# Patient Record
Sex: Male | Born: 1935 | Hispanic: No | Marital: Married | Smoking: Former smoker
Health system: Southern US, Community
[De-identification: ages and names within clinical notes are randomized; demographics above are authoritative.]

## PROBLEM LIST (undated history)

## (undated) DIAGNOSIS — N189 Chronic kidney disease, unspecified: Secondary | ICD-10-CM

## (undated) DIAGNOSIS — Z9289 Personal history of other medical treatment: Secondary | ICD-10-CM

## (undated) DIAGNOSIS — C801 Malignant (primary) neoplasm, unspecified: Secondary | ICD-10-CM

## (undated) DIAGNOSIS — I219 Acute myocardial infarction, unspecified: Secondary | ICD-10-CM

## (undated) DIAGNOSIS — Z8619 Personal history of other infectious and parasitic diseases: Secondary | ICD-10-CM

## (undated) DIAGNOSIS — E119 Type 2 diabetes mellitus without complications: Secondary | ICD-10-CM

## (undated) DIAGNOSIS — I739 Peripheral vascular disease, unspecified: Secondary | ICD-10-CM

## (undated) DIAGNOSIS — M109 Gout, unspecified: Secondary | ICD-10-CM

## (undated) DIAGNOSIS — I5189 Other ill-defined heart diseases: Secondary | ICD-10-CM

## (undated) DIAGNOSIS — K219 Gastro-esophageal reflux disease without esophagitis: Secondary | ICD-10-CM

## (undated) DIAGNOSIS — N183 Chronic kidney disease, stage 3 unspecified: Secondary | ICD-10-CM

## (undated) DIAGNOSIS — E785 Hyperlipidemia, unspecified: Secondary | ICD-10-CM

## (undated) DIAGNOSIS — R55 Syncope and collapse: Secondary | ICD-10-CM

## (undated) DIAGNOSIS — T7840XA Allergy, unspecified, initial encounter: Secondary | ICD-10-CM

## (undated) DIAGNOSIS — G5600 Carpal tunnel syndrome, unspecified upper limb: Secondary | ICD-10-CM

## (undated) DIAGNOSIS — F03A0 Unspecified dementia, mild, without behavioral disturbance, psychotic disturbance, mood disturbance, and anxiety: Secondary | ICD-10-CM

## (undated) DIAGNOSIS — F0390 Unspecified dementia without behavioral disturbance: Secondary | ICD-10-CM

## (undated) DIAGNOSIS — M1990 Unspecified osteoarthritis, unspecified site: Secondary | ICD-10-CM

## (undated) DIAGNOSIS — C61 Malignant neoplasm of prostate: Secondary | ICD-10-CM

## (undated) DIAGNOSIS — D649 Anemia, unspecified: Secondary | ICD-10-CM

## (undated) DIAGNOSIS — I10 Essential (primary) hypertension: Secondary | ICD-10-CM

## (undated) DIAGNOSIS — K5792 Diverticulitis of intestine, part unspecified, without perforation or abscess without bleeding: Secondary | ICD-10-CM

## (undated) DIAGNOSIS — I2510 Atherosclerotic heart disease of native coronary artery without angina pectoris: Secondary | ICD-10-CM

## (undated) HISTORY — DX: Syncope and collapse: R55

## (undated) HISTORY — DX: Diverticulitis of intestine, part unspecified, without perforation or abscess without bleeding: K57.92

## (undated) HISTORY — DX: Other ill-defined heart diseases: I51.89

## (undated) HISTORY — PX: OTHER SURGICAL HISTORY: SHX169

## (undated) HISTORY — DX: Allergy, unspecified, initial encounter: T78.40XA

## (undated) HISTORY — PX: MENISCUS REPAIR: SHX5179

## (undated) HISTORY — DX: Personal history of other medical treatment: Z92.89

## (undated) HISTORY — DX: Atherosclerotic heart disease of native coronary artery without angina pectoris: I25.10

## (undated) HISTORY — DX: Anemia, unspecified: D64.9

## (undated) HISTORY — PX: CARPAL TUNNEL RELEASE: SHX101

## (undated) HISTORY — DX: Chronic kidney disease, stage 3 unspecified: N18.30

## (undated) HISTORY — DX: Chronic kidney disease, unspecified: N18.9

## (undated) HISTORY — DX: Unspecified osteoarthritis, unspecified site: M19.90

## (undated) HISTORY — DX: Peripheral vascular disease, unspecified: I73.9

## (undated) HISTORY — PX: TONSILLECTOMY: SHX5217

## (undated) HISTORY — DX: Carpal tunnel syndrome, unspecified upper limb: G56.00

## (undated) HISTORY — DX: Malignant neoplasm of prostate: C61

## (undated) HISTORY — DX: Acute myocardial infarction, unspecified: I21.9

## (undated) HISTORY — PX: APPENDECTOMY: SHX54

## (undated) HISTORY — PX: ANTERIOR CRUCIATE LIGAMENT REPAIR: SHX115

## (undated) HISTORY — DX: Personal history of other infectious and parasitic diseases: Z86.19

## (undated) NOTE — *Deleted (*Deleted)
Stroke Discharge Summary  Patient ID: Nazar Kuan   MRN: [id number]      DOB: [birth year]  Date of Admission: 12/30/2019 Date of Discharge: 01/07/2020  Attending Physician:  Micki Riley, MD, Stroke MD Consultant(s):   Genevive Bi, MD (  cardiology ), Helmut Muster RN (WOC) Patient's PCP:  Lynnda Child, MD  DISCHARGE DIAGNOSIS:  Principal Problem:   Acute ischemic stroke (HCC) R MCA w/ R M1 occlusion s/p tPA, embolic vs large vessel disease source Active Problems:   Hypertension   DM (diabetes mellitus) type II controlled with renal manifestation (HCC)   Chronic kidney disease, stage 3b (HCC)   Dementia (HCC)   Bradycardia   Peripheral neuropathy   Hyperlipidemia   Hx of myocardial infarction   Hx of malignant neoplasm of prostate   Chronic pain syndrome   Coronary artery disease involving native coronary artery of native heart without angina pectoris   Peripheral vascular disease, unspecified (HCC)   CAD in native artery   Elevated troponin level   DNR (do not resuscitate) discussion   Anemia due to stage 3b chronic kidney disease (HCC)   Necrotic toes (HCC)   Atherosclerosis of native arteries of extremities with gangrene, bilateral legs (HCC)   Chronic constipation   Pressure injury of skin   Allergies as of 01/07/2020       Reactions   Penicillins Anaphylaxis   Reported airway compromise 50 years        Medication List     STOP taking these medications    clopidogrel 75 MG tablet Commonly known as: PLAVIX   hydrochlorothiazide 12.5 MG tablet Commonly known as: HYDRODIURIL   losartan 100 MG tablet Commonly known as: COZAAR   oxyCODONE-acetaminophen 5-325 MG tablet Commonly known as: PERCOCET/ROXICET       TAKE these medications    alendronate 70 MG tablet Commonly known as: FOSAMAX Take 70 mg by mouth every Saturday.   allopurinol 100 MG tablet Commonly known as: ZYLOPRIM TAKE 1 TABLET BY MOUTH EVERY DAY   amLODipine 10 MG  tablet Commonly known as: NORVASC TAKE 1 TABLET BY MOUTH EVERY DAY   aspirin EC 81 MG tablet Take 81 mg by mouth every evening.   atorvastatin 80 MG tablet Commonly known as: LIPITOR Take 0.5 tablets (40 mg total) by mouth daily. What changed: how much to take   carvedilol 6.25 MG tablet Commonly known as: COREG Take 1 tablet (6.25 mg total) by mouth 2 (two) times daily with a meal. What changed:   medication strength  how much to take   donepezil 10 MG tablet Commonly known as: ARICEPT Take 10 mg by mouth daily.   doxazosin 4 MG tablet Commonly known as: CARDURA TAKE 1 TABLET (4 MG TOTAL) BY MOUTH AT BEDTIME. What changed: See the new instructions.   DULoxetine 30 MG capsule Commonly known as: CYMBALTA Take 1 capsule (30 mg total) by mouth daily.   ferrous sulfate 325 (65 FE) MG tablet TAKE 1 TABLET BY MOUTH EVERY DAY   finasteride 5 MG tablet Commonly known as: PROSCAR Take 1 tablet (5 mg total) by mouth daily.   gabapentin 300 MG capsule Commonly known as: NEURONTIN Take 1 capsule (300 mg total) by mouth 2 (two) times daily.   hydrALAZINE 50 MG tablet Commonly known as: APRESOLINE TAKE 1 TABLET BY MOUTH EVERY 8 HOURS What changed: when to take this   Insulin Syringe-Needle U-100 31G X 5/16" 1 ML Misc Commonly known  as: TRUEplus Insulin Syringe Use daily as directed. Dispense needles as prescribed in the past. 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   Lantus 100 UNIT/ML injection Generic drug: insulin glargine Inject 20 Units into the skin at bedtime as needed (for high blood sugar).   memantine 10 MG tablet Commonly known as: NAMENDA TAKE 1 TABLET BY MOUTH TWICE A DAY What changed: when to take this   nitroGLYCERIN 0.4 MG SL tablet Commonly known as: NITROSTAT Place 1 tablet (0.4 mg total) under the tongue every 5 (five) minutes x 3 doses as needed for chest  pain.   ticagrelor 90 MG Tabs tablet Commonly known as: BRILINTA Take 1 tablet (90 mg total) by mouth 2 (two) times daily.               Discharge Care Instructions  (From admission, onward)           Start     Ordered   01/05/20 0000  Discharge wound care:       Comments: Wound care  Daily      Comments: Place a small piece of Xeroform gauze [place] [phone]) between the right lateral toe and the right middle toe. Place dry gauze around the middle toe. Secure with kerlex.   Wound care  Daily      Comments: Cleanse gluteal fold, pat dry. Cut a narrow strip of Aquacel [place] [id number]) and lay in the gluteal fold. Cover with a sacral foam dressing. Change the Aquacel strip daily and prn.  The foam dressing can be used up to 3 days.   01/05/20 1532            LABORATORY STUDIES CBC    Component Value Date/Time   WBC 9.3 01/04/2020 0249   RBC 3.08 (L) 01/04/2020 0249   HGB 8.2 (L) 01/04/2020 0249   HCT 25.5 (L) 01/04/2020 0249   PLT 255 01/04/2020 0249   MCV 82.8 01/04/2020 0249   MCH 26.6 01/04/2020 0249   MCHC 32.2 01/04/2020 0249   RDW 17.9 (H) 01/04/2020 0249   LYMPHSABS 1.4 01/04/2020 0249   MONOABS 0.8 01/04/2020 0249   EOSABS 0.2 01/04/2020 0249   BASOSABS 0.0 01/04/2020 0249   CMP    Component Value Date/Time   NA 137 01/05/2020 1145   K 4.0 01/05/2020 1145   CL 109 01/05/2020 1145   CO2 20 (L) 01/05/2020 1145   GLUCOSE 241 (H) 01/05/2020 1145   BUN 37 (H) 01/05/2020 1145   CREATININE 1.52 (H) 01/05/2020 1145   CALCIUM 7.8 (L) 01/05/2020 1145   PROT 5.0 (L) 12/30/2019 2315   ALBUMIN 2.6 (L) 12/30/2019 2315   AST 22 12/30/2019 2315   ALT 24 12/30/2019 2315   ALKPHOS 70 12/30/2019 2315   BILITOT 0.3 12/30/2019 2315   GFRNONAA 44 (L) 01/05/2020 1145   GFRAA 52 (L) 11/09/2019 1030   COAGS Lab Results  Component Value Date   INR 1.2 12/30/2019   INR 1.1 11/29/2019   INR 1.1 07/03/2019   Lipid Panel    Component Value Date/Time   CHOL 85  12/31/2019 0318   TRIG 152 (H) 12/31/2019 0318   HDL 44 12/31/2019 0318   CHOLHDL 1.9 12/31/2019 0318   VLDL 30 12/31/2019 0318   LDLCALC 11 12/31/2019 0318   HgbA1C  Lab Results  Component Value Date   HGBA1C 6.0 (H) 12/31/2019   Urinalysis  Component Value Date/Time   COLORURINE YELLOW 12/31/2019 1806   APPEARANCEUR HAZY (A) 12/31/2019 1806   APPEARANCEUR Clear 03/31/2019 1142   LABSPEC 1.032 (H) 12/31/2019 1806   PHURINE 5.0 12/31/2019 1806   GLUCOSEU NEGATIVE 12/31/2019 1806   HGBUR NEGATIVE 12/31/2019 1806   BILIRUBINUR NEGATIVE 12/31/2019 1806   BILIRUBINUR Negative 03/31/2019 1142   KETONESUR NEGATIVE 12/31/2019 1806   PROTEINUR 100 (A) 12/31/2019 1806   UROBILINOGEN 0.2 04/18/2014 1426   NITRITE NEGATIVE 12/31/2019 1806   LEUKOCYTESUR NEGATIVE 12/31/2019 1806   Urine Drug Screen     Component Value Date/Time   LABOPIA NONE DETECTED 12/31/2019 1806   COCAINSCRNUR NONE DETECTED 12/31/2019 1806   LABBENZ NONE DETECTED 12/31/2019 1806   AMPHETMU NONE DETECTED 12/31/2019 1806   THCU NONE DETECTED 12/31/2019 1806   LABBARB NONE DETECTED 12/31/2019 1806    Alcohol Level    Component Value Date/Time   ETH <10 12/30/2019 2315    SIGNIFICANT DIAGNOSTIC STUDIES  CT HEAD CODE STROKE WO CONTRAST 12/30/2019 1. No acute intracranial infarct or other abnormality.  2. ASPECTS is 10.  3. Age-appropriate cerebral atrophy with mild chronic small vessel ischemic disease.    CT Code Stroke CTA Head W/WO contrast CT Code Stroke CTA Neck W/WO contrast 12/31/2019 1. Negative CTA for emergent large vessel occlusion.  2. Approximate 8-9 mm severe near occlusive stenosis of the proximal right M1 segment. Right MCA branches attenuated but patent distally with no visible downstream occlusion.  3. Additional severe proximal right A1 stenosis.  4. Atheromatous change about the carotid bifurcations with associated stenoses of up to 50% on the right and 45% on the left.  5.  Atheromatous change about the right carotid siphon with associated moderate to severe multifocal stenoses.  6. Small amount of secretions within the proximal right mainstem bronchus, suggesting that this patient is at risk for aspiration.    MR BRAIN WO CONTRAST 12/31/2019 1. Patchy and hazy diffusion abnormality involving the right insular cortex, subinsular white matter, and overlying right frontal operculum, consistent with acute right MCA territory ischemia. No associated hemorrhage or mass effect. Finding is consistent with the previously identified severe right M1 stenosis.  2. No other acute intracranial abnormality.  3. Age-related cerebral atrophy with mild chronic small vessel ischemic disease.   CT HEAD WO CONTRAST - post tPA 01/01/20 1. Patchy evolving small volume acute ischemic infarcts involving the right subinsular white matter, adjacent insular cortex, and overlying operculum, corresponding with findings on prior MRI. No interval hemorrhage, mass effect, or other complication. 2. No other new acute intracranial abnormality.   DG Chest 2 View 12/30/2019 1. Stable cardiomegaly. No pulmonary venous congestion.  2. Low lung volumes.    Transthoracic Echocardiogram   1. Left ventricular ejection fraction, by estimation, is 60 to 65%. The left ventricle has normal function. The left ventricle has no regional wall motion abnormalities. There is mild concentric left ventricular hypertrophy. Left ventricular diastolic parameters are consistent with Grade II diastolic dysfunction (pseudonormalization). Elevated left atrial pressure. The E/e' is 18.   2. Right ventricular systolic function is normal. The right ventricular size is normal. Tricuspid regurgitation signal is inadequate for assessing PA pressure.   3. Left atrial size was mildly dilated.   4. The mitral valve is degenerative. No evidence of mitral valve regurgitation. No evidence of mitral stenosis.   5. The aortic valve is  tricuspid. Aortic valve regurgitation is not visualized. No aortic stenosis is present.   6. The inferior vena  cava is normal in size with greater than 50% respiratory variability, suggesting right atrial pressure of 3 mmHg.   ECG - SR rate 70 BPM. (See cardiology reading for complete details)      HISTORY OF PRESENT ILLNESS Ranier Coach is a 31 y.o. male past medical history of coronary artery disease, CKD stage III, dementia, diabetes, diastolic dysfunction, peripheral vascular disease, prostate cancer, history of multiple syncopal episodes, presented to the emergency room for evaluation of sudden onset of slurred speech, difficulty swallowing and left-sided weakness. The patient was scheduled for a GI scoping today, but could not go ahead with the procedure because of severely elevated blood pressures with systolic in the 220s.  He was seen at [facility] emergency room, treated with clonidine and discharged home.  He was at home with last known well at around 9 PM on 12/30/2019 when the family noted that he had slurred speech-which was very different from his baseline speech and also had some left-sided weakness.  EMS was called and also noted some left-sided neglect on double simultaneous stimulation.  His blood pressures in route were systolics in the 80s and diastolics in the 40s with a heart rate fluctuating between 30-80. Code stroke was activated in the field.  Patient was brought in and evaluated in the ED.  Initial examination did reveal an NIH stroke scale of 6 with some left arm drift, left facial weakness, dysarthria as well as extinction on double simultaneous stimulation but the symptoms improved as the patient got his CT and CTA.  He had no drift in his left arm.  After the MRI his symptoms started to recur with left arm drift.  He had extinction initially which improved and then recurred.  The CT head showed a near occlusive stenosis of the right M1 segment of the MCA.  He was  sent in for a stat MRI because of symptoms that started to improve, and he was still within the window for IV TPA.  His modified Rankin score is a 4 at baseline and he would not be a candidate for intervention. MRI shows acute to hyperacute right MCA territory infarct without consistent flair hyperintensity.  Initially decision made not to TPA due to rapidly improving symptoms.  But then due to fluctuating symptoms, Dr. Wilford Corner discussed in extensive detail, the risks and benefits of IV TPA with his daughter-before making a decision with her consent to give IV TPA. He required blood pressure treated with Cleviprex before he was able to receive TPA.   Patient denies any chest pain.  His troponins were mildly elevated at [facility] this morning.  Family is concerned that there might be some sort of cardiac event going on with a stroke as well. Family also notes that he has had stroke/TIA-like symptoms during last time when he was preparing for his colonoscopy and was dehydrated.  1 of those times he had slurred speech and left-sided weakness as well.  They cannot be sure that all the symptoms each time were left-sided only.  He is on Plavix at home-which he had discontinued for 5 days as he was preparing for the GI procedure.    HOSPITAL COURSE Mr. Ayub Kirsh is a 65 y.o. male with history of coronary artery disease, CKD stage III, dementia, diabetes, diastolic dysfunction, peripheral vascular disease, prostate cancer, history of multiple syncopal episodes, presented to the emergency room for evaluation of sudden onset of slurred speech, difficulty swallowing and left-sided weakness. He was scheduled for a GI  scoping today, but could not go ahead with the procedure because of severely elevated blood pressures with systolic in the 220s.  His Plavix was on hold for procedure.  The patient received IV t-PA Friday 12/31/19 at 0045.   Stroke: acute right MCA pachy infarcts with right M1 occlusion s/p  tPA felt to be secondary to - cardioembolic vs. Large vessel disease source  CT Head - No acute intracranial infarct or other abnormality. ASPECTS is 10.   CTA H&N - Approximate 8-9 mm severe near occlusive stenosis of the proximal right M1 segment. Right MCA branches attenuated but patent distally with no visible downstream occlusion. Additional severe proximal right A1 stenosis. ICA stenosis up to 50% on the right and 45% on the left. Atheromatous change about the right carotid siphon with associated moderate to severe multifocal stenoses.  MRI head - Patchy and hazy diffusion abnormality involving the right insular cortex, subinsular white matter, and overlying right frontal operculum, consistent with acute right MCA territory ischemia. No associated hemorrhage or mass effect. Finding is consistent with the previously identified severe right M1 stenosis.   CT head repeat 24h post tPA - Patchy evolving small volume acute ischemic infarcts involving the right subinsular white matter, adjacent insular cortex, and overlying operculum, corresponding with findings on prior MRI. No interval hemorrhagerep  Repeat CT head 11/25 pending   2D Echo - EF 60 - 65%. No cardiac source of emboli identified.   30 day cardiac event monitoring as outpt to rule out afib - ordered by cardiology  Loyal Jacobson Virus 2 - negative  LDL - 11  HgbA1c - 6.0  UDS neg  aspirin 81 mg daily and clopidogrel 75 mg daily (Plavix held 5 days for colonoscopy, had cardiac stent 5/21, needs x 1 yr) prior to admission, Aspirin 81 mg daily and Plavix 75 mg daily restarted 01/01/20, changed plavix to brillinta. Continue aspirin 81mg  and brillinta for 3 months, then discontinue aspirin and continue only with brillinta.    Therapy recommendations:  HH PT, HH OT, HH SLP  w/ 24/7 care (active w/ HH PT PTA, added OT and SLP)  Disposition:  return home   CAD s/p stent 5/21 Elevated troponin  Troponin 35->50->215>209  EKG no  ST-T change  TTE - EF 60 - 65%. No cardiac source of emboli identified.   Cardiology on board  On DAPT PTA  CT head repeat 24h post tPA 11/20 2:25 - Patchy evolving small volume acute ischemic infarcts involving the right subinsular white matter, adjacent insular cortex, and overlying operculum, corresponding with findings on prior MRI. No interval hemorrhage  On DAPT as above   Hypertensive Urgency Bradycardia on BB  Home BP meds: Cozaar ; amlodipine ; HCTZ ; Coreg ; hydralazine ; Cardura  Treated with cleviprex in ICU  Aggressive BP mgmt in hospital - stopped losartan and HCTZ d/t renal fx. Added clonidine and increased hydralaizne  BP stable this am  Pt w/ recurrent L sided weakness and slurred speech w/ BPs even in the 140s and bradycardia on BB on 11/24 and again on 11/25  Will d/c clonidine started in hospital and continue home hydralazine, cardura and coreg  Long-term BP goal normotensive   Hyperlipidemia  Home Lipid lowering medication: Lipitor 80 mg daily  LDL 11, goal < 70  Current lipid lowering medication: decrease to lipitor 40   Continue statin at discharge   Diabetes  Home diabetic meds: lantus 20 q hs prn  Current diabetic meds: SSI  HgbA1c 6.0, goal < 7.0  Controlled  CBG monitoring  Add lantus 10 q hs  Follow up with PCP   Other Stroke Risk Factors  Advanced age  Former cigarette smoker - quit  ETOH use, advised to drink no more than 1 alcoholic beverage per day.  Hx stroke/TIA  Coronary artery disease s/p orbital atherectomy and DES 5/21. Put on DAPT x 12 mo   Dysphagia   Speech on board  Passed MBS  On dys 3 and thin  Gentle hydration   Other Active Problems  Code status - DNR   Dementia on Aricept and nemanda - continue   Hx of multiple syncopal episode  CKD - stage 3a - Creatinine - 1.30->1.47->1.69->2.30->1.86  necrotic R foot 2nd toe w/o planned amputation per podiatry, [facility] 12/2019. dsg changes daily   Anemia of chronic disease - Hgb - 8.2  Liquid stool reported - C-diff quick screen ordered 01/01/20 - negatvie - stopped enteric isolation  Hypocalcemia - Calcium - 7.7  DISCHARGE EXAM Blood pressure (!) 178/54, pulse 64, temperature 98.9 F (37.2 C), temperature source Oral, resp. rate 15, height 5\' 7"  (1.702 m), weight 72.5 kg, SpO2 96 %. General - Well nourished, well developed, in no apparent distress.   Ophthalmologic - fundi not visualized due to noncooperation.   Cardiovascular - Regular rhythm and rate.   Mental Status -  Level of arousal and orientation to time, place, and person were intact.  There is mild to moderate dysarthria noted. Language including expression, naming, repetition, comprehension was assessed and found intact.   Cranial Nerves II - XII - II - Visual field intact OU. III, IV, VI - Extraocular movements intact. V - Facial sensation intact bilaterally. VII - mild left facial droop. VIII - Hearing & vestibular intact bilaterally. X - Palate elevates symmetrically. XI - Chin turning & shoulder shrug intact bilaterally. XII - Tongue protrusion intact.   Motor Strength - The patient's strength was normal in all extremities except mild left UE pronator drift.  There is clawhand deformity of the hands bilaterally more profound on the right side Likely from prior ulnar nerve damage at the elbow.  There is marked global atrophy of the intrinsic hand muscles on the right and mild on the left.  These are associated with weakness of the hand muscles bilaterally. Motor Tone - Muscle tone was assessed at the neck and was normal.   Reflexes - The patient's reflexes were symmetrical in all extremities and he had no pathological reflexes.   Sensory - Light touch, temperature/pinprick were assessed and were symmetrical.     Coordination - The patient had normal movements in the hands with no ataxia or dysmetria on right side. Mild left sided ataxia.   Gait and Station -  deferred.   Discharge Diet   Regular diet nectar thick liquids  DISCHARGE PLAN  Disposition:  Return home  Resume Home health PT, added HH OT, SLP  aspirin 81 mg daily and Brilinta (ticagrelor) 90 mg bid for secondary stroke prevention for 3 months then BRILINTA alone.  Ongoing stroke risk factor control by Primary Care Physician at time of discharge  Follow-up PCP Lynnda Child, MD in 2 weeks.  Follow-up in [facility] in 4 weeks, office to schedule an appointment.   45 minutes were spent preparing discharge.  Annie Main, MSN, APRN, ANVP-BC, AGPCNP-BC Advanced Practice Stroke Nurse [facility] See Amion for Schedule & Pager information 01/07/2020 8:45 AM  I have personally obtained history,examined this patient, reviewed notes, independently viewed imaging studies, participated in medical decision making and plan of care.ROS completed by me personally and pertinent positives fully documented  I have made any additions or clarifications directly to the above note. Agree with note above.    Delia Heady, MD Medical Director [facility] Pager: [phone] 01/07/2020 10:05 AM

## (undated) NOTE — *Deleted (*Deleted)
Stroke Discharge Summary  Patient ID: Peter [name]   MRN: [id number]      DOB: 29-Nov-1930  Date of Admission: 12/30/2019 Date of Discharge: 01/06/2020  Attending Physician:  Micki Riley, MD, Stroke MD Consultant(s):   Genevive Bi, MD (  cardiology ), Helmut Muster RN (WOC) Patient's PCP:  Lynnda Child, MD  DISCHARGE DIAGNOSIS:  Principal Problem:   Acute ischemic stroke (HCC) R MCA w/ R M1 occlusion s/p tPA, embolic vs large vessel disease source Active Problems:   Hypertension   DM (diabetes mellitus) type II controlled with renal manifestation (HCC)   Chronic kidney disease, stage 3b (HCC)   Dementia (HCC)   Bradycardia   Peripheral neuropathy   Hyperlipidemia   Hx of myocardial infarction   Hx of malignant neoplasm of prostate   Chronic pain syndrome   Coronary artery disease involving native coronary artery of native heart without angina pectoris   Peripheral vascular disease, unspecified (HCC)   CAD in native artery   Elevated troponin level   DNR (do not resuscitate) discussion   Anemia due to stage 3b chronic kidney disease (HCC)   Necrotic toes (HCC)   Atherosclerosis of native arteries of extremities with gangrene, bilateral legs (HCC)   Chronic constipation   Pressure injury of skin   Allergies as of 01/06/2020      Reactions   Penicillins Anaphylaxis   Reported airway compromise 50 years      Medication List    STOP taking these medications   clopidogrel 75 MG tablet Commonly known as: PLAVIX   hydrochlorothiazide 12.5 MG tablet Commonly known as: HYDRODIURIL   losartan 100 MG tablet Commonly known as: COZAAR   oxyCODONE-acetaminophen 5-325 MG tablet Commonly known as: PERCOCET/ROXICET     TAKE these medications   alendronate 70 MG tablet Commonly known as: FOSAMAX Take 70 mg by mouth every Saturday.   allopurinol 100 MG tablet Commonly known as: ZYLOPRIM TAKE 1 TABLET BY MOUTH EVERY DAY   amLODipine 10 MG tablet Commonly  known as: NORVASC TAKE 1 TABLET BY MOUTH EVERY DAY   aspirin EC 81 MG tablet Take 81 mg by mouth every evening.   atorvastatin 80 MG tablet Commonly known as: LIPITOR Take 0.5 tablets (40 mg total) by mouth daily. What changed: how much to take   carvedilol 6.25 MG tablet Commonly known as: COREG Take 1 tablet (6.25 mg total) by mouth 2 (two) times daily with a meal. What changed:   medication strength  how much to take   donepezil 10 MG tablet Commonly known as: ARICEPT Take 10 mg by mouth daily.   doxazosin 4 MG tablet Commonly known as: CARDURA TAKE 1 TABLET (4 MG TOTAL) BY MOUTH AT BEDTIME. What changed: See the new instructions.   DULoxetine 30 MG capsule Commonly known as: CYMBALTA Take 1 capsule (30 mg total) by mouth daily.   ferrous sulfate 325 (65 FE) MG tablet TAKE 1 TABLET BY MOUTH EVERY DAY   finasteride 5 MG tablet Commonly known as: PROSCAR Take 1 tablet (5 mg total) by mouth daily.   gabapentin 300 MG capsule Commonly known as: NEURONTIN Take 1 capsule (300 mg total) by mouth 2 (two) times daily.   hydrALAZINE 50 MG tablet Commonly known as: APRESOLINE TAKE 1 TABLET BY MOUTH EVERY 8 HOURS What changed: when to take this   Insulin Syringe-Needle U-100 31G X 5/16" 1 ML Misc Commonly known as: TRUEplus Insulin Syringe Use daily as directed.  Dispense needles as prescribed in the past. 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   Lantus 100 UNIT/ML injection Generic drug: insulin glargine Inject 20 Units into the skin at bedtime as needed (for high blood sugar).   memantine 10 MG tablet Commonly known as: NAMENDA TAKE 1 TABLET BY MOUTH TWICE A DAY What changed: when to take this   nitroGLYCERIN 0.4 MG SL tablet Commonly known as: NITROSTAT Place 1 tablet (0.4 mg total) under the tongue every 5 (five) minutes x 3 doses as needed for chest pain.   ticagrelor  90 MG Tabs tablet Commonly known as: BRILINTA Take 1 tablet (90 mg total) by mouth 2 (two) times daily.            Discharge Care Instructions  (From admission, onward)         Start     Ordered   01/05/20 0000  Discharge wound care:       Comments: Wound care  Daily      Comments: Place a small piece of Xeroform gauze [place] [id number]) between the right lateral toe and the right middle toe. Place dry gauze around the middle toe. Secure with kerlex.   Wound care  Daily      Comments: Cleanse gluteal fold, pat dry. Cut a narrow strip of Aquacel [place] [id number]) and lay in the gluteal fold. Cover with a sacral foam dressing. Change the Aquacel strip daily and prn.  The foam dressing can be used up to 3 days.   01/05/20 1532          LABORATORY STUDIES CBC    Component Value Date/Time   WBC 9.3 01/04/2020 0249   RBC 3.08 (L) 01/04/2020 0249   HGB 8.2 (L) 01/04/2020 0249   HCT 25.5 (L) 01/04/2020 0249   PLT 255 01/04/2020 0249   MCV 82.8 01/04/2020 0249   MCH 26.6 01/04/2020 0249   MCHC 32.2 01/04/2020 0249   RDW 17.9 (H) 01/04/2020 0249   LYMPHSABS 1.4 01/04/2020 0249   MONOABS 0.8 01/04/2020 0249   EOSABS 0.2 01/04/2020 0249   BASOSABS 0.0 01/04/2020 0249   CMP    Component Value Date/Time   NA 137 01/05/2020 1145   K 4.0 01/05/2020 1145   CL 109 01/05/2020 1145   CO2 20 (L) 01/05/2020 1145   GLUCOSE 241 (H) 01/05/2020 1145   BUN 37 (H) 01/05/2020 1145   CREATININE 1.52 (H) 01/05/2020 1145   CALCIUM 7.8 (L) 01/05/2020 1145   PROT 5.0 (L) 12/30/2019 2315   ALBUMIN 2.6 (L) 12/30/2019 2315   AST 22 12/30/2019 2315   ALT 24 12/30/2019 2315   ALKPHOS 70 12/30/2019 2315   BILITOT 0.3 12/30/2019 2315   GFRNONAA 44 (L) 01/05/2020 1145   GFRAA 52 (L) 11/09/2019 1030   COAGS Lab Results  Component Value Date   INR 1.2 12/30/2019   INR 1.1 11/29/2019   INR 1.1 07/03/2019   Lipid Panel    Component Value Date/Time   CHOL 85 12/31/2019 0318   TRIG 152 (H)  12/31/2019 0318   HDL 44 12/31/2019 0318   CHOLHDL 1.9 12/31/2019 0318   VLDL 30 12/31/2019 0318   LDLCALC 11 12/31/2019 0318   HgbA1C  Lab Results  Component Value Date   HGBA1C 6.0 (H) 12/31/2019   Urinalysis    Component Value Date/Time   COLORURINE YELLOW 12/31/2019 1806   APPEARANCEUR  HAZY (A) 12/31/2019 1806   APPEARANCEUR Clear 03/31/2019 1142   LABSPEC 1.032 (H) 12/31/2019 1806   PHURINE 5.0 12/31/2019 1806   GLUCOSEU NEGATIVE 12/31/2019 1806   HGBUR NEGATIVE 12/31/2019 1806   BILIRUBINUR NEGATIVE 12/31/2019 1806   BILIRUBINUR Negative 03/31/2019 1142   KETONESUR NEGATIVE 12/31/2019 1806   PROTEINUR 100 (A) 12/31/2019 1806   UROBILINOGEN 0.2 04/18/2014 1426   NITRITE NEGATIVE 12/31/2019 1806   LEUKOCYTESUR NEGATIVE 12/31/2019 1806   Urine Drug Screen     Component Value Date/Time   LABOPIA NONE DETECTED 12/31/2019 1806   COCAINSCRNUR NONE DETECTED 12/31/2019 1806   LABBENZ NONE DETECTED 12/31/2019 1806   AMPHETMU NONE DETECTED 12/31/2019 1806   THCU NONE DETECTED 12/31/2019 1806   LABBARB NONE DETECTED 12/31/2019 1806    Alcohol Level    Component Value Date/Time   ETH <10 12/30/2019 2315    SIGNIFICANT DIAGNOSTIC STUDIES  CT HEAD CODE STROKE WO CONTRAST 12/30/2019 1. No acute intracranial infarct or other abnormality.  2. ASPECTS is 10.  3. Age-appropriate cerebral atrophy with mild chronic small vessel ischemic disease.   CT Code Stroke CTA Head W/WO contrast CT Code Stroke CTA Neck W/WO contrast 12/31/2019 1. Negative CTA for emergent large vessel occlusion.  2. Approximate 8-9 mm severe near occlusive stenosis of the proximal right M1 segment. Right MCA branches attenuated but patent distally with no visible downstream occlusion.  3. Additional severe proximal right A1 stenosis.  4. Atheromatous change about the carotid bifurcations with associated stenoses of up to 50% on the right and 45% on the left.  5. Atheromatous change about the right  carotid siphon with associated moderate to severe multifocal stenoses.  6. Small amount of secretions within the proximal right mainstem bronchus, suggesting that this patient is at risk for aspiration.   MR BRAIN WO CONTRAST 12/31/2019 1. Patchy and hazy diffusion abnormality involving the right insular cortex, subinsular white matter, and overlying right frontal operculum, consistent with acute right MCA territory ischemia. No associated hemorrhage or mass effect. Finding is consistent with the previously identified severe right M1 stenosis.  2. No other acute intracranial abnormality.  3. Age-related cerebral atrophy with mild chronic small vessel ischemic disease.   CT HEAD WO CONTRAST - post tPA 01/01/20 1. Patchy evolving small volume acute ischemic infarcts involving the right subinsular white matter, adjacent insular cortex, and overlying operculum, corresponding with findings on prior MRI. No interval hemorrhage, mass effect, or other complication. 2. No other new acute intracranial abnormality.  DG Chest 2 View 12/30/2019 1. Stable cardiomegaly. No pulmonary venous congestion.  2. Low lung volumes.   Transthoracic Echocardiogram  1. Left ventricular ejection fraction, by estimation, is 60 to 65%. The left ventricle has normal function. The left ventricle has no regional wall motion abnormalities. There is mild concentric left ventricular hypertrophy. Left ventricular diastolic parameters are consistent with Grade II diastolic dysfunction (pseudonormalization). Elevated left atrial pressure. The E/e' is 18.  2. Right ventricular systolic function is normal. The right ventricular size is normal. Tricuspid regurgitation signal is inadequate for assessing PA pressure.  3. Left atrial size was mildly dilated.  4. The mitral valve is degenerative. No evidence of mitral valve regurgitation. No evidence of mitral stenosis.  5. The aortic valve is tricuspid. Aortic valve  regurgitation is not visualized. No aortic stenosis is present.  6. The inferior vena cava is normal in size with greater than 50% respiratory variability, suggesting right atrial pressure of 3 mmHg.  ECG - SR  rate 70 BPM. (See cardiology reading for complete details)     HISTORY OF PRESENT ILLNESS Quinterrius Errington is a 17 y.o. male past medical history of coronary artery disease, CKD stage III, dementia, diabetes, diastolic dysfunction, peripheral vascular disease, prostate cancer, history of multiple syncopal episodes, presented to the emergency room for evaluation of sudden onset of slurred speech, difficulty swallowing and left-sided weakness. The patient was scheduled for a GI scoping today, but could not go ahead with the procedure because of severely elevated blood pressures with systolic in the 220s.  He was seen at [facility] emergency room, treated with clonidine and discharged home.  He was at home with last known well at around 9 PM on 12/30/2019 when the family noted that he had slurred speech-which was very different from his baseline speech and also had some left-sided weakness.  EMS was called and also noted some left-sided neglect on double simultaneous stimulation.  His blood pressures in route were systolics in the 80s and diastolics in the 40s with a heart rate fluctuating between 30-80. Code stroke was activated in the field.  Patient was brought in and evaluated in the ED.  Initial examination did reveal an NIH stroke scale of 6 with some left arm drift, left facial weakness, dysarthria as well as extinction on double simultaneous stimulation but the symptoms improved as the patient got his CT and CTA.  He had no drift in his left arm.  After the MRI his symptoms started to recur with left arm drift.  He had extinction initially which improved and then recurred.  The CT head showed a near occlusive stenosis of the right M1 segment of the MCA.  He was sent in for a stat MRI  because of symptoms that started to improve, and he was still within the window for IV TPA.  His modified Rankin score is a 4 at baseline and he would not be a candidate for intervention. MRI shows acute to hyperacute right MCA territory infarct without consistent flair hyperintensity.  Initially decision made not to TPA due to rapidly improving symptoms.  But then due to fluctuating symptoms, Dr. Wilford Corner discussed in extensive detail, the risks and benefits of IV TPA with his daughter-before making a decision with her consent to give IV TPA. He required blood pressure treated with Cleviprex before he was able to receive TPA.  Patient denies any chest pain.  His troponins were mildly elevated at [place] this morning.  Family is concerned that there might be some sort of cardiac event going on with a stroke as well. Family also notes that he has had stroke/TIA-like symptoms during last time when he was preparing for his colonoscopy and was dehydrated.  1 of those times he had slurred speech and left-sided weakness as well.  They cannot be sure that all the symptoms each time were left-sided only.  He is on Plavix at home-which he had discontinued for 5 days as he was preparing for the GI procedure.   HOSPITAL COURSE Mr. Leshaun Biebel is a 36 y.o. male with history of coronary artery disease, CKD stage III, dementia, diabetes, diastolic dysfunction, peripheral vascular disease, prostate cancer, history of multiple syncopal episodes, presented to the emergency room for evaluation of sudden onset of slurred speech, difficulty swallowing and left-sided weakness. He was scheduled for a GI scoping today, but could not go ahead with the procedure because of severely elevated blood pressures with systolic in the 220s.  His Plavix was on  hold for procedure.  The patient received IV t-PA Friday 12/31/19 at 0045.  Stroke: acute right MCA pachy infarcts with right M1 occlusion s/p tPA felt to be secondary  to - cardioembolic vs. Large vessel disease source  CT Head - No acute intracranial infarct or other abnormality. ASPECTS is 10.   CTA H&N - Approximate 8-9 mm severe near occlusive stenosis of the proximal right M1 segment. Right MCA branches attenuated but patent distally with no visible downstream occlusion. Additional severe proximal right A1 stenosis. ICA stenosis up to 50% on the right and 45% on the left. Atheromatous change about the right carotid siphon with associated moderate to severe multifocal stenoses.  MRI head - Patchy and hazy diffusion abnormality involving the right insular cortex, subinsular white matter, and overlying right frontal operculum, consistent with acute right MCA territory ischemia. No associated hemorrhage or mass effect. Finding is consistent with the previously identified severe right M1 stenosis.   CT head repeat 24h post tPA - Patchy evolving small volume acute ischemic infarcts involving the right subinsular white matter, adjacent insular cortex, and overlying operculum, corresponding with findings on prior MRI. No interval hemorrhagerep  Repeat CT head 11/25 pending   2D Echo - EF 60 - 65%. No cardiac source of emboli identified.   30 day cardiac event monitoring as outpt to rule out afib - ordered by cardiology  Loyal Jacobson Virus 2 - negative  LDL - 11  HgbA1c - 6.0  UDS neg  aspirin 81 mg daily and clopidogrel 75 mg daily (Plavix held 5 days for colonoscopy, had cardiac stent 5/21, needs x 1 yr) prior to admission, Aspirin 81 mg daily and Plavix 75 mg daily restarted 01/01/20, changed plavix to brillinta. Continue aspirin 81mg  and brillinta for 3 months, then discontinue aspirin and continue only with brillinta.    Therapy recommendations:  HH PT, HH OT, HH SLP  w/ 24/7 care (active w/ HH PT PTA, added OT and SLP)  Disposition:  return home  CAD s/p stent 5/21 Elevated troponin  Troponin 35->50->215>209  EKG no ST-T change  TTE - EF 60 -  65%. No cardiac source of emboli identified.   Cardiology on board  On DAPT PTA  CT head repeat 24h post tPA 11/20 2:25 - Patchy evolving small volume acute ischemic infarcts involving the right subinsular white matter, adjacent insular cortex, and overlying operculum, corresponding with findings on prior MRI. No interval hemorrhage  On DAPT as above  Hypertensive Urgency Bradycardia on BB  Home BP meds: Cozaar ; amlodipine ; HCTZ ; Coreg ; hydralazine ; Cardura  Treated with cleviprex in ICU  Aggressive BP mgmt in hospital - stopped losartan and HCTZ d/t renal fx. Added clonidine and increased hydralaizne  BP stable this am  Pt w/ recurrent L sided weakness and slurred speech w/ BPs even in the 140s and bradycardia on BB on 11/24 and again on 11/25  Will d/c clonidine started in hospital and continue home hydralazine, cardura and coreg  Long-term BP goal normotensive  Hyperlipidemia  Home Lipid lowering medication: Lipitor 80 mg daily  LDL 11, goal < 70  Current lipid lowering medication: decrease to lipitor 40   Continue statin at discharge  Diabetes  Home diabetic meds: lantus 20 q hs prn  Current diabetic meds: SSI   HgbA1c 6.0, goal < 7.0  Controlled  CBG monitoring  Add lantus 10 q hs  Follow up with PCP  Other Stroke Risk Factors  Advanced age  Former cigarette smoker - quit  ETOH use, advised to drink no more than 1 alcoholic beverage per day.  Hx stroke/TIA  Coronary artery disease s/p orbital atherectomy and DES 5/21. Put on DAPT x 12 mo  Dysphagia   Speech on board  Passed MBS  On dys 3 and thin  Gentle hydration  Other Active Problems  Code status - DNR   Dementia on Aricept and nemanda - continue   Hx of multiple syncopal episode  CKD - stage 3a - Creatinine - 1.30->1.47->1.69->2.30->1.86  necrotic R foot 2nd toe w/o planned amputation per podiatry, VVS 12/2019. dsg changes daily  Anemia of chronic disease - Hgb  - 8.2  Liquid stool reported - C-diff quick screen ordered 01/01/20 - negatvie - stopped enteric isolation  Hypocalcemia - Calcium - 7.7  DISCHARGE EXAM Blood pressure (!) 165/50, pulse 64, temperature 98.6 F (37 C), temperature source Oral, resp. rate 18, height 5\' 7"  (1.702 m), weight 72.5 kg, SpO2 100 %. General - Well nourished, well developed, in no apparent distress.  Ophthalmologic - fundi not visualized due to noncooperation.  Cardiovascular - Regular rhythm and rate.  Mental Status -  Level of arousal and orientation to time, place, and person were intact.  There is mild to moderate dysarthria noted. Language including expression, naming, repetition, comprehension was assessed and found intact.  Cranial Nerves II - XII - II - Visual field intact OU. III, IV, VI - Extraocular movements intact. V - Facial sensation intact bilaterally. VII - mild left facial droop. VIII - Hearing & vestibular intact bilaterally. X - Palate elevates symmetrically. XI - Chin turning & shoulder shrug intact bilaterally. XII - Tongue protrusion intact.  Motor Strength - The patient's strength was normal in all extremities except mild left UE pronator drift.  There is clawhand deformity of the hands bilaterally more profound on the right side Likely from prior ulnar nerve damage at the elbow.  There is marked global atrophy of the intrinsic hand muscles on the right and mild on the left.  These are associated with weakness of the hand muscles bilaterally. Motor Tone - Muscle tone was assessed at the neck and was normal.  Reflexes - The patient's reflexes were symmetrical in all extremities and he had no pathological reflexes.  Sensory - Light touch, temperature/pinprick were assessed and were symmetrical.    Coordination - The patient had normal movements in the hands with no ataxia or dysmetria on right side. Mild left sided ataxia.   Gait and Station - deferred.  Discharge Diet    Regular diet nectar thick liquids  DISCHARGE PLAN  Disposition:  Return home  Resume Home health PT, added HH OT, SLP  aspirin 81 mg daily and Brilinta (ticagrelor) 90 mg bid for secondary stroke prevention for 3 months then BRILINTA alone.  Ongoing stroke risk factor control by Primary Care Physician at time of discharge  Follow-up PCP Lynnda Child, MD in 2 weeks.  Follow-up in [facility] in 4 weeks, office to schedule an appointment.   45 minutes were spent preparing discharge.  Annie Main, MSN, APRN, ANVP-BC, AGPCNP-BC Advanced Practice Stroke Nurse [facility] See Amion for Schedule & Pager information 01/06/2020 12:30 PM

---

## 1966-02-11 HISTORY — PX: ABDOMINAL SURGERY: SHX537

## 2006-05-29 LAB — HM COLONOSCOPY

## 2014-04-18 ENCOUNTER — Inpatient Hospital Stay (HOSPITAL_COMMUNITY)
Admission: EM | Admit: 2014-04-18 | Discharge: 2014-04-24 | DRG: 392 | Disposition: A | Discharge status: Home / Self Care | Payer: Medicare Other | Attending: Internal Medicine | Admitting: Internal Medicine

## 2014-04-18 ENCOUNTER — Encounter (HOSPITAL_COMMUNITY): Payer: Self-pay | Admitting: Emergency Medicine

## 2014-04-18 ENCOUNTER — Emergency Department (HOSPITAL_COMMUNITY): Payer: Medicare Other

## 2014-04-18 DIAGNOSIS — T447X5A Adverse effect of beta-adrenoreceptor antagonists, initial encounter: Secondary | ICD-10-CM | POA: Diagnosis present

## 2014-04-18 DIAGNOSIS — K219 Gastro-esophageal reflux disease without esophagitis: Secondary | ICD-10-CM | POA: Diagnosis present

## 2014-04-18 DIAGNOSIS — Z87891 Personal history of nicotine dependence: Secondary | ICD-10-CM | POA: Diagnosis not present

## 2014-04-18 DIAGNOSIS — R001 Bradycardia, unspecified: Secondary | ICD-10-CM | POA: Diagnosis present

## 2014-04-18 DIAGNOSIS — R1110 Vomiting, unspecified: Secondary | ICD-10-CM

## 2014-04-18 DIAGNOSIS — Z794 Long term (current) use of insulin: Secondary | ICD-10-CM | POA: Diagnosis not present

## 2014-04-18 DIAGNOSIS — K5732 Diverticulitis of large intestine without perforation or abscess without bleeding: Secondary | ICD-10-CM | POA: Diagnosis not present

## 2014-04-18 DIAGNOSIS — I10 Essential (primary) hypertension: Secondary | ICD-10-CM | POA: Diagnosis present

## 2014-04-18 DIAGNOSIS — E1122 Type 2 diabetes mellitus with diabetic chronic kidney disease: Secondary | ICD-10-CM | POA: Diagnosis present

## 2014-04-18 DIAGNOSIS — N1832 Chronic kidney disease, stage 3b: Secondary | ICD-10-CM | POA: Diagnosis present

## 2014-04-18 DIAGNOSIS — K5792 Diverticulitis of intestine, part unspecified, without perforation or abscess without bleeding: Secondary | ICD-10-CM | POA: Diagnosis present

## 2014-04-18 DIAGNOSIS — R1032 Left lower quadrant pain: Secondary | ICD-10-CM | POA: Diagnosis not present

## 2014-04-18 DIAGNOSIS — Z79899 Other long term (current) drug therapy: Secondary | ICD-10-CM

## 2014-04-18 DIAGNOSIS — I129 Hypertensive chronic kidney disease with stage 1 through stage 4 chronic kidney disease, or unspecified chronic kidney disease: Secondary | ICD-10-CM | POA: Diagnosis present

## 2014-04-18 DIAGNOSIS — E1129 Type 2 diabetes mellitus with other diabetic kidney complication: Secondary | ICD-10-CM | POA: Diagnosis not present

## 2014-04-18 DIAGNOSIS — R55 Syncope and collapse: Secondary | ICD-10-CM | POA: Diagnosis present

## 2014-04-18 DIAGNOSIS — Z8546 Personal history of malignant neoplasm of prostate: Secondary | ICD-10-CM

## 2014-04-18 DIAGNOSIS — D72829 Elevated white blood cell count, unspecified: Secondary | ICD-10-CM | POA: Diagnosis present

## 2014-04-18 DIAGNOSIS — R109 Unspecified abdominal pain: Secondary | ICD-10-CM

## 2014-04-18 DIAGNOSIS — E785 Hyperlipidemia, unspecified: Secondary | ICD-10-CM | POA: Diagnosis present

## 2014-04-18 DIAGNOSIS — N183 Chronic kidney disease, stage 3 (moderate): Secondary | ICD-10-CM | POA: Diagnosis present

## 2014-04-18 DIAGNOSIS — R112 Nausea with vomiting, unspecified: Secondary | ICD-10-CM | POA: Diagnosis present

## 2014-04-18 DIAGNOSIS — F0390 Unspecified dementia without behavioral disturbance: Secondary | ICD-10-CM | POA: Diagnosis not present

## 2014-04-18 DIAGNOSIS — N058 Unspecified nephritic syndrome with other morphologic changes: Secondary | ICD-10-CM

## 2014-04-18 HISTORY — DX: Malignant (primary) neoplasm, unspecified: C80.1

## 2014-04-18 HISTORY — DX: Hyperlipidemia, unspecified: E78.5

## 2014-04-18 HISTORY — DX: Essential (primary) hypertension: I10

## 2014-04-18 HISTORY — DX: Unspecified dementia without behavioral disturbance: F03.90

## 2014-04-18 HISTORY — DX: Gastro-esophageal reflux disease without esophagitis: K21.9

## 2014-04-18 HISTORY — DX: Unspecified dementia, mild, without behavioral disturbance, psychotic disturbance, mood disturbance, and anxiety: F03.A0

## 2014-04-18 HISTORY — DX: Gout, unspecified: M10.9

## 2014-04-18 HISTORY — DX: Type 2 diabetes mellitus without complications: E11.9

## 2014-04-18 LAB — URINALYSIS, ROUTINE W REFLEX MICROSCOPIC
BILIRUBIN URINE: NEGATIVE
Glucose, UA: NEGATIVE mg/dL
HGB URINE DIPSTICK: NEGATIVE
KETONES UR: NEGATIVE mg/dL
Leukocytes, UA: NEGATIVE
NITRITE: NEGATIVE
PH: 6.5 (ref 5.0–8.0)
Protein, ur: NEGATIVE mg/dL
Specific Gravity, Urine: 1.009 (ref 1.005–1.030)
Urobilinogen, UA: 0.2 mg/dL (ref 0.0–1.0)

## 2014-04-18 LAB — COMPREHENSIVE METABOLIC PANEL
ALT: 24 U/L (ref 0–53)
ANION GAP: 8 (ref 5–15)
AST: 22 U/L (ref 0–37)
Albumin: 4.4 g/dL (ref 3.5–5.2)
Alkaline Phosphatase: 91 U/L (ref 39–117)
BUN: 21 mg/dL (ref 6–23)
CO2: 28 mmol/L (ref 19–32)
CREATININE: 1.59 mg/dL — AB (ref 0.50–1.35)
Calcium: 9 mg/dL (ref 8.4–10.5)
Chloride: 103 mmol/L (ref 96–112)
GFR, EST AFRICAN AMERICAN: 45 mL/min — AB (ref >90)
GFR, EST NON AFRICAN AMERICAN: 39 mL/min — AB (ref >90)
GLUCOSE: 159 mg/dL — AB (ref 70–99)
Potassium: 4.6 mmol/L (ref 3.5–5.1)
Sodium: 139 mmol/L (ref 135–145)
TOTAL PROTEIN: 7.5 g/dL (ref 6.0–8.3)
Total Bilirubin: 1.2 mg/dL (ref 0.3–1.2)

## 2014-04-18 LAB — CBC WITH DIFFERENTIAL/PLATELET
BASOS ABS: 0 10*3/uL (ref 0.0–0.1)
BASOS PCT: 0 % (ref 0–1)
EOS ABS: 0 10*3/uL (ref 0.0–0.7)
Eosinophils Relative: 0 % (ref 0–5)
HCT: 42.7 % (ref 39.0–52.0)
Hemoglobin: 14.6 g/dL (ref 13.0–17.0)
LYMPHS PCT: 4 % — AB (ref 12–46)
Lymphs Abs: 1.1 10*3/uL (ref 0.7–4.0)
MCH: 28.9 pg (ref 26.0–34.0)
MCHC: 34.2 g/dL (ref 30.0–36.0)
MCV: 84.6 fL (ref 78.0–100.0)
MONOS PCT: 5 % (ref 3–12)
Monocytes Absolute: 1.4 10*3/uL — ABNORMAL HIGH (ref 0.1–1.0)
Neutro Abs: 25.9 10*3/uL — ABNORMAL HIGH (ref 1.7–7.7)
Neutrophils Relative %: 91 % — ABNORMAL HIGH (ref 43–77)
Platelets: 309 10*3/uL (ref 150–400)
RBC: 5.05 MIL/uL (ref 4.22–5.81)
RDW: 13.4 % (ref 11.5–15.5)
WBC: 28.4 10*3/uL — AB (ref 4.0–10.5)

## 2014-04-18 LAB — I-STAT TROPONIN, ED: TROPONIN I, POC: 0.01 ng/mL (ref 0.00–0.08)

## 2014-04-18 LAB — TROPONIN I: Troponin I: <0.03 ng/mL (ref <0.031)

## 2014-04-18 LAB — LIPASE, BLOOD: Lipase: 27 U/L (ref 11–59)

## 2014-04-18 LAB — GLUCOSE, CAPILLARY: GLUCOSE-CAPILLARY: 155 mg/dL — AB (ref 70–99)

## 2014-04-18 IMAGING — CT CT ABD-PELV W/O CM
1 of 2 series · 15 of 32 positions shown, 19 images · non-contrast
Comparison: None.

CLINICAL DATA: Left lower quadrant pain for 3 days. nausea and
vomiting. Diarrhea. Chills.

EXAM:
CT ABDOMEN AND PELVIS WITHOUT CONTRAST
TECHNIQUE: Multidetector CT imaging of the abdomen and pelvis was performed
following the standard protocol without IV contrast.

[Series 2: abd/pel w/o · axial · non-contrast · 0.85mm/px · z∈[+1026,+1446]mm · 15 of 93 slices shown, 19 images]
[im 5/93  soft-tissue]
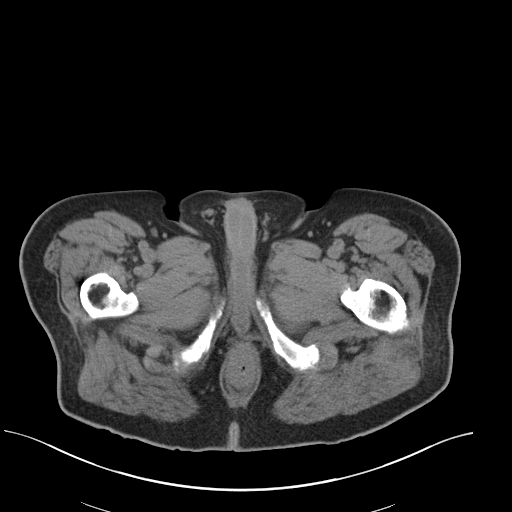
[im 5/93  bone]
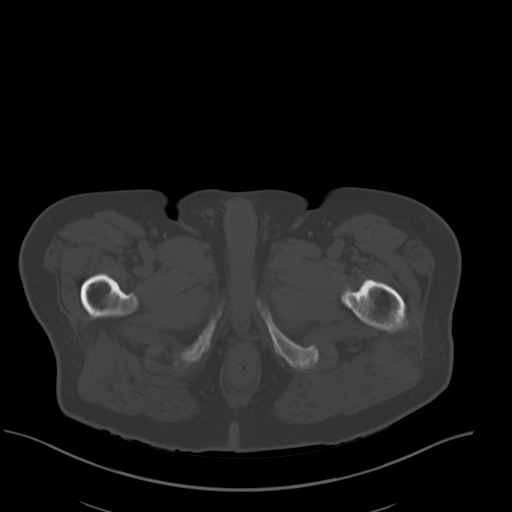
[im 13/93  soft-tissue]
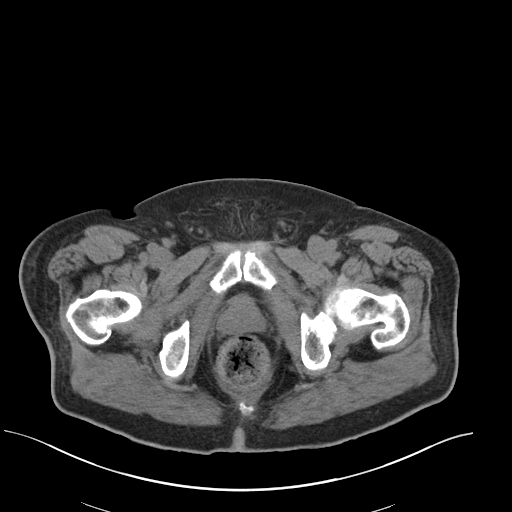
[im 21/93  soft-tissue]
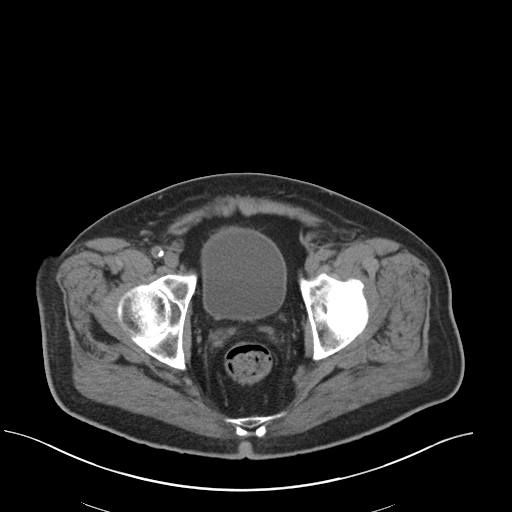
[im 25/93  soft-tissue]
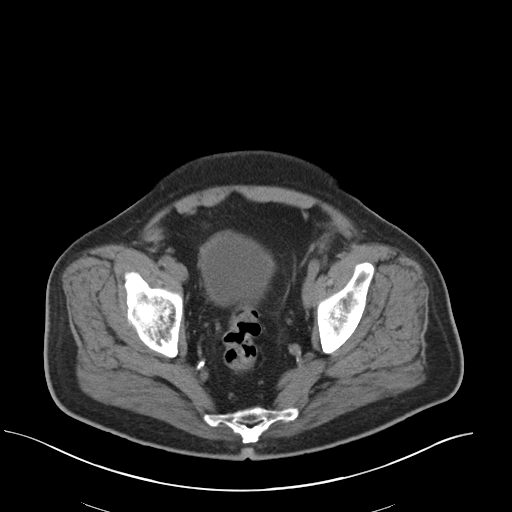
[im 33/93  soft-tissue]
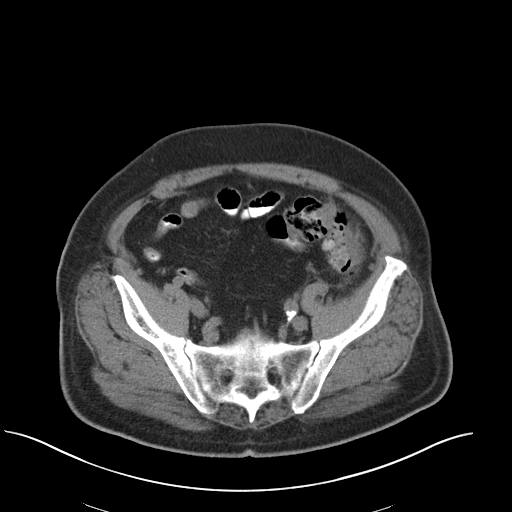
[im 41/93  soft-tissue]
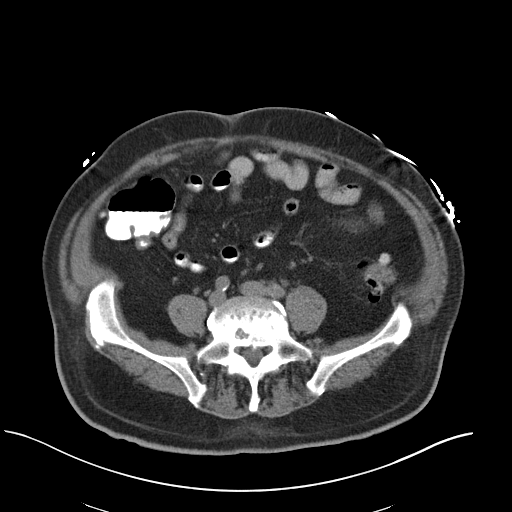
[im 49/93  soft-tissue]
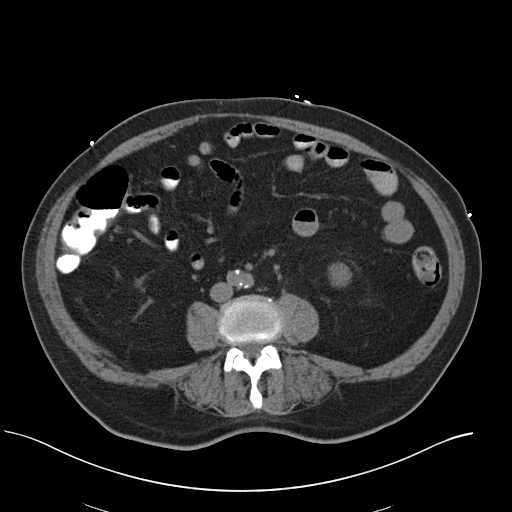
[im 53/93  soft-tissue]
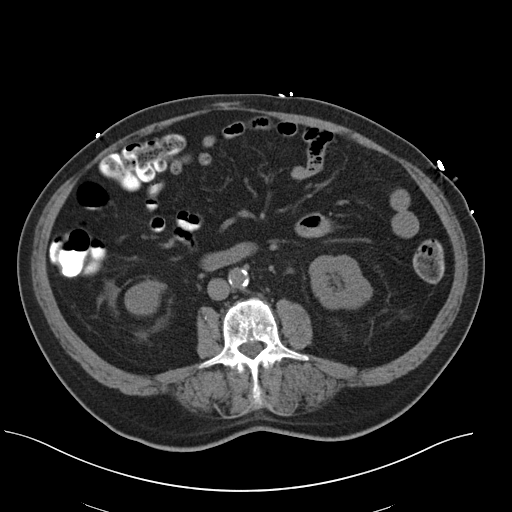
[im 61/93  soft-tissue]
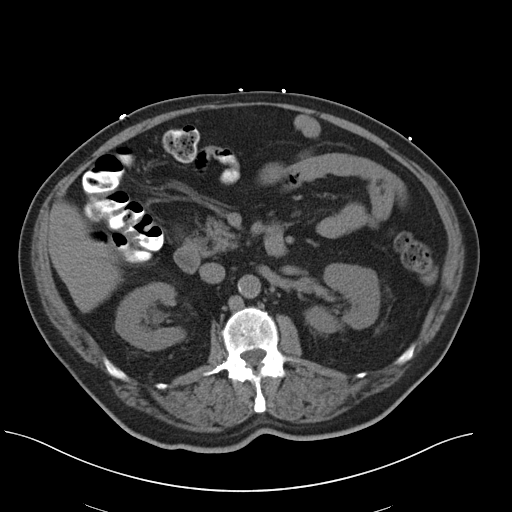
[im 61/93  bone]
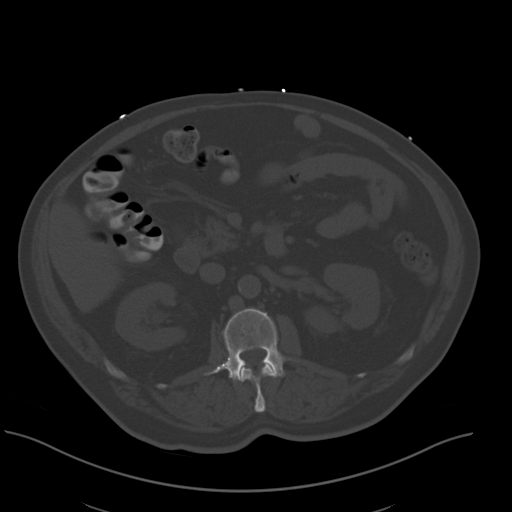
[im 69/93  soft-tissue]
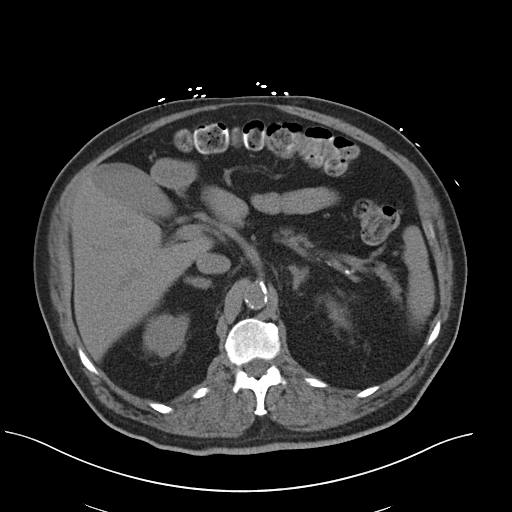
[im 73/93  soft-tissue]
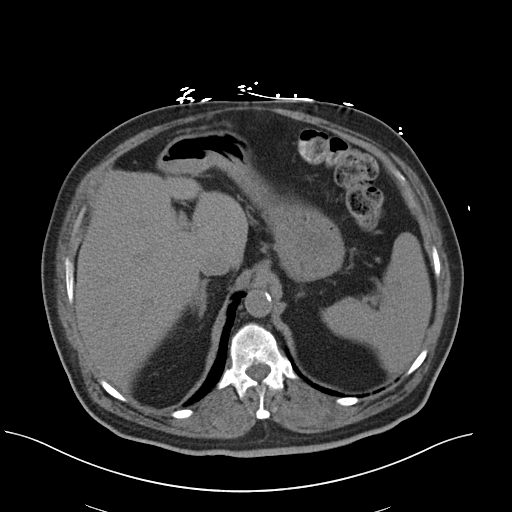
[im 77/93  lung]
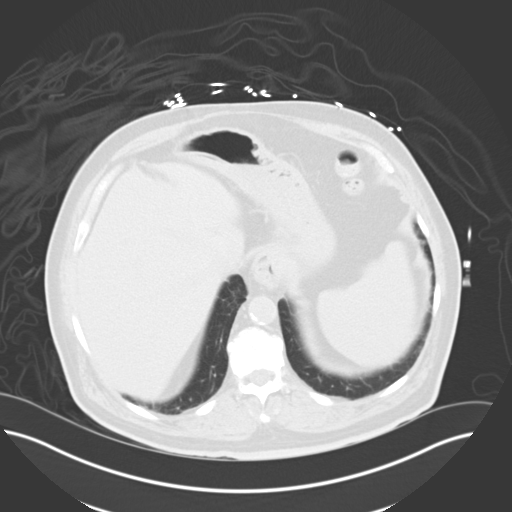
[im 81/93  soft-tissue]
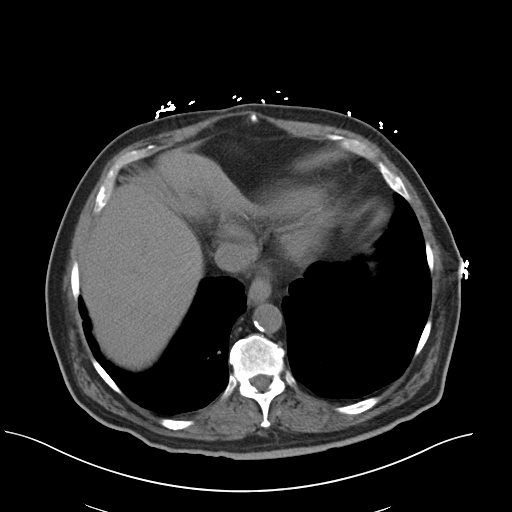
[im 81/93  lung]
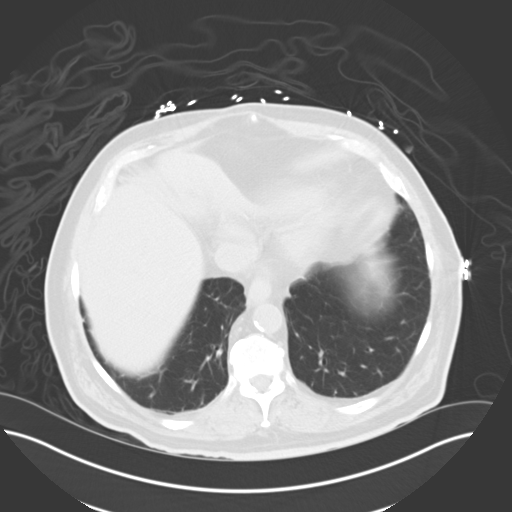
[im 85/93  lung]
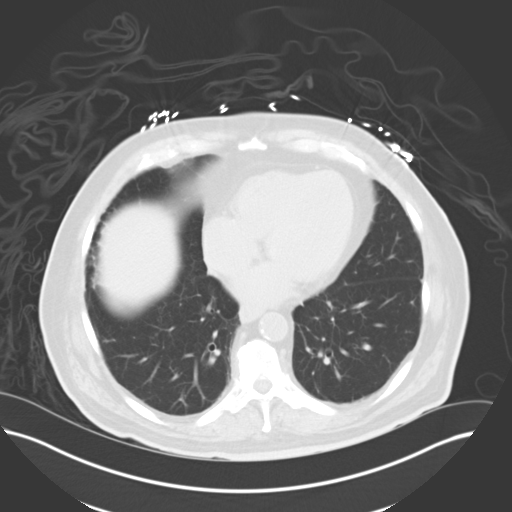
[im 89/93  soft-tissue]
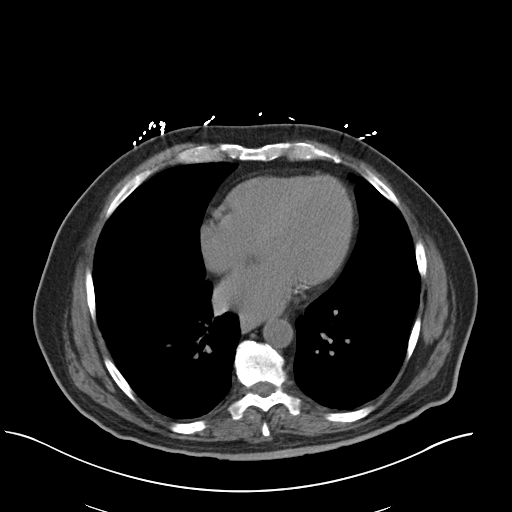
[im 89/93  lung]
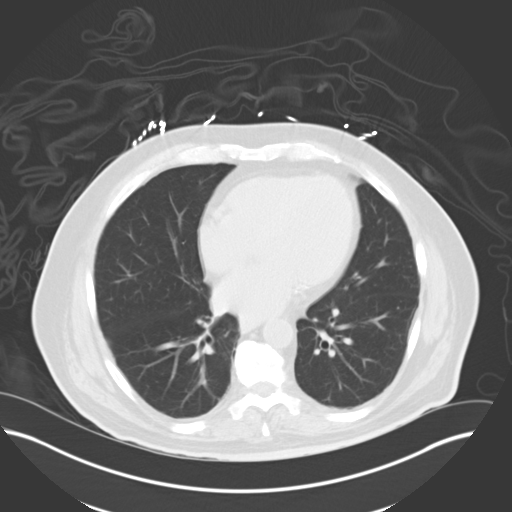

[15 of 32 positions shown; findings below may reference images not displayed]

FINDINGS: Lower chest:  Unremarkable.

Hepatobiliary: No mass visualized on this unenhanced exam.
Gallbladder is unremarkable.

Pancreas: No mass or inflammatory process visualized on this
unenhanced exam.

Spleen:  Within normal limits in size.

Adrenal Glands:  No masses identified.

Kidneys/Urinary tract: No evidence of urolithiasis or
hydronephrosis.

Stomach/Bowel/Peritoneum: Tiny hiatal hernia noted. Diverticulosis
is seen involving the distal descending and sigmoid colon. A short
segment area of colonic wall thickening is seen at the junction of
the descending and sigmoid colon with mild stranding in the adjacent
pericolonic fat. This is consistent with mild diverticulitis,
although the possibility of colon carcinoma cannot definitely be
excluded. There is no evidence of abscess or free fluid.

Vascular/Lymphatic: No pathologically enlarged lymph nodes
identified. No other significant abnormality identified.

Reproductive:  No mass or other significant abnormality noted.

Other:  None.

Musculoskeletal:  No suspicious bone lesions identified.
IMPRESSION: Probable mild diverticulitis at the junction of the descending and
sigmoid colon. Recommend followup by CT or colonoscopy to exclude
the less likely possibility of colon carcinoma.

No evidence of abscess or other complication.

## 2014-04-18 MED ORDER — ACETAMINOPHEN 325 MG PO TABS: 650.0000 mg | ORAL_TABLET | Freq: Four times a day (QID) | ORAL | Status: DC | PRN

## 2014-04-18 MED ORDER — PANTOPRAZOLE SODIUM 40 MG PO TBEC
40.0000 mg | DELAYED_RELEASE_TABLET | Freq: Every day | ORAL | Status: DC
  Administered 2014-04-19 – 2014-04-24 (×6): 40 mg via ORAL
  Filled 2014-04-18 (×6): qty 1

## 2014-04-18 MED ORDER — DONEPEZIL HCL 5 MG PO TABS
10.0000 mg | ORAL_TABLET | Freq: Two times a day (BID) | ORAL | Status: DC
  Administered 2014-04-19 – 2014-04-20 (×4): 10 mg via ORAL
  Administered 2014-04-20: 5 mg via ORAL
  Administered 2014-04-21 – 2014-04-24 (×7): 10 mg via ORAL
  Filled 2014-04-18 (×12): qty 2

## 2014-04-18 MED ORDER — ALLOPURINOL 100 MG PO TABS
100.0000 mg | ORAL_TABLET | Freq: Every day | ORAL | Status: DC
  Administered 2014-04-19 – 2014-04-24 (×6): 100 mg via ORAL
  Filled 2014-04-18 (×6): qty 1

## 2014-04-18 MED ORDER — INSULIN GLARGINE 100 UNIT/ML ~~LOC~~ SOLN
50.0000 [IU] | Freq: Every day | SUBCUTANEOUS | Status: DC
  Administered 2014-04-19: 40 [IU] via SUBCUTANEOUS
  Administered 2014-04-19: 50 [IU] via SUBCUTANEOUS
  Filled 2014-04-18 (×3): qty 0.5

## 2014-04-18 MED ORDER — LOSARTAN POTASSIUM 50 MG PO TABS
100.0000 mg | ORAL_TABLET | Freq: Every day | ORAL | Status: DC
  Administered 2014-04-19 – 2014-04-24 (×6): 100 mg via ORAL
  Filled 2014-04-18 (×7): qty 2

## 2014-04-18 MED ORDER — METRONIDAZOLE 500 MG PO TABS
500.0000 mg | ORAL_TABLET | Freq: Three times a day (TID) | ORAL | Status: DC
Start: 2014-04-18 — End: 2014-04-24

## 2014-04-18 MED ORDER — ACETAMINOPHEN 650 MG RE SUPP: 650.0000 mg | Freq: Four times a day (QID) | RECTAL | Status: DC | PRN

## 2014-04-18 MED ORDER — HYOSCYAMINE SULFATE 0.125 MG PO TBDP
0.2500 mg | ORAL_TABLET | Freq: Two times a day (BID) | ORAL | Status: DC
  Administered 2014-04-19 – 2014-04-24 (×12): 0.25 mg via ORAL
  Filled 2014-04-18 (×15): qty 2

## 2014-04-18 MED ORDER — SODIUM CHLORIDE 0.9 % IV BOLUS (SEPSIS)
1000.0000 mL | Freq: Once | INTRAVENOUS | Status: AC
  Administered 2014-04-18: 1000 mL via INTRAVENOUS

## 2014-04-18 MED ORDER — MEMANTINE HCL 10 MG PO TABS
10.0000 mg | ORAL_TABLET | Freq: Two times a day (BID) | ORAL | Status: DC
  Administered 2014-04-19 – 2014-04-24 (×12): 10 mg via ORAL
  Filled 2014-04-18 (×12): qty 1

## 2014-04-18 MED ORDER — ONDANSETRON HCL 4 MG/2ML IJ SOLN
4.0000 mg | Freq: Four times a day (QID) | INTRAMUSCULAR | Status: DC | PRN
  Filled 2014-04-18: qty 2

## 2014-04-18 MED ORDER — CIPROFLOXACIN IN D5W 400 MG/200ML IV SOLN
400.0000 mg | Freq: Two times a day (BID) | INTRAVENOUS | Status: DC
  Administered 2014-04-18 – 2014-04-22 (×8): 400 mg via INTRAVENOUS
  Filled 2014-04-18 (×8): qty 200

## 2014-04-18 MED ORDER — DOXAZOSIN MESYLATE 2 MG PO TABS
2.0000 mg | ORAL_TABLET | Freq: Every day | ORAL | Status: DC
  Administered 2014-04-19 – 2014-04-24 (×6): 2 mg via ORAL
  Filled 2014-04-18 (×7): qty 1

## 2014-04-18 MED ORDER — ASPIRIN EC 81 MG PO TBEC
81.0000 mg | DELAYED_RELEASE_TABLET | Freq: Every day | ORAL | Status: DC
  Administered 2014-04-19 – 2014-04-24 (×7): 81 mg via ORAL
  Filled 2014-04-18 (×7): qty 1

## 2014-04-18 MED ORDER — CIPROFLOXACIN HCL 100 MG PO TABS: 500.0000 mg | ORAL_TABLET | Freq: Two times a day (BID) | ORAL | Status: DC

## 2014-04-18 MED ORDER — ENOXAPARIN SODIUM 40 MG/0.4ML ~~LOC~~ SOLN
40.0000 mg | SUBCUTANEOUS | Status: DC
  Administered 2014-04-19 – 2014-04-23 (×6): 40 mg via SUBCUTANEOUS
  Filled 2014-04-18 (×5): qty 0.4

## 2014-04-18 MED ORDER — BOOST / RESOURCE BREEZE PO LIQD
1.0000 | Freq: Three times a day (TID) | ORAL | Status: DC
  Administered 2014-04-19: 1 via ORAL

## 2014-04-18 MED ORDER — ATORVASTATIN CALCIUM 10 MG PO TABS
10.0000 mg | ORAL_TABLET | Freq: Every day | ORAL | Status: DC
  Administered 2014-04-19 – 2014-04-24 (×6): 10 mg via ORAL
  Filled 2014-04-18 (×6): qty 1

## 2014-04-18 MED ORDER — METRONIDAZOLE IN NACL 5-0.79 MG/ML-% IV SOLN
500.0000 mg | Freq: Three times a day (TID) | INTRAVENOUS | Status: DC
Start: 2014-04-18 — End: 2014-04-22
  Administered 2014-04-18 – 2014-04-22 (×11): 500 mg via INTRAVENOUS
  Filled 2014-04-18 (×11): qty 100

## 2014-04-18 MED ORDER — GABAPENTIN 300 MG PO CAPS
300.0000 mg | ORAL_CAPSULE | Freq: Two times a day (BID) | ORAL | Status: DC
  Administered 2014-04-19 – 2014-04-24 (×12): 300 mg via ORAL
  Filled 2014-04-18 (×12): qty 1

## 2014-04-18 MED ORDER — HYDROCODONE-ACETAMINOPHEN 5-325 MG PO TABS
1.0000 | ORAL_TABLET | ORAL | Status: DC | PRN
  Administered 2014-04-19: 1 via ORAL
  Administered 2014-04-19 – 2014-04-20 (×2): 2 via ORAL
  Administered 2014-04-20: 1 via ORAL
  Administered 2014-04-21 (×2): 2 via ORAL
  Administered 2014-04-21: 1 via ORAL
  Filled 2014-04-18: qty 2
  Filled 2014-04-18 (×2): qty 1
  Filled 2014-04-18: qty 2
  Filled 2014-04-18: qty 1
  Filled 2014-04-18 (×2): qty 2

## 2014-04-18 MED ORDER — INSULIN ASPART 100 UNIT/ML ~~LOC~~ SOLN
0.0000 [IU] | SUBCUTANEOUS | Status: DC
  Administered 2014-04-19: 1 [IU] via SUBCUTANEOUS
  Administered 2014-04-19: 2 [IU] via SUBCUTANEOUS
  Administered 2014-04-19: 3 [IU] via SUBCUTANEOUS
  Administered 2014-04-19: 1 [IU] via SUBCUTANEOUS
  Administered 2014-04-20: 3 [IU] via SUBCUTANEOUS
  Administered 2014-04-20: 2 [IU] via SUBCUTANEOUS
  Administered 2014-04-20: 1 [IU] via SUBCUTANEOUS
  Administered 2014-04-20: 2 [IU] via SUBCUTANEOUS
  Administered 2014-04-21 (×2): 3 [IU] via SUBCUTANEOUS
  Administered 2014-04-21: 2 [IU] via SUBCUTANEOUS
  Administered 2014-04-22: 1 [IU] via SUBCUTANEOUS
  Administered 2014-04-23: 5 [IU] via SUBCUTANEOUS
  Administered 2014-04-23 – 2014-04-24 (×2): 2 [IU] via SUBCUTANEOUS
  Administered 2014-04-24 (×2): 1 [IU] via SUBCUTANEOUS

## 2014-04-18 MED ORDER — SODIUM CHLORIDE 0.9 % IJ SOLN
3.0000 mL | Freq: Two times a day (BID) | INTRAMUSCULAR | Status: DC
  Administered 2014-04-19 – 2014-04-24 (×2): 3 mL via INTRAVENOUS

## 2014-04-18 MED ORDER — DOCUSATE SODIUM 100 MG PO CAPS
100.0000 mg | ORAL_CAPSULE | Freq: Two times a day (BID) | ORAL | Status: DC
  Administered 2014-04-19 – 2014-04-22 (×5): 100 mg via ORAL
  Filled 2014-04-18 (×10): qty 1

## 2014-04-18 MED ORDER — SODIUM CHLORIDE 0.9 % IV SOLN
INTRAVENOUS | Status: AC
  Administered 2014-04-19 (×2): via INTRAVENOUS

## 2014-04-18 MED ORDER — ONDANSETRON HCL 4 MG PO TABS
4.0000 mg | ORAL_TABLET | Freq: Four times a day (QID) | ORAL | Status: DC | PRN
Start: 2014-04-18 — End: 2014-04-24

## 2014-04-18 NOTE — ED Notes (Signed)
Pt transported to CT at present time; will administer FLAGYL with pt return.
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
## 2014-04-18 NOTE — Progress Notes (Signed)
EDCM spoke to patient at bedside.  Patient confirms his pcp is Dr. Kris Mouton in [place].  Patient is visiting his daughter here in [place].  System updated.

## 2014-04-18 NOTE — ED Notes (Signed)
Lindsey NT at bedside obtaining EKG.

## 2014-04-18 NOTE — ED Notes (Signed)
Bed: WA09 Expected date:  Expected time:  Means of arrival:  Comments: EMS- 81yo M, abdominal pain, n/v/d

## 2014-04-18 NOTE — ED Provider Notes (Signed)
CT results reviewed, likely diverticulitis at the junction of the descending and sigmoid colon.  No abscess.  Leukocytosis (28.4), renal insufficiency.  Negative troponin. No UTI.  Supraventricular rhythm without ectopy. Patient and family report syncopal episode today with urinary incontinence.  Patient did not hit his head.  Denies any chest pain or dyspnea.  Patient has received initial dosing of cipro and flagyl for diverticulosis.  CT results reviewed and shared with patient/family.  Discussed with Dr. Johnney Killian.  Due to syncope presentation, will request admission--hospitalist consulted, will admit.  Etta Quill, NP 04/19/14 [id number]  Charlesetta Shanks, MD 04/22/14 1434
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
## 2014-04-18 NOTE — ED Notes (Addendum)
Per EMS pt recent GI bug 2 weeks ago. Pt complaint near syncope post  nausea/vomiting event; starting this am with no diarrhea. Orthostatic negative and EKG unremarkable en route with EMS.

## 2014-04-19 DIAGNOSIS — R001 Bradycardia, unspecified: Secondary | ICD-10-CM

## 2014-04-19 DIAGNOSIS — R55 Syncope and collapse: Secondary | ICD-10-CM

## 2014-04-19 DIAGNOSIS — I10 Essential (primary) hypertension: Secondary | ICD-10-CM

## 2014-04-19 LAB — COMPREHENSIVE METABOLIC PANEL
ALBUMIN: 3 g/dL — AB (ref 3.5–5.2)
ALK PHOS: 64 U/L (ref 39–117)
ALT: 16 U/L (ref 0–53)
AST: 14 U/L (ref 0–37)
Anion gap: 3 — ABNORMAL LOW (ref 5–15)
BILIRUBIN TOTAL: 1.2 mg/dL (ref 0.3–1.2)
BUN: 18 mg/dL (ref 6–23)
CHLORIDE: 109 mmol/L (ref 96–112)
CO2: 26 mmol/L (ref 19–32)
Calcium: 7.8 mg/dL — ABNORMAL LOW (ref 8.4–10.5)
Creatinine, Ser: 1.35 mg/dL (ref 0.50–1.35)
GFR calc Af Amer: 55 mL/min — ABNORMAL LOW (ref >90)
GFR calc non Af Amer: 48 mL/min — ABNORMAL LOW (ref >90)
Glucose, Bld: 146 mg/dL — ABNORMAL HIGH (ref 70–99)
POTASSIUM: 4 mmol/L (ref 3.5–5.1)
SODIUM: 138 mmol/L (ref 135–145)
Total Protein: 5.7 g/dL — ABNORMAL LOW (ref 6.0–8.3)

## 2014-04-19 LAB — CBC
HCT: 32.8 % — ABNORMAL LOW (ref 39.0–52.0)
Hemoglobin: 11.1 g/dL — ABNORMAL LOW (ref 13.0–17.0)
MCH: 28.8 pg (ref 26.0–34.0)
MCHC: 33.8 g/dL (ref 30.0–36.0)
MCV: 85 fL (ref 78.0–100.0)
PLATELETS: 266 10*3/uL (ref 150–400)
RBC: 3.86 MIL/uL — ABNORMAL LOW (ref 4.22–5.81)
RDW: 13.4 % (ref 11.5–15.5)
WBC: 20.3 10*3/uL — AB (ref 4.0–10.5)

## 2014-04-19 LAB — HEMOGLOBIN A1C
Hgb A1c MFr Bld: 6.6 % — ABNORMAL HIGH (ref 4.8–5.6)
Mean Plasma Glucose: 143 mg/dL

## 2014-04-19 LAB — PHOSPHORUS: Phosphorus: 2.9 mg/dL (ref 2.3–4.6)

## 2014-04-19 LAB — GLUCOSE, CAPILLARY
GLUCOSE-CAPILLARY: 120 mg/dL — AB (ref 70–99)
GLUCOSE-CAPILLARY: 126 mg/dL — AB (ref 70–99)
GLUCOSE-CAPILLARY: 143 mg/dL — AB (ref 70–99)
GLUCOSE-CAPILLARY: 180 mg/dL — AB (ref 70–99)
Glucose-Capillary: 114 mg/dL — ABNORMAL HIGH (ref 70–99)
Glucose-Capillary: 153 mg/dL — ABNORMAL HIGH (ref 70–99)
Glucose-Capillary: 217 mg/dL — ABNORMAL HIGH (ref 70–99)

## 2014-04-19 LAB — TROPONIN I

## 2014-04-19 LAB — MAGNESIUM: Magnesium: 1.6 mg/dL (ref 1.5–2.5)

## 2014-04-19 LAB — TSH: TSH: 0.357 u[IU]/mL (ref 0.350–4.500)

## 2014-04-19 MED ORDER — ENSURE COMPLETE PO LIQD
237.0000 mL | Freq: Two times a day (BID) | ORAL | Status: DC
  Administered 2014-04-19 – 2014-04-23 (×7): 237 mL via ORAL

## 2014-04-19 MED ORDER — SODIUM CHLORIDE 0.9 % IV SOLN
INTRAVENOUS | Status: DC
  Administered 2014-04-19: 22:00:00 via INTRAVENOUS

## 2014-04-19 NOTE — Progress Notes (Signed)
*  PRELIMINARY RESULTS* Vascular Ultrasound Carotid Duplex (Doppler) has been completed.   Findings suggest 1-39% right internal carotid artery stenosis and 40-59% left internal carotid artery stenosis. Vertebral arteries are patent with antegrade flow.  04/19/2014 4:23 PM Maudry Mayhew, RVT, RDCS, RDMS

## 2014-04-19 NOTE — Progress Notes (Signed)
Echocardiogram 2D Echocardiogram has been performed.  04/19/2014 3:14 PM Maudry Mayhew, RVT, RDCS, RDMS
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
## 2014-04-19 NOTE — Progress Notes (Signed)
PROGRESS NOTE  Arty Lantzy NTI:[id number] DOB: 02/27/1930 DOA: 04/18/2014 PCP: PROVIDER NOT IN SYSTEM  HPI/Subjective: Domani Bakos is an 81 year old male with past medical history of hyperlipidemia, GERD, gout, mild dementia, DM2, and HTN who presented to the [facility] on 04/18/14 with nausea, abdominal pain, and near syncopal episode. Patient also reports chills, decreased appetite, and weight loss. Wife reports that he has had colonoscopies in the past with the last being about 2 years ago- no abnormal findings.  In the ED, abdominal CT showed evidence of diverticulitis at junction of descending colon and sigmoid. Patient was also found to be bradycardic with HR in the 40s. Creatinine was increased slightly to 1.59. Patient had leukocytosis with WBC 28.4. UA showed no evidence of infection. Delta troponin were negative.   Today, Mr. Prochnow is feeling much better. Denies nausea an vomiting but does report several episodes of diarrhea a few of which he was incontinent. He has been able to ambulate unassisted. His white count has improved to 20.3. Creatinine has also improved to 1.35. Patient remained bradycardic with HR in the 40-50s so Beta Blocker was held.   Assessment/Plan:    Diverticulitis -CT in ED showed evidence of diverticulitis at junction of descending colon and sigmoid  -Started on Cipro and Flagyl  -Patient is feeling better but still has LLQ pain, although seems improved today. -Continue above medications    Hypertension -Patient on Norvasc, lasix, nebivolol, and losartan at home - Continued losartan 100mg  while inpatient  -Nebivolol held due to bradycardia  -Last BP 137/48 -Continue losartan    DM (diabetes mellitus) type II controlled with renal manifestation -Patient on lantus at home -Started on 50U lantus while inpatient  -CBG ranged 155-114 -Continue Lantus     CKD (chronic kidney disease) stage 3, GFR 30-59 ml/min -Presented with creatinine of 1.59 -Today  creatinine is 1.35 -Monitor with daily BMP    Dementia -On Namenda at home -Continue home medication     Near syncope -Likely bradycardia secondary to Nebivolol  -Held Beta Blocker  -troponin negative  -Awaiting Echo and carotid doppler    Bradycardia -Held Nebivolol  -TSH was normal at 0.357  -Continue telemetry monitoring  DVT Prophylaxis:  Lovenox  Code Status: Full Family Communication: Wife at bedside  Disposition Plan: Remain inpatient    Consultants:  None   Procedures:  None   Antibiotics: Anti-infectives    Start     Dose/Rate Route Frequency Ordered Stop   04/18/14 1630  ciprofloxacin (CIPRO) IVPB 400 mg     400 mg 200 mL/hr over 60 Minutes Intravenous Every 12 hours 04/18/14 1629     04/18/14 1630  metroNIDAZOLE (FLAGYL) IVPB 500 mg     500 mg 100 mL/hr over 60 Minutes Intravenous Every 8 hours 04/18/14 1629     04/18/14 0000  metroNIDAZOLE (FLAGYL) 500 MG tablet     500 mg Oral 3 times daily 04/18/14 1556     04/18/14 0000  ciprofloxacin (CIPRO) 100 MG tablet     500 mg Oral 2 times daily 04/18/14 1556        Objective: Filed Vitals:   04/19/14 0110 04/19/14 0236 04/19/14 0411 04/19/14 1043  BP: 153/48 158/46 144/42 137/48  Pulse: 50 54 45 42  Temp: 98.9 F (37.2 C) 98.9 F (37.2 C) 98.1 F (36.7 C) 97.9 F (36.6 C)  TempSrc: Oral  Oral Oral  Resp: 16 16 20 18   Height:      Weight:  SpO2:   100% 100%    Intake/Output Summary (Last 24 hours) at 04/19/14 1216 Last data filed at 04/19/14 1153  Gross per 24 hour  Intake 838.33 ml  Output    950 ml  Net -111.67 ml   Filed Weights   04/18/14 1349 04/18/14 2141  Weight: 84.823 kg (187 lb) 85.503 kg (188 lb 8 oz)    Exam: General: Well developed, well nourished, NAD, appears stated age  62:  PERR,  Anicteic Sclera, MMM. No pharyngeal erythema or exudates  Neck: Supple, no JVD, no masses  Cardiovascular: RRR, S1 S2 auscultated, no rubs, murmurs or gallops.   Respiratory:  Clear to auscultation bilaterally with equal chest rise  Abdomen: Soft, moderate tenderness to palpation over LLQ, nondistended, + bowel sounds  Extremities: warm dry without cyanosis clubbing or edema.  Neuro: AAOx3, cranial nerves grossly intact. Strength 5/5 in upper and lower extremities  Skin: Without rashes exudates or nodules.   Psych: Normal affect and demeanor with intact judgement and insight   Data Reviewed: Basic Metabolic Panel:  Recent Labs Lab 04/18/14 1459 04/19/14 0340  NA 139 138  K 4.6 4.0  CL 103 109  CO2 28 26  GLUCOSE 159* 146*  BUN 21 18  CREATININE 1.59* 1.35  CALCIUM 9.0 7.8*  MG  --  1.6  PHOS  --  2.9   Liver Function Tests:  Recent Labs Lab 04/18/14 1459 04/19/14 0340  AST 22 14  ALT 24 16  ALKPHOS 91 64  BILITOT 1.2 1.2  PROT 7.5 5.7*  ALBUMIN 4.4 3.0*    Recent Labs Lab 04/18/14 1459  LIPASE 27   CBC:  Recent Labs Lab 04/18/14 1459 04/19/14 0340  WBC 28.4* 20.3*  NEUTROABS 25.9*  --   HGB 14.6 11.1*  HCT 42.7 32.8*  MCV 84.6 85.0  PLT 309 266   Cardiac Enzymes:  Recent Labs Lab 04/18/14 2150 04/19/14 0340  TROPONINI <0.03 <0.03   CBG:  Recent Labs Lab 04/18/14 2313 04/19/14 0034 04/19/14 0409 04/19/14 0809 04/19/14 1155  GLUCAP 155* 153* 120* 114* 143*    No results found for this or any previous visit (from the past 240 hour(s)).   Studies: Ct Abdomen Pelvis Wo Contrast  04/18/2014   CLINICAL DATA:  Left lower quadrant pain for 3 days. nausea and vomiting. Diarrhea. Chills.  EXAM: CT ABDOMEN AND PELVIS WITHOUT CONTRAST  TECHNIQUE: Multidetector CT imaging of the abdomen and pelvis was performed following the standard protocol without IV contrast.  COMPARISON:  None.  FINDINGS: Lower chest:  Unremarkable.  Hepatobiliary: No mass visualized on this unenhanced exam. Gallbladder is unremarkable.  Pancreas: No mass or inflammatory process visualized on this unenhanced exam.  Spleen:  Within normal limits in  size.  Adrenal Glands:  No masses identified.  Kidneys/Urinary tract: No evidence of urolithiasis or hydronephrosis.  Stomach/Bowel/Peritoneum: Tiny hiatal hernia noted. Diverticulosis is seen involving the distal descending and sigmoid colon. A short segment area of colonic wall thickening is seen at the junction of the descending and sigmoid colon with mild stranding in the adjacent pericolonic fat. This is consistent with mild diverticulitis, although the possibility of colon carcinoma cannot definitely be excluded. There is no evidence of abscess or free fluid.  Vascular/Lymphatic: No pathologically enlarged lymph nodes identified. No other significant abnormality identified.  Reproductive:  No mass or other significant abnormality noted.  Other:  None.  Musculoskeletal:  No suspicious bone lesions identified.  IMPRESSION: Probable mild diverticulitis at the junction  of the descending and sigmoid colon. Recommend followup by CT or colonoscopy to exclude the less likely possibility of colon carcinoma.  No evidence of abscess or other complication.   Electronically Signed   By: Earle Gell M.D.   On: 04/18/2014 18:33    Scheduled Meds: . allopurinol  100 mg Oral Daily  . aspirin EC  81 mg Oral Daily  . atorvastatin  10 mg Oral Daily  . ciprofloxacin  400 mg Intravenous Q12H  . docusate sodium  100 mg Oral BID  . donepezil  10 mg Oral BID  . doxazosin  2 mg Oral Daily  . enoxaparin (LOVENOX) injection  40 mg Subcutaneous Q24H  . feeding supplement (RESOURCE BREEZE)  1 Container Oral TID BM  . gabapentin  300 mg Oral BID  . hyoscyamine  0.25 mg Oral BID  . insulin aspart  0-9 Units Subcutaneous 6 times per day  . insulin glargine  50 Units Subcutaneous QHS  . losartan  100 mg Oral Daily  . memantine  10 mg Oral BID  . metronidazole  500 mg Intravenous Q8H  . pantoprazole  40 mg Oral Daily  . sodium chloride  3 mL Intravenous Q12H   Continuous Infusions:   Active Problems:   Diverticulitis    Hypertension   DM (diabetes mellitus) type II controlled with renal manifestation   CKD (chronic kidney disease) stage 3, GFR 30-59 ml/min   Dementia   Near syncope   Bradycardia    Raspect, Erin, PA-S [facility] 04/19/2014, 12:16 PM   Addendum   I personally evaluated patient on 04/19/2014 and agree with the above findings. Patient is a pleasant 81 year old gentleman with a past medical history of hypertension, type 2 diabetes mellitus, and diverticulosis who presented to the emergency department at [name] on 04/18/2014 with complaints of abdominal pain associated with nausea. He also had a near syncopal event at home, denied loss of consciousness. On presentation found to be bradycardic having heart rates in the 40s. With regard to GI symptoms he was further worked up with a CT scan of abdomen and pelvis which showed probable diverticulitis at junction of descending and sigmoid colon. He was started on empiric IV antimicrobial therapy with ciprofloxacin and Flagyl. On my evaluation today he reports feeling much better, denies further episodes of dizziness/lightheadedness or presyncope. I think his bradycardia is likely secondary to bisoprolol which has been discontinued. He remains bradycardia today having heart rates in the 40s to 50s although is asymptomatic. Will give it some time to allow beta blocker to wash out of his system, reassess tomorrow morning.

## 2014-04-19 NOTE — Progress Notes (Signed)
04/19/14 11:30 UR complete.

## 2014-04-20 LAB — GLUCOSE, CAPILLARY
GLUCOSE-CAPILLARY: 193 mg/dL — AB (ref 70–99)
GLUCOSE-CAPILLARY: 199 mg/dL — AB (ref 70–99)
GLUCOSE-CAPILLARY: 67 mg/dL — AB (ref 70–99)
Glucose-Capillary: 153 mg/dL — ABNORMAL HIGH (ref 70–99)
Glucose-Capillary: 183 mg/dL — ABNORMAL HIGH (ref 70–99)
Glucose-Capillary: 141 mg/dL — ABNORMAL HIGH (ref 70–99)
Glucose-Capillary: 235 mg/dL — ABNORMAL HIGH (ref 70–99)

## 2014-04-20 LAB — BASIC METABOLIC PANEL
Anion gap: 3 — ABNORMAL LOW (ref 5–15)
BUN: 20 mg/dL (ref 6–23)
CO2: 24 mmol/L (ref 19–32)
Calcium: 7.4 mg/dL — ABNORMAL LOW (ref 8.4–10.5)
Chloride: 105 mmol/L (ref 96–112)
Creatinine, Ser: 1.33 mg/dL (ref 0.50–1.35)
GFR calc Af Amer: 56 mL/min — ABNORMAL LOW (ref >90)
GFR calc non Af Amer: 48 mL/min — ABNORMAL LOW (ref >90)
Glucose, Bld: 202 mg/dL — ABNORMAL HIGH (ref 70–99)
Potassium: 3.8 mmol/L (ref 3.5–5.1)
SODIUM: 132 mmol/L — AB (ref 135–145)

## 2014-04-20 LAB — CBC
HCT: 35.4 % — ABNORMAL LOW (ref 39.0–52.0)
HEMOGLOBIN: 11.8 g/dL — AB (ref 13.0–17.0)
MCH: 28.4 pg (ref 26.0–34.0)
MCHC: 33.3 g/dL (ref 30.0–36.0)
MCV: 85.1 fL (ref 78.0–100.0)
PLATELETS: 227 10*3/uL (ref 150–400)
RBC: 4.16 MIL/uL — AB (ref 4.22–5.81)
RDW: 13.5 % (ref 11.5–15.5)
WBC: 20.1 10*3/uL — ABNORMAL HIGH (ref 4.0–10.5)

## 2014-04-20 LAB — MAGNESIUM: Magnesium: 1.7 mg/dL (ref 1.5–2.5)

## 2014-04-20 MED ORDER — MAGNESIUM SULFATE 50 % IJ SOLN
3.0000 g | Freq: Once | INTRAVENOUS | Status: AC
  Administered 2014-04-20: 3 g via INTRAVENOUS
  Filled 2014-04-20 (×2): qty 6

## 2014-04-20 MED ORDER — INSULIN GLARGINE 100 UNIT/ML ~~LOC~~ SOLN
40.0000 [IU] | Freq: Every day | SUBCUTANEOUS | Status: DC
  Administered 2014-04-20 – 2014-04-21 (×2): 40 [IU] via SUBCUTANEOUS
  Filled 2014-04-20 (×2): qty 0.4

## 2014-04-20 NOTE — Progress Notes (Signed)
CBG currently 153, pt states his blood sugar is often low at home in early am. He states, "has been treating and managing diabetes for about 10 years." Pt was asymptomatic during event. SRP, RN

## 2014-04-20 NOTE — Progress Notes (Signed)
Family educated regarded enteric precautions.  Family refusing to wear PPE.  Have documented, and will continue to monitor.

## 2014-04-20 NOTE — Progress Notes (Signed)
[facility] PROGRESS NOTE  Peter [name] UXN:[id number] DOB: 04/01/1930 DOA: 04/18/2014 PCP: PROVIDER NOT IN SYSTEM  Assessment/Plan: #1 acute diverticulitis Per CT of the abdomen and pelvis showing diverticulitis at the junction of descending colon and sigmoid. Patient with clinical improvement. Leukocytosis trending down. Patient tolerating current full liquid diet. Continue empiric IV ciprofloxacin and IV Flagyl. Advance diet to a soft diet.  #2 hypertension Continue losartan. Beta blocker on hold secondary to bradycardia.  #3 well-controlled type 2 diabetes mellitus Hemoglobin A1c 6.6. CBGs have ranged from 67-183. Decrease Lantus to 40 units daily. Continue sliding scale insulin.  #4 chronic kidney disease stage III Stable. Follow.  #5 dementia Continue Namenda.  #6 near syncope Likely secondary to bradycardia secondary to beta blocker. Beta blocker on hold. Troponins negative. Carotid Dopplers with no significant ICA stenosis. 2-D echo with EF of 60-65% with no wall motion abnormalities. Follow. May need a Holter monitor as outpatient.  #7 bradycardia Beta blocker on hold as well as calcium channel blocker. Will check heart rate on ambulation.   #8 prophylaxis Lovenox for DVT prophylaxis.  Code Status: Full Family Communication: Updated patient and wife at bedside. Disposition Plan: Home in 1-2 days.   Consultants:  None  Procedures:  CT abdomen and pelvis 04/18/2014  2-D echo 04/19/2014  Carotid Dopplers 04/19/2014  Antibiotics:  IV ciprofloxacin 04/18/2014  IV Flagyl 04/18/2014  HPI/Subjective: Patient states he feels significantly better than on admission. No nausea no emesis. Patient tolerating full liquid diet.  Objective: Filed Vitals:   04/20/14 1413  BP: 149/42  Pulse: 48  Temp: 98.4 F (36.9 C)  Resp: 18    Intake/Output Summary (Last 24 hours) at 04/20/14 2008 Last data filed at 04/20/14 1829  Gross per 24 hour  Intake    720  ml  Output   1125 ml  Net   -405 ml   Filed Weights   04/18/14 1349 04/18/14 2141  Weight: 84.823 kg (187 lb) 85.503 kg (188 lb 8 oz)    Exam:   General:  NAD  Cardiovascular: RRR  Respiratory: CTAB  Abdomen: Soft, nondistended, positive bowel sounds, decreased tenderness to palpation in the left lower quadrant.  Musculoskeletal: No clubbing cyanosis or edema.  Data Reviewed: Basic Metabolic Panel:  Recent Labs Lab 04/18/14 1459 04/19/14 0340 04/20/14 0515 04/20/14 0748  NA 139 138  --  132*  K 4.6 4.0  --  3.8  CL 103 109  --  105  CO2 28 26  --  24  GLUCOSE 159* 146*  --  202*  BUN 21 18  --  20  CREATININE 1.59* 1.35  --  1.33  CALCIUM 9.0 7.8*  --  7.4*  MG  --  1.6 1.7  --   PHOS  --  2.9  --   --    Liver Function Tests:  Recent Labs Lab 04/18/14 1459 04/19/14 0340  AST 22 14  ALT 24 16  ALKPHOS 91 64  BILITOT 1.2 1.2  PROT 7.5 5.7*  ALBUMIN 4.4 3.0*    Recent Labs Lab 04/18/14 1459  LIPASE 27   No results for input(s): AMMONIA in the last 168 hours. CBC:  Recent Labs Lab 04/18/14 1459 04/19/14 0340 04/20/14 0512  WBC 28.4* 20.3* 20.1*  NEUTROABS 25.9*  --   --   HGB 14.6 11.1* 11.8*  HCT 42.7 32.8* 35.4*  MCV 84.6 85.0 85.1  PLT 309 266 227   Cardiac Enzymes:  Recent Labs Lab 04/18/14 2150  04/19/14 0340 04/19/14 0843  TROPONINI <0.03 <0.03 <0.03   BNP (last 3 results) No results for input(s): BNP in the last 8760 hours.  ProBNP (last 3 results) No results for input(s): PROBNP in the last 8760 hours.  CBG:  Recent Labs Lab 04/20/14 0417 04/20/14 0617 04/20/14 0738 04/20/14 1137 04/20/14 1702  GLUCAP 67* 153* 183* 141* 199*    No results found for this or any previous visit (from the past 240 hour(s)).   Studies: No results found.  Scheduled Meds: . allopurinol  100 mg Oral Daily  . aspirin EC  81 mg Oral Daily  . atorvastatin  10 mg Oral Daily  . ciprofloxacin  400 mg Intravenous Q12H  . docusate  sodium  100 mg Oral BID  . donepezil  10 mg Oral BID  . doxazosin  2 mg Oral Daily  . enoxaparin (LOVENOX) injection  40 mg Subcutaneous Q24H  . feeding supplement (ENSURE COMPLETE)  237 mL Oral BID BM  . gabapentin  300 mg Oral BID  . hyoscyamine  0.25 mg Oral BID  . insulin aspart  0-9 Units Subcutaneous 6 times per day  . insulin glargine  40 Units Subcutaneous QHS  . losartan  100 mg Oral Daily  . magnesium sulfate 1 - 4 g bolus IVPB  3 g Intravenous Once  . memantine  10 mg Oral BID  . metronidazole  500 mg Intravenous Q8H  . pantoprazole  40 mg Oral Daily  . sodium chloride  3 mL Intravenous Q12H   Continuous Infusions: . sodium chloride 75 mL/hr at 04/19/14 2228    Principal Problem:   Diverticulitis Active Problems:   Hypertension   DM (diabetes mellitus) type II controlled with renal manifestation   CKD (chronic kidney disease) stage 3, GFR 30-59 ml/min   Dementia   Near syncope   Bradycardia    Time spent: 82 minutes    THOMPSON,DANIEL M.D. [facility] Pager [phone]. If 7PM-7AM, please contact night-coverage at [email or web address], password [facility] 04/20/2014, 8:08 PM  LOS: 2 days

## 2014-04-20 NOTE — Progress Notes (Signed)
Patient ambulated up and down hallway.  HR reached max of 68.  Patient tolerated well.  Will continue to monitor.

## 2014-04-20 NOTE — Progress Notes (Signed)
Inpatient Diabetes Program Recommendations  AACE/ADA: New Consensus Statement on Inpatient Glycemic Control (2013)  Target Ranges:  Prepandial:   less than 140 mg/dL      Peak postprandial:   less than 180 mg/dL (1-2 hours)      Critically ill patients:  140 - 180 mg/dL   Reason for Visit: Hyperglycemia and Hypoglycemia  Diabetes history: DM2 Outpatient Diabetes medications: Lantus 50 units QHS Current orders for Inpatient glycemic control: Lantus 50 units QHS, Novolog sensitive Q4H  Results for BROUGHTON, EPPINGER (MRN [id number]) as of 04/20/2014 10:33  Ref. Range 04/19/2014 20:15 04/19/2014 23:36 04/20/2014 04:17 04/20/2014 06:17 04/20/2014 07:38  Glucose-Capillary Latest Range: 70-99 mg/dL 217 (H) 180 (H) 67 (L) 153 (H) 183 (H)    Inpatient Diabetes Program Recommendations Insulin - Basal: Decrease Lantus to 40 units QHS (home dose is 50) HgbA1C: 6.6% - indicates good control at home. Diet: On [place] diet. When advanced, CHO mod med  Note: Will follow. Thank you. Lorenda Peck, RD, LDN, CDE Inpatient Diabetes Coordinator [phone]

## 2014-04-20 NOTE — Progress Notes (Signed)
Pt has CBG of 67--Ensure given for CHO and protein support, since pt on Full liq diet--will follow up .SRP, RN

## 2014-04-21 ENCOUNTER — Inpatient Hospital Stay (HOSPITAL_COMMUNITY): Payer: Medicare Other

## 2014-04-21 DIAGNOSIS — R1032 Left lower quadrant pain: Secondary | ICD-10-CM

## 2014-04-21 DIAGNOSIS — R109 Unspecified abdominal pain: Secondary | ICD-10-CM | POA: Diagnosis present

## 2014-04-21 LAB — CBC WITH DIFFERENTIAL/PLATELET
BASOS PCT: 0 % (ref 0–1)
Basophils Absolute: 0 10*3/uL (ref 0.0–0.1)
EOS ABS: 0.2 10*3/uL (ref 0.0–0.7)
Eosinophils Relative: 1 % (ref 0–5)
HCT: 30.5 % — ABNORMAL LOW (ref 39.0–52.0)
Hemoglobin: 10.2 g/dL — ABNORMAL LOW (ref 13.0–17.0)
LYMPHS PCT: 11 % — AB (ref 12–46)
Lymphs Abs: 1.6 10*3/uL (ref 0.7–4.0)
MCH: 28.2 pg (ref 26.0–34.0)
MCHC: 33.4 g/dL (ref 30.0–36.0)
MCV: 84.3 fL (ref 78.0–100.0)
Monocytes Absolute: 1.2 10*3/uL — ABNORMAL HIGH (ref 0.1–1.0)
Monocytes Relative: 9 % (ref 3–12)
NEUTROS ABS: 10.9 10*3/uL — AB (ref 1.7–7.7)
Neutrophils Relative %: 79 % — ABNORMAL HIGH (ref 43–77)
Platelets: 239 10*3/uL (ref 150–400)
RBC: 3.62 MIL/uL — ABNORMAL LOW (ref 4.22–5.81)
RDW: 13.5 % (ref 11.5–15.5)
WBC: 13.9 10*3/uL — ABNORMAL HIGH (ref 4.0–10.5)

## 2014-04-21 LAB — GLUCOSE, CAPILLARY
GLUCOSE-CAPILLARY: 84 mg/dL (ref 70–99)
GLUCOSE-CAPILLARY: 66 mg/dL — AB (ref 70–99)
GLUCOSE-CAPILLARY: 214 mg/dL — AB (ref 70–99)
Glucose-Capillary: 120 mg/dL — ABNORMAL HIGH (ref 70–99)
Glucose-Capillary: 126 mg/dL — ABNORMAL HIGH (ref 70–99)
Glucose-Capillary: 206 mg/dL — ABNORMAL HIGH (ref 70–99)
Glucose-Capillary: 75 mg/dL (ref 70–99)

## 2014-04-21 LAB — BASIC METABOLIC PANEL
ANION GAP: 5 (ref 5–15)
BUN: 13 mg/dL (ref 6–23)
CHLORIDE: 108 mmol/L (ref 96–112)
CO2: 26 mmol/L (ref 19–32)
Calcium: 7.9 mg/dL — ABNORMAL LOW (ref 8.4–10.5)
Creatinine, Ser: 1.27 mg/dL (ref 0.50–1.35)
GFR, EST AFRICAN AMERICAN: 59 mL/min — AB (ref >90)
GFR, EST NON AFRICAN AMERICAN: 51 mL/min — AB (ref >90)
Glucose, Bld: 49 mg/dL — ABNORMAL LOW (ref 70–99)
Potassium: 3.7 mmol/L (ref 3.5–5.1)
Sodium: 139 mmol/L (ref 135–145)

## 2014-04-21 LAB — MAGNESIUM: MAGNESIUM: 2.4 mg/dL (ref 1.5–2.5)

## 2014-04-21 IMAGING — DX DG ABDOMEN 1V
2 series · 2 of 2 positions shown · non-contrast
Comparison: [DATE]

CLINICAL DATA: Initial evaluation for diffuse abdominal pain with
history of reflux and diabetes

EXAM:
ABDOMEN - 1 VIEW

[abdomen kub (1 of 2)]
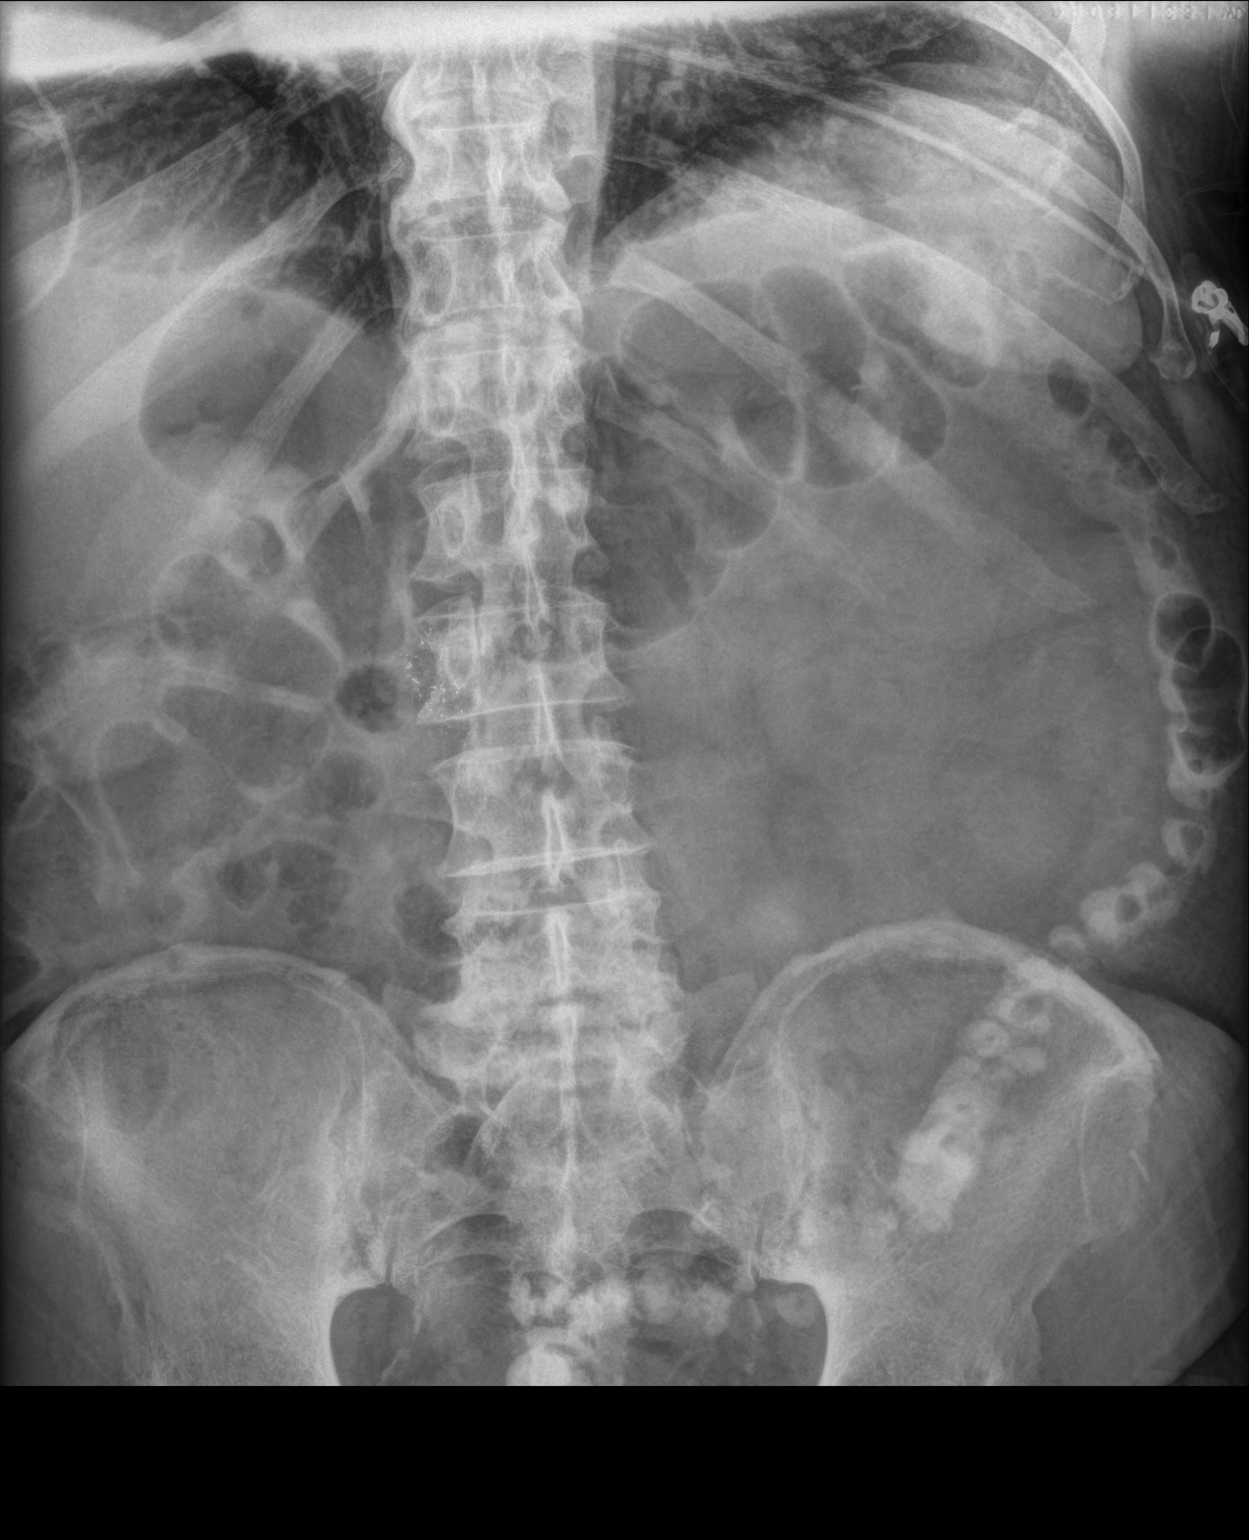

[abdomen kub (2 of 2)]
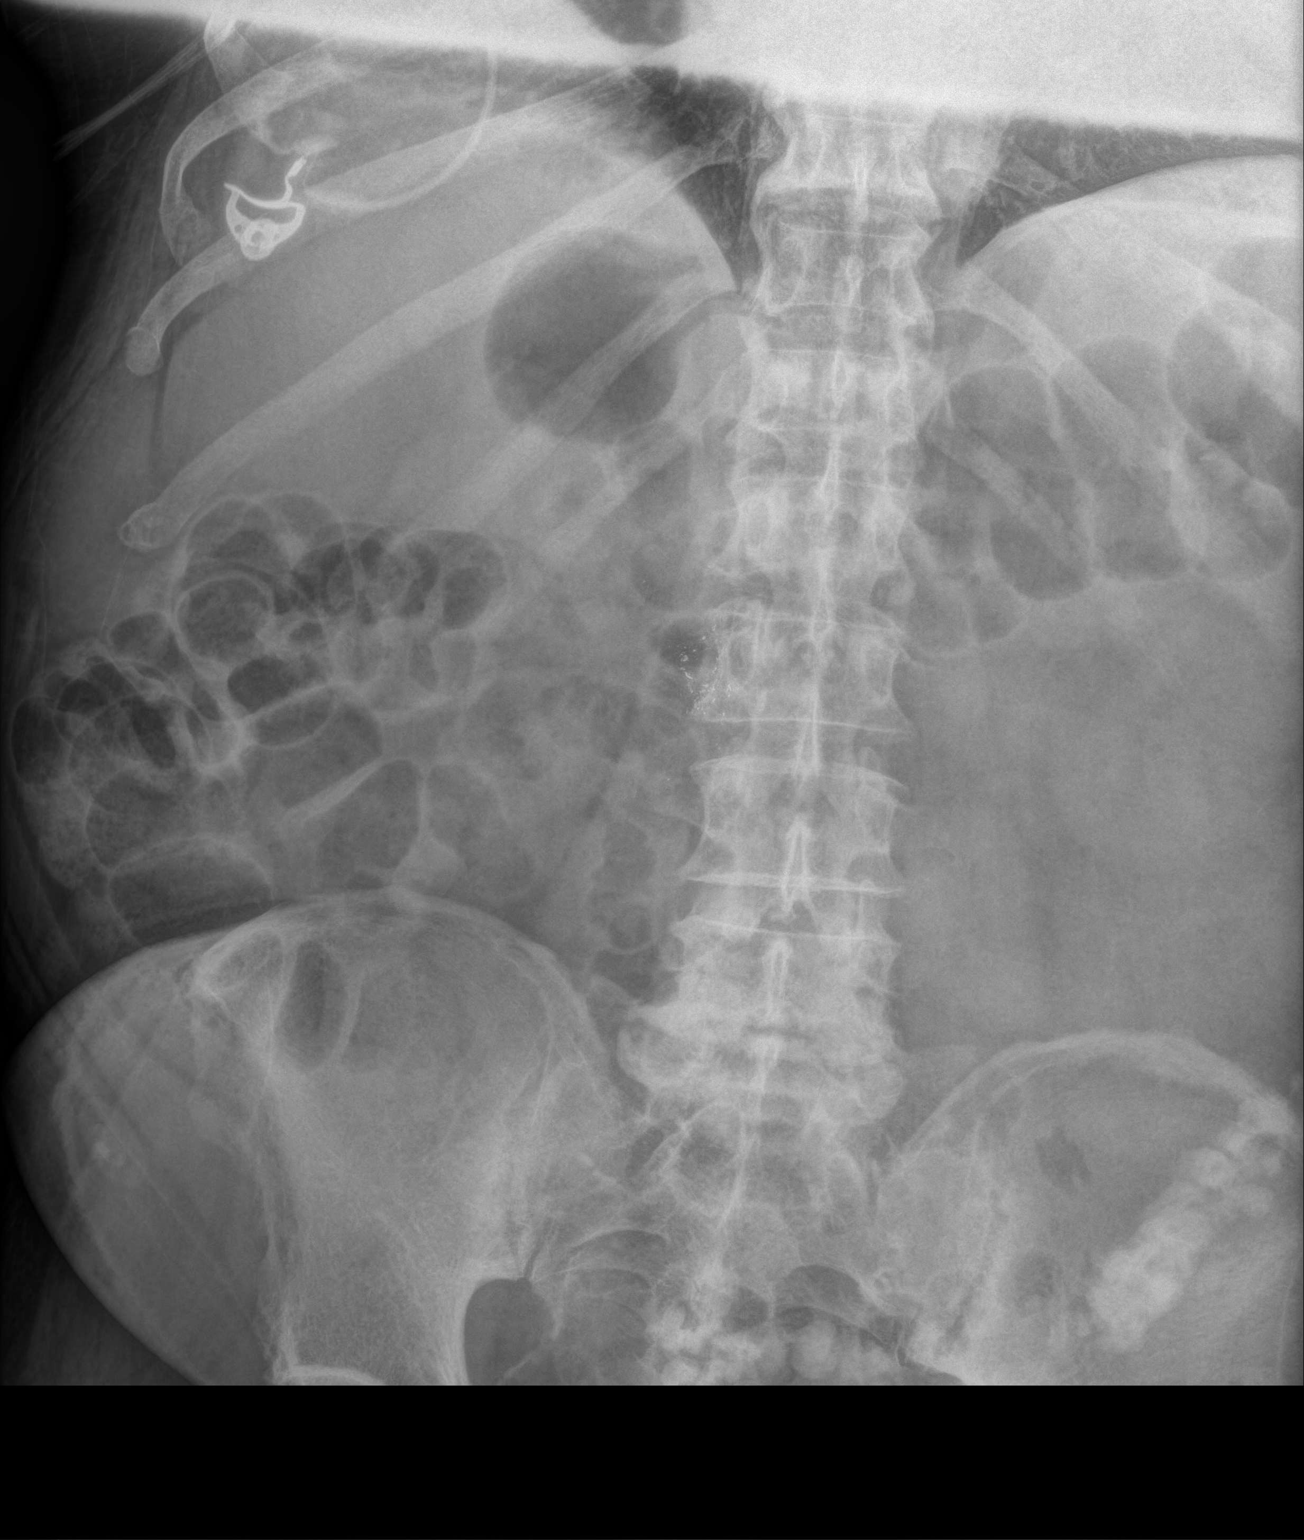

[2 of 2 positions shown; findings below may reference images not displayed]

FINDINGS: Foreign body material in the right paraspinous musculature in the
mid lumbar region stable from prior study. There are no abnormally
dilated loops of bowel. Contrast is seen into the rectum. Mild
diverticulosis of the distal descending and sigmoid colon noted.
IMPRESSION: No acute finding

## 2014-04-21 MED ORDER — POLYETHYLENE GLYCOL 3350 17 G PO PACK: 17.0000 g | PACK | Freq: Every day | ORAL | Status: DC | PRN

## 2014-04-21 MED ORDER — POLYETHYLENE GLYCOL 3350 17 G PO PACK
17.0000 g | PACK | Freq: Every day | ORAL | Status: DC
  Administered 2014-04-21: 17 g via ORAL
  Filled 2014-04-21: qty 1

## 2014-04-21 NOTE — Progress Notes (Signed)
Hypoglycemic Event  CBG: 66  Treatment: 15 GM carbohydrate snack  Symptoms: None  Follow-up CBG: Time:0810 CBG Result:75  Possible Reasons for Event: Unknown  Comments/MD notified:[place] notified. Will continue to monitor    Peter [name]  Remember to initiate Hypoglycemia Order Set & complete

## 2014-04-21 NOTE — Progress Notes (Signed)
Inpatient Diabetes Program Recommendations  AACE/ADA: New Consensus Statement on Inpatient Glycemic Control (2013)  Target Ranges:  Prepandial:   less than 140 mg/dL      Peak postprandial:   less than 180 mg/dL (1-2 hours)      Critically ill patients:  140 - 180 mg/dL   Reason for Visit: Hypoglycemia  Results for Peter, [name] (MRN [id number]) as of 04/21/2014 10:40  Ref. Range 04/21/2014 05:37  Glucose Latest Range: 70-99 mg/dL 49 (L)   Continue to decrease Lantus - 35 units QHS.  Thank you. Lorenda Peck, RD, LDN, CDE Inpatient Diabetes Coordinator [phone]

## 2014-04-21 NOTE — Evaluation (Signed)
Physical Therapy Evaluation Patient Details Name: Peter [name] MRN: [id number] DOB: August 10, 1930 Today's Date: 04/21/2014   History of Present Illness  Pt is an 81 year old male admitted with acute diverticulitis with hx of DM, gout, mild dementia  Clinical Impression  Pt admitted with above diagnosis. Pt currently with functional limitations due to the deficits listed below (see PT Problem List).  Pt will benefit from skilled PT to increase their independence and safety with mobility to allow discharge to the venue listed below.   Pt ambulated around unit with IV pole for support.  HR up to 71 bpm per telemetry monitor during gait.  Pt reports feeling better however still c/o abdominal pain, no BM, and feeling a little weak.  Recommended pt continue ambulating with staff.  Also recommended pt not fly back to [place] after d/c until feeling better, and spouse states she agrees and that they are staying at daughter's house.     Follow Up Recommendations No PT follow up;Supervision for mobility/OOB    Equipment Recommendations  Cane (possibly cane depending on progress)    Recommendations for Other Services       Precautions / Restrictions Precautions Precautions: Fall      Mobility  Bed Mobility Overal bed mobility: Modified Independent                Transfers Overall transfer level: Needs assistance Equipment used: 1 person hand held assist Transfers: Sit to/from Stand Sit to Stand: Min guard         General transfer comment: verbal cues for safe technique  Ambulation/Gait Ambulation/Gait assistance: Min guard Ambulation Distance (Feet): 400 Feet   Gait Pattern/deviations: Step-through pattern;Decreased stride length;Wide base of support     General Gait Details: increased toe out bilaterally also slower pace then normal with shorter steps per pt, mildly unsteady however no LOB, used IV pole for a little support  Stairs            Wheelchair Mobility     Modified Rankin (Stroke Patients Only)       Balance                                             Pertinent Vitals/Pain Pain Assessment: 0-10 Pain Score: 5  Pain Location: left lower quadrant abdomen Pain Descriptors / Indicators: Discomfort Pain Intervention(s): Limited activity within patient's tolerance;Monitored during session;Repositioned    Home Living Family/patient expects to be discharged to:: Private residence Living Arrangements: Spouse/significant other Available Help at Discharge: Family Type of Home: House Home Access: Stairs to enter     Home Layout: Two level   Additional Comments: from [place] here visiting his daughter with his spouse    Prior Function Level of Independence: Independent               Hand Dominance        Extremity/Trunk Assessment               Lower Extremity Assessment: Generalized weakness         Communication   Communication: No difficulties  Cognition Arousal/Alertness: Awake/alert Behavior During Therapy: WFL for tasks assessed/performed Overall Cognitive Status: History of cognitive impairments - at baseline                      General Comments      Exercises  Assessment/Plan    PT Assessment Patient needs continued PT services  PT Diagnosis Difficulty walking   PT Problem List Decreased strength;Decreased mobility;Decreased balance  PT Treatment Interventions DME instruction;Gait training;Functional mobility training;Therapeutic activities;Therapeutic exercise;Patient/family education;Balance training;Stair training   PT Goals (Current goals can be found in the Care Plan section) Acute Rehab PT Goals PT Goal Formulation: With patient/family Time For Goal Achievement: 04/28/14 Potential to Achieve Goals: Good    Frequency Min 3X/week   Barriers to discharge        Co-evaluation               End of Session Equipment Utilized During Treatment: Gait  belt Activity Tolerance: Patient tolerated treatment well Patient left: in chair;with call bell/phone within reach;with family/visitor present           Time: 0911-0930 PT Time Calculation (min) (ACUTE ONLY): 19 min   Charges:   PT Evaluation $Initial PT Evaluation Tier I: 1 Procedure     PT G Codes:        Bemnet Trovato,KATHrine E 04/21/2014, 11:38 AM Carmelia Bake, PT, DPT 04/21/2014 Pager: [phone]

## 2014-04-21 NOTE — Evaluation (Signed)
Occupational Therapy Evaluation Patient Details Name: Kahiau Schewe MRN: [id number] DOB: 1930-08-10 Today's Date: 04/21/2014    History of Present Illness Pt is an 81 year old male admitted with acute diverticulitis with hx of DM, gout, mild dementia   Clinical Impression   Pt was admitted for the above.  He needs overall min guard for safety for adls.  Pt does not need DME nor further OT.  He plans to return to daughter's with his wife and then they will fly home to [place]    Follow Up Recommendations  No OT follow up    Equipment Recommendations  None recommended by OT    Recommendations for Other Services       Precautions / Restrictions Precautions Precautions: Fall Restrictions Weight Bearing Restrictions: No      Mobility Bed Mobility Overal bed mobility: Modified Independent                Transfers Overall transfer level: Needs assistance Equipment used: None Transfers: Sit to/from Stand Sit to Stand: Min guard         General transfer comment: for safety    Balance                                            ADL Overall ADL's : Needs assistance/impaired             Lower Body Bathing: Min guard;Sit to/from stand       Lower Body Dressing: Min guard;Sit to/from stand   Toilet Transfer: Min guard;Comfort height toilet   Toileting- Water quality scientist and Hygiene: Min guard;Sit to/from stand         General ADL Comments: Pt used IV pole for support ambulating to bathroom.  Pt able to cross legs for LB adls.  Pt a little unsteady coming out of bathroom over ledge, but no LOB     Vision     Perception     Praxis      Pertinent Vitals/Pain Pain Assessment: 0-10 Pain Score: 5  Pain Location: abdomen Pain Descriptors / Indicators: Discomfort Pain Intervention(s): Limited activity within patient's tolerance;Monitored during session;Repositioned     Hand Dominance     Extremity/Trunk Assessment Upper  Extremity Assessment Upper Extremity Assessment: Overall WFL for tasks assessed          Communication Communication Communication: No difficulties   Cognition Arousal/Alertness: Awake/alert Behavior During Therapy: WFL for tasks assessed/performed Overall Cognitive Status: History of cognitive impairments - at baseline                     General Comments       Exercises       Shoulder Instructions      Home Living Family/patient expects to be discharged to:: Private residence Living Arrangements: Spouse/significant other Available Help at Discharge: Family Type of Home: House Home Access: Stairs to enter     Home Layout: Two level Alternate Level Stairs-Number of Steps: flight                 Additional Comments: from [place] here visiting his daughter with his spouse:  they will stay at daughter's for a couple/few days.  They have a standard commode and he can use walk in shower with grab bar      Prior Functioning/Environment Level of Independence: Independent  OT Diagnosis: Generalized weakness   OT Problem List:     OT Treatment/Interventions:      OT Goals(Current goals can be found in the care plan section) Acute Rehab OT Goals Patient Stated Goal: home   OT Frequency:     Barriers to D/C:            Co-evaluation              End of Session    Activity Tolerance: Patient tolerated treatment well Patient left: in chair;with call bell/phone within reach;with family/visitor present   Time: 1206-1228 OT Time Calculation (min): 22 min Charges:  OT General Charges $OT Visit: 1 Procedure OT Evaluation $Initial OT Evaluation Tier I: 1 Procedure G-Codes:    Christyl Osentoski 2014-04-27, 1:01 PM Lesle Chris, OTR/L [phone] 27-Apr-2014

## 2014-04-21 NOTE — Progress Notes (Signed)
[facility] PROGRESS NOTE  Peter [name] OJJ:[id number] DOB: Jun 25, 1930 DOA: 04/18/2014 PCP: PROVIDER NOT IN SYSTEM  Assessment/Plan: #1 acute diverticulitis Per CT of the abdomen and pelvis showing diverticulitis at the junction of descending colon and sigmoid. Patient with clinical improvement. Leukocytosis trending down. Patient states he's lost his appetite some dry heaves early on on a soft diet. Will change his diet back down to a full liquid diet. Continue empiric IV ciprofloxacin and IV Flagyl.   #2 hypertension Continue losartan. Beta blocker on hold secondary to bradycardia.  #3 well-controlled type 2 diabetes mellitus Hemoglobin A1c 6.6. CBGs have ranged from 84-214. Continue Lantus to 40 units daily. Continue sliding scale insulin.  #4 chronic kidney disease stage III Stable. Follow.  #5 dementia Continue Namenda.  #6 near syncope Likely secondary to bradycardia secondary to beta blocker. Beta blocker on hold. Troponins negative. Carotid Dopplers with no significant ICA stenosis. 2-D echo with EF of 60-65% with no wall motion abnormalities. Follow. May need a Holter monitor as outpatient.  #7 bradycardia Beta blocker on hold as well as calcium channel blocker. Heart rate increased on ambulation. Follow.  #8 prophylaxis Lovenox for DVT prophylaxis.  Code Status: Full Family Communication: Updated patient and wife at bedside. Disposition Plan: Home in 1-2 days.   Consultants:  None  Procedures:  CT abdomen and pelvis 04/18/2014  2-D echo 04/19/2014  Carotid Dopplers 04/19/2014  Antibiotics:  IV ciprofloxacin 04/18/2014  IV Flagyl 04/18/2014  HPI/Subjective: Patient states he feels significantly better than on admission. No nausea. Patient with some dry heaves early on this morning. Patient states has lost his appetite.   Objective: Filed Vitals:   04/21/14 1341  BP: 159/54  Pulse: 56  Temp: 99 F (37.2 C)  Resp: 16    Intake/Output  Summary (Last 24 hours) at 04/21/14 1829 Last data filed at 04/21/14 1828  Gross per 24 hour  Intake    480 ml  Output    800 ml  Net   -320 ml   Filed Weights   04/18/14 1349 04/18/14 2141  Weight: 84.823 kg (187 lb) 85.503 kg (188 lb 8 oz)    Exam:   General:  NAD  Cardiovascular: RRR  Respiratory: CTAB  Abdomen: Soft, nondistended, positive bowel sounds, decreased tenderness to palpation in the left lower quadrant.  Musculoskeletal: No clubbing cyanosis or edema.  Data Reviewed: Basic Metabolic Panel:  Recent Labs Lab 04/18/14 1459 04/19/14 0340 04/20/14 0515 04/20/14 0748 04/21/14 0537  NA 139 138  --  132* 139  K 4.6 4.0  --  3.8 3.7  CL 103 109  --  105 108  CO2 28 26  --  24 26  GLUCOSE 159* 146*  --  202* 49*  BUN 21 18  --  20 13  CREATININE 1.59* 1.35  --  1.33 1.27  CALCIUM 9.0 7.8*  --  7.4* 7.9*  MG  --  1.6 1.7  --  2.4  PHOS  --  2.9  --   --   --    Liver Function Tests:  Recent Labs Lab 04/18/14 1459 04/19/14 0340  AST 22 14  ALT 24 16  ALKPHOS 91 64  BILITOT 1.2 1.2  PROT 7.5 5.7*  ALBUMIN 4.4 3.0*    Recent Labs Lab 04/18/14 1459  LIPASE 27   No results for input(s): AMMONIA in the last 168 hours. CBC:  Recent Labs Lab 04/18/14 1459 04/19/14 0340 04/20/14 0512 04/21/14 0537  WBC 28.4* 20.3*  20.1* 13.9*  NEUTROABS 25.9*  --   --  10.9*  HGB 14.6 11.1* 11.8* 10.2*  HCT 42.7 32.8* 35.4* 30.5*  MCV 84.6 85.0 85.1 84.3  PLT 309 266 227 239   Cardiac Enzymes:  Recent Labs Lab 04/18/14 2150 04/19/14 0340 04/19/14 0843  TROPONINI <0.03 <0.03 <0.03   BNP (last 3 results) No results for input(s): BNP in the last 8760 hours.  ProBNP (last 3 results) No results for input(s): PROBNP in the last 8760 hours.  CBG:  Recent Labs Lab 04/20/14 1702 04/20/14 2021 04/20/14 2342 04/21/14 0345 04/21/14 1715  GLUCAP 199* 235* 193* 84 214*    No results found for this or any previous visit (from the past 240  hour(s)).   Studies: Dg Abd 1 View  04/21/2014   CLINICAL DATA:  Initial evaluation for diffuse abdominal pain with history of reflux and diabetes  EXAM: ABDOMEN - 1 VIEW  COMPARISON:  04/18/2014  FINDINGS: Foreign body material in the right paraspinous musculature in the mid lumbar region stable from prior study. There are no abnormally dilated loops of bowel. Contrast is seen into the rectum. Mild diverticulosis of the distal descending and sigmoid colon noted.  IMPRESSION: No acute finding   Electronically Signed   By: Skipper Cliche M.D.   On: 04/21/2014 10:32    Scheduled Meds: . allopurinol  100 mg Oral Daily  . aspirin EC  81 mg Oral Daily  . atorvastatin  10 mg Oral Daily  . ciprofloxacin  400 mg Intravenous Q12H  . docusate sodium  100 mg Oral BID  . donepezil  10 mg Oral BID  . doxazosin  2 mg Oral Daily  . enoxaparin (LOVENOX) injection  40 mg Subcutaneous Q24H  . feeding supplement (ENSURE COMPLETE)  237 mL Oral BID BM  . gabapentin  300 mg Oral BID  . hyoscyamine  0.25 mg Oral BID  . insulin aspart  0-9 Units Subcutaneous 6 times per day  . insulin glargine  40 Units Subcutaneous QHS  . losartan  100 mg Oral Daily  . memantine  10 mg Oral BID  . metronidazole  500 mg Intravenous Q8H  . pantoprazole  40 mg Oral Daily  . sodium chloride  3 mL Intravenous Q12H   Continuous Infusions:    Principal Problem:   Diverticulitis Active Problems:   Hypertension   DM (diabetes mellitus) type II controlled with renal manifestation   CKD (chronic kidney disease) stage 3, GFR 30-59 ml/min   Dementia   Near syncope   Bradycardia    Time spent: 47 minutes    Nolon Yellin M.D. [facility] Pager [phone]. If 7PM-7AM, please contact night-coverage at [email or web address], password [facility] 04/21/2014, 6:29 PM  LOS: 3 days

## 2014-04-21 NOTE — Progress Notes (Signed)
Pt was dry heaving and had a very small amount of emesis. Will continue to monitor

## 2014-04-21 NOTE — Care Management Note (Signed)
    Page 1 of 1   04/21/2014     11:58:40 AM CARE MANAGEMENT NOTE 04/21/2014  Patient:  Peter [name],Peter [name]   Account Number:  [id number]  Date Initiated:  04/21/2014  Documentation initiated by:  Dessa Phi  Subjective/Objective Assessment:   81 y/o m admitted w/diverticulitis.     Action/Plan:   From home.   Anticipated DC Date:  04/25/2014   Anticipated DC Plan:  [place]  CM consult      Choice offered to / List presented to:             Status of service:  In process, will continue to follow Medicare Important Message given?   (If response is "NO", the following Medicare IM given date fields will be blank) Date Medicare IM given:   Medicare IM given by:   Date Additional Medicare IM given:   Additional Medicare IM given by:    Discharge Disposition:    Per UR Regulation:  Reviewed for med. necessity/level of care/duration of stay  If discussed at [place] of Stay Meetings, dates discussed:    Comments:  04/21/14 Dessa Phi RN BSN NCM [phone] PT-no f/u.No anticipated d/c needs.

## 2014-04-22 ENCOUNTER — Inpatient Hospital Stay (HOSPITAL_COMMUNITY): Payer: Medicare Other

## 2014-04-22 DIAGNOSIS — R112 Nausea with vomiting, unspecified: Secondary | ICD-10-CM

## 2014-04-22 LAB — GLUCOSE, CAPILLARY
GLUCOSE-CAPILLARY: 121 mg/dL — AB (ref 70–99)
GLUCOSE-CAPILLARY: 127 mg/dL — AB (ref 70–99)
GLUCOSE-CAPILLARY: 39 mg/dL — AB (ref 70–99)
GLUCOSE-CAPILLARY: 82 mg/dL (ref 70–99)
GLUCOSE-CAPILLARY: 85 mg/dL (ref 70–99)
Glucose-Capillary: 106 mg/dL — ABNORMAL HIGH (ref 70–99)
Glucose-Capillary: 146 mg/dL — ABNORMAL HIGH (ref 70–99)

## 2014-04-22 LAB — BASIC METABOLIC PANEL
Anion gap: 8 (ref 5–15)
BUN: 14 mg/dL (ref 6–23)
CALCIUM: 8 mg/dL — AB (ref 8.4–10.5)
CO2: 25 mmol/L (ref 19–32)
Chloride: 105 mmol/L (ref 96–112)
Creatinine, Ser: 1.31 mg/dL (ref 0.50–1.35)
GFR calc non Af Amer: 49 mL/min — ABNORMAL LOW (ref >90)
GFR, EST AFRICAN AMERICAN: 57 mL/min — AB (ref >90)
Glucose, Bld: 87 mg/dL (ref 70–99)
POTASSIUM: 4 mmol/L (ref 3.5–5.1)
Sodium: 138 mmol/L (ref 135–145)

## 2014-04-22 LAB — CBC
HEMATOCRIT: 29.9 % — AB (ref 39.0–52.0)
HEMOGLOBIN: 10 g/dL — AB (ref 13.0–17.0)
MCH: 28.4 pg (ref 26.0–34.0)
MCHC: 33.4 g/dL (ref 30.0–36.0)
MCV: 84.9 fL (ref 78.0–100.0)
Platelets: 244 10*3/uL (ref 150–400)
RBC: 3.52 MIL/uL — AB (ref 4.22–5.81)
RDW: 13.7 % (ref 11.5–15.5)
WBC: 11.2 10*3/uL — AB (ref 4.0–10.5)

## 2014-04-22 IMAGING — CT CT ABD-PELV W/O CM
2 of 3 series · 17 of 46 positions shown, 19 images · non-contrast
Comparison: [DATE]

CLINICAL DATA: Nausea and vomiting. Diverticulitis. Prostate
carcinoma.

EXAM:
CT ABDOMEN AND PELVIS WITHOUT CONTRAST
TECHNIQUE: Multidetector CT imaging of the abdomen and pelvis was performed
following the standard protocol without IV contrast.

[Series 2: rtn a/p w/o · axial · non-contrast · 0.98mm/px · z∈[-572,-342]mm · 14 of 54 slices shown, 16 images]
[im 4/54  soft-tissue]
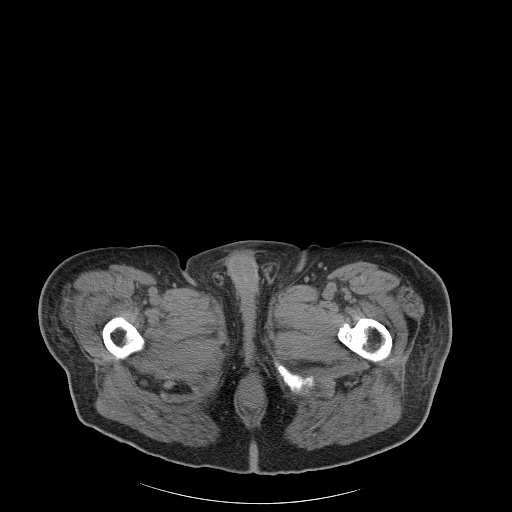
[im 4/54  bone]
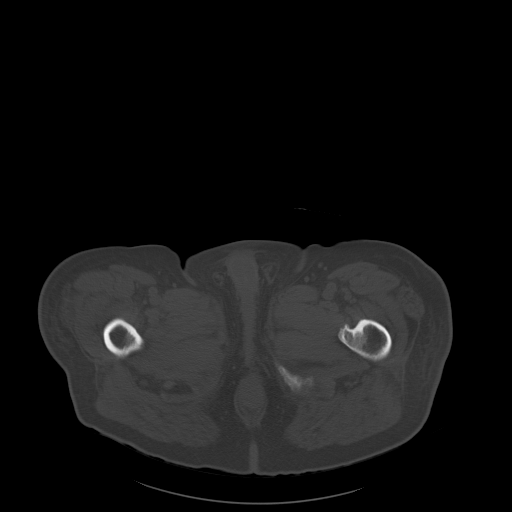
[im 7/54  soft-tissue]
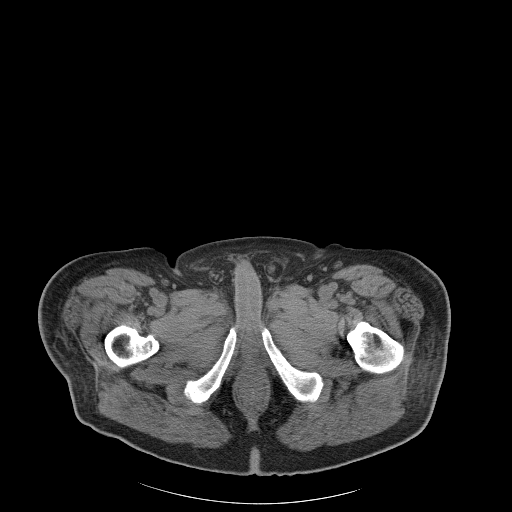
[im 11/54  soft-tissue]
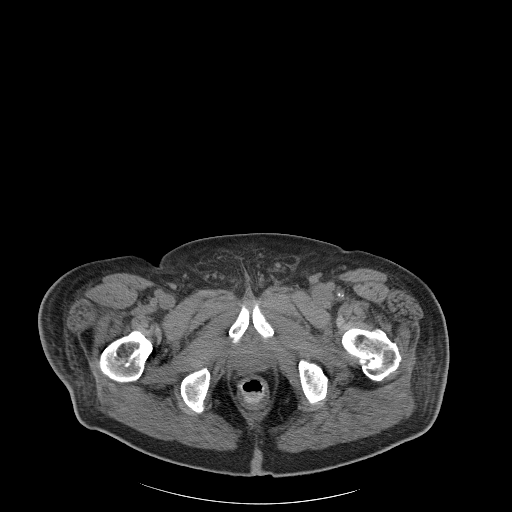
[im 14/54  soft-tissue]
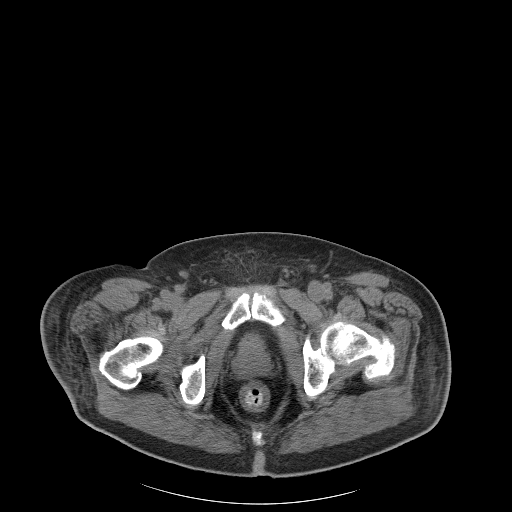
[im 18/54  soft-tissue]
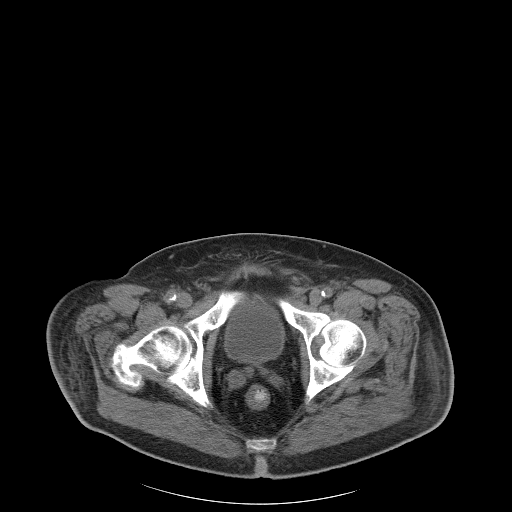
[im 21/54  soft-tissue]
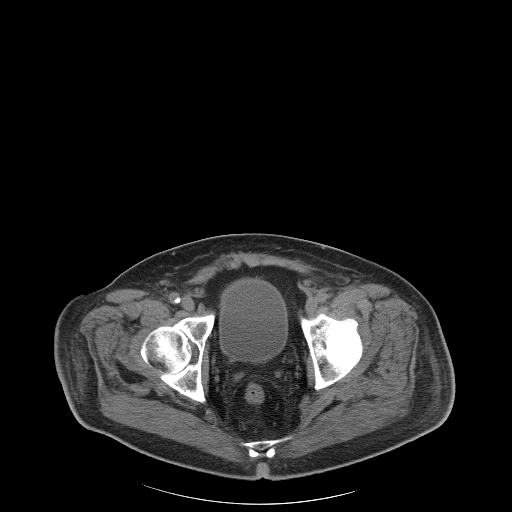
[im 24/54  soft-tissue]
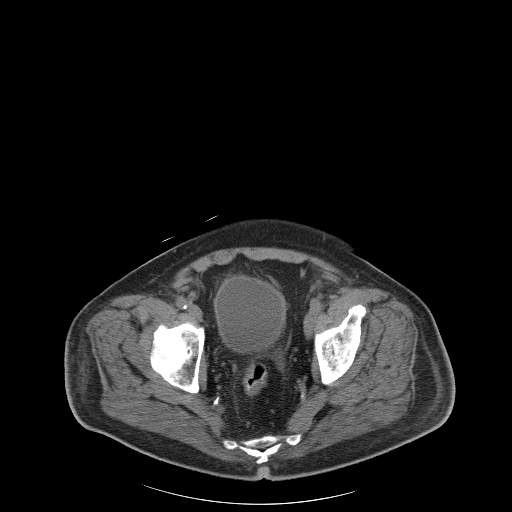
[im 30/54  soft-tissue]
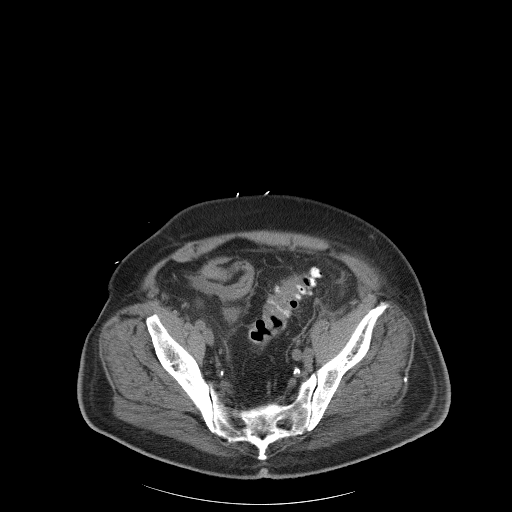
[im 33/54  soft-tissue]
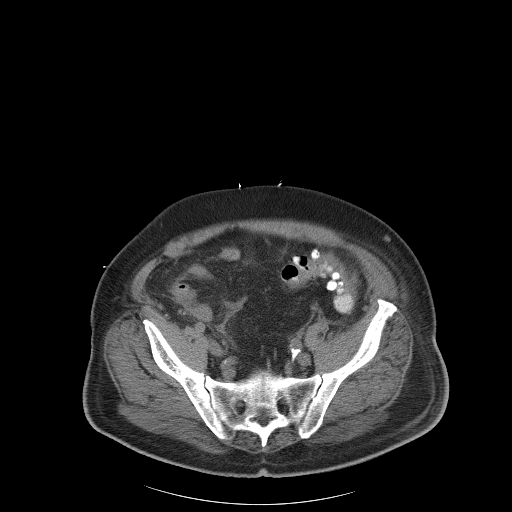
[im 33/54  bone]
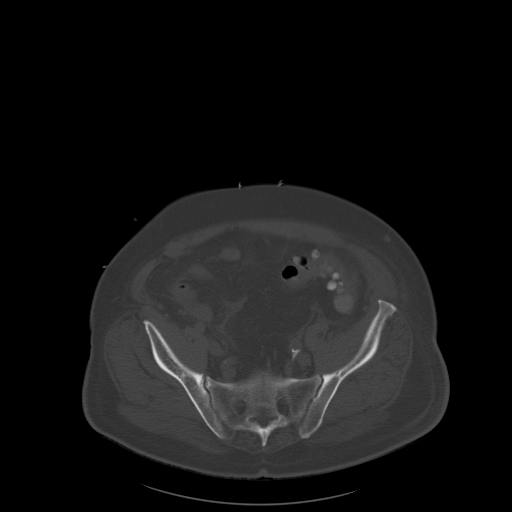
[im 36/54  soft-tissue]
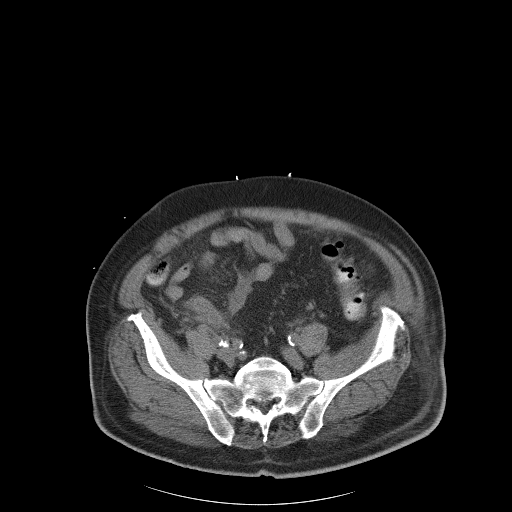
[im 40/54  soft-tissue]
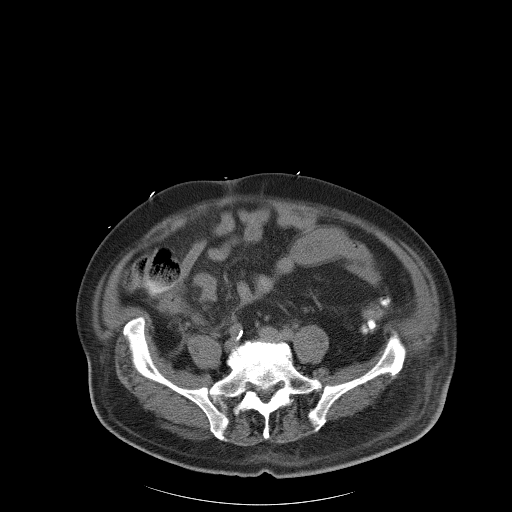
[im 43/54  soft-tissue]
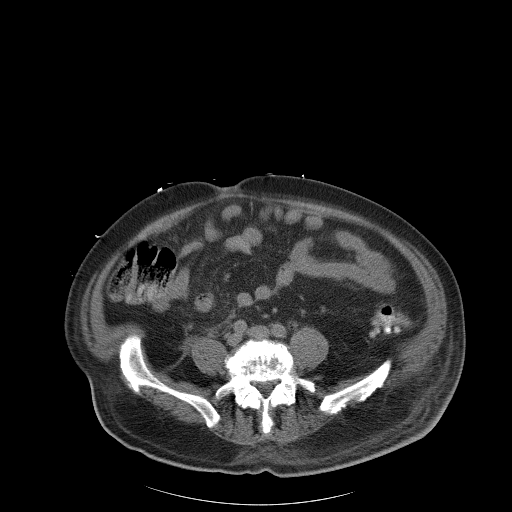
[im 47/54  soft-tissue]
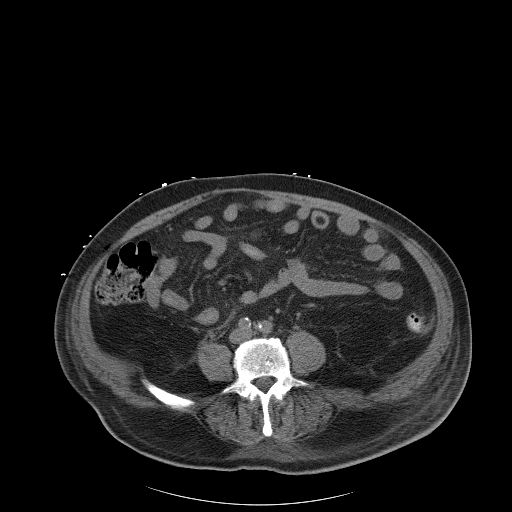
[im 50/54  soft-tissue]
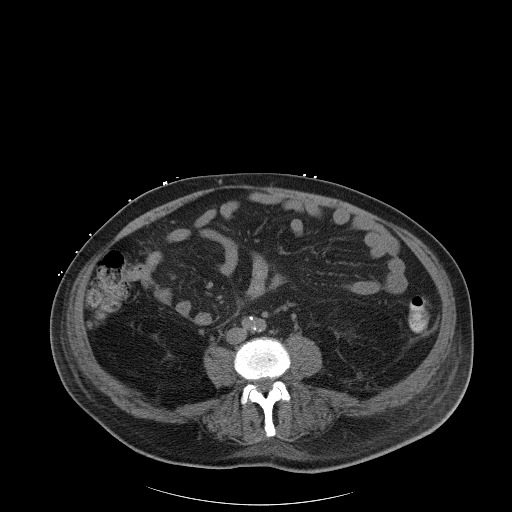

[Series 602: <mpr thick range> · coronal · 0.98mm/px · 3 of 99 slices shown]
[im 33/99  soft-tissue]
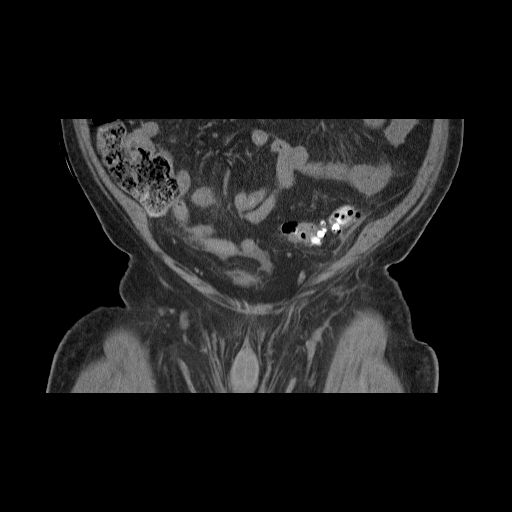
[im 44/99  soft-tissue]
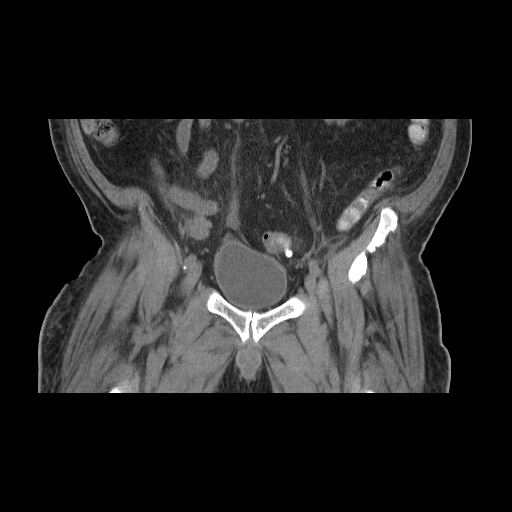
[im 55/99  soft-tissue]
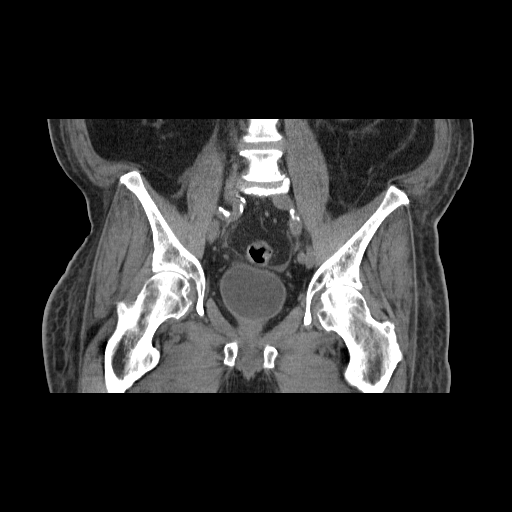

[17 of 46 positions shown; findings below may reference images not displayed]

FINDINGS: Lower chest:  Unremarkable.

Hepatobiliary: No mass visualized on this unenhanced exam.
Gallbladder is somewhat distended but otherwise unremarkable in
appearance.

Pancreas: No mass or inflammatory process visualized on this
unenhanced exam.

Spleen:  Within normal limits in size.

Adrenal Glands:  No masses identified.

Kidneys/Urinary tract: No evidence of urolithiasis or
hydronephrosis.

Stomach/Bowel/Peritoneum: Diverticulitis is again demonstrated with
decreased wall thickening and pericolonic inflammatory change seen
near the junction of the descending and sigmoid colon compared to
previous study. No evidence of abscess or free fluid. No evidence of
bowel obstruction.

Vascular/Lymphatic: No pathologically enlarged lymph nodes
identified. No other significant abnormality identified.

Reproductive:  No mass or other significant abnormality noted.

Other:  None.

Musculoskeletal:  No suspicious bone lesions identified.
IMPRESSION: Interval improvement in diverticulitis at the junction of the
descending and sigmoid colon. No evidence of abscess or other
complication.
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
## 2014-04-22 MED ORDER — CIPROFLOXACIN IN D5W 400 MG/200ML IV SOLN
400.0000 mg | Freq: Two times a day (BID) | INTRAVENOUS | Status: DC
  Administered 2014-04-22 – 2014-04-23 (×2): 400 mg via INTRAVENOUS
  Filled 2014-04-22 (×2): qty 200

## 2014-04-22 MED ORDER — INSULIN GLARGINE 100 UNIT/ML ~~LOC~~ SOLN
30.0000 [IU] | Freq: Every day | SUBCUTANEOUS | Status: DC
  Filled 2014-04-22: qty 0.3

## 2014-04-22 MED ORDER — INSULIN GLARGINE 100 UNIT/ML ~~LOC~~ SOLN
20.0000 [IU] | Freq: Every day | SUBCUTANEOUS | Status: DC
  Administered 2014-04-23: 20 [IU] via SUBCUTANEOUS
  Filled 2014-04-22 (×3): qty 0.2

## 2014-04-22 MED ORDER — SODIUM CHLORIDE 0.9 % IV SOLN
Freq: Once | INTRAVENOUS | Status: AC
  Administered 2014-04-22: 250 mL/h via INTRAVENOUS

## 2014-04-22 MED ORDER — CIPROFLOXACIN HCL 500 MG PO TABS: 500.0000 mg | ORAL_TABLET | Freq: Two times a day (BID) | ORAL | Status: DC

## 2014-04-22 MED ORDER — SODIUM CHLORIDE 0.9 % IV SOLN
INTRAVENOUS | Status: DC
  Administered 2014-04-22: 75 mL/h via INTRAVENOUS

## 2014-04-22 MED ORDER — METRONIDAZOLE 500 MG PO TABS: 500.0000 mg | ORAL_TABLET | Freq: Three times a day (TID) | ORAL | Status: DC

## 2014-04-22 MED ORDER — METRONIDAZOLE IN NACL 5-0.79 MG/ML-% IV SOLN
500.0000 mg | Freq: Three times a day (TID) | INTRAVENOUS | Status: DC
  Administered 2014-04-22 – 2014-04-23 (×4): 500 mg via INTRAVENOUS
  Filled 2014-04-22 (×4): qty 100

## 2014-04-22 MED ORDER — METOCLOPRAMIDE HCL 5 MG/ML IJ SOLN
5.0000 mg | Freq: Four times a day (QID) | INTRAMUSCULAR | Status: DC
  Administered 2014-04-22 – 2014-04-23 (×5): 5 mg via INTRAVENOUS
  Filled 2014-04-22 (×5): qty 2

## 2014-04-22 NOTE — Progress Notes (Signed)
[facility] PROGRESS NOTE  Peter [name] ZOX:[id number] DOB: Jul 06, 1930 DOA: 04/18/2014 PCP: PROVIDER NOT IN SYSTEM  Assessment/Plan: #1 acute diverticulitis Per CT of the abdomen and pelvis showing diverticulitis at the junction of descending colon and sigmoid. Patient with clinical improvement. Leukocytosis trending down. Patient states he's lost his appetite, patient with nausea and emesis. Will place patient on bowel rest. Repeat CT abd/pelvis. Change oral antibiotics back to IV ciprofloxacin and IV Flagyl.   #2 nausea and emesis Patient with nausea and emesis now over the past 2 days in the mornings. Unknown etiology. Patient with decreased appetite. KUB is negative. Will check a CT of the abdomen and pelvis as patient is being treated for an acute diverticulitis, to rule out small bowel obstruction. Will also get a gastric emptying study to rule out gastroparesis as patient does have a history of type 2 diabetes. Will place patient on bowel rest. Follow. If no improvement will need to consult with GI for further evaluation and management.  #3 hypertension Continue losartan. Beta blocker on hold secondary to bradycardia.  #4 well-controlled type 2 diabetes mellitus Hemoglobin A1c 6.6. CBGs have ranged from 39-121. Decrease Lantus to 20 units daily. Continue sliding scale insulin.  #5 chronic kidney disease stage III Stable. Follow.  #6 dementia Continue Namenda.  #7 near syncope Likely secondary to bradycardia secondary to beta blocker. Beta blocker on hold. Troponins negative. Carotid Dopplers with no significant ICA stenosis. 2-D echo with EF of 60-65% with no wall motion abnormalities. Follow. May need a Holter monitor as outpatient.  #8 bradycardia Beta blocker on hold as well as calcium channel blocker. Heart rate increased on ambulation. Follow.  #9 prophylaxis Lovenox for DVT prophylaxis.  Code Status: Full Family Communication: Updated patient and wife at  bedside. Disposition Plan: Home in 1-2 days.   Consultants:  None  Procedures:  CT abdomen and pelvis 04/18/2014  2-D echo 04/19/2014  Carotid Dopplers 04/19/2014  Antibiotics:  IV ciprofloxacin 04/18/2014  IV Flagyl 04/18/2014  HPI/Subjective: Patient states he feels significantly better than on admission. Patient with nausea and emesis again this morning. Patient states has lost his appetite.   Objective: Filed Vitals:   04/22/14 0426  BP: 159/51  Pulse: 54  Temp: 98.3 F (36.8 C)  Resp: 18    Intake/Output Summary (Last 24 hours) at 04/22/14 1324 Last data filed at 04/21/14 2300  Gross per 24 hour  Intake   1640 ml  Output      0 ml  Net   1640 ml   Filed Weights   04/18/14 1349 04/18/14 2141  Weight: 84.823 kg (187 lb) 85.503 kg (188 lb 8 oz)    Exam:   General:  NAD  Cardiovascular: RRR  Respiratory: CTAB  Abdomen: Soft, nondistended, positive bowel sounds, decreased tenderness to palpation in the left lower quadrant.  Musculoskeletal: No clubbing cyanosis or edema.  Data Reviewed: Basic Metabolic Panel:  Recent Labs Lab 04/18/14 1459 04/19/14 0340 04/20/14 0515 04/20/14 0748 04/21/14 0537 04/22/14 0540  NA 139 138  --  132* 139 138  K 4.6 4.0  --  3.8 3.7 4.0  CL 103 109  --  105 108 105  CO2 28 26  --  24 26 25   GLUCOSE 159* 146*  --  202* 49* 87  BUN 21 18  --  20 13 14   CREATININE 1.59* 1.35  --  1.33 1.27 1.31  CALCIUM 9.0 7.8*  --  7.4* 7.9* 8.0*  MG  --  1.6 1.7  --  2.4  --   PHOS  --  2.9  --   --   --   --    Liver Function Tests:  Recent Labs Lab 04/18/14 1459 04/19/14 0340  AST 22 14  ALT 24 16  ALKPHOS 91 64  BILITOT 1.2 1.2  PROT 7.5 5.7*  ALBUMIN 4.4 3.0*    Recent Labs Lab 04/18/14 1459  LIPASE 27   No results for input(s): AMMONIA in the last 168 hours. CBC:  Recent Labs Lab 04/18/14 1459 04/19/14 0340 04/20/14 0512 04/21/14 0537 04/22/14 0540  WBC 28.4* 20.3* 20.1* 13.9* 11.2*   NEUTROABS 25.9*  --   --  10.9*  --   HGB 14.6 11.1* 11.8* 10.2* 10.0*  HCT 42.7 32.8* 35.4* 30.5* 29.9*  MCV 84.6 85.0 85.1 84.3 84.9  PLT 309 266 227 239 244   Cardiac Enzymes:  Recent Labs Lab 04/18/14 2150 04/19/14 0340 04/19/14 0843  TROPONINI <0.03 <0.03 <0.03   BNP (last 3 results) No results for input(s): BNP in the last 8760 hours.  ProBNP (last 3 results) No results for input(s): PROBNP in the last 8760 hours.  CBG:  Recent Labs Lab 04/21/14 2354 04/22/14 0425 04/22/14 0514 04/22/14 0805 04/22/14 1252  GLUCAP 126* 39* 82 85 121*    No results found for this or any previous visit (from the past 240 hour(s)).   Studies: Dg Abd 1 View  04/21/2014   CLINICAL DATA:  Initial evaluation for diffuse abdominal pain with history of reflux and diabetes  EXAM: ABDOMEN - 1 VIEW  COMPARISON:  04/18/2014  FINDINGS: Foreign body material in the right paraspinous musculature in the mid lumbar region stable from prior study. There are no abnormally dilated loops of bowel. Contrast is seen into the rectum. Mild diverticulosis of the distal descending and sigmoid colon noted.  IMPRESSION: No acute finding   Electronically Signed   By: Skipper Cliche M.D.   On: 04/21/2014 10:32    Scheduled Meds: . allopurinol  100 mg Oral Daily  . aspirin EC  81 mg Oral Daily  . atorvastatin  10 mg Oral Daily  . ciprofloxacin  500 mg Oral BID  . docusate sodium  100 mg Oral BID  . donepezil  10 mg Oral BID  . doxazosin  2 mg Oral Daily  . enoxaparin (LOVENOX) injection  40 mg Subcutaneous Q24H  . feeding supplement (ENSURE COMPLETE)  237 mL Oral BID BM  . gabapentin  300 mg Oral BID  . hyoscyamine  0.25 mg Oral BID  . insulin aspart  0-9 Units Subcutaneous 6 times per day  . insulin glargine  30 Units Subcutaneous QHS  . losartan  100 mg Oral Daily  . memantine  10 mg Oral BID  . metroNIDAZOLE  500 mg Oral 3 times per day  . pantoprazole  40 mg Oral Daily  . sodium chloride  3 mL  Intravenous Q12H   Continuous Infusions:    Principal Problem:   Diverticulitis Active Problems:   Hypertension   DM (diabetes mellitus) type II controlled with renal manifestation   CKD (chronic kidney disease) stage 3, GFR 30-59 ml/min   Dementia   Near syncope   Bradycardia   Abdominal pain   Nausea with vomiting    Time spent: 40 minutes    THOMPSON,DANIEL M.D. [facility] Pager [phone]. If 7PM-7AM, please contact night-coverage at [email or web address], password [email or web address] 04/22/2014, 1:24 PM  LOS: 4 days

## 2014-04-22 NOTE — Progress Notes (Signed)
[facility]  [facility] is providing the following services: patient declined cane.  If patient discharges after hours, please call [phone].   Linward Headland 04/22/2014, 2:46 PM

## 2014-04-22 NOTE — Progress Notes (Signed)
At [place], patient's BS 39. Patient alert and oriented. Orange juice with sugar added was given. At 0514, BS was 82.

## 2014-04-22 NOTE — Progress Notes (Signed)
NUTRITION FOLLOW-UP  INTERVENTION: Increase Ensure Complete to po TID, each supplement provides 350 kcal and 13 grams of protein Provided education on low-fiber diet for diverticulitis per pt and pt's wife request Encourage PO intake RD to continue to monitor   NUTRITION DIAGNOSIS: Unintentional weight loss related to poor appetite as evidenced by 30 lb weight loss over 1 year, ongoing.  Goal: Pt to meet >/= 90% of their estimated nutrition needs, unmet   Monitor:  PO and supplemental intake, weight, labs, I/O's  ASSESSMENT: 81 year old male with past medical history of hyperlipidemia, GERD, gout, mild dementia, DM2, and HTN who presented to the [facility] on 04/18/14 with nausea, abdominal pain, and near syncopal episode. Patient also reports chills, decreased appetite, and weight loss.   Pt's diet was advanced to soft diet but pt was not eating meals and was dry heaving. Pt was then downgraded to a full liquid diet. Pt now NPO.   Provided pt with "Low Fiber Nutrition Therapy" and "High Fiber Nutrition therapy" handouts from the [employer]. Explained to pt what low and high fiber foods are. Explained the importance of consuming a low-fiber diet with diverticulitis and then slowly increasing fiber intake after discharge.  Pt was not eating well. PO intake: 25%. Pt states he was drinking the Ensure supplements. RD to increase order of Ensure when diet is advanced again.  Labs reviewed: WNL  Height: Ht Readings from Last 1 Encounters:  04/18/14 5\' 10"  (1.778 m)    Weight: Wt Readings from Last 1 Encounters:  04/18/14 188 lb 8 oz (85.503 kg)   BMI:  Body mass index is 27.05 kg/(m^2).  Estimated Nutritional Needs: Kcal: 2100-2200 Protein: 105-115g Fluid: 2.1L/day  Skin: intact  Diet Order: Diet NPO time specified Except for: Sips with Meds, Ice Chips  EDUCATION NEEDS: -Education needs addressed   Intake/Output Summary (Last 24 hours) at 04/22/14  1143 Last data filed at 04/21/14 2300  Gross per 24 hour  Intake   1640 ml  Output    400 ml  Net   1240 ml    Last BM: 3/10  Labs:   Recent Labs Lab 04/19/14 0340 04/20/14 0515 04/20/14 0748 04/21/14 0537 04/22/14 0540  NA 138  --  132* 139 138  K 4.0  --  3.8 3.7 4.0  CL 109  --  105 108 105  CO2 26  --  24 26 25   BUN 18  --  20 13 14   CREATININE 1.35  --  1.33 1.27 1.31  CALCIUM 7.8*  --  7.4* 7.9* 8.0*  MG 1.6 1.7  --  2.4  --   PHOS 2.9  --   --   --   --   GLUCOSE 146*  --  202* 49* 87    CBG (last 3)   Recent Labs  04/22/14 0425 04/22/14 0514 04/22/14 0805  GLUCAP 39* 82 85    Scheduled Meds: . allopurinol  100 mg Oral Daily  . aspirin EC  81 mg Oral Daily  . atorvastatin  10 mg Oral Daily  . ciprofloxacin  500 mg Oral BID  . docusate sodium  100 mg Oral BID  . donepezil  10 mg Oral BID  . doxazosin  2 mg Oral Daily  . enoxaparin (LOVENOX) injection  40 mg Subcutaneous Q24H  . feeding supplement (ENSURE COMPLETE)  237 mL Oral BID BM  . gabapentin  300 mg Oral BID  . hyoscyamine  0.25 mg  Oral BID  . insulin aspart  0-9 Units Subcutaneous 6 times per day  . insulin glargine  30 Units Subcutaneous QHS  . losartan  100 mg Oral Daily  . memantine  10 mg Oral BID  . metroNIDAZOLE  500 mg Oral 3 times per day  . pantoprazole  40 mg Oral Daily  . sodium chloride  3 mL Intravenous Q12H    Continuous Infusions:    Clayton Bibles, MS, RD, LDN Pager: [phone] After Hours Pager: [phone]

## 2014-04-22 NOTE — Progress Notes (Signed)
Physical Therapy Treatment Patient Details Name: Peter [name] MRN: [id number] DOB: 16-Oct-1930 Today's Date: 2014/05/06    History of Present Illness Pt is an 81 year old male admitted with acute diverticulitis with hx of DM, gout, mild dementia    PT Comments    Pt ambulated around unit again today, still a little unsteady however improved with distance.  Pt with nausea and small amount of clear emesis just after ambulating (RN notified).  Follow Up Recommendations  No PT follow up;Supervision for mobility/OOB     Equipment Recommendations  Cane    Recommendations for Other Services       Precautions / Restrictions Precautions Precautions: Fall    Mobility  Bed Mobility               General bed mobility comments: pt up in bathroom with NT on arrival  Transfers Overall transfer level: Needs assistance Equipment used: None Transfers: Sit to/from Stand Sit to Stand: Min guard         General transfer comment: for safety  Ambulation/Gait Ambulation/Gait assistance: Min guard Ambulation Distance (Feet): 400 Feet   Gait Pattern/deviations: Step-through pattern;Decreased stride length;Wide base of support     General Gait Details: pt unsteady initially however improved with distance, worse with increased pace so discussed slower cautious pace for safety at this time and pt agreeable, no overt LOB observed   Stairs            Wheelchair Mobility    Modified Rankin (Stroke Patients Only)       Balance                                    Cognition Arousal/Alertness: Awake/alert Behavior During Therapy: WFL for tasks assessed/performed Overall Cognitive Status: History of cognitive impairments - at baseline                      Exercises      General Comments        Pertinent Vitals/Pain Pain Assessment: 0-10 Pain Score: 2  Pain Location: abdomen Pain Descriptors / Indicators: Discomfort Pain Intervention(s):  Limited activity within patient's tolerance;Monitored during session;Repositioned    Home Living                      Prior Function            PT Goals (current goals can now be found in the care plan section) Progress towards PT goals: Progressing toward goals    Frequency  Min 3X/week    PT Plan Current plan remains appropriate    Co-evaluation             End of Session   Activity Tolerance: Patient tolerated treatment well Patient left: in chair;with call bell/phone within reach;with family/visitor present     Time: 1025-8527 PT Time Calculation (min) (ACUTE ONLY): 17 min  Charges:  $Gait Training: 8-22 mins                    G Codes:      Medford Staheli,KATHrine E 05-06-2014, 1:37 PM Carmelia Bake, PT, DPT May 06, 2014 Pager: [phone]

## 2014-04-23 LAB — BASIC METABOLIC PANEL
ANION GAP: 8 (ref 5–15)
BUN: 12 mg/dL (ref 6–23)
CO2: 25 mmol/L (ref 19–32)
Calcium: 8.1 mg/dL — ABNORMAL LOW (ref 8.4–10.5)
Chloride: 107 mmol/L (ref 96–112)
Creatinine, Ser: 1.28 mg/dL (ref 0.50–1.35)
GFR calc Af Amer: 59 mL/min — ABNORMAL LOW (ref >90)
GFR, EST NON AFRICAN AMERICAN: 51 mL/min — AB (ref >90)
Glucose, Bld: 122 mg/dL — ABNORMAL HIGH (ref 70–99)
Potassium: 4.2 mmol/L (ref 3.5–5.1)
SODIUM: 140 mmol/L (ref 135–145)

## 2014-04-23 LAB — CBC
HCT: 30.5 % — ABNORMAL LOW (ref 39.0–52.0)
HEMOGLOBIN: 10.1 g/dL — AB (ref 13.0–17.0)
MCH: 28.1 pg (ref 26.0–34.0)
MCHC: 33.1 g/dL (ref 30.0–36.0)
MCV: 85 fL (ref 78.0–100.0)
PLATELETS: 299 10*3/uL (ref 150–400)
RBC: 3.59 MIL/uL — AB (ref 4.22–5.81)
RDW: 13.4 % (ref 11.5–15.5)
WBC: 10.1 10*3/uL (ref 4.0–10.5)

## 2014-04-23 LAB — GLUCOSE, CAPILLARY
GLUCOSE-CAPILLARY: 196 mg/dL — AB (ref 70–99)
Glucose-Capillary: 117 mg/dL — ABNORMAL HIGH (ref 70–99)
Glucose-Capillary: 160 mg/dL — ABNORMAL HIGH (ref 70–99)
Glucose-Capillary: 285 mg/dL — ABNORMAL HIGH (ref 70–99)
Glucose-Capillary: 197 mg/dL — ABNORMAL HIGH (ref 70–99)

## 2014-04-23 MED ORDER — AMLODIPINE BESYLATE 10 MG PO TABS
10.0000 mg | ORAL_TABLET | Freq: Every day | ORAL | Status: DC
Start: 2014-04-23 — End: 2014-04-24
  Administered 2014-04-23 – 2014-04-24 (×2): 10 mg via ORAL
  Filled 2014-04-23 (×3): qty 1

## 2014-04-23 MED ORDER — CIPROFLOXACIN HCL 500 MG PO TABS
500.0000 mg | ORAL_TABLET | Freq: Two times a day (BID) | ORAL | Status: DC
Start: 2014-04-23 — End: 2014-04-24
  Administered 2014-04-23 – 2014-04-24 (×2): 500 mg via ORAL
  Filled 2014-04-23 (×3): qty 1

## 2014-04-23 MED ORDER — METRONIDAZOLE 500 MG PO TABS
500.0000 mg | ORAL_TABLET | Freq: Three times a day (TID) | ORAL | Status: DC
  Administered 2014-04-23 – 2014-04-24 (×3): 500 mg via ORAL
  Filled 2014-04-23 (×3): qty 1

## 2014-04-23 MED ORDER — METOCLOPRAMIDE HCL 10 MG PO TABS
5.0000 mg | ORAL_TABLET | Freq: Three times a day (TID) | ORAL | Status: DC
  Administered 2014-04-23 – 2014-04-24 (×3): 5 mg via ORAL
  Filled 2014-04-23 (×3): qty 1
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
## 2014-04-24 LAB — BASIC METABOLIC PANEL
Anion gap: 8 (ref 5–15)
BUN: 11 mg/dL (ref 6–23)
CO2: 26 mmol/L (ref 19–32)
Calcium: 8.1 mg/dL — ABNORMAL LOW (ref 8.4–10.5)
Chloride: 106 mmol/L (ref 96–112)
Creatinine, Ser: 1.26 mg/dL (ref 0.50–1.35)
GFR calc non Af Amer: 52 mL/min — ABNORMAL LOW (ref >90)
GFR, EST AFRICAN AMERICAN: 60 mL/min — AB (ref >90)
Glucose, Bld: 116 mg/dL — ABNORMAL HIGH (ref 70–99)
POTASSIUM: 3.9 mmol/L (ref 3.5–5.1)
Sodium: 140 mmol/L (ref 135–145)

## 2014-04-24 LAB — GLUCOSE, CAPILLARY
GLUCOSE-CAPILLARY: 108 mg/dL — AB (ref 70–99)
GLUCOSE-CAPILLARY: 159 mg/dL — AB (ref 70–99)
Glucose-Capillary: 132 mg/dL — ABNORMAL HIGH (ref 70–99)
Glucose-Capillary: 147 mg/dL — ABNORMAL HIGH (ref 70–99)

## 2014-04-24 LAB — CBC
HCT: 29.9 % — ABNORMAL LOW (ref 39.0–52.0)
Hemoglobin: 9.8 g/dL — ABNORMAL LOW (ref 13.0–17.0)
MCH: 27.8 pg (ref 26.0–34.0)
MCHC: 32.8 g/dL (ref 30.0–36.0)
MCV: 84.7 fL (ref 78.0–100.0)
PLATELETS: 312 10*3/uL (ref 150–400)
RBC: 3.53 MIL/uL — ABNORMAL LOW (ref 4.22–5.81)
RDW: 13.4 % (ref 11.5–15.5)
WBC: 8.8 10*3/uL (ref 4.0–10.5)

## 2014-04-24 MED ORDER — METOCLOPRAMIDE HCL 5 MG PO TABS: 5.0000 mg | ORAL_TABLET | Freq: Three times a day (TID) | ORAL | Status: DC

## 2014-04-24 MED ORDER — POLYETHYLENE GLYCOL 3350 17 G PO PACK: 17.0000 g | PACK | Freq: Every day | ORAL | Status: DC | PRN

## 2014-04-24 MED ORDER — CIPROFLOXACIN HCL 500 MG PO TABS: 500.0000 mg | ORAL_TABLET | Freq: Two times a day (BID) | ORAL | Status: DC

## 2014-04-24 MED ORDER — METRONIDAZOLE 500 MG PO TABS: 500.0000 mg | ORAL_TABLET | Freq: Three times a day (TID) | ORAL | Status: DC

## 2014-04-24 MED ORDER — ENSURE COMPLETE PO LIQD: 237.0000 mL | Freq: Two times a day (BID) | ORAL | Status: DC

## 2014-04-24 MED ORDER — INSULIN GLARGINE 100 UNIT/ML ~~LOC~~ SOLN: 20.0000 [IU] | Freq: Every day | SUBCUTANEOUS | Status: DC

## 2014-04-24 MED ORDER — FUROSEMIDE 40 MG PO TABS
40.0000 mg | ORAL_TABLET | Freq: Every day | ORAL | Status: DC
  Administered 2014-04-24: 40 mg via ORAL
  Filled 2014-04-24: qty 1

## 2014-04-24 NOTE — Progress Notes (Signed)
Medicare IM given  Jonnie Finner RN CCM Case Mgmt phone [phone].

## 2014-04-24 NOTE — Progress Notes (Signed)
Pt discharged to home. DC instructions given with wife and daughter at bedside. Prescriptions also given. No concerns voiced. Pt asked wife to sign discharge summary and she did. Brown envelope from radiology found in pt's chart given to patient's wife. Wife expressed thanks for same. Left unit in wheelchair pushed by nurse tech accompanied by wife and daughter. Left in good condition. Vwilliams,rn.
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
## 2015-06-15 DIAGNOSIS — G629 Polyneuropathy, unspecified: Secondary | ICD-10-CM | POA: Insufficient documentation

## 2015-06-15 DIAGNOSIS — E785 Hyperlipidemia, unspecified: Secondary | ICD-10-CM | POA: Insufficient documentation

## 2016-02-19 DIAGNOSIS — S91301A Unspecified open wound, right foot, initial encounter: Secondary | ICD-10-CM | POA: Diagnosis not present

## 2016-02-19 DIAGNOSIS — I96 Gangrene, not elsewhere classified: Secondary | ICD-10-CM | POA: Diagnosis not present

## 2016-02-19 DIAGNOSIS — L97519 Non-pressure chronic ulcer of other part of right foot with unspecified severity: Secondary | ICD-10-CM | POA: Diagnosis not present

## 2016-02-19 DIAGNOSIS — E1151 Type 2 diabetes mellitus with diabetic peripheral angiopathy without gangrene: Secondary | ICD-10-CM | POA: Diagnosis not present

## 2016-02-19 DIAGNOSIS — I739 Peripheral vascular disease, unspecified: Secondary | ICD-10-CM | POA: Diagnosis not present

## 2016-02-19 DIAGNOSIS — I70235 Atherosclerosis of native arteries of right leg with ulceration of other part of foot: Secondary | ICD-10-CM | POA: Diagnosis not present

## 2016-02-19 DIAGNOSIS — E11621 Type 2 diabetes mellitus with foot ulcer: Secondary | ICD-10-CM | POA: Diagnosis not present

## 2016-02-19 DIAGNOSIS — L97512 Non-pressure chronic ulcer of other part of right foot with fat layer exposed: Secondary | ICD-10-CM | POA: Diagnosis not present

## 2016-02-20 DIAGNOSIS — L97519 Non-pressure chronic ulcer of other part of right foot with unspecified severity: Secondary | ICD-10-CM | POA: Diagnosis not present

## 2016-02-20 DIAGNOSIS — E11621 Type 2 diabetes mellitus with foot ulcer: Secondary | ICD-10-CM | POA: Diagnosis not present

## 2016-02-20 DIAGNOSIS — I739 Peripheral vascular disease, unspecified: Secondary | ICD-10-CM | POA: Diagnosis not present

## 2016-02-20 DIAGNOSIS — E1151 Type 2 diabetes mellitus with diabetic peripheral angiopathy without gangrene: Secondary | ICD-10-CM | POA: Diagnosis not present

## 2016-02-20 DIAGNOSIS — I96 Gangrene, not elsewhere classified: Secondary | ICD-10-CM | POA: Diagnosis not present

## 2016-02-22 DIAGNOSIS — E1151 Type 2 diabetes mellitus with diabetic peripheral angiopathy without gangrene: Secondary | ICD-10-CM | POA: Diagnosis not present

## 2016-02-22 DIAGNOSIS — E11621 Type 2 diabetes mellitus with foot ulcer: Secondary | ICD-10-CM | POA: Diagnosis not present

## 2016-02-22 DIAGNOSIS — L97519 Non-pressure chronic ulcer of other part of right foot with unspecified severity: Secondary | ICD-10-CM | POA: Diagnosis not present

## 2016-02-22 DIAGNOSIS — I96 Gangrene, not elsewhere classified: Secondary | ICD-10-CM | POA: Diagnosis not present

## 2016-02-27 DIAGNOSIS — I10 Essential (primary) hypertension: Secondary | ICD-10-CM | POA: Diagnosis not present

## 2016-02-27 DIAGNOSIS — I70245 Atherosclerosis of native arteries of left leg with ulceration of other part of foot: Secondary | ICD-10-CM | POA: Diagnosis not present

## 2016-02-27 DIAGNOSIS — E11621 Type 2 diabetes mellitus with foot ulcer: Secondary | ICD-10-CM | POA: Diagnosis not present

## 2016-02-27 DIAGNOSIS — Z880 Allergy status to penicillin: Secondary | ICD-10-CM | POA: Diagnosis not present

## 2016-02-27 DIAGNOSIS — E785 Hyperlipidemia, unspecified: Secondary | ICD-10-CM | POA: Diagnosis not present

## 2016-02-27 DIAGNOSIS — Z9862 Peripheral vascular angioplasty status: Secondary | ICD-10-CM | POA: Diagnosis not present

## 2016-02-27 DIAGNOSIS — Z794 Long term (current) use of insulin: Secondary | ICD-10-CM | POA: Diagnosis not present

## 2016-02-27 DIAGNOSIS — M109 Gout, unspecified: Secondary | ICD-10-CM | POA: Diagnosis not present

## 2016-02-27 DIAGNOSIS — L97523 Non-pressure chronic ulcer of other part of left foot with necrosis of muscle: Secondary | ICD-10-CM | POA: Diagnosis not present

## 2016-02-27 DIAGNOSIS — T8789 Other complications of amputation stump: Secondary | ICD-10-CM | POA: Diagnosis not present

## 2016-02-27 DIAGNOSIS — Z89421 Acquired absence of other right toe(s): Secondary | ICD-10-CM | POA: Diagnosis not present

## 2016-02-27 DIAGNOSIS — E1142 Type 2 diabetes mellitus with diabetic polyneuropathy: Secondary | ICD-10-CM | POA: Diagnosis not present

## 2016-02-29 DIAGNOSIS — I2510 Atherosclerotic heart disease of native coronary artery without angina pectoris: Secondary | ICD-10-CM | POA: Diagnosis not present

## 2016-02-29 DIAGNOSIS — E11621 Type 2 diabetes mellitus with foot ulcer: Secondary | ICD-10-CM | POA: Diagnosis not present

## 2016-02-29 DIAGNOSIS — T82856A Stenosis of peripheral vascular stent, initial encounter: Secondary | ICD-10-CM | POA: Diagnosis not present

## 2016-02-29 DIAGNOSIS — S98149A Partial traumatic amputation of one unspecified lesser toe, initial encounter: Secondary | ICD-10-CM | POA: Diagnosis not present

## 2016-02-29 DIAGNOSIS — E1151 Type 2 diabetes mellitus with diabetic peripheral angiopathy without gangrene: Secondary | ICD-10-CM | POA: Diagnosis not present

## 2016-02-29 DIAGNOSIS — I739 Peripheral vascular disease, unspecified: Secondary | ICD-10-CM | POA: Diagnosis not present

## 2016-02-29 DIAGNOSIS — L97519 Non-pressure chronic ulcer of other part of right foot with unspecified severity: Secondary | ICD-10-CM | POA: Diagnosis not present

## 2016-02-29 DIAGNOSIS — Z89421 Acquired absence of other right toe(s): Secondary | ICD-10-CM | POA: Diagnosis not present

## 2016-02-29 DIAGNOSIS — I7092 Chronic total occlusion of artery of the extremities: Secondary | ICD-10-CM | POA: Diagnosis not present

## 2016-02-29 DIAGNOSIS — I70235 Atherosclerosis of native arteries of right leg with ulceration of other part of foot: Secondary | ICD-10-CM | POA: Diagnosis not present

## 2016-03-04 DIAGNOSIS — E11621 Type 2 diabetes mellitus with foot ulcer: Secondary | ICD-10-CM | POA: Diagnosis not present

## 2016-03-04 DIAGNOSIS — L97513 Non-pressure chronic ulcer of other part of right foot with necrosis of muscle: Secondary | ICD-10-CM | POA: Diagnosis not present

## 2016-03-04 DIAGNOSIS — L97519 Non-pressure chronic ulcer of other part of right foot with unspecified severity: Secondary | ICD-10-CM | POA: Diagnosis not present

## 2016-03-04 DIAGNOSIS — I70235 Atherosclerosis of native arteries of right leg with ulceration of other part of foot: Secondary | ICD-10-CM | POA: Diagnosis not present

## 2016-03-04 DIAGNOSIS — E1151 Type 2 diabetes mellitus with diabetic peripheral angiopathy without gangrene: Secondary | ICD-10-CM | POA: Diagnosis not present

## 2016-03-04 DIAGNOSIS — I96 Gangrene, not elsewhere classified: Secondary | ICD-10-CM | POA: Diagnosis not present

## 2016-03-05 DIAGNOSIS — E11621 Type 2 diabetes mellitus with foot ulcer: Secondary | ICD-10-CM | POA: Diagnosis not present

## 2016-03-05 DIAGNOSIS — I739 Peripheral vascular disease, unspecified: Secondary | ICD-10-CM | POA: Diagnosis not present

## 2016-03-05 DIAGNOSIS — I96 Gangrene, not elsewhere classified: Secondary | ICD-10-CM | POA: Diagnosis not present

## 2016-03-05 DIAGNOSIS — E1151 Type 2 diabetes mellitus with diabetic peripheral angiopathy without gangrene: Secondary | ICD-10-CM | POA: Diagnosis not present

## 2016-03-05 DIAGNOSIS — L97519 Non-pressure chronic ulcer of other part of right foot with unspecified severity: Secondary | ICD-10-CM | POA: Diagnosis not present

## 2016-03-07 DIAGNOSIS — Z01818 Encounter for other preprocedural examination: Secondary | ICD-10-CM | POA: Diagnosis not present

## 2016-03-07 DIAGNOSIS — M86671 Other chronic osteomyelitis, right ankle and foot: Secondary | ICD-10-CM | POA: Diagnosis not present

## 2016-03-07 DIAGNOSIS — Z23 Encounter for immunization: Secondary | ICD-10-CM | POA: Diagnosis not present

## 2016-03-12 DIAGNOSIS — I96 Gangrene, not elsewhere classified: Secondary | ICD-10-CM | POA: Diagnosis not present

## 2016-03-12 DIAGNOSIS — E1151 Type 2 diabetes mellitus with diabetic peripheral angiopathy without gangrene: Secondary | ICD-10-CM | POA: Diagnosis not present

## 2016-03-12 DIAGNOSIS — E11621 Type 2 diabetes mellitus with foot ulcer: Secondary | ICD-10-CM | POA: Diagnosis not present

## 2016-03-12 DIAGNOSIS — L97519 Non-pressure chronic ulcer of other part of right foot with unspecified severity: Secondary | ICD-10-CM | POA: Diagnosis not present

## 2016-03-25 DIAGNOSIS — I70245 Atherosclerosis of native arteries of left leg with ulceration of other part of foot: Secondary | ICD-10-CM | POA: Diagnosis not present

## 2016-03-25 DIAGNOSIS — L97512 Non-pressure chronic ulcer of other part of right foot with fat layer exposed: Secondary | ICD-10-CM | POA: Diagnosis not present

## 2016-03-25 DIAGNOSIS — I96 Gangrene, not elsewhere classified: Secondary | ICD-10-CM | POA: Diagnosis not present

## 2016-03-25 DIAGNOSIS — L97529 Non-pressure chronic ulcer of other part of left foot with unspecified severity: Secondary | ICD-10-CM | POA: Diagnosis not present

## 2016-04-03 DIAGNOSIS — L97514 Non-pressure chronic ulcer of other part of right foot with necrosis of bone: Secondary | ICD-10-CM | POA: Diagnosis not present

## 2016-04-03 DIAGNOSIS — I96 Gangrene, not elsewhere classified: Secondary | ICD-10-CM | POA: Diagnosis not present

## 2016-04-03 DIAGNOSIS — L97529 Non-pressure chronic ulcer of other part of left foot with unspecified severity: Secondary | ICD-10-CM | POA: Diagnosis not present

## 2016-04-08 DIAGNOSIS — I96 Gangrene, not elsewhere classified: Secondary | ICD-10-CM | POA: Diagnosis not present

## 2016-04-08 DIAGNOSIS — L97529 Non-pressure chronic ulcer of other part of left foot with unspecified severity: Secondary | ICD-10-CM | POA: Diagnosis not present

## 2016-04-08 DIAGNOSIS — I70235 Atherosclerosis of native arteries of right leg with ulceration of other part of foot: Secondary | ICD-10-CM | POA: Diagnosis not present

## 2016-04-09 DIAGNOSIS — I10 Essential (primary) hypertension: Secondary | ICD-10-CM | POA: Diagnosis not present

## 2016-04-09 DIAGNOSIS — L97514 Non-pressure chronic ulcer of other part of right foot with necrosis of bone: Secondary | ICD-10-CM | POA: Diagnosis not present

## 2016-04-09 DIAGNOSIS — I96 Gangrene, not elsewhere classified: Secondary | ICD-10-CM | POA: Diagnosis not present

## 2016-04-09 DIAGNOSIS — I739 Peripheral vascular disease, unspecified: Secondary | ICD-10-CM | POA: Diagnosis not present

## 2016-04-09 DIAGNOSIS — M86171 Other acute osteomyelitis, right ankle and foot: Secondary | ICD-10-CM | POA: Diagnosis not present

## 2016-04-09 DIAGNOSIS — Z794 Long term (current) use of insulin: Secondary | ICD-10-CM | POA: Diagnosis not present

## 2016-04-09 DIAGNOSIS — I70235 Atherosclerosis of native arteries of right leg with ulceration of other part of foot: Secondary | ICD-10-CM | POA: Diagnosis not present

## 2016-04-09 DIAGNOSIS — M79671 Pain in right foot: Secondary | ICD-10-CM | POA: Diagnosis not present

## 2016-04-09 DIAGNOSIS — I129 Hypertensive chronic kidney disease with stage 1 through stage 4 chronic kidney disease, or unspecified chronic kidney disease: Secondary | ICD-10-CM | POA: Diagnosis not present

## 2016-04-09 DIAGNOSIS — E1169 Type 2 diabetes mellitus with other specified complication: Secondary | ICD-10-CM | POA: Diagnosis not present

## 2016-04-09 DIAGNOSIS — I6522 Occlusion and stenosis of left carotid artery: Secondary | ICD-10-CM | POA: Diagnosis not present

## 2016-04-09 DIAGNOSIS — Z8673 Personal history of transient ischemic attack (TIA), and cerebral infarction without residual deficits: Secondary | ICD-10-CM | POA: Diagnosis not present

## 2016-04-09 DIAGNOSIS — Z87891 Personal history of nicotine dependence: Secondary | ICD-10-CM | POA: Diagnosis not present

## 2016-04-09 DIAGNOSIS — M86671 Other chronic osteomyelitis, right ankle and foot: Secondary | ICD-10-CM | POA: Diagnosis not present

## 2016-04-09 DIAGNOSIS — G8918 Other acute postprocedural pain: Secondary | ICD-10-CM | POA: Diagnosis not present

## 2016-04-09 DIAGNOSIS — Z89421 Acquired absence of other right toe(s): Secondary | ICD-10-CM | POA: Diagnosis not present

## 2016-04-09 DIAGNOSIS — N183 Chronic kidney disease, stage 3 (moderate): Secondary | ICD-10-CM | POA: Diagnosis not present

## 2016-04-09 DIAGNOSIS — E1152 Type 2 diabetes mellitus with diabetic peripheral angiopathy with gangrene: Secondary | ICD-10-CM | POA: Diagnosis not present

## 2016-04-09 DIAGNOSIS — R55 Syncope and collapse: Secondary | ICD-10-CM | POA: Diagnosis not present

## 2016-04-09 DIAGNOSIS — G629 Polyneuropathy, unspecified: Secondary | ICD-10-CM | POA: Diagnosis not present

## 2016-04-09 DIAGNOSIS — F0390 Unspecified dementia without behavioral disturbance: Secondary | ICD-10-CM | POA: Diagnosis not present

## 2016-04-09 DIAGNOSIS — E1122 Type 2 diabetes mellitus with diabetic chronic kidney disease: Secondary | ICD-10-CM | POA: Diagnosis not present

## 2016-04-09 DIAGNOSIS — E1352 Other specified diabetes mellitus with diabetic peripheral angiopathy with gangrene: Secondary | ICD-10-CM | POA: Diagnosis not present

## 2016-04-10 DIAGNOSIS — F0390 Unspecified dementia without behavioral disturbance: Secondary | ICD-10-CM | POA: Diagnosis not present

## 2016-04-10 DIAGNOSIS — R55 Syncope and collapse: Secondary | ICD-10-CM | POA: Diagnosis not present

## 2016-04-10 DIAGNOSIS — M869 Osteomyelitis, unspecified: Secondary | ICD-10-CM | POA: Diagnosis not present

## 2016-04-10 DIAGNOSIS — R410 Disorientation, unspecified: Secondary | ICD-10-CM | POA: Diagnosis not present

## 2016-04-15 DIAGNOSIS — E1142 Type 2 diabetes mellitus with diabetic polyneuropathy: Secondary | ICD-10-CM | POA: Diagnosis not present

## 2016-04-15 DIAGNOSIS — E11621 Type 2 diabetes mellitus with foot ulcer: Secondary | ICD-10-CM | POA: Diagnosis not present

## 2016-04-15 DIAGNOSIS — E1151 Type 2 diabetes mellitus with diabetic peripheral angiopathy without gangrene: Secondary | ICD-10-CM | POA: Diagnosis not present

## 2016-04-15 DIAGNOSIS — L97512 Non-pressure chronic ulcer of other part of right foot with fat layer exposed: Secondary | ICD-10-CM | POA: Diagnosis not present

## 2016-04-19 DIAGNOSIS — Z4781 Encounter for orthopedic aftercare following surgical amputation: Secondary | ICD-10-CM | POA: Diagnosis not present

## 2016-04-19 DIAGNOSIS — E1151 Type 2 diabetes mellitus with diabetic peripheral angiopathy without gangrene: Secondary | ICD-10-CM | POA: Diagnosis not present

## 2016-04-19 DIAGNOSIS — Z7902 Long term (current) use of antithrombotics/antiplatelets: Secondary | ICD-10-CM | POA: Diagnosis not present

## 2016-04-19 DIAGNOSIS — E1122 Type 2 diabetes mellitus with diabetic chronic kidney disease: Secondary | ICD-10-CM | POA: Diagnosis not present

## 2016-04-19 DIAGNOSIS — Z89421 Acquired absence of other right toe(s): Secondary | ICD-10-CM | POA: Diagnosis not present

## 2016-04-19 DIAGNOSIS — Z7982 Long term (current) use of aspirin: Secondary | ICD-10-CM | POA: Diagnosis not present

## 2016-04-19 DIAGNOSIS — I129 Hypertensive chronic kidney disease with stage 1 through stage 4 chronic kidney disease, or unspecified chronic kidney disease: Secondary | ICD-10-CM | POA: Diagnosis not present

## 2016-04-19 DIAGNOSIS — Z79891 Long term (current) use of opiate analgesic: Secondary | ICD-10-CM | POA: Diagnosis not present

## 2016-04-19 DIAGNOSIS — Z4801 Encounter for change or removal of surgical wound dressing: Secondary | ICD-10-CM | POA: Diagnosis not present

## 2016-04-19 DIAGNOSIS — F0390 Unspecified dementia without behavioral disturbance: Secondary | ICD-10-CM | POA: Diagnosis not present

## 2016-04-19 DIAGNOSIS — Z794 Long term (current) use of insulin: Secondary | ICD-10-CM | POA: Diagnosis not present

## 2016-04-19 DIAGNOSIS — N189 Chronic kidney disease, unspecified: Secondary | ICD-10-CM | POA: Diagnosis not present

## 2016-04-19 DIAGNOSIS — D649 Anemia, unspecified: Secondary | ICD-10-CM | POA: Diagnosis not present

## 2016-04-21 DIAGNOSIS — Z4781 Encounter for orthopedic aftercare following surgical amputation: Secondary | ICD-10-CM | POA: Diagnosis not present

## 2016-04-21 DIAGNOSIS — E1151 Type 2 diabetes mellitus with diabetic peripheral angiopathy without gangrene: Secondary | ICD-10-CM | POA: Diagnosis not present

## 2016-04-21 DIAGNOSIS — I129 Hypertensive chronic kidney disease with stage 1 through stage 4 chronic kidney disease, or unspecified chronic kidney disease: Secondary | ICD-10-CM | POA: Diagnosis not present

## 2016-04-21 DIAGNOSIS — N189 Chronic kidney disease, unspecified: Secondary | ICD-10-CM | POA: Diagnosis not present

## 2016-04-21 DIAGNOSIS — D649 Anemia, unspecified: Secondary | ICD-10-CM | POA: Diagnosis not present

## 2016-04-21 DIAGNOSIS — E1122 Type 2 diabetes mellitus with diabetic chronic kidney disease: Secondary | ICD-10-CM | POA: Diagnosis not present

## 2016-04-22 DIAGNOSIS — E11621 Type 2 diabetes mellitus with foot ulcer: Secondary | ICD-10-CM | POA: Diagnosis not present

## 2016-04-22 DIAGNOSIS — E1151 Type 2 diabetes mellitus with diabetic peripheral angiopathy without gangrene: Secondary | ICD-10-CM | POA: Diagnosis not present

## 2016-04-22 DIAGNOSIS — L97512 Non-pressure chronic ulcer of other part of right foot with fat layer exposed: Secondary | ICD-10-CM | POA: Diagnosis not present

## 2016-04-22 DIAGNOSIS — E1142 Type 2 diabetes mellitus with diabetic polyneuropathy: Secondary | ICD-10-CM | POA: Diagnosis not present

## 2016-04-24 DIAGNOSIS — E1151 Type 2 diabetes mellitus with diabetic peripheral angiopathy without gangrene: Secondary | ICD-10-CM | POA: Diagnosis not present

## 2016-04-24 DIAGNOSIS — I129 Hypertensive chronic kidney disease with stage 1 through stage 4 chronic kidney disease, or unspecified chronic kidney disease: Secondary | ICD-10-CM | POA: Diagnosis not present

## 2016-04-24 DIAGNOSIS — E1122 Type 2 diabetes mellitus with diabetic chronic kidney disease: Secondary | ICD-10-CM | POA: Diagnosis not present

## 2016-04-24 DIAGNOSIS — N189 Chronic kidney disease, unspecified: Secondary | ICD-10-CM | POA: Diagnosis not present

## 2016-04-24 DIAGNOSIS — Z4781 Encounter for orthopedic aftercare following surgical amputation: Secondary | ICD-10-CM | POA: Diagnosis not present

## 2016-04-24 DIAGNOSIS — D649 Anemia, unspecified: Secondary | ICD-10-CM | POA: Diagnosis not present

## 2016-04-26 DIAGNOSIS — N189 Chronic kidney disease, unspecified: Secondary | ICD-10-CM | POA: Diagnosis not present

## 2016-04-26 DIAGNOSIS — E1151 Type 2 diabetes mellitus with diabetic peripheral angiopathy without gangrene: Secondary | ICD-10-CM | POA: Diagnosis not present

## 2016-04-26 DIAGNOSIS — E1122 Type 2 diabetes mellitus with diabetic chronic kidney disease: Secondary | ICD-10-CM | POA: Diagnosis not present

## 2016-04-26 DIAGNOSIS — Z4781 Encounter for orthopedic aftercare following surgical amputation: Secondary | ICD-10-CM | POA: Diagnosis not present

## 2016-04-26 DIAGNOSIS — I129 Hypertensive chronic kidney disease with stage 1 through stage 4 chronic kidney disease, or unspecified chronic kidney disease: Secondary | ICD-10-CM | POA: Diagnosis not present

## 2016-04-26 DIAGNOSIS — D649 Anemia, unspecified: Secondary | ICD-10-CM | POA: Diagnosis not present

## 2016-04-29 DIAGNOSIS — I129 Hypertensive chronic kidney disease with stage 1 through stage 4 chronic kidney disease, or unspecified chronic kidney disease: Secondary | ICD-10-CM | POA: Diagnosis not present

## 2016-04-29 DIAGNOSIS — E1122 Type 2 diabetes mellitus with diabetic chronic kidney disease: Secondary | ICD-10-CM | POA: Diagnosis not present

## 2016-04-29 DIAGNOSIS — E1151 Type 2 diabetes mellitus with diabetic peripheral angiopathy without gangrene: Secondary | ICD-10-CM | POA: Diagnosis not present

## 2016-04-29 DIAGNOSIS — D649 Anemia, unspecified: Secondary | ICD-10-CM | POA: Diagnosis not present

## 2016-04-29 DIAGNOSIS — Z4781 Encounter for orthopedic aftercare following surgical amputation: Secondary | ICD-10-CM | POA: Diagnosis not present

## 2016-04-29 DIAGNOSIS — N189 Chronic kidney disease, unspecified: Secondary | ICD-10-CM | POA: Diagnosis not present

## 2016-05-01 DIAGNOSIS — D649 Anemia, unspecified: Secondary | ICD-10-CM | POA: Diagnosis not present

## 2016-05-01 DIAGNOSIS — I129 Hypertensive chronic kidney disease with stage 1 through stage 4 chronic kidney disease, or unspecified chronic kidney disease: Secondary | ICD-10-CM | POA: Diagnosis not present

## 2016-05-01 DIAGNOSIS — Z4781 Encounter for orthopedic aftercare following surgical amputation: Secondary | ICD-10-CM | POA: Diagnosis not present

## 2016-05-01 DIAGNOSIS — E1151 Type 2 diabetes mellitus with diabetic peripheral angiopathy without gangrene: Secondary | ICD-10-CM | POA: Diagnosis not present

## 2016-05-01 DIAGNOSIS — N189 Chronic kidney disease, unspecified: Secondary | ICD-10-CM | POA: Diagnosis not present

## 2016-05-01 DIAGNOSIS — E1122 Type 2 diabetes mellitus with diabetic chronic kidney disease: Secondary | ICD-10-CM | POA: Diagnosis not present

## 2016-05-02 DIAGNOSIS — M25569 Pain in unspecified knee: Secondary | ICD-10-CM | POA: Diagnosis not present

## 2016-05-02 DIAGNOSIS — M1711 Unilateral primary osteoarthritis, right knee: Secondary | ICD-10-CM | POA: Diagnosis not present

## 2016-05-03 DIAGNOSIS — Z4781 Encounter for orthopedic aftercare following surgical amputation: Secondary | ICD-10-CM | POA: Diagnosis not present

## 2016-05-03 DIAGNOSIS — N189 Chronic kidney disease, unspecified: Secondary | ICD-10-CM | POA: Diagnosis not present

## 2016-05-03 DIAGNOSIS — I129 Hypertensive chronic kidney disease with stage 1 through stage 4 chronic kidney disease, or unspecified chronic kidney disease: Secondary | ICD-10-CM | POA: Diagnosis not present

## 2016-05-03 DIAGNOSIS — E1122 Type 2 diabetes mellitus with diabetic chronic kidney disease: Secondary | ICD-10-CM | POA: Diagnosis not present

## 2016-05-03 DIAGNOSIS — E1151 Type 2 diabetes mellitus with diabetic peripheral angiopathy without gangrene: Secondary | ICD-10-CM | POA: Diagnosis not present

## 2016-05-03 DIAGNOSIS — D649 Anemia, unspecified: Secondary | ICD-10-CM | POA: Diagnosis not present

## 2016-05-06 DIAGNOSIS — G8929 Other chronic pain: Secondary | ICD-10-CM | POA: Insufficient documentation

## 2016-05-06 DIAGNOSIS — E11621 Type 2 diabetes mellitus with foot ulcer: Secondary | ICD-10-CM | POA: Diagnosis not present

## 2016-05-06 DIAGNOSIS — E1151 Type 2 diabetes mellitus with diabetic peripheral angiopathy without gangrene: Secondary | ICD-10-CM | POA: Diagnosis not present

## 2016-05-06 DIAGNOSIS — M25561 Pain in right knee: Secondary | ICD-10-CM | POA: Diagnosis not present

## 2016-05-06 DIAGNOSIS — L97512 Non-pressure chronic ulcer of other part of right foot with fat layer exposed: Secondary | ICD-10-CM | POA: Diagnosis not present

## 2016-05-06 DIAGNOSIS — E1142 Type 2 diabetes mellitus with diabetic polyneuropathy: Secondary | ICD-10-CM | POA: Diagnosis not present

## 2016-05-08 DIAGNOSIS — I129 Hypertensive chronic kidney disease with stage 1 through stage 4 chronic kidney disease, or unspecified chronic kidney disease: Secondary | ICD-10-CM | POA: Diagnosis not present

## 2016-05-08 DIAGNOSIS — D649 Anemia, unspecified: Secondary | ICD-10-CM | POA: Diagnosis not present

## 2016-05-08 DIAGNOSIS — E1151 Type 2 diabetes mellitus with diabetic peripheral angiopathy without gangrene: Secondary | ICD-10-CM | POA: Diagnosis not present

## 2016-05-08 DIAGNOSIS — Z4781 Encounter for orthopedic aftercare following surgical amputation: Secondary | ICD-10-CM | POA: Diagnosis not present

## 2016-05-08 DIAGNOSIS — N189 Chronic kidney disease, unspecified: Secondary | ICD-10-CM | POA: Diagnosis not present

## 2016-05-08 DIAGNOSIS — E1122 Type 2 diabetes mellitus with diabetic chronic kidney disease: Secondary | ICD-10-CM | POA: Diagnosis not present

## 2016-05-10 DIAGNOSIS — N189 Chronic kidney disease, unspecified: Secondary | ICD-10-CM | POA: Diagnosis not present

## 2016-05-10 DIAGNOSIS — M79641 Pain in right hand: Secondary | ICD-10-CM | POA: Diagnosis not present

## 2016-05-10 DIAGNOSIS — E1122 Type 2 diabetes mellitus with diabetic chronic kidney disease: Secondary | ICD-10-CM | POA: Diagnosis not present

## 2016-05-10 DIAGNOSIS — I129 Hypertensive chronic kidney disease with stage 1 through stage 4 chronic kidney disease, or unspecified chronic kidney disease: Secondary | ICD-10-CM | POA: Diagnosis not present

## 2016-05-10 DIAGNOSIS — Z4781 Encounter for orthopedic aftercare following surgical amputation: Secondary | ICD-10-CM | POA: Diagnosis not present

## 2016-05-10 DIAGNOSIS — D649 Anemia, unspecified: Secondary | ICD-10-CM | POA: Diagnosis not present

## 2016-05-10 DIAGNOSIS — M6588 Other synovitis and tenosynovitis, other site: Secondary | ICD-10-CM | POA: Diagnosis not present

## 2016-05-10 DIAGNOSIS — E1151 Type 2 diabetes mellitus with diabetic peripheral angiopathy without gangrene: Secondary | ICD-10-CM | POA: Diagnosis not present

## 2016-05-14 DIAGNOSIS — L97512 Non-pressure chronic ulcer of other part of right foot with fat layer exposed: Secondary | ICD-10-CM | POA: Diagnosis not present

## 2016-05-14 DIAGNOSIS — E1142 Type 2 diabetes mellitus with diabetic polyneuropathy: Secondary | ICD-10-CM | POA: Diagnosis not present

## 2016-05-14 DIAGNOSIS — E11621 Type 2 diabetes mellitus with foot ulcer: Secondary | ICD-10-CM | POA: Diagnosis not present

## 2016-05-14 DIAGNOSIS — E1151 Type 2 diabetes mellitus with diabetic peripheral angiopathy without gangrene: Secondary | ICD-10-CM | POA: Diagnosis not present

## 2016-05-15 DIAGNOSIS — Z4781 Encounter for orthopedic aftercare following surgical amputation: Secondary | ICD-10-CM | POA: Diagnosis not present

## 2016-05-15 DIAGNOSIS — E1122 Type 2 diabetes mellitus with diabetic chronic kidney disease: Secondary | ICD-10-CM | POA: Diagnosis not present

## 2016-05-15 DIAGNOSIS — N189 Chronic kidney disease, unspecified: Secondary | ICD-10-CM | POA: Diagnosis not present

## 2016-05-15 DIAGNOSIS — D649 Anemia, unspecified: Secondary | ICD-10-CM | POA: Diagnosis not present

## 2016-05-15 DIAGNOSIS — E1151 Type 2 diabetes mellitus with diabetic peripheral angiopathy without gangrene: Secondary | ICD-10-CM | POA: Diagnosis not present

## 2016-05-15 DIAGNOSIS — I129 Hypertensive chronic kidney disease with stage 1 through stage 4 chronic kidney disease, or unspecified chronic kidney disease: Secondary | ICD-10-CM | POA: Diagnosis not present

## 2016-05-17 DIAGNOSIS — E1122 Type 2 diabetes mellitus with diabetic chronic kidney disease: Secondary | ICD-10-CM | POA: Diagnosis not present

## 2016-05-17 DIAGNOSIS — D649 Anemia, unspecified: Secondary | ICD-10-CM | POA: Diagnosis not present

## 2016-05-17 DIAGNOSIS — N189 Chronic kidney disease, unspecified: Secondary | ICD-10-CM | POA: Diagnosis not present

## 2016-05-17 DIAGNOSIS — Z4781 Encounter for orthopedic aftercare following surgical amputation: Secondary | ICD-10-CM | POA: Diagnosis not present

## 2016-05-17 DIAGNOSIS — I129 Hypertensive chronic kidney disease with stage 1 through stage 4 chronic kidney disease, or unspecified chronic kidney disease: Secondary | ICD-10-CM | POA: Diagnosis not present

## 2016-05-17 DIAGNOSIS — E1151 Type 2 diabetes mellitus with diabetic peripheral angiopathy without gangrene: Secondary | ICD-10-CM | POA: Diagnosis not present

## 2016-05-20 DIAGNOSIS — E1142 Type 2 diabetes mellitus with diabetic polyneuropathy: Secondary | ICD-10-CM | POA: Diagnosis not present

## 2016-05-20 DIAGNOSIS — E1151 Type 2 diabetes mellitus with diabetic peripheral angiopathy without gangrene: Secondary | ICD-10-CM | POA: Diagnosis not present

## 2016-05-20 DIAGNOSIS — E11621 Type 2 diabetes mellitus with foot ulcer: Secondary | ICD-10-CM | POA: Diagnosis not present

## 2016-05-20 DIAGNOSIS — L97512 Non-pressure chronic ulcer of other part of right foot with fat layer exposed: Secondary | ICD-10-CM | POA: Diagnosis not present

## 2016-05-22 DIAGNOSIS — N189 Chronic kidney disease, unspecified: Secondary | ICD-10-CM | POA: Diagnosis not present

## 2016-05-22 DIAGNOSIS — Z4781 Encounter for orthopedic aftercare following surgical amputation: Secondary | ICD-10-CM | POA: Diagnosis not present

## 2016-05-22 DIAGNOSIS — I129 Hypertensive chronic kidney disease with stage 1 through stage 4 chronic kidney disease, or unspecified chronic kidney disease: Secondary | ICD-10-CM | POA: Diagnosis not present

## 2016-05-22 DIAGNOSIS — E1122 Type 2 diabetes mellitus with diabetic chronic kidney disease: Secondary | ICD-10-CM | POA: Diagnosis not present

## 2016-05-22 DIAGNOSIS — E1151 Type 2 diabetes mellitus with diabetic peripheral angiopathy without gangrene: Secondary | ICD-10-CM | POA: Diagnosis not present

## 2016-05-22 DIAGNOSIS — D649 Anemia, unspecified: Secondary | ICD-10-CM | POA: Diagnosis not present

## 2016-05-24 DIAGNOSIS — N189 Chronic kidney disease, unspecified: Secondary | ICD-10-CM | POA: Diagnosis not present

## 2016-05-24 DIAGNOSIS — D649 Anemia, unspecified: Secondary | ICD-10-CM | POA: Diagnosis not present

## 2016-05-24 DIAGNOSIS — E1151 Type 2 diabetes mellitus with diabetic peripheral angiopathy without gangrene: Secondary | ICD-10-CM | POA: Diagnosis not present

## 2016-05-24 DIAGNOSIS — Z4781 Encounter for orthopedic aftercare following surgical amputation: Secondary | ICD-10-CM | POA: Diagnosis not present

## 2016-05-24 DIAGNOSIS — E1122 Type 2 diabetes mellitus with diabetic chronic kidney disease: Secondary | ICD-10-CM | POA: Diagnosis not present

## 2016-05-24 DIAGNOSIS — I129 Hypertensive chronic kidney disease with stage 1 through stage 4 chronic kidney disease, or unspecified chronic kidney disease: Secondary | ICD-10-CM | POA: Diagnosis not present

## 2016-05-27 DIAGNOSIS — I70235 Atherosclerosis of native arteries of right leg with ulceration of other part of foot: Secondary | ICD-10-CM | POA: Diagnosis not present

## 2016-05-27 DIAGNOSIS — L97512 Non-pressure chronic ulcer of other part of right foot with fat layer exposed: Secondary | ICD-10-CM | POA: Diagnosis not present

## 2016-05-27 DIAGNOSIS — E11621 Type 2 diabetes mellitus with foot ulcer: Secondary | ICD-10-CM | POA: Diagnosis not present

## 2016-05-27 DIAGNOSIS — E1151 Type 2 diabetes mellitus with diabetic peripheral angiopathy without gangrene: Secondary | ICD-10-CM | POA: Diagnosis not present

## 2016-05-27 DIAGNOSIS — E1142 Type 2 diabetes mellitus with diabetic polyneuropathy: Secondary | ICD-10-CM | POA: Diagnosis not present

## 2016-06-03 DIAGNOSIS — E1151 Type 2 diabetes mellitus with diabetic peripheral angiopathy without gangrene: Secondary | ICD-10-CM | POA: Diagnosis not present

## 2016-06-03 DIAGNOSIS — L97512 Non-pressure chronic ulcer of other part of right foot with fat layer exposed: Secondary | ICD-10-CM | POA: Diagnosis not present

## 2016-06-03 DIAGNOSIS — E11621 Type 2 diabetes mellitus with foot ulcer: Secondary | ICD-10-CM | POA: Diagnosis not present

## 2016-06-11 DIAGNOSIS — L97519 Non-pressure chronic ulcer of other part of right foot with unspecified severity: Secondary | ICD-10-CM | POA: Diagnosis not present

## 2016-06-11 DIAGNOSIS — E1151 Type 2 diabetes mellitus with diabetic peripheral angiopathy without gangrene: Secondary | ICD-10-CM | POA: Diagnosis not present

## 2016-06-17 DIAGNOSIS — E1142 Type 2 diabetes mellitus with diabetic polyneuropathy: Secondary | ICD-10-CM | POA: Diagnosis not present

## 2016-06-17 DIAGNOSIS — E1151 Type 2 diabetes mellitus with diabetic peripheral angiopathy without gangrene: Secondary | ICD-10-CM | POA: Diagnosis not present

## 2016-06-17 DIAGNOSIS — E11621 Type 2 diabetes mellitus with foot ulcer: Secondary | ICD-10-CM | POA: Diagnosis not present

## 2016-06-17 DIAGNOSIS — L97512 Non-pressure chronic ulcer of other part of right foot with fat layer exposed: Secondary | ICD-10-CM | POA: Diagnosis not present

## 2016-06-19 DIAGNOSIS — G459 Transient cerebral ischemic attack, unspecified: Secondary | ICD-10-CM | POA: Diagnosis not present

## 2016-06-19 DIAGNOSIS — F0390 Unspecified dementia without behavioral disturbance: Secondary | ICD-10-CM | POA: Diagnosis not present

## 2016-06-19 DIAGNOSIS — G1221 Amyotrophic lateral sclerosis: Secondary | ICD-10-CM | POA: Diagnosis not present

## 2016-06-24 DIAGNOSIS — E1142 Type 2 diabetes mellitus with diabetic polyneuropathy: Secondary | ICD-10-CM | POA: Diagnosis not present

## 2016-06-24 DIAGNOSIS — E11621 Type 2 diabetes mellitus with foot ulcer: Secondary | ICD-10-CM | POA: Diagnosis not present

## 2016-06-24 DIAGNOSIS — E1151 Type 2 diabetes mellitus with diabetic peripheral angiopathy without gangrene: Secondary | ICD-10-CM | POA: Diagnosis not present

## 2016-06-24 DIAGNOSIS — L97512 Non-pressure chronic ulcer of other part of right foot with fat layer exposed: Secondary | ICD-10-CM | POA: Diagnosis not present

## 2016-07-02 DIAGNOSIS — L97519 Non-pressure chronic ulcer of other part of right foot with unspecified severity: Secondary | ICD-10-CM | POA: Diagnosis not present

## 2016-07-22 DIAGNOSIS — E11621 Type 2 diabetes mellitus with foot ulcer: Secondary | ICD-10-CM | POA: Diagnosis not present

## 2016-07-22 DIAGNOSIS — L97512 Non-pressure chronic ulcer of other part of right foot with fat layer exposed: Secondary | ICD-10-CM | POA: Diagnosis not present

## 2016-07-22 DIAGNOSIS — E1151 Type 2 diabetes mellitus with diabetic peripheral angiopathy without gangrene: Secondary | ICD-10-CM | POA: Diagnosis not present

## 2016-07-22 DIAGNOSIS — E1142 Type 2 diabetes mellitus with diabetic polyneuropathy: Secondary | ICD-10-CM | POA: Diagnosis not present

## 2016-07-29 DIAGNOSIS — E1142 Type 2 diabetes mellitus with diabetic polyneuropathy: Secondary | ICD-10-CM | POA: Diagnosis not present

## 2016-07-29 DIAGNOSIS — F0390 Unspecified dementia without behavioral disturbance: Secondary | ICD-10-CM | POA: Diagnosis not present

## 2016-07-29 DIAGNOSIS — I70211 Atherosclerosis of native arteries of extremities with intermittent claudication, right leg: Secondary | ICD-10-CM | POA: Diagnosis not present

## 2016-07-29 DIAGNOSIS — M86671 Other chronic osteomyelitis, right ankle and foot: Secondary | ICD-10-CM | POA: Diagnosis not present

## 2016-07-29 DIAGNOSIS — L97512 Non-pressure chronic ulcer of other part of right foot with fat layer exposed: Secondary | ICD-10-CM | POA: Diagnosis not present

## 2016-07-29 DIAGNOSIS — E11621 Type 2 diabetes mellitus with foot ulcer: Secondary | ICD-10-CM | POA: Diagnosis not present

## 2016-07-29 DIAGNOSIS — E1151 Type 2 diabetes mellitus with diabetic peripheral angiopathy without gangrene: Secondary | ICD-10-CM | POA: Diagnosis not present

## 2016-07-29 DIAGNOSIS — R296 Repeated falls: Secondary | ICD-10-CM | POA: Diagnosis not present

## 2016-08-05 DIAGNOSIS — E11621 Type 2 diabetes mellitus with foot ulcer: Secondary | ICD-10-CM | POA: Diagnosis not present

## 2016-08-05 DIAGNOSIS — E1151 Type 2 diabetes mellitus with diabetic peripheral angiopathy without gangrene: Secondary | ICD-10-CM | POA: Diagnosis not present

## 2016-08-05 DIAGNOSIS — E1142 Type 2 diabetes mellitus with diabetic polyneuropathy: Secondary | ICD-10-CM | POA: Diagnosis not present

## 2016-08-05 DIAGNOSIS — L97512 Non-pressure chronic ulcer of other part of right foot with fat layer exposed: Secondary | ICD-10-CM | POA: Diagnosis not present

## 2016-08-13 DIAGNOSIS — S91301A Unspecified open wound, right foot, initial encounter: Secondary | ICD-10-CM | POA: Diagnosis not present

## 2016-08-13 DIAGNOSIS — E11621 Type 2 diabetes mellitus with foot ulcer: Secondary | ICD-10-CM | POA: Diagnosis not present

## 2016-08-13 DIAGNOSIS — E1151 Type 2 diabetes mellitus with diabetic peripheral angiopathy without gangrene: Secondary | ICD-10-CM | POA: Diagnosis not present

## 2016-08-13 DIAGNOSIS — L97519 Non-pressure chronic ulcer of other part of right foot with unspecified severity: Secondary | ICD-10-CM | POA: Diagnosis not present

## 2016-08-13 DIAGNOSIS — I739 Peripheral vascular disease, unspecified: Secondary | ICD-10-CM | POA: Diagnosis not present

## 2016-08-15 DIAGNOSIS — E782 Mixed hyperlipidemia: Secondary | ICD-10-CM | POA: Diagnosis not present

## 2016-08-15 DIAGNOSIS — I10 Essential (primary) hypertension: Secondary | ICD-10-CM | POA: Diagnosis not present

## 2016-08-15 DIAGNOSIS — I739 Peripheral vascular disease, unspecified: Secondary | ICD-10-CM | POA: Diagnosis not present

## 2016-08-15 DIAGNOSIS — R55 Syncope and collapse: Secondary | ICD-10-CM | POA: Diagnosis not present

## 2016-08-15 DIAGNOSIS — I2510 Atherosclerotic heart disease of native coronary artery without angina pectoris: Secondary | ICD-10-CM | POA: Diagnosis not present

## 2016-08-15 DIAGNOSIS — R001 Bradycardia, unspecified: Secondary | ICD-10-CM | POA: Diagnosis not present

## 2016-08-19 DIAGNOSIS — E11621 Type 2 diabetes mellitus with foot ulcer: Secondary | ICD-10-CM | POA: Diagnosis not present

## 2016-08-19 DIAGNOSIS — L97519 Non-pressure chronic ulcer of other part of right foot with unspecified severity: Secondary | ICD-10-CM | POA: Diagnosis not present

## 2016-08-19 DIAGNOSIS — E1142 Type 2 diabetes mellitus with diabetic polyneuropathy: Secondary | ICD-10-CM | POA: Diagnosis not present

## 2016-08-19 DIAGNOSIS — L97512 Non-pressure chronic ulcer of other part of right foot with fat layer exposed: Secondary | ICD-10-CM | POA: Diagnosis not present

## 2016-08-19 DIAGNOSIS — E1151 Type 2 diabetes mellitus with diabetic peripheral angiopathy without gangrene: Secondary | ICD-10-CM | POA: Diagnosis not present

## 2016-08-20 DIAGNOSIS — I10 Essential (primary) hypertension: Secondary | ICD-10-CM | POA: Diagnosis not present

## 2016-08-20 DIAGNOSIS — S91309A Unspecified open wound, unspecified foot, initial encounter: Secondary | ICD-10-CM | POA: Diagnosis not present

## 2016-08-20 DIAGNOSIS — L97519 Non-pressure chronic ulcer of other part of right foot with unspecified severity: Secondary | ICD-10-CM | POA: Diagnosis not present

## 2016-08-20 DIAGNOSIS — E11621 Type 2 diabetes mellitus with foot ulcer: Secondary | ICD-10-CM | POA: Diagnosis not present

## 2016-08-20 DIAGNOSIS — E1151 Type 2 diabetes mellitus with diabetic peripheral angiopathy without gangrene: Secondary | ICD-10-CM | POA: Diagnosis not present

## 2016-08-20 DIAGNOSIS — N183 Chronic kidney disease, stage 3 (moderate): Secondary | ICD-10-CM | POA: Diagnosis not present

## 2016-08-27 DIAGNOSIS — E11621 Type 2 diabetes mellitus with foot ulcer: Secondary | ICD-10-CM | POA: Diagnosis not present

## 2016-08-27 DIAGNOSIS — E1151 Type 2 diabetes mellitus with diabetic peripheral angiopathy without gangrene: Secondary | ICD-10-CM | POA: Diagnosis not present

## 2016-08-27 DIAGNOSIS — L97519 Non-pressure chronic ulcer of other part of right foot with unspecified severity: Secondary | ICD-10-CM | POA: Diagnosis not present

## 2016-09-02 DIAGNOSIS — E11621 Type 2 diabetes mellitus with foot ulcer: Secondary | ICD-10-CM | POA: Diagnosis not present

## 2016-09-02 DIAGNOSIS — E1151 Type 2 diabetes mellitus with diabetic peripheral angiopathy without gangrene: Secondary | ICD-10-CM | POA: Diagnosis not present

## 2016-09-02 DIAGNOSIS — E1142 Type 2 diabetes mellitus with diabetic polyneuropathy: Secondary | ICD-10-CM | POA: Diagnosis not present

## 2016-09-02 DIAGNOSIS — L97512 Non-pressure chronic ulcer of other part of right foot with fat layer exposed: Secondary | ICD-10-CM | POA: Diagnosis not present

## 2016-09-10 DIAGNOSIS — E1151 Type 2 diabetes mellitus with diabetic peripheral angiopathy without gangrene: Secondary | ICD-10-CM | POA: Diagnosis not present

## 2016-09-10 DIAGNOSIS — S91309A Unspecified open wound, unspecified foot, initial encounter: Secondary | ICD-10-CM | POA: Diagnosis not present

## 2016-09-10 DIAGNOSIS — I739 Peripheral vascular disease, unspecified: Secondary | ICD-10-CM | POA: Diagnosis not present

## 2016-09-10 DIAGNOSIS — L97519 Non-pressure chronic ulcer of other part of right foot with unspecified severity: Secondary | ICD-10-CM | POA: Diagnosis not present

## 2016-09-10 DIAGNOSIS — E11621 Type 2 diabetes mellitus with foot ulcer: Secondary | ICD-10-CM | POA: Diagnosis not present

## 2016-09-10 DIAGNOSIS — E1142 Type 2 diabetes mellitus with diabetic polyneuropathy: Secondary | ICD-10-CM | POA: Diagnosis not present

## 2016-09-16 DIAGNOSIS — E11621 Type 2 diabetes mellitus with foot ulcer: Secondary | ICD-10-CM | POA: Diagnosis not present

## 2016-09-16 DIAGNOSIS — L97519 Non-pressure chronic ulcer of other part of right foot with unspecified severity: Secondary | ICD-10-CM | POA: Diagnosis not present

## 2016-09-16 DIAGNOSIS — L97512 Non-pressure chronic ulcer of other part of right foot with fat layer exposed: Secondary | ICD-10-CM | POA: Diagnosis not present

## 2016-09-16 DIAGNOSIS — E1151 Type 2 diabetes mellitus with diabetic peripheral angiopathy without gangrene: Secondary | ICD-10-CM | POA: Diagnosis not present

## 2016-09-23 DIAGNOSIS — E11621 Type 2 diabetes mellitus with foot ulcer: Secondary | ICD-10-CM | POA: Diagnosis not present

## 2016-09-23 DIAGNOSIS — E1151 Type 2 diabetes mellitus with diabetic peripheral angiopathy without gangrene: Secondary | ICD-10-CM | POA: Diagnosis not present

## 2016-09-23 DIAGNOSIS — L97519 Non-pressure chronic ulcer of other part of right foot with unspecified severity: Secondary | ICD-10-CM | POA: Diagnosis not present

## 2016-09-23 DIAGNOSIS — L97512 Non-pressure chronic ulcer of other part of right foot with fat layer exposed: Secondary | ICD-10-CM | POA: Diagnosis not present

## 2016-09-24 DIAGNOSIS — M5137 Other intervertebral disc degeneration, lumbosacral region: Secondary | ICD-10-CM | POA: Diagnosis not present

## 2016-09-24 DIAGNOSIS — M545 Low back pain: Secondary | ICD-10-CM | POA: Diagnosis not present

## 2016-09-24 DIAGNOSIS — M5136 Other intervertebral disc degeneration, lumbar region: Secondary | ICD-10-CM | POA: Diagnosis not present

## 2016-09-24 DIAGNOSIS — R296 Repeated falls: Secondary | ICD-10-CM | POA: Diagnosis not present

## 2016-09-24 DIAGNOSIS — M160 Bilateral primary osteoarthritis of hip: Secondary | ICD-10-CM | POA: Diagnosis not present

## 2016-09-24 DIAGNOSIS — M47817 Spondylosis without myelopathy or radiculopathy, lumbosacral region: Secondary | ICD-10-CM | POA: Diagnosis not present

## 2016-10-01 DIAGNOSIS — L97519 Non-pressure chronic ulcer of other part of right foot with unspecified severity: Secondary | ICD-10-CM | POA: Diagnosis not present

## 2016-10-01 DIAGNOSIS — L97512 Non-pressure chronic ulcer of other part of right foot with fat layer exposed: Secondary | ICD-10-CM | POA: Diagnosis not present

## 2016-10-01 DIAGNOSIS — E1151 Type 2 diabetes mellitus with diabetic peripheral angiopathy without gangrene: Secondary | ICD-10-CM | POA: Diagnosis not present

## 2016-10-01 DIAGNOSIS — E11621 Type 2 diabetes mellitus with foot ulcer: Secondary | ICD-10-CM | POA: Diagnosis not present

## 2016-10-01 DIAGNOSIS — I739 Peripheral vascular disease, unspecified: Secondary | ICD-10-CM | POA: Diagnosis not present

## 2016-10-01 DIAGNOSIS — S91309A Unspecified open wound, unspecified foot, initial encounter: Secondary | ICD-10-CM | POA: Diagnosis not present

## 2016-10-02 DIAGNOSIS — M79671 Pain in right foot: Secondary | ICD-10-CM | POA: Diagnosis not present

## 2016-10-02 DIAGNOSIS — I70261 Atherosclerosis of native arteries of extremities with gangrene, right leg: Secondary | ICD-10-CM | POA: Diagnosis not present

## 2016-10-07 DIAGNOSIS — E11621 Type 2 diabetes mellitus with foot ulcer: Secondary | ICD-10-CM | POA: Diagnosis not present

## 2016-10-07 DIAGNOSIS — E1142 Type 2 diabetes mellitus with diabetic polyneuropathy: Secondary | ICD-10-CM | POA: Diagnosis not present

## 2016-10-07 DIAGNOSIS — L97512 Non-pressure chronic ulcer of other part of right foot with fat layer exposed: Secondary | ICD-10-CM | POA: Diagnosis not present

## 2016-10-07 DIAGNOSIS — E1151 Type 2 diabetes mellitus with diabetic peripheral angiopathy without gangrene: Secondary | ICD-10-CM | POA: Diagnosis not present

## 2016-10-08 DIAGNOSIS — G1229 Other motor neuron disease: Secondary | ICD-10-CM | POA: Diagnosis not present

## 2016-10-08 DIAGNOSIS — F320 Major depressive disorder, single episode, mild: Secondary | ICD-10-CM | POA: Diagnosis not present

## 2016-10-08 DIAGNOSIS — I10 Essential (primary) hypertension: Secondary | ICD-10-CM | POA: Diagnosis not present

## 2016-10-08 DIAGNOSIS — E1140 Type 2 diabetes mellitus with diabetic neuropathy, unspecified: Secondary | ICD-10-CM | POA: Diagnosis not present

## 2016-10-08 DIAGNOSIS — C61 Malignant neoplasm of prostate: Secondary | ICD-10-CM | POA: Diagnosis not present

## 2016-10-08 DIAGNOSIS — S91301D Unspecified open wound, right foot, subsequent encounter: Secondary | ICD-10-CM | POA: Diagnosis not present

## 2016-10-08 DIAGNOSIS — Z125 Encounter for screening for malignant neoplasm of prostate: Secondary | ICD-10-CM | POA: Diagnosis not present

## 2016-10-08 DIAGNOSIS — Z0000 Encounter for general adult medical examination without abnormal findings: Secondary | ICD-10-CM | POA: Diagnosis not present

## 2016-10-08 DIAGNOSIS — Z01818 Encounter for other preprocedural examination: Secondary | ICD-10-CM | POA: Diagnosis not present

## 2016-10-08 DIAGNOSIS — I70261 Atherosclerosis of native arteries of extremities with gangrene, right leg: Secondary | ICD-10-CM | POA: Diagnosis not present

## 2016-10-08 DIAGNOSIS — E1121 Type 2 diabetes mellitus with diabetic nephropathy: Secondary | ICD-10-CM | POA: Diagnosis not present

## 2016-10-08 DIAGNOSIS — I739 Peripheral vascular disease, unspecified: Secondary | ICD-10-CM | POA: Diagnosis not present

## 2016-10-15 DIAGNOSIS — I70201 Unspecified atherosclerosis of native arteries of extremities, right leg: Secondary | ICD-10-CM | POA: Diagnosis not present

## 2016-10-15 DIAGNOSIS — I70261 Atherosclerosis of native arteries of extremities with gangrene, right leg: Secondary | ICD-10-CM | POA: Diagnosis not present

## 2016-10-21 DIAGNOSIS — L97512 Non-pressure chronic ulcer of other part of right foot with fat layer exposed: Secondary | ICD-10-CM | POA: Diagnosis not present

## 2016-10-21 DIAGNOSIS — E1142 Type 2 diabetes mellitus with diabetic polyneuropathy: Secondary | ICD-10-CM | POA: Diagnosis not present

## 2016-10-21 DIAGNOSIS — E11621 Type 2 diabetes mellitus with foot ulcer: Secondary | ICD-10-CM | POA: Diagnosis not present

## 2016-10-21 DIAGNOSIS — E1151 Type 2 diabetes mellitus with diabetic peripheral angiopathy without gangrene: Secondary | ICD-10-CM | POA: Diagnosis not present

## 2016-10-23 DIAGNOSIS — R972 Elevated prostate specific antigen [PSA]: Secondary | ICD-10-CM | POA: Diagnosis not present

## 2016-10-23 DIAGNOSIS — Z8546 Personal history of malignant neoplasm of prostate: Secondary | ICD-10-CM | POA: Diagnosis not present

## 2016-10-23 DIAGNOSIS — E1121 Type 2 diabetes mellitus with diabetic nephropathy: Secondary | ICD-10-CM | POA: Diagnosis not present

## 2016-10-23 DIAGNOSIS — N183 Chronic kidney disease, stage 3 (moderate): Secondary | ICD-10-CM | POA: Diagnosis not present

## 2016-10-23 DIAGNOSIS — M545 Low back pain: Secondary | ICD-10-CM | POA: Diagnosis not present

## 2016-10-28 DIAGNOSIS — N183 Chronic kidney disease, stage 3 (moderate): Secondary | ICD-10-CM | POA: Diagnosis not present

## 2016-10-29 DIAGNOSIS — I7025 Atherosclerosis of native arteries of other extremities with ulceration: Secondary | ICD-10-CM | POA: Diagnosis not present

## 2016-10-29 DIAGNOSIS — Z8631 Personal history of diabetic foot ulcer: Secondary | ICD-10-CM | POA: Diagnosis not present

## 2016-10-29 DIAGNOSIS — I70202 Unspecified atherosclerosis of native arteries of extremities, left leg: Secondary | ICD-10-CM | POA: Diagnosis not present

## 2016-10-29 DIAGNOSIS — I10 Essential (primary) hypertension: Secondary | ICD-10-CM | POA: Diagnosis not present

## 2016-10-29 DIAGNOSIS — Z89421 Acquired absence of other right toe(s): Secondary | ICD-10-CM | POA: Diagnosis not present

## 2016-10-29 DIAGNOSIS — Z9862 Peripheral vascular angioplasty status: Secondary | ICD-10-CM | POA: Diagnosis not present

## 2016-10-29 DIAGNOSIS — Z880 Allergy status to penicillin: Secondary | ICD-10-CM | POA: Diagnosis not present

## 2016-10-29 DIAGNOSIS — E785 Hyperlipidemia, unspecified: Secondary | ICD-10-CM | POA: Diagnosis not present

## 2016-10-29 DIAGNOSIS — E1151 Type 2 diabetes mellitus with diabetic peripheral angiopathy without gangrene: Secondary | ICD-10-CM | POA: Diagnosis not present

## 2016-10-29 DIAGNOSIS — T8789 Other complications of amputation stump: Secondary | ICD-10-CM | POA: Diagnosis not present

## 2016-10-29 DIAGNOSIS — E1142 Type 2 diabetes mellitus with diabetic polyneuropathy: Secondary | ICD-10-CM | POA: Diagnosis not present

## 2016-10-29 DIAGNOSIS — E11621 Type 2 diabetes mellitus with foot ulcer: Secondary | ICD-10-CM | POA: Diagnosis not present

## 2016-10-29 DIAGNOSIS — M109 Gout, unspecified: Secondary | ICD-10-CM | POA: Diagnosis not present

## 2016-10-29 DIAGNOSIS — Z794 Long term (current) use of insulin: Secondary | ICD-10-CM | POA: Diagnosis not present

## 2016-10-31 DIAGNOSIS — I70269 Atherosclerosis of native arteries of extremities with gangrene, unspecified extremity: Secondary | ICD-10-CM | POA: Diagnosis not present

## 2016-10-31 DIAGNOSIS — E11621 Type 2 diabetes mellitus with foot ulcer: Secondary | ICD-10-CM | POA: Diagnosis not present

## 2016-10-31 DIAGNOSIS — E1151 Type 2 diabetes mellitus with diabetic peripheral angiopathy without gangrene: Secondary | ICD-10-CM | POA: Diagnosis not present

## 2016-10-31 DIAGNOSIS — E1142 Type 2 diabetes mellitus with diabetic polyneuropathy: Secondary | ICD-10-CM | POA: Diagnosis not present

## 2016-10-31 DIAGNOSIS — L97512 Non-pressure chronic ulcer of other part of right foot with fat layer exposed: Secondary | ICD-10-CM | POA: Diagnosis not present

## 2016-11-01 DIAGNOSIS — L97512 Non-pressure chronic ulcer of other part of right foot with fat layer exposed: Secondary | ICD-10-CM | POA: Diagnosis not present

## 2016-11-01 DIAGNOSIS — E11621 Type 2 diabetes mellitus with foot ulcer: Secondary | ICD-10-CM | POA: Diagnosis not present

## 2016-11-01 DIAGNOSIS — E1151 Type 2 diabetes mellitus with diabetic peripheral angiopathy without gangrene: Secondary | ICD-10-CM | POA: Diagnosis not present

## 2016-11-01 DIAGNOSIS — E1142 Type 2 diabetes mellitus with diabetic polyneuropathy: Secondary | ICD-10-CM | POA: Diagnosis not present

## 2016-11-04 DIAGNOSIS — E1151 Type 2 diabetes mellitus with diabetic peripheral angiopathy without gangrene: Secondary | ICD-10-CM | POA: Diagnosis not present

## 2016-11-04 DIAGNOSIS — E1142 Type 2 diabetes mellitus with diabetic polyneuropathy: Secondary | ICD-10-CM | POA: Diagnosis not present

## 2016-11-04 DIAGNOSIS — E11621 Type 2 diabetes mellitus with foot ulcer: Secondary | ICD-10-CM | POA: Diagnosis not present

## 2016-11-04 DIAGNOSIS — L97512 Non-pressure chronic ulcer of other part of right foot with fat layer exposed: Secondary | ICD-10-CM | POA: Diagnosis not present

## 2016-11-04 DIAGNOSIS — I739 Peripheral vascular disease, unspecified: Secondary | ICD-10-CM

## 2016-11-04 DIAGNOSIS — I70269 Atherosclerosis of native arteries of extremities with gangrene, unspecified extremity: Secondary | ICD-10-CM | POA: Insufficient documentation

## 2016-11-04 DIAGNOSIS — I70229 Atherosclerosis of native arteries of extremities with rest pain, unspecified extremity: Secondary | ICD-10-CM | POA: Insufficient documentation

## 2016-11-05 DIAGNOSIS — Z89421 Acquired absence of other right toe(s): Secondary | ICD-10-CM | POA: Diagnosis not present

## 2016-11-05 DIAGNOSIS — I70213 Atherosclerosis of native arteries of extremities with intermittent claudication, bilateral legs: Secondary | ICD-10-CM | POA: Diagnosis not present

## 2016-11-05 DIAGNOSIS — Z48812 Encounter for surgical aftercare following surgery on the circulatory system: Secondary | ICD-10-CM | POA: Diagnosis not present

## 2016-11-05 DIAGNOSIS — I70211 Atherosclerosis of native arteries of extremities with intermittent claudication, right leg: Secondary | ICD-10-CM | POA: Diagnosis not present

## 2016-11-05 DIAGNOSIS — Z95820 Peripheral vascular angioplasty status with implants and grafts: Secondary | ICD-10-CM | POA: Diagnosis not present

## 2016-11-06 DIAGNOSIS — N200 Calculus of kidney: Secondary | ICD-10-CM | POA: Diagnosis not present

## 2016-11-06 DIAGNOSIS — M545 Low back pain: Secondary | ICD-10-CM | POA: Diagnosis not present

## 2016-11-06 DIAGNOSIS — R972 Elevated prostate specific antigen [PSA]: Secondary | ICD-10-CM | POA: Diagnosis not present

## 2016-11-06 DIAGNOSIS — R3915 Urgency of urination: Secondary | ICD-10-CM | POA: Diagnosis not present

## 2016-11-06 DIAGNOSIS — N5235 Erectile dysfunction following radiation therapy: Secondary | ICD-10-CM | POA: Diagnosis not present

## 2016-11-06 DIAGNOSIS — N182 Chronic kidney disease, stage 2 (mild): Secondary | ICD-10-CM | POA: Diagnosis not present

## 2016-11-06 DIAGNOSIS — C61 Malignant neoplasm of prostate: Secondary | ICD-10-CM | POA: Diagnosis not present

## 2016-11-06 DIAGNOSIS — N3941 Urge incontinence: Secondary | ICD-10-CM | POA: Diagnosis not present

## 2016-11-12 DIAGNOSIS — Z89421 Acquired absence of other right toe(s): Secondary | ICD-10-CM | POA: Diagnosis not present

## 2016-11-12 DIAGNOSIS — Z95820 Peripheral vascular angioplasty status with implants and grafts: Secondary | ICD-10-CM | POA: Diagnosis not present

## 2016-11-12 DIAGNOSIS — Z794 Long term (current) use of insulin: Secondary | ICD-10-CM | POA: Diagnosis not present

## 2016-11-12 DIAGNOSIS — L97412 Non-pressure chronic ulcer of right heel and midfoot with fat layer exposed: Secondary | ICD-10-CM | POA: Diagnosis not present

## 2016-11-12 DIAGNOSIS — E1142 Type 2 diabetes mellitus with diabetic polyneuropathy: Secondary | ICD-10-CM | POA: Diagnosis not present

## 2016-11-12 DIAGNOSIS — E785 Hyperlipidemia, unspecified: Secondary | ICD-10-CM | POA: Diagnosis not present

## 2016-11-12 DIAGNOSIS — T8789 Other complications of amputation stump: Secondary | ICD-10-CM | POA: Diagnosis not present

## 2016-11-12 DIAGNOSIS — I10 Essential (primary) hypertension: Secondary | ICD-10-CM | POA: Diagnosis not present

## 2016-11-12 DIAGNOSIS — E11621 Type 2 diabetes mellitus with foot ulcer: Secondary | ICD-10-CM | POA: Diagnosis not present

## 2016-11-12 DIAGNOSIS — I70234 Atherosclerosis of native arteries of right leg with ulceration of heel and midfoot: Secondary | ICD-10-CM | POA: Diagnosis not present

## 2016-11-12 DIAGNOSIS — E1151 Type 2 diabetes mellitus with diabetic peripheral angiopathy without gangrene: Secondary | ICD-10-CM | POA: Diagnosis not present

## 2016-11-12 DIAGNOSIS — Z880 Allergy status to penicillin: Secondary | ICD-10-CM | POA: Diagnosis not present

## 2016-11-12 DIAGNOSIS — M109 Gout, unspecified: Secondary | ICD-10-CM | POA: Diagnosis not present

## 2016-11-19 DIAGNOSIS — M109 Gout, unspecified: Secondary | ICD-10-CM | POA: Diagnosis not present

## 2016-11-19 DIAGNOSIS — E1151 Type 2 diabetes mellitus with diabetic peripheral angiopathy without gangrene: Secondary | ICD-10-CM | POA: Diagnosis not present

## 2016-11-19 DIAGNOSIS — L97412 Non-pressure chronic ulcer of right heel and midfoot with fat layer exposed: Secondary | ICD-10-CM | POA: Diagnosis not present

## 2016-11-19 DIAGNOSIS — E11621 Type 2 diabetes mellitus with foot ulcer: Secondary | ICD-10-CM | POA: Diagnosis not present

## 2016-11-19 DIAGNOSIS — Z95820 Peripheral vascular angioplasty status with implants and grafts: Secondary | ICD-10-CM | POA: Diagnosis not present

## 2016-11-19 DIAGNOSIS — T8789 Other complications of amputation stump: Secondary | ICD-10-CM | POA: Diagnosis not present

## 2016-11-19 DIAGNOSIS — Z880 Allergy status to penicillin: Secondary | ICD-10-CM | POA: Diagnosis not present

## 2016-11-19 DIAGNOSIS — Z89421 Acquired absence of other right toe(s): Secondary | ICD-10-CM | POA: Diagnosis not present

## 2016-11-19 DIAGNOSIS — I70234 Atherosclerosis of native arteries of right leg with ulceration of heel and midfoot: Secondary | ICD-10-CM | POA: Diagnosis not present

## 2016-11-19 DIAGNOSIS — E785 Hyperlipidemia, unspecified: Secondary | ICD-10-CM | POA: Diagnosis not present

## 2016-11-19 DIAGNOSIS — I10 Essential (primary) hypertension: Secondary | ICD-10-CM | POA: Diagnosis not present

## 2016-11-19 DIAGNOSIS — E1142 Type 2 diabetes mellitus with diabetic polyneuropathy: Secondary | ICD-10-CM | POA: Diagnosis not present

## 2016-11-19 DIAGNOSIS — Z794 Long term (current) use of insulin: Secondary | ICD-10-CM | POA: Diagnosis not present

## 2016-11-20 DIAGNOSIS — Z23 Encounter for immunization: Secondary | ICD-10-CM | POA: Diagnosis not present

## 2016-11-20 DIAGNOSIS — N182 Chronic kidney disease, stage 2 (mild): Secondary | ICD-10-CM | POA: Diagnosis not present

## 2016-11-21 DIAGNOSIS — N182 Chronic kidney disease, stage 2 (mild): Secondary | ICD-10-CM | POA: Diagnosis not present

## 2016-11-25 DIAGNOSIS — C61 Malignant neoplasm of prostate: Secondary | ICD-10-CM | POA: Diagnosis not present

## 2016-11-25 DIAGNOSIS — N281 Cyst of kidney, acquired: Secondary | ICD-10-CM | POA: Diagnosis not present

## 2016-11-26 DIAGNOSIS — Z95820 Peripheral vascular angioplasty status with implants and grafts: Secondary | ICD-10-CM | POA: Diagnosis not present

## 2016-11-26 DIAGNOSIS — T8789 Other complications of amputation stump: Secondary | ICD-10-CM | POA: Diagnosis not present

## 2016-11-26 DIAGNOSIS — M109 Gout, unspecified: Secondary | ICD-10-CM | POA: Diagnosis not present

## 2016-11-26 DIAGNOSIS — E1151 Type 2 diabetes mellitus with diabetic peripheral angiopathy without gangrene: Secondary | ICD-10-CM | POA: Diagnosis not present

## 2016-11-26 DIAGNOSIS — E785 Hyperlipidemia, unspecified: Secondary | ICD-10-CM | POA: Diagnosis not present

## 2016-11-26 DIAGNOSIS — L97412 Non-pressure chronic ulcer of right heel and midfoot with fat layer exposed: Secondary | ICD-10-CM | POA: Diagnosis not present

## 2016-11-26 DIAGNOSIS — I70234 Atherosclerosis of native arteries of right leg with ulceration of heel and midfoot: Secondary | ICD-10-CM | POA: Diagnosis not present

## 2016-11-26 DIAGNOSIS — E11621 Type 2 diabetes mellitus with foot ulcer: Secondary | ICD-10-CM | POA: Diagnosis not present

## 2016-11-26 DIAGNOSIS — Z89421 Acquired absence of other right toe(s): Secondary | ICD-10-CM | POA: Diagnosis not present

## 2016-11-26 DIAGNOSIS — E1142 Type 2 diabetes mellitus with diabetic polyneuropathy: Secondary | ICD-10-CM | POA: Diagnosis not present

## 2016-11-26 DIAGNOSIS — Z880 Allergy status to penicillin: Secondary | ICD-10-CM | POA: Diagnosis not present

## 2016-11-26 DIAGNOSIS — Z794 Long term (current) use of insulin: Secondary | ICD-10-CM | POA: Diagnosis not present

## 2016-11-26 DIAGNOSIS — I10 Essential (primary) hypertension: Secondary | ICD-10-CM | POA: Diagnosis not present

## 2016-11-27 DIAGNOSIS — Z8546 Personal history of malignant neoplasm of prostate: Secondary | ICD-10-CM | POA: Diagnosis not present

## 2016-11-27 DIAGNOSIS — R972 Elevated prostate specific antigen [PSA]: Secondary | ICD-10-CM | POA: Diagnosis not present

## 2016-12-03 DIAGNOSIS — Z880 Allergy status to penicillin: Secondary | ICD-10-CM | POA: Diagnosis not present

## 2016-12-03 DIAGNOSIS — I70234 Atherosclerosis of native arteries of right leg with ulceration of heel and midfoot: Secondary | ICD-10-CM | POA: Diagnosis not present

## 2016-12-03 DIAGNOSIS — Z794 Long term (current) use of insulin: Secondary | ICD-10-CM | POA: Diagnosis not present

## 2016-12-03 DIAGNOSIS — T8789 Other complications of amputation stump: Secondary | ICD-10-CM | POA: Diagnosis not present

## 2016-12-03 DIAGNOSIS — E1142 Type 2 diabetes mellitus with diabetic polyneuropathy: Secondary | ICD-10-CM | POA: Diagnosis not present

## 2016-12-03 DIAGNOSIS — L97412 Non-pressure chronic ulcer of right heel and midfoot with fat layer exposed: Secondary | ICD-10-CM | POA: Diagnosis not present

## 2016-12-03 DIAGNOSIS — E785 Hyperlipidemia, unspecified: Secondary | ICD-10-CM | POA: Diagnosis not present

## 2016-12-03 DIAGNOSIS — E11621 Type 2 diabetes mellitus with foot ulcer: Secondary | ICD-10-CM | POA: Diagnosis not present

## 2016-12-03 DIAGNOSIS — M109 Gout, unspecified: Secondary | ICD-10-CM | POA: Diagnosis not present

## 2016-12-03 DIAGNOSIS — Z89421 Acquired absence of other right toe(s): Secondary | ICD-10-CM | POA: Diagnosis not present

## 2016-12-03 DIAGNOSIS — I10 Essential (primary) hypertension: Secondary | ICD-10-CM | POA: Diagnosis not present

## 2016-12-03 DIAGNOSIS — Z95820 Peripheral vascular angioplasty status with implants and grafts: Secondary | ICD-10-CM | POA: Diagnosis not present

## 2016-12-03 DIAGNOSIS — E1151 Type 2 diabetes mellitus with diabetic peripheral angiopathy without gangrene: Secondary | ICD-10-CM | POA: Diagnosis not present

## 2016-12-06 DIAGNOSIS — M65331 Trigger finger, right middle finger: Secondary | ICD-10-CM | POA: Diagnosis not present

## 2016-12-06 DIAGNOSIS — M25569 Pain in unspecified knee: Secondary | ICD-10-CM | POA: Diagnosis not present

## 2016-12-09 DIAGNOSIS — R972 Elevated prostate specific antigen [PSA]: Secondary | ICD-10-CM | POA: Diagnosis not present

## 2016-12-09 DIAGNOSIS — R799 Abnormal finding of blood chemistry, unspecified: Secondary | ICD-10-CM | POA: Diagnosis not present

## 2016-12-17 DIAGNOSIS — M109 Gout, unspecified: Secondary | ICD-10-CM | POA: Diagnosis not present

## 2016-12-17 DIAGNOSIS — E1151 Type 2 diabetes mellitus with diabetic peripheral angiopathy without gangrene: Secondary | ICD-10-CM | POA: Diagnosis not present

## 2016-12-17 DIAGNOSIS — E785 Hyperlipidemia, unspecified: Secondary | ICD-10-CM | POA: Diagnosis not present

## 2016-12-17 DIAGNOSIS — I70234 Atherosclerosis of native arteries of right leg with ulceration of heel and midfoot: Secondary | ICD-10-CM | POA: Diagnosis not present

## 2016-12-17 DIAGNOSIS — Z89421 Acquired absence of other right toe(s): Secondary | ICD-10-CM | POA: Diagnosis not present

## 2016-12-17 DIAGNOSIS — Z95820 Peripheral vascular angioplasty status with implants and grafts: Secondary | ICD-10-CM | POA: Diagnosis not present

## 2016-12-17 DIAGNOSIS — E1142 Type 2 diabetes mellitus with diabetic polyneuropathy: Secondary | ICD-10-CM | POA: Diagnosis not present

## 2016-12-17 DIAGNOSIS — I10 Essential (primary) hypertension: Secondary | ICD-10-CM | POA: Diagnosis not present

## 2016-12-17 DIAGNOSIS — E11621 Type 2 diabetes mellitus with foot ulcer: Secondary | ICD-10-CM | POA: Diagnosis not present

## 2016-12-17 DIAGNOSIS — Z880 Allergy status to penicillin: Secondary | ICD-10-CM | POA: Diagnosis not present

## 2016-12-17 DIAGNOSIS — Z794 Long term (current) use of insulin: Secondary | ICD-10-CM | POA: Diagnosis not present

## 2016-12-17 DIAGNOSIS — L97412 Non-pressure chronic ulcer of right heel and midfoot with fat layer exposed: Secondary | ICD-10-CM | POA: Diagnosis not present

## 2016-12-17 DIAGNOSIS — T8789 Other complications of amputation stump: Secondary | ICD-10-CM | POA: Diagnosis not present

## 2016-12-18 DIAGNOSIS — G629 Polyneuropathy, unspecified: Secondary | ICD-10-CM | POA: Diagnosis not present

## 2016-12-18 DIAGNOSIS — G459 Transient cerebral ischemic attack, unspecified: Secondary | ICD-10-CM | POA: Diagnosis not present

## 2016-12-18 DIAGNOSIS — F0390 Unspecified dementia without behavioral disturbance: Secondary | ICD-10-CM | POA: Diagnosis not present

## 2016-12-18 DIAGNOSIS — G1221 Amyotrophic lateral sclerosis: Secondary | ICD-10-CM | POA: Diagnosis not present

## 2016-12-19 DIAGNOSIS — C61 Malignant neoplasm of prostate: Secondary | ICD-10-CM | POA: Diagnosis not present

## 2017-01-07 DIAGNOSIS — Z880 Allergy status to penicillin: Secondary | ICD-10-CM | POA: Diagnosis not present

## 2017-01-07 DIAGNOSIS — Z794 Long term (current) use of insulin: Secondary | ICD-10-CM | POA: Diagnosis not present

## 2017-01-07 DIAGNOSIS — L97412 Non-pressure chronic ulcer of right heel and midfoot with fat layer exposed: Secondary | ICD-10-CM | POA: Diagnosis not present

## 2017-01-07 DIAGNOSIS — Z95820 Peripheral vascular angioplasty status with implants and grafts: Secondary | ICD-10-CM | POA: Diagnosis not present

## 2017-01-07 DIAGNOSIS — E1151 Type 2 diabetes mellitus with diabetic peripheral angiopathy without gangrene: Secondary | ICD-10-CM | POA: Diagnosis not present

## 2017-01-07 DIAGNOSIS — T8789 Other complications of amputation stump: Secondary | ICD-10-CM | POA: Diagnosis not present

## 2017-01-07 DIAGNOSIS — E1142 Type 2 diabetes mellitus with diabetic polyneuropathy: Secondary | ICD-10-CM | POA: Diagnosis not present

## 2017-01-07 DIAGNOSIS — E785 Hyperlipidemia, unspecified: Secondary | ICD-10-CM | POA: Diagnosis not present

## 2017-01-07 DIAGNOSIS — Z89421 Acquired absence of other right toe(s): Secondary | ICD-10-CM | POA: Diagnosis not present

## 2017-01-07 DIAGNOSIS — E11621 Type 2 diabetes mellitus with foot ulcer: Secondary | ICD-10-CM | POA: Diagnosis not present

## 2017-01-07 DIAGNOSIS — M109 Gout, unspecified: Secondary | ICD-10-CM | POA: Diagnosis not present

## 2017-01-07 DIAGNOSIS — I10 Essential (primary) hypertension: Secondary | ICD-10-CM | POA: Diagnosis not present

## 2017-01-08 DIAGNOSIS — I10 Essential (primary) hypertension: Secondary | ICD-10-CM | POA: Diagnosis not present

## 2017-01-08 DIAGNOSIS — N320 Bladder-neck obstruction: Secondary | ICD-10-CM | POA: Diagnosis not present

## 2017-01-08 DIAGNOSIS — N183 Chronic kidney disease, stage 3 (moderate): Secondary | ICD-10-CM | POA: Diagnosis not present

## 2017-01-08 DIAGNOSIS — M545 Low back pain: Secondary | ICD-10-CM | POA: Diagnosis not present

## 2017-01-08 DIAGNOSIS — R600 Localized edema: Secondary | ICD-10-CM | POA: Diagnosis not present

## 2017-01-14 DIAGNOSIS — M5136 Other intervertebral disc degeneration, lumbar region: Secondary | ICD-10-CM | POA: Diagnosis not present

## 2017-01-14 DIAGNOSIS — M2578 Osteophyte, vertebrae: Secondary | ICD-10-CM | POA: Diagnosis not present

## 2017-01-14 DIAGNOSIS — M48061 Spinal stenosis, lumbar region without neurogenic claudication: Secondary | ICD-10-CM | POA: Diagnosis not present

## 2017-01-14 DIAGNOSIS — M5126 Other intervertebral disc displacement, lumbar region: Secondary | ICD-10-CM | POA: Diagnosis not present

## 2017-01-14 DIAGNOSIS — M1288 Other specific arthropathies, not elsewhere classified, other specified site: Secondary | ICD-10-CM | POA: Diagnosis not present

## 2017-01-14 DIAGNOSIS — R609 Edema, unspecified: Secondary | ICD-10-CM | POA: Diagnosis not present

## 2017-01-15 DIAGNOSIS — I10 Essential (primary) hypertension: Secondary | ICD-10-CM | POA: Diagnosis not present

## 2017-01-15 DIAGNOSIS — N182 Chronic kidney disease, stage 2 (mild): Secondary | ICD-10-CM | POA: Diagnosis not present

## 2017-01-15 DIAGNOSIS — R9389 Abnormal findings on diagnostic imaging of other specified body structures: Secondary | ICD-10-CM | POA: Diagnosis not present

## 2017-01-15 DIAGNOSIS — M545 Low back pain: Secondary | ICD-10-CM | POA: Diagnosis not present

## 2017-01-17 DIAGNOSIS — H35033 Hypertensive retinopathy, bilateral: Secondary | ICD-10-CM | POA: Diagnosis not present

## 2017-01-17 DIAGNOSIS — H2513 Age-related nuclear cataract, bilateral: Secondary | ICD-10-CM | POA: Diagnosis not present

## 2017-01-22 DIAGNOSIS — M48061 Spinal stenosis, lumbar region without neurogenic claudication: Secondary | ICD-10-CM | POA: Diagnosis not present

## 2017-01-22 DIAGNOSIS — R9389 Abnormal findings on diagnostic imaging of other specified body structures: Secondary | ICD-10-CM | POA: Diagnosis not present

## 2017-01-22 DIAGNOSIS — M7989 Other specified soft tissue disorders: Secondary | ICD-10-CM | POA: Diagnosis not present

## 2017-01-27 DIAGNOSIS — M4620 Osteomyelitis of vertebra, site unspecified: Secondary | ICD-10-CM | POA: Diagnosis not present

## 2017-01-29 DIAGNOSIS — N183 Chronic kidney disease, stage 3 (moderate): Secondary | ICD-10-CM | POA: Diagnosis not present

## 2017-01-29 DIAGNOSIS — R9389 Abnormal findings on diagnostic imaging of other specified body structures: Secondary | ICD-10-CM | POA: Diagnosis not present

## 2017-01-30 DIAGNOSIS — M545 Low back pain: Secondary | ICD-10-CM | POA: Diagnosis not present

## 2017-02-05 DIAGNOSIS — T2125XA Burn of second degree of buttock, initial encounter: Secondary | ICD-10-CM | POA: Diagnosis not present

## 2017-02-10 DIAGNOSIS — I70203 Unspecified atherosclerosis of native arteries of extremities, bilateral legs: Secondary | ICD-10-CM | POA: Diagnosis not present

## 2017-02-10 DIAGNOSIS — M549 Dorsalgia, unspecified: Secondary | ICD-10-CM | POA: Diagnosis not present

## 2017-02-10 DIAGNOSIS — I739 Peripheral vascular disease, unspecified: Secondary | ICD-10-CM | POA: Diagnosis not present

## 2017-02-10 DIAGNOSIS — M2010 Hallux valgus (acquired), unspecified foot: Secondary | ICD-10-CM | POA: Diagnosis not present

## 2017-02-10 DIAGNOSIS — L97502 Non-pressure chronic ulcer of other part of unspecified foot with fat layer exposed: Secondary | ICD-10-CM | POA: Diagnosis not present

## 2017-02-10 DIAGNOSIS — M79676 Pain in unspecified toe(s): Secondary | ICD-10-CM | POA: Diagnosis not present

## 2017-02-10 DIAGNOSIS — G8929 Other chronic pain: Secondary | ICD-10-CM | POA: Diagnosis not present

## 2017-02-10 DIAGNOSIS — E1151 Type 2 diabetes mellitus with diabetic peripheral angiopathy without gangrene: Secondary | ICD-10-CM | POA: Diagnosis not present

## 2017-02-10 DIAGNOSIS — S90851A Superficial foreign body, right foot, initial encounter: Secondary | ICD-10-CM | POA: Diagnosis not present

## 2017-02-24 DIAGNOSIS — I10 Essential (primary) hypertension: Secondary | ICD-10-CM | POA: Diagnosis not present

## 2017-02-24 DIAGNOSIS — R234 Changes in skin texture: Secondary | ICD-10-CM | POA: Diagnosis not present

## 2017-02-24 DIAGNOSIS — M109 Gout, unspecified: Secondary | ICD-10-CM | POA: Diagnosis not present

## 2017-02-24 DIAGNOSIS — F320 Major depressive disorder, single episode, mild: Secondary | ICD-10-CM | POA: Diagnosis not present

## 2017-02-24 DIAGNOSIS — Z125 Encounter for screening for malignant neoplasm of prostate: Secondary | ICD-10-CM | POA: Diagnosis not present

## 2017-02-24 DIAGNOSIS — G1229 Other motor neuron disease: Secondary | ICD-10-CM | POA: Diagnosis not present

## 2017-02-24 DIAGNOSIS — I739 Peripheral vascular disease, unspecified: Secondary | ICD-10-CM | POA: Diagnosis not present

## 2017-02-24 DIAGNOSIS — N182 Chronic kidney disease, stage 2 (mild): Secondary | ICD-10-CM | POA: Diagnosis not present

## 2017-02-24 DIAGNOSIS — M545 Low back pain: Secondary | ICD-10-CM | POA: Diagnosis not present

## 2017-02-24 DIAGNOSIS — Z89429 Acquired absence of other toe(s), unspecified side: Secondary | ICD-10-CM | POA: Diagnosis not present

## 2017-02-24 DIAGNOSIS — C61 Malignant neoplasm of prostate: Secondary | ICD-10-CM | POA: Diagnosis not present

## 2017-02-24 DIAGNOSIS — R32 Unspecified urinary incontinence: Secondary | ICD-10-CM | POA: Diagnosis not present

## 2017-03-10 DIAGNOSIS — M5412 Radiculopathy, cervical region: Secondary | ICD-10-CM | POA: Diagnosis not present

## 2017-03-10 DIAGNOSIS — M5416 Radiculopathy, lumbar region: Secondary | ICD-10-CM | POA: Diagnosis not present

## 2017-03-10 DIAGNOSIS — M545 Low back pain: Secondary | ICD-10-CM | POA: Diagnosis not present

## 2017-03-20 DIAGNOSIS — M9902 Segmental and somatic dysfunction of thoracic region: Secondary | ICD-10-CM | POA: Diagnosis not present

## 2017-03-20 DIAGNOSIS — M9903 Segmental and somatic dysfunction of lumbar region: Secondary | ICD-10-CM | POA: Diagnosis not present

## 2017-03-20 DIAGNOSIS — M5134 Other intervertebral disc degeneration, thoracic region: Secondary | ICD-10-CM | POA: Diagnosis not present

## 2017-03-20 DIAGNOSIS — M5136 Other intervertebral disc degeneration, lumbar region: Secondary | ICD-10-CM | POA: Diagnosis not present

## 2017-03-21 DIAGNOSIS — M5136 Other intervertebral disc degeneration, lumbar region: Secondary | ICD-10-CM | POA: Diagnosis not present

## 2017-03-21 DIAGNOSIS — M9902 Segmental and somatic dysfunction of thoracic region: Secondary | ICD-10-CM | POA: Diagnosis not present

## 2017-03-21 DIAGNOSIS — M9903 Segmental and somatic dysfunction of lumbar region: Secondary | ICD-10-CM | POA: Diagnosis not present

## 2017-03-21 DIAGNOSIS — M5134 Other intervertebral disc degeneration, thoracic region: Secondary | ICD-10-CM | POA: Diagnosis not present

## 2017-04-03 DIAGNOSIS — M9903 Segmental and somatic dysfunction of lumbar region: Secondary | ICD-10-CM | POA: Diagnosis not present

## 2017-04-03 DIAGNOSIS — M545 Low back pain: Secondary | ICD-10-CM | POA: Diagnosis not present

## 2017-04-05 DIAGNOSIS — M9903 Segmental and somatic dysfunction of lumbar region: Secondary | ICD-10-CM | POA: Diagnosis not present

## 2017-04-05 DIAGNOSIS — M545 Low back pain: Secondary | ICD-10-CM | POA: Diagnosis not present

## 2017-04-07 DIAGNOSIS — M545 Low back pain: Secondary | ICD-10-CM | POA: Diagnosis not present

## 2017-04-07 DIAGNOSIS — M9903 Segmental and somatic dysfunction of lumbar region: Secondary | ICD-10-CM | POA: Diagnosis not present

## 2017-04-10 DIAGNOSIS — M545 Low back pain: Secondary | ICD-10-CM | POA: Diagnosis not present

## 2017-04-10 DIAGNOSIS — M9903 Segmental and somatic dysfunction of lumbar region: Secondary | ICD-10-CM | POA: Diagnosis not present

## 2017-04-14 DIAGNOSIS — M545 Low back pain: Secondary | ICD-10-CM | POA: Diagnosis not present

## 2017-04-14 DIAGNOSIS — M9903 Segmental and somatic dysfunction of lumbar region: Secondary | ICD-10-CM | POA: Diagnosis not present

## 2017-04-15 DIAGNOSIS — E1151 Type 2 diabetes mellitus with diabetic peripheral angiopathy without gangrene: Secondary | ICD-10-CM | POA: Diagnosis not present

## 2017-04-15 DIAGNOSIS — M2010 Hallux valgus (acquired), unspecified foot: Secondary | ICD-10-CM | POA: Diagnosis not present

## 2017-04-15 DIAGNOSIS — I739 Peripheral vascular disease, unspecified: Secondary | ICD-10-CM | POA: Diagnosis not present

## 2017-04-15 DIAGNOSIS — S90851A Superficial foreign body, right foot, initial encounter: Secondary | ICD-10-CM | POA: Diagnosis not present

## 2017-04-15 DIAGNOSIS — I70203 Unspecified atherosclerosis of native arteries of extremities, bilateral legs: Secondary | ICD-10-CM | POA: Diagnosis not present

## 2017-04-15 DIAGNOSIS — M545 Low back pain: Secondary | ICD-10-CM | POA: Diagnosis not present

## 2017-04-15 DIAGNOSIS — L97502 Non-pressure chronic ulcer of other part of unspecified foot with fat layer exposed: Secondary | ICD-10-CM | POA: Diagnosis not present

## 2017-04-15 DIAGNOSIS — M9903 Segmental and somatic dysfunction of lumbar region: Secondary | ICD-10-CM | POA: Diagnosis not present

## 2017-04-15 DIAGNOSIS — M79676 Pain in unspecified toe(s): Secondary | ICD-10-CM | POA: Diagnosis not present

## 2017-04-16 DIAGNOSIS — D649 Anemia, unspecified: Secondary | ICD-10-CM | POA: Diagnosis not present

## 2017-04-16 DIAGNOSIS — G309 Alzheimer's disease, unspecified: Secondary | ICD-10-CM | POA: Diagnosis not present

## 2017-04-16 DIAGNOSIS — E1121 Type 2 diabetes mellitus with diabetic nephropathy: Secondary | ICD-10-CM | POA: Diagnosis not present

## 2017-04-16 DIAGNOSIS — M545 Low back pain: Secondary | ICD-10-CM | POA: Diagnosis not present

## 2017-04-16 DIAGNOSIS — N182 Chronic kidney disease, stage 2 (mild): Secondary | ICD-10-CM | POA: Diagnosis not present

## 2017-04-16 DIAGNOSIS — I10 Essential (primary) hypertension: Secondary | ICD-10-CM | POA: Diagnosis not present

## 2017-04-16 DIAGNOSIS — E785 Hyperlipidemia, unspecified: Secondary | ICD-10-CM | POA: Diagnosis not present

## 2017-04-17 DIAGNOSIS — M9903 Segmental and somatic dysfunction of lumbar region: Secondary | ICD-10-CM | POA: Diagnosis not present

## 2017-04-17 DIAGNOSIS — M545 Low back pain: Secondary | ICD-10-CM | POA: Diagnosis not present

## 2017-04-19 DIAGNOSIS — M545 Low back pain: Secondary | ICD-10-CM | POA: Diagnosis not present

## 2017-04-19 DIAGNOSIS — M9903 Segmental and somatic dysfunction of lumbar region: Secondary | ICD-10-CM | POA: Diagnosis not present

## 2017-04-21 DIAGNOSIS — M545 Low back pain: Secondary | ICD-10-CM | POA: Diagnosis not present

## 2017-04-21 DIAGNOSIS — M9903 Segmental and somatic dysfunction of lumbar region: Secondary | ICD-10-CM | POA: Diagnosis not present

## 2017-04-25 DIAGNOSIS — M9903 Segmental and somatic dysfunction of lumbar region: Secondary | ICD-10-CM | POA: Diagnosis not present

## 2017-04-25 DIAGNOSIS — M545 Low back pain: Secondary | ICD-10-CM | POA: Diagnosis not present

## 2017-04-28 DIAGNOSIS — M9903 Segmental and somatic dysfunction of lumbar region: Secondary | ICD-10-CM | POA: Diagnosis not present

## 2017-04-28 DIAGNOSIS — M545 Low back pain: Secondary | ICD-10-CM | POA: Diagnosis not present

## 2017-05-01 DIAGNOSIS — M545 Low back pain: Secondary | ICD-10-CM | POA: Diagnosis not present

## 2017-05-01 DIAGNOSIS — M9903 Segmental and somatic dysfunction of lumbar region: Secondary | ICD-10-CM | POA: Diagnosis not present

## 2017-05-05 DIAGNOSIS — M545 Low back pain: Secondary | ICD-10-CM | POA: Diagnosis not present

## 2017-05-05 DIAGNOSIS — M9903 Segmental and somatic dysfunction of lumbar region: Secondary | ICD-10-CM | POA: Diagnosis not present

## 2017-05-08 DIAGNOSIS — M9903 Segmental and somatic dysfunction of lumbar region: Secondary | ICD-10-CM | POA: Diagnosis not present

## 2017-05-08 DIAGNOSIS — M545 Low back pain: Secondary | ICD-10-CM | POA: Diagnosis not present

## 2017-05-12 DIAGNOSIS — M545 Low back pain: Secondary | ICD-10-CM | POA: Diagnosis not present

## 2017-05-12 DIAGNOSIS — M9903 Segmental and somatic dysfunction of lumbar region: Secondary | ICD-10-CM | POA: Diagnosis not present

## 2017-05-14 DIAGNOSIS — M25561 Pain in right knee: Secondary | ICD-10-CM | POA: Diagnosis not present

## 2017-05-15 DIAGNOSIS — M545 Low back pain: Secondary | ICD-10-CM | POA: Diagnosis not present

## 2017-05-15 DIAGNOSIS — I70203 Unspecified atherosclerosis of native arteries of extremities, bilateral legs: Secondary | ICD-10-CM | POA: Diagnosis not present

## 2017-05-15 DIAGNOSIS — M79676 Pain in unspecified toe(s): Secondary | ICD-10-CM | POA: Diagnosis not present

## 2017-05-15 DIAGNOSIS — M2010 Hallux valgus (acquired), unspecified foot: Secondary | ICD-10-CM | POA: Diagnosis not present

## 2017-05-15 DIAGNOSIS — E1151 Type 2 diabetes mellitus with diabetic peripheral angiopathy without gangrene: Secondary | ICD-10-CM | POA: Diagnosis not present

## 2017-05-15 DIAGNOSIS — L97502 Non-pressure chronic ulcer of other part of unspecified foot with fat layer exposed: Secondary | ICD-10-CM | POA: Diagnosis not present

## 2017-05-15 DIAGNOSIS — I739 Peripheral vascular disease, unspecified: Secondary | ICD-10-CM | POA: Diagnosis not present

## 2017-05-15 DIAGNOSIS — M9903 Segmental and somatic dysfunction of lumbar region: Secondary | ICD-10-CM | POA: Diagnosis not present

## 2017-05-15 DIAGNOSIS — S90851A Superficial foreign body, right foot, initial encounter: Secondary | ICD-10-CM | POA: Diagnosis not present

## 2017-05-19 DIAGNOSIS — M545 Low back pain: Secondary | ICD-10-CM | POA: Diagnosis not present

## 2017-05-19 DIAGNOSIS — M9903 Segmental and somatic dysfunction of lumbar region: Secondary | ICD-10-CM | POA: Diagnosis not present

## 2017-05-22 DIAGNOSIS — M545 Low back pain: Secondary | ICD-10-CM | POA: Diagnosis not present

## 2017-05-22 DIAGNOSIS — M9903 Segmental and somatic dysfunction of lumbar region: Secondary | ICD-10-CM | POA: Diagnosis not present

## 2017-05-26 DIAGNOSIS — M25561 Pain in right knee: Secondary | ICD-10-CM | POA: Diagnosis not present

## 2017-05-26 DIAGNOSIS — M9903 Segmental and somatic dysfunction of lumbar region: Secondary | ICD-10-CM | POA: Diagnosis not present

## 2017-05-26 DIAGNOSIS — M545 Low back pain: Secondary | ICD-10-CM | POA: Diagnosis not present

## 2017-05-28 DIAGNOSIS — M25561 Pain in right knee: Secondary | ICD-10-CM | POA: Diagnosis not present

## 2017-05-29 DIAGNOSIS — M9903 Segmental and somatic dysfunction of lumbar region: Secondary | ICD-10-CM | POA: Diagnosis not present

## 2017-05-29 DIAGNOSIS — M545 Low back pain: Secondary | ICD-10-CM | POA: Diagnosis not present

## 2017-06-02 DIAGNOSIS — M545 Low back pain: Secondary | ICD-10-CM | POA: Diagnosis not present

## 2017-06-02 DIAGNOSIS — M9903 Segmental and somatic dysfunction of lumbar region: Secondary | ICD-10-CM | POA: Diagnosis not present

## 2017-06-03 DIAGNOSIS — M25561 Pain in right knee: Secondary | ICD-10-CM | POA: Diagnosis not present

## 2017-06-04 DIAGNOSIS — M25561 Pain in right knee: Secondary | ICD-10-CM | POA: Diagnosis not present

## 2017-06-05 DIAGNOSIS — M9903 Segmental and somatic dysfunction of lumbar region: Secondary | ICD-10-CM | POA: Diagnosis not present

## 2017-06-05 DIAGNOSIS — M545 Low back pain: Secondary | ICD-10-CM | POA: Diagnosis not present

## 2017-06-09 DIAGNOSIS — M545 Low back pain: Secondary | ICD-10-CM | POA: Diagnosis not present

## 2017-06-09 DIAGNOSIS — M9903 Segmental and somatic dysfunction of lumbar region: Secondary | ICD-10-CM | POA: Diagnosis not present

## 2017-06-10 DIAGNOSIS — M25561 Pain in right knee: Secondary | ICD-10-CM | POA: Diagnosis not present

## 2017-06-12 DIAGNOSIS — M9903 Segmental and somatic dysfunction of lumbar region: Secondary | ICD-10-CM | POA: Diagnosis not present

## 2017-06-12 DIAGNOSIS — M545 Low back pain: Secondary | ICD-10-CM | POA: Diagnosis not present

## 2017-06-13 DIAGNOSIS — M25561 Pain in right knee: Secondary | ICD-10-CM | POA: Diagnosis not present

## 2017-06-16 DIAGNOSIS — M9903 Segmental and somatic dysfunction of lumbar region: Secondary | ICD-10-CM | POA: Diagnosis not present

## 2017-06-16 DIAGNOSIS — M25561 Pain in right knee: Secondary | ICD-10-CM | POA: Diagnosis not present

## 2017-06-16 DIAGNOSIS — M545 Low back pain: Secondary | ICD-10-CM | POA: Diagnosis not present

## 2017-06-18 DIAGNOSIS — F0390 Unspecified dementia without behavioral disturbance: Secondary | ICD-10-CM | POA: Diagnosis not present

## 2017-06-18 DIAGNOSIS — G1221 Amyotrophic lateral sclerosis: Secondary | ICD-10-CM | POA: Diagnosis not present

## 2017-06-18 DIAGNOSIS — G459 Transient cerebral ischemic attack, unspecified: Secondary | ICD-10-CM | POA: Diagnosis not present

## 2017-06-19 DIAGNOSIS — M545 Low back pain: Secondary | ICD-10-CM | POA: Diagnosis not present

## 2017-06-19 DIAGNOSIS — M9903 Segmental and somatic dysfunction of lumbar region: Secondary | ICD-10-CM | POA: Diagnosis not present

## 2017-06-20 DIAGNOSIS — M25561 Pain in right knee: Secondary | ICD-10-CM | POA: Diagnosis not present

## 2017-06-23 DIAGNOSIS — M9903 Segmental and somatic dysfunction of lumbar region: Secondary | ICD-10-CM | POA: Diagnosis not present

## 2017-06-23 DIAGNOSIS — M545 Low back pain: Secondary | ICD-10-CM | POA: Diagnosis not present

## 2017-07-09 DIAGNOSIS — M5416 Radiculopathy, lumbar region: Secondary | ICD-10-CM | POA: Diagnosis not present

## 2017-07-09 DIAGNOSIS — M8008XG Age-related osteoporosis with current pathological fracture, vertebra(e), subsequent encounter for fracture with delayed healing: Secondary | ICD-10-CM | POA: Diagnosis not present

## 2017-07-14 DIAGNOSIS — M25561 Pain in right knee: Secondary | ICD-10-CM | POA: Diagnosis not present

## 2017-07-14 DIAGNOSIS — Z125 Encounter for screening for malignant neoplasm of prostate: Secondary | ICD-10-CM | POA: Diagnosis not present

## 2017-07-14 DIAGNOSIS — E1121 Type 2 diabetes mellitus with diabetic nephropathy: Secondary | ICD-10-CM | POA: Diagnosis not present

## 2017-07-14 DIAGNOSIS — N183 Chronic kidney disease, stage 3 (moderate): Secondary | ICD-10-CM | POA: Diagnosis not present

## 2017-07-14 DIAGNOSIS — R5383 Other fatigue: Secondary | ICD-10-CM | POA: Diagnosis not present

## 2017-07-14 DIAGNOSIS — R972 Elevated prostate specific antigen [PSA]: Secondary | ICD-10-CM | POA: Diagnosis not present

## 2017-07-14 LAB — PSA: PSA: 11

## 2017-07-14 LAB — VITAMIN B12: Vitamin B-12: 699

## 2017-07-19 DIAGNOSIS — M48061 Spinal stenosis, lumbar region without neurogenic claudication: Secondary | ICD-10-CM | POA: Diagnosis not present

## 2017-07-19 DIAGNOSIS — M1288 Other specific arthropathies, not elsewhere classified, other specified site: Secondary | ICD-10-CM | POA: Diagnosis not present

## 2017-07-19 DIAGNOSIS — R609 Edema, unspecified: Secondary | ICD-10-CM | POA: Diagnosis not present

## 2017-07-23 DIAGNOSIS — M8008XG Age-related osteoporosis with current pathological fracture, vertebra(e), subsequent encounter for fracture with delayed healing: Secondary | ICD-10-CM | POA: Diagnosis not present

## 2017-07-28 DIAGNOSIS — M25562 Pain in left knee: Secondary | ICD-10-CM | POA: Diagnosis not present

## 2017-07-28 DIAGNOSIS — M1711 Unilateral primary osteoarthritis, right knee: Secondary | ICD-10-CM | POA: Diagnosis not present

## 2017-07-28 DIAGNOSIS — M170 Bilateral primary osteoarthritis of knee: Secondary | ICD-10-CM | POA: Diagnosis not present

## 2017-07-28 DIAGNOSIS — M25561 Pain in right knee: Secondary | ICD-10-CM | POA: Diagnosis not present

## 2017-08-08 DIAGNOSIS — S32009A Unspecified fracture of unspecified lumbar vertebra, initial encounter for closed fracture: Secondary | ICD-10-CM | POA: Diagnosis not present

## 2017-08-08 DIAGNOSIS — I10 Essential (primary) hypertension: Secondary | ICD-10-CM | POA: Diagnosis not present

## 2017-08-08 DIAGNOSIS — M8008XG Age-related osteoporosis with current pathological fracture, vertebra(e), subsequent encounter for fracture with delayed healing: Secondary | ICD-10-CM | POA: Diagnosis not present

## 2017-08-08 DIAGNOSIS — C61 Malignant neoplasm of prostate: Secondary | ICD-10-CM | POA: Diagnosis not present

## 2017-08-25 DIAGNOSIS — R972 Elevated prostate specific antigen [PSA]: Secondary | ICD-10-CM | POA: Diagnosis not present

## 2017-08-25 DIAGNOSIS — N183 Chronic kidney disease, stage 3 (moderate): Secondary | ICD-10-CM | POA: Diagnosis not present

## 2017-08-25 DIAGNOSIS — E1121 Type 2 diabetes mellitus with diabetic nephropathy: Secondary | ICD-10-CM | POA: Diagnosis not present

## 2017-08-25 DIAGNOSIS — M25561 Pain in right knee: Secondary | ICD-10-CM | POA: Diagnosis not present

## 2017-09-03 DIAGNOSIS — M5416 Radiculopathy, lumbar region: Secondary | ICD-10-CM | POA: Diagnosis not present

## 2017-09-10 DIAGNOSIS — M545 Low back pain: Secondary | ICD-10-CM | POA: Diagnosis not present

## 2017-09-10 DIAGNOSIS — M47816 Spondylosis without myelopathy or radiculopathy, lumbar region: Secondary | ICD-10-CM | POA: Diagnosis not present

## 2017-09-10 DIAGNOSIS — M5136 Other intervertebral disc degeneration, lumbar region: Secondary | ICD-10-CM | POA: Diagnosis not present

## 2017-09-29 DIAGNOSIS — M8008XG Age-related osteoporosis with current pathological fracture, vertebra(e), subsequent encounter for fracture with delayed healing: Secondary | ICD-10-CM | POA: Diagnosis not present

## 2017-09-29 DIAGNOSIS — C61 Malignant neoplasm of prostate: Secondary | ICD-10-CM | POA: Diagnosis not present

## 2017-10-03 DIAGNOSIS — M48061 Spinal stenosis, lumbar region without neurogenic claudication: Secondary | ICD-10-CM | POA: Diagnosis not present

## 2017-10-03 DIAGNOSIS — M810 Age-related osteoporosis without current pathological fracture: Secondary | ICD-10-CM | POA: Diagnosis not present

## 2017-10-03 DIAGNOSIS — M5136 Other intervertebral disc degeneration, lumbar region: Secondary | ICD-10-CM | POA: Diagnosis not present

## 2017-10-03 DIAGNOSIS — S32000A Wedge compression fracture of unspecified lumbar vertebra, initial encounter for closed fracture: Secondary | ICD-10-CM | POA: Diagnosis not present

## 2017-10-03 DIAGNOSIS — R609 Edema, unspecified: Secondary | ICD-10-CM | POA: Diagnosis not present

## 2017-10-03 DIAGNOSIS — M2578 Osteophyte, vertebrae: Secondary | ICD-10-CM | POA: Diagnosis not present

## 2017-10-03 DIAGNOSIS — M1288 Other specific arthropathies, not elsewhere classified, other specified site: Secondary | ICD-10-CM | POA: Diagnosis not present

## 2017-10-03 DIAGNOSIS — M5126 Other intervertebral disc displacement, lumbar region: Secondary | ICD-10-CM | POA: Diagnosis not present

## 2017-10-03 DIAGNOSIS — Z9889 Other specified postprocedural states: Secondary | ICD-10-CM | POA: Diagnosis not present

## 2017-10-15 DIAGNOSIS — M5416 Radiculopathy, lumbar region: Secondary | ICD-10-CM | POA: Diagnosis not present

## 2017-10-22 DIAGNOSIS — M5416 Radiculopathy, lumbar region: Secondary | ICD-10-CM | POA: Diagnosis not present

## 2017-10-30 DIAGNOSIS — I70203 Unspecified atherosclerosis of native arteries of extremities, bilateral legs: Secondary | ICD-10-CM | POA: Diagnosis not present

## 2017-10-30 DIAGNOSIS — E1151 Type 2 diabetes mellitus with diabetic peripheral angiopathy without gangrene: Secondary | ICD-10-CM | POA: Diagnosis not present

## 2017-10-30 DIAGNOSIS — M2010 Hallux valgus (acquired), unspecified foot: Secondary | ICD-10-CM | POA: Diagnosis not present

## 2017-10-30 DIAGNOSIS — M79676 Pain in unspecified toe(s): Secondary | ICD-10-CM | POA: Diagnosis not present

## 2017-10-30 DIAGNOSIS — S90851A Superficial foreign body, right foot, initial encounter: Secondary | ICD-10-CM | POA: Diagnosis not present

## 2017-10-30 DIAGNOSIS — L97502 Non-pressure chronic ulcer of other part of unspecified foot with fat layer exposed: Secondary | ICD-10-CM | POA: Diagnosis not present

## 2017-10-30 DIAGNOSIS — I739 Peripheral vascular disease, unspecified: Secondary | ICD-10-CM | POA: Diagnosis not present

## 2017-11-12 DIAGNOSIS — C61 Malignant neoplasm of prostate: Secondary | ICD-10-CM | POA: Diagnosis not present

## 2017-11-12 DIAGNOSIS — E11621 Type 2 diabetes mellitus with foot ulcer: Secondary | ICD-10-CM | POA: Diagnosis not present

## 2017-11-12 DIAGNOSIS — M5416 Radiculopathy, lumbar region: Secondary | ICD-10-CM | POA: Diagnosis not present

## 2017-11-12 DIAGNOSIS — E1140 Type 2 diabetes mellitus with diabetic neuropathy, unspecified: Secondary | ICD-10-CM | POA: Diagnosis not present

## 2017-11-12 DIAGNOSIS — E1151 Type 2 diabetes mellitus with diabetic peripheral angiopathy without gangrene: Secondary | ICD-10-CM | POA: Diagnosis not present

## 2017-11-12 DIAGNOSIS — L97519 Non-pressure chronic ulcer of other part of right foot with unspecified severity: Secondary | ICD-10-CM | POA: Diagnosis not present

## 2017-11-12 DIAGNOSIS — M8008XG Age-related osteoporosis with current pathological fracture, vertebra(e), subsequent encounter for fracture with delayed healing: Secondary | ICD-10-CM | POA: Diagnosis not present

## 2017-11-12 DIAGNOSIS — E1142 Type 2 diabetes mellitus with diabetic polyneuropathy: Secondary | ICD-10-CM | POA: Diagnosis not present

## 2017-11-13 DIAGNOSIS — E1140 Type 2 diabetes mellitus with diabetic neuropathy, unspecified: Secondary | ICD-10-CM | POA: Diagnosis not present

## 2017-11-13 DIAGNOSIS — E1151 Type 2 diabetes mellitus with diabetic peripheral angiopathy without gangrene: Secondary | ICD-10-CM | POA: Diagnosis not present

## 2017-11-13 DIAGNOSIS — L97519 Non-pressure chronic ulcer of other part of right foot with unspecified severity: Secondary | ICD-10-CM | POA: Diagnosis not present

## 2017-11-13 DIAGNOSIS — E11621 Type 2 diabetes mellitus with foot ulcer: Secondary | ICD-10-CM | POA: Diagnosis not present

## 2017-11-14 DIAGNOSIS — I70261 Atherosclerosis of native arteries of extremities with gangrene, right leg: Secondary | ICD-10-CM | POA: Diagnosis not present

## 2017-12-05 DIAGNOSIS — Z95820 Peripheral vascular angioplasty status with implants and grafts: Secondary | ICD-10-CM | POA: Diagnosis not present

## 2017-12-05 DIAGNOSIS — Z01810 Encounter for preprocedural cardiovascular examination: Secondary | ICD-10-CM | POA: Diagnosis not present

## 2017-12-08 DIAGNOSIS — Z23 Encounter for immunization: Secondary | ICD-10-CM | POA: Diagnosis not present

## 2017-12-08 DIAGNOSIS — Z01818 Encounter for other preprocedural examination: Secondary | ICD-10-CM | POA: Diagnosis not present

## 2017-12-08 DIAGNOSIS — I739 Peripheral vascular disease, unspecified: Secondary | ICD-10-CM | POA: Diagnosis not present

## 2017-12-12 DIAGNOSIS — I70221 Atherosclerosis of native arteries of extremities with rest pain, right leg: Secondary | ICD-10-CM | POA: Diagnosis not present

## 2017-12-12 DIAGNOSIS — E1151 Type 2 diabetes mellitus with diabetic peripheral angiopathy without gangrene: Secondary | ICD-10-CM | POA: Diagnosis not present

## 2017-12-12 DIAGNOSIS — I739 Peripheral vascular disease, unspecified: Secondary | ICD-10-CM | POA: Diagnosis not present

## 2017-12-24 DIAGNOSIS — M79671 Pain in right foot: Secondary | ICD-10-CM | POA: Diagnosis not present

## 2017-12-24 DIAGNOSIS — I70261 Atherosclerosis of native arteries of extremities with gangrene, right leg: Secondary | ICD-10-CM | POA: Diagnosis not present

## 2018-01-07 DIAGNOSIS — I70261 Atherosclerosis of native arteries of extremities with gangrene, right leg: Secondary | ICD-10-CM | POA: Diagnosis not present

## 2018-01-11 HISTORY — PX: ANGIOPLASTY: SHX39

## 2018-01-26 DIAGNOSIS — M1711 Unilateral primary osteoarthritis, right knee: Secondary | ICD-10-CM | POA: Diagnosis not present

## 2018-01-26 DIAGNOSIS — M25562 Pain in left knee: Secondary | ICD-10-CM | POA: Diagnosis not present

## 2018-01-26 DIAGNOSIS — M25561 Pain in right knee: Secondary | ICD-10-CM | POA: Diagnosis not present

## 2018-03-05 DIAGNOSIS — H35033 Hypertensive retinopathy, bilateral: Secondary | ICD-10-CM | POA: Diagnosis not present

## 2018-03-05 DIAGNOSIS — H43813 Vitreous degeneration, bilateral: Secondary | ICD-10-CM | POA: Diagnosis not present

## 2018-03-05 DIAGNOSIS — H2513 Age-related nuclear cataract, bilateral: Secondary | ICD-10-CM | POA: Diagnosis not present

## 2018-03-09 DIAGNOSIS — E785 Hyperlipidemia, unspecified: Secondary | ICD-10-CM | POA: Diagnosis not present

## 2018-03-09 DIAGNOSIS — N400 Enlarged prostate without lower urinary tract symptoms: Secondary | ICD-10-CM | POA: Diagnosis not present

## 2018-03-09 DIAGNOSIS — E1121 Type 2 diabetes mellitus with diabetic nephropathy: Secondary | ICD-10-CM | POA: Diagnosis not present

## 2018-03-09 DIAGNOSIS — I10 Essential (primary) hypertension: Secondary | ICD-10-CM | POA: Diagnosis not present

## 2018-03-09 LAB — LIPID PANEL
Cholesterol: 144 (ref 0–200)
HDL: 48 (ref 35–70)
LDL Cholesterol: 66
LDl/HDL Ratio: 3
Triglycerides: 189 — AB (ref 40–160)

## 2018-03-09 LAB — PSA: PSA: 13.2

## 2018-03-09 LAB — HEMOGLOBIN A1C: Hemoglobin A1C: 6.8

## 2018-03-09 LAB — TSH: TSH: 2.28 (ref 0.41–5.90)

## 2018-03-14 DIAGNOSIS — I219 Acute myocardial infarction, unspecified: Secondary | ICD-10-CM

## 2018-03-14 HISTORY — DX: Acute myocardial infarction, unspecified: I21.9

## 2018-03-14 HISTORY — PX: CARDIAC CATHETERIZATION: SHX172

## 2018-03-16 DIAGNOSIS — N183 Chronic kidney disease, stage 3 (moderate): Secondary | ICD-10-CM | POA: Diagnosis not present

## 2018-03-16 DIAGNOSIS — Z0000 Encounter for general adult medical examination without abnormal findings: Secondary | ICD-10-CM | POA: Diagnosis not present

## 2018-03-16 DIAGNOSIS — F320 Major depressive disorder, single episode, mild: Secondary | ICD-10-CM | POA: Diagnosis not present

## 2018-03-16 DIAGNOSIS — E1121 Type 2 diabetes mellitus with diabetic nephropathy: Secondary | ICD-10-CM | POA: Diagnosis not present

## 2018-03-16 DIAGNOSIS — Z89429 Acquired absence of other toe(s), unspecified side: Secondary | ICD-10-CM | POA: Diagnosis not present

## 2018-03-16 DIAGNOSIS — R350 Frequency of micturition: Secondary | ICD-10-CM | POA: Diagnosis not present

## 2018-03-16 DIAGNOSIS — G309 Alzheimer's disease, unspecified: Secondary | ICD-10-CM | POA: Diagnosis not present

## 2018-03-16 DIAGNOSIS — N138 Other obstructive and reflux uropathy: Secondary | ICD-10-CM | POA: Diagnosis not present

## 2018-03-16 DIAGNOSIS — Z23 Encounter for immunization: Secondary | ICD-10-CM | POA: Diagnosis not present

## 2018-03-16 DIAGNOSIS — C61 Malignant neoplasm of prostate: Secondary | ICD-10-CM | POA: Diagnosis not present

## 2018-03-16 DIAGNOSIS — I739 Peripheral vascular disease, unspecified: Secondary | ICD-10-CM | POA: Diagnosis not present

## 2018-03-16 DIAGNOSIS — G1229 Other motor neuron disease: Secondary | ICD-10-CM | POA: Diagnosis not present

## 2018-03-21 DIAGNOSIS — E860 Dehydration: Secondary | ICD-10-CM | POA: Diagnosis present

## 2018-03-21 DIAGNOSIS — Z794 Long term (current) use of insulin: Secondary | ICD-10-CM | POA: Diagnosis not present

## 2018-03-21 DIAGNOSIS — E1140 Type 2 diabetes mellitus with diabetic neuropathy, unspecified: Secondary | ICD-10-CM | POA: Diagnosis not present

## 2018-03-21 DIAGNOSIS — Z89421 Acquired absence of other right toe(s): Secondary | ICD-10-CM | POA: Diagnosis not present

## 2018-03-21 DIAGNOSIS — I739 Peripheral vascular disease, unspecified: Secondary | ICD-10-CM | POA: Diagnosis not present

## 2018-03-21 DIAGNOSIS — T85590A Other mechanical complication of bile duct prosthesis, initial encounter: Secondary | ICD-10-CM | POA: Diagnosis not present

## 2018-03-21 DIAGNOSIS — Z87891 Personal history of nicotine dependence: Secondary | ICD-10-CM | POA: Diagnosis not present

## 2018-03-21 DIAGNOSIS — I10 Essential (primary) hypertension: Secondary | ICD-10-CM | POA: Diagnosis not present

## 2018-03-21 DIAGNOSIS — Z955 Presence of coronary angioplasty implant and graft: Secondary | ICD-10-CM | POA: Diagnosis not present

## 2018-03-21 DIAGNOSIS — I129 Hypertensive chronic kidney disease with stage 1 through stage 4 chronic kidney disease, or unspecified chronic kidney disease: Secondary | ICD-10-CM | POA: Diagnosis present

## 2018-03-21 DIAGNOSIS — Z792 Long term (current) use of antibiotics: Secondary | ICD-10-CM | POA: Diagnosis not present

## 2018-03-21 DIAGNOSIS — Z7902 Long term (current) use of antithrombotics/antiplatelets: Secondary | ICD-10-CM | POA: Diagnosis not present

## 2018-03-21 DIAGNOSIS — J9811 Atelectasis: Secondary | ICD-10-CM | POA: Diagnosis not present

## 2018-03-21 DIAGNOSIS — R109 Unspecified abdominal pain: Secondary | ICD-10-CM | POA: Diagnosis not present

## 2018-03-21 DIAGNOSIS — E785 Hyperlipidemia, unspecified: Secondary | ICD-10-CM | POA: Diagnosis not present

## 2018-03-21 DIAGNOSIS — I200 Unstable angina: Secondary | ICD-10-CM | POA: Diagnosis not present

## 2018-03-21 DIAGNOSIS — I700 Atherosclerosis of aorta: Secondary | ICD-10-CM | POA: Diagnosis not present

## 2018-03-21 DIAGNOSIS — Z7982 Long term (current) use of aspirin: Secondary | ICD-10-CM | POA: Diagnosis not present

## 2018-03-21 DIAGNOSIS — Z79899 Other long term (current) drug therapy: Secondary | ICD-10-CM | POA: Diagnosis not present

## 2018-03-21 DIAGNOSIS — Z880 Allergy status to penicillin: Secondary | ICD-10-CM | POA: Diagnosis not present

## 2018-03-21 DIAGNOSIS — I517 Cardiomegaly: Secondary | ICD-10-CM | POA: Diagnosis not present

## 2018-03-21 DIAGNOSIS — R079 Chest pain, unspecified: Secondary | ICD-10-CM | POA: Diagnosis not present

## 2018-03-21 DIAGNOSIS — I214 Non-ST elevation (NSTEMI) myocardial infarction: Secondary | ICD-10-CM | POA: Diagnosis not present

## 2018-03-21 DIAGNOSIS — I493 Ventricular premature depolarization: Secondary | ICD-10-CM | POA: Diagnosis present

## 2018-03-21 DIAGNOSIS — Z95820 Peripheral vascular angioplasty status with implants and grafts: Secondary | ICD-10-CM | POA: Diagnosis not present

## 2018-03-21 DIAGNOSIS — I2510 Atherosclerotic heart disease of native coronary artery without angina pectoris: Secondary | ICD-10-CM | POA: Diagnosis not present

## 2018-03-21 DIAGNOSIS — F0390 Unspecified dementia without behavioral disturbance: Secondary | ICD-10-CM | POA: Diagnosis not present

## 2018-03-21 DIAGNOSIS — E119 Type 2 diabetes mellitus without complications: Secondary | ICD-10-CM | POA: Diagnosis not present

## 2018-03-21 DIAGNOSIS — E1151 Type 2 diabetes mellitus with diabetic peripheral angiopathy without gangrene: Secondary | ICD-10-CM | POA: Diagnosis present

## 2018-03-21 DIAGNOSIS — K810 Acute cholecystitis: Secondary | ICD-10-CM | POA: Diagnosis not present

## 2018-03-21 DIAGNOSIS — I213 ST elevation (STEMI) myocardial infarction of unspecified site: Secondary | ICD-10-CM | POA: Diagnosis not present

## 2018-03-21 DIAGNOSIS — R197 Diarrhea, unspecified: Secondary | ICD-10-CM | POA: Diagnosis present

## 2018-03-21 DIAGNOSIS — M109 Gout, unspecified: Secondary | ICD-10-CM | POA: Diagnosis not present

## 2018-03-21 DIAGNOSIS — N281 Cyst of kidney, acquired: Secondary | ICD-10-CM | POA: Diagnosis not present

## 2018-03-21 DIAGNOSIS — Z8546 Personal history of malignant neoplasm of prostate: Secondary | ICD-10-CM | POA: Diagnosis not present

## 2018-03-21 DIAGNOSIS — R10811 Right upper quadrant abdominal tenderness: Secondary | ICD-10-CM | POA: Diagnosis not present

## 2018-03-21 DIAGNOSIS — N179 Acute kidney failure, unspecified: Secondary | ICD-10-CM | POA: Diagnosis not present

## 2018-03-21 DIAGNOSIS — N183 Chronic kidney disease, stage 3 (moderate): Secondary | ICD-10-CM | POA: Diagnosis not present

## 2018-03-21 DIAGNOSIS — I951 Orthostatic hypotension: Secondary | ICD-10-CM | POA: Diagnosis present

## 2018-03-21 DIAGNOSIS — E1122 Type 2 diabetes mellitus with diabetic chronic kidney disease: Secondary | ICD-10-CM | POA: Diagnosis not present

## 2018-03-27 DIAGNOSIS — E1151 Type 2 diabetes mellitus with diabetic peripheral angiopathy without gangrene: Secondary | ICD-10-CM | POA: Diagnosis present

## 2018-03-27 DIAGNOSIS — E1122 Type 2 diabetes mellitus with diabetic chronic kidney disease: Secondary | ICD-10-CM | POA: Diagnosis present

## 2018-03-27 DIAGNOSIS — F0390 Unspecified dementia without behavioral disturbance: Secondary | ICD-10-CM | POA: Diagnosis present

## 2018-03-27 DIAGNOSIS — G4733 Obstructive sleep apnea (adult) (pediatric): Secondary | ICD-10-CM | POA: Diagnosis present

## 2018-03-27 DIAGNOSIS — Z9049 Acquired absence of other specified parts of digestive tract: Secondary | ICD-10-CM | POA: Diagnosis not present

## 2018-03-27 DIAGNOSIS — Z87891 Personal history of nicotine dependence: Secondary | ICD-10-CM | POA: Diagnosis not present

## 2018-03-27 DIAGNOSIS — I129 Hypertensive chronic kidney disease with stage 1 through stage 4 chronic kidney disease, or unspecified chronic kidney disease: Secondary | ICD-10-CM | POA: Diagnosis not present

## 2018-03-27 DIAGNOSIS — Z89421 Acquired absence of other right toe(s): Secondary | ICD-10-CM | POA: Diagnosis not present

## 2018-03-27 DIAGNOSIS — N182 Chronic kidney disease, stage 2 (mild): Secondary | ICD-10-CM | POA: Diagnosis present

## 2018-03-27 DIAGNOSIS — Z8673 Personal history of transient ischemic attack (TIA), and cerebral infarction without residual deficits: Secondary | ICD-10-CM | POA: Diagnosis not present

## 2018-03-27 DIAGNOSIS — K828 Other specified diseases of gallbladder: Secondary | ICD-10-CM | POA: Diagnosis present

## 2018-03-27 DIAGNOSIS — E663 Overweight: Secondary | ICD-10-CM | POA: Diagnosis present

## 2018-03-27 DIAGNOSIS — K810 Acute cholecystitis: Secondary | ICD-10-CM | POA: Diagnosis not present

## 2018-03-27 DIAGNOSIS — Z89411 Acquired absence of right great toe: Secondary | ICD-10-CM | POA: Diagnosis not present

## 2018-03-27 DIAGNOSIS — Z9889 Other specified postprocedural states: Secondary | ICD-10-CM | POA: Diagnosis not present

## 2018-03-27 DIAGNOSIS — I209 Angina pectoris, unspecified: Secondary | ICD-10-CM | POA: Diagnosis not present

## 2018-03-27 DIAGNOSIS — K589 Irritable bowel syndrome without diarrhea: Secondary | ICD-10-CM | POA: Diagnosis present

## 2018-03-27 DIAGNOSIS — Z8546 Personal history of malignant neoplasm of prostate: Secondary | ICD-10-CM | POA: Diagnosis not present

## 2018-03-27 DIAGNOSIS — I214 Non-ST elevation (NSTEMI) myocardial infarction: Secondary | ICD-10-CM | POA: Diagnosis not present

## 2018-03-27 DIAGNOSIS — Z9181 History of falling: Secondary | ICD-10-CM | POA: Diagnosis not present

## 2018-03-27 DIAGNOSIS — M7989 Other specified soft tissue disorders: Secondary | ICD-10-CM | POA: Diagnosis present

## 2018-03-27 DIAGNOSIS — I2510 Atherosclerotic heart disease of native coronary artery without angina pectoris: Secondary | ICD-10-CM | POA: Diagnosis not present

## 2018-03-27 DIAGNOSIS — M109 Gout, unspecified: Secondary | ICD-10-CM | POA: Diagnosis present

## 2018-03-27 DIAGNOSIS — E785 Hyperlipidemia, unspecified: Secondary | ICD-10-CM | POA: Diagnosis present

## 2018-03-27 DIAGNOSIS — E1140 Type 2 diabetes mellitus with diabetic neuropathy, unspecified: Secondary | ICD-10-CM | POA: Diagnosis present

## 2018-03-27 DIAGNOSIS — K219 Gastro-esophageal reflux disease without esophagitis: Secondary | ICD-10-CM | POA: Diagnosis present

## 2018-03-29 DIAGNOSIS — Z7902 Long term (current) use of antithrombotics/antiplatelets: Secondary | ICD-10-CM | POA: Diagnosis not present

## 2018-03-29 DIAGNOSIS — E1140 Type 2 diabetes mellitus with diabetic neuropathy, unspecified: Secondary | ICD-10-CM | POA: Diagnosis not present

## 2018-03-29 DIAGNOSIS — M109 Gout, unspecified: Secondary | ICD-10-CM | POA: Diagnosis not present

## 2018-03-29 DIAGNOSIS — G4733 Obstructive sleep apnea (adult) (pediatric): Secondary | ICD-10-CM | POA: Diagnosis not present

## 2018-03-29 DIAGNOSIS — I2510 Atherosclerotic heart disease of native coronary artery without angina pectoris: Secondary | ICD-10-CM | POA: Diagnosis not present

## 2018-03-29 DIAGNOSIS — I214 Non-ST elevation (NSTEMI) myocardial infarction: Secondary | ICD-10-CM | POA: Diagnosis not present

## 2018-03-29 DIAGNOSIS — K589 Irritable bowel syndrome without diarrhea: Secondary | ICD-10-CM | POA: Diagnosis not present

## 2018-03-29 DIAGNOSIS — Z95820 Peripheral vascular angioplasty status with implants and grafts: Secondary | ICD-10-CM | POA: Diagnosis not present

## 2018-03-29 DIAGNOSIS — I129 Hypertensive chronic kidney disease with stage 1 through stage 4 chronic kidney disease, or unspecified chronic kidney disease: Secondary | ICD-10-CM | POA: Diagnosis not present

## 2018-03-29 DIAGNOSIS — Z7982 Long term (current) use of aspirin: Secondary | ICD-10-CM | POA: Diagnosis not present

## 2018-03-29 DIAGNOSIS — Z8673 Personal history of transient ischemic attack (TIA), and cerebral infarction without residual deficits: Secondary | ICD-10-CM | POA: Diagnosis not present

## 2018-03-29 DIAGNOSIS — F0390 Unspecified dementia without behavioral disturbance: Secondary | ICD-10-CM | POA: Diagnosis not present

## 2018-03-29 DIAGNOSIS — E1122 Type 2 diabetes mellitus with diabetic chronic kidney disease: Secondary | ICD-10-CM | POA: Diagnosis not present

## 2018-03-29 DIAGNOSIS — E1151 Type 2 diabetes mellitus with diabetic peripheral angiopathy without gangrene: Secondary | ICD-10-CM | POA: Diagnosis not present

## 2018-03-29 DIAGNOSIS — K219 Gastro-esophageal reflux disease without esophagitis: Secondary | ICD-10-CM | POA: Diagnosis not present

## 2018-03-29 DIAGNOSIS — K819 Cholecystitis, unspecified: Secondary | ICD-10-CM | POA: Diagnosis not present

## 2018-03-29 DIAGNOSIS — Z4803 Encounter for change or removal of drains: Secondary | ICD-10-CM | POA: Diagnosis not present

## 2018-03-29 DIAGNOSIS — Z8546 Personal history of malignant neoplasm of prostate: Secondary | ICD-10-CM | POA: Diagnosis not present

## 2018-03-29 DIAGNOSIS — R296 Repeated falls: Secondary | ICD-10-CM | POA: Diagnosis not present

## 2018-03-29 DIAGNOSIS — E785 Hyperlipidemia, unspecified: Secondary | ICD-10-CM | POA: Diagnosis not present

## 2018-03-29 DIAGNOSIS — N182 Chronic kidney disease, stage 2 (mild): Secondary | ICD-10-CM | POA: Diagnosis not present

## 2018-03-29 DIAGNOSIS — Z794 Long term (current) use of insulin: Secondary | ICD-10-CM | POA: Diagnosis not present

## 2018-03-29 DIAGNOSIS — Z89421 Acquired absence of other right toe(s): Secondary | ICD-10-CM | POA: Diagnosis not present

## 2018-03-31 DIAGNOSIS — I129 Hypertensive chronic kidney disease with stage 1 through stage 4 chronic kidney disease, or unspecified chronic kidney disease: Secondary | ICD-10-CM | POA: Diagnosis not present

## 2018-03-31 DIAGNOSIS — I2510 Atherosclerotic heart disease of native coronary artery without angina pectoris: Secondary | ICD-10-CM | POA: Diagnosis not present

## 2018-03-31 DIAGNOSIS — Z4803 Encounter for change or removal of drains: Secondary | ICD-10-CM | POA: Diagnosis not present

## 2018-03-31 DIAGNOSIS — I214 Non-ST elevation (NSTEMI) myocardial infarction: Secondary | ICD-10-CM | POA: Diagnosis not present

## 2018-03-31 DIAGNOSIS — K819 Cholecystitis, unspecified: Secondary | ICD-10-CM | POA: Diagnosis not present

## 2018-03-31 DIAGNOSIS — E1122 Type 2 diabetes mellitus with diabetic chronic kidney disease: Secondary | ICD-10-CM | POA: Diagnosis not present

## 2018-04-01 DIAGNOSIS — K819 Cholecystitis, unspecified: Secondary | ICD-10-CM | POA: Diagnosis not present

## 2018-04-01 DIAGNOSIS — I214 Non-ST elevation (NSTEMI) myocardial infarction: Secondary | ICD-10-CM | POA: Diagnosis not present

## 2018-04-01 DIAGNOSIS — Z4803 Encounter for change or removal of drains: Secondary | ICD-10-CM | POA: Diagnosis not present

## 2018-04-01 DIAGNOSIS — E1122 Type 2 diabetes mellitus with diabetic chronic kidney disease: Secondary | ICD-10-CM | POA: Diagnosis not present

## 2018-04-01 DIAGNOSIS — I129 Hypertensive chronic kidney disease with stage 1 through stage 4 chronic kidney disease, or unspecified chronic kidney disease: Secondary | ICD-10-CM | POA: Diagnosis not present

## 2018-04-01 DIAGNOSIS — I2510 Atherosclerotic heart disease of native coronary artery without angina pectoris: Secondary | ICD-10-CM | POA: Diagnosis not present

## 2018-04-02 DIAGNOSIS — E1122 Type 2 diabetes mellitus with diabetic chronic kidney disease: Secondary | ICD-10-CM | POA: Diagnosis not present

## 2018-04-02 DIAGNOSIS — Z4803 Encounter for change or removal of drains: Secondary | ICD-10-CM | POA: Diagnosis not present

## 2018-04-02 DIAGNOSIS — I129 Hypertensive chronic kidney disease with stage 1 through stage 4 chronic kidney disease, or unspecified chronic kidney disease: Secondary | ICD-10-CM | POA: Diagnosis not present

## 2018-04-02 DIAGNOSIS — I2510 Atherosclerotic heart disease of native coronary artery without angina pectoris: Secondary | ICD-10-CM | POA: Diagnosis not present

## 2018-04-02 DIAGNOSIS — I214 Non-ST elevation (NSTEMI) myocardial infarction: Secondary | ICD-10-CM | POA: Diagnosis not present

## 2018-04-02 DIAGNOSIS — K819 Cholecystitis, unspecified: Secondary | ICD-10-CM | POA: Diagnosis not present

## 2018-04-03 DIAGNOSIS — Z4803 Encounter for change or removal of drains: Secondary | ICD-10-CM | POA: Diagnosis not present

## 2018-04-03 DIAGNOSIS — K819 Cholecystitis, unspecified: Secondary | ICD-10-CM | POA: Diagnosis not present

## 2018-04-03 DIAGNOSIS — I129 Hypertensive chronic kidney disease with stage 1 through stage 4 chronic kidney disease, or unspecified chronic kidney disease: Secondary | ICD-10-CM | POA: Diagnosis not present

## 2018-04-03 DIAGNOSIS — E1122 Type 2 diabetes mellitus with diabetic chronic kidney disease: Secondary | ICD-10-CM | POA: Diagnosis not present

## 2018-04-03 DIAGNOSIS — I214 Non-ST elevation (NSTEMI) myocardial infarction: Secondary | ICD-10-CM | POA: Diagnosis not present

## 2018-04-03 DIAGNOSIS — I2510 Atherosclerotic heart disease of native coronary artery without angina pectoris: Secondary | ICD-10-CM | POA: Diagnosis not present

## 2018-04-04 DIAGNOSIS — M79676 Pain in unspecified toe(s): Secondary | ICD-10-CM | POA: Diagnosis not present

## 2018-04-04 DIAGNOSIS — M2010 Hallux valgus (acquired), unspecified foot: Secondary | ICD-10-CM | POA: Diagnosis not present

## 2018-04-04 DIAGNOSIS — L97502 Non-pressure chronic ulcer of other part of unspecified foot with fat layer exposed: Secondary | ICD-10-CM | POA: Diagnosis not present

## 2018-04-04 DIAGNOSIS — I739 Peripheral vascular disease, unspecified: Secondary | ICD-10-CM | POA: Diagnosis not present

## 2018-04-04 DIAGNOSIS — E1151 Type 2 diabetes mellitus with diabetic peripheral angiopathy without gangrene: Secondary | ICD-10-CM | POA: Diagnosis not present

## 2018-04-04 DIAGNOSIS — S90851A Superficial foreign body, right foot, initial encounter: Secondary | ICD-10-CM | POA: Diagnosis not present

## 2018-04-04 DIAGNOSIS — I70203 Unspecified atherosclerosis of native arteries of extremities, bilateral legs: Secondary | ICD-10-CM | POA: Diagnosis not present

## 2018-04-06 DIAGNOSIS — K819 Cholecystitis, unspecified: Secondary | ICD-10-CM | POA: Diagnosis not present

## 2018-04-06 DIAGNOSIS — I129 Hypertensive chronic kidney disease with stage 1 through stage 4 chronic kidney disease, or unspecified chronic kidney disease: Secondary | ICD-10-CM | POA: Diagnosis not present

## 2018-04-06 DIAGNOSIS — E1122 Type 2 diabetes mellitus with diabetic chronic kidney disease: Secondary | ICD-10-CM | POA: Diagnosis not present

## 2018-04-06 DIAGNOSIS — I214 Non-ST elevation (NSTEMI) myocardial infarction: Secondary | ICD-10-CM | POA: Diagnosis not present

## 2018-04-06 DIAGNOSIS — I2510 Atherosclerotic heart disease of native coronary artery without angina pectoris: Secondary | ICD-10-CM | POA: Diagnosis not present

## 2018-04-06 DIAGNOSIS — Z4803 Encounter for change or removal of drains: Secondary | ICD-10-CM | POA: Diagnosis not present

## 2018-04-06 DIAGNOSIS — C61 Malignant neoplasm of prostate: Secondary | ICD-10-CM | POA: Diagnosis not present

## 2018-04-06 DIAGNOSIS — Z89429 Acquired absence of other toe(s), unspecified side: Secondary | ICD-10-CM | POA: Diagnosis not present

## 2018-04-06 DIAGNOSIS — G1229 Other motor neuron disease: Secondary | ICD-10-CM | POA: Diagnosis not present

## 2018-04-06 DIAGNOSIS — F320 Major depressive disorder, single episode, mild: Secondary | ICD-10-CM | POA: Diagnosis not present

## 2018-04-09 DIAGNOSIS — I2510 Atherosclerotic heart disease of native coronary artery without angina pectoris: Secondary | ICD-10-CM | POA: Diagnosis not present

## 2018-04-09 DIAGNOSIS — I214 Non-ST elevation (NSTEMI) myocardial infarction: Secondary | ICD-10-CM | POA: Diagnosis not present

## 2018-04-09 DIAGNOSIS — K819 Cholecystitis, unspecified: Secondary | ICD-10-CM | POA: Diagnosis not present

## 2018-04-09 DIAGNOSIS — Z4803 Encounter for change or removal of drains: Secondary | ICD-10-CM | POA: Diagnosis not present

## 2018-04-09 DIAGNOSIS — E1122 Type 2 diabetes mellitus with diabetic chronic kidney disease: Secondary | ICD-10-CM | POA: Diagnosis not present

## 2018-04-09 DIAGNOSIS — I129 Hypertensive chronic kidney disease with stage 1 through stage 4 chronic kidney disease, or unspecified chronic kidney disease: Secondary | ICD-10-CM | POA: Diagnosis not present

## 2018-04-10 DIAGNOSIS — I2510 Atherosclerotic heart disease of native coronary artery without angina pectoris: Secondary | ICD-10-CM | POA: Diagnosis not present

## 2018-04-10 DIAGNOSIS — I214 Non-ST elevation (NSTEMI) myocardial infarction: Secondary | ICD-10-CM | POA: Diagnosis not present

## 2018-04-10 DIAGNOSIS — I129 Hypertensive chronic kidney disease with stage 1 through stage 4 chronic kidney disease, or unspecified chronic kidney disease: Secondary | ICD-10-CM | POA: Diagnosis not present

## 2018-04-10 DIAGNOSIS — I222 Subsequent non-ST elevation (NSTEMI) myocardial infarction: Secondary | ICD-10-CM | POA: Diagnosis not present

## 2018-04-10 DIAGNOSIS — K819 Cholecystitis, unspecified: Secondary | ICD-10-CM | POA: Diagnosis not present

## 2018-04-10 DIAGNOSIS — I739 Peripheral vascular disease, unspecified: Secondary | ICD-10-CM | POA: Diagnosis not present

## 2018-04-10 DIAGNOSIS — Z4803 Encounter for change or removal of drains: Secondary | ICD-10-CM | POA: Diagnosis not present

## 2018-04-10 DIAGNOSIS — Z9861 Coronary angioplasty status: Secondary | ICD-10-CM | POA: Diagnosis not present

## 2018-04-10 DIAGNOSIS — I10 Essential (primary) hypertension: Secondary | ICD-10-CM | POA: Diagnosis not present

## 2018-04-10 DIAGNOSIS — E1122 Type 2 diabetes mellitus with diabetic chronic kidney disease: Secondary | ICD-10-CM | POA: Diagnosis not present

## 2018-04-13 DIAGNOSIS — I2510 Atherosclerotic heart disease of native coronary artery without angina pectoris: Secondary | ICD-10-CM | POA: Diagnosis not present

## 2018-04-13 DIAGNOSIS — I129 Hypertensive chronic kidney disease with stage 1 through stage 4 chronic kidney disease, or unspecified chronic kidney disease: Secondary | ICD-10-CM | POA: Diagnosis not present

## 2018-04-13 DIAGNOSIS — E1122 Type 2 diabetes mellitus with diabetic chronic kidney disease: Secondary | ICD-10-CM | POA: Diagnosis not present

## 2018-04-13 DIAGNOSIS — Z4803 Encounter for change or removal of drains: Secondary | ICD-10-CM | POA: Diagnosis not present

## 2018-04-13 DIAGNOSIS — K819 Cholecystitis, unspecified: Secondary | ICD-10-CM | POA: Diagnosis not present

## 2018-04-13 DIAGNOSIS — I214 Non-ST elevation (NSTEMI) myocardial infarction: Secondary | ICD-10-CM | POA: Diagnosis not present

## 2018-04-16 DIAGNOSIS — I129 Hypertensive chronic kidney disease with stage 1 through stage 4 chronic kidney disease, or unspecified chronic kidney disease: Secondary | ICD-10-CM | POA: Diagnosis not present

## 2018-04-16 DIAGNOSIS — Z4803 Encounter for change or removal of drains: Secondary | ICD-10-CM | POA: Diagnosis not present

## 2018-04-16 DIAGNOSIS — I214 Non-ST elevation (NSTEMI) myocardial infarction: Secondary | ICD-10-CM | POA: Diagnosis not present

## 2018-04-16 DIAGNOSIS — K819 Cholecystitis, unspecified: Secondary | ICD-10-CM | POA: Diagnosis not present

## 2018-04-16 DIAGNOSIS — I2510 Atherosclerotic heart disease of native coronary artery without angina pectoris: Secondary | ICD-10-CM | POA: Diagnosis not present

## 2018-04-16 DIAGNOSIS — E1122 Type 2 diabetes mellitus with diabetic chronic kidney disease: Secondary | ICD-10-CM | POA: Diagnosis not present

## 2018-04-17 DIAGNOSIS — Z4803 Encounter for change or removal of drains: Secondary | ICD-10-CM | POA: Diagnosis not present

## 2018-04-17 DIAGNOSIS — I129 Hypertensive chronic kidney disease with stage 1 through stage 4 chronic kidney disease, or unspecified chronic kidney disease: Secondary | ICD-10-CM | POA: Diagnosis not present

## 2018-04-17 DIAGNOSIS — K819 Cholecystitis, unspecified: Secondary | ICD-10-CM | POA: Diagnosis not present

## 2018-04-17 DIAGNOSIS — I214 Non-ST elevation (NSTEMI) myocardial infarction: Secondary | ICD-10-CM | POA: Diagnosis not present

## 2018-04-17 DIAGNOSIS — E1122 Type 2 diabetes mellitus with diabetic chronic kidney disease: Secondary | ICD-10-CM | POA: Diagnosis not present

## 2018-04-17 DIAGNOSIS — I2510 Atherosclerotic heart disease of native coronary artery without angina pectoris: Secondary | ICD-10-CM | POA: Diagnosis not present

## 2018-04-20 DIAGNOSIS — Z4803 Encounter for change or removal of drains: Secondary | ICD-10-CM | POA: Diagnosis not present

## 2018-04-20 DIAGNOSIS — I129 Hypertensive chronic kidney disease with stage 1 through stage 4 chronic kidney disease, or unspecified chronic kidney disease: Secondary | ICD-10-CM | POA: Diagnosis not present

## 2018-04-20 DIAGNOSIS — K819 Cholecystitis, unspecified: Secondary | ICD-10-CM | POA: Diagnosis not present

## 2018-04-20 DIAGNOSIS — E1122 Type 2 diabetes mellitus with diabetic chronic kidney disease: Secondary | ICD-10-CM | POA: Diagnosis not present

## 2018-04-20 DIAGNOSIS — I214 Non-ST elevation (NSTEMI) myocardial infarction: Secondary | ICD-10-CM | POA: Diagnosis not present

## 2018-04-20 DIAGNOSIS — I2510 Atherosclerotic heart disease of native coronary artery without angina pectoris: Secondary | ICD-10-CM | POA: Diagnosis not present

## 2018-04-21 DIAGNOSIS — K8020 Calculus of gallbladder without cholecystitis without obstruction: Secondary | ICD-10-CM | POA: Diagnosis not present

## 2018-04-22 DIAGNOSIS — I222 Subsequent non-ST elevation (NSTEMI) myocardial infarction: Secondary | ICD-10-CM | POA: Diagnosis not present

## 2018-04-27 DIAGNOSIS — I2510 Atherosclerotic heart disease of native coronary artery without angina pectoris: Secondary | ICD-10-CM | POA: Diagnosis not present

## 2018-04-27 DIAGNOSIS — I739 Peripheral vascular disease, unspecified: Secondary | ICD-10-CM | POA: Diagnosis not present

## 2018-04-27 DIAGNOSIS — Z9861 Coronary angioplasty status: Secondary | ICD-10-CM | POA: Diagnosis not present

## 2018-04-27 DIAGNOSIS — I10 Essential (primary) hypertension: Secondary | ICD-10-CM | POA: Diagnosis not present

## 2018-04-27 DIAGNOSIS — I222 Subsequent non-ST elevation (NSTEMI) myocardial infarction: Secondary | ICD-10-CM | POA: Diagnosis not present

## 2018-04-28 DIAGNOSIS — K589 Irritable bowel syndrome without diarrhea: Secondary | ICD-10-CM | POA: Diagnosis not present

## 2018-04-28 DIAGNOSIS — E1151 Type 2 diabetes mellitus with diabetic peripheral angiopathy without gangrene: Secondary | ICD-10-CM | POA: Diagnosis not present

## 2018-04-28 DIAGNOSIS — K219 Gastro-esophageal reflux disease without esophagitis: Secondary | ICD-10-CM | POA: Diagnosis not present

## 2018-04-28 DIAGNOSIS — Z8673 Personal history of transient ischemic attack (TIA), and cerebral infarction without residual deficits: Secondary | ICD-10-CM | POA: Diagnosis not present

## 2018-04-28 DIAGNOSIS — E1122 Type 2 diabetes mellitus with diabetic chronic kidney disease: Secondary | ICD-10-CM | POA: Diagnosis not present

## 2018-04-28 DIAGNOSIS — M109 Gout, unspecified: Secondary | ICD-10-CM | POA: Diagnosis not present

## 2018-04-28 DIAGNOSIS — R296 Repeated falls: Secondary | ICD-10-CM | POA: Diagnosis not present

## 2018-04-28 DIAGNOSIS — N182 Chronic kidney disease, stage 2 (mild): Secondary | ICD-10-CM | POA: Diagnosis not present

## 2018-04-28 DIAGNOSIS — K819 Cholecystitis, unspecified: Secondary | ICD-10-CM | POA: Diagnosis not present

## 2018-04-28 DIAGNOSIS — I2510 Atherosclerotic heart disease of native coronary artery without angina pectoris: Secondary | ICD-10-CM | POA: Diagnosis not present

## 2018-04-28 DIAGNOSIS — Z794 Long term (current) use of insulin: Secondary | ICD-10-CM | POA: Diagnosis not present

## 2018-04-28 DIAGNOSIS — Z7902 Long term (current) use of antithrombotics/antiplatelets: Secondary | ICD-10-CM | POA: Diagnosis not present

## 2018-04-28 DIAGNOSIS — E785 Hyperlipidemia, unspecified: Secondary | ICD-10-CM | POA: Diagnosis not present

## 2018-04-28 DIAGNOSIS — I129 Hypertensive chronic kidney disease with stage 1 through stage 4 chronic kidney disease, or unspecified chronic kidney disease: Secondary | ICD-10-CM | POA: Diagnosis not present

## 2018-04-28 DIAGNOSIS — Z7982 Long term (current) use of aspirin: Secondary | ICD-10-CM | POA: Diagnosis not present

## 2018-04-28 DIAGNOSIS — Z8546 Personal history of malignant neoplasm of prostate: Secondary | ICD-10-CM | POA: Diagnosis not present

## 2018-04-28 DIAGNOSIS — E1140 Type 2 diabetes mellitus with diabetic neuropathy, unspecified: Secondary | ICD-10-CM | POA: Diagnosis not present

## 2018-04-28 DIAGNOSIS — Z4803 Encounter for change or removal of drains: Secondary | ICD-10-CM | POA: Diagnosis not present

## 2018-04-28 DIAGNOSIS — I214 Non-ST elevation (NSTEMI) myocardial infarction: Secondary | ICD-10-CM | POA: Diagnosis not present

## 2018-04-28 DIAGNOSIS — Z89421 Acquired absence of other right toe(s): Secondary | ICD-10-CM | POA: Diagnosis not present

## 2018-04-28 DIAGNOSIS — F0390 Unspecified dementia without behavioral disturbance: Secondary | ICD-10-CM | POA: Diagnosis not present

## 2018-04-28 DIAGNOSIS — Z95820 Peripheral vascular angioplasty status with implants and grafts: Secondary | ICD-10-CM | POA: Diagnosis not present

## 2018-04-28 DIAGNOSIS — G4733 Obstructive sleep apnea (adult) (pediatric): Secondary | ICD-10-CM | POA: Diagnosis not present

## 2018-05-05 DIAGNOSIS — E1122 Type 2 diabetes mellitus with diabetic chronic kidney disease: Secondary | ICD-10-CM | POA: Diagnosis not present

## 2018-05-05 DIAGNOSIS — K819 Cholecystitis, unspecified: Secondary | ICD-10-CM | POA: Diagnosis not present

## 2018-05-05 DIAGNOSIS — I214 Non-ST elevation (NSTEMI) myocardial infarction: Secondary | ICD-10-CM | POA: Diagnosis not present

## 2018-05-05 DIAGNOSIS — I129 Hypertensive chronic kidney disease with stage 1 through stage 4 chronic kidney disease, or unspecified chronic kidney disease: Secondary | ICD-10-CM | POA: Diagnosis not present

## 2018-05-05 DIAGNOSIS — Z4803 Encounter for change or removal of drains: Secondary | ICD-10-CM | POA: Diagnosis not present

## 2018-05-05 DIAGNOSIS — I2510 Atherosclerotic heart disease of native coronary artery without angina pectoris: Secondary | ICD-10-CM | POA: Diagnosis not present

## 2018-05-05 DIAGNOSIS — G4733 Obstructive sleep apnea (adult) (pediatric): Secondary | ICD-10-CM | POA: Diagnosis not present

## 2018-05-06 DIAGNOSIS — H6121 Impacted cerumen, right ear: Secondary | ICD-10-CM | POA: Diagnosis not present

## 2018-05-06 DIAGNOSIS — I70261 Atherosclerosis of native arteries of extremities with gangrene, right leg: Secondary | ICD-10-CM | POA: Diagnosis not present

## 2018-05-06 DIAGNOSIS — E611 Iron deficiency: Secondary | ICD-10-CM | POA: Diagnosis not present

## 2018-06-05 ENCOUNTER — Emergency Department
Admission: EM | Admit: 2018-06-05 | Discharge: 2018-06-05 | Disposition: A | Discharge status: Home / Self Care | Payer: Medicare Other | Attending: Emergency Medicine | Admitting: Emergency Medicine

## 2018-06-05 ENCOUNTER — Other Ambulatory Visit: Payer: Self-pay

## 2018-06-05 ENCOUNTER — Encounter: Payer: Self-pay | Admitting: Emergency Medicine

## 2018-06-05 DIAGNOSIS — I10 Essential (primary) hypertension: Secondary | ICD-10-CM | POA: Insufficient documentation

## 2018-06-05 DIAGNOSIS — E785 Hyperlipidemia, unspecified: Secondary | ICD-10-CM | POA: Insufficient documentation

## 2018-06-05 DIAGNOSIS — K8000 Calculus of gallbladder with acute cholecystitis without obstruction: Secondary | ICD-10-CM | POA: Diagnosis not present

## 2018-06-05 LAB — TYPE AND SCREEN
ABO/RH(D): O POS
Antibody Screen: NEGATIVE

## 2018-06-05 LAB — CBC WITH DIFFERENTIAL/PLATELET
Abs Immature Granulocytes: 0.01 10*3/uL (ref 0.00–0.07)
Basophils Absolute: 0 10*3/uL (ref 0.0–0.1)
Basophils Relative: 1 %
Eosinophils Absolute: 0.1 10*3/uL (ref 0.0–0.5)
Eosinophils Relative: 2 %
HCT: 33.6 % — ABNORMAL LOW (ref 39.0–52.0)
Hemoglobin: 10.9 g/dL — ABNORMAL LOW (ref 13.0–17.0)
Immature Granulocytes: 0 %
Lymphocytes Relative: 30 %
Lymphs Abs: 2.2 10*3/uL (ref 0.7–4.0)
MCH: 27 pg (ref 26.0–34.0)
MCHC: 32.4 g/dL (ref 30.0–36.0)
MCV: 83.4 fL (ref 80.0–100.0)
Monocytes Absolute: 0.5 10*3/uL (ref 0.1–1.0)
Monocytes Relative: 7 %
Neutro Abs: 4.6 10*3/uL (ref 1.7–7.7)
Neutrophils Relative %: 60 %
Platelets: 301 10*3/uL (ref 150–400)
RBC: 4.03 MIL/uL — ABNORMAL LOW (ref 4.22–5.81)
RDW: 14.6 % (ref 11.5–15.5)
WBC: 7.5 10*3/uL (ref 4.0–10.5)
nRBC: 0 % (ref 0.0–0.2)

## 2018-06-05 LAB — COMPREHENSIVE METABOLIC PANEL
ALT: 21 U/L (ref 0–44)
AST: 28 U/L (ref 15–41)
Albumin: 3.8 g/dL (ref 3.5–5.0)
Alkaline Phosphatase: 97 U/L (ref 38–126)
Anion gap: 10 (ref 5–15)
BUN: 29 mg/dL — ABNORMAL HIGH (ref 8–23)
CO2: 25 mmol/L (ref 22–32)
Calcium: 8.8 mg/dL — ABNORMAL LOW (ref 8.9–10.3)
Chloride: 106 mmol/L (ref 98–111)
Creatinine, Ser: 1.17 mg/dL (ref 0.61–1.24)
GFR calc Af Amer: >60 mL/min (ref >60)
GFR calc non Af Amer: 56 mL/min — ABNORMAL LOW (ref >60)
Glucose, Bld: 210 mg/dL — ABNORMAL HIGH (ref 70–99)
Potassium: 3.6 mmol/L (ref 3.5–5.1)
Sodium: 141 mmol/L (ref 135–145)
Total Bilirubin: 0.4 mg/dL (ref 0.3–1.2)
Total Protein: 6.9 g/dL (ref 6.5–8.1)

## 2018-06-05 LAB — LIPASE, BLOOD: Lipase: 48 U/L (ref 11–51)
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
## 2018-06-05 NOTE — ED Notes (Signed)
REPORT GIVEN TO [facility]

## 2018-06-05 NOTE — ED Notes (Signed)
REPORT GIVEN TO [facility]

## 2018-06-05 NOTE — ED Notes (Signed)
As per patient moved from [facility],  Has been here 3 weeks and was told by his surgeon in [facility] to go to ed for referral so that he may have his "drainage tubes removed" Patient arrives with Glennon Mac prot drainage bag to right side abd.  Denies pain at site. Denies s/s of infection. Md to assess and plan care.

## 2018-06-05 NOTE — ED Notes (Signed)
PATIENT TO BE DISCHARGED TO HOME WITH FOLLOW UP INSTRUCTIONS.

## 2018-06-05 NOTE — Discharge Instructions (Addendum)
You are being referred to the General Surgeon (Dr. Tama High) for further management of your gallbladder. Continue to take your home meds as prescribed. You should also work to establish a primary care provider at the same time. Return to the ED for any fevers, abdominal pain, or chest pain.

## 2018-06-05 NOTE — ED Notes (Signed)
PA at bedside to obtain consent for medical records. IV access placed. Labs drawn and sent.

## 2018-06-05 NOTE — ED Provider Notes (Signed)
-----------------------------------------   5:39 PM on 06/05/2018 -----------------------------------------  Patient from [place] where he had a drain placed in February, we have been able to get those records from your state, please see PA note for further details.  He has had the drain in place for several months and cannot get it pulled in [place] so he is here to see if we can get it pulled out for him.  No fever no abdominal pain no vomiting no white count he is not had any significant drainage from it for some time.  He feels fine.  He just cannot get to his neurosurgeons because of the coronavirus.  Abdomen is completely benign he is awake and alert and looks well, work on check basic blood work and see if we can get him to see GEN surge possibly as an outpatient based on what we find.   Schuyler Amor, MD 06/05/18 1740

## 2018-06-05 NOTE — ED Triage Notes (Signed)
Pt to ED via POV. Pt states that the has a drain in his gallbladder, pt states that it should have been removed about 6 weeks ago but the doctors "back home" wouldn't touch it and told him when he moved to [place] someone would remove it. Pt is in NAD at this time.

## 2018-06-11 ENCOUNTER — Other Ambulatory Visit: Payer: Self-pay

## 2018-06-11 ENCOUNTER — Encounter: Payer: Self-pay | Admitting: Surgery

## 2018-06-11 ENCOUNTER — Ambulatory Visit (INDEPENDENT_AMBULATORY_CARE_PROVIDER_SITE_OTHER): Payer: Medicare Other | Admitting: Surgery

## 2018-06-11 VITALS — BP 162/66 | HR 54 | Temp 97.9°F | Ht 68.0 in | Wt 165.8 lb

## 2018-06-11 DIAGNOSIS — K810 Acute cholecystitis: Secondary | ICD-10-CM

## 2018-06-11 NOTE — Patient Instructions (Addendum)
We have scheduled you to go to [facility] for Monday. You will arrive by 11 am and will call their office at [phone]. They will come out and get your vital signs. Please bring your medications and medical records.  You will have a telephone/virtual visit that day at 2:20 pm with Dr Einar Pheasant.   We will schedule you for a Cholangiogram and will call you with that appointment.
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
## 2018-06-12 ENCOUNTER — Telehealth: Payer: Self-pay | Admitting: *Deleted

## 2018-06-12 NOTE — Telephone Encounter (Signed)
I did call and speak with patient's wife, Vaughan Basta, today.   Patient has been scheduled for a cholangiogram (just pictures) for 06-18-18 at [facility]. Arrive at 9:15 am at the [facility]. Prep: none. Also, notified of the no visitors policy due to [id number] restrictions.   Wife verbalizes understanding of the above.

## 2018-06-15 ENCOUNTER — Ambulatory Visit (INDEPENDENT_AMBULATORY_CARE_PROVIDER_SITE_OTHER): Payer: Medicare Other | Admitting: Family Medicine

## 2018-06-15 ENCOUNTER — Encounter: Payer: Self-pay | Admitting: Family Medicine

## 2018-06-15 ENCOUNTER — Other Ambulatory Visit: Payer: Self-pay

## 2018-06-15 ENCOUNTER — Ambulatory Visit: Payer: Medicare Other

## 2018-06-15 VITALS — BP 138/50 | HR 64 | Temp 98.9°F

## 2018-06-15 DIAGNOSIS — Z434 Encounter for attention to other artificial openings of digestive tract: Secondary | ICD-10-CM

## 2018-06-15 DIAGNOSIS — I252 Old myocardial infarction: Secondary | ICD-10-CM | POA: Insufficient documentation

## 2018-06-15 DIAGNOSIS — M545 Low back pain, unspecified: Secondary | ICD-10-CM | POA: Insufficient documentation

## 2018-06-15 DIAGNOSIS — I10 Essential (primary) hypertension: Secondary | ICD-10-CM

## 2018-06-15 DIAGNOSIS — N183 Chronic kidney disease, stage 3 unspecified: Secondary | ICD-10-CM

## 2018-06-15 DIAGNOSIS — E1122 Type 2 diabetes mellitus with diabetic chronic kidney disease: Secondary | ICD-10-CM | POA: Diagnosis not present

## 2018-06-15 DIAGNOSIS — Z8546 Personal history of malignant neoplasm of prostate: Secondary | ICD-10-CM

## 2018-06-15 DIAGNOSIS — I739 Peripheral vascular disease, unspecified: Secondary | ICD-10-CM | POA: Diagnosis not present

## 2018-06-15 DIAGNOSIS — Z89421 Acquired absence of other right toe(s): Secondary | ICD-10-CM | POA: Diagnosis not present

## 2018-06-15 DIAGNOSIS — G8929 Other chronic pain: Secondary | ICD-10-CM | POA: Diagnosis not present

## 2018-06-15 DIAGNOSIS — Z794 Long term (current) use of insulin: Secondary | ICD-10-CM

## 2018-06-15 MED ORDER — HYOSCYAMINE SULFATE 0.125 MG PO TBDP: 0.3750 mg | ORAL_TABLET | Freq: Two times a day (BID) | ORAL | 3 refills | Status: DC

## 2018-06-15 NOTE — Assessment & Plan Note (Signed)
Uses ibuprofen and heat occasionally. Does not want surgery and has not tried acupuncture. He will try acupuncture and let me know if he wants a referral to pain, though not sure if this would be helpful given prior failed injections

## 2018-06-15 NOTE — Assessment & Plan Note (Signed)
PSA elevated on last check, but told no need to follow-up. Will plan for annual PSA monitoring

## 2018-06-15 NOTE — Assessment & Plan Note (Signed)
Last Hgb A1c 7.5% per patient, which is acceptable control given age. Does not follow diet.

## 2018-06-15 NOTE — Assessment & Plan Note (Signed)
Evaluated by surgery, needs additional work-up and cardiology evaluation prior to definitive treatment. Drain placed due to cardiac risks at initial presentation. Overall feels he is improving - less output from drain

## 2018-06-15 NOTE — Assessment & Plan Note (Signed)
Per patient February 2020 with 2 stents placed

## 2018-06-15 NOTE — Progress Notes (Signed)
I connected with Peter [name] on 06/15/18 at  2:20 PM EDT by video and verified that I am speaking with the correct person using two identifiers.   I discussed the limitations, risks, security and privacy concerns of performing an evaluation and management service by video and the availability of in person appointments. I also discussed with the patient that there may be a patient responsible charge related to this service. The patient expressed understanding and agreed to proceed.  Patient location: [facility] Provider Location: [place] Participants: Peter [name] and Peter [name]   Subjective:     Peter [name] is a 85 y.o. male presenting for Establish Care (previous PCP in [place] Peter [name].); Medication Refill (Hyoscamine to be filled); and Referral (needs a local cardiologist for heart disease. Previous was Peter [name])     HPI   #Back Pain - hinders him from doing what he wants - 2.5 years  - has done chiropractor, pain management, ortho, w/o improvement - did have an MRI and was told he had arthritis - cortisone shots - though last shot was not helpful - has tried physical therapy - no improvement - takes ibuprofen prn - sometimes will take muscle pain rub - heat patches w/o improvement  #Prostate cancer - 2010 - was getting annual prostate testing from primary - has been increasing recently  - last one was 10 > went to a cancer doctor and he said not to worry about it - was checked 3-4 months  #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   Cardiovascular: Negative for chest pain.  Gastrointestinal: Negative for abdominal pain.  Musculoskeletal: Positive for back  pain and gait problem.     Social History   Tobacco Use  Smoking Status Former Smoker  . Packs/day: 0.75  . Years: 12.00  . Pack years: 9.00  . Types: Cigars, Cigarettes  . Last attempt to quit: 1970  . Years since quitting: 50.3  Smokeless Tobacco Former Systems developer  . Types: Chew  . Quit date: 2016        Objective:   BP Readings from Last 3 Encounters:  06/15/18 (!) 138/50  06/11/18 (!) 162/66  06/05/18 (!) 190/58   Wt Readings from Last 3 Encounters:  06/11/18 165 lb 12.8 oz (75.2 kg)  04/18/14 188 lb 8 oz (85.5 kg)   BP (!) 138/50   Pulse 64   Temp 98.9 F (37.2 C)   SpO2 95%   Physical Exam Constitutional:      Appearance: Normal appearance. He is not ill-appearing.  HENT:     Head: Normocephalic and atraumatic.     Right Ear: External ear normal.     Left Ear: External ear normal.     Nose: Nose normal.  Cardiovascular:     Rate and Rhythm: Normal rate.  Pulmonary:     Effort: Pulmonary effort is normal. No respiratory distress.  Neurological:     Mental Status: He is alert. Mental status is at baseline.     Comments: Occasionally had help from family with timing of PMH  Psychiatric:        Mood and Affect: Mood normal.  Thought Content: Thought content normal.        Judgment: Judgment normal.           Assessment & Plan:   Problem List Items Addressed This Visit      Cardiovascular and Mediastinum   Hypertension    BP overall stable. No changes today. Continue to monitor      Relevant Medications   hydrochlorothiazide (HYDRODIURIL) 12.5 MG tablet   Other Relevant Orders   Ambulatory referral to Cardiology   PVD (peripheral vascular disease) ([place])   Relevant Medications   hydrochlorothiazide (HYDRODIURIL) 12.5 MG tablet   Other Relevant Orders   Ambulatory referral to Cardiology     Endocrine   DM (diabetes mellitus) type II controlled with renal manifestation (HCC)    Last Hgb A1c 7.5% per patient, which is acceptable  control given age. Does not follow diet.         Other   Cholecystostomy care [facility])    Evaluated by surgery, needs additional work-up and cardiology evaluation prior to definitive treatment. Drain placed due to cardiac risks at initial presentation. Overall feels he is improving - less output from drain      Relevant Medications   hyoscyamine (ANASPAZ) 0.125 MG TBDP disintergrating tablet   Hx of myocardial infarction    Per patient February 2020 with 2 stents placed      Relevant Orders   Ambulatory referral to Cardiology   Chronic bilateral low back pain - Primary    Uses ibuprofen and heat occasionally. Does not want surgery and has not tried acupuncture. He will try acupuncture and let me know if he wants a referral to pain, though not sure if this would be helpful given prior failed injections      S/P amputation of lesser toe, right (HCC)   Hx of malignant neoplasm of prostate    PSA elevated on last check, but told no need to follow-up. Will plan for annual PSA monitoring          Return in about 6 months (around 12/16/2018).  Peter Noe, MD

## 2018-06-15 NOTE — Assessment & Plan Note (Signed)
BP overall stable. No changes today. Continue to monitor

## 2018-06-16 ENCOUNTER — Telehealth: Payer: Self-pay

## 2018-06-16 NOTE — Telephone Encounter (Signed)
Virtual Visit Pre-Appointment Phone Call  "(Name), I am calling you today to discuss your upcoming appointment. We are currently trying to limit exposure to the virus that causes COVID-19 by seeing patients at home rather than in the office."  1. "What is the BEST phone number to call the day of the visit?" - include this in appointment notes  2. Do you have or have access to (through a family member/friend) a smartphone with video capability that we can use for your visit?" a. If yes - list this number in appt notes as cell (if different from BEST phone #) and list the appointment type as a VIDEO visit in appointment notes b. If no - list the appointment type as a PHONE visit in appointment notes  3. Confirm consent - "In the setting of the current Covid19 crisis, you are scheduled for a (phone or video) visit with your provider on (date) at (time).  Just as we do with many in-office visits, in order for you to participate in this visit, we must obtain consent.  If you'd like, I can send this to your mychart (if signed up) or email for you to review.  Otherwise, I can obtain your verbal consent now.  All virtual visits are billed to your insurance company just like a normal visit would be.  By agreeing to a virtual visit, we'd like you to understand that the technology does not allow for your provider to perform an examination, and thus may limit your provider's ability to fully assess your condition. If your provider identifies any concerns that need to be evaluated in person, we will make arrangements to do so.  Finally, though the technology is pretty good, we cannot assure that it will always work on either your or our end, and in the setting of a video visit, we may have to convert it to a phone-only visit.  In either situation, we cannot ensure that we have a secure connection.  Are you willing to proceed?" STAFF: Did the patient verbally acknowledge consent to telehealth visit? Document  YES/NO here: yes  4. Advise patient to be prepared - "Two hours prior to your appointment, go ahead and check your blood pressure, pulse, oxygen saturation, and your weight (if you have the equipment to check those) and write them all down. When your visit starts, your provider will ask you for this information. If you have an Apple Watch or Kardia device, please plan to have heart rate information ready on the day of your appointment. Please have a pen and paper handy nearby the day of the visit as well."  5. Give patient instructions for MyChart download to smartphone OR [email or web address] as below if video visit (depending on what platform provider is using)  6. Inform patient they will receive a phone call 15 minutes prior to their appointment time (may be from unknown caller ID) so they should be prepared to answer    TELEPHONE CALL NOTE  Peter [name] has been deemed a candidate for a follow-up tele-health visit to limit community exposure during the Covid-19 pandemic. I spoke with the patient via phone to ensure availability of phone/video source, confirm preferred email & phone number, and discuss instructions and expectations.  I reminded Peter [name] to be prepared with any vital sign and/or heart rhythm information that could potentially be obtained via home monitoring, at the time of his visit. I reminded Peter [name] to expect a phone call prior to his visit.  Clarisse Gouge 06/16/2018 11:44 AM   INSTRUCTIONS FOR DOWNLOADING THE MYCHART APP TO SMARTPHONE  - The patient must first make sure to have activated MyChart and know their login information - If Apple, go to [employer] and type in MyChart in the search bar and download the app. If Android, ask patient to go to [employer] and type in [place] in the search bar and download the app. The app is free but as with any other app downloads, their phone may require them to verify saved payment information or  Apple/Android password.  - The patient will need to then log into the app with their MyChart username and password, and select [facility] as their healthcare provider to link the account. When it is time for your visit, go to the MyChart app, find appointments, and click Begin Video Visit. Be sure to Select Allow for your device to access the Microphone and Camera for your visit. You will then be connected, and your provider will be with you shortly.  **If they have any issues connecting, or need assistance please contact MyChart service desk [phone])**  **If using a computer, in order to ensure the best quality for their visit they will need to use either of the following Internet Browsers: [employer], or Google Chrome**  IF USING DOXIMITY or DOXY.ME - The patient will receive a link just prior to their visit by text.     FULL LENGTH CONSENT FOR TELE-HEALTH VISIT   I hereby voluntarily request, consent and authorize [facility] and its employed or contracted physicians, physician assistants, nurse practitioners or other licensed health care professionals (the Practitioner), to provide me with telemedicine health care services (the Services") as deemed necessary by the treating Practitioner. I acknowledge and consent to receive the Services by the Practitioner via telemedicine. I understand that the telemedicine visit will involve communicating with the Practitioner through live audiovisual communication technology and the disclosure of certain medical information by electronic transmission. I acknowledge that I have been given the opportunity to request an in-person assessment or other available alternative prior to the telemedicine visit and am voluntarily participating in the telemedicine visit.  I understand that I have the right to withhold or withdraw my consent to the use of telemedicine in the course of my care at any time, without affecting my right to future care  or treatment, and that the Practitioner or I may terminate the telemedicine visit at any time. I understand that I have the right to inspect all information obtained and/or recorded in the course of the telemedicine visit and may receive copies of available information for a reasonable fee.  I understand that some of the potential risks of receiving the Services via telemedicine include:   Delay or interruption in medical evaluation due to technological equipment failure or disruption;  Information transmitted may not be sufficient (e.g. poor resolution of images) to allow for appropriate medical decision making by the Practitioner; and/or   In rare instances, security protocols could fail, causing a breach of personal health information.  Furthermore, I acknowledge that it is my responsibility to provide information about my medical history, conditions and care that is complete and accurate to the best of my ability. I acknowledge that Practitioner's advice, recommendations, and/or decision may be based on factors not within their control, such as incomplete or inaccurate data provided by me or distortions of diagnostic images or specimens that may result from electronic transmissions. I understand that the  practice of medicine is not an Chief Strategy Officer and that Practitioner makes no warranties or guarantees regarding treatment outcomes. I acknowledge that I will receive a copy of this consent concurrently upon execution via email to the email address I last provided but may also request a printed copy by calling the office of [facility].    I understand that my insurance will be billed for this visit.   I have read or had this consent read to me.  I understand the contents of this consent, which adequately explains the benefits and risks of the Services being provided via telemedicine.   I have been provided ample opportunity to ask questions regarding this consent and the Services and have had  my questions answered to my satisfaction.  I give my informed consent for the services to be provided through the use of telemedicine in my medical care  By participating in this telemedicine visit I agree to the above.

## 2018-06-18 ENCOUNTER — Ambulatory Visit
Admission: RE | Admit: 2018-06-18 | Discharge: 2018-06-18 | Disposition: A | Discharge status: Home / Self Care | Payer: Medicare Other | Source: Ambulatory Visit | Attending: Surgery | Admitting: Surgery

## 2018-06-18 ENCOUNTER — Other Ambulatory Visit: Payer: Self-pay

## 2018-06-18 DIAGNOSIS — K810 Acute cholecystitis: Secondary | ICD-10-CM | POA: Insufficient documentation

## 2018-06-18 DIAGNOSIS — T85590A Other mechanical complication of bile duct prosthesis, initial encounter: Secondary | ICD-10-CM | POA: Diagnosis not present

## 2018-06-18 IMAGING — RF CATHETER CHOLANGIOGRAM
1 series · 2 of 2 positions shown · non-contrast
Comparison: none

INDICATION: Status post percutaneous cholecystostomy tube placement in [place]
in [REDACTED]. The patient has since moved to [facility] [HOSPITAL]. He
states that since that time he has done well and has now been
referred to Dr. [facility] for surgical opinion. The tube currently is
not draining bile despite still being attached to a suction bulb and
the patient states that it has not drained any liquid bile for at
least a month or a month and a half. He denies any abdominal pain or
fever.

[Series 2: cp_standard · 0.19mm/px · 2 of 2 slices shown]
[im 1/2]
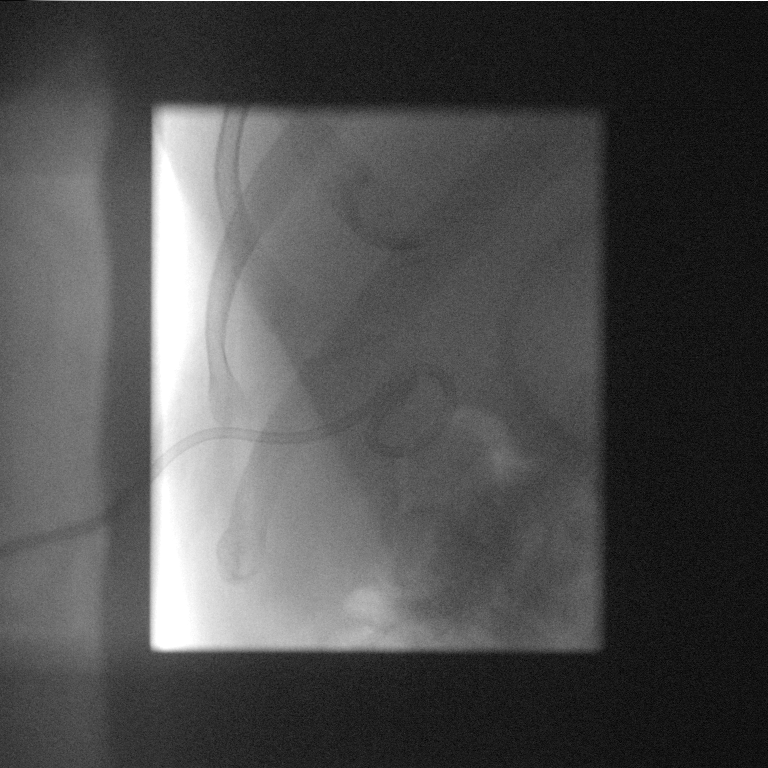
[im 2/2]
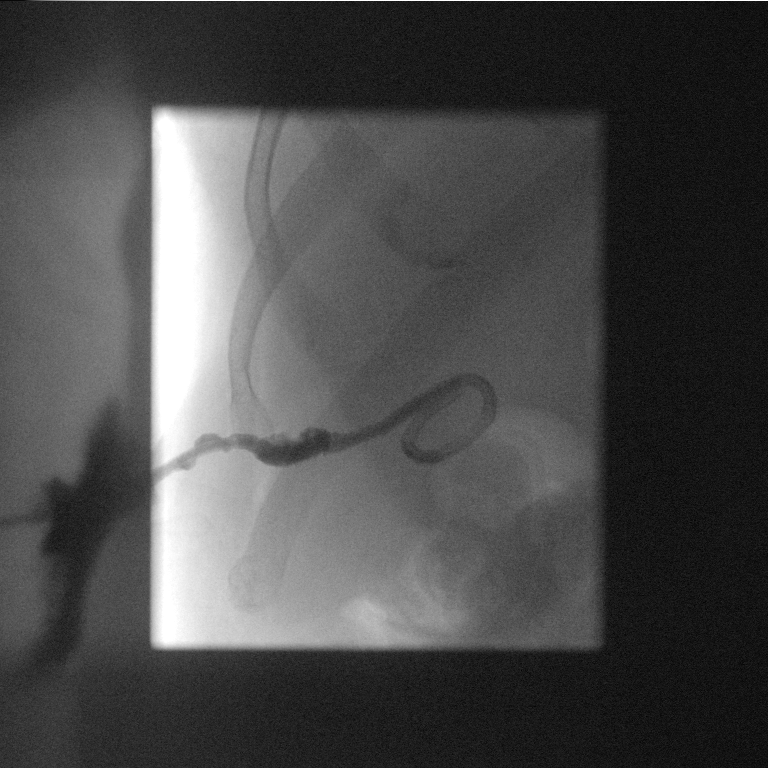

[2 of 2 positions shown; findings below may reference images not displayed]

EXAM:
PERCUTANEOUS CHOLECYSTOSTOMY TUBE INJECTION UNDER FLUOROSCOPY

MEDICATIONS:
None

ANESTHESIA/SEDATION:
None

FLUOROSCOPY TIME:  Fluoroscopy Time: 54 seconds.  13.1 mGy.

CONTRAST:  3 mL Omnipaque 300

COMPLICATIONS:
None immediate.

PROCEDURE:
Fluoroscopy was performed of the indwelling cholecystostomy tube.
Attempt was made to flush the tube with sterile saline. The tube was
then injected with contrast material under fluoroscopy.

The catheter was then cut and removed in its entirety. A dressing
was placed over the tube exit site.
FINDINGS: The pre-existing cholecystostomy tube was completely occluded and
obstructed. After forceful flush, a small amount of contrast
material was able to be injected which fills the tract around the
catheter and exits the skin. The distal portion of the catheter was
never opacified and no contrast was seen to enter the gallbladder or
bile ducts.

Given that there has been no bile output from the tube for
potentially as long as a month and a half and evidence today of tube
obstruction as well as no clinical evidence of active cholecystitis,
decision was made to remove the cholecystostomy tube today. The tube
was removed successfully in its entirety.
IMPRESSION: Occluded cholecystostomy tube with contrast injection only partially
opacifying the catheter and the tube tract. The gallbladder lumen
and bile ducts were not opacified. As the tube has been clinically
obstructed for at least a month to a month and a half and there are
no current signs or symptoms of cholecystitis, decision was made to
remove the tube today rather than try to replace an obstructed
catheter.

## 2018-06-18 MED ORDER — IOPAMIDOL (ISOVUE-300) INJECTION 61%
30.0000 mL | Freq: Once | INTRAVENOUS | Status: AC | PRN
  Administered 2018-06-18: 10:00:00 30 mL

## 2018-06-23 ENCOUNTER — Other Ambulatory Visit: Payer: Self-pay

## 2018-06-23 MED ORDER — "INSULIN SYRINGE-NEEDLE U-100 31G X 5/16"" 1 ML MISC": 5 refills | Status: DC

## 2018-06-26 ENCOUNTER — Ambulatory Visit: Payer: Medicare Other | Admitting: Internal Medicine

## 2018-07-13 ENCOUNTER — Other Ambulatory Visit: Payer: Self-pay

## 2018-07-13 MED ORDER — ALLOPURINOL 100 MG PO TABS: 100.0000 mg | ORAL_TABLET | Freq: Every day | ORAL | 1 refills | Status: DC

## 2018-07-13 MED ORDER — DOXAZOSIN MESYLATE 2 MG PO TABS: 2.0000 mg | ORAL_TABLET | Freq: Every day | ORAL | 1 refills | Status: DC

## 2018-07-13 NOTE — Telephone Encounter (Signed)
Refill request from patient on Allopurinol and Doxazosin. Dr Einar Pheasant has not filled these medications before yet. Sending for review

## 2018-07-21 ENCOUNTER — Telehealth: Payer: Self-pay | Admitting: Family Medicine

## 2018-07-21 DIAGNOSIS — G8929 Other chronic pain: Secondary | ICD-10-CM

## 2018-07-21 DIAGNOSIS — M545 Low back pain, unspecified: Secondary | ICD-10-CM

## 2018-07-21 NOTE — Telephone Encounter (Signed)
Referral to pain management as discussed with patient during last visit.   Can also do physical therapy referral if he would prefer that.

## 2018-07-21 NOTE — Telephone Encounter (Signed)
Patient wife called today requesting a referral be sent to a specialist that could see that patient for back pain.   She stated that at the visit with you the back pain was discussed and they decided that the patient needed to be seen by a specialist .   Patient would like someone in [place].   Patient's Phone- [phone]

## 2018-07-22 NOTE — Telephone Encounter (Signed)
Called patient and gave them the info about [facility], Referral was placed on their [employer].

## 2018-07-22 NOTE — Telephone Encounter (Signed)
Patient left a voicemail stating that he wants a call back regarding his referrals.

## 2018-07-28 ENCOUNTER — Other Ambulatory Visit: Payer: Self-pay | Admitting: *Deleted

## 2018-07-28 MED ORDER — DONEPEZIL HCL 10 MG PO TABS: 10.0000 mg | ORAL_TABLET | Freq: Every day | ORAL | 1 refills | Status: DC

## 2018-07-28 MED ORDER — AMLODIPINE BESYLATE 10 MG PO TABS: 10.0000 mg | ORAL_TABLET | Freq: Every day | ORAL | 1 refills | Status: DC

## 2018-07-28 NOTE — Addendum Note (Signed)
Addended by: Carter Kitten on: 07/28/2018 12:36 PM   Modules accepted: Orders

## 2018-08-03 ENCOUNTER — Telehealth: Payer: Self-pay | Admitting: Family Medicine

## 2018-08-03 NOTE — Telephone Encounter (Signed)
Peter [name]  I'm not for sure who needs to get this complaint about not getting call back from [facility] pain clinic

## 2018-08-03 NOTE — Telephone Encounter (Signed)
Peter [name],  Can you follow up with patient on this?  I assume this was taken care of on our end.    I will also copy Rosaria Ferries and Charmaine on this in case there was some sort of delay on our end.   Thanks.

## 2018-08-03 NOTE — Telephone Encounter (Signed)
Peter [name] (spouse)  Best number [phone] Dr Einar Pheasant referred pt to pain clinic @ [facility].  She stated no one from [facility] pain clinic has called her to schedule this appointment.  Pt has appointment pain management dr in [place] with  Dr Julaine Fusi.

## 2018-08-04 NOTE — Telephone Encounter (Signed)
Called [facility], spoke with Joy, Dr Holley Raring is the only MD accepting New patients. They are not seeing many patients in the office yet. They will process Referral today. Patient notified and understood that he will be called by them.

## 2018-08-04 NOTE — Telephone Encounter (Signed)
Marion or Charmaine if you can speak with patient. I do not know if they were aware about time frame of that office calling them. I am sure it is longer now to hear back. Thank you

## 2018-08-10 ENCOUNTER — Telehealth: Payer: Self-pay

## 2018-08-10 ENCOUNTER — Other Ambulatory Visit: Payer: Self-pay

## 2018-08-10 DIAGNOSIS — I10 Essential (primary) hypertension: Secondary | ICD-10-CM

## 2018-08-10 MED ORDER — HYDROCHLOROTHIAZIDE 12.5 MG PO TABS: 12.5000 mg | ORAL_TABLET | Freq: Every day | ORAL | 1 refills | Status: DC

## 2018-08-10 MED ORDER — LOSARTAN POTASSIUM 100 MG PO TABS: 100.0000 mg | ORAL_TABLET | Freq: Every day | ORAL | 1 refills | Status: DC

## 2018-08-10 MED ORDER — LOSARTAN POTASSIUM 25 MG PO TABS: 100.0000 mg | ORAL_TABLET | Freq: Every day | ORAL | 0 refills | Status: DC

## 2018-08-10 NOTE — Telephone Encounter (Signed)
Received notice from [facility] stating that Losartan 100 mg is on national back order. Losartan 50 mg is also on back order. Pharmacy does have Losartan 25 mg in stock. Please review.

## 2018-08-10 NOTE — Addendum Note (Signed)
Addended by: Lesleigh Noe on: 08/10/2018 04:57 PM   Modules accepted: Orders

## 2018-08-10 NOTE — Telephone Encounter (Signed)
Prescription updated to 25 mg tablet x 4 per day

## 2018-08-11 NOTE — Telephone Encounter (Signed)
Patient advised of the change and the reason for the medication change. Patient understood.

## 2018-08-13 ENCOUNTER — Other Ambulatory Visit: Payer: Self-pay

## 2018-08-13 MED ORDER — SERTRALINE HCL 25 MG PO TABS: 25.0000 mg | ORAL_TABLET | Freq: Every day | ORAL | 1 refills | Status: DC

## 2018-08-13 MED ORDER — MEMANTINE HCL 10 MG PO TABS: 10.0000 mg | ORAL_TABLET | Freq: Two times a day (BID) | ORAL | 1 refills | Status: DC

## 2018-09-01 ENCOUNTER — Encounter (INDEPENDENT_AMBULATORY_CARE_PROVIDER_SITE_OTHER): Payer: Self-pay

## 2018-09-01 ENCOUNTER — Other Ambulatory Visit: Payer: Self-pay

## 2018-09-01 ENCOUNTER — Ambulatory Visit
Admission: RE | Admit: 2018-09-01 | Discharge: 2018-09-01 | Disposition: A | Discharge status: Home / Self Care | Payer: Medicare Other | Source: Ambulatory Visit | Attending: Student in an Organized Health Care Education/Training Program | Admitting: Student in an Organized Health Care Education/Training Program

## 2018-09-01 ENCOUNTER — Encounter: Payer: Self-pay | Admitting: Student in an Organized Health Care Education/Training Program

## 2018-09-01 ENCOUNTER — Ambulatory Visit (HOSPITAL_BASED_OUTPATIENT_CLINIC_OR_DEPARTMENT_OTHER): Payer: Medicare Other | Admitting: Student in an Organized Health Care Education/Training Program

## 2018-09-01 VITALS — BP 166/60 | HR 79 | Temp 99.7°F | Ht 70.0 in | Wt 160.0 lb

## 2018-09-01 DIAGNOSIS — M25552 Pain in left hip: Secondary | ICD-10-CM

## 2018-09-01 DIAGNOSIS — I739 Peripheral vascular disease, unspecified: Secondary | ICD-10-CM

## 2018-09-01 DIAGNOSIS — M160 Bilateral primary osteoarthritis of hip: Secondary | ICD-10-CM | POA: Diagnosis not present

## 2018-09-01 DIAGNOSIS — N183 Chronic kidney disease, stage 3 unspecified: Secondary | ICD-10-CM

## 2018-09-01 DIAGNOSIS — M533 Sacrococcygeal disorders, not elsewhere classified: Secondary | ICD-10-CM | POA: Diagnosis not present

## 2018-09-01 DIAGNOSIS — M1611 Unilateral primary osteoarthritis, right hip: Secondary | ICD-10-CM | POA: Diagnosis not present

## 2018-09-01 DIAGNOSIS — G8929 Other chronic pain: Secondary | ICD-10-CM

## 2018-09-01 DIAGNOSIS — M5136 Other intervertebral disc degeneration, lumbar region: Secondary | ICD-10-CM | POA: Diagnosis not present

## 2018-09-01 DIAGNOSIS — Z794 Long term (current) use of insulin: Secondary | ICD-10-CM

## 2018-09-01 DIAGNOSIS — M25551 Pain in right hip: Secondary | ICD-10-CM

## 2018-09-01 DIAGNOSIS — M545 Low back pain, unspecified: Secondary | ICD-10-CM

## 2018-09-01 DIAGNOSIS — E1122 Type 2 diabetes mellitus with diabetic chronic kidney disease: Secondary | ICD-10-CM | POA: Diagnosis not present

## 2018-09-01 DIAGNOSIS — M1612 Unilateral primary osteoarthritis, left hip: Secondary | ICD-10-CM | POA: Diagnosis not present

## 2018-09-01 DIAGNOSIS — M47818 Spondylosis without myelopathy or radiculopathy, sacral and sacrococcygeal region: Secondary | ICD-10-CM | POA: Diagnosis not present

## 2018-09-01 DIAGNOSIS — G894 Chronic pain syndrome: Secondary | ICD-10-CM | POA: Insufficient documentation

## 2018-09-01 DIAGNOSIS — I252 Old myocardial infarction: Secondary | ICD-10-CM | POA: Insufficient documentation

## 2018-09-01 DIAGNOSIS — G63 Polyneuropathy in diseases classified elsewhere: Secondary | ICD-10-CM | POA: Diagnosis not present

## 2018-09-01 IMAGING — CR LUMBAR SPINE - COMPLETE WITH BENDING VIEWS
7 series · 7 of 7 positions shown · non-contrast
Comparison: CT [DATE]

CLINICAL DATA: Chronic low back pain and bilateral hip pain.

EXAM:
LUMBAR SPINE - COMPLETE WITH BENDING VIEWS

[l-spine ap]
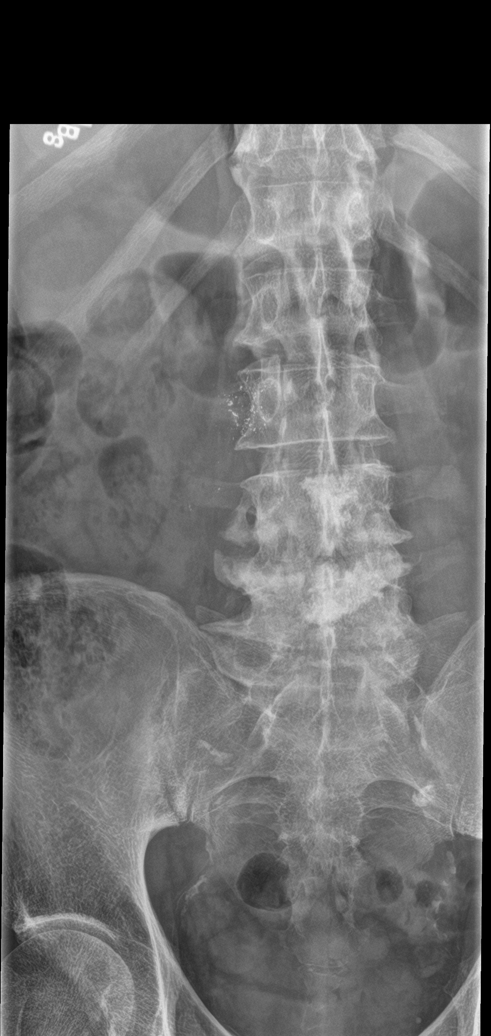

[l-spine obl (1 of 2)]
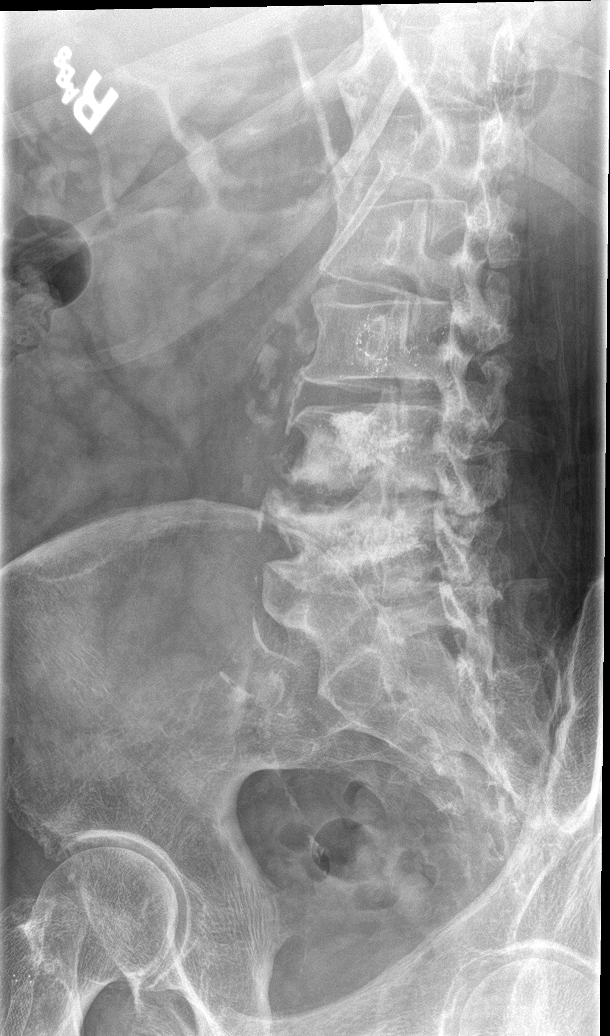

[l-spine obl (2 of 2)]
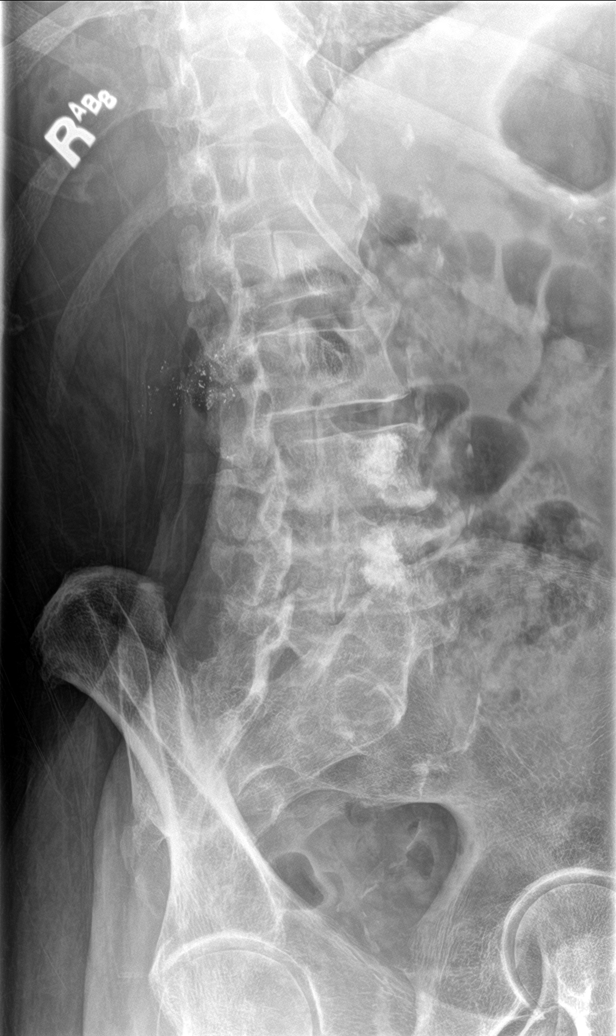

[l-spine lat]
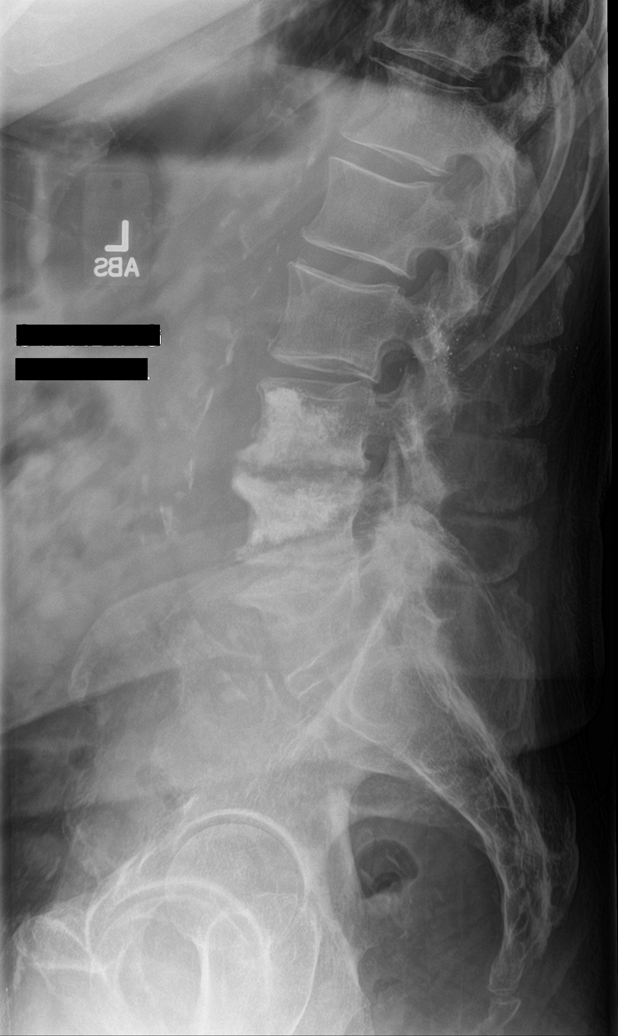

[l-spine spot]
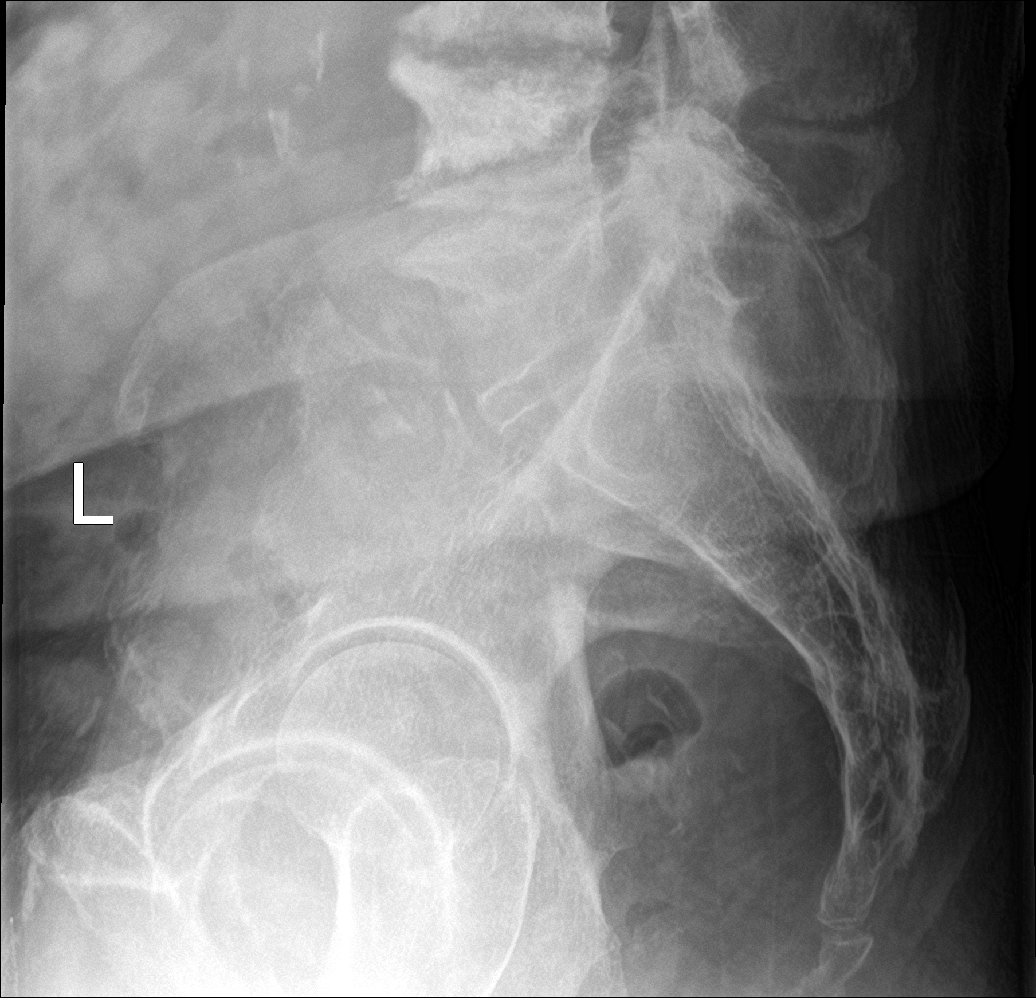

[l-spine flex]
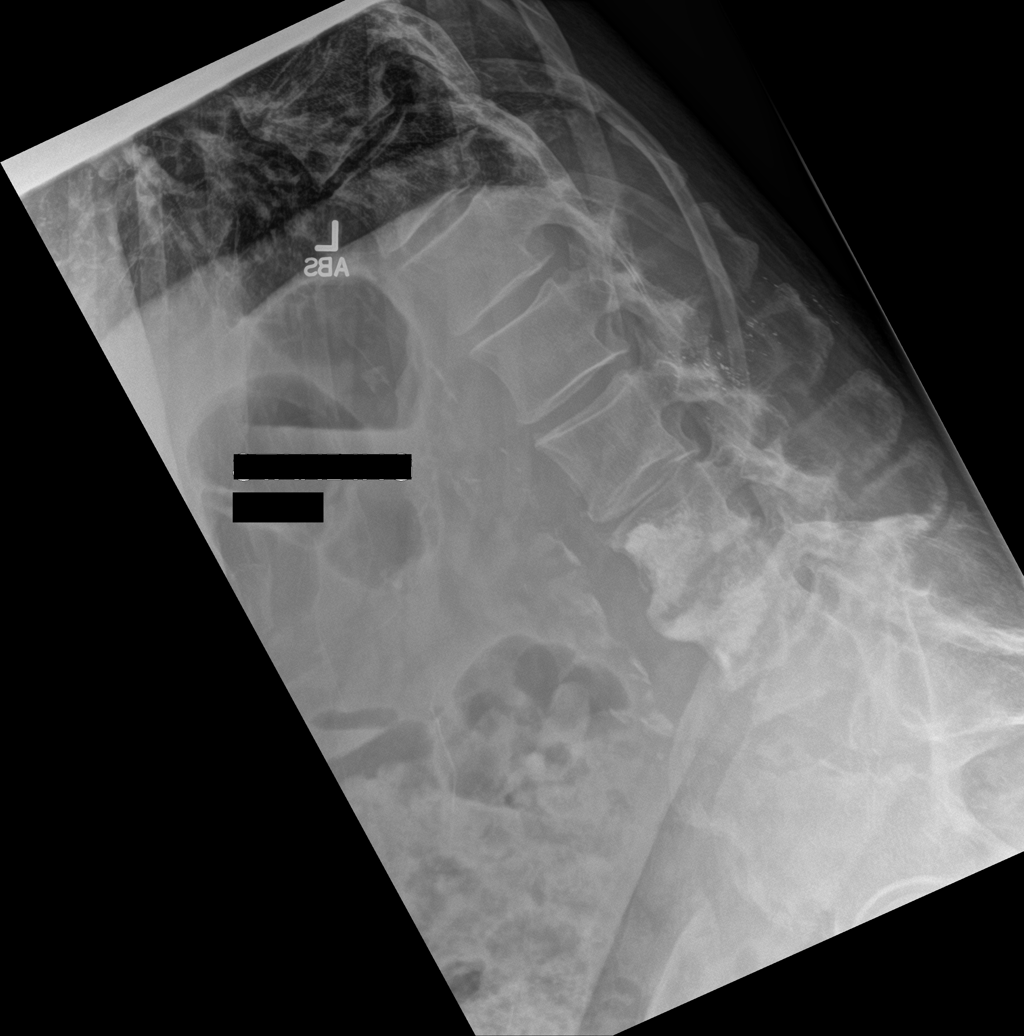

[l-spine ext]
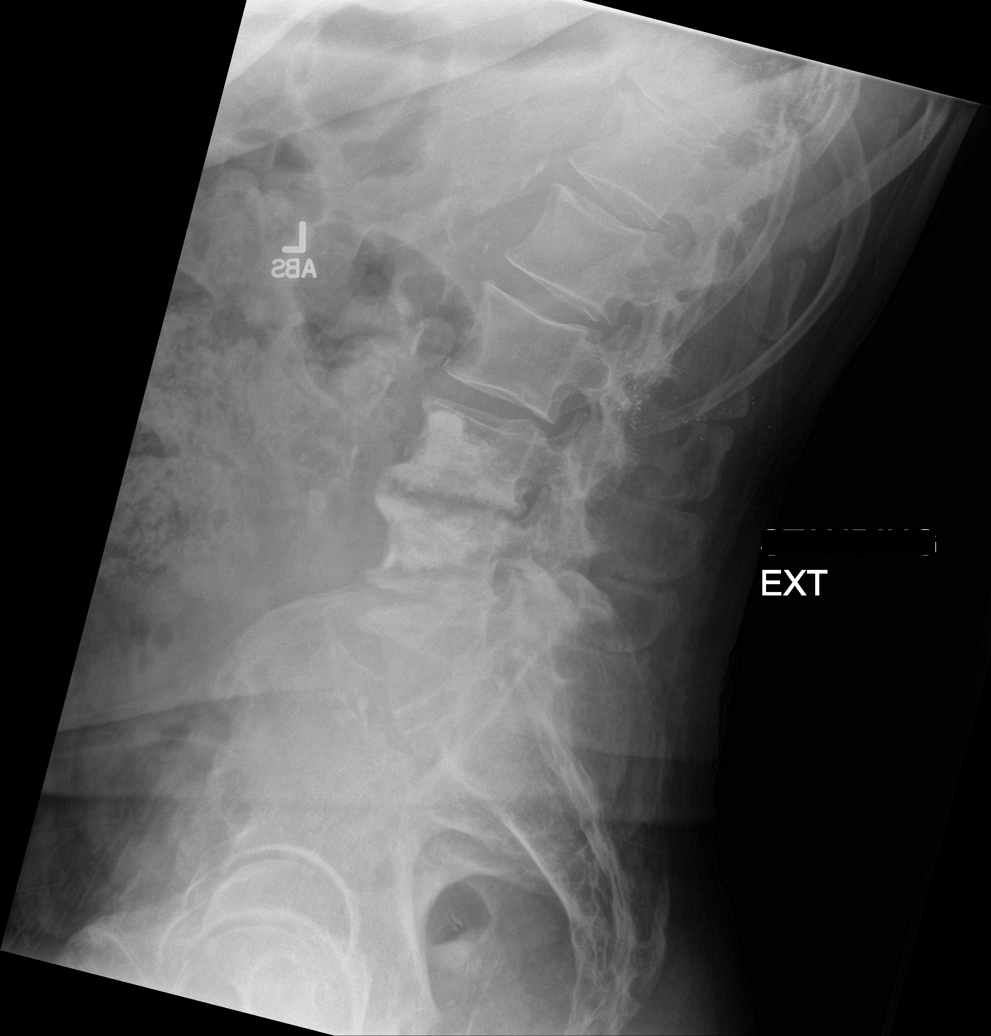

[7 of 7 positions shown; findings below may reference images not displayed]

FINDINGS: Vertebral body alignment and heights are within normal. Moderate
spondylosis of the mid to lower lumbar spine. Significant disc space
narrowing at the L3-4 and L4-5 levels and to lesser extent at the
L2-3 and L5-S1 levels. High density material over the L3 and L4
vertebral bodies possibly due to prior vertebroplasty. No evidence
of compression fracture or spondylolisthesis. No evidence of
instability on flexion and extension. Punctate radiopaque material
clustered over the right posterior elements of L2.
IMPRESSION: Moderate spondylosis of the lower lumbar spine with moderate
multilevel disc disease worse at the L3-4 L4-5 levels. No
compression fracture or spondylolisthesis. No instability on flexion
and extension.

High density material within the L3 and L4 vertebral bodies
suggesting previous vertebroplasty. Recommend clinical correlation.

## 2018-09-01 IMAGING — CR DG HIP (WITH OR WITHOUT PELVIS) 2-3V RIGHT
3 series · 3 of 3 positions shown · non-contrast
Comparison: KUB [DATE] and CT [DATE]

CLINICAL DATA: Chronic bilateral hip and low back pain.

EXAM:
DG HIP (WITH OR WITHOUT PELVIS) 2-3V RIGHT

[pelvis ap]
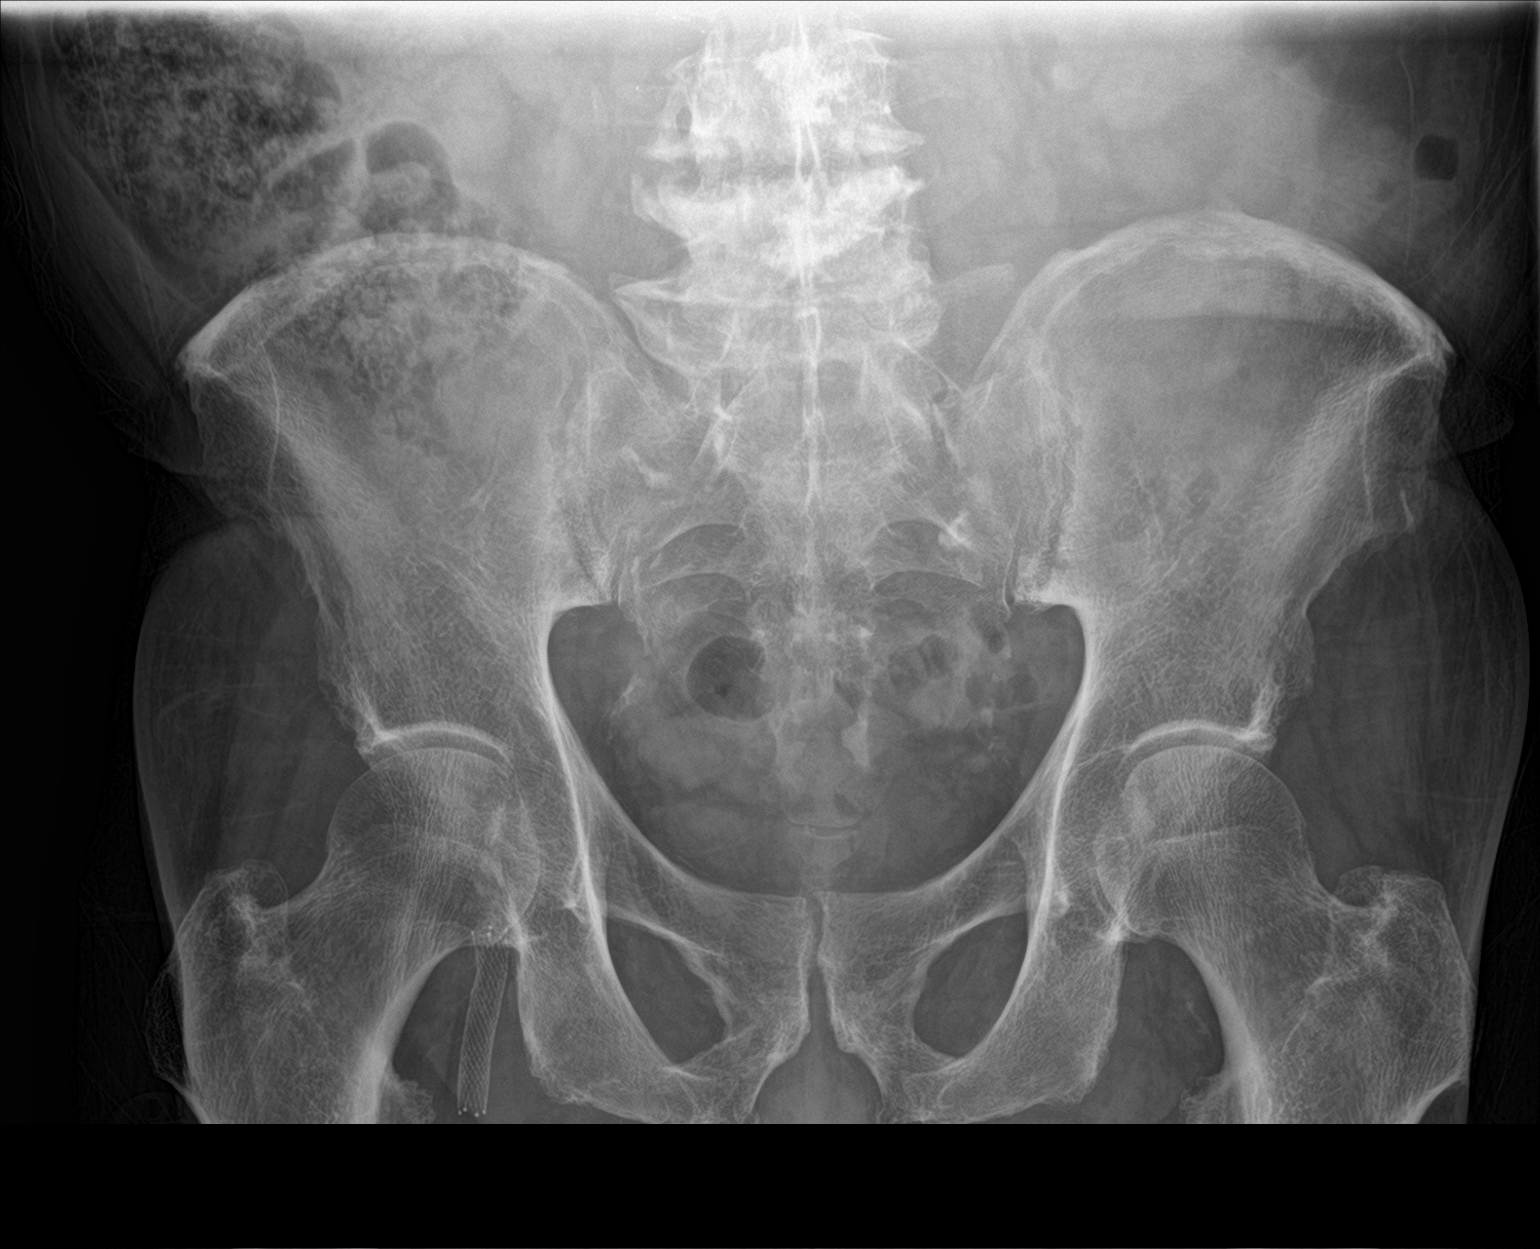

[hip ap]
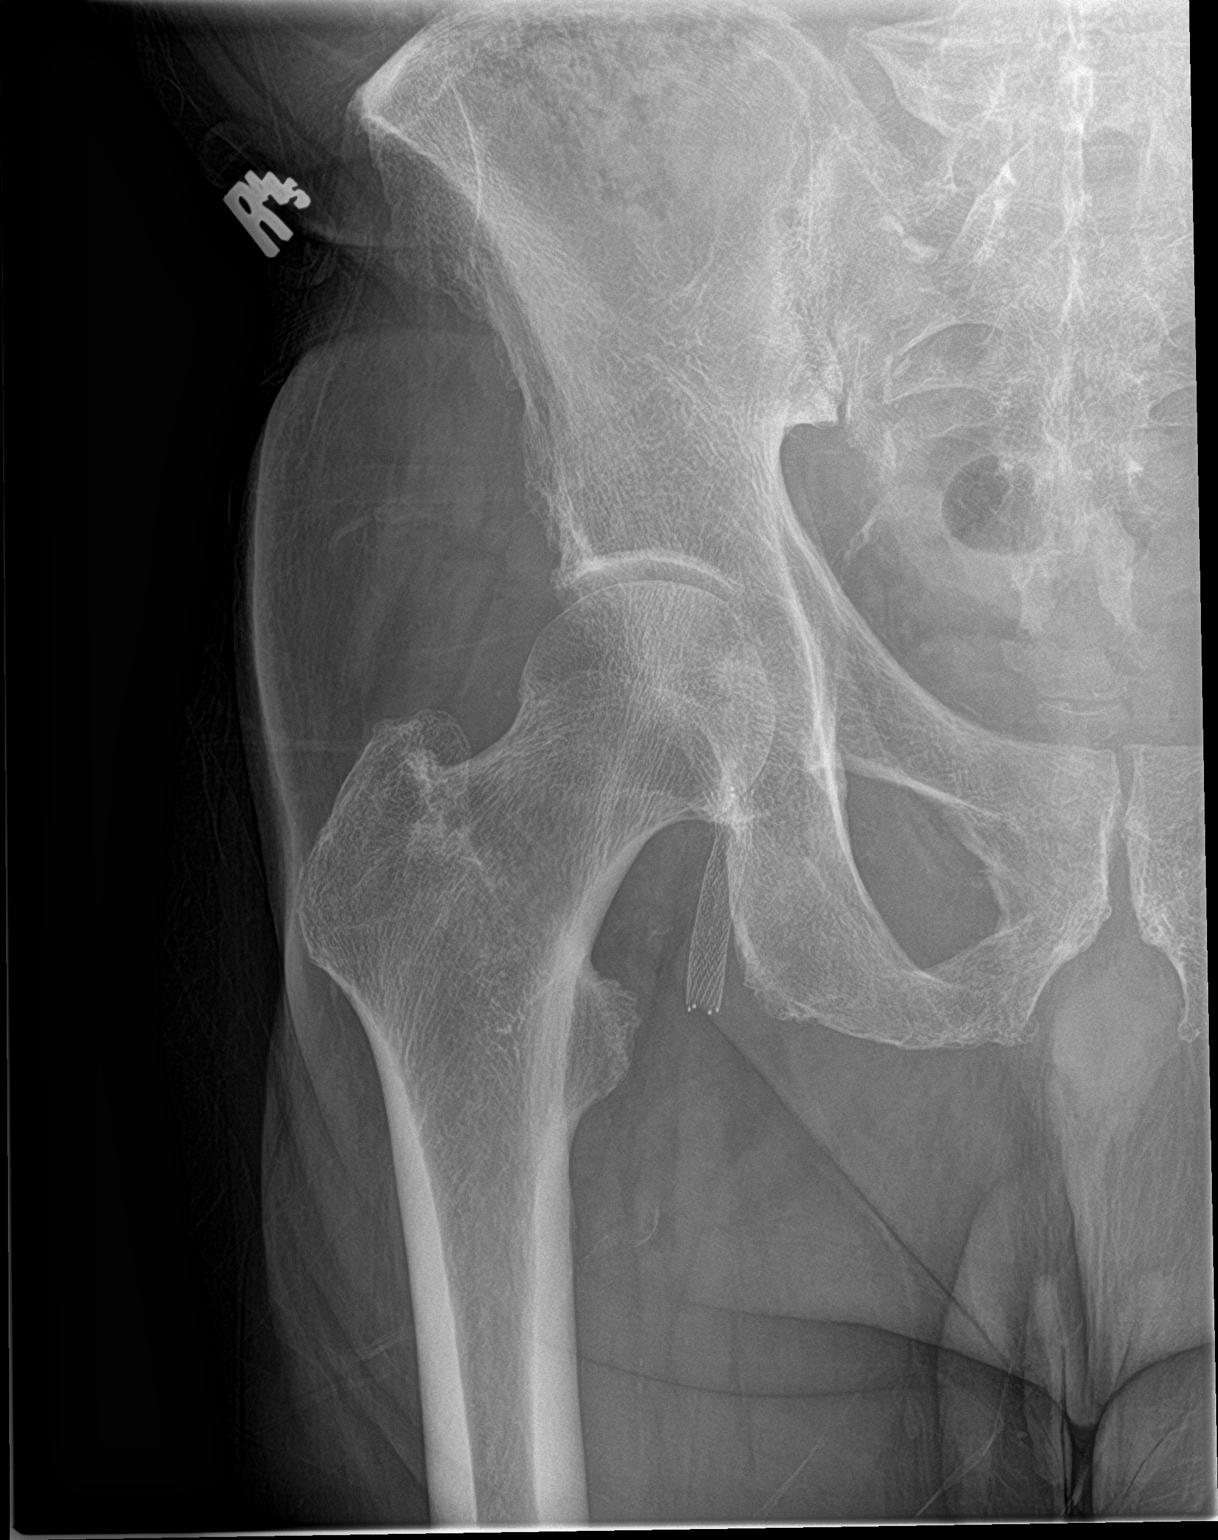

[hip lat]
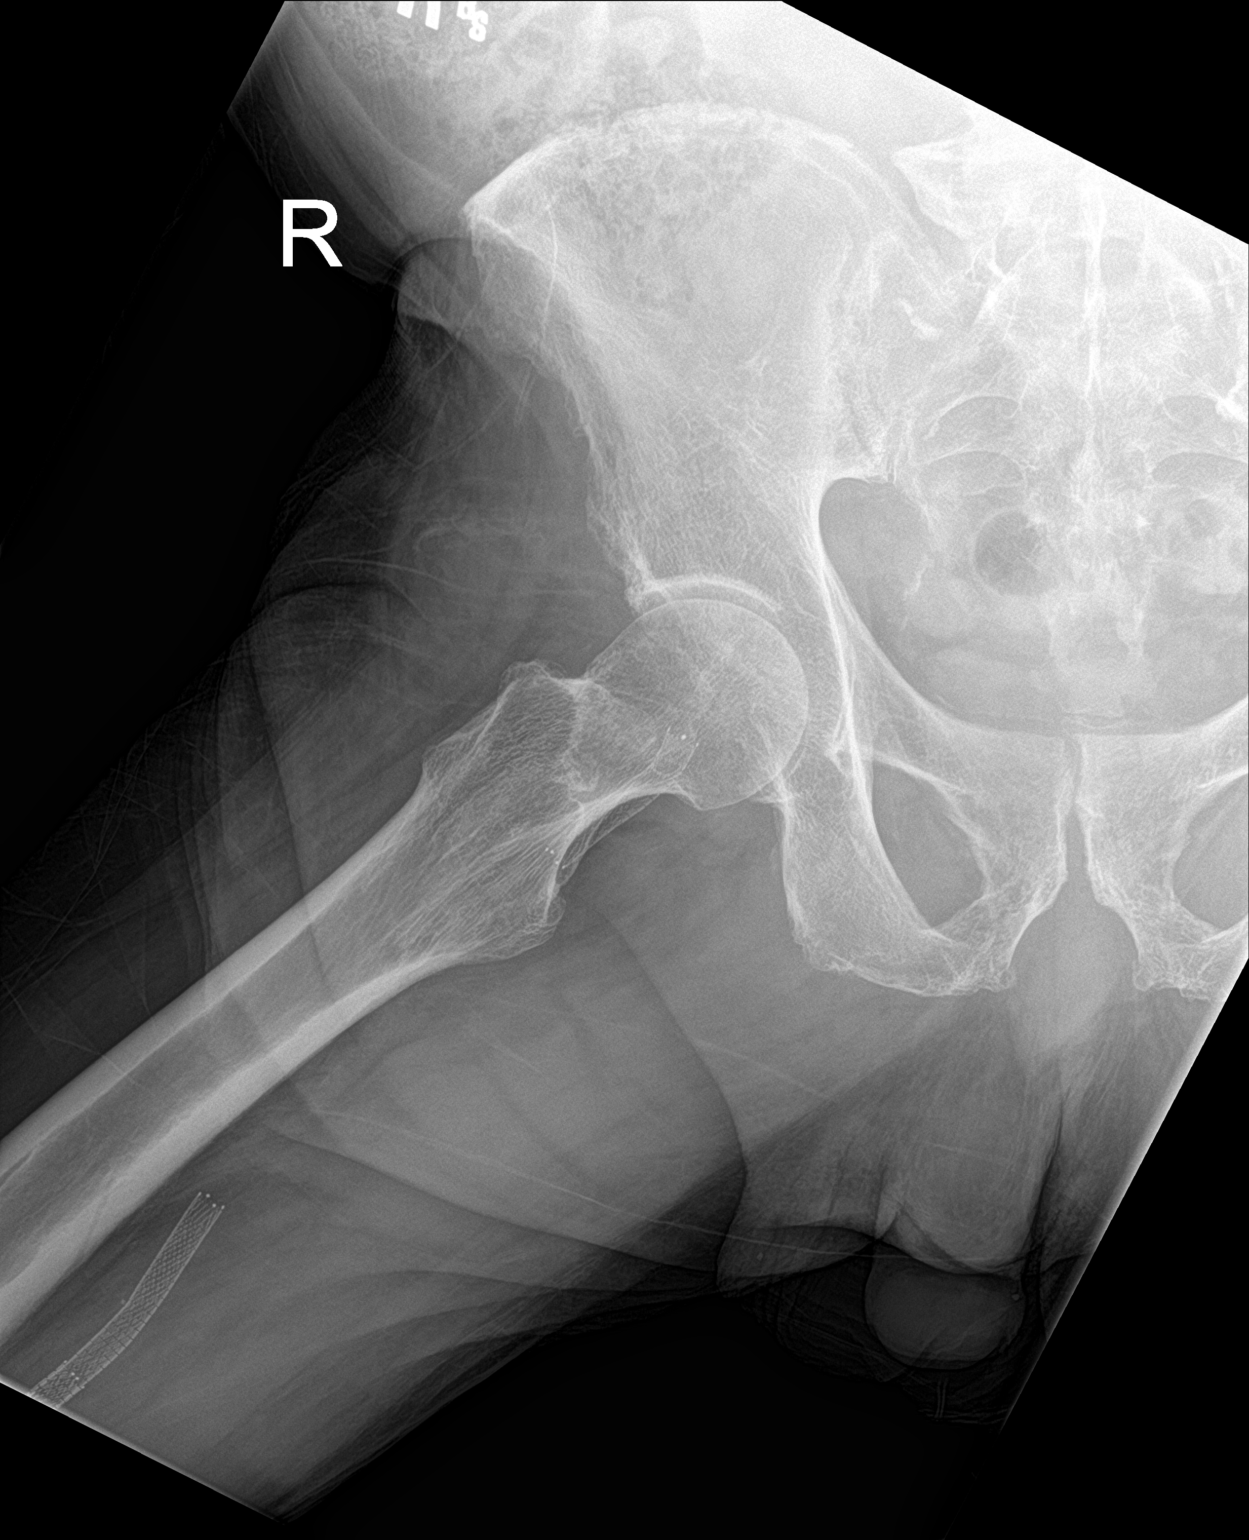

[3 of 3 positions shown; findings below may reference images not displayed]

FINDINGS: Mild symmetric degenerative change of the hips. No acute fracture or
dislocation. Two stents present over the right femoral vessels
proximal and mid to distally. Degenerative change of the spine and
sacroiliac joints.
IMPRESSION: No acute findings.

Mild symmetric degenerative change of the hips.
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
## 2018-09-01 IMAGING — CR DG HIP (WITH OR WITHOUT PELVIS) 2-3V LEFT
3 series · 3 of 3 positions shown · non-contrast
Comparison: [DATE] and CT [DATE].

CLINICAL DATA: Bilateral hip pain and low back pain.

EXAM:
DG HIP (WITH OR WITHOUT PELVIS) 2-3V LEFT

[hip ap]
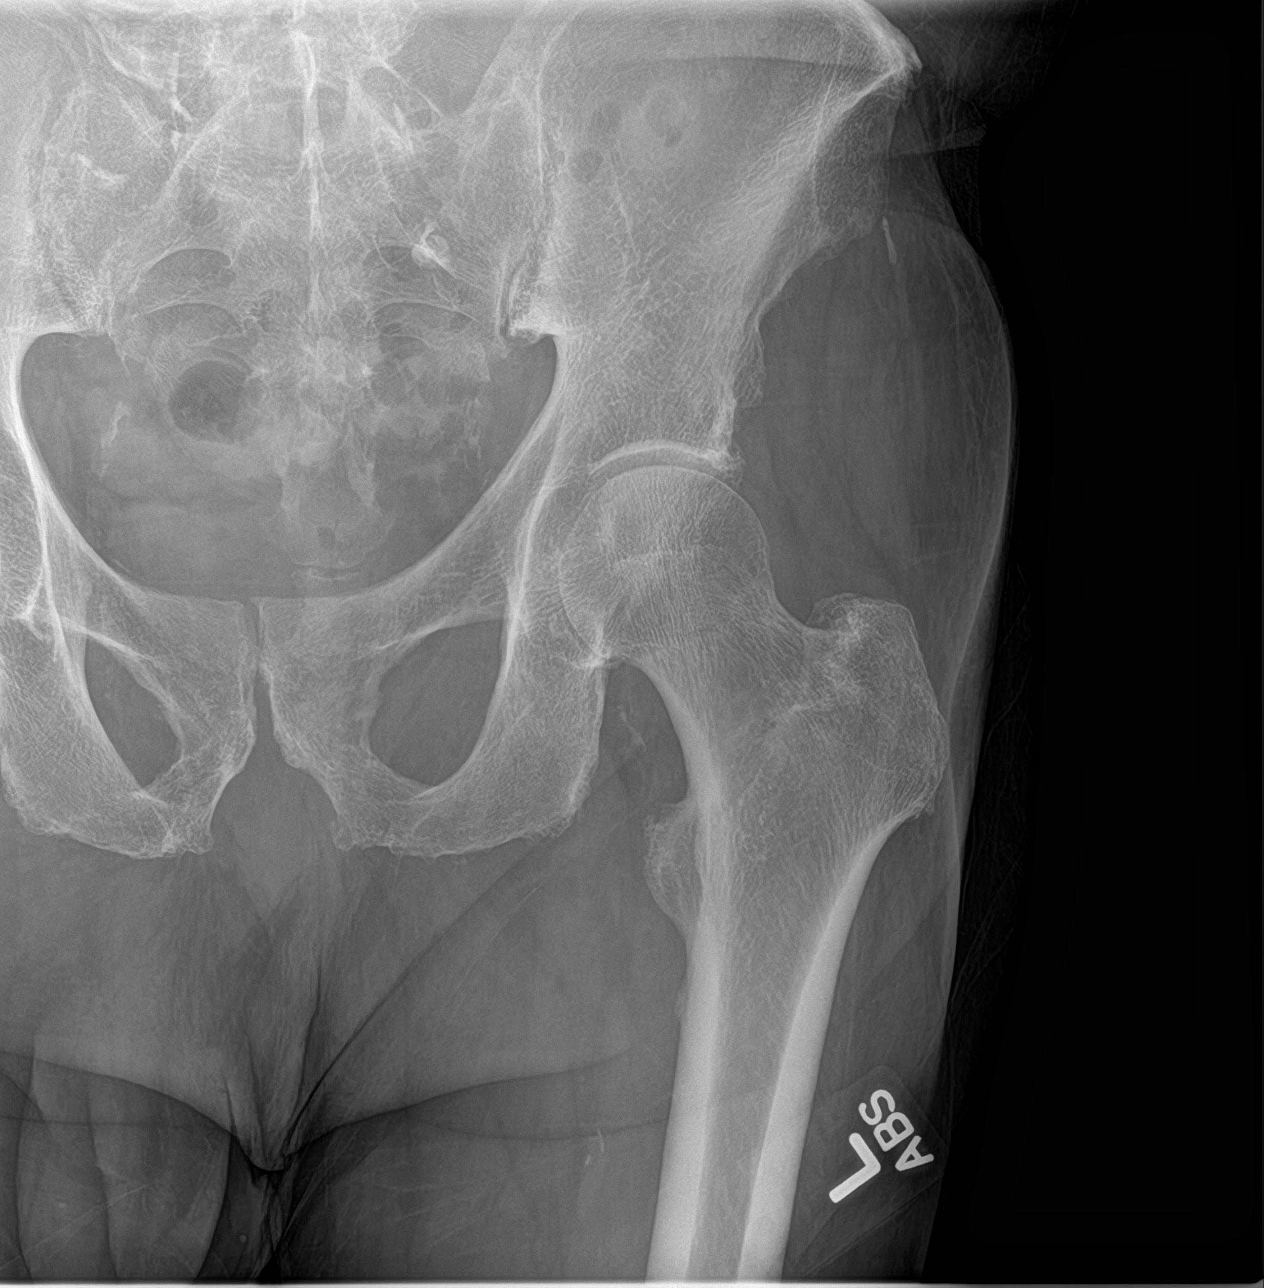

[hip lat]
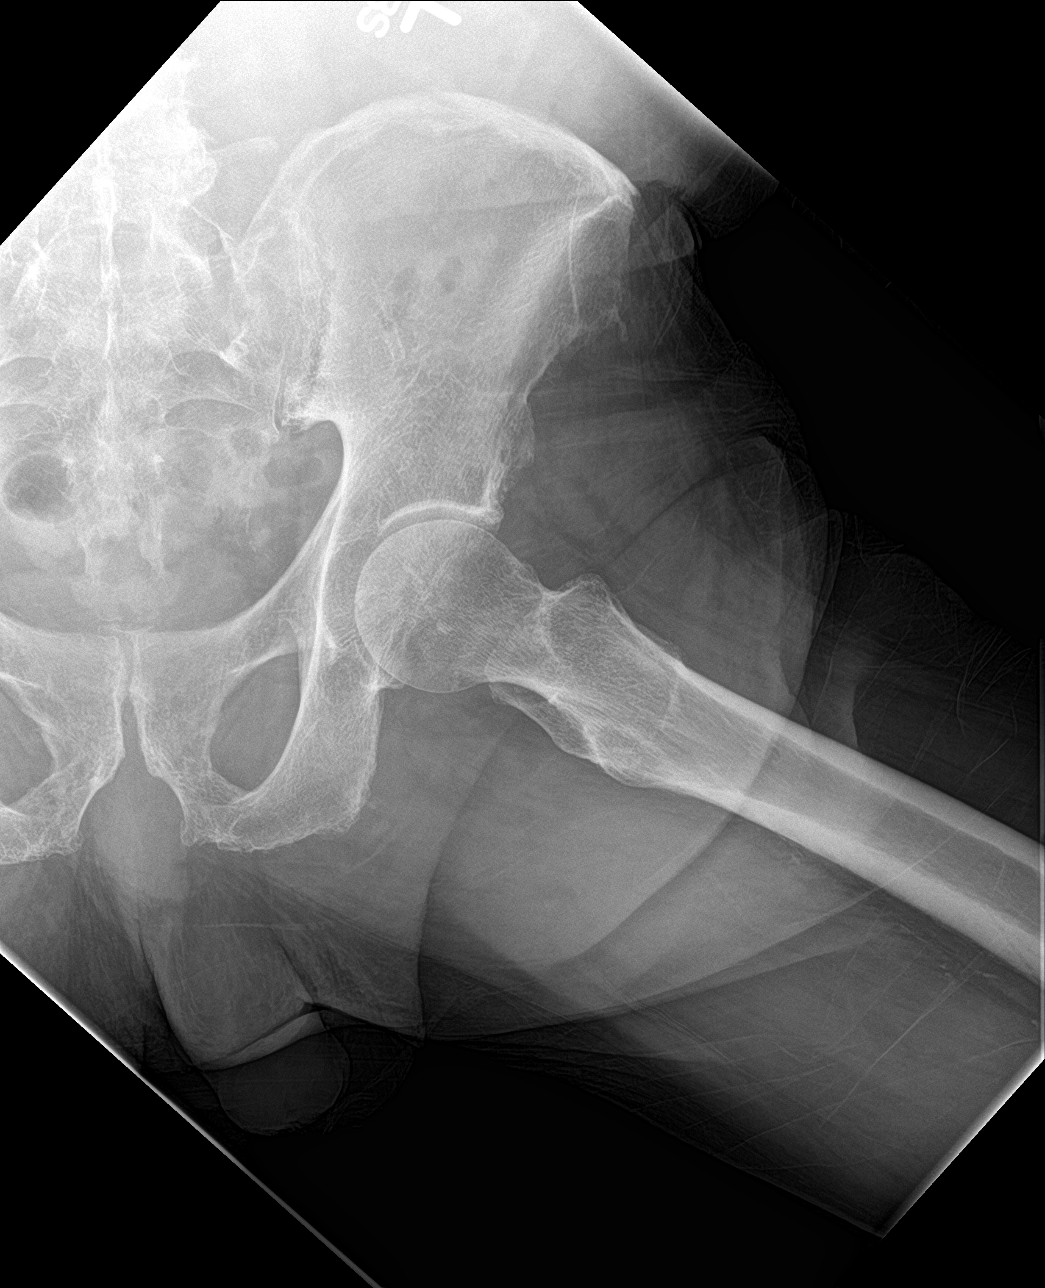

[pelvis ap]
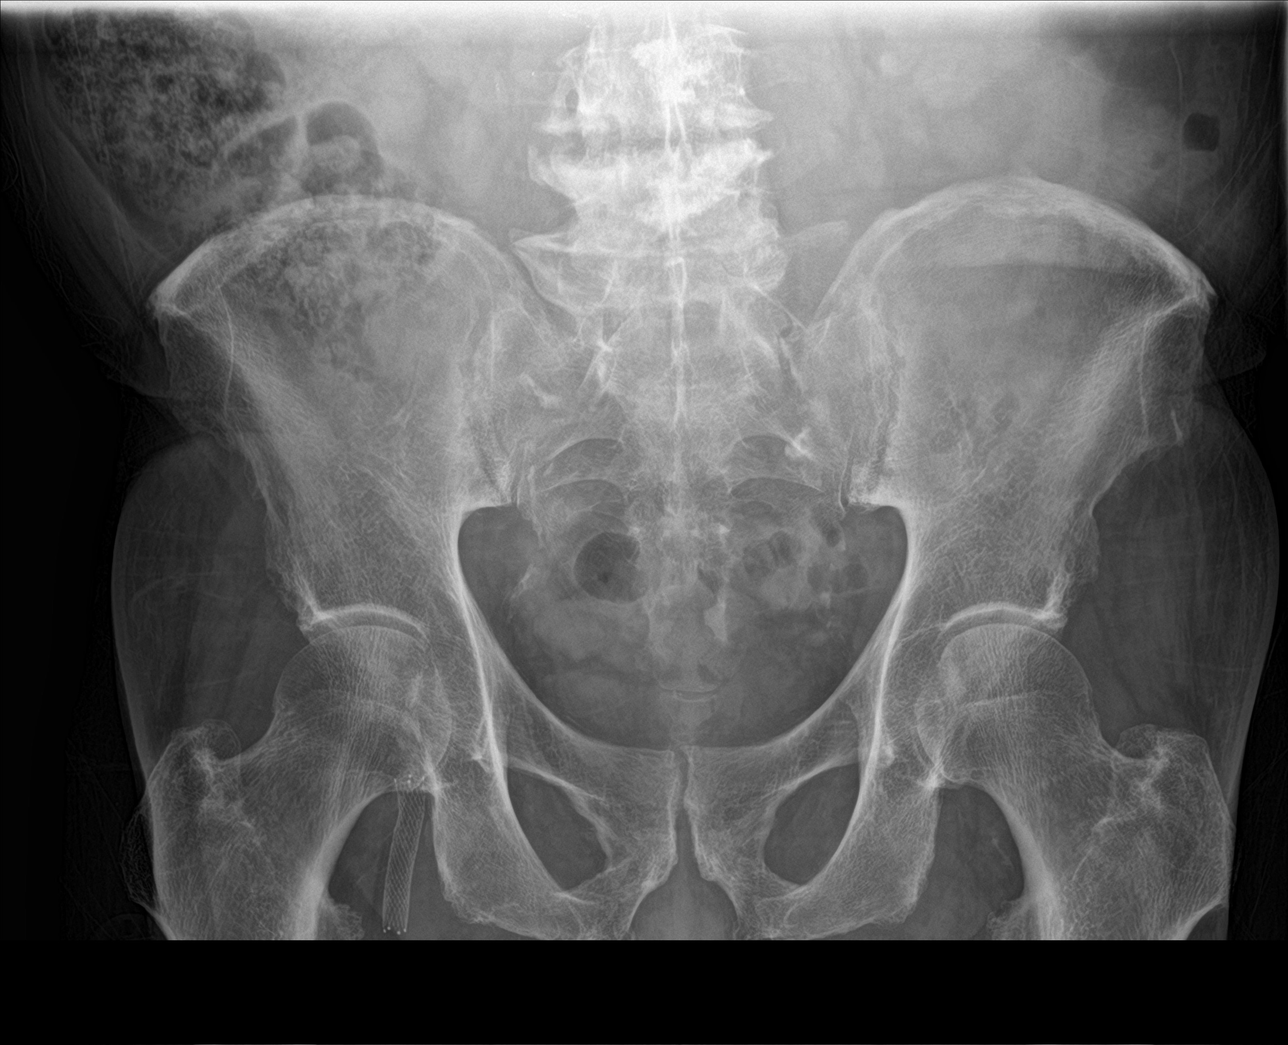

[3 of 3 positions shown; findings below may reference images not displayed]

FINDINGS: There mild symmetric degenerative changes of the hips. No evidence
of acute fracture or dislocation. Two vascular stents present over
the proximal and mid to distal right femoral vessels likely arterial
stents. Degenerative change of the spine and sacroiliac joints.
IMPRESSION: No acute findings.

Mild symmetric degenerative change of the hips.

## 2018-09-01 NOTE — Progress Notes (Signed)
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 informed that there is no guarantee that he would be a candidate for interventional therapies. The decision will be based on the results of diagnostic studies, as well as Mr. Manolis risk profile.  Procedure(s) under consideration:  Diagnostic lumbar facet medial branch nerve blocks Sacroiliac joint injection Piriformis release injection   Provider-requested follow-up: Return in about 1 week (around 09/08/2018) for After Imaging, virtual.  Future Appointments  Date Time Provider [place]  09/09/2018  2:00 PM Gillis Santa, MD [facility] None  09/14/2018  9:40 AM End, Harrell Gave, MD [facility]    Primary Care Physician: Lesleigh Noe, MD Location: [facility] Note by: Gillis Santa, MD Date: 09/01/2018; Time: 3:30 PM  Note: This dictation was prepared with Dragon dictation. Any transcriptional errors that may result from this process are unintentional.

## 2018-09-02 DIAGNOSIS — M25561 Pain in right knee: Secondary | ICD-10-CM | POA: Diagnosis not present

## 2018-09-02 DIAGNOSIS — M1711 Unilateral primary osteoarthritis, right knee: Secondary | ICD-10-CM | POA: Diagnosis not present

## 2018-09-08 ENCOUNTER — Other Ambulatory Visit: Payer: Self-pay

## 2018-09-08 ENCOUNTER — Encounter: Payer: Self-pay | Admitting: Student in an Organized Health Care Education/Training Program

## 2018-09-08 MED ORDER — FERROUS SULFATE 325 (65 FE) MG PO TABS: 325.0000 mg | ORAL_TABLET | Freq: Every day | ORAL | 1 refills | Status: DC

## 2018-09-08 NOTE — Telephone Encounter (Signed)
Refill request from pharmacy. First time filling for Dr. Einar Pheasant.

## 2018-09-09 ENCOUNTER — Encounter: Payer: Self-pay | Admitting: Student in an Organized Health Care Education/Training Program

## 2018-09-09 ENCOUNTER — Telehealth: Payer: Self-pay

## 2018-09-09 ENCOUNTER — Other Ambulatory Visit: Payer: Self-pay

## 2018-09-09 ENCOUNTER — Ambulatory Visit
Payer: Medicare Other | Attending: Student in an Organized Health Care Education/Training Program | Admitting: Student in an Organized Health Care Education/Training Program

## 2018-09-09 DIAGNOSIS — N183 Chronic kidney disease, stage 3 unspecified: Secondary | ICD-10-CM

## 2018-09-09 DIAGNOSIS — M47816 Spondylosis without myelopathy or radiculopathy, lumbar region: Secondary | ICD-10-CM

## 2018-09-09 DIAGNOSIS — M25552 Pain in left hip: Secondary | ICD-10-CM

## 2018-09-09 DIAGNOSIS — E1122 Type 2 diabetes mellitus with diabetic chronic kidney disease: Secondary | ICD-10-CM

## 2018-09-09 DIAGNOSIS — I739 Peripheral vascular disease, unspecified: Secondary | ICD-10-CM | POA: Diagnosis not present

## 2018-09-09 DIAGNOSIS — M47818 Spondylosis without myelopathy or radiculopathy, sacral and sacrococcygeal region: Secondary | ICD-10-CM | POA: Diagnosis not present

## 2018-09-09 DIAGNOSIS — M25551 Pain in right hip: Secondary | ICD-10-CM | POA: Diagnosis not present

## 2018-09-09 DIAGNOSIS — G894 Chronic pain syndrome: Secondary | ICD-10-CM | POA: Diagnosis not present

## 2018-09-09 DIAGNOSIS — M533 Sacrococcygeal disorders, not elsewhere classified: Secondary | ICD-10-CM | POA: Diagnosis not present

## 2018-09-09 DIAGNOSIS — I252 Old myocardial infarction: Secondary | ICD-10-CM | POA: Diagnosis not present

## 2018-09-09 DIAGNOSIS — G63 Polyneuropathy in diseases classified elsewhere: Secondary | ICD-10-CM

## 2018-09-09 DIAGNOSIS — M5136 Other intervertebral disc degeneration, lumbar region: Secondary | ICD-10-CM | POA: Diagnosis not present

## 2018-09-09 DIAGNOSIS — Z794 Long term (current) use of insulin: Secondary | ICD-10-CM

## 2018-09-09 MED ORDER — GABAPENTIN 300 MG PO CAPS: 300.0000 mg | ORAL_CAPSULE | Freq: Two times a day (BID) | ORAL | 2 refills | Status: DC

## 2018-09-09 NOTE — Patient Instructions (Signed)
Today we had a virtual discussion to review your x-ray studies.  You have significant arthritis in your lower lumbar spine which is the bottom portion of your back.  However you do not have any significant misalignment due to the arthritis which is a good thing.  You also have moderate arthritis in your sacroiliac joints which could be contributing also to your hip, buttock and occasional groin pain.  Lastly you have mild to moderate arthritis both your hip joints.
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
## 2018-09-09 NOTE — Telephone Encounter (Signed)
Dr Holley Raring requests that the AVS from today be sent to the patient please.  Thank you!

## 2018-09-11 ENCOUNTER — Telehealth: Payer: Self-pay | Admitting: Internal Medicine

## 2018-09-11 NOTE — Telephone Encounter (Signed)

## 2018-09-14 ENCOUNTER — Other Ambulatory Visit: Payer: Self-pay

## 2018-09-14 ENCOUNTER — Ambulatory Visit (INDEPENDENT_AMBULATORY_CARE_PROVIDER_SITE_OTHER): Payer: Medicare Other | Admitting: Internal Medicine

## 2018-09-14 ENCOUNTER — Encounter: Payer: Self-pay | Admitting: Internal Medicine

## 2018-09-14 VITALS — BP 150/42 | HR 61 | Temp 98.0°F | Ht 67.0 in | Wt 168.8 lb

## 2018-09-14 DIAGNOSIS — E785 Hyperlipidemia, unspecified: Secondary | ICD-10-CM | POA: Diagnosis not present

## 2018-09-14 DIAGNOSIS — I739 Peripheral vascular disease, unspecified: Secondary | ICD-10-CM

## 2018-09-14 DIAGNOSIS — I10 Essential (primary) hypertension: Secondary | ICD-10-CM

## 2018-09-14 DIAGNOSIS — I2510 Atherosclerotic heart disease of native coronary artery without angina pectoris: Secondary | ICD-10-CM
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
## 2018-09-14 NOTE — Patient Instructions (Signed)
Medication Instructions:  Your physician recommends that you continue on your current medications as directed. Please refer to the Current Medication list given to you today.  If you need a refill on your cardiac medications before your next appointment, please call your pharmacy.   Lab work: Your physician recommends that you return for lab work on the same morning as Lower extremity doppler and ABI. - You will need to be FASTING. Do not having anything to eat or drink after midnight other than water or black coffee.  If you have labs (blood work) drawn today and your tests are completely normal, you will receive your results only by: [name] MyChart Message (if you have MyChart) OR . A paper copy in the mail If you have any lab test that is abnormal or we need to change your treatment, we will call you to review the results.  Testing/Procedures: Your physician has requested that you have a lower extremity arterial doppler- During this test, ultrasound is used to evaluate arterial blood flow in the legs. Allow approximately one hour for this exam.   Your physician has requested that you have an ankle brachial index (ABI). During this test an ultrasound and blood pressure cuff are used to evaluate the arteries that supply the arms and legs with blood. Allow thirty minutes for this exam. There are no restrictions or special instructions. * Schedule lab work for same morning, please.    Follow-Up: At [facility], you and your health needs are our priority.  As part of our continuing mission to provide you with exceptional heart care, we have created designated Provider Care Teams.  These Care Teams include your primary Cardiologist (physician) and Advanced Practice Providers (APPs -  Physician Assistants and Nurse Practitioners) who all work together to provide you with the care you need, when you need it. You will need a follow up appointment in 6 months.  Please call our office 2 months in  advance to schedule this appointment.  You may see DR Harrell Gave END or one of the following Advanced Practice Providers on your designated Care Team:   Murray Hodgkins, NP Christell Faith, PA-C . Marrianne Mood, PA-C

## 2018-09-15 ENCOUNTER — Encounter: Payer: Self-pay | Admitting: Internal Medicine

## 2018-09-15 DIAGNOSIS — I2510 Atherosclerotic heart disease of native coronary artery without angina pectoris: Secondary | ICD-10-CM | POA: Insufficient documentation

## 2018-09-16 ENCOUNTER — Other Ambulatory Visit: Payer: Self-pay

## 2018-09-16 MED ORDER — CLOPIDOGREL BISULFATE 75 MG PO TABS: 75.0000 mg | ORAL_TABLET | Freq: Every day | ORAL | 3 refills | Status: DC

## 2018-09-16 NOTE — Telephone Encounter (Signed)
Refill request for Plavix came in from pharmacy. Patient is a new patient to Dr. Einar Pheasant and she has not filled this medication before yet. OK to fill?

## 2018-09-16 NOTE — Telephone Encounter (Signed)
Please send to cardiology

## 2018-09-23 ENCOUNTER — Other Ambulatory Visit: Payer: Self-pay | Admitting: Family Medicine

## 2018-09-23 DIAGNOSIS — M5416 Radiculopathy, lumbar region: Secondary | ICD-10-CM | POA: Diagnosis not present

## 2018-09-23 DIAGNOSIS — M545 Low back pain: Secondary | ICD-10-CM | POA: Diagnosis not present

## 2018-09-23 DIAGNOSIS — M47816 Spondylosis without myelopathy or radiculopathy, lumbar region: Secondary | ICD-10-CM | POA: Diagnosis not present

## 2018-09-29 ENCOUNTER — Telehealth (INDEPENDENT_AMBULATORY_CARE_PROVIDER_SITE_OTHER): Payer: Medicare Other

## 2018-09-29 DIAGNOSIS — N183 Chronic kidney disease, stage 3 unspecified: Secondary | ICD-10-CM

## 2018-09-29 DIAGNOSIS — Z794 Long term (current) use of insulin: Secondary | ICD-10-CM

## 2018-09-29 DIAGNOSIS — E1122 Type 2 diabetes mellitus with diabetic chronic kidney disease: Secondary | ICD-10-CM | POA: Diagnosis not present

## 2018-09-29 NOTE — Telephone Encounter (Signed)
Additional information needed:  How often is he checking his blood sugars? Is he still injecting Lantus insulin 20 units? Looks like he also needs updated A1C, any chance he can come in for POC A1C?

## 2018-09-29 NOTE — Telephone Encounter (Signed)
Patient checks sugar 2 to 3 times daily, takes Lantus 20 units once daily. Appointment for POC A1C made for 09/30/2018. Thank you

## 2018-09-29 NOTE — Telephone Encounter (Signed)
Spoke with patient's wife, ok per DPR on file. We received fax from [facility] asking to approve supplies. Patient's wife states they did want to get this through the company. Patient checks his sugars 2 to 3 times a day. He is using his insulin. Placing form in Dr Verda Cumins inbox for Alma Friendly, NP to review. Thank you.

## 2018-09-30 ENCOUNTER — Other Ambulatory Visit: Payer: Medicare Other

## 2018-09-30 LAB — POCT GLYCOSYLATED HEMOGLOBIN (HGB A1C): Hemoglobin A1C: 6.2 % — AB (ref 4.0–5.6)

## 2018-09-30 NOTE — Telephone Encounter (Signed)
Completed and placed in Anastasiya's inbox.

## 2018-09-30 NOTE — Telephone Encounter (Signed)
Form faxed over

## 2018-10-01 ENCOUNTER — Telehealth: Payer: Self-pay

## 2018-10-01 NOTE — Telephone Encounter (Signed)
Left message for patient to call back about results. 

## 2018-10-02 ENCOUNTER — Other Ambulatory Visit: Payer: Self-pay | Admitting: Family Medicine

## 2018-10-02 DIAGNOSIS — I10 Essential (primary) hypertension: Secondary | ICD-10-CM

## 2018-10-02 DIAGNOSIS — Z434 Encounter for attention to other artificial openings of digestive tract: Secondary | ICD-10-CM

## 2018-10-05 ENCOUNTER — Other Ambulatory Visit: Payer: Self-pay | Admitting: *Deleted

## 2018-10-05 ENCOUNTER — Other Ambulatory Visit: Payer: Self-pay

## 2018-10-05 ENCOUNTER — Other Ambulatory Visit: Payer: Medicare Other

## 2018-10-05 ENCOUNTER — Other Ambulatory Visit
Admission: RE | Admit: 2018-10-05 | Discharge: 2018-10-05 | Disposition: A | Discharge status: Home / Self Care | Payer: Medicare Other | Source: Ambulatory Visit | Attending: Internal Medicine | Admitting: Internal Medicine

## 2018-10-05 ENCOUNTER — Ambulatory Visit (INDEPENDENT_AMBULATORY_CARE_PROVIDER_SITE_OTHER): Payer: Medicare Other

## 2018-10-05 DIAGNOSIS — E785 Hyperlipidemia, unspecified: Secondary | ICD-10-CM

## 2018-10-05 DIAGNOSIS — I2510 Atherosclerotic heart disease of native coronary artery without angina pectoris: Secondary | ICD-10-CM | POA: Diagnosis not present

## 2018-10-05 DIAGNOSIS — I10 Essential (primary) hypertension: Secondary | ICD-10-CM

## 2018-10-05 DIAGNOSIS — I739 Peripheral vascular disease, unspecified: Secondary | ICD-10-CM

## 2018-10-05 LAB — COMPREHENSIVE METABOLIC PANEL
ALT: 38 U/L (ref 0–44)
AST: 35 U/L (ref 15–41)
Albumin: 4 g/dL (ref 3.5–5.0)
Alkaline Phosphatase: 73 U/L (ref 38–126)
Anion gap: 9 (ref 5–15)
BUN: 40 mg/dL — ABNORMAL HIGH (ref 8–23)
CO2: 23 mmol/L (ref 22–32)
Calcium: 8.6 mg/dL — ABNORMAL LOW (ref 8.9–10.3)
Chloride: 107 mmol/L (ref 98–111)
Creatinine, Ser: 1.38 mg/dL — ABNORMAL HIGH (ref 0.61–1.24)
GFR calc Af Amer: 53 mL/min — ABNORMAL LOW (ref >60)
GFR calc non Af Amer: 46 mL/min — ABNORMAL LOW (ref >60)
Glucose, Bld: 83 mg/dL (ref 70–99)
Potassium: 4.4 mmol/L (ref 3.5–5.1)
Sodium: 139 mmol/L (ref 135–145)
Total Bilirubin: 0.8 mg/dL (ref 0.3–1.2)
Total Protein: 6.7 g/dL (ref 6.5–8.1)

## 2018-10-05 LAB — LIPID PANEL
Cholesterol: 116 mg/dL (ref 0–200)
HDL: 58 mg/dL (ref >40)
LDL Cholesterol: 47 mg/dL (ref 0–99)
Total CHOL/HDL Ratio: 2 RATIO
Triglycerides: 55 mg/dL (ref <150)
VLDL: 11 mg/dL (ref 0–40)

## 2018-10-07 ENCOUNTER — Ambulatory Visit (INDEPENDENT_AMBULATORY_CARE_PROVIDER_SITE_OTHER): Payer: Medicare Other

## 2018-10-07 ENCOUNTER — Telehealth: Payer: Self-pay | Admitting: *Deleted

## 2018-10-07 ENCOUNTER — Ambulatory Visit (INDEPENDENT_AMBULATORY_CARE_PROVIDER_SITE_OTHER): Payer: Medicare Other | Admitting: Podiatry

## 2018-10-07 ENCOUNTER — Encounter: Payer: Self-pay | Admitting: *Deleted

## 2018-10-07 ENCOUNTER — Encounter: Payer: Self-pay | Admitting: Podiatry

## 2018-10-07 ENCOUNTER — Other Ambulatory Visit: Payer: Self-pay

## 2018-10-07 DIAGNOSIS — I739 Peripheral vascular disease, unspecified: Secondary | ICD-10-CM | POA: Diagnosis not present

## 2018-10-07 DIAGNOSIS — I2510 Atherosclerotic heart disease of native coronary artery without angina pectoris: Secondary | ICD-10-CM | POA: Diagnosis not present

## 2018-10-07 DIAGNOSIS — E1142 Type 2 diabetes mellitus with diabetic polyneuropathy: Secondary | ICD-10-CM | POA: Diagnosis not present

## 2018-10-07 NOTE — Telephone Encounter (Signed)
Patient's wife, ok per DPR, calling back to hear the results as well. Results given to wife as well and she verbalized understanding. Letter with results mailed to patient as well.
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
## 2018-10-12 ENCOUNTER — Encounter: Payer: Self-pay | Admitting: Surgery

## 2018-10-12 ENCOUNTER — Ambulatory Visit (INDEPENDENT_AMBULATORY_CARE_PROVIDER_SITE_OTHER): Payer: Medicare Other | Admitting: Surgery

## 2018-10-12 ENCOUNTER — Other Ambulatory Visit: Payer: Self-pay

## 2018-10-12 VITALS — BP 147/63 | HR 80 | Temp 97.9°F | Ht 67.0 in | Wt 165.0 lb

## 2018-10-12 DIAGNOSIS — K810 Acute cholecystitis: Secondary | ICD-10-CM | POA: Diagnosis not present

## 2018-10-12 DIAGNOSIS — I2510 Atherosclerotic heart disease of native coronary artery without angina pectoris: Secondary | ICD-10-CM | POA: Diagnosis not present

## 2018-10-12 NOTE — Patient Instructions (Addendum)
Follow up in 6 months  Please call and ask to speak with a nurse if you develop questions or concerns.

## 2018-10-13 ENCOUNTER — Encounter: Payer: Self-pay | Admitting: Surgery
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
## 2018-10-22 ENCOUNTER — Telehealth: Payer: Self-pay

## 2018-10-22 DIAGNOSIS — E1142 Type 2 diabetes mellitus with diabetic polyneuropathy: Secondary | ICD-10-CM

## 2018-10-22 DIAGNOSIS — I739 Peripheral vascular disease, unspecified: Secondary | ICD-10-CM

## 2018-10-22 NOTE — Telephone Encounter (Signed)
Referral has been faxed to [facility] 

## 2018-10-22 NOTE — Telephone Encounter (Signed)
-----   Message from Rip Harbour, [facility] sent at 10/07/2018  1:26 PM EDT ----- Regarding: Vascular Vascular surgery referral -   History of PVD with amputation, wound right foot, arterial studies with ABI's and consult

## 2018-10-22 NOTE — Telephone Encounter (Signed)
-----   Message from Rip Harbour, [facility] sent at 10/07/2018  1:26 PM EDT ----- Regarding: Vascular Vascular surgery referral -   History of PVD with amputation, wound right foot, arterial studies with ABI's and consult

## 2018-10-28 ENCOUNTER — Telehealth: Payer: Self-pay | Admitting: Internal Medicine

## 2018-10-28 DIAGNOSIS — M47816 Spondylosis without myelopathy or radiculopathy, lumbar region: Secondary | ICD-10-CM | POA: Diagnosis not present

## 2018-10-28 NOTE — Telephone Encounter (Signed)
° °  [facility] Pre-operative Risk Assessment    Request for surgical clearance:  1. What type of surgery is being performed? Medial Branch Block  2. When is this surgery scheduled? TBD  3. What type of clearance is required (medical clearance vs. Pharmacy clearance to hold med vs. Both)? both  4. Are there any medications that need to be held prior to surgery and how long? Plavix for 7 days prior  5. Practice name and name of physician performing surgery? [facility] Dr. Maryjean Ka  6. What is your office phone number [phone]   7.   What is your office fax number [phone]  8.   Anesthesia type (None, local, MAC, general) ? Not noted   Marykay Lex 10/28/2018, 3:45 PM  _________________________________________________________________   (provider comments below)

## 2018-10-29 NOTE — Telephone Encounter (Signed)
   Primary Cardiologist: [name], MD  Chart reviewed as part of pre-operative protocol coverage. Patient was contacted 10/29/2018 in reference to pre-operative risk assessment for pending surgery as outlined below. Pt has a history of MI and stent to the RCA in 03/2018.  Dr. Saunders Revel recommends completing 12 months of dual antiplatelet therapy from the time of his MI/PCI.  Dr. Saunders Revel states that  If he is having significant pain/neurologic symptoms and must have interruption of DAPT due to high bleeding risk associated with the neurosurgical procedure, clopidogrel could be held for 5 days before the procedure and restarted as soon as possible.  Aspirin 81 mg daily should be continued in the periprocedural period.  The longer uninterrupted DAPT can be continued, the lower the risk for stent thrombosis.  I discussed Dr. Darnelle Bos recommendations with [name] and his wife. [name] is leaning towards holding off on the requested procedure until February, not wanting to take any chances with his stent.   I will route this recommendation to the requesting party via Epic fax function and remove from pre-op pool.  Please call with questions.  Daune Perch, NP 10/29/2018, 11:34 AM

## 2018-10-29 NOTE — Telephone Encounter (Signed)
Dr. Saunders Revel, this pt has hx of PAD s/p stent to SFA/popliteal artery and CAD s/p PCI to RCA 03/27/2018 (Xience [place] 2.5 x 38 mm and Xience [place] 2.5 x 23 mm) in [place].   Neurosurgery is requesting to hold Plavix for 7 days prior to a medial branch block. What is you input on this?  Please route response back to P CV DIV PREOP  Thank you

## 2018-10-29 NOTE — Telephone Encounter (Signed)
Pt is out ~7 months from PCI to RCA in the setting of NSTEMI.  Ideally, I recommend at completing 12 months of DAPT from the time of MI/PCI.  If he is having significant pain/neurologic symptoms and must have interruption of DAPT due to high bleeding risk associated with the neurosurgical procedure, clopidogrel could be held for 5 days before the procedure and restarted as soon as possible.  Aspirin 81 mg daily should be continued in the periprocedural period.  The longer uninterrupted DAPT can be continued, the lower the risk for stent thrombosis.  [name], MD [facility] Pager: [phone]

## 2018-11-02 ENCOUNTER — Telehealth: Payer: Self-pay | Admitting: Family Medicine

## 2018-11-02 NOTE — Telephone Encounter (Signed)
Patient's wife, [name],returned Anastasiya's call with patient's blood sugars.  Patient's blood sugars in the morning are under 120 and at night 160-200. Patient gets his Lantus through [facility].

## 2018-11-02 NOTE — Telephone Encounter (Signed)
That's fine. Can refill his Lantus if needed. Have him closely monitor for low readings, below 100 on a consistent basis. I will cc PCP as FYI. Seems like he's stable on his current regimen but we may need to reduce insulin in the future.

## 2018-11-02 NOTE — Telephone Encounter (Signed)
Left message for call back.

## 2018-11-02 NOTE — Telephone Encounter (Signed)
Surgeon's office calling in with questions regarding clearance: -need clarification on the "interruption" on plavix -In the clearance reply, there was a "[name]" noted. Office is needing clarification on if that is a nickname for the patient or another provider or such.   Please advise Jovannah at [phone] ext [phone]

## 2018-11-02 NOTE — Telephone Encounter (Signed)
Vaughan Basta advised and verbalized understanding

## 2018-11-02 NOTE — Telephone Encounter (Signed)
Peter Kraft, do you need me to get more information?

## 2018-11-03 NOTE — Telephone Encounter (Signed)
I called and left a voice message for Peter [name] to clarify that stopping anticoagulation at this point could risk stent thrombosis and the patient, Peter [name], would like to not proceed with medial branch block that would require holding plavix until he is 12 months post cardiac stent placement (03/2019). The patient understands that if his condition worsens and he feels that the procedure is very necessary, he can hold his Plavix with some risk and have the procedure.

## 2018-11-17 ENCOUNTER — Telehealth: Payer: Self-pay | Admitting: Family Medicine

## 2018-11-17 MED ORDER — ATORVASTATIN CALCIUM 40 MG PO TABS: 40.0000 mg | ORAL_TABLET | Freq: Every day | ORAL | 1 refills | Status: DC

## 2018-11-17 NOTE — Telephone Encounter (Signed)
Vaughan Basta, patient's wife, advised of refill. Also scheduled 6 months follow up with Dr Einar Pheasant for patient.

## 2018-11-17 NOTE — Telephone Encounter (Signed)
Patient's wife called requesting a new prescription for generic Lipitor.  Patient uses [facility].

## 2018-11-19 ENCOUNTER — Ambulatory Visit (INDEPENDENT_AMBULATORY_CARE_PROVIDER_SITE_OTHER): Payer: Medicare Other | Admitting: Vascular Surgery

## 2018-11-19 ENCOUNTER — Ambulatory Visit (INDEPENDENT_AMBULATORY_CARE_PROVIDER_SITE_OTHER): Payer: Medicare Other

## 2018-11-19 ENCOUNTER — Encounter (INDEPENDENT_AMBULATORY_CARE_PROVIDER_SITE_OTHER): Payer: Self-pay

## 2018-11-19 ENCOUNTER — Other Ambulatory Visit: Payer: Self-pay

## 2018-11-19 ENCOUNTER — Encounter (INDEPENDENT_AMBULATORY_CARE_PROVIDER_SITE_OTHER): Payer: Self-pay | Admitting: Vascular Surgery

## 2018-11-19 VITALS — BP 147/53 | HR 68 | Resp 16 | Wt 173.0 lb

## 2018-11-19 DIAGNOSIS — I2510 Atherosclerotic heart disease of native coronary artery without angina pectoris: Secondary | ICD-10-CM | POA: Diagnosis not present

## 2018-11-19 DIAGNOSIS — E782 Mixed hyperlipidemia: Secondary | ICD-10-CM

## 2018-11-19 DIAGNOSIS — I70223 Atherosclerosis of native arteries of extremities with rest pain, bilateral legs: Secondary | ICD-10-CM | POA: Diagnosis not present

## 2018-11-19 DIAGNOSIS — Z794 Long term (current) use of insulin: Secondary | ICD-10-CM | POA: Diagnosis not present

## 2018-11-19 DIAGNOSIS — E1142 Type 2 diabetes mellitus with diabetic polyneuropathy: Secondary | ICD-10-CM | POA: Diagnosis not present

## 2018-11-19 DIAGNOSIS — I10 Essential (primary) hypertension: Secondary | ICD-10-CM | POA: Diagnosis not present

## 2018-11-19 DIAGNOSIS — N183 Chronic kidney disease, stage 3 unspecified: Secondary | ICD-10-CM

## 2018-11-19 DIAGNOSIS — I739 Peripheral vascular disease, unspecified: Secondary | ICD-10-CM | POA: Diagnosis not present

## 2018-11-19 DIAGNOSIS — E1122 Type 2 diabetes mellitus with diabetic chronic kidney disease: Secondary | ICD-10-CM | POA: Diagnosis not present
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
## 2018-11-20 ENCOUNTER — Telehealth (INDEPENDENT_AMBULATORY_CARE_PROVIDER_SITE_OTHER): Payer: Self-pay

## 2018-11-20 NOTE — Telephone Encounter (Signed)
Spoke with the patient and he is now on the scheduled with Dr. Delana Meyer for a leg angio on 12/01/2018, with a 6:45 am arrival time to the [facility]. Patient will do his Covid testing on 11/27/2018 between 12:30-2:30 pm at the [facility]. Pre-procedure instructions were discussed and will be mailed out.

## 2018-11-23 ENCOUNTER — Telehealth (INDEPENDENT_AMBULATORY_CARE_PROVIDER_SITE_OTHER): Payer: Self-pay

## 2018-11-23 NOTE — Telephone Encounter (Signed)
Patient's wife called wanting to know should we wait until his cardiology appt due to the patient being on Plavix for heart attack. I explained that the patient would not stop his Plavix for this procedure and that I did not need a clearance from his cardiologist. I went over pre-procedure instructions as well.

## 2018-11-27 ENCOUNTER — Other Ambulatory Visit
Admission: RE | Admit: 2018-11-27 | Discharge: 2018-11-27 | Disposition: A | Discharge status: Home / Self Care | Payer: Medicare Other | Source: Ambulatory Visit | Attending: Vascular Surgery | Admitting: Vascular Surgery

## 2018-11-27 ENCOUNTER — Other Ambulatory Visit: Payer: Self-pay

## 2018-11-27 ENCOUNTER — Telehealth: Payer: Self-pay | Admitting: Internal Medicine

## 2018-11-27 DIAGNOSIS — Z20828 Contact with and (suspected) exposure to other viral communicable diseases: Secondary | ICD-10-CM | POA: Diagnosis not present

## 2018-11-27 DIAGNOSIS — Z01812 Encounter for preprocedural laboratory examination: Secondary | ICD-10-CM | POA: Diagnosis not present

## 2018-11-27 LAB — SARS CORONAVIRUS 2 (TAT 6-24 HRS): SARS Coronavirus 2: NEGATIVE

## 2018-11-27 NOTE — Telephone Encounter (Signed)
Ok.  Thank you for the update.  [name], MD [facility] Pager: [phone]

## 2018-11-27 NOTE — Telephone Encounter (Signed)
Wife wants Dr. Saunders Revel to know patient has upcoming angio with Dr. Delana Meyer 10/20

## 2018-11-30 ENCOUNTER — Telehealth: Payer: Self-pay

## 2018-11-30 ENCOUNTER — Other Ambulatory Visit (INDEPENDENT_AMBULATORY_CARE_PROVIDER_SITE_OTHER): Payer: Self-pay | Admitting: Nurse Practitioner

## 2018-11-30 NOTE — Telephone Encounter (Signed)
-----   Message from Garrel Ridgel, [place] sent at 11/23/2018  6:59 AM EDT ----- Abnormal abi's and patient should follow up with vascular Doc for eval and tx.

## 2018-11-30 NOTE — Telephone Encounter (Signed)
I spoke with patient's wife, patient is scheduled for angioplasty tomorrow with Dr. Delana Meyer.

## 2018-12-01 ENCOUNTER — Encounter
Admission: RE | Disposition: A | Discharge status: Home / Self Care | Payer: Self-pay | Source: Home / Self Care | Attending: Vascular Surgery

## 2018-12-01 ENCOUNTER — Other Ambulatory Visit: Payer: Self-pay

## 2018-12-01 ENCOUNTER — Ambulatory Visit
Admission: RE | Admit: 2018-12-01 | Discharge: 2018-12-01 | Disposition: A | Discharge status: Home / Self Care | Payer: Medicare Other | Attending: Vascular Surgery | Admitting: Vascular Surgery

## 2018-12-01 DIAGNOSIS — I70223 Atherosclerosis of native arteries of extremities with rest pain, bilateral legs: Secondary | ICD-10-CM | POA: Diagnosis not present

## 2018-12-01 DIAGNOSIS — Z794 Long term (current) use of insulin: Secondary | ICD-10-CM | POA: Insufficient documentation

## 2018-12-01 DIAGNOSIS — N1832 Chronic kidney disease, stage 3b: Secondary | ICD-10-CM | POA: Insufficient documentation

## 2018-12-01 DIAGNOSIS — E782 Mixed hyperlipidemia: Secondary | ICD-10-CM | POA: Diagnosis not present

## 2018-12-01 DIAGNOSIS — Z87891 Personal history of nicotine dependence: Secondary | ICD-10-CM | POA: Diagnosis not present

## 2018-12-01 DIAGNOSIS — Z955 Presence of coronary angioplasty implant and graft: Secondary | ICD-10-CM | POA: Insufficient documentation

## 2018-12-01 DIAGNOSIS — M109 Gout, unspecified: Secondary | ICD-10-CM | POA: Diagnosis not present

## 2018-12-01 DIAGNOSIS — I2510 Atherosclerotic heart disease of native coronary artery without angina pectoris: Secondary | ICD-10-CM | POA: Diagnosis not present

## 2018-12-01 DIAGNOSIS — Z7982 Long term (current) use of aspirin: Secondary | ICD-10-CM | POA: Insufficient documentation

## 2018-12-01 DIAGNOSIS — I252 Old myocardial infarction: Secondary | ICD-10-CM | POA: Insufficient documentation

## 2018-12-01 DIAGNOSIS — Z8249 Family history of ischemic heart disease and other diseases of the circulatory system: Secondary | ICD-10-CM | POA: Insufficient documentation

## 2018-12-01 DIAGNOSIS — M1990 Unspecified osteoarthritis, unspecified site: Secondary | ICD-10-CM | POA: Diagnosis not present

## 2018-12-01 DIAGNOSIS — K219 Gastro-esophageal reflux disease without esophagitis: Secondary | ICD-10-CM | POA: Insufficient documentation

## 2018-12-01 DIAGNOSIS — Z79899 Other long term (current) drug therapy: Secondary | ICD-10-CM | POA: Insufficient documentation

## 2018-12-01 DIAGNOSIS — Z880 Allergy status to penicillin: Secondary | ICD-10-CM | POA: Insufficient documentation

## 2018-12-01 DIAGNOSIS — I129 Hypertensive chronic kidney disease with stage 1 through stage 4 chronic kidney disease, or unspecified chronic kidney disease: Secondary | ICD-10-CM | POA: Diagnosis not present

## 2018-12-01 DIAGNOSIS — Z89421 Acquired absence of other right toe(s): Secondary | ICD-10-CM | POA: Insufficient documentation

## 2018-12-01 DIAGNOSIS — E1122 Type 2 diabetes mellitus with diabetic chronic kidney disease: Secondary | ICD-10-CM | POA: Diagnosis not present

## 2018-12-01 DIAGNOSIS — Z7902 Long term (current) use of antithrombotics/antiplatelets: Secondary | ICD-10-CM | POA: Insufficient documentation

## 2018-12-01 DIAGNOSIS — I70229 Atherosclerosis of native arteries of extremities with rest pain, unspecified extremity: Secondary | ICD-10-CM

## 2018-12-01 DIAGNOSIS — Z833 Family history of diabetes mellitus: Secondary | ICD-10-CM | POA: Diagnosis not present

## 2018-12-01 HISTORY — PX: LOWER EXTREMITY ANGIOGRAPHY: CATH118251

## 2018-12-01 LAB — BUN: BUN: 39 mg/dL — ABNORMAL HIGH (ref 8–23)

## 2018-12-01 LAB — GLUCOSE, CAPILLARY
Glucose-Capillary: 111 mg/dL — ABNORMAL HIGH (ref 70–99)
Glucose-Capillary: 130 mg/dL — ABNORMAL HIGH (ref 70–99)

## 2018-12-01 LAB — CREATININE, SERUM
Creatinine, Ser: 1.63 mg/dL — ABNORMAL HIGH (ref 0.61–1.24)
GFR calc Af Amer: 44 mL/min — ABNORMAL LOW (ref >60)
GFR calc non Af Amer: 38 mL/min — ABNORMAL LOW (ref >60)

## 2018-12-01 SURGERY — LOWER EXTREMITY ANGIOGRAPHY
Anesthesia: Moderate Sedation | Site: Leg Lower | Laterality: Right

## 2018-12-01 MED ORDER — HYDRALAZINE HCL 20 MG/ML IJ SOLN: 5.0000 mg | INTRAMUSCULAR | Status: DC | PRN

## 2018-12-01 MED ORDER — NITROGLYCERIN 1 MG/10 ML FOR IR/CATH LAB
INTRA_ARTERIAL | Status: DC | PRN
  Administered 2018-12-01: 300 ug
  Administered 2018-12-01: 500 ug

## 2018-12-01 MED ORDER — LABETALOL HCL 5 MG/ML IV SOLN: 10.0000 mg | INTRAVENOUS | Status: DC | PRN

## 2018-12-01 MED ORDER — HYDRALAZINE HCL 20 MG/ML IJ SOLN
INTRAMUSCULAR | Status: AC
  Filled 2018-12-01: qty 1

## 2018-12-01 MED ORDER — SODIUM CHLORIDE 0.9 % IV SOLN
INTRAVENOUS | Status: DC
  Administered 2018-12-01: 07:00:00 via INTRAVENOUS

## 2018-12-01 MED ORDER — HEPARIN SODIUM (PORCINE) 1000 UNIT/ML IJ SOLN
INTRAMUSCULAR | Status: AC
  Filled 2018-12-01: qty 1

## 2018-12-01 MED ORDER — CLINDAMYCIN PHOSPHATE 300 MG/50ML IV SOLN
INTRAVENOUS | Status: AC
  Administered 2018-12-01: 300 mg via INTRAVENOUS
  Filled 2018-12-01: qty 50

## 2018-12-01 MED ORDER — ONDANSETRON HCL 4 MG/2ML IJ SOLN: 4.0000 mg | Freq: Four times a day (QID) | INTRAMUSCULAR | Status: DC | PRN

## 2018-12-01 MED ORDER — ACETAMINOPHEN 325 MG PO TABS: 650.0000 mg | ORAL_TABLET | ORAL | Status: DC | PRN

## 2018-12-01 MED ORDER — NITROGLYCERIN 1 MG/10 ML FOR IR/CATH LAB
INTRA_ARTERIAL | Status: AC
  Filled 2018-12-01: qty 10

## 2018-12-01 MED ORDER — MIDAZOLAM HCL 2 MG/2ML IJ SOLN
INTRAMUSCULAR | Status: DC | PRN
  Administered 2018-12-01: 1 mg via INTRAVENOUS
  Administered 2018-12-01 (×2): 0.5 mg via INTRAVENOUS
  Administered 2018-12-01: 2 mg via INTRAVENOUS

## 2018-12-01 MED ORDER — FENTANYL CITRATE (PF) 100 MCG/2ML IJ SOLN
INTRAMUSCULAR | Status: AC
  Filled 2018-12-01: qty 2

## 2018-12-01 MED ORDER — MIDAZOLAM HCL 5 MG/5ML IJ SOLN
INTRAMUSCULAR | Status: AC
  Filled 2018-12-01: qty 5

## 2018-12-01 MED ORDER — OXYCODONE HCL 5 MG PO TABS: 5.0000 mg | ORAL_TABLET | ORAL | Status: DC | PRN

## 2018-12-01 MED ORDER — CEFAZOLIN SODIUM-DEXTROSE 2-4 GM/100ML-% IV SOLN
INTRAVENOUS | Status: AC
  Filled 2018-12-01: qty 100

## 2018-12-01 MED ORDER — MIDAZOLAM HCL 2 MG/ML PO SYRP: 8.0000 mg | ORAL_SOLUTION | Freq: Once | ORAL | Status: DC | PRN

## 2018-12-01 MED ORDER — SODIUM CHLORIDE 0.9% FLUSH: 3.0000 mL | INTRAVENOUS | Status: DC | PRN

## 2018-12-01 MED ORDER — IODIXANOL 320 MG/ML IV SOLN
INTRAVENOUS | Status: DC | PRN
  Administered 2018-12-01: 11:00:00 85 mL via INTRA_ARTERIAL

## 2018-12-01 MED ORDER — HYDRALAZINE HCL 20 MG/ML IJ SOLN
INTRAMUSCULAR | Status: DC | PRN
  Administered 2018-12-01 (×2): 10 mg via INTRAVENOUS

## 2018-12-01 MED ORDER — SODIUM CHLORIDE 0.9 % IV SOLN: INTRAVENOUS | Status: DC

## 2018-12-01 MED ORDER — LABETALOL HCL 5 MG/ML IV SOLN: 5.0000 mg | Freq: Once | INTRAVENOUS | Status: DC

## 2018-12-01 MED ORDER — FENTANYL CITRATE (PF) 100 MCG/2ML IJ SOLN
INTRAMUSCULAR | Status: DC | PRN
  Administered 2018-12-01 (×3): 25 ug via INTRAVENOUS
  Administered 2018-12-01: 50 ug via INTRAVENOUS

## 2018-12-01 MED ORDER — DIPHENHYDRAMINE HCL 50 MG/ML IJ SOLN: 50.0000 mg | Freq: Once | INTRAMUSCULAR | Status: DC | PRN

## 2018-12-01 MED ORDER — HEPARIN SODIUM (PORCINE) 1000 UNIT/ML IJ SOLN
INTRAMUSCULAR | Status: DC | PRN
  Administered 2018-12-01: 5000 [IU] via INTRAVENOUS

## 2018-12-01 MED ORDER — FAMOTIDINE 20 MG PO TABS: 40.0000 mg | ORAL_TABLET | Freq: Once | ORAL | Status: DC | PRN

## 2018-12-01 MED ORDER — HYDROMORPHONE HCL 1 MG/ML IJ SOLN: 1.0000 mg | Freq: Once | INTRAMUSCULAR | Status: DC | PRN

## 2018-12-01 MED ORDER — MORPHINE SULFATE (PF) 4 MG/ML IV SOLN: 2.0000 mg | INTRAVENOUS | Status: DC | PRN

## 2018-12-01 MED ORDER — SODIUM CHLORIDE 0.9 % IV SOLN: 250.0000 mL | INTRAVENOUS | Status: DC | PRN

## 2018-12-01 MED ORDER — METHYLPREDNISOLONE SODIUM SUCC 125 MG IJ SOLR: 125.0000 mg | Freq: Once | INTRAMUSCULAR | Status: DC | PRN

## 2018-12-01 MED ORDER — SODIUM CHLORIDE 0.9% FLUSH: 3.0000 mL | Freq: Two times a day (BID) | INTRAVENOUS | Status: DC

## 2018-12-01 MED ORDER — CLINDAMYCIN PHOSPHATE 300 MG/50ML IV SOLN
300.0000 mg | Freq: Once | INTRAVENOUS | Status: AC
  Administered 2018-12-01: 08:00:00 300 mg via INTRAVENOUS

## 2018-12-01 SURGICAL SUPPLY — 46 items
BALLN LUTONIX 018 4X220X130 (BALLOONS) ×2
BALLN LUTONIX 018 4X40X130 (BALLOONS) ×2
BALLN LUTONIX 018 5X60X130 (BALLOONS) ×2
BALLN LUTONIX 018 6X100X130 (BALLOONS) ×2
BALLN LUTONIX 018 6X300X130 (BALLOONS) ×2
BALLN LUTONIX 7X80X130 (BALLOONS) ×4
BALLN LUTONIX DCB 7X40X130 (BALLOONS) ×2
BALLN MUSTANG 7.0X20 75 (BALLOONS) ×2
BALLN ULTRVRSE 3X300X150 (BALLOONS) ×1
BALLN ULTRVRSE 3X300X150 OTW (BALLOONS) ×1
BALLOON LUTONIX 018 4X220X130 (BALLOONS) IMPLANT
BALLOON LUTONIX 018 4X40X130 (BALLOONS) IMPLANT
BALLOON LUTONIX 018 5X60X130 (BALLOONS) IMPLANT
BALLOON LUTONIX 018 6X100X130 (BALLOONS) IMPLANT
BALLOON LUTONIX 018 6X300X130 (BALLOONS) IMPLANT
BALLOON LUTONIX 7X80X130 (BALLOONS) IMPLANT
BALLOON LUTONIX DCB 7X40X130 (BALLOONS) IMPLANT
BALLOON MUSTANG 7.0X20 75 (BALLOONS) IMPLANT
BALLOON ULTRVRSE 3X300X150 OTW (BALLOONS) IMPLANT
CANNULA 5F STIFF (CANNULA) ×2 IMPLANT
CATH BEACON 5 .035 65 KMP TIP (CATHETERS) ×1 IMPLANT
CATH CROSSER 14S OTW 146CM (CATHETERS) ×1 IMPLANT
CATH PIG 70CM (CATHETERS) ×1 IMPLANT
CATH SIDEKICK ANG TAPER 110 (SHEATH) ×1 IMPLANT
CATH VERT 5FR 125CM (CATHETERS) ×1 IMPLANT
COVER PROBE U/S 5X48 (MISCELLANEOUS) ×1 IMPLANT
DEVICE PRESTO INFLATION (MISCELLANEOUS) ×1 IMPLANT
DEVICE STARCLOSE SE CLOSURE (Vascular Products) ×1 IMPLANT
DRAPE TABLE BACK 80X90 (DRAPES) ×1 IMPLANT
GLIDEWIRE ADV .035X260CM (WIRE) ×1 IMPLANT
GUIDEWIRE SUPER STIFF .035X180 (WIRE) ×1 IMPLANT
NDL ENTRY 21GA 7CM ECHOTIP (NEEDLE) IMPLANT
NEEDLE ENTRY 21GA 7CM ECHOTIP (NEEDLE) ×2 IMPLANT
PACK ANGIOGRAPHY (CUSTOM PROCEDURE TRAY) ×2 IMPLANT
SET INTRO CAPELLA COAXIAL (SET/KITS/TRAYS/PACK) ×1 IMPLANT
SHEATH BRITE TIP 5FRX11 (SHEATH) ×1 IMPLANT
SHEATH BRITE TIP 6FRX11 (SHEATH) ×1 IMPLANT
SHEATH RAABE 7FR (SHEATH) ×1 IMPLANT
STENT LIFESTENT 5F 7X100X135 (Permanent Stent) ×1 IMPLANT
STENT VIABAHN 8X2.5X120 (Permanent Stent) IMPLANT
STENT VIABAHN 8X25X120 (Permanent Stent) ×1 IMPLANT
SYR MEDRAD MARK 7 150ML (SYRINGE) ×1 IMPLANT
TUBING CONTRAST HIGH PRESS 72 (TUBING) ×1 IMPLANT
WIRE J 3MM .035X145CM (WIRE) ×1 IMPLANT
WIRE ROSEN-J .035X260CM (WIRE) ×1 IMPLANT
WIRE RUNTHROUGH .014X300CM (WIRE) ×1 IMPLANT

## 2018-12-01 NOTE — Op Note (Signed)
[facility] Percutaneous Study/Intervention Procedural Note   Date of Surgery: 12/01/2018  Surgeon:  [name], MD.  Pre-operative Diagnosis: Atherosclerotic occlusive disease bilateral lower extremities with rest pain bilateral lower extremities; complication vascular device with occlusion of the previously placed stents  Post-operative diagnosis: Same  Procedure(s) Performed: 1. Introduction catheter into right lower extremity 3rd order catheter placement  2. Contrast injection right lower extremity for distal runoff   3. Crosser atherectomy of the right SFA and popliteal arteries 4.  Percutaneous transluminal angioplasty and stent placement right superficial femoral artery and popliteal             5.  Crosser atherectomy of the right peroneal artery                         6.     Percutaneous transluminal angioplasty of the right peroneal artery                        7.   Star close closure left common femoral arteriotomy  Anesthesia: Conscious sedation was administered under my direct supervision by the interventional radiology RN. IV Versed plus fentanyl were utilized. Continuous ECG, pulse oximetry and blood pressure was monitored throughout the entire procedure. Conscious sedation was for a total of 145 minutes.  Sheath: 7 [place] Raby left common femoral retrograde  Contrast: 85 cc  Fluoroscopy Time: 29.1 minutes  Indications: Peter [name] presents with rest pain both lower extremities.  He is new to the practice but gives a history of multiple prior interventions at an outside institution.  Physical examination as well as noninvasive studies support ischemia of the lower extremities with the right foot being worse than the left.  This represents a possible limb loss situation.  The risks and benefits are reviewed all questions answered patient agrees to proceed with angiography in the  hope for intervention for limb salvage right leg will be addressed first.  Procedure: Peter [name] is a 85 y.o. y.o. male who was identified and appropriate procedural time out was performed. The patient was then placed supine on the table and prepped and draped in the usual sterile fashion.   Ultrasound was placed in the sterile sleeve and the left groin was evaluated the left common femoral artery was echolucent and pulsatile indicating patency.  Image was recorded for the permanent record and under real-time visualization a microneedle was inserted into the common femoral artery microwire followed by a micro-sheath.  An Amplatz was then advanced through the micro-sheath and a  5 [place] sheath was then inserted over an Amplatz wire.  The Amplatz wire was then advanced and a 5 French pigtail catheter was positioned at the level of T12. AP projection of the aorta was then obtained. Pigtail catheter was repositioned to above the bifurcation and a LAO view of the pelvis was obtained.  Subsequently a pigtail catheter with the advantage Glidewire was used to cross the aortic bifurcation the catheter wire were advanced down into the right distal external iliac artery. Oblique view of the femoral bifurcation was then obtained and subsequently the wire was reintroduced and the pigtail catheter negotiated into the SFA representing third order catheter placement. Distal runoff was then performed.  Diagnostic interpretation: The abdominal aorta is opacified with a bolus injection of contrast.  There are no hemodynamically significant stenoses identified.  Single renal arteries are noted with normal size nephrograms.  No evidence of  hemodynamically significant renal artery stenosis.  The aortic bifurcation is widely patent.  The common iliac arteries are widely patent.  The external iliac artery on the right is extremely tortuous and there appears to be a moderate 30 to 40% stenosis involved with one of the  curves but this does not appear to be hemodynamically significant (patient maintains a 4+ palpable right femoral pulse).  Left external iliac artery is widely patent.  The right common femoral and profunda femoris are widely patent and there are many collaterals from the profunda femoris coursing distally.  The SFA is patent in its proximal 10 to 15 cm.  There is a stent placed in the SFA near the origin.  This is patent on the initial images.  Beginning at about the mid SFA there are multiple stents some overlapping and what appear to be stents within stents.  These extend down through the entirety of the popliteal to approximately a level that would be the tibioperoneal trunk.  The stents are all occluded in their entirety.  There is nonvisualization of the tibioperoneal trunk or the trifurcation.  There is nonvisualization of the posterior tibial and the anterior tibial in their entirety.  The peroneal appears to reconstitute in its midportion and is widely patent from this level down to the ankle where it demonstrates the typical upside down Y-shaped collaterals to the foot there does appear to be filling of the pedal arch.  In other words the distal half of the SFA the popliteal in its entirety the tibioperoneal trunk the anterior tibial and posterior tibial are all occluded in their entirety.  The peroneal is occluded down to its midpoint.  Based on these findings I elected to move forward with intervention  5000 units of heparin was then given and allowed to circulate and a 7 [place] Raby sheath was advanced up and over the bifurcation and positioned in the superficial femoral artery.  At this point it was noted that the sheath was having difficulty crossing the short proximal SFA stent.  Under magnified imaging it appears the stent is quite irregular in its proximal portion, this deformity appears to be either a fracture or a crush.  Ultimately using a Rosen wire with a Kumpe catheter I was able to  cross the stent and was able to advance the 7 French sheath several centimeters beyond this proximal stent.  The 58 S Crosser catheter was then prepped on the field and a angled side kick catheter was advanced into the cul-de-sac of the SFA under magnified imaging in the LAO projection. Using the Crosser catheter the occlusion of the SFA and popliteal was negotiated.  The detector was then repositioned and the side kick catheter advanced down to the level of the distal popliteal proximal tibioperoneal trunk the 14 S crosser was then used to negotiate across the tibioperoneal trunk and down into the distal peroneal artery.  Hand injection of contrast confirmed intraluminal positioning in the distal one third of the peroneal.  A 0.014 run-through wire was then advanced through the Crosser side port and the crosser catheter was removed.    A 3 mm x 30 cm balloon was then used to angioplasty the peroneal artery as well as the tibioperoneal trunk and distal popliteal artery. Inflations were to 10-12 atm for 1 full minute. Follow-up imaging demonstrated patency of the peroneal artery but there were several areas of significant greater than 50% residual stenosis.  The 3 mm balloon was then used to angioplasty the SFA  and more proximal popliteal.  I then elected to move forward with angioplasty from the SFA more distally.  A 6 mm x 30 cm Lutonix drug-eluting balloon was used to angioplasty the superficial femoral and popliteal arteries. Inflations were to 12 atmospheres for 2 minutes.  Next a 6 mm x 100 mm Lutonix drug-eluting balloon was used to angioplasty the more distal popliteal.  Lastly a 5 mm x 80 mm Lutonix drug-eluting balloon was used to angioplasty the distalmost popliteal as well as the tibioperoneal trunk.  These inflations were to 12 to 14 atm for 1 to 2 minutes each.  Follow-up imaging demonstrated patency throughout the distal SFA the entirety of the popliteal and the tibioperoneal trunk with less than  20% residual stenosis.  However significant stenosis remained at several spots in the peroneal and a 4 mm x 100 mm Lutonix drug-eluting balloon was advanced back into the peroneal and inflated to 6 atm for 1 full minute.  Follow-up imaging now demonstrated wide patency of the peroneal with less than 5% residual stenosis disks except for 1 small area and a 4 x 40 mm balloon was advanced across this and inflated to 4 atm for 1 minute.  Follow-up imaging now demonstrated wide patency of the peroneal throughout its entirety there was some distal spasm noted particularly when compared to the original images and therefore 600 mcg of nitroglycerin was injected intra-arterially.  Follow-up imaging now demonstrated widely patent tibioperoneal trunk and peroneal with preservation of the collaterals crossing the foot and less than 5% residual stenosis throughout.  The detector was then repositioned in the more proximal SFA was imaged as this had not been imaged since the original balloon inflation.  Several areas of greater than 40% residual stenosis in the proximal stents were noted including one right at the leading edge of the stent.  A 7 mm Lutonix balloon was then advanced across these areas inflated to 12 atm for 2 minutes.  Follow-up imaging demonstrated persistent residual stenosis and therefore a 7 mm x 100 mm life stent was deployed across these 2 lesions.  A 7 mm x 80 mm Lutonix drug-eluting balloon was then used to post dilate the stent inflation was to 10 atm for minimum of 30 seconds.  Follow-up imaging now demonstrated less than 10% residual stenosis throughout the SFA stent and attention was turned to the more proximal stent that appeared to be deformed earlier in the case.  A 8 mm x 25 mm Viabahn was deployed across the deformed area and postdilated to 7 mm.  Follow-up imaging demonstrated complete resolution of this narrowing and successful exclusion of the previous stent with less than 5% residual  stenosis.  Distal runoff was preserved.  After review of these images the sheath is pulled into the left external iliac oblique of the common femoral is obtained and a Star close device deployed. There no immediate Complications.  Findings:  The abdominal aorta is opacified with a bolus injection of contrast.  There are no hemodynamically significant stenoses identified.  Single renal arteries are noted with normal size nephrograms.  No evidence of hemodynamically significant renal artery stenosis.  The aortic bifurcation is widely patent.  The common iliac arteries are widely patent.  The external iliac artery on the right is extremely tortuous and there appears to be a moderate 30 to 40% stenosis involved with one of the curves but this does not appear to be hemodynamically significant (patient maintains a 4+ palpable right femoral pulse).  Left  external iliac artery is widely patent.  The right common femoral and profunda femoris are widely patent and there are many collaterals from the profunda femoris coursing distally.  The SFA is patent in its proximal 10 to 15 cm.  There is a stent placed in the SFA near the origin.  This is patent on the initial images.  Beginning at about the mid SFA there are multiple stents some overlapping and what appear to be stents within stents.  These extend down through the entirety of the popliteal to approximately a level that would be the tibioperoneal trunk.  The stents are all occluded in their entirety.  There is nonvisualization of the tibioperoneal trunk or the trifurcation.  There is nonvisualization of the posterior tibial and the anterior tibial in their entirety.  The peroneal appears to reconstitute in its midportion and is widely patent from this level down to the ankle where it demonstrates the typical upside down Y-shaped collaterals to the foot there does appear to be filling of the pedal arch.  In other words the distal half of the SFA the popliteal in  its entirety the tibioperoneal trunk the anterior tibial and posterior tibial are all occluded in their entirety.  The peroneal is occluded down to its midpoint.  Following crosser atherectomy there is successful recanalization of the SFA popliteal tibioperoneal trunk and peroneal artery.  Following balloon angioplasty to the peroneal and tibioperoneal trunk maximal diameter and the peroneal was 4 mm and in the tibioperoneal trunk was 5.  Follow-up imaging demonstrates less than 5% residual stenosis throughout its entirety with preservation of its distal runoff.  Following angioplasty of the SFA there is inadequate resolution of the stenosis more proximally and a life stent is deployed and postdilated to 7 mm.  There is also a stent deployed across the original stent deformity and postdilated to 7 mm.  This yields a widely patent SFA and popliteal with less than 10% residual stenosis throughout their course.  Disposition: Patient was taken to the recovery room in stable condition having tolerated the procedure well.  Belenda Cruise Giankarlo Leamer 12/01/2018,10:47 AM

## 2018-12-01 NOTE — H&P (Signed)
[facility] History & Physical Update  The patient was interviewed and re-examined.  The patient's previous History and Physical has been reviewed and is unchanged.  There is no change in the plan of care. We plan to proceed with the scheduled procedure.  [name], MD  12/01/2018, 7:58 AM

## 2018-12-01 NOTE — Discharge Instructions (Signed)
Moderate Conscious Sedation, Adult, Care After °These instructions provide you with information about caring for yourself after your procedure. Your health care provider may also give you more specific instructions. Your treatment has been planned according to current medical practices, but problems sometimes occur. Call your health care provider if you have any problems or questions after your procedure. °What can I expect after the procedure? °After your procedure, it is common: °· To feel sleepy for several hours. °· To feel clumsy and have poor balance for several hours. °· To have poor judgment for several hours. °· To vomit if you eat too soon. °Follow these instructions at home: °For at least 24 hours after the procedure: ° °· Do not: °? Participate in activities where you could fall or become injured. °? Drive. °? Use heavy machinery. °? Drink alcohol. °? Take sleeping pills or medicines that cause drowsiness. °? Make important decisions or sign legal documents. °? Take care of children on your own. °· Rest. °Eating and drinking °· Follow the diet recommended by your health care provider. °· If you vomit: °? Drink water, juice, or soup when you can drink without vomiting. °? Make sure you have little or no nausea before eating solid foods. °General instructions °· Have a responsible adult stay with you until you are awake and alert. °· Take over-the-counter and prescription medicines only as told by your health care provider. °· If you smoke, do not smoke without supervision. °· Keep all follow-up visits as told by your health care provider. This is important. °Contact a health care provider if: °· You keep feeling nauseous or you keep vomiting. °· You feel light-headed. °· You develop a rash. °· You have a fever. °Get help right away if: °· You have trouble breathing. °This information is not intended to replace advice given to you by your health care provider. Make sure you discuss any questions you have  with your health care provider. °Document Released: 11/18/2012 Document Revised: 01/10/2017 Document Reviewed: 05/20/2015 °Elsevier Patient Education © 2020 [employer] °Femoral Site Care °This sheet gives you information about how to care for yourself after your procedure. Your health care provider may also give you more specific instructions. If you have problems or questions, contact your health care provider. °What can I expect after the procedure? °After the procedure, it is common to have: °· Bruising that usually fades within 1-2 weeks. °· Tenderness at the site. °Follow these instructions at home: °Wound care °· Follow instructions from your health care provider about how to take care of your insertion site. Make sure you: °? Wash your hands with soap and water before you change your bandage (dressing). If soap and water are not available, use hand sanitizer. °? Change your dressing as told by your health care provider. °? Leave stitches (sutures), skin glue, or adhesive strips in place. These skin closures may need to stay in place for 2 weeks or longer. If adhesive strip edges start to loosen and curl up, you may trim the loose edges. Do not remove adhesive strips completely unless your health care provider tells you to do that. °· Do not take baths, swim, or use a hot tub until your health care provider approves. °· You may shower 24-48 hours after the procedure or as told by your health care provider. °? Gently wash the site with plain soap and water. °? Pat the area dry with a clean towel. °? Do not rub the site. This may cause bleeding. °·   Do not apply powder or lotion to the site. Keep the site clean and dry. °· Check your femoral site every day for signs of infection. Check for: °? Redness, swelling, or pain. °? Fluid or blood. °? Warmth. °? Pus or a bad smell. °Activity °· For the first 2-3 days after your procedure, or as long as directed: °? Avoid climbing stairs as much as possible. °? Do not  squat. °· Do not lift anything that is heavier than 10 lb (4.5 kg), or the limit that you are told, until your health care provider says that it is safe. °· Rest as directed. °? Avoid sitting for a long time without moving. Get up to take short walks every 1-2 hours. °· Do not drive for 24 hours if you were given a medicine to help you relax (sedative). °General instructions °· Take over-the-counter and prescription medicines only as told by your health care provider. °· Keep all follow-up visits as told by your health care provider. This is important. °Contact a health care provider if you have: °· A fever or chills. °· You have redness, swelling, or pain around your insertion site. °Get help right away if: °· The catheter insertion area swells very fast. °· You pass out. °· You suddenly start to sweat or your skin gets clammy. °· The catheter insertion area is bleeding, and the bleeding does not stop when you hold steady pressure on the area. °· The area near or just beyond the catheter insertion site becomes pale, cool, tingly, or numb. °These symptoms may represent a serious problem that is an emergency. Do not wait to see if the symptoms will go away. Get medical help right away. Call your local emergency services (911 in the [place] Do not drive yourself to the hospital. °Summary °· After the procedure, it is common to have bruising that usually fades within 1-2 weeks. °· Check your femoral site every day for signs of infection. °· Do not lift anything that is heavier than 10 lb (4.5 kg), or the limit that you are told, until your health care provider says that it is safe. °This information is not intended to replace advice given to you by your health care provider. Make sure you discuss any questions you have with your health care provider. °Document Released: 10/01/2013 Document Revised: 02/10/2017 Document Reviewed: 02/10/2017 °Elsevier Patient Education © 2020 [employer] Inc[employer] °Angiogram, Care After °This  sheet gives you information about how to care for yourself after your procedure. Your doctor may also give you more specific instructions. If you have problems or questions, contact your doctor. °Follow these instructions at home: °Insertion site care °· Follow instructions from your doctor about how to take care of your long, thin tube (catheter) insertion area. Make sure you: °? Wash your hands with soap and water before you change your bandage (dressing). If you cannot use soap and water, use hand sanitizer. °? Change your bandage as told by your doctor. °? Leave stitches (sutures), skin glue, or skin tape (adhesive) strips in place. They may need to stay in place for 2 weeks or longer. If tape strips get loose and curl up, you may trim the loose edges. Do not remove tape strips completely unless your doctor says it is okay. °· Do not take baths, swim, or use a hot tub until your doctor says it is okay. °· You may shower 24-48 hours after the procedure or as told by your doctor. °? Gently wash the area with   plain soap and water. °? Pat the area dry with a clean towel. °? Do not rub the area. This may cause bleeding. °· Do not apply powder or lotion to the area. Keep the area clean and dry. °· Check your insertion area every day for signs of infection. Check for: °? More redness, swelling, or pain. °? Fluid or blood. °? Warmth. °? Pus or a bad smell. °Activity °· Rest as told by your doctor, usually for 1-2 days. °· Do not lift anything that is heavier than 10 lbs. (4.5 kg) or as told by your doctor. °· Do not drive for 24 hours if you were given a medicine to help you relax (sedative). °· Do not drive or use heavy machinery while taking prescription pain medicine. °General instructions ° °· Go back to your normal activities as told by your doctor, usually in about a week. Ask your doctor what activities are safe for you. °· If the insertion area starts to bleed, lie flat and put pressure on the area. If the  bleeding does not stop, get help right away. This is an emergency. °· Drink enough fluid to keep your pee (urine) clear or pale yellow. °· Take over-the-counter and prescription medicines only as told by your doctor. °· Keep all follow-up visits as told by your doctor. This is important. °Contact a doctor if: °· You have a fever. °· You have chills. °· You have more redness, swelling, or pain around your insertion area. °· You have fluid or blood coming from your insertion area. °· The insertion area feels warm to the touch. °· You have pus or a bad smell coming from your insertion area. °· You have more bruising around the insertion area. °· Blood collects in the tissue around the insertion area (hematoma) that may be painful to the touch. °Get help right away if: °· You have a lot of pain in the insertion area. °· The insertion area swells very fast. °· The insertion area is bleeding, and the bleeding does not stop after holding steady pressure on the area. °· The area near or just beyond the insertion area becomes pale, cool, tingly, or numb. °These symptoms may be an emergency. Do not wait to see if the symptoms will go away. Get medical help right away. Call your local emergency services (911 in the [place] Do not drive yourself to the hospital. °Summary °· After the procedure, it is common to have bruising and tenderness at the long, thin tube insertion area. °· After the procedure, it is important to rest and drink plenty of fluids. °· Do not take baths, swim, or use a hot tub until your doctor says it is okay to do so. You may shower 24-48 hours after the procedure or as told by your doctor. °· If the insertion area starts to bleed, lie flat and put pressure on the area. If the bleeding does not stop, get help right away. This is an emergency. °This information is not intended to replace advice given to you by your health care provider. Make sure you discuss any questions you have with your health care  provider. °Document Released: 04/26/2008 Document Revised: 01/10/2017 Document Reviewed: 01/23/2016 °Elsevier Patient Education © 2020 Elsevier Inc. ° °

## 2018-12-07 ENCOUNTER — Encounter: Payer: Self-pay | Admitting: Family Medicine

## 2018-12-07 ENCOUNTER — Other Ambulatory Visit: Payer: Self-pay

## 2018-12-07 ENCOUNTER — Ambulatory Visit (INDEPENDENT_AMBULATORY_CARE_PROVIDER_SITE_OTHER): Payer: Medicare Other | Admitting: Family Medicine

## 2018-12-07 VITALS — BP 138/58 | HR 61 | Temp 98.0°F | Ht 67.0 in | Wt 177.1 lb

## 2018-12-07 DIAGNOSIS — T24202A Burn of second degree of unspecified site of left lower limb, except ankle and foot, initial encounter: Secondary | ICD-10-CM

## 2018-12-07 DIAGNOSIS — I70223 Atherosclerosis of native arteries of extremities with rest pain, bilateral legs: Secondary | ICD-10-CM | POA: Diagnosis not present

## 2018-12-07 MED ORDER — SILVER SULFADIAZINE 1 % EX CREA: 1.0000 "application " | TOPICAL_CREAM | Freq: Every day | CUTANEOUS | 0 refills | Status: DC

## 2018-12-07 NOTE — Patient Instructions (Signed)
Good to see you today  Finish your antibiotic  Apply silver sulfadine once a day, keep clean and dry  Notify office if increased redness, pus or fevers   Burn Care, Adult A burn is an injury to the skin or the tissues under the skin. There are three types of burns:  First degree. These burns may cause the skin to be red and slightly swollen.  Second degree. These burns are very painful and cause the skin to be very red. The skin may also leak fluid, look shiny, and develop blisters.  Third degree. These burns cause permanent damage. They either turn the skin white or black and make it look charred, dry, and leathery. Taking care of your burn properly can help to prevent pain and infection. It can also help the burn to heal more quickly. What are the risks? Complications from burns include:  Damage to the skin.  Reduced blood flow near the injury.  Dead tissue.  Scarring.  Problems with movement, if the burn happened near a joint or on the hands or feet. Severe burns can lead to problems that affect the whole body, such as:  Fluid loss.  Less blood circulating in the body.  Inability to maintain a normal core body temperature (thermoregulation).  Infection.  Shock.  Problems breathing. How to care for a first-degree burn Right after a burn:  Rinse or soak the burn under cool water until the pain stops. Do not put ice on your burn. This can cause more damage.  Lightly cover the burn with a sterile cloth (dressing). Burn care  Follow instructions from your health care provider about: ? How to clean and take care of the burn. ? When to change and remove the dressing.  Check your burn every day for signs of infection. Check for: ? More redness, swelling, or pain. ? Warmth. ? Pus or a bad smell. Medicine  Take over-the-counter and prescription medicines only as told by your health care provider.  If you were prescribed antibiotic medicine, take or apply it as  told by your health care provider. Do not stop using the antibiotic even if your condition improves. General instructions  To prevent infection, do not put butter, oil, or other home remedies on your burn.  Do not rub your burn, even when you are cleaning it.  Protect your burn from the sun. How to care for a second-degree burn Right after a burn:  Rinse or soak the burn under cool water. Do this for several minutes. Do not put ice on your burn. This can cause more damage.  Lightly cover the burn with a sterile cloth (dressing). Burn care  Raise (elevate) the injured area above the level of your heart while sitting or lying down.  Follow instructions from your health care provider about: ? How to clean and take care of the burn. ? When to change and remove the dressing.  Check your burn every day for signs of infection. Check for: ? More redness, swelling, or pain. ? Warmth. ? Pus or a bad smell. Medicine   Take over-the-counter and prescription medicines only as told by your health care provider.  If you were prescribed antibiotic medicine, take or apply it as told by your health care provider. Do not stop using the antibiotic even if your condition improves. General instructions  To prevent infection: ? Do not put butter, oil, or other home remedies on the burn. ? Do not scratch or pick at the burn. ?  Do not break any blisters. ? Do not peel skin.  Do not rub your burn, even when you are cleaning it.  Protect your burn from the sun. How to care for a third-degree burn Right after a burn:  Lightly cover the burn with gauze.  Seek immediate medical attention. Burn care  Raise (elevate) the injured area above the level of your heart while sitting or lying down.  Drink enough fluid to keep your urine clear or pale yellow.  Rest as told by your health care provider. Do not participate in sports or other physical activities until your health care provider approves.   Follow instructions from your health care provider about: ? How to clean and take care of the burn. ? When to change and remove the dressing.  Check your burn every day for signs of infection. Check for: ? More redness, swelling, or pain. ? Warmth. ? Pus or a bad smell. Medicine  Take over-the-counter and prescription medicines only as told by your health care provider.  If you were prescribed antibiotic medicine, take or apply it as told by your health care provider. Do not stop using the antibiotic even if your condition improves. General instructions  To prevent infection: ? Do not put butter, oil, or other home remedies on the burn. ? Do not scratch or pick at the burn. ? Do not break any blisters. ? Do not peel skin. ? Do not rub your burn, even when you are cleaning it.  Protect your burn from the sun.  Keep all follow-up visits as told by your health care provider. This is important. Contact a health care provider if:  Your condition does not improve.  Your condition gets worse.  You have a fever.  Your burn changes in appearance or develops black or red spots.  Your burn feels warm to the touch.  Your pain is not controlled with medicine. Get help right away if:  You have redness, swelling, or pain at the site of the burn.  You have fluid, blood, or pus coming from your burn.  You have red streaks near the burn.  You have severe pain. This information is not intended to replace advice given to you by your health care provider. Make sure you discuss any questions you have with your health care provider. Document Released: 01/28/2005 Document Revised: 01/10/2017 Document Reviewed: 07/18/2015 Elsevier Patient Education  2020 [employer].
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
## 2018-12-07 NOTE — Progress Notes (Signed)
Wound measured and dressed prior to patient leaving todays visit.  Applied Silver Sulfadine 1% Cream to burn. Applied non-stick Telfa dressing and wrapped in [place].   Measurement  W 4" L 4.25"

## 2018-12-11 ENCOUNTER — Other Ambulatory Visit: Payer: Self-pay | Admitting: Student in an Organized Health Care Education/Training Program

## 2018-12-16 ENCOUNTER — Other Ambulatory Visit (INDEPENDENT_AMBULATORY_CARE_PROVIDER_SITE_OTHER): Payer: Self-pay | Admitting: Vascular Surgery

## 2018-12-16 DIAGNOSIS — Z95820 Peripheral vascular angioplasty status with implants and grafts: Secondary | ICD-10-CM

## 2018-12-16 DIAGNOSIS — I70223 Atherosclerosis of native arteries of extremities with rest pain, bilateral legs: Secondary | ICD-10-CM

## 2018-12-21 ENCOUNTER — Other Ambulatory Visit: Payer: Self-pay | Admitting: Family Medicine

## 2018-12-21 ENCOUNTER — Ambulatory Visit (INDEPENDENT_AMBULATORY_CARE_PROVIDER_SITE_OTHER): Payer: Medicare Other

## 2018-12-21 ENCOUNTER — Ambulatory Visit (INDEPENDENT_AMBULATORY_CARE_PROVIDER_SITE_OTHER): Payer: Medicare Other | Admitting: Vascular Surgery

## 2018-12-21 ENCOUNTER — Encounter (INDEPENDENT_AMBULATORY_CARE_PROVIDER_SITE_OTHER): Payer: Self-pay | Admitting: Vascular Surgery

## 2018-12-21 ENCOUNTER — Other Ambulatory Visit: Payer: Self-pay

## 2018-12-21 VITALS — BP 192/67 | HR 83 | Resp 16 | Wt 171.0 lb

## 2018-12-21 DIAGNOSIS — I70223 Atherosclerosis of native arteries of extremities with rest pain, bilateral legs: Secondary | ICD-10-CM

## 2018-12-21 DIAGNOSIS — I10 Essential (primary) hypertension: Secondary | ICD-10-CM

## 2018-12-21 DIAGNOSIS — T24202A Burn of second degree of unspecified site of left lower limb, except ankle and foot, initial encounter: Secondary | ICD-10-CM

## 2018-12-21 DIAGNOSIS — E782 Mixed hyperlipidemia: Secondary | ICD-10-CM

## 2018-12-21 DIAGNOSIS — Z95820 Peripheral vascular angioplasty status with implants and grafts: Secondary | ICD-10-CM

## 2018-12-21 DIAGNOSIS — I2510 Atherosclerotic heart disease of native coronary artery without angina pectoris: Secondary | ICD-10-CM

## 2018-12-21 NOTE — Telephone Encounter (Signed)
Patient's wife called and stated the patient is completely out of this cream.  Advised wife of protocol/time asked for refills request. She understood and stated that she was not aware of this   Can you send this whenever you get a moment? Thanks!

## 2018-12-21 NOTE — Telephone Encounter (Signed)
Please call and tell them that I am sending in a refill. If any signs of infection- redness, drainage, pain, fever, follow up. Otherwise be sure to show Dr. Einar Pheasant next week.

## 2018-12-21 NOTE — Telephone Encounter (Signed)
Spoke with patient wife, aware of recommendations. Nothing further needed.

## 2018-12-21 NOTE — Telephone Encounter (Addendum)
Okay to refill is not worsening.  Is there any improvement to the site? Any increased redness or infection concerns?  -------  Wife states that it looks like it is getting better - shrinking some.  No increased redness, or concern of infection. No thickening to skin. Some itching at site - possibly from healing and drying out.  Wife states that it still looks bad but is getting some better.   Not able to send a picture, does not have MyChart.   Will send to [facility] for recommendations.   Pt did have a New Patient appt with Dr Einar Pheasant tomorrow but this was rescheduled due to her being out - moved to 12/28/18.
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
## 2018-12-22 ENCOUNTER — Ambulatory Visit: Payer: Medicare Other | Admitting: Family Medicine

## 2018-12-25 ENCOUNTER — Telehealth: Payer: Self-pay

## 2018-12-25 NOTE — Telephone Encounter (Signed)
Dr Holley Raring, the patient called this morning asking for a refill of his Gabapentin.  His last appointment was in July and upon looking at his note, It looks like he just had a prn appointment if needed.  He states that he feels the Gabapentin is helping.  He has been out for a couple of days.  I informed him that you may need to have a virtual appointment, but that I would send you the message. I confirmed that the pharmacy is still [facility] in [place].  Thank you

## 2018-12-28 ENCOUNTER — Ambulatory Visit (INDEPENDENT_AMBULATORY_CARE_PROVIDER_SITE_OTHER): Payer: Medicare Other | Admitting: Family Medicine

## 2018-12-28 ENCOUNTER — Other Ambulatory Visit: Payer: Self-pay

## 2018-12-28 ENCOUNTER — Encounter: Payer: Self-pay | Admitting: Family Medicine

## 2018-12-28 VITALS — BP 118/52 | HR 66 | Temp 98.5°F | Ht 66.75 in | Wt 171.5 lb

## 2018-12-28 DIAGNOSIS — L551 Sunburn of second degree: Secondary | ICD-10-CM | POA: Diagnosis not present

## 2018-12-28 DIAGNOSIS — I10 Essential (primary) hypertension: Secondary | ICD-10-CM

## 2018-12-28 DIAGNOSIS — G63 Polyneuropathy in diseases classified elsewhere: Secondary | ICD-10-CM | POA: Diagnosis not present

## 2018-12-28 DIAGNOSIS — N183 Chronic kidney disease, stage 3 unspecified: Secondary | ICD-10-CM

## 2018-12-28 DIAGNOSIS — E1122 Type 2 diabetes mellitus with diabetic chronic kidney disease: Secondary | ICD-10-CM

## 2018-12-28 DIAGNOSIS — Z794 Long term (current) use of insulin: Secondary | ICD-10-CM | POA: Diagnosis not present

## 2018-12-28 DIAGNOSIS — R197 Diarrhea, unspecified: Secondary | ICD-10-CM | POA: Diagnosis not present

## 2018-12-28 DIAGNOSIS — Z89421 Acquired absence of other right toe(s): Secondary | ICD-10-CM | POA: Diagnosis not present

## 2018-12-28 DIAGNOSIS — T24202A Burn of second degree of unspecified site of left lower limb, except ankle and foot, initial encounter: Secondary | ICD-10-CM

## 2018-12-28 MED ORDER — GABAPENTIN 300 MG PO CAPS: 300.0000 mg | ORAL_CAPSULE | Freq: Two times a day (BID) | ORAL | 5 refills | Status: DC

## 2018-12-28 MED ORDER — "INSULIN SYRINGE-NEEDLE U-100 31G X 5/16"" 1 ML MISC": 11 refills | Status: DC

## 2018-12-28 NOTE — Patient Instructions (Addendum)
Decrease Lantus to 18 units daily No need to take additional Lantus Continue to monitor Glucose   Diarrhea/Constipaton - Consider Metamucil (fiber supplement) - once a day - can try to avoid certain foods to see if that helps   Low-FODMAP Eating Plan  FODMAPs (fermentable oligosaccharides, disaccharides, monosaccharides, and polyols) are sugars that are hard for some people to digest. A low-FODMAP eating plan may help some people who have bowel (intestinal) diseases to manage their symptoms. This meal plan can be complicated to follow. Work with a diet and nutrition specialist (dietitian) to make a low-FODMAP eating plan that is right for you. A dietitian can make sure that you get enough nutrition from this diet. What are tips for following this plan? Reading food labels  Check labels for hidden FODMAPs such as: ? High-fructose syrup. ? Honey. ? Agave. ? Natural fruit flavors. ? Onion or garlic powder.  Choose low-FODMAP foods that contain 3-4 grams of fiber per serving.  Check food labels for serving sizes. Eat only one serving at a time to make sure FODMAP levels stay low. Meal planning  Follow a low-FODMAP eating plan for up to 6 weeks, or as told by your health care provider or dietitian.  To follow the eating plan: 1. Eliminate high-FODMAP foods from your diet completely. 2. Gradually reintroduce high-FODMAP foods into your diet one at a time. Most people should wait a few days after introducing one high-FODMAP food before they introduce the next high-FODMAP food. Your dietitian can recommend how quickly you may reintroduce foods. 3. Keep a daily record of what you eat and drink, and make note of any symptoms that you have after eating. 4. Review your daily record with a dietitian regularly. Your dietitian can help you identify which foods you can eat and which foods you should avoid. General tips  Drink enough fluid each day to keep your urine pale yellow.  Avoid  processed foods. These often have added sugar and may be high in FODMAPs.  Avoid most dairy products, whole grains, and sweeteners.  Work with a dietitian to make sure you get enough fiber in your diet. Recommended foods Grains  Gluten-free grains, such as rice, oats, buckwheat, quinoa, corn, polenta, and millet. Gluten-free pasta, bread, or cereal. Rice noodles. Corn tortillas. Vegetables  Eggplant, zucchini, cucumber, peppers, green beans, Brussels sprouts, bean sprouts, lettuce, arugula, kale, Swiss chard, spinach, collard greens, bok choy, summer squash, potato, and tomato. Limited amounts of corn, carrot, and sweet potato. Green parts of scallions. Fruits  Bananas, oranges, lemons, limes, blueberries, raspberries, strawberries, grapes, cantaloupe, honeydew melon, kiwi, papaya, passion fruit, and pineapple. Limited amounts of dried cranberries, banana chips, and shredded coconut. Dairy  Lactose-free milk, yogurt, and kefir. Lactose-free cottage cheese and ice cream. Non-dairy milks, such as almond, coconut, hemp, and rice milk. Yogurts made of non-dairy milks. Limited amounts of goat cheese, brie, mozzarella, parmesan, swiss, and other hard cheeses. Meats and other protein foods  Unseasoned beef, pork, poultry, or fish. Eggs. Berniece Salines. Tofu (firm) and tempeh. Limited amounts of nuts and seeds, such as almonds, walnuts, [place] nuts, pecans, peanuts, pumpkin seeds, chia seeds, and sunflower seeds. Fats and oils  Butter-free spreads. Vegetable oils, such as olive, canola, and sunflower oil. Seasoning and other foods  Artificial sweeteners with names that do not end in "ol" such as aspartame, saccharine, and stevia. Maple syrup, white table sugar, raw sugar, brown sugar, and molasses. Fresh basil, coriander, parsley, rosemary, and thyme. Beverages  Water and mineral water.  Sugar-sweetened soft drinks. Small amounts of orange juice or cranberry juice. Black and green tea. Most dry wines.  Coffee. This may not be a complete list of low-FODMAP foods. Talk with your dietitian for more information. Foods to avoid Grains  Wheat, including kamut, durum, and semolina. Barley and bulgur. Couscous. Wheat-based cereals. Wheat noodles, bread, crackers, and pastries. Vegetables  Chicory root, artichoke, asparagus, cabbage, snow peas, sugar snap peas, mushrooms, and cauliflower. Onions, garlic, leeks, and the white part of scallions. Fruits  Fresh, dried, and juiced forms of apple, pear, watermelon, peach, plum, cherries, apricots, blackberries, boysenberries, figs, nectarines, and mango. Avocado. Dairy  Milk, yogurt, ice cream, and soft cheese. Cream and sour cream. Milk-based sauces. Custard. Meats and other protein foods  Fried or fatty meat. Sausage. Cashews and pistachios. Soybeans, baked beans, black beans, chickpeas, kidney beans, fava beans, navy beans, lentils, and split peas. Seasoning and other foods  Any sugar-free gum or candy. Foods that contain artificial sweeteners such as sorbitol, mannitol, isomalt, or xylitol. Foods that contain honey, high-fructose corn syrup, or agave. Bouillon, vegetable stock, beef stock, and chicken stock. Garlic and onion powder. Condiments made with onion, such as hummus, chutney, pickles, relish, salad dressing, and salsa. Tomato paste. Beverages  Chicory-based drinks. Coffee substitutes. Chamomile tea. Fennel tea. Sweet or fortified wines such as port or sherry. Diet soft drinks made with isomalt, mannitol, maltitol, sorbitol, or xylitol. Apple, pear, and mango juice. Juices with high-fructose corn syrup. This may not be a complete list of high-FODMAP foods. Talk with your dietitian to discuss what dietary choices are best for you.  Summary  A low-FODMAP eating plan is a short-term diet that eliminates FODMAPs from your diet to help ease symptoms of certain bowel diseases.  The eating plan usually lasts up to 6 weeks. After that,  high-FODMAP foods are restarted gradually, one at a time, so you can find out which may be causing symptoms.  A low-FODMAP eating plan can be complicated. It is best to work with a dietitian who has experience with this type of plan. This information is not intended to replace advice given to you by your health care provider. Make sure you discuss any questions you have with your health care provider. Document Released: 09/24/2016 Document Revised: 01/10/2017 Document Reviewed: 09/24/2016 Elsevier Patient Education  2020 [employer].
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
## 2018-12-28 NOTE — Assessment & Plan Note (Signed)
DM well controlled. Has some lows. Discussed that he could decrease lantus to 18 units and continue to monitor. Optho referral today. Foot exam with neuropathy

## 2018-12-28 NOTE — Assessment & Plan Note (Signed)
BP at goal, sees cardiology. Cont medication

## 2018-12-28 NOTE — Assessment & Plan Note (Signed)
Still with poor skin healing. Does not appear infected, but will refer to wound clinic for close monitoring and management given age, severity, and diabetes

## 2018-12-28 NOTE — Assessment & Plan Note (Signed)
Complaining of chronic intermittent diarrhea. Advised trial of fiber supplement and FOD-MAP. Referral to GI per request placed as well.

## 2018-12-28 NOTE — Telephone Encounter (Signed)
Patient notified

## 2018-12-28 NOTE — Assessment & Plan Note (Signed)
Intact to touch, but no monofilament

## 2019-01-01 ENCOUNTER — Other Ambulatory Visit: Payer: Self-pay

## 2019-01-01 ENCOUNTER — Encounter: Payer: Medicare Other | Attending: Physician Assistant | Admitting: Physician Assistant

## 2019-01-01 DIAGNOSIS — Z794 Long term (current) use of insulin: Secondary | ICD-10-CM | POA: Insufficient documentation

## 2019-01-01 DIAGNOSIS — E1151 Type 2 diabetes mellitus with diabetic peripheral angiopathy without gangrene: Secondary | ICD-10-CM | POA: Insufficient documentation

## 2019-01-01 DIAGNOSIS — I252 Old myocardial infarction: Secondary | ICD-10-CM | POA: Insufficient documentation

## 2019-01-01 DIAGNOSIS — E1140 Type 2 diabetes mellitus with diabetic neuropathy, unspecified: Secondary | ICD-10-CM | POA: Insufficient documentation

## 2019-01-01 DIAGNOSIS — X088XXA Exposure to other specified smoke, fire and flames, initial encounter: Secondary | ICD-10-CM | POA: Insufficient documentation

## 2019-01-01 DIAGNOSIS — T24212A Burn of second degree of left thigh, initial encounter: Secondary | ICD-10-CM | POA: Diagnosis not present

## 2019-01-01 DIAGNOSIS — I2510 Atherosclerotic heart disease of native coronary artery without angina pectoris: Secondary | ICD-10-CM | POA: Diagnosis not present

## 2019-01-01 DIAGNOSIS — F0150 Vascular dementia without behavioral disturbance: Secondary | ICD-10-CM | POA: Diagnosis not present

## 2019-01-01 DIAGNOSIS — I10 Essential (primary) hypertension: Secondary | ICD-10-CM | POA: Insufficient documentation

## 2019-01-01 DIAGNOSIS — T24012A Burn of unspecified degree of left thigh, initial encounter: Secondary | ICD-10-CM | POA: Diagnosis present

## 2019-01-01 DIAGNOSIS — F0390 Unspecified dementia without behavioral disturbance: Secondary | ICD-10-CM | POA: Insufficient documentation

## 2019-01-14 ENCOUNTER — Encounter: Payer: Medicare Other | Attending: Physician Assistant | Admitting: Physician Assistant

## 2019-01-14 ENCOUNTER — Other Ambulatory Visit: Payer: Self-pay

## 2019-01-14 DIAGNOSIS — Z833 Family history of diabetes mellitus: Secondary | ICD-10-CM | POA: Diagnosis not present

## 2019-01-14 DIAGNOSIS — I10 Essential (primary) hypertension: Secondary | ICD-10-CM | POA: Insufficient documentation

## 2019-01-14 DIAGNOSIS — E1151 Type 2 diabetes mellitus with diabetic peripheral angiopathy without gangrene: Secondary | ICD-10-CM | POA: Diagnosis not present

## 2019-01-14 DIAGNOSIS — I252 Old myocardial infarction: Secondary | ICD-10-CM | POA: Insufficient documentation

## 2019-01-14 DIAGNOSIS — E1140 Type 2 diabetes mellitus with diabetic neuropathy, unspecified: Secondary | ICD-10-CM | POA: Insufficient documentation

## 2019-01-14 DIAGNOSIS — Z87891 Personal history of nicotine dependence: Secondary | ICD-10-CM | POA: Insufficient documentation

## 2019-01-14 DIAGNOSIS — L98499 Non-pressure chronic ulcer of skin of other sites with unspecified severity: Secondary | ICD-10-CM | POA: Diagnosis not present

## 2019-01-14 DIAGNOSIS — F0150 Vascular dementia without behavioral disturbance: Secondary | ICD-10-CM | POA: Diagnosis not present

## 2019-01-14 DIAGNOSIS — T24212A Burn of second degree of left thigh, initial encounter: Secondary | ICD-10-CM | POA: Diagnosis not present

## 2019-01-14 DIAGNOSIS — M109 Gout, unspecified: Secondary | ICD-10-CM | POA: Diagnosis not present

## 2019-01-14 DIAGNOSIS — E11622 Type 2 diabetes mellitus with other skin ulcer: Secondary | ICD-10-CM | POA: Insufficient documentation

## 2019-01-14 DIAGNOSIS — Z8249 Family history of ischemic heart disease and other diseases of the circulatory system: Secondary | ICD-10-CM | POA: Insufficient documentation

## 2019-01-14 DIAGNOSIS — I2510 Atherosclerotic heart disease of native coronary artery without angina pectoris: Secondary | ICD-10-CM | POA: Diagnosis not present
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
## 2019-01-23 ENCOUNTER — Other Ambulatory Visit: Payer: Self-pay | Admitting: Family Medicine

## 2019-01-28 ENCOUNTER — Other Ambulatory Visit: Payer: Self-pay | Admitting: Family Medicine

## 2019-01-28 ENCOUNTER — Other Ambulatory Visit: Payer: Self-pay | Admitting: Primary Care

## 2019-01-28 ENCOUNTER — Encounter: Payer: Medicare Other | Admitting: Physician Assistant

## 2019-01-28 ENCOUNTER — Other Ambulatory Visit: Payer: Self-pay

## 2019-01-28 DIAGNOSIS — E11622 Type 2 diabetes mellitus with other skin ulcer: Secondary | ICD-10-CM | POA: Diagnosis not present

## 2019-01-28 DIAGNOSIS — Z434 Encounter for attention to other artificial openings of digestive tract: Secondary | ICD-10-CM

## 2019-01-28 NOTE — Telephone Encounter (Signed)
Last prescribed on 10/02/2018 by Allie Bossier . Last appointment on 12/28/2018 with Dr Einar Pheasant. No future appointment

## 2019-01-28 NOTE — Telephone Encounter (Signed)
Unclear if patient still needs this medication.   It looks like he is overdue for seeing the general surgeon. Would recommend he follow-up with general surgery to discuss whether or not he should continue the medication.   Could do a short bridge to get to that appointment if needed.
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
## 2019-01-29 NOTE — Telephone Encounter (Signed)
Patient's wife, Vaughan Basta, advised. Appointment made and they wanted to do in office visit and not virtual visit because they have hard time doing that kind of visit.

## 2019-01-29 NOTE — Telephone Encounter (Signed)
Spoke with patient's wife Vaughan Basta, and she states patient is taking this medication. Vaughan Basta said that patient was not going to be going to see surgeon anymore. Please review.

## 2019-01-29 NOTE — Telephone Encounter (Signed)
I will message the surgeon's office.   Per the last visit they recommended follow-up.   Refill provided, but I will follow-up with more information.

## 2019-02-10 ENCOUNTER — Other Ambulatory Visit: Payer: Self-pay | Admitting: Family Medicine

## 2019-02-15 ENCOUNTER — Ambulatory Visit: Payer: Medicare Other | Admitting: Family Medicine

## 2019-02-16 ENCOUNTER — Ambulatory Visit: Payer: Medicare Other | Admitting: Family Medicine

## 2019-02-18 ENCOUNTER — Encounter: Payer: Medicare Other | Attending: Physician Assistant | Admitting: Physician Assistant

## 2019-02-18 ENCOUNTER — Other Ambulatory Visit: Payer: Self-pay

## 2019-02-18 DIAGNOSIS — I2510 Atherosclerotic heart disease of native coronary artery without angina pectoris: Secondary | ICD-10-CM | POA: Insufficient documentation

## 2019-02-18 DIAGNOSIS — I252 Old myocardial infarction: Secondary | ICD-10-CM | POA: Diagnosis not present

## 2019-02-18 DIAGNOSIS — L559 Sunburn, unspecified: Secondary | ICD-10-CM | POA: Insufficient documentation

## 2019-02-18 DIAGNOSIS — F0150 Vascular dementia without behavioral disturbance: Secondary | ICD-10-CM | POA: Insufficient documentation

## 2019-02-18 DIAGNOSIS — Z955 Presence of coronary angioplasty implant and graft: Secondary | ICD-10-CM | POA: Insufficient documentation

## 2019-02-18 DIAGNOSIS — T24012A Burn of unspecified degree of left thigh, initial encounter: Secondary | ICD-10-CM | POA: Diagnosis not present

## 2019-02-18 DIAGNOSIS — T24212A Burn of second degree of left thigh, initial encounter: Secondary | ICD-10-CM | POA: Insufficient documentation
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
## 2019-02-22 DIAGNOSIS — E1169 Type 2 diabetes mellitus with other specified complication: Secondary | ICD-10-CM | POA: Diagnosis not present

## 2019-02-22 DIAGNOSIS — I252 Old myocardial infarction: Secondary | ICD-10-CM | POA: Diagnosis not present

## 2019-02-22 DIAGNOSIS — R159 Full incontinence of feces: Secondary | ICD-10-CM | POA: Diagnosis not present

## 2019-02-22 DIAGNOSIS — Z95820 Peripheral vascular angioplasty status with implants and grafts: Secondary | ICD-10-CM | POA: Diagnosis not present

## 2019-02-22 DIAGNOSIS — Z7902 Long term (current) use of antithrombotics/antiplatelets: Secondary | ICD-10-CM | POA: Diagnosis not present

## 2019-02-22 DIAGNOSIS — R197 Diarrhea, unspecified: Secondary | ICD-10-CM | POA: Diagnosis not present

## 2019-02-25 DIAGNOSIS — R197 Diarrhea, unspecified: Secondary | ICD-10-CM | POA: Diagnosis not present

## 2019-03-01 ENCOUNTER — Ambulatory Visit (INDEPENDENT_AMBULATORY_CARE_PROVIDER_SITE_OTHER): Payer: Medicare Other | Admitting: Family Medicine

## 2019-03-01 ENCOUNTER — Encounter: Payer: Self-pay | Admitting: Family Medicine

## 2019-03-01 ENCOUNTER — Other Ambulatory Visit: Payer: Self-pay

## 2019-03-01 VITALS — BP 124/52 | HR 64 | Temp 98.0°F | Ht 66.75 in | Wt 178.2 lb

## 2019-03-01 DIAGNOSIS — N39492 Postural (urinary) incontinence: Secondary | ICD-10-CM | POA: Diagnosis not present

## 2019-03-01 DIAGNOSIS — I10 Essential (primary) hypertension: Secondary | ICD-10-CM | POA: Diagnosis not present

## 2019-03-01 DIAGNOSIS — R2689 Other abnormalities of gait and mobility: Secondary | ICD-10-CM | POA: Insufficient documentation

## 2019-03-01 DIAGNOSIS — Z8546 Personal history of malignant neoplasm of prostate: Secondary | ICD-10-CM

## 2019-03-01 DIAGNOSIS — M65331 Trigger finger, right middle finger: Secondary | ICD-10-CM | POA: Diagnosis not present

## 2019-03-01 NOTE — Assessment & Plan Note (Signed)
BP at goal. Cont current meds

## 2019-03-01 NOTE — Patient Instructions (Signed)
#  Urinary symptoms - referral to urology  #Trigger Finger - I'm glad that you are getting the ortho appointment tomorrow  #Knee symptoms - physical therapy

## 2019-03-01 NOTE — Assessment & Plan Note (Signed)
Pt notes he is taking hyoscyamine due to urinary symptoms and has what sounds like possible overflow incontinence vs postural incontinence. Will refer to Urology as this seems to be worsening. Also with hx of prostate cancer. Last PSA was trending up. Will have patient return for PSA check while awaiting referral. Exam deferred.

## 2019-03-01 NOTE — Assessment & Plan Note (Signed)
Already has ortho appointment. Agree with possible injection

## 2019-03-01 NOTE — Assessment & Plan Note (Signed)
Pt notes weakness and difficulty walking with the right side. Discussed how PT can be helpful for specific exercises. 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 Due to time could not fully assess hip and trunk strength but feel PT would be helpful as needing hands to get up out of chair.

## 2019-03-01 NOTE — Progress Notes (Signed)
Subjective:     Peter [name] is a 86 y.o. male presenting for Follow-up     HPI   #Trigger finger - on the right hand - painful  - has been happening for a long time - has an appointment with orthopedic doctor  - hx of having a steroid injection in the past w/ success  #Right knee pain - no strength - hard time getting up from a seat - hx of ACL, meniscus, patella injury - had a shot in his knee a few months ago w/o help - constant squeezing/tightness - did PT a while ago w/o help  - notes some weakness  Also having some back pain Saw a specialist so was initially referred to get a procedure Cardiologist said to wait Has had epidurals in the past New procedure with plan to freeze the nerve  #Urine incontinence - small volume loss of urine - has urgency as well - wife thinks he tried flomax - taking the hyoscyamine - has had urinary symptoms for a long time and seems to be getting worse - endorses some stool incontinence - seeing GI and has been getting worked-up for that reason - no difficulty initiating stream - will stop in the middle of urinating and then waits and empties another large amount - if sitting for a while feels fine, and then standing will incontinence      Review of Systems   Social History   Tobacco Use  Smoking Status Former Smoker  . Packs/day: 0.75  . Years: 12.00  . Pack years: 9.00  . Types: Cigars, Cigarettes  . Quit date: 101  . Years since quitting: 51.0  Smokeless Tobacco Former Systems developer  . Types: Chew  . Quit date: 2016        Objective:    BP Readings from Last 3 Encounters:  03/01/19 (!) 124/52  12/28/18 (!) 118/52  12/21/18 (!) 192/67   Wt Readings from Last 3 Encounters:  03/01/19 178 lb 4 oz (80.9 kg)  12/28/18 171 lb 8 oz (77.8 kg)  12/21/18 171 lb (77.6 kg)    BP (!) 124/52   Pulse 64   Temp 98 F (36.7 C)   Ht 5' 6.75" (1.695 m)   Wt 178 lb 4 oz (80.9 kg)   SpO2 97%   BMI 28.13 kg/m     Physical Exam Constitutional:      Appearance: Normal appearance. He is not ill-appearing or diaphoretic.  HENT:     Right Ear: External ear normal.     Left Ear: External ear normal.     Nose: Nose normal.  Eyes:     General: No scleral icterus.    Extraocular Movements: Extraocular movements intact.     Conjunctiva/sclera: Conjunctivae normal.  Cardiovascular:     Rate and Rhythm: Normal rate.  Pulmonary:     Effort: Pulmonary effort is normal.  Musculoskeletal:     Cervical back: Neck supple.     Comments: Using both hands to get to standing. Some imbalance noted. Moves knee without difficulty. No TTP to palpation  Skin:    General: Skin is warm and dry.  Neurological:     Mental Status: He is alert. Mental status is at baseline.  Psychiatric:        Mood and Affect: Mood normal.           Assessment & Plan:   Problem List Items Addressed This Visit      Cardiovascular and Mediastinum  Hypertension    BP at goal. Cont current meds      Relevant Medications   losartan (COZAAR) 100 MG tablet   Other Relevant Orders   Comprehensive metabolic panel     Musculoskeletal and Integument   Trigger middle finger of right hand    Already has ortho appointment. Agree with possible injection        Other   Hx of malignant neoplasm of prostate   Relevant Orders   PSA   Postural urinary incontinence - Primary    Pt notes he is taking hyoscyamine due to urinary symptoms and has what sounds like possible overflow incontinence vs postural incontinence. Will refer to Urology as this seems to be worsening. Also with hx of prostate cancer. Last PSA was trending up. Will have patient return for PSA check while awaiting referral. Exam deferred.       Relevant Orders   Ambulatory referral to Urology   PSA   Balance problem    Pt notes weakness and difficulty walking with the right side. Discussed how PT can be helpful for specific exercises. Has toe amputation on that  side. Due to time could not fully assess hip and trunk strength but feel PT would be helpful as needing hands to get up out of chair.       Relevant Orders   Ambulatory referral to Physical Therapy       Return in about 6 months (around 08/29/2019).  Lesleigh Noe, MD

## 2019-03-02 ENCOUNTER — Telehealth: Payer: Self-pay

## 2019-03-02 NOTE — Telephone Encounter (Signed)
-----   Message from Lesleigh Noe, MD sent at 03/01/2019  5:49 PM EST ----- Please call patient and have him return for labs. I want to check his PSA because it has been a year.

## 2019-03-02 NOTE — Telephone Encounter (Signed)
Left message for patient to call back and schedule lab appointment.

## 2019-03-03 NOTE — Telephone Encounter (Signed)
Appointment made by Raquel Sarna

## 2019-03-04 ENCOUNTER — Encounter: Payer: Medicare Other | Admitting: Physician Assistant

## 2019-03-04 ENCOUNTER — Other Ambulatory Visit: Payer: Self-pay

## 2019-03-04 ENCOUNTER — Other Ambulatory Visit (INDEPENDENT_AMBULATORY_CARE_PROVIDER_SITE_OTHER): Payer: Medicare Other

## 2019-03-04 DIAGNOSIS — N39492 Postural (urinary) incontinence: Secondary | ICD-10-CM

## 2019-03-04 DIAGNOSIS — G5622 Lesion of ulnar nerve, left upper limb: Secondary | ICD-10-CM | POA: Diagnosis not present

## 2019-03-04 DIAGNOSIS — L559 Sunburn, unspecified: Secondary | ICD-10-CM | POA: Diagnosis not present

## 2019-03-04 DIAGNOSIS — Z8546 Personal history of malignant neoplasm of prostate: Secondary | ICD-10-CM | POA: Diagnosis not present

## 2019-03-04 DIAGNOSIS — I2510 Atherosclerotic heart disease of native coronary artery without angina pectoris: Secondary | ICD-10-CM | POA: Diagnosis not present

## 2019-03-04 DIAGNOSIS — I10 Essential (primary) hypertension: Secondary | ICD-10-CM | POA: Diagnosis not present

## 2019-03-04 DIAGNOSIS — M65341 Trigger finger, right ring finger: Secondary | ICD-10-CM | POA: Diagnosis not present

## 2019-03-04 DIAGNOSIS — F0150 Vascular dementia without behavioral disturbance: Secondary | ICD-10-CM | POA: Diagnosis not present

## 2019-03-04 DIAGNOSIS — G5621 Lesion of ulnar nerve, right upper limb: Secondary | ICD-10-CM | POA: Diagnosis not present

## 2019-03-04 DIAGNOSIS — I252 Old myocardial infarction: Secondary | ICD-10-CM | POA: Diagnosis not present

## 2019-03-04 DIAGNOSIS — Z955 Presence of coronary angioplasty implant and graft: Secondary | ICD-10-CM | POA: Diagnosis not present

## 2019-03-04 DIAGNOSIS — T24012A Burn of unspecified degree of left thigh, initial encounter: Secondary | ICD-10-CM | POA: Diagnosis not present

## 2019-03-04 DIAGNOSIS — T24212A Burn of second degree of left thigh, initial encounter: Secondary | ICD-10-CM | POA: Diagnosis not present

## 2019-03-04 LAB — PSA: PSA: 14.68 ng/mL — ABNORMAL HIGH (ref 0.10–4.00)

## 2019-03-04 LAB — COMPREHENSIVE METABOLIC PANEL
ALT: 28 U/L (ref 0–53)
AST: 25 U/L (ref 0–37)
Albumin: 3.6 g/dL (ref 3.5–5.2)
Alkaline Phosphatase: 98 U/L (ref 39–117)
BUN: 33 mg/dL — ABNORMAL HIGH (ref 6–23)
CO2: 26 mEq/L (ref 19–32)
Calcium: 8.4 mg/dL (ref 8.4–10.5)
Chloride: 107 mEq/L (ref 96–112)
Creatinine, Ser: 1.57 mg/dL — ABNORMAL HIGH (ref 0.40–1.50)
GFR: 42.01 mL/min — ABNORMAL LOW (ref >60.00)
Glucose, Bld: 109 mg/dL — ABNORMAL HIGH (ref 70–99)
Potassium: 4.5 mEq/L (ref 3.5–5.1)
Sodium: 139 mEq/L (ref 135–145)
Total Bilirubin: 0.4 mg/dL (ref 0.2–1.2)
Total Protein: 6 g/dL (ref 6.0–8.3)
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
## 2019-03-05 ENCOUNTER — Other Ambulatory Visit: Payer: Self-pay | Admitting: Family Medicine

## 2019-03-05 ENCOUNTER — Telehealth: Payer: Self-pay | Admitting: Family Medicine

## 2019-03-05 NOTE — Telephone Encounter (Signed)
Called back but not able to leave a message

## 2019-03-05 NOTE — Telephone Encounter (Signed)
Patient's wife returned your call about the patient's labs

## 2019-03-05 NOTE — Telephone Encounter (Signed)
[name] advised. 

## 2019-03-11 ENCOUNTER — Telehealth: Payer: Self-pay

## 2019-03-11 MED ORDER — INSULIN GLARGINE 100 UNIT/ML ~~LOC~~ SOLN: 20.0000 [IU] | Freq: Every day | SUBCUTANEOUS | 3 refills | Status: DC

## 2019-03-11 NOTE — Telephone Encounter (Signed)
Pt needs a refill of lantus sent to [facility]. I have done that for him.

## 2019-03-15 DIAGNOSIS — G5623 Lesion of ulnar nerve, bilateral upper limbs: Secondary | ICD-10-CM | POA: Diagnosis not present

## 2019-03-15 DIAGNOSIS — E1140 Type 2 diabetes mellitus with diabetic neuropathy, unspecified: Secondary | ICD-10-CM | POA: Diagnosis not present

## 2019-03-15 DIAGNOSIS — G5603 Carpal tunnel syndrome, bilateral upper limbs: Secondary | ICD-10-CM | POA: Diagnosis not present

## 2019-03-17 DIAGNOSIS — M47816 Spondylosis without myelopathy or radiculopathy, lumbar region: Secondary | ICD-10-CM | POA: Diagnosis not present

## 2019-03-17 DIAGNOSIS — R030 Elevated blood-pressure reading, without diagnosis of hypertension: Secondary | ICD-10-CM | POA: Diagnosis not present

## 2019-03-17 DIAGNOSIS — M545 Low back pain: Secondary | ICD-10-CM | POA: Diagnosis not present

## 2019-03-17 DIAGNOSIS — Z6826 Body mass index (BMI) 26.0-26.9, adult: Secondary | ICD-10-CM | POA: Diagnosis not present

## 2019-03-18 ENCOUNTER — Other Ambulatory Visit (INDEPENDENT_AMBULATORY_CARE_PROVIDER_SITE_OTHER): Payer: Self-pay | Admitting: Vascular Surgery

## 2019-03-18 DIAGNOSIS — I70223 Atherosclerosis of native arteries of extremities with rest pain, bilateral legs: Secondary | ICD-10-CM

## 2019-03-18 DIAGNOSIS — Z95820 Peripheral vascular angioplasty status with implants and grafts: Secondary | ICD-10-CM

## 2019-03-18 DIAGNOSIS — R2681 Unsteadiness on feet: Secondary | ICD-10-CM | POA: Diagnosis not present

## 2019-03-22 ENCOUNTER — Other Ambulatory Visit: Payer: Self-pay

## 2019-03-22 ENCOUNTER — Encounter (INDEPENDENT_AMBULATORY_CARE_PROVIDER_SITE_OTHER): Payer: Self-pay | Admitting: Nurse Practitioner

## 2019-03-22 ENCOUNTER — Ambulatory Visit (INDEPENDENT_AMBULATORY_CARE_PROVIDER_SITE_OTHER): Payer: Medicare Other | Admitting: Nurse Practitioner

## 2019-03-22 ENCOUNTER — Ambulatory Visit (INDEPENDENT_AMBULATORY_CARE_PROVIDER_SITE_OTHER): Payer: Medicare Other

## 2019-03-22 VITALS — BP 189/52 | HR 60 | Resp 12 | Ht 68.0 in | Wt 180.0 lb

## 2019-03-22 DIAGNOSIS — T24202A Burn of second degree of unspecified site of left lower limb, except ankle and foot, initial encounter: Secondary | ICD-10-CM

## 2019-03-22 DIAGNOSIS — Z95820 Peripheral vascular angioplasty status with implants and grafts: Secondary | ICD-10-CM

## 2019-03-22 DIAGNOSIS — I70223 Atherosclerosis of native arteries of extremities with rest pain, bilateral legs: Secondary | ICD-10-CM

## 2019-03-22 DIAGNOSIS — I10 Essential (primary) hypertension: Secondary | ICD-10-CM | POA: Diagnosis not present

## 2019-03-23 ENCOUNTER — Encounter (INDEPENDENT_AMBULATORY_CARE_PROVIDER_SITE_OTHER): Payer: Self-pay | Admitting: Nurse Practitioner

## 2019-03-23 DIAGNOSIS — R2681 Unsteadiness on feet: Secondary | ICD-10-CM | POA: Diagnosis not present
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
## 2019-03-25 ENCOUNTER — Other Ambulatory Visit: Payer: Self-pay | Admitting: Family Medicine

## 2019-03-26 DIAGNOSIS — R2681 Unsteadiness on feet: Secondary | ICD-10-CM | POA: Diagnosis not present

## 2019-03-31 ENCOUNTER — Other Ambulatory Visit: Payer: Self-pay

## 2019-03-31 ENCOUNTER — Encounter: Payer: Self-pay | Admitting: Urology

## 2019-03-31 ENCOUNTER — Ambulatory Visit (INDEPENDENT_AMBULATORY_CARE_PROVIDER_SITE_OTHER): Payer: Medicare Other | Admitting: Urology

## 2019-03-31 VITALS — BP 170/52 | HR 68

## 2019-03-31 DIAGNOSIS — R2681 Unsteadiness on feet: Secondary | ICD-10-CM | POA: Diagnosis not present

## 2019-03-31 DIAGNOSIS — N39492 Postural (urinary) incontinence: Secondary | ICD-10-CM

## 2019-03-31 DIAGNOSIS — R3915 Urgency of urination: Secondary | ICD-10-CM | POA: Diagnosis not present

## 2019-03-31 DIAGNOSIS — N401 Enlarged prostate with lower urinary tract symptoms: Secondary | ICD-10-CM | POA: Diagnosis not present

## 2019-03-31 DIAGNOSIS — R339 Retention of urine, unspecified: Secondary | ICD-10-CM | POA: Diagnosis not present

## 2019-03-31 DIAGNOSIS — I70223 Atherosclerosis of native arteries of extremities with rest pain, bilateral legs: Secondary | ICD-10-CM

## 2019-03-31 DIAGNOSIS — Z8546 Personal history of malignant neoplasm of prostate: Secondary | ICD-10-CM | POA: Diagnosis not present

## 2019-03-31 DIAGNOSIS — R9721 Rising PSA following treatment for malignant neoplasm of prostate: Secondary | ICD-10-CM | POA: Diagnosis not present

## 2019-03-31 LAB — URINALYSIS, COMPLETE
Bilirubin, UA: NEGATIVE
Glucose, UA: NEGATIVE
Ketones, UA: NEGATIVE
Leukocytes,UA: NEGATIVE
Nitrite, UA: NEGATIVE
Specific Gravity, UA: 1.02 (ref 1.005–1.030)
Urobilinogen, Ur: 0.2 mg/dL (ref 0.2–1.0)
pH, UA: 5 (ref 5.0–7.5)

## 2019-03-31 LAB — BLADDER SCAN AMB NON-IMAGING: Scan Result: 150

## 2019-03-31 LAB — MICROSCOPIC EXAMINATION: RBC, Urine: NONE SEEN /hpf (ref 0–2)

## 2019-03-31 MED ORDER — FINASTERIDE 5 MG PO TABS: 5.0000 mg | ORAL_TABLET | Freq: Every day | ORAL | 11 refills | Status: DC
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
## 2019-04-02 DIAGNOSIS — R2681 Unsteadiness on feet: Secondary | ICD-10-CM | POA: Diagnosis not present

## 2019-04-02 NOTE — Progress Notes (Signed)
Follow-up Outpatient Visit Date: 04/05/2019  Primary Care Provider: Lesleigh Noe, MD [address] [id number]  Chief Complaint: Follow-up CAD  HPI:  Mr. Nemetz is a 86 y.o. male with history of coronary artery disease status post PCI to the RCA(03/2018), peripheral vascular disease, hypertension, hyperlipidemia, type 2 diabetes mellitus, and prostate cancer, who presents for follow-up of coronary artery disease.  He sees vascular surgery for management of his PAD.  I met him in 09/2018, at which time he was most concerned about longstanding claudication in both legs.  We obtained bilateral lower extremity vascular studies that severe outflow disease on both sides, which had worsened on the right.  Given stable symptoms, we agreed to continue conservative therapy.  However, he was subsequently referred to vascular surgery by podiatry and underwent percutaneous intervention on the right SFA, popliteal, and peroneal arteries by Dr. Delana Meyer in 11/2018.  Today, Mr. Beever reports he is feeling relatively well.  He still has some pain in his knees but reports that his right leg pain has improved significantly following right lower extremity PTA in 11/2018.  He does not have any sores on his feet.  He denies chest pain, shortness of breath, palpitations, and lightheadedness.  He notes mild dependent edema in his legs, stable.  He has experienced some weakness in his right hand and is undergoing evaluation by a hand surgeon.  --------------------------------------------------------------------------------------------------  Cardiovascular History & Procedures: Cardiovascular Problems:  Coronary artery disease status post PCI to the RCA  PAD status post multiple endovascular interventions and amputations of right toes.  Risk Factors:  Known CAD and PAD, hypertension, hyperlipidemia, diabetes mellitus, male gender, and age greater than 79  Cath/PCI:  LHC (03/27/2018, [place]): LMCA  normal.  LAD with 60% proximal and 30% mid vessel stenoses.  LCx with 40% distal stenosis.  RCA with sequential 95% proximal, 70% mid, and 40% distal stenoses.  PCI to proximal and mid RCA using Xience Sierra 2.5 x 38 mm and Xience Sierra 2.5 x 23 mm drug-eluting stents.  CV Surgery:  None available  EP Procedures and Devices:  None  Non-Invasive Evaluation(s):  TTE (04/22/2018, [place]): Normal LV size.  LVEF 50-55% with grade 1 diastolic dysfunction.  Normal RV size and function.  No significant valvular abnormality.  Recent CV Pertinent Labs: Lab Results  Component Value Date   CHOL 116 10/05/2018   HDL 58 10/05/2018   LDLCALC 47 10/05/2018   TRIG 55 10/05/2018   CHOLHDL 2.0 10/05/2018   K 4.5 03/04/2019   MG 2.4 04/21/2014   BUN 33 (H) 03/04/2019   CREATININE 1.57 (H) 03/04/2019    Past medical and surgical history were reviewed and updated in EPIC.  Current Meds  Medication Sig  . allopurinol (ZYLOPRIM) 100 MG tablet TAKE 1 TABLET BY MOUTH EVERY DAY  . amLODipine (NORVASC) 10 MG tablet TAKE 1 TABLET BY MOUTH EVERY DAY  . Ascorbic Acid (VITAMIN C) 1000 MG tablet Take 1,000 mg by mouth daily.  [name] aspirin EC 81 MG tablet Take 81 mg by mouth daily.  [name] atorvastatin (LIPITOR) 40 MG tablet Take 1 tablet (40 mg total) by mouth daily.  . beta carotene 25000 UNIT capsule Take 25,000 Units by mouth daily.  . clopidogrel (PLAVIX) 75 MG tablet Take 1 tablet (75 mg total) by mouth daily.  [name] donepezil (ARICEPT) 10 MG tablet TAKE 1 TABLET BY MOUTH EVERYDAY AT BEDTIME  . doxazosin (CARDURA) 2 MG tablet TAKE 1 TABLET BY MOUTH  EVERY DAY  . ferrous sulfate 325 (65 FE) MG tablet TAKE 1 TABLET BY MOUTH EVERY DAY  . finasteride (PROSCAR) 5 MG tablet Take 1 tablet (5 mg total) by mouth daily.  . Flaxseed, Linseed, (FLAX SEED OIL) 1000 MG CAPS Take 1,000 mg by mouth 2 (two) times a week.   . folic acid (FOLVITE) 620 MCG tablet Take 400 mcg by mouth 2 (two) times a week.   . gabapentin  (NEURONTIN) 300 MG capsule Take 1 capsule (300 mg total) by mouth 2 (two) times daily.  [name] ibuprofen (ADVIL) 200 MG tablet Take 200 mg by mouth every 6 (six) hours as needed for moderate pain.   [name] insulin glargine (LANTUS) 100 UNIT/ML injection Inject 0.2 mLs (20 Units total) into the skin at bedtime.  . Insulin Syringe-Needle U-100 (TRUEPLUS INSULIN SYRINGE) 31G X 5/16" 1 ML MISC Use daily as directed. Dispense needles as prescribed in the past. DX: E11.22  . losartan (COZAAR) 100 MG tablet Take 100 mg by mouth daily.  . memantine (NAMENDA) 10 MG tablet TAKE 1 TABLET BY MOUTH TWICE A DAY  . Omega-3 Fatty Acids (FISH OIL) 500 MG CAPS Take 500 mg by mouth 2 (two) times a week.   . Povidone-Iodine (BETADINE EX) Apply topically.  . selenium 50 MCG TABS tablet Take 50 mcg by mouth 2 (two) times a week.   . sertraline (ZOLOFT) 25 MG tablet TAKE 1 TABLET BY MOUTH EVERY DAY    Allergies: Penicillins  Social History   Tobacco Use  . Smoking status: Former Smoker    Packs/day: 0.75    Years: 12.00    Pack years: 9.00    Types: Cigars, Cigarettes    Quit date: 1970    Years since quitting: 51.1  . Smokeless tobacco: Former Systems developer    Types: [place] date: 2016  Substance Use Topics  . Alcohol use: Yes    Alcohol/week: 2.0 standard drinks    Types: 2 Standard drinks or equivalent per week  . Drug use: No    Family History  Problem Relation Age of Onset  . Diabetes Mother   . Hypertension Mother   . Diabetes Father   . Dementia Father   . Healthy Daughter   . Healthy Daughter     Review of Systems: A 12-system review of systems was performed and was negative except as noted in the HPI.  --------------------------------------------------------------------------------------------------  Physical Exam: BP 140/60 (BP Location: Left Arm, Patient Position: Sitting, Cuff Size: Normal)   Pulse 62   Ht '5\' 6"'  (1.676 m)   Wt 184 lb 4 oz (83.6 kg)   SpO2 98%   BMI 29.74 kg/m    General: NAD.  Accompanied by his wife. HEENT: No conjunctival pallor or scleral icterus. Facemask in place. Neck: No JVD or HJR. Lungs: Normal work of breathing. Clear to auscultation bilaterally without wheezes or crackles. Heart: Regular rate and rhythm without murmurs, rubs, or gallops. Non-displaced PMI. Abd: Bowel sounds present. Soft, NT/ND without hepatosplenomegaly Ext: Trace pretibial edema bilaterally.  Trace pedal pulses bilaterally. Skin: Warm and dry without rash.  EKG: Normal sinus rhythm with lateral T wave inversions.  No significant change from prior tracing on 09/14/2018.  Lab Results  Component Value Date   WBC 7.5 06/05/2018   HGB 10.9 (L) 06/05/2018   HCT 33.6 (L) 06/05/2018   MCV 83.4 06/05/2018   PLT 301 06/05/2018    Lab Results  Component Value Date   NA  139 03/04/2019   K 4.5 03/04/2019   CL 107 03/04/2019   CO2 26 03/04/2019   BUN 33 (H) 03/04/2019   CREATININE 1.57 (H) 03/04/2019   GLUCOSE 109 (H) 03/04/2019   ALT 28 03/04/2019    Lab Results  Component Value Date   CHOL 116 10/05/2018   HDL 58 10/05/2018   LDLCALC 47 10/05/2018   TRIG 55 10/05/2018   CHOLHDL 2.0 10/05/2018    --------------------------------------------------------------------------------------------------  ASSESSMENT AND PLAN: Coronary artery disease: No symptoms to suggest worsening coronary insufficiency.  Lateral T wave inversions on today's EKG are unchanged from prior tracing.  We have agreed to continue current medications for secondary prevention including continue dual antiplatelet therapy given significant coronary and peripheral vascular disease.  I think it would be reasonable to discontinue clopidogrel temporarily for invasive procedures, though if they will be performed in the near future, I recommend that Mr. Nova also touch base with vascular surgery to ensure that they feel this is appropriate.  Peripheral vascular disease: Improved claudication  following PTA to the right SFA, popliteal, and peroneal arteries in 11/2018.  Continue aggressive secondary prevention and follow-up with vascular surgery.  Hypertension: Blood pressure suboptimally controlled (goal less than 130/80).  He is on maximum doses of amlodipine and losartan at this time.  We have agreed to increase doxazosin to 4 mg nightly.  Hyperlipidemia: Lipids well controlled with LDL 47 on last check in 09/2018.  Continue atorvastatin 40 mg daily.  Follow-up: Return to clinic in 6 months.  [name], MD 04/05/2019 2:17 PM

## 2019-04-05 ENCOUNTER — Encounter: Payer: Self-pay | Admitting: Internal Medicine

## 2019-04-05 ENCOUNTER — Other Ambulatory Visit: Payer: Self-pay

## 2019-04-05 ENCOUNTER — Ambulatory Visit (INDEPENDENT_AMBULATORY_CARE_PROVIDER_SITE_OTHER): Payer: Medicare Other | Admitting: Internal Medicine

## 2019-04-05 VITALS — BP 140/60 | HR 62 | Ht 66.0 in | Wt 184.2 lb

## 2019-04-05 DIAGNOSIS — I10 Essential (primary) hypertension: Secondary | ICD-10-CM | POA: Diagnosis not present

## 2019-04-05 DIAGNOSIS — I2510 Atherosclerotic heart disease of native coronary artery without angina pectoris: Secondary | ICD-10-CM

## 2019-04-05 DIAGNOSIS — E785 Hyperlipidemia, unspecified: Secondary | ICD-10-CM

## 2019-04-05 DIAGNOSIS — I739 Peripheral vascular disease, unspecified: Secondary | ICD-10-CM

## 2019-04-05 DIAGNOSIS — H2513 Age-related nuclear cataract, bilateral: Secondary | ICD-10-CM | POA: Diagnosis not present

## 2019-04-05 LAB — HM DIABETES EYE EXAM

## 2019-04-05 MED ORDER — DOXAZOSIN MESYLATE 4 MG PO TABS: 4.0000 mg | ORAL_TABLET | Freq: Every day | ORAL | 2 refills | Status: DC

## 2019-04-05 NOTE — Patient Instructions (Signed)
Medication Instructions:  Your physician has recommended you make the following change in your medication:  1- INCREASE Doxazosin to 4 mg by mouth daily at bedtime.   *If you need a refill on your cardiac medications before your next appointment, please call your pharmacy*  Lab Work: none If you have labs (blood work) drawn today and your tests are completely normal, you will receive your results only by: [name] MyChart Message (if you have MyChart) OR . A paper copy in the mail If you have any lab test that is abnormal or we need to change your treatment, we will call you to review the results.  Testing/Procedures: none  Follow-Up: At [facility], you and your health needs are our priority.  As part of our continuing mission to provide you with exceptional heart care, we have created designated Provider Care Teams.  These Care Teams include your primary Cardiologist (physician) and Advanced Practice Providers (APPs -  Physician Assistants and Nurse Practitioners) who all work together to provide you with the care you need, when you need it.  Your next appointment:   6 month(s)  The format for your next appointment:   In Person  Provider:    You may see [name], MD or one of the following Advanced Practice Providers on your designated Care Team:    Murray Hodgkins, NP  Christell Faith, PA-C  Marrianne Mood, PA-C

## 2019-04-06 ENCOUNTER — Encounter: Payer: Self-pay | Admitting: Internal Medicine

## 2019-04-06 DIAGNOSIS — G5621 Lesion of ulnar nerve, right upper limb: Secondary | ICD-10-CM | POA: Diagnosis not present

## 2019-04-06 DIAGNOSIS — R2681 Unsteadiness on feet: Secondary | ICD-10-CM | POA: Diagnosis not present

## 2019-04-06 DIAGNOSIS — G5622 Lesion of ulnar nerve, left upper limb: Secondary | ICD-10-CM | POA: Diagnosis not present

## 2019-04-06 DIAGNOSIS — M65341 Trigger finger, right ring finger: Secondary | ICD-10-CM | POA: Diagnosis not present

## 2019-04-06 DIAGNOSIS — I739 Peripheral vascular disease, unspecified: Secondary | ICD-10-CM | POA: Insufficient documentation

## 2019-04-07 ENCOUNTER — Encounter: Payer: Self-pay | Admitting: Ophthalmology

## 2019-04-09 DIAGNOSIS — R2681 Unsteadiness on feet: Secondary | ICD-10-CM | POA: Diagnosis not present

## 2019-04-14 ENCOUNTER — Ambulatory Visit (INDEPENDENT_AMBULATORY_CARE_PROVIDER_SITE_OTHER): Payer: Medicare Other | Admitting: Surgery

## 2019-04-14 ENCOUNTER — Other Ambulatory Visit: Payer: Self-pay

## 2019-04-14 ENCOUNTER — Encounter: Payer: Self-pay | Admitting: Surgery

## 2019-04-14 VITALS — BP 166/53 | HR 68 | Temp 97.5°F | Resp 14 | Ht 67.0 in | Wt 179.0 lb

## 2019-04-14 DIAGNOSIS — I70223 Atherosclerosis of native arteries of extremities with rest pain, bilateral legs: Secondary | ICD-10-CM

## 2019-04-14 DIAGNOSIS — K810 Acute cholecystitis: Secondary | ICD-10-CM | POA: Diagnosis not present

## 2019-04-14 NOTE — Patient Instructions (Signed)
Follow up as needed. Please call the office if you have any questions or concerns.   Cholecystitis  Cholecystitis is inflammation of the gallbladder. It is often called a gallbladder attack. The gallbladder is a pear-shaped organ that lies beneath the liver on the right side of the body. The gallbladder stores bile, which is a fluid that helps the body digest fats. If bile builds up in your gallbladder, your gallbladder becomes inflamed. This condition may occur suddenly. Cholecystitis is a serious condition and requires treatment. What are the causes? The most common cause of this condition is gallstones. Gallstones can block the tube (duct) that carries bile out of your gallbladder. This causes bile to build up. Other causes include:  Damage to the gallbladder due to a decrease in blood flow.  Infections in the bile ducts.  Scars or kinks in the bile ducts.  Tumors in the liver, pancreas, or gallbladder. What increases the risk? You are more likely to develop this condition if:  You have sickle cell disease.  You take birth control pills or use estrogen.  You have alcoholic liver disease.  You have liver cirrhosis.  You have your nutrition delivered through a vein (parenteral nutrition).  You are critically ill.  You do not eat or drink for a long time. This is also called "fasting."  You are obese.  You lose weight too fast.  You are pregnant.  You have high levels of fat (triglycerides) in the blood.  You have pancreatitis. What are the signs or symptoms? Symptoms of this condition include:  Pain in the abdomen, especially in the upper right area of the abdomen.  Tenderness or bloating in the abdomen.  Nausea.  Vomiting.  Fever.  Chills. How is this diagnosed? This condition is diagnosed with a medical history and physical exam. You may also have other tests, including:  Imaging tests, such as: ? An ultrasound of the gallbladder. ? A CT scan of the  abdomen. ? A gallbladder nuclear scan (HIDA scan). This scan allows your health care provider to see the bile moving from your liver to your gallbladder and on to your small intestine. ? MRI.  Blood tests, such as: ? A complete blood count. The white blood cell count may be higher than normal. ? Liver function tests. Certain types of gallstones cause some results to be higher than normal. How is this treated? Treatment may include:  Surgery to remove your gallbladder (cholecystectomy).  Antibiotic medicine, usually through an IV.  Fasting for a certain amount of time.  Giving IV fluids.  Medicine to treat pain or vomiting. Follow these instructions at home:  If you had surgery, follow instructions from your health care provider about home care after the procedure. Medicines   Take over-the-counter and prescription medicines only as told by your health care provider.  If you were prescribed an antibiotic medicine, take it as told by your health care provider. Do not stop taking the antibiotic even if you start to feel better. General instructions  Follow instructions from your health care provider about what to eat or drink. When you are allowed to eat, avoid eating or drinking anything that triggers your symptoms.  Do not lift anything that is heavier than 10 lb (4.5 kg), or the limit that you are told, until your health care provider says that it is safe.  Do not use any products that contain nicotine or tobacco, such as cigarettes and e-cigarettes. If you need help quitting, ask your  health care provider.  Keep all follow-up visits as told by your health care provider. This is important. Contact a health care provider if:  Your pain is not controlled with medicine.  You have a fever. Get help right away if:  Your pain moves to another part of your abdomen or to your back.  You continue to have symptoms or you develop new symptoms even with  treatment. Summary  Cholecystitis is inflammation of the gallbladder.  The most common cause of this condition is gallstones. Gallstones can block the tube (duct) that carries bile out of your gallbladder.  Common symptoms are pain in the abdomen, nausea, vomiting, fever, and chills.  This condition is treated with surgery to remove the gallbladder, medicines, fasting, and IV fluids.  Follow your health care provider's instructions for eating and drinking. Avoid eating anything that triggers your symptoms. This information is not intended to replace advice given to you by your health care provider. Make sure you discuss any questions you have with your health care provider. Document Revised: 06/06/2017 Document Reviewed: 06/06/2017 Elsevier Patient Education  [place].

## 2019-04-15 DIAGNOSIS — M5126 Other intervertebral disc displacement, lumbar region: Secondary | ICD-10-CM | POA: Diagnosis not present

## 2019-04-15 DIAGNOSIS — M48061 Spinal stenosis, lumbar region without neurogenic claudication: Secondary | ICD-10-CM | POA: Diagnosis not present

## 2019-04-15 DIAGNOSIS — M47816 Spondylosis without myelopathy or radiculopathy, lumbar region: Secondary | ICD-10-CM | POA: Diagnosis not present

## 2019-04-16 DIAGNOSIS — R2681 Unsteadiness on feet: Secondary | ICD-10-CM | POA: Diagnosis not present
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
## 2019-04-19 DIAGNOSIS — G5621 Lesion of ulnar nerve, right upper limb: Secondary | ICD-10-CM | POA: Diagnosis not present

## 2019-04-21 ENCOUNTER — Encounter: Payer: Self-pay | Admitting: Ophthalmology

## 2019-04-22 ENCOUNTER — Other Ambulatory Visit: Payer: Self-pay | Admitting: Family Medicine

## 2019-04-22 DIAGNOSIS — Z434 Encounter for attention to other artificial openings of digestive tract: Secondary | ICD-10-CM

## 2019-04-22 DIAGNOSIS — M47816 Spondylosis without myelopathy or radiculopathy, lumbar region: Secondary | ICD-10-CM | POA: Diagnosis not present

## 2019-04-22 DIAGNOSIS — M48061 Spinal stenosis, lumbar region without neurogenic claudication: Secondary | ICD-10-CM | POA: Diagnosis not present

## 2019-04-22 NOTE — Telephone Encounter (Signed)
Per recent urology notes this medication has been D/C

## 2019-04-23 DIAGNOSIS — R2681 Unsteadiness on feet: Secondary | ICD-10-CM | POA: Diagnosis not present

## 2019-04-27 ENCOUNTER — Telehealth: Payer: Self-pay | Admitting: Urology

## 2019-04-27 NOTE — Telephone Encounter (Signed)
Pt wife states the medication is not helping his incontinence, it has gotten worse.  Please advise.

## 2019-04-28 NOTE — Telephone Encounter (Signed)
I would just recommend that they follow-up as scheduled.   He was started on finasteride only 1 month ago which is not been nearly enough time to know if this going to be effective.  I also do not want to give him Myrbetriq until I know he is emptying his bladder little bit better, he had an elevated PVR at last visit.  They can come back sooner follow-up in May as scheduled if he is emptying better (PVR) we can try to add a medication like Myrbetriq at that time.  Hollice Espy, MD

## 2019-04-28 NOTE — Telephone Encounter (Signed)
Spoke with wife, she understands this is a trial process. His incontinence has been worsening. Denies any other urinary symptoms. Would like to know if there is any other recommendations for an alternate medication? Offered follow up appointment, declined.

## 2019-04-29 NOTE — Telephone Encounter (Signed)
Wife Peter [name] informed, verbalized understanding. Will make an appointment if needed sooner.

## 2019-04-30 DIAGNOSIS — R2681 Unsteadiness on feet: Secondary | ICD-10-CM | POA: Diagnosis not present

## 2019-05-04 DIAGNOSIS — R2681 Unsteadiness on feet: Secondary | ICD-10-CM | POA: Diagnosis not present

## 2019-05-04 DIAGNOSIS — B029 Zoster without complications: Secondary | ICD-10-CM | POA: Diagnosis not present

## 2019-05-08 ENCOUNTER — Other Ambulatory Visit: Payer: Self-pay | Admitting: Family Medicine

## 2019-05-10 DIAGNOSIS — M47816 Spondylosis without myelopathy or radiculopathy, lumbar region: Secondary | ICD-10-CM | POA: Diagnosis not present

## 2019-05-26 DIAGNOSIS — M545 Low back pain: Secondary | ICD-10-CM | POA: Diagnosis not present

## 2019-05-26 DIAGNOSIS — M48061 Spinal stenosis, lumbar region without neurogenic claudication: Secondary | ICD-10-CM | POA: Diagnosis not present

## 2019-05-31 ENCOUNTER — Ambulatory Visit (INDEPENDENT_AMBULATORY_CARE_PROVIDER_SITE_OTHER): Payer: Medicare Other

## 2019-05-31 VITALS — Wt 178.0 lb

## 2019-05-31 DIAGNOSIS — Z0000 Encounter for general adult medical examination without abnormal findings: Secondary | ICD-10-CM | POA: Diagnosis not present

## 2019-05-31 NOTE — Progress Notes (Signed)
PCP notes:  Health Maintenance: No gaps noted    Abnormal Screenings: none   Patient concerns: none   Nurse concerns: none   Next PCP appt: none 

## 2019-05-31 NOTE — Patient Instructions (Signed)
[name] , Thank you for taking time to come for your Medicare Wellness Visit. I appreciate your ongoing commitment to your health goals. Please review the following plan we discussed and let me know if I can assist you in the future.   Screening recommendations/referrals: Colonoscopy: no longer required Recommended yearly ophthalmology/optometry visit for glaucoma screening and checkup Recommended yearly dental visit for hygiene and checkup  Vaccinations: Influenza vaccine: Up to date, completed 11/26/2018 Pneumococcal vaccine: Completed series Tdap vaccine: Up to date, completed 11/21/2016 Shingles vaccine: discussed    Advanced directives: Advance directive discussed with you today. Even though you declined this today please call our office should you change your mind and we can give you the proper paperwork for you to fill out.  Conditions/risks identified: diabetes, hypertension, hyperlipidemia  Next appointment: none  Preventive Care 84 Years and Older, Male Preventive care refers to lifestyle choices and visits with your health care provider that can promote health and wellness. What does preventive care include?  A yearly physical exam. This is also called an annual well check.  Dental exams once or twice a year.  Routine eye exams. Ask your health care provider how often you should have your eyes checked.  Personal lifestyle choices, including:  Daily care of your teeth and gums.  Regular physical activity.  Eating a healthy diet.  Avoiding tobacco and drug use.  Limiting alcohol use.  Practicing safe sex.  Taking low doses of aspirin every day.  Taking vitamin and mineral supplements as recommended by your health care provider. What happens during an annual well check? The services and screenings done by your health care provider during your annual well check will depend on your age, overall health, lifestyle risk factors, and family history of  disease. Counseling  Your health care provider may ask you questions about your:  Alcohol use.  Tobacco use.  Drug use.  Emotional well-being.  Home and relationship well-being.  Sexual activity.  Eating habits.  History of falls.  Memory and ability to understand (cognition).  Work and work Statistician. Screening  You may have the following tests or measurements:  Height, weight, and BMI.  Blood pressure.  Lipid and cholesterol levels. These may be checked every 5 years, or more frequently if you are over 38 years old.  Skin check.  Lung cancer screening. You may have this screening every year starting at age 28 if you have a 30-pack-year history of smoking and currently smoke or have quit within the past 15 years.  Fecal occult blood test (FOBT) of the stool. You may have this test every year starting at age 71.  Flexible sigmoidoscopy or colonoscopy. You may have a sigmoidoscopy every 5 years or a colonoscopy every 10 years starting at age 50.  Prostate cancer screening. Recommendations will vary depending on your family history and other risks.  Hepatitis C blood test.  Hepatitis B blood test.  Sexually transmitted disease (STD) testing.  Diabetes screening. This is done by checking your blood sugar (glucose) after you have not eaten for a while (fasting). You may have this done every 1-3 years.  Abdominal aortic aneurysm (AAA) screening. You may need this if you are a current or former smoker.  Osteoporosis. You may be screened starting at age 82 if you are at high risk. Talk with your health care provider about your test results, treatment options, and if necessary, the need for more tests. Vaccines  Your health care provider may recommend certain vaccines,  such as:  Influenza vaccine. This is recommended every year.  Tetanus, diphtheria, and acellular pertussis (Tdap, Td) vaccine. You may need a Td booster every 10 years.  Zoster vaccine. You may  need this after age 68.  Pneumococcal 13-valent conjugate (PCV13) vaccine. One dose is recommended after age 25.  Pneumococcal polysaccharide (PPSV23) vaccine. One dose is recommended after age 52. Talk to your health care provider about which screenings and vaccines you need and how often you need them. This information is not intended to replace advice given to you by your health care provider. Make sure you discuss any questions you have with your health care provider. Document Released: 02/24/2015 Document Revised: 10/18/2015 Document Reviewed: 11/29/2014 Elsevier Interactive Patient Education  2017 [place] Prevention in the Home Falls can cause injuries. They can happen to people of all ages. There are many things you can do to make your home safe and to help prevent falls. What can I do on the outside of my home?  Regularly fix the edges of walkways and driveways and fix any cracks.  Remove anything that might make you trip as you walk through a door, such as a raised step or threshold.  Trim any bushes or trees on the path to your home.  Use bright outdoor lighting.  Clear any walking paths of anything that might make someone trip, such as rocks or tools.  Regularly check to see if handrails are loose or broken. Make sure that both sides of any steps have handrails.  Any raised decks and porches should have guardrails on the edges.  Have any leaves, snow, or ice cleared regularly.  Use sand or salt on walking paths during winter.  Clean up any spills in your garage right away. This includes oil or grease spills. What can I do in the bathroom?  Use night lights.  Install grab bars by the toilet and in the tub and shower. Do not use towel bars as grab bars.  Use non-skid mats or decals in the tub or shower.  If you need to sit down in the shower, use a plastic, non-slip stool.  Keep the floor dry. Clean up any water that spills on the floor as soon as it  happens.  Remove soap buildup in the tub or shower regularly.  Attach bath mats securely with double-sided non-slip rug tape.  Do not have throw rugs and other things on the floor that can make you trip. What can I do in the bedroom?  Use night lights.  Make sure that you have a light by your bed that is easy to reach.  Do not use any sheets or blankets that are too big for your bed. They should not hang down onto the floor.  Have a firm chair that has side arms. You can use this for support while you get dressed.  Do not have throw rugs and other things on the floor that can make you trip. What can I do in the kitchen?  Clean up any spills right away.  Avoid walking on wet floors.  Keep items that you use a lot in easy-to-reach places.  If you need to reach something above you, use a strong step stool that has a grab bar.  Keep electrical cords out of the way.  Do not use floor polish or wax that makes floors slippery. If you must use wax, use non-skid floor wax.  Do not have throw rugs and other things on  the floor that can make you trip. What can I do with my stairs?  Do not leave any items on the stairs.  Make sure that there are handrails on both sides of the stairs and use them. Fix handrails that are broken or loose. Make sure that handrails are as long as the stairways.  Check any carpeting to make sure that it is firmly attached to the stairs. Fix any carpet that is loose or worn.  Avoid having throw rugs at the top or bottom of the stairs. If you do have throw rugs, attach them to the floor with carpet tape.  Make sure that you have a light switch at the top of the stairs and the bottom of the stairs. If you do not have them, ask someone to add them for you. What else can I do to help prevent falls?  Wear shoes that:  Do not have high heels.  Have rubber bottoms.  Are comfortable and fit you well.  Are closed at the toe. Do not wear sandals.  If you  use a stepladder:  Make sure that it is fully opened. Do not climb a closed stepladder.  Make sure that both sides of the stepladder are locked into place.  Ask someone to hold it for you, if possible.  Clearly mark and make sure that you can see:  Any grab bars or handrails.  First and last steps.  Where the edge of each step is.  Use tools that help you move around (mobility aids) if they are needed. These include:  Canes.  Walkers.  Scooters.  Crutches.  Turn on the lights when you go into a dark area. Replace any light bulbs as soon as they burn out.  Set up your furniture so you have a clear path. Avoid moving your furniture around.  If any of your floors are uneven, fix them.  If there are any pets around you, be aware of where they are.  Review your medicines with your doctor. Some medicines can make you feel dizzy. This can increase your chance of falling. Ask your doctor what other things that you can do to help prevent falls. This information is not intended to replace advice given to you by your health care provider. Make sure you discuss any questions you have with your health care provider. Document Released: 11/24/2008 Document Revised: 07/06/2015 Document Reviewed: 03/04/2014 Elsevier Interactive Patient Education  2017 [employer].
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
## 2019-06-01 DIAGNOSIS — R2681 Unsteadiness on feet: Secondary | ICD-10-CM | POA: Diagnosis not present

## 2019-06-03 ENCOUNTER — Telehealth: Payer: Self-pay | Admitting: Family Medicine

## 2019-06-03 DIAGNOSIS — M545 Low back pain, unspecified: Secondary | ICD-10-CM

## 2019-06-03 DIAGNOSIS — G8929 Other chronic pain: Secondary | ICD-10-CM

## 2019-06-03 NOTE — Telephone Encounter (Signed)
Able to place referral for acupuncture but unable to find order in the system.   If referral not appropriate, please see if they will accept a verbal order for acupuncture as needed.

## 2019-06-03 NOTE — Telephone Encounter (Signed)
Referral faxed over to [facility] and will change the order if needed after they review. Vaughan Basta, patient's wife, advised.

## 2019-06-03 NOTE — Telephone Encounter (Signed)
Patient's wife called today. She stated that the patient is doing Physical Therapy but would like to have the acupuncture done. [facility] stated they could do it but will need a script/order from Dr Einar Pheasant to do this

## 2019-06-04 DIAGNOSIS — R2681 Unsteadiness on feet: Secondary | ICD-10-CM | POA: Diagnosis not present

## 2019-06-09 DIAGNOSIS — R2681 Unsteadiness on feet: Secondary | ICD-10-CM | POA: Diagnosis not present

## 2019-06-09 NOTE — Telephone Encounter (Signed)
New request from [facility] that they are doing dry needling.   Unable to find electronic prescription.   Verbal Order. OK for dry needling as needed per Physical Therapist.

## 2019-06-10 NOTE — Addendum Note (Signed)
Addended by: Kris Mouton on: 06/10/2019 10:37 AM   Modules accepted: Orders

## 2019-06-10 NOTE — Telephone Encounter (Signed)
Could not get in touch with someone at [facility] PT office. I went ahead and placed referral for [facility] and added in the comments for dry needling service. FYI to Dr Einar Pheasant. I have faxed this information over to [facility] PT office.

## 2019-06-11 DIAGNOSIS — R2681 Unsteadiness on feet: Secondary | ICD-10-CM | POA: Diagnosis not present

## 2019-06-15 ENCOUNTER — Encounter: Payer: Self-pay | Admitting: Family Medicine

## 2019-06-15 ENCOUNTER — Other Ambulatory Visit: Payer: Self-pay

## 2019-06-15 ENCOUNTER — Ambulatory Visit (INDEPENDENT_AMBULATORY_CARE_PROVIDER_SITE_OTHER): Payer: Medicare Other | Admitting: Family Medicine

## 2019-06-15 VITALS — BP 122/54 | HR 55 | Temp 98.7°F | Resp 18 | Ht 66.0 in | Wt 177.5 lb

## 2019-06-15 DIAGNOSIS — Z89421 Acquired absence of other right toe(s): Secondary | ICD-10-CM

## 2019-06-15 DIAGNOSIS — N183 Chronic kidney disease, stage 3 unspecified: Secondary | ICD-10-CM | POA: Diagnosis not present

## 2019-06-15 DIAGNOSIS — G63 Polyneuropathy in diseases classified elsewhere: Secondary | ICD-10-CM

## 2019-06-15 DIAGNOSIS — R29898 Other symptoms and signs involving the musculoskeletal system: Secondary | ICD-10-CM | POA: Insufficient documentation

## 2019-06-15 DIAGNOSIS — I10 Essential (primary) hypertension: Secondary | ICD-10-CM

## 2019-06-15 DIAGNOSIS — F0390 Unspecified dementia without behavioral disturbance: Secondary | ICD-10-CM | POA: Diagnosis not present

## 2019-06-15 DIAGNOSIS — Z794 Long term (current) use of insulin: Secondary | ICD-10-CM | POA: Diagnosis not present

## 2019-06-15 DIAGNOSIS — E1122 Type 2 diabetes mellitus with diabetic chronic kidney disease: Secondary | ICD-10-CM

## 2019-06-15 LAB — HEMOGLOBIN A1C: Hgb A1c MFr Bld: 6.7 % — ABNORMAL HIGH (ref 4.6–6.5)

## 2019-06-15 LAB — BASIC METABOLIC PANEL
BUN: 34 mg/dL — ABNORMAL HIGH (ref 6–23)
CO2: 27 mEq/L (ref 19–32)
Calcium: 8 mg/dL — ABNORMAL LOW (ref 8.4–10.5)
Chloride: 105 mEq/L (ref 96–112)
Creatinine, Ser: 1.57 mg/dL — ABNORMAL HIGH (ref 0.40–1.50)
GFR: 41.99 mL/min — ABNORMAL LOW (ref >60.00)
Glucose, Bld: 183 mg/dL — ABNORMAL HIGH (ref 70–99)
Potassium: 4.4 mEq/L (ref 3.5–5.1)
Sodium: 137 mEq/L (ref 135–145)

## 2019-06-15 NOTE — Assessment & Plan Note (Signed)
Discussed previous HgbA1c 6.2%, discussed that he could reduce lantus for goal of 110-130 fasting CBG and if interested could try switching to oral agent - though at this time declined and is doing well with insulin management. Does not do great with diabetic diet - encouraged. Lipids under good control. Will repeat BMP today.

## 2019-06-15 NOTE — Progress Notes (Signed)
Subjective:     Peter [name] is a 86 y.o. male presenting for Follow-up (had AWV on 05/31/19)     HPI   #BPH/elevated PSA - elevated psa - seeing urology - no need to intervene   #Diabetes - taking lantus 20 units - will occasionally take some morning - this morning 150 - normally 80-110 for fasting cbg - neuropathy is stable - feels like he may "fall down" - seeing PT   #Dementia - saw neurology previously - has not noticed a specific decline  #back pain - saw pain medication - no suggestions - seeing PT - working on balance, getting dry needling -     Review of Systems   Social History   Tobacco Use  Smoking Status Former Smoker  . Packs/day: 0.75  . Years: 12.00  . Pack years: 9.00  . Types: Cigars, Cigarettes  . Quit date: 81  . Years since quitting: 51.3  Smokeless Tobacco Former Systems developer  . Types: Chew  . Quit date: 2016        Objective:    BP Readings from Last 3 Encounters:  06/15/19 (!) 122/54  04/14/19 (!) 166/53  04/05/19 140/60   Wt Readings from Last 3 Encounters:  06/15/19 177 lb 8 oz (80.5 kg)  05/31/19 178 lb (80.7 kg)  04/14/19 179 lb (81.2 kg)    BP (!) 122/54   Pulse (!) 55   Temp 98.7 F (37.1 C)   Resp 18   Ht 5\' 6"  (1.676 m)   Wt 177 lb 8 oz (80.5 kg)   SpO2 96%   BMI 28.65 kg/m    Physical Exam Constitutional:      Appearance: Normal appearance. He is not ill-appearing or diaphoretic.  HENT:     Right Ear: External ear normal.     Left Ear: External ear normal.     Nose: Nose normal.  Eyes:     General: No scleral icterus.    Extraocular Movements: Extraocular movements intact.     Conjunctiva/sclera: Conjunctivae normal.  Cardiovascular:     Rate and Rhythm: Normal rate and regular rhythm.     Heart sounds: No murmur.  Pulmonary:     Effort: Pulmonary effort is normal. No respiratory distress.     Breath sounds: Normal breath sounds. No wheezing.  Musculoskeletal:     Cervical back: Neck  supple.  Skin:    General: Skin is warm and dry.  Neurological:     Mental Status: He is alert. Mental status is at baseline.  Psychiatric:        Mood and Affect: Mood normal.     Diabetic Foot Exam - Simple   Simple Foot Form Diabetic Foot exam was performed with the following findings: Yes 06/15/2019  9:47 AM  Visual Inspection See comments: Yes Sensation Testing See comments: Yes Pulse Check Posterior Tibialis and Dorsalis pulse intact bilaterally: Yes Comments Right foot with amputation of 3rd and 4th digit. No ulcerations or deformities. Monofilament testing intact only over the lateral edge of the foot.           Assessment & Plan:   Problem List Items Addressed This Visit      Cardiovascular and Mediastinum   Hypertension - Primary    BP at goal today. Continue current medications.       Relevant Orders   Basic metabolic panel     Endocrine   DM (diabetes mellitus) type II controlled with renal manifestation ([place])  Discussed previous HgbA1c 6.2%, discussed that he could reduce lantus for goal of 110-130 fasting CBG and if interested could try switching to oral agent - though at this time declined and is doing well with insulin management. Does not do great with diabetic diet - encouraged. Lipids under good control. Will repeat BMP today.       Relevant Orders   Hemoglobin A1c     Nervous and Auditory   Dementia ([place])    Has been stable on memantine and donezepil, however, has not established with neurology in [place]. Will place referral for continued evaluation and management.       Relevant Orders   Ambulatory referral to Neurology   Polyneuropathy associated with underlying disease ([place])    Stable per patient. Continue gabapentin. No new sores, cont home skin checks        Other   S/P amputation of lesser toe, right (HCC)   Hand weakness    He has noticed some intrinisic muscle weakness of his hands. Overall opposition seems to be intact but does  have some muscle difference in the right vs left. Neurology referral to further evaluate.       Relevant Orders   Ambulatory referral to Neurology       Return in about 6 months (around 12/16/2019).  Lesleigh Noe, MD

## 2019-06-15 NOTE — Assessment & Plan Note (Signed)
He has noticed some intrinisic muscle weakness of his hands. Overall opposition seems to be intact but does have some muscle difference in the right vs left. Neurology referral to further evaluate.

## 2019-06-15 NOTE — Patient Instructions (Addendum)
#  HTN - blood pressure looks good - continue medications  #Dementia - neurology referral   #Diabetes - will check hemoglobin a1c today - may consider switching to oral option in the future

## 2019-06-15 NOTE — Assessment & Plan Note (Signed)
BP at goal today. Continue current medications.

## 2019-06-15 NOTE — Assessment & Plan Note (Signed)
Stable per patient. Continue gabapentin. No new sores, cont home skin checks

## 2019-06-15 NOTE — Assessment & Plan Note (Addendum)
Has been stable on memantine and donezepil, however, has not established with neurology in [place]. Will place referral for continued evaluation and management.

## 2019-06-16 DIAGNOSIS — R2681 Unsteadiness on feet: Secondary | ICD-10-CM | POA: Diagnosis not present

## 2019-06-22 DIAGNOSIS — R2681 Unsteadiness on feet: Secondary | ICD-10-CM | POA: Diagnosis not present

## 2019-06-22 DIAGNOSIS — M545 Low back pain: Secondary | ICD-10-CM | POA: Diagnosis not present

## 2019-06-24 DIAGNOSIS — R2681 Unsteadiness on feet: Secondary | ICD-10-CM | POA: Diagnosis not present

## 2019-06-27 ENCOUNTER — Emergency Department: Payer: Medicare Other

## 2019-06-27 ENCOUNTER — Observation Stay
Admission: EM | Admit: 2019-06-27 | Discharge: 2019-06-27 | Disposition: A | Discharge status: Home / Self Care | Payer: Medicare Other | Attending: Internal Medicine | Admitting: Internal Medicine

## 2019-06-27 ENCOUNTER — Encounter: Payer: Self-pay | Admitting: Emergency Medicine

## 2019-06-27 ENCOUNTER — Other Ambulatory Visit: Payer: Self-pay

## 2019-06-27 ENCOUNTER — Observation Stay (HOSPITAL_BASED_OUTPATIENT_CLINIC_OR_DEPARTMENT_OTHER)
Admit: 2019-06-27 | Discharge: 2019-06-27 | Disposition: A | Discharge status: Home / Self Care | Payer: Medicare Other | Attending: Cardiovascular Disease | Admitting: Cardiovascular Disease

## 2019-06-27 DIAGNOSIS — I252 Old myocardial infarction: Secondary | ICD-10-CM | POA: Insufficient documentation

## 2019-06-27 DIAGNOSIS — R0789 Other chest pain: Secondary | ICD-10-CM

## 2019-06-27 DIAGNOSIS — Z7902 Long term (current) use of antithrombotics/antiplatelets: Secondary | ICD-10-CM | POA: Diagnosis not present

## 2019-06-27 DIAGNOSIS — I25118 Atherosclerotic heart disease of native coronary artery with other forms of angina pectoris: Secondary | ICD-10-CM | POA: Diagnosis not present

## 2019-06-27 DIAGNOSIS — I200 Unstable angina: Secondary | ICD-10-CM

## 2019-06-27 DIAGNOSIS — I340 Nonrheumatic mitral (valve) insufficiency: Secondary | ICD-10-CM

## 2019-06-27 DIAGNOSIS — Z20822 Contact with and (suspected) exposure to covid-19: Secondary | ICD-10-CM | POA: Insufficient documentation

## 2019-06-27 DIAGNOSIS — D649 Anemia, unspecified: Secondary | ICD-10-CM | POA: Diagnosis not present

## 2019-06-27 DIAGNOSIS — F0390 Unspecified dementia without behavioral disturbance: Secondary | ICD-10-CM | POA: Insufficient documentation

## 2019-06-27 DIAGNOSIS — I129 Hypertensive chronic kidney disease with stage 1 through stage 4 chronic kidney disease, or unspecified chronic kidney disease: Secondary | ICD-10-CM | POA: Diagnosis not present

## 2019-06-27 DIAGNOSIS — I361 Nonrheumatic tricuspid (valve) insufficiency: Secondary | ICD-10-CM | POA: Diagnosis not present

## 2019-06-27 DIAGNOSIS — M109 Gout, unspecified: Secondary | ICD-10-CM | POA: Insufficient documentation

## 2019-06-27 DIAGNOSIS — R778 Other specified abnormalities of plasma proteins: Secondary | ICD-10-CM

## 2019-06-27 DIAGNOSIS — N189 Chronic kidney disease, unspecified: Secondary | ICD-10-CM | POA: Diagnosis not present

## 2019-06-27 DIAGNOSIS — N183 Chronic kidney disease, stage 3 unspecified: Secondary | ICD-10-CM | POA: Insufficient documentation

## 2019-06-27 DIAGNOSIS — Z8546 Personal history of malignant neoplasm of prostate: Secondary | ICD-10-CM | POA: Insufficient documentation

## 2019-06-27 DIAGNOSIS — E1151 Type 2 diabetes mellitus with diabetic peripheral angiopathy without gangrene: Secondary | ICD-10-CM | POA: Insufficient documentation

## 2019-06-27 DIAGNOSIS — Z7982 Long term (current) use of aspirin: Secondary | ICD-10-CM | POA: Insufficient documentation

## 2019-06-27 DIAGNOSIS — E1122 Type 2 diabetes mellitus with diabetic chronic kidney disease: Secondary | ICD-10-CM | POA: Insufficient documentation

## 2019-06-27 DIAGNOSIS — I2510 Atherosclerotic heart disease of native coronary artery without angina pectoris: Secondary | ICD-10-CM

## 2019-06-27 DIAGNOSIS — Z79899 Other long term (current) drug therapy: Secondary | ICD-10-CM | POA: Diagnosis not present

## 2019-06-27 DIAGNOSIS — E785 Hyperlipidemia, unspecified: Secondary | ICD-10-CM

## 2019-06-27 DIAGNOSIS — Z87891 Personal history of nicotine dependence: Secondary | ICD-10-CM | POA: Insufficient documentation

## 2019-06-27 DIAGNOSIS — N1832 Chronic kidney disease, stage 3b: Secondary | ICD-10-CM | POA: Diagnosis present

## 2019-06-27 DIAGNOSIS — I10 Essential (primary) hypertension: Secondary | ICD-10-CM | POA: Diagnosis present

## 2019-06-27 DIAGNOSIS — R52 Pain, unspecified: Secondary | ICD-10-CM | POA: Diagnosis not present

## 2019-06-27 DIAGNOSIS — R079 Chest pain, unspecified: Secondary | ICD-10-CM | POA: Diagnosis not present

## 2019-06-27 DIAGNOSIS — R7989 Other specified abnormal findings of blood chemistry: Secondary | ICD-10-CM | POA: Diagnosis not present

## 2019-06-27 DIAGNOSIS — E1129 Type 2 diabetes mellitus with other diabetic kidney complication: Secondary | ICD-10-CM | POA: Diagnosis present

## 2019-06-27 DIAGNOSIS — Z794 Long term (current) use of insulin: Secondary | ICD-10-CM | POA: Diagnosis not present

## 2019-06-27 DIAGNOSIS — E782 Mixed hyperlipidemia: Secondary | ICD-10-CM

## 2019-06-27 DIAGNOSIS — F419 Anxiety disorder, unspecified: Secondary | ICD-10-CM | POA: Diagnosis not present

## 2019-06-27 DIAGNOSIS — Z955 Presence of coronary angioplasty implant and graft: Secondary | ICD-10-CM | POA: Insufficient documentation

## 2019-06-27 DIAGNOSIS — I25110 Atherosclerotic heart disease of native coronary artery with unstable angina pectoris: Secondary | ICD-10-CM | POA: Diagnosis not present

## 2019-06-27 LAB — CBC
HCT: 28.1 % — ABNORMAL LOW (ref 39.0–52.0)
Hemoglobin: 9.3 g/dL — ABNORMAL LOW (ref 13.0–17.0)
MCH: 28.6 pg (ref 26.0–34.0)
MCHC: 33.1 g/dL (ref 30.0–36.0)
MCV: 86.5 fL (ref 80.0–100.0)
Platelets: 297 10*3/uL (ref 150–400)
RBC: 3.25 MIL/uL — ABNORMAL LOW (ref 4.22–5.81)
RDW: 14.1 % (ref 11.5–15.5)
WBC: 11.9 10*3/uL — ABNORMAL HIGH (ref 4.0–10.5)
nRBC: 0 % (ref 0.0–0.2)

## 2019-06-27 LAB — COMPREHENSIVE METABOLIC PANEL
ALT: 56 U/L — ABNORMAL HIGH (ref 0–44)
AST: 31 U/L (ref 15–41)
Albumin: 3.3 g/dL — ABNORMAL LOW (ref 3.5–5.0)
Alkaline Phosphatase: 101 U/L (ref 38–126)
Anion gap: 6 (ref 5–15)
BUN: 45 mg/dL — ABNORMAL HIGH (ref 8–23)
CO2: 25 mmol/L (ref 22–32)
Calcium: 8.2 mg/dL — ABNORMAL LOW (ref 8.9–10.3)
Chloride: 109 mmol/L (ref 98–111)
Creatinine, Ser: 1.59 mg/dL — ABNORMAL HIGH (ref 0.61–1.24)
GFR calc Af Amer: 45 mL/min — ABNORMAL LOW (ref >60)
GFR calc non Af Amer: 38 mL/min — ABNORMAL LOW (ref >60)
Glucose, Bld: 192 mg/dL — ABNORMAL HIGH (ref 70–99)
Potassium: 4.3 mmol/L (ref 3.5–5.1)
Sodium: 140 mmol/L (ref 135–145)
Total Bilirubin: 0.6 mg/dL (ref 0.3–1.2)
Total Protein: 6.1 g/dL — ABNORMAL LOW (ref 6.5–8.1)

## 2019-06-27 LAB — LIPID PANEL
Cholesterol: 135 mg/dL (ref 0–200)
HDL: 61 mg/dL (ref >40)
LDL Cholesterol: 62 mg/dL (ref 0–99)
Total CHOL/HDL Ratio: 2.2 RATIO
Triglycerides: 62 mg/dL (ref <150)
VLDL: 12 mg/dL (ref 0–40)

## 2019-06-27 LAB — ECHOCARDIOGRAM COMPLETE
Height: 67 in
Weight: 2752 oz

## 2019-06-27 LAB — TROPONIN I (HIGH SENSITIVITY)
Troponin I (High Sensitivity): 38 ng/L — ABNORMAL HIGH (ref <18)
Troponin I (High Sensitivity): 47 ng/L — ABNORMAL HIGH (ref <18)

## 2019-06-27 LAB — PROTIME-INR
INR: 1 (ref 0.8–1.2)
Prothrombin Time: 12.9 seconds (ref 11.4–15.2)

## 2019-06-27 LAB — GLUCOSE, CAPILLARY
Glucose-Capillary: 148 mg/dL — ABNORMAL HIGH (ref 70–99)
Glucose-Capillary: 108 mg/dL — ABNORMAL HIGH (ref 70–99)

## 2019-06-27 LAB — LIPASE, BLOOD: Lipase: 59 U/L — ABNORMAL HIGH (ref 11–51)

## 2019-06-27 LAB — SARS CORONAVIRUS 2 BY RT PCR (HOSPITAL ORDER, PERFORMED IN ~~LOC~~ HOSPITAL LAB): SARS Coronavirus 2: NEGATIVE

## 2019-06-27 LAB — APTT: aPTT: 32 seconds (ref 24–36)

## 2019-06-27 IMAGING — DX DG CHEST 1V PORT
1 series · 1 of 1 positions shown · non-contrast
Comparison: None.

CLINICAL DATA: Chest pressure

EXAM:
PORTABLE CHEST 1 VIEW

[chest ap]
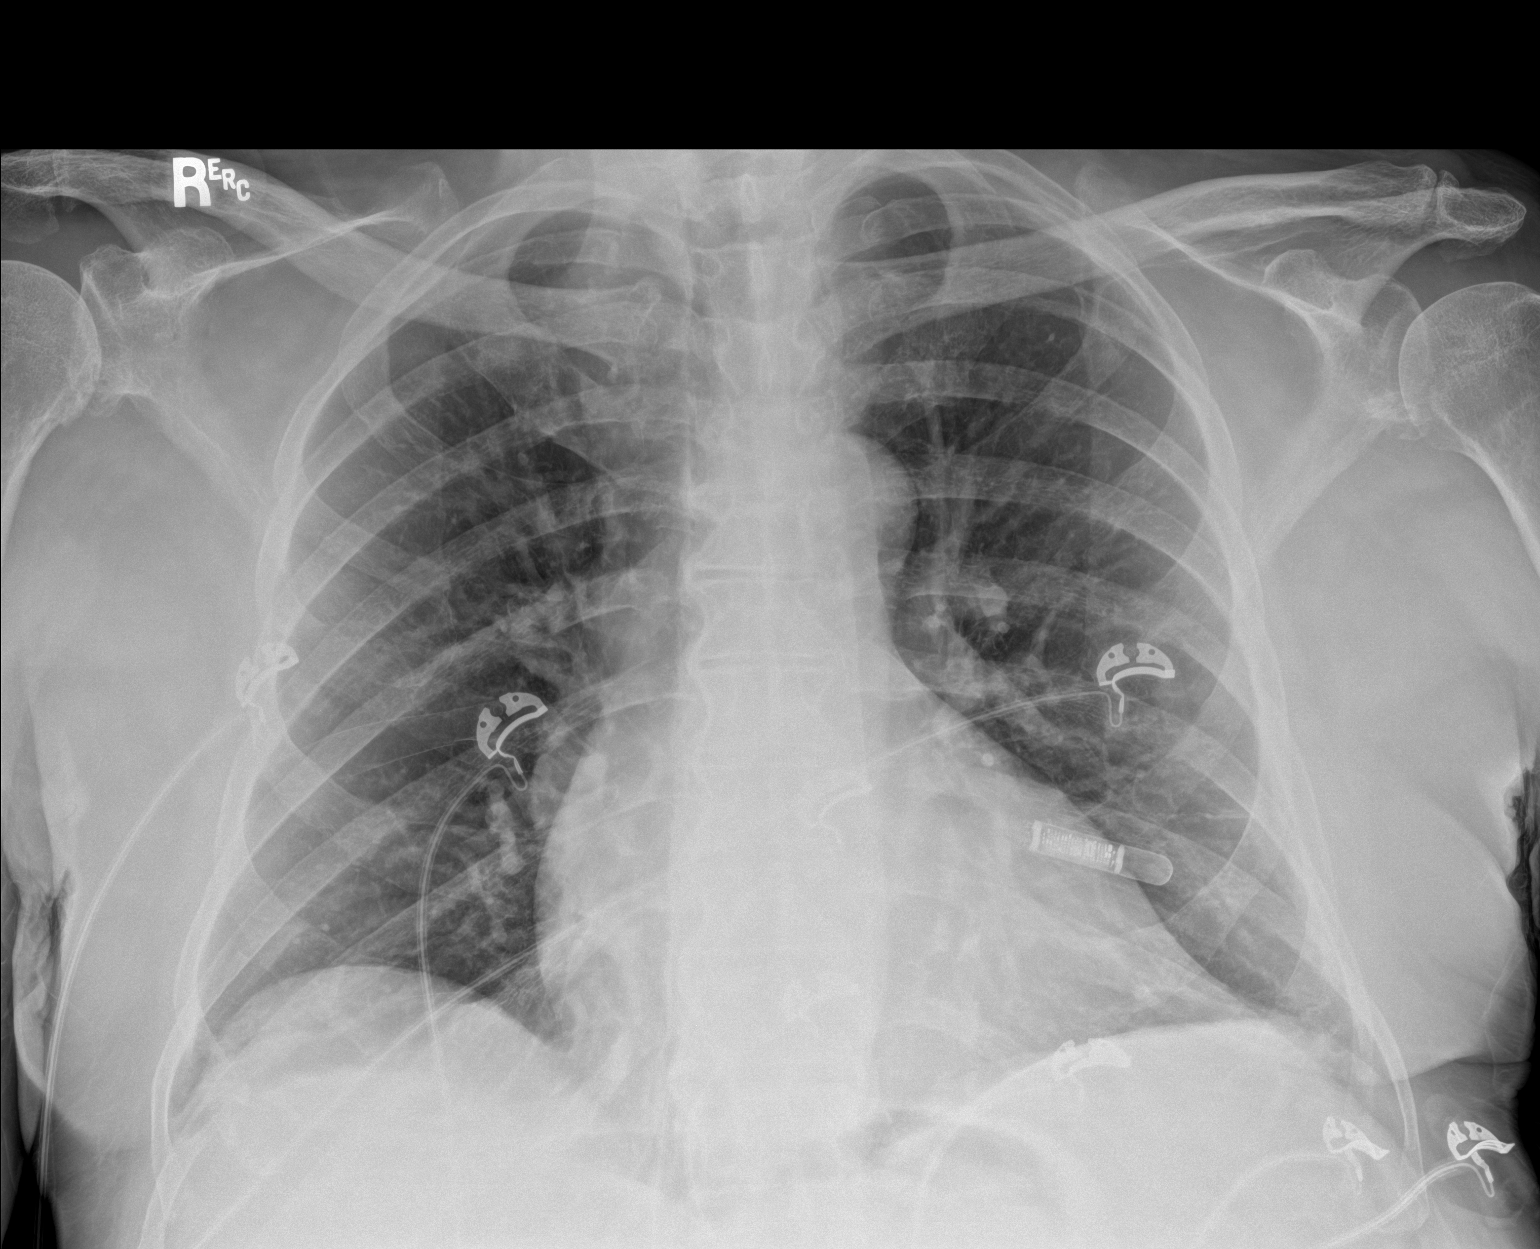

[1 of 1 positions shown; findings below may reference images not displayed]

FINDINGS: The heart size and mediastinal contours are within normal limits.
Both lungs are clear. The visualized skeletal structures are
unremarkable. Recording device over the left lower chest
IMPRESSION: No active disease.

## 2019-06-27 MED ORDER — GABAPENTIN 300 MG PO CAPS
300.0000 mg | ORAL_CAPSULE | Freq: Two times a day (BID) | ORAL | Status: DC
  Administered 2019-06-27: 300 mg via ORAL
  Filled 2019-06-27: qty 1

## 2019-06-27 MED ORDER — INSULIN ASPART 100 UNIT/ML ~~LOC~~ SOLN: 0.0000 [IU] | Freq: Three times a day (TID) | SUBCUTANEOUS | Status: DC

## 2019-06-27 MED ORDER — ATORVASTATIN CALCIUM 20 MG PO TABS: 40.0000 mg | ORAL_TABLET | Freq: Every day | ORAL | Status: DC

## 2019-06-27 MED ORDER — ONDANSETRON HCL 4 MG/2ML IJ SOLN: 4.0000 mg | Freq: Four times a day (QID) | INTRAMUSCULAR | Status: DC | PRN

## 2019-06-27 MED ORDER — DONEPEZIL HCL 5 MG PO TABS: 10.0000 mg | ORAL_TABLET | Freq: Every day | ORAL | Status: DC

## 2019-06-27 MED ORDER — CLOPIDOGREL BISULFATE 75 MG PO TABS
75.0000 mg | ORAL_TABLET | Freq: Every day | ORAL | Status: DC
  Administered 2019-06-27: 75 mg via ORAL
  Filled 2019-06-27: qty 1

## 2019-06-27 MED ORDER — ALLOPURINOL 100 MG PO TABS
100.0000 mg | ORAL_TABLET | Freq: Every day | ORAL | Status: DC
  Administered 2019-06-27: 100 mg via ORAL
  Filled 2019-06-27: qty 1

## 2019-06-27 MED ORDER — SERTRALINE HCL 50 MG PO TABS
25.0000 mg | ORAL_TABLET | Freq: Every day | ORAL | Status: DC
  Administered 2019-06-27: 25 mg via ORAL
  Filled 2019-06-27: qty 1

## 2019-06-27 MED ORDER — FOLIC ACID 400 MCG PO TABS: 400.0000 ug | ORAL_TABLET | ORAL | Status: DC

## 2019-06-27 MED ORDER — FINASTERIDE 5 MG PO TABS
5.0000 mg | ORAL_TABLET | Freq: Every day | ORAL | Status: DC
  Administered 2019-06-27: 5 mg via ORAL
  Filled 2019-06-27: qty 1

## 2019-06-27 MED ORDER — LABETALOL HCL 5 MG/ML IV SOLN
20.0000 mg | Freq: Once | INTRAVENOUS | Status: AC
  Administered 2019-06-27: 20 mg via INTRAVENOUS
  Filled 2019-06-27: qty 4

## 2019-06-27 MED ORDER — ACETAMINOPHEN 325 MG PO TABS: 650.0000 mg | ORAL_TABLET | ORAL | Status: DC | PRN

## 2019-06-27 MED ORDER — LOSARTAN POTASSIUM 50 MG PO TABS
100.0000 mg | ORAL_TABLET | Freq: Every day | ORAL | Status: DC
  Administered 2019-06-27: 100 mg via ORAL
  Filled 2019-06-27: qty 2

## 2019-06-27 MED ORDER — NITROGLYCERIN 0.4 MG SL SUBL: 0.4000 mg | SUBLINGUAL_TABLET | SUBLINGUAL | Status: DC | PRN

## 2019-06-27 MED ORDER — AMLODIPINE BESYLATE 5 MG PO TABS
10.0000 mg | ORAL_TABLET | Freq: Every day | ORAL | Status: DC
  Administered 2019-06-27: 10 mg via ORAL
  Filled 2019-06-27: qty 2

## 2019-06-27 MED ORDER — ASPIRIN EC 81 MG PO TBEC: 81.0000 mg | DELAYED_RELEASE_TABLET | Freq: Every day | ORAL | Status: DC

## 2019-06-27 MED ORDER — INSULIN GLARGINE 100 UNIT/ML ~~LOC~~ SOLN: 12.0000 [IU] | Freq: Every day | SUBCUTANEOUS | Status: DC

## 2019-06-27 MED ORDER — MEMANTINE HCL 5 MG PO TABS
10.0000 mg | ORAL_TABLET | Freq: Two times a day (BID) | ORAL | Status: DC
  Administered 2019-06-27: 10 mg via ORAL
  Filled 2019-06-27: qty 2

## 2019-06-27 MED ORDER — NITROGLYCERIN 0.4 MG SL SUBL: 0.4000 mg | SUBLINGUAL_TABLET | SUBLINGUAL | 0 refills | Status: DC | PRN

## 2019-06-27 MED ORDER — HEPARIN (PORCINE) 25000 UT/250ML-% IV SOLN
950.0000 [IU]/h | INTRAVENOUS | Status: DC
  Administered 2019-06-27: 950 [IU]/h via INTRAVENOUS
  Filled 2019-06-27: qty 250

## 2019-06-27 MED ORDER — ASPIRIN EC 81 MG PO TBEC
81.0000 mg | DELAYED_RELEASE_TABLET | Freq: Every day | ORAL | Status: DC
  Administered 2019-06-27: 81 mg via ORAL
  Filled 2019-06-27: qty 1

## 2019-06-27 MED ORDER — DOXAZOSIN MESYLATE 4 MG PO TABS: 4.0000 mg | ORAL_TABLET | Freq: Every day | ORAL | Status: DC

## 2019-06-27 MED ORDER — HEPARIN BOLUS VIA INFUSION
4000.0000 [IU] | Freq: Once | INTRAVENOUS | Status: AC
  Administered 2019-06-27: 4000 [IU] via INTRAVENOUS
  Filled 2019-06-27: qty 4000

## 2019-06-27 MED ORDER — INSULIN ASPART 100 UNIT/ML ~~LOC~~ SOLN
0.0000 [IU] | SUBCUTANEOUS | Status: DC
  Administered 2019-06-27: 2 [IU] via SUBCUTANEOUS
  Filled 2019-06-27: qty 1

## 2019-06-27 MED ORDER — INSULIN ASPART 100 UNIT/ML ~~LOC~~ SOLN: 0.0000 [IU] | Freq: Every day | SUBCUTANEOUS | Status: DC

## 2019-06-27 NOTE — ED Notes (Signed)
Patient given a warm blanket at this time.  

## 2019-06-27 NOTE — ED Notes (Signed)
Attempted IV twice not successful pt tolerated well

## 2019-06-27 NOTE — ED Notes (Signed)
Pt given water and updated on plan status for room

## 2019-06-27 NOTE — Progress Notes (Signed)
ANTICOAGULATION CONSULT NOTE - Initial Consult  Pharmacy Consult for Heparin Indication: chest pain/ACS  Allergies  Allergen Reactions  . Penicillins Anaphylaxis    Reported airway compromise    Patient Measurements: Height: 5\' 7"  (170.2 cm) Weight: 78 kg (172 lb) IBW/kg (Calculated) : 66.1 HEPARIN DW (KG): 78  Vital Signs: Temp: 99.4 F (37.4 C) (05/16 0122) Temp Source: Oral (05/16 0122) BP: 184/58 (05/16 0122) Pulse Rate: 85 (05/16 0122)  Labs: Recent Labs    06/27/19 0228  HGB 9.3*  HCT 28.1*  PLT 297  CREATININE 1.59*  TROPONINIHS 38*    Estimated Creatinine Clearance: 30.6 mL/min (A) (by C-G formula based on SCr of 1.59 mg/dL (H)).   Medical History: Past Medical History:  Diagnosis Date  . Allergy   . Arthritis   . Carpal tunnel syndrome   . Chronic kidney disease   . Coronary artery disease   . Diabetes mellitus without complication ([place])   . Diverticulitis   . GERD (gastroesophageal reflux disease)   . Gout   . History of blood transfusion   . History of chicken pox   . Hyperlipidemia   . Hypertension   . Mild dementia ([place])   . Myocardial infarction ([place]) 03/2018  . PAD (peripheral artery disease) ([place])   . Prostate cancer [facility])    prostate  . Prostate cancer ([place])   . Syncope     Medications:  (Not in a hospital admission)   Assessment: Asked to initiate Heparin protocol for ACS.  Baseline labs ordered/pending.  No anticoagulants PTA per med list.   Goal of Therapy:  Heparin level 0.3-0.7 units/ml Monitor platelets by anticoagulation protocol: Yes   Plan:  Heparin 4000 units IV bolus x 1 then infusion at 950 units/hr Check HL ~ 8 hours after heparin started  Hart Robinsons A 06/27/2019,3:19 AM

## 2019-06-27 NOTE — ED Notes (Signed)
Pt given breakfast tray at this time. 
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
## 2019-06-27 NOTE — Discharge Instructions (Signed)
Angina  Angina is very bad discomfort or pain in the chest, neck, arm, jaw, or back. The discomfort is caused by a lack of blood in the middle layer of the heart wall (myocardium). What are the causes? This condition is caused by a buildup of fat and cholesterol (plaque) in your arteries (atherosclerosis). This buildup narrows the arteries and makes it hard for blood to flow. What increases the risk? You are more likely to develop this condition if:  You have high levels of cholesterol in your blood.  You have high blood pressure (hypertension).  You have diabetes.  You have a family history of heart disease.  You are not active, or you do not exercise enough.  You feel sad (depressed).  You have been treated with high energy rays (radiation) on the left side of your chest. Other risk factors are:  Using tobacco.  Being very overweight (obese).  Eating a diet high in unhealthy fats (saturated fats).  Having stress, or being exposed to things that cause stress.  Using drugs, such as cocaine. Women have a greater risk for angina if:  They are older than 55.  They have stopped having their period (are in postmenopause). What are the signs or symptoms? Common symptoms of this condition in both men and women may include:  Chest pain, which may: ? Feel like a crushing or squeezing in the chest. ? Feel like a tightness, pressure, fullness, or heaviness in the chest. ? Last for more than a few minutes at a time. ? Stop and come back (recur) after a few minutes.  Pain in the neck, arm, jaw, or back.  Heartburn or upset stomach (indigestion) for no reason.  Being short of breath.  Feeling sick to your stomach (nauseous).  Sudden cold sweats. Women and people with diabetes may have other symptoms that are not usual, such as feeling:  Tired (fatigue).  Worried or nervous (anxious) for no reason.  Weak for no reason.  Dizzy or passing out (fainting). How is this  treated? This condition may be treated with:  Medicines. These are given to: ? Prevent blood clots. ? Prevent heart attack. ? Relax blood vessels and improve blood flow to the heart (nitrates). ? Reduce blood pressure. ? Improve the pumping action of the heart. ? Reduce fat and cholesterol in the blood.  A procedure to widen a narrowed or blocked artery in the heart (angioplasty).  Surgery to allow blood to go around a blocked artery (coronary artery bypass surgery). Follow these instructions at home: Medicines  Take over-the-counter and prescription medicines only as told by your doctor.  Do not take these medicines unless your doctor says that you can: ? NSAIDs. These include:  Ibuprofen.  Naproxen. ? Vitamin supplements that have vitamin A, vitamin E, or both. ? Hormone therapy that contains estrogen with or without progestin. Eating and drinking   Eat a heart-healthy diet that includes: ? Lots of fresh fruits and vegetables. ? Whole grains. ? Low-fat (lean) protein. ? Low-fat dairy products.  Follow instructions from your doctor about what you cannot eat or drink. Activity  Follow an exercise program that your doctor tells you.  Talk with your doctor about joining a program to help improve the health of your heart (cardiac rehab).  When you feel tired, take a break. Plan breaks if you know you are going to feel tired. Lifestyle   Do not use any products that contain nicotine or tobacco. This includes cigarettes, e-cigarettes, and   chewing tobacco. If you need help quitting, ask your doctor.  If your doctor says you can drink alcohol: ? Limit how much you use to:  0-1 drink a day for women who are not pregnant.  0-2 drinks a day for men. ? Be aware of how much alcohol is in your drink. In the [place] one drink equals:  One 12 oz bottle of beer (355 mL).  One 5 oz glass of wine (148 mL).  One 1 oz glass of hard liquor (44 mL). General instructions  Stay  at a healthy weight. If your doctor tells you to do so, work with him or her to lose weight.  Learn to deal with stress. If you need help, ask your doctor.  Keep your vaccines up to date. Get a flu shot every year.  Talk with your doctor if you feel sad. Take a screening test to see if you are at risk for depression.  Work with your doctor to manage any other health problems that you have. These may include diabetes or high blood pressure.  Keep all follow-up visits as told by your doctor. This is important. Get help right away if:  You have pain in your chest, neck, arm, jaw, or back, and the pain: ? Lasts more than a few minutes. ? Comes back. ? Does not get better after you take medicine under your tongue (sublingual nitroglycerin). ? Keeps getting worse. ? Comes more often.  You have any of these problems for no reason: ? Sweating a lot. ? Heartburn or upset stomach. ? Shortness of breath. ? Trouble breathing. ? Feeling sick to your stomach. ? Throwing up (vomiting). ? Feeling more tired than normal. ? Feeling nervous or worrying more than normal. ? Weakness.  You are suddenly dizzy or light-headed.  You pass out. These symptoms may be an emergency. Do not wait to see if the symptoms will go away. Get medical help right away. Call your local emergency services (911 in the [place] Do not drive yourself to the hospital. Summary  Angina is very bad discomfort or pain in the chest, neck, arm, neck, or back.  Symptoms include chest pain, heartburn or upset stomach for no reason, and shortness of breath.  Women or people with diabetes may have symptoms that are not usual, such as feeling nervous or worried for no reason, weak for no reason, or tired.  Take all medicines only as told by your doctor.  You should eat a heart-healthy diet and follow an exercise program. This information is not intended to replace advice given to you by your health care provider. Make sure you  discuss any questions you have with your health care provider. Document Revised: 09/15/2017 Document Reviewed: 09/15/2017 Elsevier Patient Education  2020 Elsevier Inc.  

## 2019-06-27 NOTE — ED Notes (Signed)
Called Pharmacy and spoke with  Cristie Hem who states he will verify medications and send up any missing ones for pt.
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
## 2019-06-27 NOTE — ED Notes (Signed)
Pt assisted back to bed. Pt placed back on monitor. Pt resting comfortably at this time

## 2019-06-27 NOTE — Discharge Summary (Signed)
[facility] [place] NAME: Peter [name]    MR#:  [id number]  DATE OF BIRTH:  1931-02-08  DATE OF ADMISSION:  06/27/2019 ADMITTING PHYSICIAN: Athena Masse, MD  DATE OF DISCHARGE: 06/27/2019  PRIMARY CARE PHYSICIAN: Lesleigh Noe, MD    ADMISSION DIAGNOSIS:  Unstable angina ([place]) [I20.0]  DISCHARGE DIAGNOSIS:  Principal Problem:   Unstable angina ([place]) Active Problems:   Hypertension   DM (diabetes mellitus) type II controlled with renal manifestation (HCC)   CKD (chronic kidney disease) stage 3, GFR 30-59 ml/min   CAD (coronary artery disease)   SECONDARY DIAGNOSIS:   Past Medical History:  Diagnosis Date  . Allergy   . Arthritis   . Carpal tunnel syndrome   . Chronic kidney disease   . Coronary artery disease   . Diabetes mellitus without complication ([place])   . Diverticulitis   . GERD (gastroesophageal reflux disease)   . Gout   . History of blood transfusion   . History of chicken pox   . Hyperlipidemia   . Hypertension   . Mild dementia ([place])   . Myocardial infarction ([place]) 03/2018  . PAD (peripheral artery disease) ([place])   . Prostate cancer [facility])    prostate  . Prostate cancer ([place])   . Syncope     HOSPITAL COURSE:   1.  Chest pain concerning for angina with borderline troponin. Patient has a history of CAD with recent stents last year to the RCA.  Troponin 38 and 47.  The patient was initially started on heparin drip.  Patient already on aspirin and Plavix and Lipitor.  Beta-blocker relative contraindication for bradycardia.  The patient was very upset that nothing has been done and has been here for 12 hours.  I told him that since it is Sunday we do not have the ability to do stress test or cardiac catheterizations.  This type of testing can be done tomorrow.  The patient did not want to stay in the hospital any further.  Case discussed with Dr. Rockey Situ cardiology who saw the patient.  The patient stated to  Dr. Rockey Situ that he did not want to stay.  Dr. Rockey Situ recommended sublingual nitroglycerin to his regimen, which I E-prescribed into his pharmacy.  He can follow-up with Dr. Saunders Revel cardiology this week to determine further testing. 2. Type 2 diabetes mellitus with chronic kidney disease stage IIIa.  Continue his usual insulin regimen 3.  Essential hypertension continue his usual medication 4.  PAD on aspirin Plavix and statin 5.  Mild dementia continue usual medications 6.  Hyperlipidemia unspecified continue Lipitor.  We will add on a lipid profile. 7.  Anemia unspecified.  This will have to be followed up as outpatient 8.  History of prostate cancer.  Looking back PSA 14.68 in January 2021  DISCHARGE CONDITIONS:   Fair  CONSULTS OBTAINED:  Treatment Team:  Minna Merritts, MD  DRUG ALLERGIES:   Allergies  Allergen Reactions  . Penicillins Anaphylaxis    Reported airway compromise    DISCHARGE MEDICATIONS:   Allergies as of 06/27/2019      Reactions   Penicillins Anaphylaxis   Reported airway compromise      Medication List    STOP taking these medications   ibuprofen 200 MG tablet Commonly known as: ADVIL     TAKE these medications   allopurinol 100 MG tablet Commonly known as: ZYLOPRIM TAKE 1 TABLET BY MOUTH EVERY DAY   amLODipine  10 MG tablet Commonly known as: NORVASC TAKE 1 TABLET BY MOUTH EVERY DAY   aspirin EC 81 MG tablet Take 81 mg by mouth daily.   atorvastatin 40 MG tablet Commonly known as: LIPITOR TAKE 1 TABLET BY MOUTH EVERY DAY   beta carotene 25000 UNIT capsule Take 25,000 Units by mouth daily.   clopidogrel 75 MG tablet Commonly known as: PLAVIX Take 1 tablet (75 mg total) by mouth daily.   donepezil 10 MG tablet Commonly known as: ARICEPT TAKE 1 TABLET BY MOUTH EVERYDAY AT BEDTIME   doxazosin 4 MG tablet Commonly known as: Cardura Take 1 tablet (4 mg total) by mouth at bedtime.   ferrous sulfate 325 (65 FE) MG tablet TAKE 1  TABLET BY MOUTH EVERY DAY   finasteride 5 MG tablet Commonly known as: PROSCAR Take 1 tablet (5 mg total) by mouth daily.   Fish Oil 500 MG Caps Take 500 mg by mouth 2 (two) times a week.   Flax Seed Oil 1000 MG Caps Take 1,000 mg by mouth 2 (two) times a week.   folic acid A999333 MCG tablet Commonly known as: FOLVITE Take 400 mcg by mouth 2 (two) times a week.   gabapentin 300 MG capsule Commonly known as: NEURONTIN Take 1 capsule (300 mg total) by mouth 2 (two) times daily.   insulin glargine 100 UNIT/ML injection Commonly known as: LANTUS Inject 0.2 mLs (20 Units total) into the skin at bedtime.   Insulin Syringe-Needle U-100 31G X 5/16" 1 ML Misc Commonly known as: TRUEplus Insulin Syringe Use daily as directed. Dispense needles as prescribed in the past. DX: E11.22   losartan 100 MG tablet Commonly known as: COZAAR Take 100 mg by mouth daily.   memantine 10 MG tablet Commonly known as: NAMENDA TAKE 1 TABLET BY MOUTH TWICE A DAY   nitroGLYCERIN 0.4 MG SL tablet Commonly known as: NITROSTAT Place 1 tablet (0.4 mg total) under the tongue every 5 (five) minutes x 3 doses as needed for chest pain.   selenium 50 MCG Tabs tablet Take 50 mcg by mouth 2 (two) times a week.   sertraline 25 MG tablet Commonly known as: ZOLOFT TAKE 1 TABLET BY MOUTH EVERY DAY   vitamin C 1000 MG tablet Take 1,000 mg by mouth daily.        DISCHARGE INSTRUCTIONS:   Follow-up with Dr. Saunders Revel this week for appointment and set up testing Follow-up PMD 5 days  If you experience worsening of your admission symptoms, develop shortness of breath, life threatening emergency, suicidal or homicidal thoughts you must seek medical attention immediately by calling 911 or calling your MD immediately  if symptoms less severe.  You Must read complete instructions/literature along with all the possible adverse reactions/side effects for all the Medicines you take and that have been prescribed to you.  Take any new Medicines after you have completely understood and accept all the possible adverse reactions/side effects.   Please note  You were cared for by a hospitalist during your hospital stay. If you have any questions about your discharge medications or the care you received while you were in the hospital after you are discharged, you can call the unit and asked to speak with the hospitalist on call if the hospitalist that took care of you is not available. Once you are discharged, your primary care physician will handle any further medical issues. Please note that NO REFILLS for any discharge medications will be authorized once you are discharged, as  it is imperative that you return to your primary care physician (or establish a relationship with a primary care physician if you do not have one) for your aftercare needs so that they can reassess your need for medications and monitor your lab values.    Today   CHIEF COMPLAINT:   Chief Complaint  Patient presents with  . Chest Pain    HISTORY OF PRESENT ILLNESS:  Peter [name]  is a 86 y.o. male presenting with chest pain   VITAL SIGNS:  Blood pressure (!) 163/63, pulse 62, temperature 99.4 F (37.4 C), temperature source Oral, resp. rate 18, height 5\' 7"  (1.702 m), weight 78 kg, SpO2 95 %.   PHYSICAL EXAMINATION:  GENERAL:  86 y.o.-year-old patient lying in the bed with no acute distress.  EYES: Pupils equal, round, reactive to light and accommodation. No scleral icterus. NECK:  Supple LUNGS: Normal breath sounds bilaterally, no wheezing, rales,rhonchi or crepitation. No use of accessory muscles of respiration.  CARDIOVASCULAR: S1, S2 normal. No murmurs, rubs, or gallops.  ABDOMEN: Soft, non-tender EXTREMITIES: No pedal edema, cyanosis, or clubbing.  NEUROLOGIC: Cranial nerves II through XII are intact. Muscle strength 5/5 in all extremities. Sensation intact. Gait not checked.  PSYCHIATRIC: The patient is alert and  oriented x 3.  SKIN: No obvious rash, lesion, or ulcer.   DATA REVIEW:   CBC Recent Labs  Lab 06/27/19 0228  WBC 11.9*  HGB 9.3*  HCT 28.1*  PLT 297    Chemistries  Recent Labs  Lab 06/27/19 0228  NA 140  K 4.3  CL 109  CO2 25  GLUCOSE 192*  BUN 45*  CREATININE 1.59*  CALCIUM 8.2*  AST 31  ALT 56*  ALKPHOS 101  BILITOT 0.6    Microbiology Results  Results for orders placed or performed during the hospital encounter of 06/27/19  SARS Coronavirus 2 by RT PCR (hospital order, performed in [facility] hospital lab) Nasopharyngeal Nasopharyngeal Swab     Status: None   Collection Time: 06/27/19  3:18 AM   Specimen: Nasopharyngeal Swab  Result Value Ref Range Status   SARS Coronavirus 2 NEGATIVE NEGATIVE Final    Comment: (NOTE) SARS-CoV-2 target nucleic acids are NOT DETECTED. The SARS-CoV-2 RNA is generally detectable in upper and lower respiratory specimens during the acute phase of infection. The lowest concentration of SARS-CoV-2 viral copies this assay can detect is 250 copies / mL. A negative result does not preclude SARS-CoV-2 infection and should not be used as the sole basis for treatment or other patient management decisions.  A negative result may occur with improper specimen collection / handling, submission of specimen other than nasopharyngeal swab, presence of viral mutation(s) within the areas targeted by this assay, and inadequate number of viral copies (<250 copies / mL). A negative result must be combined with clinical observations, patient history, and epidemiological information. Fact Sheet for Patients:   StrictlyIdeas.no Fact Sheet for Healthcare Providers: BankingDealers.co.za This test is not yet approved or cleared  by the [place] FDA and has been authorized for detection and/or diagnosis of SARS-CoV-2 by FDA under an Emergency Use Authorization (EUA).  This EUA will remain in effect  (meaning this test can be used) for the duration of the COVID-19 declaration under Section 564(b)(1) of the Act, 21 U.S.C. section 360bbb-3(b)(1), unless the authorization is terminated or revoked sooner. Performed at [facility], [address], [place], [place] [address]     RADIOLOGY:  [facility] [address]  Result Date: 06/27/2019 CLINICAL DATA:  Chest pressure EXAM: PORTABLE CHEST 1 VIEW COMPARISON:  None. FINDINGS: The heart size and mediastinal contours are within normal limits. Both lungs are clear. The visualized skeletal structures are unremarkable. Recording device over the left lower chest IMPRESSION: No active disease. Electronically Signed   By: Donavan Foil M.D.   On: 06/27/2019 02:13    Management plans discussed with the patient, family.  The patient told me he is going home.  CODE STATUS:     Code Status Orders  (From admission, onward)         Start     Ordered   06/27/19 0504  Full code  Continuous     06/27/19 0506        Code Status History    Date Active Date Inactive Code Status Order ID Comments User Context   12/01/2018 1110 12/01/2018 1604 Full Code [id number]  [name], MD Inpatient   04/18/2014 2134 04/24/2014 1953 Full Code [id number]  Toy Baker, MD Inpatient   Advance Care Planning Activity    Advance Directive Documentation     Most Recent Value  Type of Advance Directive  Healthcare Power of [place], Living will  Pre-existing out of facility DNR order (yellow form or pink MOST form)  --  "MOST" Form in Place?  --      TOTAL TIME TAKING CARE OF THIS PATIENT: 32 minutes.    Loletha Grayer M.D on 06/27/2019 at 12:39 PM  Between 7am to 6pm - Pager - [phone]  After 6pm go to [email or web address] - password [email or web address]  [facility]  CC: Primary care physician; Lesleigh Noe, MD

## 2019-06-27 NOTE — ED Notes (Signed)
Pt up to toilet 

## 2019-06-27 NOTE — Progress Notes (Signed)
*  PRELIMINARY RESULTS* Echocardiogram 2D Echocardiogram has been performed.  Peter [name] Peter [name] 06/27/2019, 11:51 AM

## 2019-06-27 NOTE — ED Triage Notes (Signed)
Pt presents to ER via [facility] with complaints of intermittent chest pressure for past 2 days. Pt denies any other symptom. Pt talks in complete sentences. Per EMS BP 201/72, pt refused IV and Nitro pt preferred to get IV while in the hospital. Pt took 2 baby aspirins 2 hrs ago, EMS administered 2 more in route.
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
## 2019-06-27 NOTE — ED Notes (Signed)
Patient assisted with the urinal

## 2019-06-27 NOTE — ED Notes (Signed)
Pt up to toilet to use urinal

## 2019-06-29 DIAGNOSIS — R2681 Unsteadiness on feet: Secondary | ICD-10-CM | POA: Diagnosis not present
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
## 2019-06-30 ENCOUNTER — Ambulatory Visit (INDEPENDENT_AMBULATORY_CARE_PROVIDER_SITE_OTHER): Payer: Medicare Other | Admitting: Physician Assistant

## 2019-06-30 ENCOUNTER — Other Ambulatory Visit: Payer: Self-pay

## 2019-06-30 ENCOUNTER — Ambulatory Visit (INDEPENDENT_AMBULATORY_CARE_PROVIDER_SITE_OTHER): Payer: Medicare Other | Admitting: Internal Medicine

## 2019-06-30 ENCOUNTER — Other Ambulatory Visit
Admission: RE | Admit: 2019-06-30 | Discharge: 2019-06-30 | Disposition: A | Discharge status: Home / Self Care | Payer: Medicare Other | Source: Ambulatory Visit | Attending: Internal Medicine | Admitting: Internal Medicine

## 2019-06-30 ENCOUNTER — Encounter: Payer: Self-pay | Admitting: Urology

## 2019-06-30 ENCOUNTER — Encounter: Payer: Self-pay | Admitting: Internal Medicine

## 2019-06-30 VITALS — BP 170/60 | HR 65 | Ht 67.0 in | Wt 178.1 lb

## 2019-06-30 VITALS — BP 176/73 | HR 63

## 2019-06-30 DIAGNOSIS — R339 Retention of urine, unspecified: Secondary | ICD-10-CM | POA: Diagnosis not present

## 2019-06-30 DIAGNOSIS — Z01810 Encounter for preprocedural cardiovascular examination: Secondary | ICD-10-CM | POA: Insufficient documentation

## 2019-06-30 DIAGNOSIS — I200 Unstable angina: Secondary | ICD-10-CM

## 2019-06-30 DIAGNOSIS — I10 Essential (primary) hypertension: Secondary | ICD-10-CM | POA: Diagnosis not present

## 2019-06-30 DIAGNOSIS — E785 Hyperlipidemia, unspecified: Secondary | ICD-10-CM | POA: Diagnosis not present

## 2019-06-30 DIAGNOSIS — R3915 Urgency of urination: Secondary | ICD-10-CM

## 2019-06-30 DIAGNOSIS — I739 Peripheral vascular disease, unspecified: Secondary | ICD-10-CM

## 2019-06-30 DIAGNOSIS — N401 Enlarged prostate with lower urinary tract symptoms: Secondary | ICD-10-CM | POA: Diagnosis not present

## 2019-06-30 DIAGNOSIS — N1832 Chronic kidney disease, stage 3b: Secondary | ICD-10-CM | POA: Diagnosis not present

## 2019-06-30 LAB — PROTIME-INR
INR: 1 (ref 0.8–1.2)
Prothrombin Time: 12.7 seconds (ref 11.4–15.2)

## 2019-06-30 LAB — CBC WITH DIFFERENTIAL/PLATELET
Abs Immature Granulocytes: 0.07 10*3/uL (ref 0.00–0.07)
Basophils Absolute: 0 10*3/uL (ref 0.0–0.1)
Basophils Relative: 0 %
Eosinophils Absolute: 0.1 10*3/uL (ref 0.0–0.5)
Eosinophils Relative: 1 %
HCT: 29.5 % — ABNORMAL LOW (ref 39.0–52.0)
Hemoglobin: 10 g/dL — ABNORMAL LOW (ref 13.0–17.0)
Immature Granulocytes: 1 %
Lymphocytes Relative: 14 %
Lymphs Abs: 1.6 10*3/uL (ref 0.7–4.0)
MCH: 28.8 pg (ref 26.0–34.0)
MCHC: 33.9 g/dL (ref 30.0–36.0)
MCV: 85 fL (ref 80.0–100.0)
Monocytes Absolute: 0.8 10*3/uL (ref 0.1–1.0)
Monocytes Relative: 7 %
Neutro Abs: 8.8 10*3/uL — ABNORMAL HIGH (ref 1.7–7.7)
Neutrophils Relative %: 77 %
Platelets: 305 10*3/uL (ref 150–400)
RBC: 3.47 MIL/uL — ABNORMAL LOW (ref 4.22–5.81)
RDW: 14 % (ref 11.5–15.5)
WBC: 11.5 10*3/uL — ABNORMAL HIGH (ref 4.0–10.5)
nRBC: 0 % (ref 0.0–0.2)

## 2019-06-30 LAB — BASIC METABOLIC PANEL
Anion gap: 11 (ref 5–15)
BUN: 36 mg/dL — ABNORMAL HIGH (ref 8–23)
CO2: 21 mmol/L — ABNORMAL LOW (ref 22–32)
Calcium: 8.4 mg/dL — ABNORMAL LOW (ref 8.9–10.3)
Chloride: 107 mmol/L (ref 98–111)
Creatinine, Ser: 1.4 mg/dL — ABNORMAL HIGH (ref 0.61–1.24)
GFR calc Af Amer: 52 mL/min — ABNORMAL LOW (ref >60)
GFR calc non Af Amer: 45 mL/min — ABNORMAL LOW (ref >60)
Glucose, Bld: 131 mg/dL — ABNORMAL HIGH (ref 70–99)
Potassium: 4.4 mmol/L (ref 3.5–5.1)
Sodium: 139 mmol/L (ref 135–145)

## 2019-06-30 LAB — BLADDER SCAN AMB NON-IMAGING: Scan Result: 88

## 2019-06-30 MED ORDER — ISOSORBIDE MONONITRATE ER 30 MG PO TB24
30.0000 mg | ORAL_TABLET | Freq: Every day | ORAL | 3 refills | Status: DC
Start: 2019-06-30 — End: 2019-07-07

## 2019-06-30 NOTE — H&P (View-Only) (Signed)
Follow-up Outpatient Visit Date: 06/30/2019  Primary Care Provider: Lesleigh Noe, [place] [id number]  Chief Complaint: Chest pain  HPI:  [name] is a 86 y.o. male with history of coronary artery disease status post PCI to the RCA(03/2018), peripheral vascular disease, hypertension, hyperlipidemia, type 2 diabetes mellitus, and prostate cancer, who presents for follow-up of chest pain with recent ED visit.  Mr Head presented to the [facility] emergency department 3 days ago with 2 episodes of crushing substernal chest pain.  Pain occurred at rest and did not resolve after taking aspirin the second time.  He therefore summoned EMS and was given NTG with prompt resolution of his chest pain.  ED evaluation was notable for mildly elevated high-sensitivity troponin I (38->47).  [name] declined to stay in the hospital, as cardiac imaging could not be performed until Monday.  Today, Peter [name] reports that he has experienced a few episodes of questionable chest discomfort since leaving the hospital.  He has taken SL NTG once with prompt resolution of the discomfort.  There are no associated symptoms.  He has chronic abdominal pain, which is no different than his baseline.  He has not had any shortness of breath, palpitations, lightheadedness, edema, or bleeding.  He remains compliant with his medications, including DAPT with ASA and clopidogrel.  Home BP's have been quite elevated recently, with systolic readings up to A999333 mmHg.  --------------------------------------------------------------------------------------------------  Cardiovascular History & Procedures: Cardiovascular Problems:  Coronary artery disease status post PCI to the RCA  PAD status post multiple endovascular interventions and amputations of right toes.  Risk Factors:  Known CAD and PAD, hypertension, hyperlipidemia, diabetes mellitus, male gender, and age greater than 69  Cath/PCI:  LHC  (03/27/2018, [place]): LMCA normal. LAD with 60% proximal and 30% mid vessel stenoses. LCx with 40% distal stenosis. RCA with sequential 95% proximal, 70% mid, and 40% distal stenoses. PCI to proximal and mid RCA using Xience Sierra 2.5 x 38 mm and Xience Sierra 2.5 x 23 mm drug-eluting stents.  CV Surgery:  None available  EP Procedures and Devices:  None  Non-Invasive Evaluation(s):  TTE (04/22/2018, [place]): Normal LV size. LVEF 50-55% with grade 1 diastolic dysfunction. Normal RV size and function. No significant valvular abnormality.  Recent CV Pertinent Labs: Lab Results  Component Value Date   CHOL 135 06/27/2019   HDL 61 06/27/2019   LDLCALC 62 06/27/2019   TRIG 62 06/27/2019   CHOLHDL 2.2 06/27/2019   INR 1.0 06/30/2019   K 4.4 06/30/2019   MG 2.4 04/21/2014   BUN 36 (H) 06/30/2019   CREATININE 1.40 (H) 06/30/2019    Past medical and surgical history were reviewed and updated in EPIC.  Current Meds  Medication Sig  . allopurinol (ZYLOPRIM) 100 MG tablet TAKE 1 TABLET BY MOUTH EVERY DAY  . amLODipine (NORVASC) 10 MG tablet TAKE 1 TABLET BY MOUTH EVERY DAY  . Ascorbic Acid (VITAMIN C) 1000 MG tablet Take 1,000 mg by mouth daily.  [name] aspirin EC 81 MG tablet Take 81 mg by mouth daily.  [name] atorvastatin (LIPITOR) 40 MG tablet TAKE 1 TABLET BY MOUTH EVERY DAY  . beta carotene 25000 UNIT capsule Take 25,000 Units by mouth daily.  . clopidogrel (PLAVIX) 75 MG tablet Take 1 tablet (75 mg total) by mouth daily.  [name] donepezil (ARICEPT) 10 MG tablet TAKE 1 TABLET BY MOUTH EVERYDAY AT BEDTIME  . doxazosin (CARDURA) 4 MG tablet Take 1 tablet (4  mg total) by mouth at bedtime.  . ferrous sulfate 325 (65 FE) MG tablet TAKE 1 TABLET BY MOUTH EVERY DAY  . finasteride (PROSCAR) 5 MG tablet Take 1 tablet (5 mg total) by mouth daily.  . Flaxseed, Linseed, (FLAX SEED OIL) 1000 MG CAPS Take 1,000 mg by mouth 2 (two) times a week.   . folic acid (FOLVITE) A999333 MCG tablet Take 400 mcg by mouth  2 (two) times a week.   . gabapentin (NEURONTIN) 300 MG capsule Take 1 capsule (300 mg total) by mouth 2 (two) times daily.  . insulin glargine (LANTUS) 100 UNIT/ML injection Inject 0.2 mLs (20 Units total) into the skin at bedtime.  . Insulin Syringe-Needle U-100 (TRUEPLUS INSULIN SYRINGE) 31G X 5/16" 1 ML MISC Use daily as directed. Dispense needles as prescribed in the past. DX: E11.22  . losartan (COZAAR) 100 MG tablet Take 100 mg by mouth daily.  . memantine (NAMENDA) 10 MG tablet TAKE 1 TABLET BY MOUTH TWICE A DAY  . nitroGLYCERIN (NITROSTAT) 0.4 MG SL tablet Place 1 tablet (0.4 mg total) under the tongue every 5 (five) minutes x 3 doses as needed for chest pain.  . Omega-3 Fatty Acids (FISH OIL) 500 MG CAPS Take 500 mg by mouth 2 (two) times a week.   . selenium 50 MCG TABS tablet Take 50 mcg by mouth 2 (two) times a week.   . sertraline (ZOLOFT) 25 MG tablet TAKE 1 TABLET BY MOUTH EVERY DAY    Allergies: Penicillins  Social History   Tobacco Use  . Smoking status: Former Smoker    Packs/day: 0.75    Years: 12.00    Pack years: 9.00    Types: Cigars, Cigarettes    Quit date: 1970    Years since quitting: 51.4  . Smokeless tobacco: Former Systems developer    Types: [place] date: 2016  Substance Use Topics  . Alcohol use: Yes    Alcohol/week: 2.0 standard drinks    Types: 2 Standard drinks or equivalent per week  . Drug use: No    Family History  Problem Relation Age of Onset  . Diabetes Mother   . Hypertension Mother   . Diabetes Father   . Dementia Father   . Healthy Daughter   . Healthy Daughter     Review of Systems: A 12-system review of systems was performed and was negative except as noted in the HPI.  --------------------------------------------------------------------------------------------------  Physical Exam: BP (!) 170/60 (BP Location: Right Arm, Patient Position: Sitting, Cuff Size: Normal)   Pulse 65   Ht 5\' 7"  (1.702 m)   Wt 178 lb 2 oz (80.8 kg)    SpO2 98%   BMI 27.90 kg/m   General:  NAD.  Accompanied by his wife. HEENT: No conjunctival pallor or scleral icterus. Facemask in place. Neck: No JVD or HJR. Lungs: Normal work of breathing. Clear to auscultation bilaterally without wheezes or crackles. Heart: Regular rate and rhythm without murmurs, rubs, or gallops. Non-displaced PMI. Abd: Bowel sounds present. Soft, NT/ND. Ext: No lower extremity edema. 2+ radial pulses. EKG:  NSR with borderline LVH and lateral T-wave inversions.  No significant change from prior tracings.  Lab Results  Component Value Date   WBC 11.5 (H) 06/30/2019   HGB 10.0 (L) 06/30/2019   HCT 29.5 (L) 06/30/2019   MCV 85.0 06/30/2019   PLT 305 06/30/2019    Lab Results  Component Value Date   NA 139 06/30/2019   K  4.4 06/30/2019   CL 107 06/30/2019   CO2 21 (L) 06/30/2019   BUN 36 (H) 06/30/2019   CREATININE 1.40 (H) 06/30/2019   GLUCOSE 131 (H) 06/30/2019   ALT 56 (H) 06/27/2019    Lab Results  Component Value Date   CHOL 135 06/27/2019   HDL 61 06/27/2019   LDLCALC 62 06/27/2019   TRIG 62 06/27/2019   CHOLHDL 2.2 06/27/2019    --------------------------------------------------------------------------------------------------  ASSESSMENT AND PLAN: Unstable angina: Recent CP with borderline elevated HS-TnI during recent hospital visit is concerning for ACS.  Mr. Gloss was unwilling to remain admitted over the weekend for further cardiac testing.  Echo at that time did not show any regional wall motion abnormality.  It is possible that his recently uncontrolled HTN may be contributing to an element of demand ischemia as well.  I have recommended that we proceed with left heart catheterization with possible PCI as soon as possible; this has been arranged for Friday (2 days from now).  I have reviewed the risks, indications, and alternatives to cardiac catheterization, possible angioplasty, and stenting with the patient. Risks include but are  not limited to bleeding, infection, vascular injury, stroke, myocardial infection, arrhythmia, kidney injury, radiation-related injury in the case of prolonged fluoroscopy use, emergency cardiac surgery, and death. The patient understands the risks of serious complication is 1-2 in [id number] with diagnostic cardiac cath and 1-2% or less with angioplasty/stenting.  Due to his anemia and moderate renal insufficiency (chronic), I will repeat a CBC and BMP to ensure that his hemoglobin and creatinine are not worsening and contributing to his symptoms.  I will add isosorbide mononitrate 30 mg daily for BP and antianginal therapy in addition to his other medications.  I advised Mr. Faver to call [phone] should he have recurrent chest pain that does not resolve promptly with SL NTG.  PAD: No active symptoms.  Continue secondary prevention and defer further evaluation until after aforementioned catheterization.  Hypertension: Poorly controlled.  We will add isosorbide mononitrate 30 mg daily.  No changes to current doses of amlodipine, doxazosin, and losartan.  Hyperlipidemia: Continue atorvastatin 40 mg daily.  Chronic kidney disease stage 3b: Relatively stable on most recent labs.  We will recheck a BMP prior to catheterization on Friday.  Increased risk for CIN discussed with the patient.  Follow-up: Return to clinic ~2 weeks after catheterization.  [name], MD 07/01/2019 5:58 AM

## 2019-06-30 NOTE — Progress Notes (Signed)
Follow-up Outpatient Visit Date: 06/30/2019  Primary Care Provider: Lesleigh Noe, [place] [id number]  Chief Complaint: Chest pain  HPI:  Mr. Brinkworth is a 86 y.o. male with history of coronary artery disease status post PCI to the RCA(03/2018), peripheral vascular disease, hypertension, hyperlipidemia, type 2 diabetes mellitus, and prostate cancer, who presents for follow-up of chest pain with recent ED visit.  Mr Beattie presented to the [facility] emergency department 3 days ago with 2 episodes of crushing substernal chest pain.  Pain occurred at rest and did not resolve after taking aspirin the second time.  He therefore summoned EMS and was given NTG with prompt resolution of his chest pain.  ED evaluation was notable for mildly elevated high-sensitivity troponin I (38->47).  Mr. Piech declined to stay in the hospital, as cardiac imaging could not be performed until Monday.  Today, Mr. Homeier reports that he has experienced a few episodes of questionable chest discomfort since leaving the hospital.  He has taken SL NTG once with prompt resolution of the discomfort.  There are no associated symptoms.  He has chronic abdominal pain, which is no different than his baseline.  He has not had any shortness of breath, palpitations, lightheadedness, edema, or bleeding.  He remains compliant with his medications, including DAPT with ASA and clopidogrel.  Home BP's have been quite elevated recently, with systolic readings up to A999333 mmHg.  --------------------------------------------------------------------------------------------------  Cardiovascular History & Procedures: Cardiovascular Problems:  Coronary artery disease status post PCI to the RCA  PAD status post multiple endovascular interventions and amputations of right toes.  Risk Factors:  Known CAD and PAD, hypertension, hyperlipidemia, diabetes mellitus, male gender, and age greater than 2  Cath/PCI:  LHC  (03/27/2018, [place]): LMCA normal. LAD with 60% proximal and 30% mid vessel stenoses. LCx with 40% distal stenosis. RCA with sequential 95% proximal, 70% mid, and 40% distal stenoses. PCI to proximal and mid RCA using Xience Sierra 2.5 x 38 mm and Xience Sierra 2.5 x 23 mm drug-eluting stents.  CV Surgery:  None available  EP Procedures and Devices:  None  Non-Invasive Evaluation(s):  TTE (04/22/2018, [place]): Normal LV size. LVEF 50-55% with grade 1 diastolic dysfunction. Normal RV size and function. No significant valvular abnormality.  Recent CV Pertinent Labs: Lab Results  Component Value Date   CHOL 135 06/27/2019   HDL 61 06/27/2019   LDLCALC 62 06/27/2019   TRIG 62 06/27/2019   CHOLHDL 2.2 06/27/2019   INR 1.0 06/30/2019   K 4.4 06/30/2019   MG 2.4 04/21/2014   BUN 36 (H) 06/30/2019   CREATININE 1.40 (H) 06/30/2019    Past medical and surgical history were reviewed and updated in EPIC.  Current Meds  Medication Sig  . allopurinol (ZYLOPRIM) 100 MG tablet TAKE 1 TABLET BY MOUTH EVERY DAY  . amLODipine (NORVASC) 10 MG tablet TAKE 1 TABLET BY MOUTH EVERY DAY  . Ascorbic Acid (VITAMIN C) 1000 MG tablet Take 1,000 mg by mouth daily.  [name] aspirin EC 81 MG tablet Take 81 mg by mouth daily.  [name] atorvastatin (LIPITOR) 40 MG tablet TAKE 1 TABLET BY MOUTH EVERY DAY  . beta carotene 25000 UNIT capsule Take 25,000 Units by mouth daily.  . clopidogrel (PLAVIX) 75 MG tablet Take 1 tablet (75 mg total) by mouth daily.  [name] donepezil (ARICEPT) 10 MG tablet TAKE 1 TABLET BY MOUTH EVERYDAY AT BEDTIME  . doxazosin (CARDURA) 4 MG tablet Take 1 tablet (4  mg total) by mouth at bedtime.  . ferrous sulfate 325 (65 FE) MG tablet TAKE 1 TABLET BY MOUTH EVERY DAY  . finasteride (PROSCAR) 5 MG tablet Take 1 tablet (5 mg total) by mouth daily.  . Flaxseed, Linseed, (FLAX SEED OIL) 1000 MG CAPS Take 1,000 mg by mouth 2 (two) times a week.   . folic acid (FOLVITE) A999333 MCG tablet Take 400 mcg by mouth  2 (two) times a week.   . gabapentin (NEURONTIN) 300 MG capsule Take 1 capsule (300 mg total) by mouth 2 (two) times daily.  . insulin glargine (LANTUS) 100 UNIT/ML injection Inject 0.2 mLs (20 Units total) into the skin at bedtime.  . Insulin Syringe-Needle U-100 (TRUEPLUS INSULIN SYRINGE) 31G X 5/16" 1 ML MISC Use daily as directed. Dispense needles as prescribed in the past. DX: E11.22  . losartan (COZAAR) 100 MG tablet Take 100 mg by mouth daily.  . memantine (NAMENDA) 10 MG tablet TAKE 1 TABLET BY MOUTH TWICE A DAY  . nitroGLYCERIN (NITROSTAT) 0.4 MG SL tablet Place 1 tablet (0.4 mg total) under the tongue every 5 (five) minutes x 3 doses as needed for chest pain.  . Omega-3 Fatty Acids (FISH OIL) 500 MG CAPS Take 500 mg by mouth 2 (two) times a week.   . selenium 50 MCG TABS tablet Take 50 mcg by mouth 2 (two) times a week.   . sertraline (ZOLOFT) 25 MG tablet TAKE 1 TABLET BY MOUTH EVERY DAY    Allergies: Penicillins  Social History   Tobacco Use  . Smoking status: Former Smoker    Packs/day: 0.75    Years: 12.00    Pack years: 9.00    Types: Cigars, Cigarettes    Quit date: 1970    Years since quitting: 51.4  . Smokeless tobacco: Former Systems developer    Types: [place] date: 2016  Substance Use Topics  . Alcohol use: Yes    Alcohol/week: 2.0 standard drinks    Types: 2 Standard drinks or equivalent per week  . Drug use: No    Family History  Problem Relation Age of Onset  . Diabetes Mother   . Hypertension Mother   . Diabetes Father   . Dementia Father   . Healthy Daughter   . Healthy Daughter     Review of Systems: A 12-system review of systems was performed and was negative except as noted in the HPI.  --------------------------------------------------------------------------------------------------  Physical Exam: BP (!) 170/60 (BP Location: Right Arm, Patient Position: Sitting, Cuff Size: Normal)   Pulse 65   Ht 5\' 7"  (1.702 m)   Wt 178 lb 2 oz (80.8 kg)    SpO2 98%   BMI 27.90 kg/m   General:  NAD.  Accompanied by his wife. HEENT: No conjunctival pallor or scleral icterus. Facemask in place. Neck: No JVD or HJR. Lungs: Normal work of breathing. Clear to auscultation bilaterally without wheezes or crackles. Heart: Regular rate and rhythm without murmurs, rubs, or gallops. Non-displaced PMI. Abd: Bowel sounds present. Soft, NT/ND. Ext: No lower extremity edema. 2+ radial pulses. EKG:  NSR with borderline LVH and lateral T-wave inversions.  No significant change from prior tracings.  Lab Results  Component Value Date   WBC 11.5 (H) 06/30/2019   HGB 10.0 (L) 06/30/2019   HCT 29.5 (L) 06/30/2019   MCV 85.0 06/30/2019   PLT 305 06/30/2019    Lab Results  Component Value Date   NA 139 06/30/2019   K  4.4 06/30/2019   CL 107 06/30/2019   CO2 21 (L) 06/30/2019   BUN 36 (H) 06/30/2019   CREATININE 1.40 (H) 06/30/2019   GLUCOSE 131 (H) 06/30/2019   ALT 56 (H) 06/27/2019    Lab Results  Component Value Date   CHOL 135 06/27/2019   HDL 61 06/27/2019   LDLCALC 62 06/27/2019   TRIG 62 06/27/2019   CHOLHDL 2.2 06/27/2019    --------------------------------------------------------------------------------------------------  ASSESSMENT AND PLAN: Unstable angina: Recent CP with borderline elevated HS-TnI during recent hospital visit is concerning for ACS.  Mr. Crumbliss was unwilling to remain admitted over the weekend for further cardiac testing.  Echo at that time did not show any regional wall motion abnormality.  It is possible that his recently uncontrolled HTN may be contributing to an element of demand ischemia as well.  I have recommended that we proceed with left heart catheterization with possible PCI as soon as possible; this has been arranged for Friday (2 days from now).  I have reviewed the risks, indications, and alternatives to cardiac catheterization, possible angioplasty, and stenting with the patient. Risks include but are  not limited to bleeding, infection, vascular injury, stroke, myocardial infection, arrhythmia, kidney injury, radiation-related injury in the case of prolonged fluoroscopy use, emergency cardiac surgery, and death. The patient understands the risks of serious complication is 1-2 in [id number] with diagnostic cardiac cath and 1-2% or less with angioplasty/stenting.  Due to his anemia and moderate renal insufficiency (chronic), I will repeat a CBC and BMP to ensure that his hemoglobin and creatinine are not worsening and contributing to his symptoms.  I will add isosorbide mononitrate 30 mg daily for BP and antianginal therapy in addition to his other medications.  I advised Mr. Fawcett to call 911 should he have recurrent chest pain that does not resolve promptly with SL NTG.  PAD: No active symptoms.  Continue secondary prevention and defer further evaluation until after aforementioned catheterization.  Hypertension: Poorly controlled.  We will add isosorbide mononitrate 30 mg daily.  No changes to current doses of amlodipine, doxazosin, and losartan.  Hyperlipidemia: Continue atorvastatin 40 mg daily.  Chronic kidney disease stage 3b: Relatively stable on most recent labs.  We will recheck a BMP prior to catheterization on Friday.  Increased risk for CIN discussed with the patient.  Follow-up: Return to clinic ~2 weeks after catheterization.  [name], MD 07/01/2019 5:58 AM

## 2019-06-30 NOTE — Patient Instructions (Signed)
Medication Instructions:  Your physician has recommended you make the following change in your medication:  1- START Imdur 30 mg by mouth once a day.  *If you need a refill on your cardiac medications before your next appointment, please call your pharmacy*   Lab Work: 1- Your physician recommends that you return for lab work in: TODAY or Tomorrow morning for CBC, BMET, INR. - Please go to the [facility]. You will check in at the front desk to the right as you walk into the atrium. Valet Parking is offered if needed. - No appointment needed. You may go any day between 7 am and 6 pm.   2- COVID PRE- TEST: You will need a COVID TEST prior to the procedure:  LOCATION: [facility] [address] site.  DATE/TIME:  Tomorrow, Jul 01, 2019 between 8 am and 1 pm.  If you have labs (blood work) drawn today and your tests are completely normal, you will receive your results only by: [name] MyChart Message (if you have MyChart) OR . A paper copy in the mail If you have any lab test that is abnormal or we need to change your treatment, we will call you to review the results.   Testing/Procedures:   You are scheduled for a LEFT Cardiac Catheterization on Friday, May 21 with Dr. Harrell Gave End.  1. Please arrive at the [facility] at 10:30 AM (This time is one hour before your procedure to ensure your preparation). Free valet parking service is available.   Special note: Every effort is made to have your procedure done on time. Please understand that emergencies sometimes delay scheduled procedures.  2. Diet: Do not eat solid foods after midnight.  The patient may have clear liquids until 5am upon the day of the procedure.  3. Labs: You will need to have blood drawn on TODAY or TOMORROW at the [facility].  You do not need to be fasting.  4. Medication instructions in preparation for your procedure:   Contrast Allergy: No   Current Outpatient Medications  (Endocrine & Metabolic):  .  insulin glargine (LANTUS) 100 UNIT/ML injection, Inject 0.2 mLs (20 Units total) into the skin at bedtime.  Current Outpatient Medications (Cardiovascular):  .  amLODipine (NORVASC) 10 MG tablet, TAKE 1 TABLET BY MOUTH EVERY DAY .  atorvastatin (LIPITOR) 40 MG tablet, TAKE 1 TABLET BY MOUTH EVERY DAY .  doxazosin (CARDURA) 4 MG tablet, Take 1 tablet (4 mg total) by mouth at bedtime. [name]  losartan (COZAAR) 100 MG tablet, Take 100 mg by mouth daily. .  nitroGLYCERIN (NITROSTAT) 0.4 MG SL tablet, Place 1 tablet (0.4 mg total) under the tongue every 5 (five) minutes x 3 doses as needed for chest pain.   Current Outpatient Medications (Analgesics):  .  allopurinol (ZYLOPRIM) 100 MG tablet, TAKE 1 TABLET BY MOUTH EVERY DAY .  aspirin EC 81 MG tablet, Take 81 mg by mouth daily.  Current Outpatient Medications (Hematological):  .  clopidogrel (PLAVIX) 75 MG tablet, Take 1 tablet (75 mg total) by mouth daily. .  ferrous sulfate 325 (65 FE) MG tablet, TAKE 1 TABLET BY MOUTH EVERY DAY .  folic acid (FOLVITE) A999333 MCG tablet, Take 400 mcg by mouth 2 (two) times a week.   Current Outpatient Medications (Other):  [name]  Ascorbic Acid (VITAMIN C) 1000 MG tablet, Take 1,000 mg by mouth daily. .  beta carotene 25000 UNIT capsule, Take 25,000 Units by mouth daily. [name]  donepezil (ARICEPT) 10 MG tablet, TAKE 1 TABLET BY MOUTH EVERYDAY AT BEDTIME .  finasteride (PROSCAR) 5 MG tablet, Take 1 tablet (5 mg total) by mouth daily. .  Flaxseed, Linseed, (FLAX SEED OIL) 1000 MG CAPS, Take 1,000 mg by mouth 2 (two) times a week.  .  gabapentin (NEURONTIN) 300 MG capsule, Take 1 capsule (300 mg total) by mouth 2 (two) times daily. .  Insulin Syringe-Needle U-100 (TRUEPLUS INSULIN SYRINGE) 31G X 5/16" 1 ML MISC, Use daily as directed. Dispense needles as prescribed in the past. DX: E11.22 .  memantine (NAMENDA) 10 MG tablet, TAKE 1 TABLET BY MOUTH TWICE A DAY .  Omega-3 Fatty Acids (FISH OIL) 500  MG CAPS, Take 500 mg by mouth 2 (two) times a week.  .  selenium 50 MCG TABS tablet, Take 50 mcg by mouth 2 (two) times a week.  .  sertraline (ZOLOFT) 25 MG tablet, TAKE 1 TABLET BY MOUTH EVERY DAY *For reference purposes while preparing patient instructions.   Delete this med list prior to printing instructions for patient.*  Take only 10 units of insulin the night before your procedure. Do not take any insulin on the day of the procedure.   On the morning of your procedure, take your Plavix/Clopidogrel and any morning medicines NOT listed above.  You may use sips of water.  5. Plan for one night stay--bring personal belongings. 6. Bring a current list of your medications and current insurance cards. 7. You MUST have a responsible person to drive you home. 8. Someone MUST be with you the first 24 hours after you arrive home or your discharge will be delayed. 9. Please wear clothes that are easy to get on and off and wear slip-on shoes.  Thank you for allowing [name] to care for you!   -- [facility]   Follow-Up: At [facility], you and your health needs are our priority.  As part of our continuing mission to provide you with exceptional heart care, we have created designated Provider Care Teams.  These Care Teams include your primary Cardiologist (physician) and Advanced Practice Providers (APPs -  Physician Assistants and Nurse Practitioners) who all work together to provide you with the care you need, when you need it.  We recommend signing up for the patient portal called "MyChart".  Sign up information is provided on this After Visit Summary.  MyChart is used to connect with patients for Virtual Visits (Telemedicine).  Patients are able to view lab/test results, encounter notes, upcoming appointments, etc.  Non-urgent messages can be sent to your provider as well.   To learn more about what you can do with MyChart, go to [email or web address].     Your next appointment:   3 week(s)  The format for your next appointment:   In Person  Provider:    You may see [name], MD or one of the following Advanced Practice Providers on your designated Care Team:    Murray Hodgkins, NP  Christell Faith, PA-C  Marrianne Mood, PA-C

## 2019-07-01 ENCOUNTER — Telehealth: Payer: Self-pay | Admitting: Internal Medicine

## 2019-07-01 ENCOUNTER — Other Ambulatory Visit
Admission: RE | Admit: 2019-07-01 | Discharge: 2019-07-01 | Disposition: A | Discharge status: Home / Self Care | Payer: Medicare Other | Source: Ambulatory Visit | Attending: Internal Medicine | Admitting: Internal Medicine

## 2019-07-01 ENCOUNTER — Encounter: Payer: Self-pay | Admitting: Internal Medicine

## 2019-07-01 DIAGNOSIS — R2681 Unsteadiness on feet: Secondary | ICD-10-CM | POA: Diagnosis not present

## 2019-07-01 LAB — SARS CORONAVIRUS 2 (TAT 6-24 HRS): SARS Coronavirus 2: NEGATIVE

## 2019-07-01 NOTE — Telephone Encounter (Signed)
Spoke with patient's wife. She had questions about how long the procedure will be and how long patient will be in recovery prior to discharge. We discussed that procedures do not always start on time and may last longer than originally allotted. Advised she should know about what time patient will be discharged or staying the night when the procedure is over and after Dr End gives her an update.  Wife has Huntington's disease and has some difficulties with ambulation. She can get up and walk around on her own in small distances. Encouraged patient that could not guarantee someone would be able to help her in the waiting area. She is aware of this and will make her decision accordingly as whether to come or stay home. She thinks she can be in the waiting area for 3 hours and be fine.  Her children will be who is picking her and the patient up but cannot be there during the procedure. She verbalized understanding again that staff may not be available for her during her waiting time. She was appreciative.

## 2019-07-01 NOTE — Telephone Encounter (Signed)
Patient wife would like to discuss patient's cath tomorrow. Please call to discuss.

## 2019-07-02 ENCOUNTER — Inpatient Hospital Stay (HOSPITAL_COMMUNITY)
Admission: AD | Admit: 2019-07-02 | Discharge: 2019-07-07 | DRG: 247 | Disposition: A | Discharge status: Home / Self Care | Payer: Medicare Other | Source: Other Acute Inpatient Hospital | Attending: Cardiology | Admitting: Cardiology

## 2019-07-02 ENCOUNTER — Inpatient Hospital Stay
Admission: RE | Admit: 2019-07-02 | Discharge: 2019-07-02 | DRG: 287 | Disposition: A | Discharge status: Other Acute Inpatient Hospital | Payer: Medicare Other | Source: Ambulatory Visit | Attending: Internal Medicine | Admitting: Internal Medicine

## 2019-07-02 ENCOUNTER — Encounter
Admission: RE | Disposition: A | Discharge status: Other Acute Inpatient Hospital | Payer: Self-pay | Source: Ambulatory Visit | Attending: Internal Medicine

## 2019-07-02 ENCOUNTER — Other Ambulatory Visit: Payer: Self-pay

## 2019-07-02 ENCOUNTER — Encounter: Payer: Self-pay | Admitting: Internal Medicine

## 2019-07-02 DIAGNOSIS — F0390 Unspecified dementia without behavioral disturbance: Secondary | ICD-10-CM | POA: Diagnosis present

## 2019-07-02 DIAGNOSIS — I129 Hypertensive chronic kidney disease with stage 1 through stage 4 chronic kidney disease, or unspecified chronic kidney disease: Secondary | ICD-10-CM | POA: Diagnosis present

## 2019-07-02 DIAGNOSIS — D649 Anemia, unspecified: Secondary | ICD-10-CM | POA: Diagnosis present

## 2019-07-02 DIAGNOSIS — E1122 Type 2 diabetes mellitus with diabetic chronic kidney disease: Secondary | ICD-10-CM

## 2019-07-02 DIAGNOSIS — E611 Iron deficiency: Secondary | ICD-10-CM | POA: Diagnosis present

## 2019-07-02 DIAGNOSIS — I10 Essential (primary) hypertension: Secondary | ICD-10-CM

## 2019-07-02 DIAGNOSIS — G8929 Other chronic pain: Secondary | ICD-10-CM | POA: Diagnosis present

## 2019-07-02 DIAGNOSIS — D638 Anemia in other chronic diseases classified elsewhere: Secondary | ICD-10-CM | POA: Diagnosis not present

## 2019-07-02 DIAGNOSIS — Z79899 Other long term (current) drug therapy: Secondary | ICD-10-CM

## 2019-07-02 DIAGNOSIS — N183 Chronic kidney disease, stage 3 unspecified: Secondary | ICD-10-CM

## 2019-07-02 DIAGNOSIS — F329 Major depressive disorder, single episode, unspecified: Secondary | ICD-10-CM | POA: Diagnosis present

## 2019-07-02 DIAGNOSIS — K219 Gastro-esophageal reflux disease without esophagitis: Secondary | ICD-10-CM | POA: Diagnosis present

## 2019-07-02 DIAGNOSIS — E1151 Type 2 diabetes mellitus with diabetic peripheral angiopathy without gangrene: Secondary | ICD-10-CM | POA: Diagnosis present

## 2019-07-02 DIAGNOSIS — E1142 Type 2 diabetes mellitus with diabetic polyneuropathy: Secondary | ICD-10-CM | POA: Diagnosis present

## 2019-07-02 DIAGNOSIS — N400 Enlarged prostate without lower urinary tract symptoms: Secondary | ICD-10-CM | POA: Diagnosis present

## 2019-07-02 DIAGNOSIS — M109 Gout, unspecified: Secondary | ICD-10-CM | POA: Diagnosis present

## 2019-07-02 DIAGNOSIS — Z955 Presence of coronary angioplasty implant and graft: Secondary | ICD-10-CM

## 2019-07-02 DIAGNOSIS — Z87891 Personal history of nicotine dependence: Secondary | ICD-10-CM

## 2019-07-02 DIAGNOSIS — D631 Anemia in chronic kidney disease: Secondary | ICD-10-CM | POA: Diagnosis present

## 2019-07-02 DIAGNOSIS — I252 Old myocardial infarction: Secondary | ICD-10-CM | POA: Diagnosis not present

## 2019-07-02 DIAGNOSIS — N1832 Chronic kidney disease, stage 3b: Secondary | ICD-10-CM

## 2019-07-02 DIAGNOSIS — Z7902 Long term (current) use of antithrombotics/antiplatelets: Secondary | ICD-10-CM | POA: Diagnosis not present

## 2019-07-02 DIAGNOSIS — E785 Hyperlipidemia, unspecified: Secondary | ICD-10-CM | POA: Diagnosis present

## 2019-07-02 DIAGNOSIS — Z8546 Personal history of malignant neoplasm of prostate: Secondary | ICD-10-CM

## 2019-07-02 DIAGNOSIS — D509 Iron deficiency anemia, unspecified: Secondary | ICD-10-CM | POA: Diagnosis not present

## 2019-07-02 DIAGNOSIS — I25110 Atherosclerotic heart disease of native coronary artery with unstable angina pectoris: Secondary | ICD-10-CM | POA: Diagnosis not present

## 2019-07-02 DIAGNOSIS — I200 Unstable angina: Secondary | ICD-10-CM | POA: Diagnosis not present

## 2019-07-02 DIAGNOSIS — I2510 Atherosclerotic heart disease of native coronary artery without angina pectoris: Secondary | ICD-10-CM | POA: Diagnosis present

## 2019-07-02 DIAGNOSIS — E1129 Type 2 diabetes mellitus with other diabetic kidney complication: Secondary | ICD-10-CM | POA: Diagnosis present

## 2019-07-02 DIAGNOSIS — Z7982 Long term (current) use of aspirin: Secondary | ICD-10-CM

## 2019-07-02 DIAGNOSIS — I214 Non-ST elevation (NSTEMI) myocardial infarction: Principal | ICD-10-CM | POA: Diagnosis present

## 2019-07-02 DIAGNOSIS — Z20822 Contact with and (suspected) exposure to covid-19: Secondary | ICD-10-CM | POA: Diagnosis present

## 2019-07-02 DIAGNOSIS — E782 Mixed hyperlipidemia: Secondary | ICD-10-CM | POA: Diagnosis not present

## 2019-07-02 DIAGNOSIS — Z794 Long term (current) use of insulin: Secondary | ICD-10-CM

## 2019-07-02 HISTORY — PX: LEFT HEART CATH AND CORONARY ANGIOGRAPHY: CATH118249

## 2019-07-02 LAB — GLUCOSE, CAPILLARY
Glucose-Capillary: 71 mg/dL (ref 70–99)
Glucose-Capillary: 87 mg/dL (ref 70–99)
Glucose-Capillary: 182 mg/dL — ABNORMAL HIGH (ref 70–99)

## 2019-07-02 SURGERY — LEFT HEART CATH AND CORONARY ANGIOGRAPHY
Anesthesia: Moderate Sedation | Laterality: Left

## 2019-07-02 MED ORDER — ATORVASTATIN CALCIUM 20 MG PO TABS: 40.0000 mg | ORAL_TABLET | Freq: Every day | ORAL | Status: DC

## 2019-07-02 MED ORDER — FENTANYL CITRATE (PF) 100 MCG/2ML IJ SOLN
INTRAMUSCULAR | Status: AC
  Filled 2019-07-02: qty 2

## 2019-07-02 MED ORDER — SODIUM CHLORIDE 0.9 % IV SOLN: 250.0000 mL | INTRAVENOUS | Status: DC | PRN

## 2019-07-02 MED ORDER — DOXAZOSIN MESYLATE 2 MG PO TABS
4.0000 mg | ORAL_TABLET | Freq: Every day | ORAL | Status: DC
  Administered 2019-07-02 – 2019-07-06 (×5): 4 mg via ORAL
  Filled 2019-07-02 (×2): qty 1
  Filled 2019-07-02 (×4): qty 2

## 2019-07-02 MED ORDER — MEMANTINE HCL 10 MG PO TABS
10.0000 mg | ORAL_TABLET | Freq: Two times a day (BID) | ORAL | Status: DC
  Administered 2019-07-02 – 2019-07-07 (×10): 10 mg via ORAL
  Filled 2019-07-02 (×11): qty 1

## 2019-07-02 MED ORDER — FINASTERIDE 5 MG PO TABS
5.0000 mg | ORAL_TABLET | Freq: Every day | ORAL | Status: DC
  Administered 2019-07-03 – 2019-07-07 (×5): 5 mg via ORAL
  Filled 2019-07-02 (×5): qty 1

## 2019-07-02 MED ORDER — INSULIN ASPART 100 UNIT/ML ~~LOC~~ SOLN: 0.0000 [IU] | Freq: Every day | SUBCUTANEOUS | Status: DC

## 2019-07-02 MED ORDER — ACETAMINOPHEN 325 MG PO TABS: 650.0000 mg | ORAL_TABLET | ORAL | Status: DC | PRN

## 2019-07-02 MED ORDER — INSULIN ASPART 100 UNIT/ML ~~LOC~~ SOLN
0.0000 [IU] | Freq: Three times a day (TID) | SUBCUTANEOUS | Status: DC
  Administered 2019-07-02: 3 [IU] via SUBCUTANEOUS
  Filled 2019-07-02: qty 1

## 2019-07-02 MED ORDER — SODIUM CHLORIDE 0.9% FLUSH: 3.0000 mL | Freq: Two times a day (BID) | INTRAVENOUS | Status: DC

## 2019-07-02 MED ORDER — HEPARIN (PORCINE) IN NACL 1000-0.9 UT/500ML-% IV SOLN
INTRAVENOUS | Status: AC
  Filled 2019-07-02: qty 1000

## 2019-07-02 MED ORDER — SODIUM CHLORIDE 0.9% FLUSH: 3.0000 mL | INTRAVENOUS | Status: DC | PRN

## 2019-07-02 MED ORDER — DONEPEZIL HCL 5 MG PO TABS
10.0000 mg | ORAL_TABLET | Freq: Every day | ORAL | Status: DC
  Administered 2019-07-02 – 2019-07-06 (×5): 10 mg via ORAL
  Filled 2019-07-02 (×3): qty 2
  Filled 2019-07-02 (×2): qty 1
  Filled 2019-07-02: qty 2

## 2019-07-02 MED ORDER — MIDAZOLAM HCL 2 MG/2ML IJ SOLN
INTRAMUSCULAR | Status: DC | PRN
  Administered 2019-07-02: 1 mg via INTRAVENOUS

## 2019-07-02 MED ORDER — ONDANSETRON HCL 4 MG/2ML IJ SOLN
4.0000 mg | Freq: Four times a day (QID) | INTRAMUSCULAR | Status: DC | PRN
  Administered 2019-07-05: 4 mg via INTRAVENOUS

## 2019-07-02 MED ORDER — LABETALOL HCL 5 MG/ML IV SOLN: 10.0000 mg | INTRAVENOUS | Status: AC | PRN

## 2019-07-02 MED ORDER — NITROGLYCERIN 0.4 MG SL SUBL: 0.4000 mg | SUBLINGUAL_TABLET | SUBLINGUAL | Status: DC | PRN

## 2019-07-02 MED ORDER — ISOSORBIDE MONONITRATE ER 30 MG PO TB24
30.0000 mg | ORAL_TABLET | Freq: Every day | ORAL | Status: DC
  Administered 2019-07-03: 30 mg via ORAL
  Filled 2019-07-02: qty 1

## 2019-07-02 MED ORDER — ATORVASTATIN CALCIUM 40 MG PO TABS
40.0000 mg | ORAL_TABLET | Freq: Every day | ORAL | Status: DC
  Administered 2019-07-03 – 2019-07-05 (×3): 40 mg via ORAL
  Filled 2019-07-02 (×3): qty 1

## 2019-07-02 MED ORDER — GABAPENTIN 300 MG PO CAPS
300.0000 mg | ORAL_CAPSULE | Freq: Two times a day (BID) | ORAL | Status: DC
  Administered 2019-07-02 – 2019-07-07 (×10): 300 mg via ORAL
  Filled 2019-07-02 (×10): qty 1

## 2019-07-02 MED ORDER — ASPIRIN 81 MG PO CHEW
81.0000 mg | CHEWABLE_TABLET | ORAL | Status: AC
  Administered 2019-07-02: 81 mg via ORAL

## 2019-07-02 MED ORDER — SODIUM CHLORIDE 0.9 % IV SOLN: INTRAVENOUS | Status: AC

## 2019-07-02 MED ORDER — IOHEXOL 300 MG/ML  SOLN
INTRAMUSCULAR | Status: DC | PRN
  Administered 2019-07-02: 90 mL

## 2019-07-02 MED ORDER — ISOSORBIDE MONONITRATE ER 30 MG PO TB24: 30.0000 mg | ORAL_TABLET | Freq: Every day | ORAL | Status: DC

## 2019-07-02 MED ORDER — ASPIRIN 81 MG PO CHEW
CHEWABLE_TABLET | ORAL | Status: AC
  Filled 2019-07-02: qty 1

## 2019-07-02 MED ORDER — HYDRALAZINE HCL 20 MG/ML IJ SOLN
10.0000 mg | INTRAMUSCULAR | Status: AC | PRN
  Administered 2019-07-02: 10 mg via INTRAVENOUS
  Filled 2019-07-02: qty 1

## 2019-07-02 MED ORDER — AMLODIPINE BESYLATE 10 MG PO TABS
10.0000 mg | ORAL_TABLET | Freq: Every day | ORAL | Status: DC
  Administered 2019-07-03 – 2019-07-07 (×5): 10 mg via ORAL
  Filled 2019-07-02 (×5): qty 1

## 2019-07-02 MED ORDER — DOXAZOSIN MESYLATE 4 MG PO TABS
4.0000 mg | ORAL_TABLET | Freq: Every day | ORAL | Status: DC
  Filled 2019-07-02: qty 1

## 2019-07-02 MED ORDER — ASCORBIC ACID 500 MG PO TABS
1000.0000 mg | ORAL_TABLET | Freq: Every day | ORAL | Status: DC
  Administered 2019-07-03 – 2019-07-07 (×5): 1000 mg via ORAL
  Filled 2019-07-02 (×5): qty 2

## 2019-07-02 MED ORDER — CARVEDILOL 6.25 MG PO TABS
6.2500 mg | ORAL_TABLET | Freq: Two times a day (BID) | ORAL | Status: DC
  Administered 2019-07-02 – 2019-07-07 (×9): 6.25 mg via ORAL
  Filled 2019-07-02 (×9): qty 1

## 2019-07-02 MED ORDER — ACETAMINOPHEN 325 MG PO TABS: 650.0000 mg | ORAL_TABLET | Freq: Four times a day (QID) | ORAL | Status: DC | PRN

## 2019-07-02 MED ORDER — FOLIC ACID 400 MCG PO TABS: 400.0000 ug | ORAL_TABLET | ORAL | Status: DC

## 2019-07-02 MED ORDER — INSULIN ASPART 100 UNIT/ML ~~LOC~~ SOLN
0.0000 [IU] | Freq: Every day | SUBCUTANEOUS | Status: DC
  Administered 2019-07-06: 2 [IU] via SUBCUTANEOUS

## 2019-07-02 MED ORDER — VERAPAMIL HCL 2.5 MG/ML IV SOLN
INTRAVENOUS | Status: AC
  Filled 2019-07-02: qty 2

## 2019-07-02 MED ORDER — ACETAMINOPHEN 650 MG RE SUPP: 650.0000 mg | Freq: Four times a day (QID) | RECTAL | Status: DC | PRN

## 2019-07-02 MED ORDER — HEPARIN SODIUM (PORCINE) 1000 UNIT/ML IJ SOLN
INTRAMUSCULAR | Status: DC | PRN
  Administered 2019-07-02: 4000 [IU] via INTRAVENOUS

## 2019-07-02 MED ORDER — ONDANSETRON HCL 4 MG/2ML IJ SOLN: 4.0000 mg | Freq: Four times a day (QID) | INTRAMUSCULAR | Status: DC | PRN

## 2019-07-02 MED ORDER — LOSARTAN POTASSIUM 50 MG PO TABS
100.0000 mg | ORAL_TABLET | Freq: Every day | ORAL | Status: DC
  Administered 2019-07-03 – 2019-07-07 (×5): 100 mg via ORAL
  Filled 2019-07-02 (×5): qty 2

## 2019-07-02 MED ORDER — SERTRALINE HCL 25 MG PO TABS
25.0000 mg | ORAL_TABLET | Freq: Every day | ORAL | Status: DC
  Filled 2019-07-02: qty 1

## 2019-07-02 MED ORDER — GABAPENTIN 300 MG PO CAPS: 300.0000 mg | ORAL_CAPSULE | Freq: Two times a day (BID) | ORAL | Status: DC

## 2019-07-02 MED ORDER — MIDAZOLAM HCL 2 MG/2ML IJ SOLN
INTRAMUSCULAR | Status: AC
  Filled 2019-07-02: qty 2

## 2019-07-02 MED ORDER — FLAX SEED OIL 1000 MG PO CAPS: 1000.0000 mg | ORAL_CAPSULE | ORAL | Status: DC

## 2019-07-02 MED ORDER — HEPARIN (PORCINE) 25000 UT/250ML-% IV SOLN
950.0000 [IU]/h | INTRAVENOUS | Status: DC
  Filled 2019-07-02: qty 250

## 2019-07-02 MED ORDER — HEPARIN (PORCINE) 25000 UT/250ML-% IV SOLN
1050.0000 [IU]/h | INTRAVENOUS | Status: DC
  Administered 2019-07-03 – 2019-07-05 (×3): 1050 [IU]/h via INTRAVENOUS
  Filled 2019-07-02 (×3): qty 250

## 2019-07-02 MED ORDER — SODIUM CHLORIDE 0.9 % WEIGHT BASED INFUSION: 1.0000 mL/kg/h | INTRAVENOUS | Status: DC

## 2019-07-02 MED ORDER — FERROUS SULFATE 325 (65 FE) MG PO TABS: 325.0000 mg | ORAL_TABLET | Freq: Every day | ORAL | Status: DC

## 2019-07-02 MED ORDER — FINASTERIDE 5 MG PO TABS: 5.0000 mg | ORAL_TABLET | Freq: Every day | ORAL | Status: DC

## 2019-07-02 MED ORDER — ONDANSETRON HCL 4 MG PO TABS: 4.0000 mg | ORAL_TABLET | Freq: Four times a day (QID) | ORAL | Status: DC | PRN

## 2019-07-02 MED ORDER — FOLIC ACID 1 MG PO TABS
0.5000 mg | ORAL_TABLET | ORAL | Status: DC
  Administered 2019-07-05: 0.5 mg via ORAL
  Filled 2019-07-02: qty 1

## 2019-07-02 MED ORDER — HEPARIN SODIUM (PORCINE) 1000 UNIT/ML IJ SOLN
INTRAMUSCULAR | Status: AC
  Filled 2019-07-02: qty 1

## 2019-07-02 MED ORDER — AMLODIPINE BESYLATE 10 MG PO TABS: 10.0000 mg | ORAL_TABLET | Freq: Every day | ORAL | Status: DC

## 2019-07-02 MED ORDER — ASPIRIN EC 81 MG PO TBEC: 81.0000 mg | DELAYED_RELEASE_TABLET | Freq: Every day | ORAL | Status: DC

## 2019-07-02 MED ORDER — INSULIN ASPART 100 UNIT/ML ~~LOC~~ SOLN
0.0000 [IU] | Freq: Three times a day (TID) | SUBCUTANEOUS | Status: DC
  Administered 2019-07-03: 3 [IU] via SUBCUTANEOUS
  Administered 2019-07-03: 2 [IU] via SUBCUTANEOUS
  Administered 2019-07-04: 5 [IU] via SUBCUTANEOUS
  Administered 2019-07-07: 2 [IU] via SUBCUTANEOUS

## 2019-07-02 MED ORDER — VERAPAMIL HCL 2.5 MG/ML IV SOLN
INTRAVENOUS | Status: DC | PRN
  Administered 2019-07-02: 2.5 mg via INTRA_ARTERIAL

## 2019-07-02 MED ORDER — DEXTROSE 50 % IV SOLN
25.0000 mL | Freq: Once | INTRAVENOUS | Status: AC
  Administered 2019-07-02: 25 mL via INTRAVENOUS

## 2019-07-02 MED ORDER — FERROUS SULFATE 325 (65 FE) MG PO TABS
325.0000 mg | ORAL_TABLET | Freq: Every day | ORAL | Status: DC
  Administered 2019-07-03 – 2019-07-07 (×5): 325 mg via ORAL
  Filled 2019-07-02 (×5): qty 1

## 2019-07-02 MED ORDER — SELENIUM 50 MCG PO TABS
50.0000 ug | ORAL_TABLET | ORAL | Status: DC
  Filled 2019-07-02 (×3): qty 1

## 2019-07-02 MED ORDER — INSULIN GLARGINE 100 UNIT/ML ~~LOC~~ SOLN
20.0000 [IU] | Freq: Every day | SUBCUTANEOUS | Status: DC
  Administered 2019-07-02 – 2019-07-06 (×4): 20 [IU] via SUBCUTANEOUS
  Filled 2019-07-02 (×7): qty 0.2

## 2019-07-02 MED ORDER — BETA CAROTENE 25000 UNITS PO CAPS: 25000.0000 [IU] | ORAL_CAPSULE | Freq: Every day | ORAL | Status: DC

## 2019-07-02 MED ORDER — DEXTROSE 50 % IV SOLN
INTRAVENOUS | Status: AC
  Filled 2019-07-02: qty 50

## 2019-07-02 MED ORDER — SENNOSIDES-DOCUSATE SODIUM 8.6-50 MG PO TABS: 1.0000 | ORAL_TABLET | Freq: Every evening | ORAL | Status: DC | PRN

## 2019-07-02 MED ORDER — INSULIN GLARGINE 100 UNIT/ML ~~LOC~~ SOLN
20.0000 [IU] | Freq: Every day | SUBCUTANEOUS | Status: DC
  Filled 2019-07-02: qty 0.2

## 2019-07-02 MED ORDER — ASPIRIN EC 81 MG PO TBEC
81.0000 mg | DELAYED_RELEASE_TABLET | Freq: Every day | ORAL | Status: DC
  Administered 2019-07-03 – 2019-07-07 (×5): 81 mg via ORAL
  Filled 2019-07-02 (×5): qty 1

## 2019-07-02 MED ORDER — CLOPIDOGREL BISULFATE 75 MG PO TABS
75.0000 mg | ORAL_TABLET | Freq: Every day | ORAL | Status: DC
  Administered 2019-07-03 – 2019-07-07 (×5): 75 mg via ORAL
  Filled 2019-07-02 (×6): qty 1

## 2019-07-02 MED ORDER — ALLOPURINOL 100 MG PO TABS
100.0000 mg | ORAL_TABLET | Freq: Every day | ORAL | Status: DC
  Administered 2019-07-03 – 2019-07-07 (×5): 100 mg via ORAL
  Filled 2019-07-02 (×5): qty 1

## 2019-07-02 MED ORDER — MEMANTINE HCL 10 MG PO TABS
10.0000 mg | ORAL_TABLET | Freq: Two times a day (BID) | ORAL | Status: DC
  Filled 2019-07-02 (×2): qty 1

## 2019-07-02 MED ORDER — ALLOPURINOL 100 MG PO TABS
100.0000 mg | ORAL_TABLET | Freq: Every day | ORAL | Status: DC
  Filled 2019-07-02: qty 1

## 2019-07-02 MED ORDER — SODIUM CHLORIDE 0.9 % WEIGHT BASED INFUSION
3.0000 mL/kg/h | INTRAVENOUS | Status: DC
  Administered 2019-07-02: 3 mL/kg/h via INTRAVENOUS

## 2019-07-02 MED ORDER — DEXTROSE 50 % IV SOLN: 50.0000 mL | Freq: Once | INTRAVENOUS | Status: DC

## 2019-07-02 MED ORDER — FENTANYL CITRATE (PF) 100 MCG/2ML IJ SOLN
INTRAMUSCULAR | Status: DC | PRN
  Administered 2019-07-02: 25 ug via INTRAVENOUS

## 2019-07-02 MED ORDER — HEPARIN (PORCINE) IN NACL 1000-0.9 UT/500ML-% IV SOLN
INTRAVENOUS | Status: DC | PRN
  Administered 2019-07-02: 500 mL

## 2019-07-02 MED ORDER — DONEPEZIL HCL 5 MG PO TABS
10.0000 mg | ORAL_TABLET | Freq: Every day | ORAL | Status: DC
  Filled 2019-07-02: qty 2

## 2019-07-02 MED ORDER — CLOPIDOGREL BISULFATE 75 MG PO TABS: 75.0000 mg | ORAL_TABLET | Freq: Every day | ORAL | Status: DC

## 2019-07-02 MED ORDER — SERTRALINE HCL 25 MG PO TABS
25.0000 mg | ORAL_TABLET | Freq: Every day | ORAL | Status: DC
  Administered 2019-07-03 – 2019-07-07 (×5): 25 mg via ORAL
  Filled 2019-07-02 (×5): qty 1

## 2019-07-02 SURGICAL SUPPLY — 11 items
CATH 5F 110X4 TIG (CATHETERS) ×2 IMPLANT
CATH INFINITI 5 FR JL3.5 (CATHETERS) ×2 IMPLANT
CATH INFINITI JR4 5F (CATHETERS) ×2 IMPLANT
CATH LAUNCHER 5F EBU3.0 (CATHETERS) ×1 IMPLANT
CATHETER LAUNCHER 5F EBU3.0 (CATHETERS) ×2
DEVICE RAD TR BAND REGULAR (VASCULAR PRODUCTS) ×2 IMPLANT
GLIDESHEATH SLEND SS 6F .021 (SHEATH) ×2 IMPLANT
GUIDEWIRE INQWIRE 1.5J.035X260 (WIRE) ×1 IMPLANT
INQWIRE 1.5J .035X260CM (WIRE) ×2
KIT MANI 3VAL PERCEP (MISCELLANEOUS) ×2 IMPLANT
PACK CARDIAC CATH (CUSTOM PROCEDURE TRAY) ×2 IMPLANT

## 2019-07-02 NOTE — Brief Op Note (Addendum)
BRIEF CARDIAC CATHETERIZATION  07/02/2019  2:06 PM  PATIENT:  Peter [name]  86 y.o. male  PRE-OPERATIVE DIAGNOSIS:  Unstable angina  POST-OPERATIVE DIAGNOSIS:  Unstable angina  PROCEDURE:  Procedure(s): LEFT HEART CATH AND CORONARY ANGIOGRAPHY (Left)  SURGEON:  Surgeon(s) and Role:    [name], MD - Primary  FINDINGS: 1. Severe single vessel coronary artery disease with 80-90% proximal LAD.  Lesion is suboptimally visualized due to difficult catheter engagement but appears to be heavily calcified. 2. Mild to moderate mid/distal LAD, LCx and RCA/rPDA disease. 3. Mildly elevated left ventricular filling pressure with hyperdynamic LV contraction. 4. Tortuous right subclavian/brachiocephalic artery.  Recommend alternate access for further catheterizations.  RECOMMENDATIONS: 1. Transfer to [facility] for continued monitoring and medical optimization.  Anticipate PCI to proximal LAD (potentially with atherectomy) on Monday via femoral approach. 2. Gentle post-catheterization hydration given chronic kidney disease. 3. Aggressive secondary prevention, including improved blood pressure control. 4. Start IV heparin 2 hours after TR band removal.  [name], MD [place]

## 2019-07-02 NOTE — Progress Notes (Signed)
ANTICOAGULATION CONSULT NOTE - Initial Consult  Pharmacy Consult for Heparin Indication: chest pain/ACS  Allergies  Allergen Reactions  . Penicillins Anaphylaxis    Reported airway compromise    Patient Measurements: Height: 5\' 8"  (172.7 cm) Weight: 78.5 kg (173 lb) IBW/kg (Calculated) : 68.4 Heparin Dosing Weight: 78.5 kg  Vital Signs: Temp: 98.3 F (36.8 C) (05/21 1613) BP: 176/58 (05/21 1613) Pulse Rate: 83 (05/21 1613)  Labs: Recent Labs    06/30/19 1714  HGB 10.0*  HCT 29.5*  PLT 305  LABPROT 12.7  INR 1.0  CREATININE 1.40*    Estimated Creatinine Clearance: 36 mL/min (A) (by C-G formula based on SCr of 1.4 mg/dL (H)).   Assessment: 86 yo male s/p cath to start heparin  2 hr post TR band removal. TR band removed at 1510 per [facility] RN.  Plan for transfer to [facility] for further procedure on 07/05/19.   Goal of Therapy:  Heparin level 0.3-0.7 units/ml Monitor platelets by anticoagulation protocol: Yes   Plan:  Will start heparin drip at 950 units/hr (=9.5 ml/hr) with no bolus at 1710, discussed plan with floor RN Heparin level 8h after start of heparin drip CBC in AM  Pharmacy will continue to follow   Rocky Morel 07/02/2019,4:17 PM
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
## 2019-07-02 NOTE — Plan of Care (Signed)
Patient arrived from cath lab in no distress.

## 2019-07-02 NOTE — Progress Notes (Signed)
Pt seen by Anne Ng.  SBP now in 160's after 10mg  IV hydralazine given.  Pt transferred via stretcher with  [facility] to go to [facility] for further cardiac workup and eval.

## 2019-07-02 NOTE — Interval H&P Note (Signed)
History and Physical Interval Note:  07/02/2019 12:53 PM  Peter [name]  has presented today for surgery, with unstable angina.  The various methods of treatment have been discussed with the patient and family. After consideration of risks, benefits and other options for treatment, the patient has consented to  Procedure(s): LEFT HEART CATH AND CORONARY ANGIOGRAPHY (Left) as a surgical intervention.  The patient's history has been reviewed, patient examined, no change in status, stable for surgery.  I have reviewed the patient's chart and labs.  Questions were answered to the patient's satisfaction.    Cath Lab Visit (complete for each Cath Lab visit)  Clinical Evaluation Leading to the Procedure:   ACS: Yes.  (unstable angina)  Non-ACS:  N/A  Guy Seese

## 2019-07-02 NOTE — Progress Notes (Signed)
Report called to [place], [place] 4 E.  Report also given to EMS.  Pt notified of transfer tonight.
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
## 2019-07-03 DIAGNOSIS — I214 Non-ST elevation (NSTEMI) myocardial infarction: Principal | ICD-10-CM

## 2019-07-03 DIAGNOSIS — E782 Mixed hyperlipidemia: Secondary | ICD-10-CM

## 2019-07-03 DIAGNOSIS — I2510 Atherosclerotic heart disease of native coronary artery without angina pectoris: Secondary | ICD-10-CM

## 2019-07-03 LAB — BASIC METABOLIC PANEL
Anion gap: 7 (ref 5–15)
BUN: 34 mg/dL — ABNORMAL HIGH (ref 8–23)
CO2: 24 mmol/L (ref 22–32)
Calcium: 7.7 mg/dL — ABNORMAL LOW (ref 8.9–10.3)
Chloride: 109 mmol/L (ref 98–111)
Creatinine, Ser: 1.52 mg/dL — ABNORMAL HIGH (ref 0.61–1.24)
GFR calc Af Amer: 47 mL/min — ABNORMAL LOW (ref >60)
GFR calc non Af Amer: 41 mL/min — ABNORMAL LOW (ref >60)
Glucose, Bld: 114 mg/dL — ABNORMAL HIGH (ref 70–99)
Potassium: 4.3 mmol/L (ref 3.5–5.1)
Sodium: 140 mmol/L (ref 135–145)

## 2019-07-03 LAB — GLUCOSE, CAPILLARY
Glucose-Capillary: 83 mg/dL (ref 70–99)
Glucose-Capillary: 135 mg/dL — ABNORMAL HIGH (ref 70–99)
Glucose-Capillary: 184 mg/dL — ABNORMAL HIGH (ref 70–99)
Glucose-Capillary: 180 mg/dL — ABNORMAL HIGH (ref 70–99)

## 2019-07-03 LAB — CBC
HCT: 25.5 % — ABNORMAL LOW (ref 39.0–52.0)
Hemoglobin: 8.2 g/dL — ABNORMAL LOW (ref 13.0–17.0)
MCH: 28.5 pg (ref 26.0–34.0)
MCHC: 32.2 g/dL (ref 30.0–36.0)
MCV: 88.5 fL (ref 80.0–100.0)
Platelets: 275 10*3/uL (ref 150–400)
RBC: 2.88 MIL/uL — ABNORMAL LOW (ref 4.22–5.81)
RDW: 14.6 % (ref 11.5–15.5)
WBC: 10.3 10*3/uL (ref 4.0–10.5)
nRBC: 0 % (ref 0.0–0.2)

## 2019-07-03 LAB — PROTIME-INR
INR: 1.1 (ref 0.8–1.2)
Prothrombin Time: 13.9 seconds (ref 11.4–15.2)

## 2019-07-03 LAB — HEPARIN LEVEL (UNFRACTIONATED)
Heparin Unfractionated: 0.24 IU/mL — ABNORMAL LOW (ref 0.30–0.70)
Heparin Unfractionated: 0.61 IU/mL (ref 0.30–0.70)

## 2019-07-03 MED ORDER — ISOSORBIDE MONONITRATE ER 60 MG PO TB24
60.0000 mg | ORAL_TABLET | Freq: Every day | ORAL | Status: DC
  Administered 2019-07-04 – 2019-07-07 (×4): 60 mg via ORAL
  Filled 2019-07-03 (×4): qty 1

## 2019-07-03 MED ORDER — SODIUM CHLORIDE 0.9 % IV SOLN: INTRAVENOUS | Status: DC

## 2019-07-03 NOTE — Progress Notes (Addendum)
Progress Note  Patient Name: Peter [name] Date of Encounter: 07/03/2019  Primary Cardiologist: [name], MD   Subjective   The patient is currently chest pain-free, no shortness of breath.  Inpatient Medications    Scheduled Meds: . allopurinol  100 mg Oral Daily  . amLODipine  10 mg Oral Daily  . vitamin C  1,000 mg Oral Daily  . aspirin EC  81 mg Oral Daily  . atorvastatin  40 mg Oral Daily  . carvedilol  6.25 mg Oral BID WC  . clopidogrel  75 mg Oral Daily  . donepezil  10 mg Oral QHS  . doxazosin  4 mg Oral QHS  . ferrous sulfate  325 mg Oral Daily  . finasteride  5 mg Oral Daily  . [START ON 0000000 folic acid  0.5 mg Oral Once per day on Mon Thu  . gabapentin  300 mg Oral BID  . insulin aspart  0-15 Units Subcutaneous TID WC  . insulin aspart  0-5 Units Subcutaneous QHS  . insulin glargine  20 Units Subcutaneous QHS  . isosorbide mononitrate  30 mg Oral Daily  . losartan  100 mg Oral Daily  . memantine  10 mg Oral BID  . [START ON 07/05/2019] selenium  50 mcg Oral Once per day on Mon Thu  . sertraline  25 mg Oral Daily   Continuous Infusions: . heparin     PRN Meds: acetaminophen, nitroGLYCERIN, ondansetron (ZOFRAN) IV   Vital Signs    Vitals:   07/02/19 2330 07/03/19 0412  BP: (!) 182/70 (!) 157/56  Pulse: 82 60  Resp: 19 16  Temp: 98.8 F (37.1 C) 98.7 F (37.1 C)  TempSrc: Oral Oral  SpO2: 99% 97%  Weight: 78.9 kg 78 kg    Intake/Output Summary (Last 24 hours) at 07/03/2019 0909 Last data filed at 07/03/2019 0615 Gross per 24 hour  Intake 335 ml  Output -  Net 335 ml   Last 3 Weights 07/03/2019 07/02/2019 07/02/2019  Weight (lbs) 171 lb 15.3 oz 173 lb 15.1 oz 173 lb 6.4 oz  Weight (kg) 78 kg 78.9 kg 78.654 kg      Telemetry    Sinus rhythm- Personally Reviewed  ECG    Sinus rhythm, negative T waves in the lateral leads, unchanged from prior EKG on April 05, 2019- Personally Reviewed  Physical Exam   GEN: No acute  distress.   Neck: No JVD Cardiac: RRR, 3 out of 6 systolic murmurs, rubs, or gallops.  Respiratory: Clear to auscultation bilaterally. GI: Soft, nontender, non-distended  MS: No edema; No deformity. Neuro:  Nonfocal  Psych: Normal affect   Labs    High Sensitivity Troponin:   Recent Labs  Lab 06/27/19 0228 06/27/19 0318  TROPONINIHS 38* 47*      Chemistry Recent Labs  Lab 06/27/19 0228 06/30/19 1714 07/03/19 0319  NA 140 139 140  K 4.3 4.4 4.3  CL 109 107 109  CO2 25 21* 24  GLUCOSE 192* 131* 114*  BUN 45* 36* 34*  CREATININE 1.59* 1.40* 1.52*  CALCIUM 8.2* 8.4* 7.7*  PROT 6.1*  --   --   ALBUMIN 3.3*  --   --   AST 31  --   --   ALT 56*  --   --   ALKPHOS 101  --   --   BILITOT 0.6  --   --   GFRNONAA 38* 45* 41*  GFRAA 45* 52* 47*  ANIONGAP 6 11  7     Hematology Recent Labs  Lab 06/27/19 0228 06/30/19 1714 07/03/19 0319  WBC 11.9* 11.5* 10.3  RBC 3.25* 3.47* 2.88*  HGB 9.3* 10.0* 8.2*  HCT 28.1* 29.5* 25.5*  MCV 86.5 85.0 88.5  MCH 28.6 28.8 28.5  MCHC 33.1 33.9 32.2  RDW 14.1 14.0 14.6  PLT 297 305 275    BNPNo results for input(s): BNP, PROBNP in the last 168 hours.   DDimer No results for input(s): DDIMER in the last 168 hours.   Radiology    CARDIAC CATHETERIZATION  Addendum Date: 07/02/2019   Conclusions: 1. Severe single vessel coronary artery disease with 80-90% proximal LAD.  Lesion is suboptimally visualized due to difficult catheter engagement but appears to be heavily calcified. 2. Mild to moderate mid/distal LAD, LCx and RCA/rPDA disease. 3. Mildly elevated left ventricular filling pressure with hyperdynamic LV contraction. 4. Tortuous right subclavian/brachiocephalic artery.  Recommend alternate access for further catheterizations.  Recommendations: 1. Transfer to [facility] for continued monitoring and medical optimization.  Anticipate PCI to proximal LAD (potentially with atherectomy) on Monday via femoral approach. 2. Gentle  post-catheterization hydration given chronic kidney disease. 3. Aggressive secondary prevention, including improved blood pressure control. 4. Start IV heparin 2 hours after TR band removal. [name], MD [facility]   Result Date: 07/02/2019 Conclusions: 1. Severe single vessel coronary artery disease with 80-90% proximal LAD.  Lesion is suboptimally visualized due to difficult catheter engagement but appears to be heavily calcified. 2. Mild to moderate mid/distal LAD, LCx and RCA/rPDA disease. 3. Mildly elevated left ventricular filling pressure with hyperdynamic LV contraction. 4. Tortuous right subclavian/brachiocephalic artery.  Recommend alternate access for further catheterizations.  Recommendations: 1. Transfer to [facility] for continued monitoring and medical optimization.  Anticipate PCI to proximal LAD (potentially with atherectomy) on Monday via femoral approach. 2. Gentle post-catheterization hydration given chronic kidney disease. 3. Aggressive secondary prevention, including improved blood pressure control. 4. Start IV heparin 2 hours after TR band removal. [name], MD [facility]    Cardiac Studies   06/27/2019  1. Left ventricular ejection fraction, by estimation, is 60 to 65%. The  left ventricle has normal function. The left ventricle has no regional  wall motion abnormalities. There is moderate left ventricular hypertrophy.  Left ventricular diastolic  parameters are consistent with Grade I diastolic dysfunction (impaired  relaxation).  2. Right ventricular systolic function is normal. The right ventricular  size is normal.  3. Left atrial size was mildly dilated.  4. Tricuspid valve regurgitation is mild to moderate.    Patient Profile     86 y.o. male with known CAD, status post cardiac catheterization yesterday  Assessment & Plan    1.  CAD, NSTEMI, cath on 5/21 with findings of Severe single vessel coronary artery disease with 80-90%  proximal LAD.  Lesion is suboptimally visualized due to difficult catheter engagement but appears to be heavily calcified. 2. Mild to moderate mid/distal LAD, LCx and RCA/rPDA disease. 3. Mildly elevated left ventricular filling pressure with hyperdynamic LV contraction. 4. Tortuous right subclavian/brachiocephalic artery.  Recommend alternate access for further catheterizations.  Recommendations: 1. Transfer to [facility] for continued monitoring and medical optimization.  Anticipate PCI to proximal LAD (potentially with atherectomy) on Monday via femoral approach.   2.  CKD stage III, we will hydrate over the weekend.  LVEF is normal, patient has no signs of fluid overload.  3.  Hypertension -increase Imdur to 60 mg daily.  Continue amlodipine  and carvedilol and losartan.  For questions or updates, please contact [place] Please consult [email or web address] for contact info under        Signed, Ena Dawley, MD  07/03/2019, 9:09 AM

## 2019-07-03 NOTE — Progress Notes (Signed)
Peter [name] for Heparin Indication: chest pain/ACS  Allergies  Allergen Reactions  . Penicillins Anaphylaxis    Reported airway compromise    Patient Measurements: Weight: 78 kg (171 lb 15.3 oz) Heparin Dosing Weight: 78.5 kg  Vital Signs: Temp: 98.7 F (37.1 C) (05/22 0412) Temp Source: Oral (05/22 0412) BP: 157/56 (05/22 0412) Pulse Rate: 60 (05/22 0412)  Labs: Recent Labs    06/30/19 1714 07/03/19 0319  HGB 10.0* 8.2*  HCT 29.5* 25.5*  PLT 305 275  LABPROT 12.7 13.9  INR 1.0 1.1  HEPARINUNFRC  --  0.24*  CREATININE 1.40* 1.52*    Estimated Creatinine Clearance: 35.4 mL/min (A) (by C-G formula based on SCr of 1.52 mg/dL (H)).   Assessment: 86 yo male with CAD awaiting PCI for heparin  Goal of Therapy:  Heparin level 0.3-0.7 units/ml Monitor platelets by anticoagulation protocol: Yes   Plan:  Increase Heparin 1100 units/hr Check heparin level in 8 hours.   Caryl Pina 07/03/2019,6:12 AM

## 2019-07-03 NOTE — H&P (Signed)
Interval note (transfer)  Patient transferred from OSH for complex PCI of LAD on Monday His LHC today showed (right radial approach)  FINDINGS: 1. Severe single vessel coronary artery disease with 80-90% proximal LAD. Lesion is suboptimally visualized due to difficult catheter engagement but appears to be heavily calcified. 2. Mild to moderate mid/distal LAD, LCx and RCA/rPDA disease. 3. Mildly elevated left ventricular filling pressure with hyperdynamic LV contraction. 4. Tortuous right subclavian/brachiocephalic artery.  Recommend alternate access for further catheterizations.  RECOMMENDATIONS: 1. Transfer to [facility] for continued monitoring and medical optimization. Anticipate PCI to proximal LAD (potentially with atherectomy) on Monday via femoral approach. 2. Gentle post-catheterization hydration given chronic kidney disease. 3. Aggressive secondary prevention, including improved blood pressure control. 4. Start IV heparin 2 hours after TR band removal.   He is currently stable, no chest pain, on heparin gtt.  Exam- non focal  Plan: - Resume home medications, dapt, heparin  gtt and start coreg 6.25mg  BID for HTN - Plan for PCI on Monday (LHC prox LAD with atherectomy)

## 2019-07-03 NOTE — Progress Notes (Signed)
[place] for Heparin Indication: chest pain/ACS  Allergies  Allergen Reactions  . Penicillins Anaphylaxis    Reported airway compromise    Patient Measurements: Weight: 78 kg (171 lb 15.3 oz) Heparin Dosing Weight: 78.5 kg  Vital Signs: Temp: 98.3 F (36.8 C) (05/22 1159) Temp Source: Oral (05/22 1159) BP: 137/60 (05/22 1159) Pulse Rate: 49 (05/22 1159)  Labs: Recent Labs    06/30/19 1714 07/03/19 0319 07/03/19 1304  HGB 10.0* 8.2*  --   HCT 29.5* 25.5*  --   PLT 305 275  --   LABPROT 12.7 13.9  --   INR 1.0 1.1  --   HEPARINUNFRC  --  0.24* 0.61  CREATININE 1.40* 1.52*  --     Estimated Creatinine Clearance: 35.4 mL/min (A) (by C-G formula based on SCr of 1.52 mg/dL (H)).  Assessment: 86 yo male with CAD awaiting PCI (planned for 5/24). Pharmacy consulted to dose heparin.  Heparin level this afternoon is therapeutic at 0.61. Will slightly decrease rate due to large increase in level after last rate increase. Will repeat level tomorrow AM.   Hgb 8.2, plts stable. No bleeding noted per nursing.   Goal of Therapy:  Heparin level 0.3-0.7 units/ml Monitor platelets by anticoagulation protocol: Yes   Plan:  Decrease Heparin to 1050 units/hr Monitor daily heparin level and CBC Monitor for s/sx of bleeding   Kennon Holter, PharmD PGY1 Ambulatory Care Pharmacy Resident 07/03/2019,1:55 PM

## 2019-07-03 NOTE — Progress Notes (Signed)
Patient received via [facility] alert and orin. With no complaints. R.N. aware of arrival and into assess patient. Orin. Patient to room. Unable to get into patient chart open . Dr. Rudi Rummage  Was paged around 22:00 and made aware of patient arrival.Patient came with Heparin going at 950 units / hr. (.9.5 ml/hr on pump.

## 2019-07-04 ENCOUNTER — Encounter (HOSPITAL_COMMUNITY): Payer: Self-pay | Admitting: Cardiology

## 2019-07-04 DIAGNOSIS — F0390 Unspecified dementia without behavioral disturbance: Secondary | ICD-10-CM

## 2019-07-04 LAB — CBC
HCT: 26.2 % — ABNORMAL LOW (ref 39.0–52.0)
Hemoglobin: 8.4 g/dL — ABNORMAL LOW (ref 13.0–17.0)
MCH: 28.4 pg (ref 26.0–34.0)
MCHC: 32.1 g/dL (ref 30.0–36.0)
MCV: 88.5 fL (ref 80.0–100.0)
Platelets: 274 10*3/uL (ref 150–400)
RBC: 2.96 MIL/uL — ABNORMAL LOW (ref 4.22–5.81)
RDW: 14.4 % (ref 11.5–15.5)
WBC: 10.3 10*3/uL (ref 4.0–10.5)
nRBC: 0 % (ref 0.0–0.2)

## 2019-07-04 LAB — HEPARIN LEVEL (UNFRACTIONATED): Heparin Unfractionated: 0.55 IU/mL (ref 0.30–0.70)

## 2019-07-04 LAB — GLUCOSE, CAPILLARY
Glucose-Capillary: 55 mg/dL — ABNORMAL LOW (ref 70–99)
Glucose-Capillary: 93 mg/dL (ref 70–99)
Glucose-Capillary: 104 mg/dL — ABNORMAL HIGH (ref 70–99)
Glucose-Capillary: 205 mg/dL — ABNORMAL HIGH (ref 70–99)
Glucose-Capillary: 158 mg/dL — ABNORMAL HIGH (ref 70–99)

## 2019-07-04 MED ORDER — SODIUM CHLORIDE 0.9 % IV SOLN: 250.0000 mL | INTRAVENOUS | Status: DC | PRN

## 2019-07-04 MED ORDER — SODIUM CHLORIDE 0.9 % WEIGHT BASED INFUSION: 3.0000 mL/kg/h | INTRAVENOUS | Status: DC

## 2019-07-04 MED ORDER — SODIUM CHLORIDE 0.9% FLUSH: 3.0000 mL | INTRAVENOUS | Status: DC | PRN

## 2019-07-04 MED ORDER — SODIUM CHLORIDE 0.9 % WEIGHT BASED INFUSION
1.0000 mL/kg/h | INTRAVENOUS | Status: DC
  Administered 2019-07-05: 1 mL/kg/h via INTRAVENOUS

## 2019-07-04 MED ORDER — SODIUM CHLORIDE 0.9% FLUSH
3.0000 mL | Freq: Two times a day (BID) | INTRAVENOUS | Status: DC
  Administered 2019-07-04: 3 mL via INTRAVENOUS

## 2019-07-04 MED ORDER — ASPIRIN 81 MG PO CHEW
81.0000 mg | CHEWABLE_TABLET | ORAL | Status: AC
  Administered 2019-07-05: 81 mg via ORAL
  Filled 2019-07-04: qty 1

## 2019-07-04 NOTE — Progress Notes (Addendum)
Progress Note  Patient Name: Peter [name] Date of Encounter: 07/04/2019  Primary Cardiologist: [name], MD   Subjective   No chest pain since the admission, cath scheduled for tomorrow.  Inpatient Medications    Scheduled Meds: . allopurinol  100 mg Oral Daily  . amLODipine  10 mg Oral Daily  . vitamin C  1,000 mg Oral Daily  . aspirin EC  81 mg Oral Daily  . atorvastatin  40 mg Oral Daily  . carvedilol  6.25 mg Oral BID WC  . clopidogrel  75 mg Oral Daily  . donepezil  10 mg Oral QHS  . doxazosin  4 mg Oral QHS  . ferrous sulfate  325 mg Oral Daily  . finasteride  5 mg Oral Daily  . [START ON 0000000 folic acid  0.5 mg Oral Once per day on Mon Thu  . gabapentin  300 mg Oral BID  . insulin aspart  0-15 Units Subcutaneous TID WC  . insulin aspart  0-5 Units Subcutaneous QHS  . insulin glargine  20 Units Subcutaneous QHS  . isosorbide mononitrate  60 mg Oral Daily  . losartan  100 mg Oral Daily  . memantine  10 mg Oral BID  . [START ON 07/05/2019] selenium  50 mcg Oral Once per day on Mon Thu  . sertraline  25 mg Oral Daily   Continuous Infusions: . sodium chloride 75 mL/hr at 07/03/19 2341  . heparin 1,050 Units/hr (07/03/19 2344)   PRN Meds: acetaminophen, nitroGLYCERIN, ondansetron (ZOFRAN) IV   Vital Signs    Vitals:   07/03/19 2342 07/04/19 0524 07/04/19 0826 07/04/19 1117  BP: (!) 148/55 (!) 174/53 (!) 148/108 (!) 146/49  Pulse:  62 60 65  Resp: 18 16 16 16   Temp: 98.3 F (36.8 C) 98 F (36.7 C) 98.7 F (37.1 C) 98 F (36.7 C)  TempSrc: Oral Oral Oral Oral  SpO2: 96% 96%  97%  Weight:  79.4 kg      Intake/Output Summary (Last 24 hours) at 07/04/2019 1124 Last data filed at 07/04/2019 0840 Gross per 24 hour  Intake 2045.09 ml  Output 1375 ml  Net 670.09 ml   Last 3 Weights 07/04/2019 07/03/2019 07/02/2019  Weight (lbs) 175 lb 1.6 oz 171 lb 15.3 oz 173 lb 15.1 oz  Weight (kg) 79.425 kg 78 kg 78.9 kg      Telemetry    Sinus  rhythm- Personally Reviewed  ECG    Sinus rhythm, negative T waves in the lateral leads, unchanged from prior EKG on April 05, 2019- Personally Reviewed  Physical Exam   GEN: No acute distress.   Neck: No JVD Cardiac: RRR, 3 out of 6 systolic murmurs, rubs, or gallops.  Respiratory: Clear to auscultation bilaterally. GI: Soft, nontender, non-distended  MS: No edema; No deformity. Neuro:  Nonfocal  Psych: Normal affect   Labs    High Sensitivity Troponin:   Recent Labs  Lab 06/27/19 0228 06/27/19 0318  TROPONINIHS 38* 47*      Chemistry Recent Labs  Lab 06/30/19 1714 07/03/19 0319  NA 139 140  K 4.4 4.3  CL 107 109  CO2 21* 24  GLUCOSE 131* 114*  BUN 36* 34*  CREATININE 1.40* 1.52*  CALCIUM 8.4* 7.7*  GFRNONAA 45* 41*  GFRAA 52* 47*  ANIONGAP 11 7     Hematology Recent Labs  Lab 06/30/19 1714 07/03/19 0319 07/04/19 0432  WBC 11.5* 10.3 10.3  RBC 3.47* 2.88* 2.96*  HGB 10.0* 8.2*  8.4*  HCT 29.5* 25.5* 26.2*  MCV 85.0 88.5 88.5  MCH 28.8 28.5 28.4  MCHC 33.9 32.2 32.1  RDW 14.0 14.6 14.4  PLT 305 275 274    BNPNo results for input(s): BNP, PROBNP in the last 168 hours.   DDimer No results for input(s): DDIMER in the last 168 hours.   Radiology    CARDIAC CATHETERIZATION  Addendum Date: 07/02/2019   Conclusions: 1. Severe single vessel coronary artery disease with 80-90% proximal LAD.  Lesion is suboptimally visualized due to difficult catheter engagement but appears to be heavily calcified. 2. Mild to moderate mid/distal LAD, LCx and RCA/rPDA disease. 3. Mildly elevated left ventricular filling pressure with hyperdynamic LV contraction. 4. Tortuous right subclavian/brachiocephalic artery.  Recommend alternate access for further catheterizations.  Recommendations: 1. Transfer to [facility] for continued monitoring and medical optimization.  Anticipate PCI to proximal LAD (potentially with atherectomy) on Monday via femoral approach. 2. Gentle  post-catheterization hydration given chronic kidney disease. 3. Aggressive secondary prevention, including improved blood pressure control. 4. Start IV heparin 2 hours after TR band removal. [name], MD [facility]   Result Date: 07/02/2019 Conclusions: 1. Severe single vessel coronary artery disease with 80-90% proximal LAD.  Lesion is suboptimally visualized due to difficult catheter engagement but appears to be heavily calcified. 2. Mild to moderate mid/distal LAD, LCx and RCA/rPDA disease. 3. Mildly elevated left ventricular filling pressure with hyperdynamic LV contraction. 4. Tortuous right subclavian/brachiocephalic artery.  Recommend alternate access for further catheterizations.  Recommendations: 1. Transfer to [facility] for continued monitoring and medical optimization.  Anticipate PCI to proximal LAD (potentially with atherectomy) on Monday via femoral approach. 2. Gentle post-catheterization hydration given chronic kidney disease. 3. Aggressive secondary prevention, including improved blood pressure control. 4. Start IV heparin 2 hours after TR band removal. [name], MD [facility]    Cardiac Studies   06/27/2019  1. Left ventricular ejection fraction, by estimation, is 60 to 65%. The  left ventricle has normal function. The left ventricle has no regional  wall motion abnormalities. There is moderate left ventricular hypertrophy.  Left ventricular diastolic  parameters are consistent with Grade I diastolic dysfunction (impaired  relaxation).  2. Right ventricular systolic function is normal. The right ventricular  size is normal.  3. Left atrial size was mildly dilated.  4. Tricuspid valve regurgitation is mild to moderate.    Patient Profile     86 y.o. male with known CAD, status post cardiac catheterization yesterday  Assessment & Plan    1.  CAD, NSTEMI, cath on 5/21 with findings of Severe single vessel coronary artery disease with 80-90%  proximal LAD.  Lesion is suboptimally visualized due to difficult catheter engagement but appears to be heavily calcified. 2. Mild to moderate mid/distal LAD, LCx and RCA/rPDA disease. 3. Mildly elevated left ventricular filling pressure with hyperdynamic LV contraction. 4. Tortuous right subclavian/brachiocephalic artery.  Recommend alternate access for further catheterizations.  Recommendations: 1. Transfer to [facility] for continued monitoring and medical optimization.  Anticipate PCI to proximal LAD (potentially with atherectomy) on Monday via femoral approach.   2.  CKD stage III, we will hydrate over the weekend.  LVEF is normal, patient has no signs of fluid overload. Crea is pending.  3.  Hypertension -increased Imdur to 60 mg daily.  Continue amlodipine and carvedilol and losartan.  For questions or updates, please contact [place] Please consult [email or web address] for contact info under  Signed, Ena Dawley, MD  07/04/2019, 11:24 AM

## 2019-07-04 NOTE — Progress Notes (Signed)
[facility] [place] for Heparin Indication: chest pain/ACS  Allergies  Allergen Reactions  . Penicillins Anaphylaxis    Reported airway compromise    Patient Measurements: Weight: 79.4 kg (175 lb 1.6 oz) Heparin Dosing Weight: 78.5 kg  Vital Signs: Temp: 98.7 F (37.1 C) (05/23 0826) Temp Source: Oral (05/23 0826) BP: 148/108 (05/23 0826) Pulse Rate: 60 (05/23 0826)  Labs: Recent Labs    07/03/19 0319 07/03/19 1304 07/04/19 0432  HGB 8.2*  --  8.4*  HCT 25.5*  --  26.2*  PLT 275  --  274  LABPROT 13.9  --   --   INR 1.1  --   --   HEPARINUNFRC 0.24* 0.61 0.55  CREATININE 1.52*  --   --     Estimated Creatinine Clearance: 35.4 mL/min (A) (by C-G formula based on SCr of 1.52 mg/dL (H)).  Assessment: 86 yo male with CAD awaiting PCI (planned for 5/24). Pharmacy consulted to dose heparin.  Heparin level this AM remains therapeutic at 0.55.  Hgb stable ~8, plts WNL. No bleeding noted per nursing.   Goal of Therapy:  Heparin level 0.3-0.7 units/ml Monitor platelets by anticoagulation protocol: Yes   Plan:  Continue heparin drip rate at 1050 units/hr Monitor daily heparin level and CBC Monitor for s/sx of bleeding   Kennon Holter, PharmD PGY1 Ambulatory Care Pharmacy Resident 07/04/2019,8:53 AM

## 2019-07-05 ENCOUNTER — Encounter (HOSPITAL_COMMUNITY)
Admission: AD | Disposition: A | Discharge status: Home / Self Care | Payer: Self-pay | Source: Other Acute Inpatient Hospital | Attending: Cardiology

## 2019-07-05 ENCOUNTER — Ambulatory Visit (HOSPITAL_COMMUNITY): Admit: 2019-07-05 | Payer: Medicare Other | Admitting: Internal Medicine

## 2019-07-05 ENCOUNTER — Encounter: Payer: Self-pay | Admitting: Cardiology

## 2019-07-05 DIAGNOSIS — I25110 Atherosclerotic heart disease of native coronary artery with unstable angina pectoris: Secondary | ICD-10-CM

## 2019-07-05 DIAGNOSIS — D638 Anemia in other chronic diseases classified elsewhere: Secondary | ICD-10-CM

## 2019-07-05 HISTORY — PX: CORONARY STENT INTERVENTION: CATH118234

## 2019-07-05 HISTORY — PX: INTRAVASCULAR ULTRASOUND/IVUS: CATH118244

## 2019-07-05 HISTORY — PX: CORONARY ATHERECTOMY: CATH118238

## 2019-07-05 LAB — BASIC METABOLIC PANEL
Anion gap: 4 — ABNORMAL LOW (ref 5–15)
BUN: 31 mg/dL — ABNORMAL HIGH (ref 8–23)
CO2: 22 mmol/L (ref 22–32)
Calcium: 7.8 mg/dL — ABNORMAL LOW (ref 8.9–10.3)
Chloride: 115 mmol/L — ABNORMAL HIGH (ref 98–111)
Creatinine, Ser: 1.59 mg/dL — ABNORMAL HIGH (ref 0.61–1.24)
GFR calc Af Amer: 45 mL/min — ABNORMAL LOW (ref >60)
GFR calc non Af Amer: 38 mL/min — ABNORMAL LOW (ref >60)
Glucose, Bld: 115 mg/dL — ABNORMAL HIGH (ref 70–99)
Potassium: 4.4 mmol/L (ref 3.5–5.1)
Sodium: 141 mmol/L (ref 135–145)

## 2019-07-05 LAB — CBC
HCT: 24.5 % — ABNORMAL LOW (ref 39.0–52.0)
HCT: 30.7 % — ABNORMAL LOW (ref 39.0–52.0)
Hemoglobin: 7.8 g/dL — ABNORMAL LOW (ref 13.0–17.0)
Hemoglobin: 10.1 g/dL — ABNORMAL LOW (ref 13.0–17.0)
MCH: 28.7 pg (ref 26.0–34.0)
MCH: 28.9 pg (ref 26.0–34.0)
MCHC: 31.8 g/dL (ref 30.0–36.0)
MCHC: 32.9 g/dL (ref 30.0–36.0)
MCV: 90.1 fL (ref 80.0–100.0)
MCV: 87.7 fL (ref 80.0–100.0)
Platelets: 278 10*3/uL (ref 150–400)
Platelets: 283 10*3/uL (ref 150–400)
RBC: 2.72 MIL/uL — ABNORMAL LOW (ref 4.22–5.81)
RBC: 3.5 MIL/uL — ABNORMAL LOW (ref 4.22–5.81)
RDW: 14.5 % (ref 11.5–15.5)
RDW: 14.3 % (ref 11.5–15.5)
WBC: 9.5 10*3/uL (ref 4.0–10.5)
WBC: 12.7 10*3/uL — ABNORMAL HIGH (ref 4.0–10.5)
nRBC: 0 % (ref 0.0–0.2)
nRBC: 0 % (ref 0.0–0.2)

## 2019-07-05 LAB — POCT ACTIVATED CLOTTING TIME
Activated Clotting Time: 241 seconds
Activated Clotting Time: 263 seconds
Activated Clotting Time: 296 seconds
Activated Clotting Time: 263 seconds
Activated Clotting Time: 246 seconds
Activated Clotting Time: 219 seconds
Activated Clotting Time: 197 seconds
Activated Clotting Time: 175 seconds

## 2019-07-05 LAB — POCT I-STAT, CHEM 8
BUN: 26 mg/dL — ABNORMAL HIGH (ref 8–23)
Calcium, Ion: 1.24 mmol/L (ref 1.15–1.40)
Chloride: 110 mmol/L (ref 98–111)
Creatinine, Ser: 1.5 mg/dL — ABNORMAL HIGH (ref 0.61–1.24)
Glucose, Bld: 81 mg/dL (ref 70–99)
HCT: 26 % — ABNORMAL LOW (ref 39.0–52.0)
Hemoglobin: 8.8 g/dL — ABNORMAL LOW (ref 13.0–17.0)
Potassium: 4.7 mmol/L (ref 3.5–5.1)
Sodium: 141 mmol/L (ref 135–145)
TCO2: 22 mmol/L (ref 22–32)

## 2019-07-05 LAB — GLUCOSE, CAPILLARY
Glucose-Capillary: 202 mg/dL — ABNORMAL HIGH (ref 70–99)
Glucose-Capillary: 92 mg/dL (ref 70–99)
Glucose-Capillary: 82 mg/dL (ref 70–99)
Glucose-Capillary: 72 mg/dL (ref 70–99)
Glucose-Capillary: 82 mg/dL (ref 70–99)
Glucose-Capillary: 75 mg/dL (ref 70–99)

## 2019-07-05 LAB — ABO/RH: ABO/RH(D): O POS

## 2019-07-05 LAB — PREPARE RBC (CROSSMATCH)

## 2019-07-05 LAB — SURGICAL PCR SCREEN
MRSA, PCR: NEGATIVE
Staphylococcus aureus: NEGATIVE

## 2019-07-05 LAB — CREATININE, SERUM
Creatinine, Ser: 1.36 mg/dL — ABNORMAL HIGH (ref 0.61–1.24)
GFR calc Af Amer: 54 mL/min — ABNORMAL LOW (ref >60)
GFR calc non Af Amer: 46 mL/min — ABNORMAL LOW (ref >60)

## 2019-07-05 LAB — HEPARIN LEVEL (UNFRACTIONATED): Heparin Unfractionated: 0.3 IU/mL (ref 0.30–0.70)

## 2019-07-05 SURGERY — CORONARY STENT INTERVENTION
Anesthesia: LOCAL

## 2019-07-05 MED ORDER — MUPIROCIN 2 % EX OINT: 1.0000 "application " | TOPICAL_OINTMENT | Freq: Two times a day (BID) | CUTANEOUS | Status: DC

## 2019-07-05 MED ORDER — LABETALOL HCL 5 MG/ML IV SOLN
10.0000 mg | INTRAVENOUS | Status: AC | PRN
  Administered 2019-07-05 (×2): 10 mg via INTRAVENOUS
  Filled 2019-07-05: qty 4

## 2019-07-05 MED ORDER — FENTANYL CITRATE (PF) 100 MCG/2ML IJ SOLN
INTRAMUSCULAR | Status: DC | PRN
  Administered 2019-07-05 (×2): 12.5 ug via INTRAVENOUS
  Administered 2019-07-05: 25 ug via INTRAVENOUS

## 2019-07-05 MED ORDER — IOHEXOL 350 MG/ML SOLN
INTRAVENOUS | Status: DC | PRN
  Administered 2019-07-05: 130 mL

## 2019-07-05 MED ORDER — FUROSEMIDE 10 MG/ML IJ SOLN
20.0000 mg | Freq: Once | INTRAMUSCULAR | Status: AC
  Administered 2019-07-05: 20 mg via INTRAVENOUS

## 2019-07-05 MED ORDER — HEPARIN SODIUM (PORCINE) 1000 UNIT/ML IJ SOLN
INTRAMUSCULAR | Status: AC
  Filled 2019-07-05: qty 1

## 2019-07-05 MED ORDER — NITROGLYCERIN 1 MG/10 ML FOR IR/CATH LAB
INTRA_ARTERIAL | Status: AC
  Filled 2019-07-05: qty 10

## 2019-07-05 MED ORDER — LIDOCAINE HCL (PF) 1 % IJ SOLN
INTRAMUSCULAR | Status: DC | PRN
  Administered 2019-07-05: 10 mL

## 2019-07-05 MED ORDER — LIDOCAINE HCL (PF) 1 % IJ SOLN
INTRAMUSCULAR | Status: AC
  Filled 2019-07-05: qty 30

## 2019-07-05 MED ORDER — SODIUM CHLORIDE 0.9 % IV SOLN: INTRAVENOUS | Status: DC

## 2019-07-05 MED ORDER — ATORVASTATIN CALCIUM 80 MG PO TABS
80.0000 mg | ORAL_TABLET | Freq: Every day | ORAL | Status: DC
  Administered 2019-07-06 – 2019-07-07 (×2): 80 mg via ORAL
  Filled 2019-07-05 (×2): qty 1

## 2019-07-05 MED ORDER — HYDRALAZINE HCL 20 MG/ML IJ SOLN
10.0000 mg | INTRAMUSCULAR | Status: AC | PRN
  Filled 2019-07-05: qty 1

## 2019-07-05 MED ORDER — CHLORHEXIDINE GLUCONATE CLOTH 2 % EX PADS
6.0000 | MEDICATED_PAD | Freq: Every day | CUTANEOUS | Status: DC
  Administered 2019-07-05 – 2019-07-07 (×2): 6 via TOPICAL

## 2019-07-05 MED ORDER — FUROSEMIDE 10 MG/ML IJ SOLN
INTRAMUSCULAR | Status: AC
  Filled 2019-07-05: qty 4

## 2019-07-05 MED ORDER — NITROGLYCERIN IN D5W 200-5 MCG/ML-% IV SOLN
2.0000 ug/min | INTRAVENOUS | Status: DC
  Administered 2019-07-05: 65 ug/min via INTRAVENOUS

## 2019-07-05 MED ORDER — SODIUM CHLORIDE 0.9% FLUSH
3.0000 mL | Freq: Two times a day (BID) | INTRAVENOUS | Status: DC
  Administered 2019-07-05 – 2019-07-07 (×3): 3 mL via INTRAVENOUS

## 2019-07-05 MED ORDER — MORPHINE SULFATE (PF) 2 MG/ML IV SOLN
INTRAVENOUS | Status: AC
  Filled 2019-07-05: qty 1

## 2019-07-05 MED ORDER — HYDRALAZINE HCL 20 MG/ML IJ SOLN
10.0000 mg | Freq: Four times a day (QID) | INTRAMUSCULAR | Status: DC | PRN
  Administered 2019-07-05: 10 mg via INTRAVENOUS
  Filled 2019-07-05: qty 1

## 2019-07-05 MED ORDER — HEPARIN SODIUM (PORCINE) 1000 UNIT/ML IJ SOLN
INTRAMUSCULAR | Status: DC | PRN
  Administered 2019-07-05 (×2): 2000 [IU] via INTRAVENOUS
  Administered 2019-07-05: 3000 [IU] via INTRAVENOUS
  Administered 2019-07-05: 7000 [IU] via INTRAVENOUS

## 2019-07-05 MED ORDER — VIPERSLIDE LUBRICANT OPTIME
TOPICAL | Status: DC | PRN
  Administered 2019-07-05: 20 mL via SURGICAL_CAVITY

## 2019-07-05 MED ORDER — HEPARIN (PORCINE) IN NACL 1000-0.9 UT/500ML-% IV SOLN
INTRAVENOUS | Status: AC
  Filled 2019-07-05: qty 1000

## 2019-07-05 MED ORDER — MIDAZOLAM HCL 2 MG/2ML IJ SOLN
INTRAMUSCULAR | Status: DC | PRN
  Administered 2019-07-05 (×2): 0.5 mg via INTRAVENOUS

## 2019-07-05 MED ORDER — SODIUM CHLORIDE 0.9 % IV SOLN: 250.0000 mL | INTRAVENOUS | Status: DC | PRN

## 2019-07-05 MED ORDER — LABETALOL HCL 5 MG/ML IV SOLN
INTRAVENOUS | Status: AC
  Filled 2019-07-05: qty 4

## 2019-07-05 MED ORDER — NITROGLYCERIN IN D5W 200-5 MCG/ML-% IV SOLN
INTRAVENOUS | Status: AC
  Filled 2019-07-05: qty 250

## 2019-07-05 MED ORDER — HYDRALAZINE HCL 25 MG PO TABS
25.0000 mg | ORAL_TABLET | Freq: Three times a day (TID) | ORAL | Status: DC
  Administered 2019-07-05 – 2019-07-06 (×3): 25 mg via ORAL
  Filled 2019-07-05 (×3): qty 1

## 2019-07-05 MED ORDER — MORPHINE SULFATE (PF) 2 MG/ML IV SOLN
2.0000 mg | Freq: Once | INTRAVENOUS | Status: AC
  Administered 2019-07-05: 2 mg via INTRAVENOUS

## 2019-07-05 MED ORDER — ATROPINE SULFATE 1 MG/10ML IJ SOSY
PREFILLED_SYRINGE | INTRAMUSCULAR | Status: AC
  Filled 2019-07-05: qty 10

## 2019-07-05 MED ORDER — FENTANYL CITRATE (PF) 100 MCG/2ML IJ SOLN
INTRAMUSCULAR | Status: AC
  Filled 2019-07-05: qty 2

## 2019-07-05 MED ORDER — SODIUM CHLORIDE 0.9% FLUSH: 3.0000 mL | INTRAVENOUS | Status: DC | PRN

## 2019-07-05 MED ORDER — MIDAZOLAM HCL 2 MG/2ML IJ SOLN
INTRAMUSCULAR | Status: AC
  Filled 2019-07-05: qty 2

## 2019-07-05 MED ORDER — HEPARIN (PORCINE) IN NACL 1000-0.9 UT/500ML-% IV SOLN
INTRAVENOUS | Status: DC | PRN
  Administered 2019-07-05 (×2): 500 mL

## 2019-07-05 MED ORDER — HEPARIN SODIUM (PORCINE) 5000 UNIT/ML IJ SOLN
5000.0000 [IU] | Freq: Three times a day (TID) | INTRAMUSCULAR | Status: DC
  Administered 2019-07-06 – 2019-07-07 (×4): 5000 [IU] via SUBCUTANEOUS
  Filled 2019-07-05 (×4): qty 1

## 2019-07-05 MED ORDER — SODIUM CHLORIDE 0.9% IV SOLUTION: Freq: Once | INTRAVENOUS | Status: AC

## 2019-07-05 MED ORDER — NITROGLYCERIN 1 MG/10 ML FOR IR/CATH LAB
INTRA_ARTERIAL | Status: DC | PRN
  Administered 2019-07-05 (×4): 200 ug via INTRACORONARY

## 2019-07-05 MED ORDER — NITROGLYCERIN IN D5W 200-5 MCG/ML-% IV SOLN
INTRAVENOUS | Status: AC | PRN
  Administered 2019-07-05: 10 ug/min via INTRAVENOUS

## 2019-07-05 MED ORDER — ONDANSETRON HCL 4 MG/2ML IJ SOLN
INTRAMUSCULAR | Status: AC
  Filled 2019-07-05: qty 2

## 2019-07-05 SURGICAL SUPPLY — 26 items
BALLN SAPPHIRE 2.5X20 (BALLOONS) ×2
BALLN SAPPHIRE ~~LOC~~ 3.25X18 (BALLOONS) ×1 IMPLANT
BALLN SAPPHIRE ~~LOC~~ 3.75X12 (BALLOONS) ×1 IMPLANT
BALLOON SAPPHIRE 2.5X20 (BALLOONS) IMPLANT
CATH TELEPORT (CATHETERS) ×1 IMPLANT
CATH ULTRASOUND OPTICROSS 6FR (CATHETERS) ×1 IMPLANT
CATH VISTA GUIDE 6FR XBLAD3.5 (CATHETERS) ×1 IMPLANT
CROWN DIAMONDBACK CLASSIC 1.25 (BURR) ×1 IMPLANT
ELECT DEFIB PAD ADLT CADENCE (PAD) ×1 IMPLANT
GUIDEWIRE SAF TJ AMPL .035X180 (WIRE) ×1 IMPLANT
KIT ENCORE 26 ADVANTAGE (KITS) ×1 IMPLANT
KIT HEART LEFT (KITS) ×2 IMPLANT
KIT HEMO VALVE WATCHDOG (MISCELLANEOUS) ×1 IMPLANT
KIT MICROINTRODUCER 5F 7206 (SHEATH) ×1 IMPLANT
LUBRICANT VIPERSLIDE CORONARY (MISCELLANEOUS) ×1 IMPLANT
PACK CARDIAC CATHETERIZATION (CUSTOM PROCEDURE TRAY) ×2 IMPLANT
SHEATH PINNACLE 6F 10CM (SHEATH) ×1 IMPLANT
SLED PULL BACK IVUS (MISCELLANEOUS) ×1 IMPLANT
STENT SYNERGY XD 3.0X48 (Permanent Stent) IMPLANT
SYNERGY XD 3.0X48 (Permanent Stent) ×2 IMPLANT
TRANSDUCER W/STOPCOCK (MISCELLANEOUS) ×2 IMPLANT
TUBING CIL FLEX 10 FLL-RA (TUBING) ×2 IMPLANT
WIRE EMERALD 3MM-J .035X150CM (WIRE) ×1 IMPLANT
WIRE RUNTHROUGH .014X180CM (WIRE) ×1 IMPLANT
WIRE RUNTHROUGH EXTENSION (WIRE) ×1 IMPLANT
WIRE VIPERWIRE COR FLEX .012 (WIRE) ×1 IMPLANT

## 2019-07-05 NOTE — H&P (View-Only) (Signed)
Progress Note  Patient Name: Peter [name] Date of Encounter: 07/05/2019  Primary Cardiologist: [name], MD  Subjective   Had loose bowel which cleared up. No chest pain or dyspnea.   Inpatient Medications    Scheduled Meds: . sodium chloride   Intravenous Once  . allopurinol  100 mg Oral Daily  . amLODipine  10 mg Oral Daily  . vitamin C  1,000 mg Oral Daily  . aspirin EC  81 mg Oral Daily  . atorvastatin  40 mg Oral Daily  . carvedilol  6.25 mg Oral BID WC  . clopidogrel  75 mg Oral Daily  . donepezil  10 mg Oral QHS  . doxazosin  4 mg Oral QHS  . ferrous sulfate  325 mg Oral Daily  . finasteride  5 mg Oral Daily  . folic acid  0.5 mg Oral Once per day on Mon Thu  . gabapentin  300 mg Oral BID  . insulin aspart  0-15 Units Subcutaneous TID WC  . insulin aspart  0-5 Units Subcutaneous QHS  . insulin glargine  20 Units Subcutaneous QHS  . isosorbide mononitrate  60 mg Oral Daily  . losartan  100 mg Oral Daily  . memantine  10 mg Oral BID  . selenium  50 mcg Oral Once per day on Mon Thu  . sertraline  25 mg Oral Daily  . sodium chloride flush  3 mL Intravenous Q12H   Continuous Infusions: . sodium chloride Stopped (07/05/19 0441)  . sodium chloride    . sodium chloride 1 mL/kg/hr (07/05/19 0730)  . heparin 1,050 Units/hr (07/05/19 0439)   PRN Meds: sodium chloride, acetaminophen, nitroGLYCERIN, ondansetron (ZOFRAN) IV, sodium chloride flush   Vital Signs    Vitals:   07/05/19 0454 07/05/19 0748 07/05/19 0942 07/05/19 0945  BP: (!) 154/48 (!) 188/60 (!) 172/61 (!) 160/51  Pulse: (!) 55 60 (!) 56 (!) 56  Resp: 16 14 15 15   Temp:  (!) 97.4 F (36.3 C) 98.1 F (36.7 C) 98.1 F (36.7 C)  TempSrc:  Oral Oral Oral  SpO2: 93% 98% 99% 99%  Weight: 80.9 kg       Intake/Output Summary (Last 24 hours) at 07/05/2019 1000 Last data filed at 07/05/2019 0920 Gross per 24 hour  Intake 2144.01 ml  Output 626 ml  Net 1518.01 ml   Last 3 Weights 07/05/2019  07/04/2019 07/03/2019  Weight (lbs) 178 lb 6.4 oz 175 lb 1.6 oz 171 lb 15.3 oz  Weight (kg) 80.922 kg 79.425 kg 78 kg      Telemetry   NSR - Personally Reviewed  ECG    N/A  Physical Exam   GEN: No acute distress.   Neck: No JVD Cardiac: RRR, no murmurs, rubs, or gallops.  Respiratory: Clear to auscultation bilaterally. GI: Soft, nontender, non-distended  MS: No edema; No deformity. Neuro:  Nonfocal  Psych: Normal affect   Labs    High Sensitivity Troponin:   Recent Labs  Lab 06/27/19 0228 06/27/19 0318  TROPONINIHS 38* 47*      Chemistry Recent Labs  Lab 06/30/19 1714 07/03/19 0319 07/05/19 0451  NA 139 140 141  K 4.4 4.3 4.4  CL 107 109 115*  CO2 21* 24 22  GLUCOSE 131* 114* 115*  BUN 36* 34* 31*  CREATININE 1.40* 1.52* 1.59*  CALCIUM 8.4* 7.7* 7.8*  GFRNONAA 45* 41* 38*  GFRAA 52* 47* 45*  ANIONGAP 11 7 4*     Hematology Recent Labs  Lab 07/03/19 0319 07/04/19 0432 07/05/19 0308  WBC 10.3 10.3 9.5  RBC 2.88* 2.96* 2.72*  HGB 8.2* 8.4* 7.8*  HCT 25.5* 26.2* 24.5*  MCV 88.5 88.5 90.1  MCH 28.5 28.4 28.7  MCHC 32.2 32.1 31.8  RDW 14.6 14.4 14.5  PLT 275 274 278    Radiology    No results found.  Cardiac Studies   LEFT HEART CATH AND CORONARY ANGIOGRAPHY  Conclusion  Conclusions: 1. Severe single vessel coronary artery disease with 80-90% proximal LAD. Lesion is suboptimally visualized due to difficult catheter engagement but appears to be heavily calcified. 2. Mild to moderate mid/distal LAD, LCx and RCA/rPDA disease. 3. Mildly elevated left ventricular filling pressure with hyperdynamic LV contraction. 4. Tortuous right subclavian/brachiocephalic artery.  Recommend alternate access for further catheterizations.  Recommendations: 1. Transfer to [facility] for continued monitoring and medical optimization. Anticipate PCI to proximal LAD (potentially with atherectomy) on Monday via femoral approach. 2. Gentle post-catheterization  hydration given chronic kidney disease. 3. Aggressive secondary prevention, including improved blood pressure control. 4. Start IV heparin 2 hours after TR band removal.    Diagnostic Dominance: Co-dominant  Intervention   Echo 06/27/19 1. Left ventricular ejection fraction, by estimation, is 60 to 65%. The  left ventricle has normal function. The left ventricle has no regional  wall motion abnormalities. There is moderate left ventricular hypertrophy.  Left ventricular diastolic  parameters are consistent with Grade I diastolic dysfunction (impaired  relaxation).  2. Right ventricular systolic function is normal. The right ventricular  size is normal.  3. Left atrial size was mildly dilated.  4. Tricuspid valve regurgitation is mild to moderate.   Patient Profile     86 y.o. male with CAD status post PCI to the RCA in 03/2018, PVD, DM 2, HTN, HLD, and prostate cancer who presented to [facility] for outpatient diagnostic LHC and found to severe stenosis in pLAD and transferred to [facility] of arterectomy.   Assessment & Plan    1. CAD - Cath as above showing severe single vessel coronary artery disease with 80-90% proximal LAD.  Lesion is suboptimally visualized due to difficult catheter engagement but appears to be heavily calcified.  - For atherectomy later today via femoral approach -Continue aspirin, Plavix, statin and beta-blocker  2.  Hypertension -Blood pressure is elevated despite on multiple antihypertensive regimen -Cannot uptitrate beta-blocker secondary to baseline bradycardia -Add hydralazine  3.  Hyperlipidemia - 06/27/2019: Cholesterol 135; HDL 61; LDL Cholesterol 62; Triglycerides 62; VLDL 12  - On Lipitor 40mg  qd >> increase to 80mg  qd given significant CAD  4. DM - A1c 6.7 - On SSI  5. Anemia - Hgb 10>>8.2>>8.4>>7.8  (could be dilutional as he was given hydration over the weekend) -On dual antiplatelet therapy prior to presenting for cath -Reported slight dark  stool which has cleared of>> diarrhea improving -Confuse 1 unit of blood prior to PCI today  6. CKD III -Renal function stable at 1.5  For questions or updates, please contact [place] Please consult [email or web address] for contact info under        Signed, Leanor Kail, PA  07/05/2019, 10:00 AM

## 2019-07-05 NOTE — Interval H&P Note (Signed)
History and Physical Interval Note:  07/05/2019 3:12 PM  Peter [name]  has presented today for surgery, with the diagnosis of unstable angina.  The various methods of treatment have been discussed with the patient and family. After consideration of risks, benefits and other options for treatment, the patient has consented to  Procedure(s): CORONARY STENT INTERVENTION (N/A) as a surgical intervention.  The patient's history has been reviewed, patient examined, no change in status, stable for surgery.  I have reviewed the patient's chart and labs.  Questions were answered to the patient's satisfaction.    Cath Lab Visit (complete for each Cath Lab visit)  Clinical Evaluation Leading to the Procedure:   ACS: Yes.  (unstable angina)  Non-ACS:  N/A  Christopher End

## 2019-07-05 NOTE — Progress Notes (Signed)
[facility] for Heparin Indication: chest pain/ACS  Allergies  Allergen Reactions  . Penicillins Anaphylaxis    Reported airway compromise    Patient Measurements: Weight: 80.9 kg (178 lb 6.4 oz) Heparin Dosing Weight: 78.5 kg  Vital Signs: Temp: 98.1 F (36.7 C) (05/24 0945) Temp Source: Oral (05/24 0945) BP: 160/51 (05/24 0945) Pulse Rate: 56 (05/24 0945)  Labs: Recent Labs    07/03/19 0319 07/03/19 0319 07/03/19 1304 07/04/19 0432 07/05/19 0308 07/05/19 0451  HGB 8.2*   < >  --  8.4* 7.8*  --   HCT 25.5*  --   --  26.2* 24.5*  --   PLT 275  --   --  274 278  --   LABPROT 13.9  --   --   --   --   --   INR 1.1  --   --   --   --   --   HEPARINUNFRC 0.24*   < > 0.61 0.55 0.30  --   CREATININE 1.52*  --   --   --   --  1.59*   < > = values in this interval not displayed.    Estimated Creatinine Clearance: 33.8 mL/min (A) (by C-G formula based on SCr of 1.59 mg/dL (H)).  Assessment: 86 yo male with CAD awaiting PCI (planned for 5/24). Pharmacy consulted to dose heparin.   Heparin level this AM remains therapeutic at 0.3    Goal of Therapy:  Heparin level 0.3-0.7 units/ml Monitor platelets by anticoagulation protocol: Yes   Plan:  Continue heparin drip rate at 1050 units/hr Will follow plans post cath  Hildred Laser, PharmD Clinical Pharmacist **Pharmacist phone directory can now be found on [email or web address] ([email or web address]).  Listed under [place].

## 2019-07-05 NOTE — Telephone Encounter (Signed)
Discussed with [name] this morning by phone.  I will follow-up with her and Mr. Bommer wife following procedure this afternoon.  [name], MD [facility]

## 2019-07-05 NOTE — TOC Benefit Eligibility Note (Signed)
Transition of Care [facility]) Benefit Eligibility Note    Patient Details  Name: Peter [name] MRN: [id number] Date of Birth: [birth year]   Medication/Dose: VASCEPA   2GM BID  Covered?: Yes  Tier: 2 Drug  Prescription Coverage [facility] with Person/Company/Phone Number:: LAURIE  @ [employer] # [id number]  Co-Pay: $49.63  Prior Approval: No  Deductible: (NO DEDUCTIBLE WITH PLAN)  Additional Notes: 90 DAY SUPPLY FOR M/O $90.00    Memory Argue Phone Number: 07/05/2019, 12:55 PM

## 2019-07-05 NOTE — Progress Notes (Signed)
Plan of care reviewed. Pt's hemodynamically stable. Remained afebrile. Denied chest pain. NSR and SB on monitor. HR 50s-60s, BP 154/48 mmHg. SPO2 96-99% on room air. No incidents of immediate distress noted tonight. Continue to monitor.  Kennyth Lose, RN

## 2019-07-05 NOTE — Progress Notes (Signed)
Dr. Saunders Revel at bedside. Repeat EKG done per order. MSO4 given; nauseated; Zofran given, nausea subsided. Left groin level 0

## 2019-07-05 NOTE — Progress Notes (Signed)
Patient's arterial sheath was removed and pressure held until hemostasis for 16 minutes. Distal pulses on extremity present through out the procedure. Patient educated on bleeding complications.

## 2019-07-05 NOTE — Progress Notes (Signed)
[facility] Post-PCI Note:  Date: 07/05/19 Time: 6:12 PM  S: I was alerted by the recovery RN that the patient remained severely hypertensive (SBP near 200 mmHg) with continued 6/10 chest pressure.  Pt reports symptoms have improved minimally since Peter [name] of the cath procedure despite being on IV NTG at 30 mcg/min and receiving labetalol 10 mg IV x 1.  He also notes mild shortness of breath.  O: Temp:  [97.4 F (36.3 C)-98.2 F (36.8 C)] 98.2 F (36.8 C) (05/24 1310) Pulse Rate:  [51-92] 58 (05/24 1800) Resp:  [7-54] 17 (05/24 1800) BP: (133-215)/(48-104) 183/62 (05/24 1800) SpO2:  [0 %-100 %] 94 % (05/24 1800) Weight:  [80.9 kg] 80.9 kg (05/24 0454) Gen: Mildly uncomfortable appearing man. Heart: RRR w/o murmurs. Lungs: Clear anteriorly.  Bedside echo: Normal LVEF.  No pericardial effusion.  EKG: NSR with nonspecific ST/T changes.  A/P: I suspect CP is likely due to transient ischemia from PCI.  There may be some element of microvascular dysfunction from CSI as well.  Marked hypertension is likely contributing as well as elevated LVEDP from IVF and PRBC transfusion given today.  IV NTG should be titrated as BP allows for relief of chest pain.  I will give furosemide 20 mg IV x 1.  Plan to transfer to 2H-ICU for ongoing management.  [name], MD [facility]

## 2019-07-05 NOTE — Progress Notes (Signed)
Progress Note  Patient Name: Peter [name] Date of Encounter: 07/05/2019  Primary Cardiologist: [name], MD  Subjective   Had loose bowel which cleared up. No chest pain or dyspnea.   Inpatient Medications    Scheduled Meds: . sodium chloride   Intravenous Once  . allopurinol  100 mg Oral Daily  . amLODipine  10 mg Oral Daily  . vitamin C  1,000 mg Oral Daily  . aspirin EC  81 mg Oral Daily  . atorvastatin  40 mg Oral Daily  . carvedilol  6.25 mg Oral BID WC  . clopidogrel  75 mg Oral Daily  . donepezil  10 mg Oral QHS  . doxazosin  4 mg Oral QHS  . ferrous sulfate  325 mg Oral Daily  . finasteride  5 mg Oral Daily  . folic acid  0.5 mg Oral Once per day on Mon Thu  . gabapentin  300 mg Oral BID  . insulin aspart  0-15 Units Subcutaneous TID WC  . insulin aspart  0-5 Units Subcutaneous QHS  . insulin glargine  20 Units Subcutaneous QHS  . isosorbide mononitrate  60 mg Oral Daily  . losartan  100 mg Oral Daily  . memantine  10 mg Oral BID  . selenium  50 mcg Oral Once per day on Mon Thu  . sertraline  25 mg Oral Daily  . sodium chloride flush  3 mL Intravenous Q12H   Continuous Infusions: . sodium chloride Stopped (07/05/19 0441)  . sodium chloride    . sodium chloride 1 mL/kg/hr (07/05/19 0730)  . heparin 1,050 Units/hr (07/05/19 0439)   PRN Meds: sodium chloride, acetaminophen, nitroGLYCERIN, ondansetron (ZOFRAN) IV, sodium chloride flush   Vital Signs    Vitals:   07/05/19 0454 07/05/19 0748 07/05/19 0942 07/05/19 0945  BP: (!) 154/48 (!) 188/60 (!) 172/61 (!) 160/51  Pulse: (!) 55 60 (!) 56 (!) 56  Resp: 16 14 15 15   Temp:  (!) 97.4 F (36.3 C) 98.1 F (36.7 C) 98.1 F (36.7 C)  TempSrc:  Oral Oral Oral  SpO2: 93% 98% 99% 99%  Weight: 80.9 kg       Intake/Output Summary (Last 24 hours) at 07/05/2019 1000 Last data filed at 07/05/2019 0920 Gross per 24 hour  Intake 2144.01 ml  Output 626 ml  Net 1518.01 ml   Last 3 Weights 07/05/2019  07/04/2019 07/03/2019  Weight (lbs) 178 lb 6.4 oz 175 lb 1.6 oz 171 lb 15.3 oz  Weight (kg) 80.922 kg 79.425 kg 78 kg      Telemetry   NSR - Personally Reviewed  ECG    N/A  Physical Exam   GEN: No acute distress.   Neck: No JVD Cardiac: RRR, no murmurs, rubs, or gallops.  Respiratory: Clear to auscultation bilaterally. GI: Soft, nontender, non-distended  MS: No edema; No deformity. Neuro:  Nonfocal  Psych: Normal affect   Labs    High Sensitivity Troponin:   Recent Labs  Lab 06/27/19 0228 06/27/19 0318  TROPONINIHS 38* 47*      Chemistry Recent Labs  Lab 06/30/19 1714 07/03/19 0319 07/05/19 0451  NA 139 140 141  K 4.4 4.3 4.4  CL 107 109 115*  CO2 21* 24 22  GLUCOSE 131* 114* 115*  BUN 36* 34* 31*  CREATININE 1.40* 1.52* 1.59*  CALCIUM 8.4* 7.7* 7.8*  GFRNONAA 45* 41* 38*  GFRAA 52* 47* 45*  ANIONGAP 11 7 4*     Hematology Recent Labs  Lab 07/03/19 0319 07/04/19 0432 07/05/19 0308  WBC 10.3 10.3 9.5  RBC 2.88* 2.96* 2.72*  HGB 8.2* 8.4* 7.8*  HCT 25.5* 26.2* 24.5*  MCV 88.5 88.5 90.1  MCH 28.5 28.4 28.7  MCHC 32.2 32.1 31.8  RDW 14.6 14.4 14.5  PLT 275 274 278    Radiology    No results found.  Cardiac Studies   LEFT HEART CATH AND CORONARY ANGIOGRAPHY  Conclusion  Conclusions: 1. Severe single vessel coronary artery disease with 80-90% proximal LAD. Lesion is suboptimally visualized due to difficult catheter engagement but appears to be heavily calcified. 2. Mild to moderate mid/distal LAD, LCx and RCA/rPDA disease. 3. Mildly elevated left ventricular filling pressure with hyperdynamic LV contraction. 4. Tortuous right subclavian/brachiocephalic artery.  Recommend alternate access for further catheterizations.  Recommendations: 1. Transfer to [facility] for continued monitoring and medical optimization. Anticipate PCI to proximal LAD (potentially with atherectomy) on Monday via femoral approach. 2. Gentle post-catheterization  hydration given chronic kidney disease. 3. Aggressive secondary prevention, including improved blood pressure control. 4. Start IV heparin 2 hours after TR band removal.    Diagnostic Dominance: Co-dominant  Intervention   Echo 06/27/19 1. Left ventricular ejection fraction, by estimation, is 60 to 65%. The  left ventricle has normal function. The left ventricle has no regional  wall motion abnormalities. There is moderate left ventricular hypertrophy.  Left ventricular diastolic  parameters are consistent with Grade I diastolic dysfunction (impaired  relaxation).  2. Right ventricular systolic function is normal. The right ventricular  size is normal.  3. Left atrial size was mildly dilated.  4. Tricuspid valve regurgitation is mild to moderate.   Patient Profile     86 y.o. male with CAD status post PCI to the RCA in 03/2018, PVD, DM 2, HTN, HLD, and prostate cancer who presented to [facility] for outpatient diagnostic LHC and found to severe stenosis in pLAD and transferred to [facility] of arterectomy.   Assessment & Plan    1. CAD - Cath as above showing severe single vessel coronary artery disease with 80-90% proximal LAD.  Lesion is suboptimally visualized due to difficult catheter engagement but appears to be heavily calcified.  - For atherectomy later today via femoral approach -Continue aspirin, Plavix, statin and beta-blocker  2.  Hypertension -Blood pressure is elevated despite on multiple antihypertensive regimen -Cannot uptitrate beta-blocker secondary to baseline bradycardia -Add hydralazine  3.  Hyperlipidemia - 06/27/2019: Cholesterol 135; HDL 61; LDL Cholesterol 62; Triglycerides 62; VLDL 12  - On Lipitor 40mg  qd >> increase to 80mg  qd given significant CAD  4. DM - A1c 6.7 - On SSI  5. Anemia - Hgb 10>>8.2>>8.4>>7.8  (could be dilutional as he was given hydration over the weekend) -On dual antiplatelet therapy prior to presenting for cath -Reported slight dark  stool which has cleared of>> diarrhea improving -Confuse 1 unit of blood prior to PCI today  6. CKD III -Renal function stable at 1.5  For questions or updates, please contact [place] Please consult [email or web address] for contact info under        Signed, Leanor Kail, PA  07/05/2019, 10:00 AM

## 2019-07-06 DIAGNOSIS — D509 Iron deficiency anemia, unspecified: Secondary | ICD-10-CM

## 2019-07-06 LAB — TYPE AND SCREEN
ABO/RH(D): O POS
Antibody Screen: NEGATIVE
Unit division: 0

## 2019-07-06 LAB — CBC
HCT: 28.3 % — ABNORMAL LOW (ref 39.0–52.0)
Hemoglobin: 9.2 g/dL — ABNORMAL LOW (ref 13.0–17.0)
MCH: 28.6 pg (ref 26.0–34.0)
MCHC: 32.5 g/dL (ref 30.0–36.0)
MCV: 87.9 fL (ref 80.0–100.0)
Platelets: 267 10*3/uL (ref 150–400)
RBC: 3.22 MIL/uL — ABNORMAL LOW (ref 4.22–5.81)
RDW: 14.4 % (ref 11.5–15.5)
WBC: 10.9 10*3/uL — ABNORMAL HIGH (ref 4.0–10.5)
nRBC: 0 % (ref 0.0–0.2)

## 2019-07-06 LAB — RETICULOCYTES
Immature Retic Fract: 6.9 % (ref 2.3–15.9)
RBC.: 3.24 MIL/uL — ABNORMAL LOW (ref 4.22–5.81)
Retic Count, Absolute: 71.3 10*3/uL (ref 19.0–186.0)
Retic Ct Pct: 2.2 % (ref 0.4–3.1)

## 2019-07-06 LAB — BASIC METABOLIC PANEL
Anion gap: 10 (ref 5–15)
BUN: 29 mg/dL — ABNORMAL HIGH (ref 8–23)
CO2: 20 mmol/L — ABNORMAL LOW (ref 22–32)
Calcium: 8.3 mg/dL — ABNORMAL LOW (ref 8.9–10.3)
Chloride: 110 mmol/L (ref 98–111)
Creatinine, Ser: 1.58 mg/dL — ABNORMAL HIGH (ref 0.61–1.24)
GFR calc Af Amer: 45 mL/min — ABNORMAL LOW (ref >60)
GFR calc non Af Amer: 39 mL/min — ABNORMAL LOW (ref >60)
Glucose, Bld: 87 mg/dL (ref 70–99)
Potassium: 4.7 mmol/L (ref 3.5–5.1)
Sodium: 140 mmol/L (ref 135–145)

## 2019-07-06 LAB — IRON AND TIBC
Iron: 43 ug/dL — ABNORMAL LOW (ref 45–182)
Saturation Ratios: 17 % — ABNORMAL LOW (ref 17.9–39.5)
TIBC: 251 ug/dL (ref 250–450)
UIBC: 208 ug/dL

## 2019-07-06 LAB — BPAM RBC
Blood Product Expiration Date: 202106152359
ISSUE DATE / TIME: 202105240951
Unit Type and Rh: 5100

## 2019-07-06 LAB — GLUCOSE, CAPILLARY
Glucose-Capillary: 82 mg/dL (ref 70–99)
Glucose-Capillary: 110 mg/dL — ABNORMAL HIGH (ref 70–99)
Glucose-Capillary: 250 mg/dL — ABNORMAL HIGH (ref 70–99)

## 2019-07-06 LAB — POCT ACTIVATED CLOTTING TIME: Activated Clotting Time: 164 seconds

## 2019-07-06 LAB — FERRITIN: Ferritin: 51 ng/mL (ref 24–336)

## 2019-07-06 LAB — VITAMIN B12: Vitamin B-12: 373 pg/mL (ref 180–914)

## 2019-07-06 LAB — FOLATE: Folate: 18.1 ng/mL (ref >5.9)

## 2019-07-06 MED ORDER — HYDRALAZINE HCL 25 MG PO TABS
25.0000 mg | ORAL_TABLET | Freq: Once | ORAL | Status: AC
  Administered 2019-07-06: 25 mg via ORAL
  Filled 2019-07-06: qty 1

## 2019-07-06 MED ORDER — HYDRALAZINE HCL 50 MG PO TABS
50.0000 mg | ORAL_TABLET | Freq: Three times a day (TID) | ORAL | Status: DC
  Administered 2019-07-06 – 2019-07-07 (×3): 50 mg via ORAL
  Filled 2019-07-06 (×3): qty 1

## 2019-07-06 MED ORDER — SODIUM CHLORIDE 0.9 % IV SOLN
510.0000 mg | Freq: Once | INTRAVENOUS | Status: AC
  Administered 2019-07-06: 510 mg via INTRAVENOUS
  Filled 2019-07-06: qty 17

## 2019-07-06 NOTE — Progress Notes (Addendum)
Pt arrived from [facility]. A/OX4. Pt denies complaints. CHG bath given. Telebox 7 applied and CCMD notified. Vitasl stable. Pt oriented to room and call bell within reach. Will continue to monitor.  Jerald Kief, RN

## 2019-07-06 NOTE — Progress Notes (Signed)
Progress Note  Patient Name: Peter [name] Date of Encounter: 07/06/2019  Primary Cardiologist: [name], MD  Subjective   Feeling better this am. Had extended chest pain following procedure. Better this am but low grade discomfort persists. Off IV Ntg. No groin pain.   Inpatient Medications    Scheduled Meds: . allopurinol  100 mg Oral Daily  . amLODipine  10 mg Oral Daily  . vitamin C  1,000 mg Oral Daily  . aspirin EC  81 mg Oral Daily  . atorvastatin  80 mg Oral Daily  . carvedilol  6.25 mg Oral BID WC  . Chlorhexidine Gluconate Cloth  6 each Topical Daily  . clopidogrel  75 mg Oral Daily  . donepezil  10 mg Oral QHS  . doxazosin  4 mg Oral QHS  . ferrous sulfate  325 mg Oral Daily  . finasteride  5 mg Oral Daily  . folic acid  0.5 mg Oral Once per day on Mon Thu  . gabapentin  300 mg Oral BID  . heparin  5,000 Units Subcutaneous Q8H  . hydrALAZINE  25 mg Oral Once  . hydrALAZINE  50 mg Oral Q8H  . insulin aspart  0-15 Units Subcutaneous TID WC  . insulin aspart  0-5 Units Subcutaneous QHS  . insulin glargine  20 Units Subcutaneous QHS  . isosorbide mononitrate  60 mg Oral Daily  . losartan  100 mg Oral Daily  . memantine  10 mg Oral BID  . sertraline  25 mg Oral Daily  . sodium chloride flush  3 mL Intravenous Q12H   Continuous Infusions: . sodium chloride    . ferumoxytol    . nitroGLYCERIN 65 mcg/min (07/05/19 2115)   PRN Meds: sodium chloride, acetaminophen, nitroGLYCERIN, ondansetron (ZOFRAN) IV, sodium chloride flush   Vital Signs    Vitals:   07/06/19 0600 07/06/19 0644 07/06/19 0700 07/06/19 0745  BP: (!) 151/57 (!) 180/62 (!) 184/64 (!) 188/60  Pulse: 71  75 68  Resp: 17  17 16   Temp:    98.5 F (36.9 C)  TempSrc:    Oral  SpO2: 98%  95% 99%  Weight:        Intake/Output Summary (Last 24 hours) at 07/06/2019 0800 Last data filed at 07/06/2019 0751 Gross per 24 hour  Intake 1867.8 ml  Output 1915 ml  Net -47.2 ml   Last 3  Weights 07/06/2019 07/05/2019 07/04/2019  Weight (lbs) 177 lb 14.6 oz 178 lb 6.4 oz 175 lb 1.6 oz  Weight (kg) 80.7 kg 80.922 kg 79.425 kg      Telemetry   NSR with PVCs- Personally Reviewed  ECG    Today: NSR with lateral T wave inversion. Unchanged.   Physical Exam   GEN: No acute distress.   Neck: No JVD Cardiac: RRR, no murmurs, rubs, or gallops.  Respiratory: Clear to auscultation bilaterally. GI: Soft, nontender, non-distended  MS: No edema; No deformity. Left groin without hematoma. Neuro:  Nonfocal  Psych: Normal affect   Labs    High Sensitivity Troponin:   Recent Labs  Lab 06/27/19 0228 06/27/19 0318  TROPONINIHS 38* 47*      Chemistry Recent Labs  Lab 07/03/19 0319 07/03/19 0319 07/05/19 0451 07/05/19 0451 07/05/19 1537 07/05/19 2029 07/06/19 0247  NA 140   < > 141  --  141  --  140  K 4.3   < > 4.4  --  4.7  --  4.7  CL 109   < >  115*  --  110  --  110  CO2 24  --  22  --   --   --  20*  GLUCOSE 114*   < > 115*  --  81  --  87  BUN 34*   < > 31*  --  26*  --  29*  CREATININE 1.52*   < > 1.59*   < > 1.50* 1.36* 1.58*  CALCIUM 7.7*  --  7.8*  --   --   --  8.3*  GFRNONAA 41*   < > 38*  --   --  46* 39*  GFRAA 47*   < > 45*  --   --  54* 45*  ANIONGAP 7  --  4*  --   --   --  10   < > = values in this interval not displayed.     Hematology Recent Labs  Lab 07/05/19 0308 07/05/19 0308 07/05/19 1537 07/05/19 2029 07/06/19 0247  WBC 9.5  --   --  12.7* 10.9*  RBC 2.72*   < >  --  3.50* 3.22*  3.24*  HGB 7.8*   < > 8.8* 10.1* 9.2*  HCT 24.5*   < > 26.0* 30.7* 28.3*  MCV 90.1  --   --  87.7 87.9  MCH 28.7  --   --  28.9 28.6  MCHC 31.8  --   --  32.9 32.5  RDW 14.5  --   --  14.3 14.4  PLT 278  --   --  283 267   < > = values in this interval not displayed.    Radiology    CARDIAC CATHETERIZATION  Result Date: 07/05/2019 Conclusions: 1. Severely diseased proximal and mid LAD with multifocal stenoses of up to 80-90% and heavy  calcification. 2. Successful IVUS-guided orbital atherectomy and PCI of the ostial through mid LAD using a Synergy 3.0 x 48 mm drug-eluting stent (postdilated up to 3.9 mm) with less than 10% residual stenosis and TIMI-3 flow. Recommendations: 1. Continue dual antiplatelet therapy with aspirin and clopidogrel for at least 12 months. 2. Titrate nitroglycerin for relief of chest pain that occurred during atherectomy and balloon inflation as well as for improved blood pressure control. 3. Remove left femoral artery sheath with manual compression once ACT has fallen below 175 seconds. 4. Aggressive secondary prevention. [name], MD [facility]    Cardiac Studies   LEFT HEART CATH AND CORONARY ANGIOGRAPHY  Conclusion  Conclusions: 1. Severe single vessel coronary artery disease with 80-90% proximal LAD. Lesion is suboptimally visualized due to difficult catheter engagement but appears to be heavily calcified. 2. Mild to moderate mid/distal LAD, LCx and RCA/rPDA disease. 3. Mildly elevated left ventricular filling pressure with hyperdynamic LV contraction. 4. Tortuous right subclavian/brachiocephalic artery.  Recommend alternate access for further catheterizations.  Recommendations: 1. Transfer to [facility] for continued monitoring and medical optimization. Anticipate PCI to proximal LAD (potentially with atherectomy) on Monday via femoral approach. 2. Gentle post-catheterization hydration given chronic kidney disease. 3. Aggressive secondary prevention, including improved blood pressure control. 4. Start IV heparin 2 hours after TR band removal.    Diagnostic Dominance: Co-dominant  Intervention   Echo 06/27/19 1. Left ventricular ejection fraction, by estimation, is 60 to 65%. The  left ventricle has normal function. The left ventricle has no regional  wall motion abnormalities. There is moderate left ventricular hypertrophy.  Left ventricular diastolic  parameters are  consistent with Grade I diastolic dysfunction (  impaired  relaxation).  2. Right ventricular systolic function is normal. The right ventricular  size is normal.  3. Left atrial size was mildly dilated.  4. Tricuspid valve regurgitation is mild to moderate.   Procedures  CORONARY STENT INTERVENTION  CORONARY ATHERECTOMY  Intravascular Ultrasound/IVUS  Conclusion  Conclusions: 5. Severely diseased proximal and mid LAD with multifocal stenoses of up to 80-90% and heavy calcification. 6. Successful IVUS-guided orbital atherectomy and PCI of the ostial through mid LAD using a Synergy 3.0 x 48 mm drug-eluting stent (postdilated up to 3.9 mm) with less than 10% residual stenosis and TIMI-3 flow.  Recommendations: 5. Continue dual antiplatelet therapy with aspirin and clopidogrel for at least 12 months. 6. Titrate nitroglycerin for relief of chest pain that occurred during atherectomy and balloon inflation as well as for improved blood pressure control. 7. Remove left femoral artery sheath with manual compression once ACT has fallen below 175 seconds. 8. Aggressive secondary prevention.  [name], MD [facility]      Patient Profile     86 y.o. male with CAD status post PCI to the RCA in 03/2018, PVD, DM 2, HTN, HLD, and prostate cancer who presented to [facility] for outpatient diagnostic LHC and found to severe stenosis in pLAD and transferred to [facility] of arterectomy.   Assessment & Plan    1. CAD - Cath  showing severe single vessel coronary artery disease with diffuse and heavily calcified 80-90% proximal LAD.  - Now s/p atherectomy and stenting of the LAD with long DES -Continue aspirin, Plavix, statin and beta-blocker  2.  Hypertension -Blood pressure is elevated despite on multiple antihypertensive regimen -Cannot uptitrate beta-blocker secondary to baseline bradycardia -will increase hydralazine today  - also on Coreg, losartan, Cardura, Imdur and amlodipine.   3.   Hyperlipidemia - 06/27/2019: Cholesterol 135; HDL 61; LDL Cholesterol 62; Triglycerides 62; VLDL 12  - On Lipitor 40mg  qd >> increase to 80mg  qd given significant CAD  4. DM - A1c 6.7 - On SSI  5. Anemia - Hgb 10>>8.2>>8.4>>7.8  (could be dilutional as he was given hydration over the weekend) -On dual antiplatelet therapy prior to presenting for cath -iron studies show iron deficiency. -was transfused one unit of PRBCs yesterday.  - Hgb improved to 9.2 today.  - on oral iron. Will give IV ferreheme today.  6. CKD III -Renal function stable at 1.58  Disposition. Will transfer to telemetry today. Given prolonged chest pain post procedure and uncontrolled HTN will monitor in hospital today. Anticipate DC tomorrow.   For questions or updates, please contact [place] Please consult [email or web address] for contact info under        Signed, Ladislaus Repsher [place], MD  07/06/2019, 8:00 AM

## 2019-07-06 NOTE — Discharge Summary (Signed)
[facility] Discharge Summary   Patient: Peter [name] [id number]  PCP: Lesleigh Noe, MD  Date of admission: 07/02/2019   Date of discharge: 07/02/2019      Discharge Diagnoses:  Principal Problem:   Unstable angina [facility]) Active Problems:   Hypertension   CKD (chronic kidney disease) stage 3, GFR 30-59 ml/min   CAD in native artery   Admitted From: Home Disposition:   Transferred to [facility] for further care under cardiology service.  Recommendations for Outpatient Follow-up:  Depending on hospital course at [facility].  Diet recommendation: Cardiac diet  Activity: The patient is advised to gradually reintroduce usual activities, as tolerated  Discharge Condition: stable  Code Status: Full code   History of present illness: As per the H and P dictated on admission, "Peter [name] previously underwent LHC on 03/27/2018 in [place] which demonstrated 60% proximal LAD stenosis, 30% mid LAD stenosis, 40% distal LCx stenosis, RCA with sequential 95% proximal, 70% mid, and 40% distal stenoses. He underwent successful PCI/DES to the proximal and mid RCA using Xience Sierra 2.5 x 38 mm and Xience Sierra 2.5 x 23 mm drug-eluting stents. Echo on 04/22/2018 in [place] showed an LVEF of 50 to [id number], grade 1 diastolic dysfunction, normal RV systolic function and ventricular cavity size, and no significant valvular abnormality.    He was recently evaluated in the [facility] ED on 06/27/2018 with intermittent episodes of crushing substernal chest pain that occurred at rest and did not improve with aspirin, though was relieved with nitroglycerin. ED evaluation was notable for mildly elevated high-sensitivity troponin of 38 with a delta of 47. He declined hospital admission as cardiac imaging could not be performed until the next day, given he presented on Sunday.    He was seen by Dr. Saunders Revel on 06/30/2019 noting a few episodes of questionable chest discomfort since leaving  the hospital. He had taken sublingual nitroglycerin once with prompt resolution of chest discomfort. He denied any associated symptoms with his chest pain. He reported chronic abdominal pain which was stable and at his baseline. He did note his home BPs have been quite elevated recently with systolic readings up to [id number] mmHg. BP in the office that day was elevated at 170/60. Imdur 30 mg daily was added to his regimen. It was recommended he undergo diagnostic LHC.    [facility], for outpatient diagnostic LHC which demonstrated severe single-vessel CAD with 80 to 90% proximal LAD stenoses. The lesion was suboptimally visualized due to difficult catheter engagement though appeared to be heavily calcified. Given the above, it was recommended the patient be transferred to [facility] for continued monitoring and medical optimization with anticipation for PCI to the proximal LAD with potential atherectomy on Monday, May 24th, 2021 via the femoral approach.  "  Hospital Course:  Summary of his active problems in the hospital is as following. 1.  Unstable angina. CAD SP PCI in February 2020. Hypertension Hyperlipidemia Cardiac catheterization on 07/02/2019 shows evidence of proximal LAD stenosis. Currently patient will require further work-up and treatment. Dr. Saunders Revel cardiology has arranged for transfer to [facility] under cardiology service for atherectomy and cardiac catheterization on 07/05/2019 Monday. Currently on IV heparin. Resuming home medication including aspirin, Plavix, Imdur, Norvasc, Lipitor.  2.  Essential hypertension. Blood pressure elevated. Continue current regimen. Anticipating improvement with regimen  3.  Chronic kidney disease stage IIIb Renal function currently stable.  Patient underwent cardiac catheterization and at risk  for contrast-induced nephropathy. Currently receiving IV hydration. Monitor for volume overload. Monitor renal  function daily as well. Avoid nephrotoxic medication.  4.  Type 2 diabetes mellitus with chronic peripheral neuropathy as well as poorly kidney disease. Currently controlled. Continuing insulin with sliding scale. Continue gabapentin as well.  5.  History of gout Resuming allopurinol.  6.  BPH. Follows up with urology outpatient. Continuing finasteride.  7.  History of dementia Currently does not appear to be having any behavioral issues. Continue Aricept and Namenda.  8.  Depression Continue Zoloft  Patient was admitted by cardiology to [facility] service was consulted to take over the patient while the patient is awaiting transfer to [facility].  This discharge summary serves as documentation purposes only.  Patient was discharged by overnight coverage.  Filed Weights   07/02/19 1108 07/02/19 1613  Weight: 78.5 kg 78.7 kg   Vitals:   07/02/19 2016 07/02/19 2029  BP: (!) 162/55 (!) 169/55  Pulse: 83 79  Resp: 18 20  Temp: 98.8 F (37.1 C) 98.8 F (37.1 C)  SpO2: 97% 98%    DISCHARGE MEDICATION: Allergies as of 07/02/2019      Reactions   Penicillins Anaphylaxis   Reported airway compromise      Medication List    ASK your doctor about these medications   allopurinol 100 MG tablet Commonly known as: ZYLOPRIM TAKE 1 TABLET BY MOUTH EVERY DAY   amLODipine 10 MG tablet Commonly known as: NORVASC TAKE 1 TABLET BY MOUTH EVERY DAY   aspirin EC 81 MG tablet Take 81 mg by mouth daily.   atorvastatin 40 MG tablet Commonly known as: LIPITOR TAKE 1 TABLET BY MOUTH EVERY DAY   beta carotene 25000 UNIT capsule Take 25,000 Units by mouth daily.   clopidogrel 75 MG tablet Commonly known as: PLAVIX Take 1 tablet (75 mg total) by mouth daily.   donepezil 10 MG tablet Commonly known as: ARICEPT TAKE 1 TABLET BY MOUTH EVERYDAY AT BEDTIME   doxazosin 4 MG tablet Commonly known as: Cardura Take 1 tablet (4 mg total) by  mouth at bedtime.   ferrous sulfate 325 (65 FE) MG tablet TAKE 1 TABLET BY MOUTH EVERY DAY   finasteride 5 MG tablet Commonly known as: PROSCAR Take 1 tablet (5 mg total) by mouth daily.   Fish Oil 500 MG Caps Take 500 mg by mouth 2 (two) times a week.   Flax Seed Oil 1000 MG Caps Take 1,000 mg by mouth 2 (two) times a week.   folic acid A999333 MCG tablet Commonly known as: FOLVITE Take 400 mcg by mouth 2 (two) times a week.   gabapentin 300 MG capsule Commonly known as: NEURONTIN Take 1 capsule (300 mg total) by mouth 2 (two) times daily.   insulin glargine 100 UNIT/ML injection Commonly known as: LANTUS Inject 0.2 mLs (20 Units total) into the skin at bedtime.   Insulin Syringe-Needle U-100 31G X 5/16" 1 ML Misc Commonly known as: TRUEplus Insulin Syringe Use daily as directed. Dispense needles as prescribed in the past. DX: E11.22   isosorbide mononitrate 30 MG 24 hr tablet Commonly known as: IMDUR Take 1 tablet (30 mg total) by mouth daily.   losartan 100 MG tablet Commonly known as: COZAAR Take 100 mg by mouth daily.   memantine 10 MG tablet Commonly known as: NAMENDA TAKE 1 TABLET BY MOUTH TWICE A DAY   nitroGLYCERIN 0.4 MG SL tablet Commonly known as: NITROSTAT Place  1 tablet (0.4 mg total) under the tongue every 5 (five) minutes x 3 doses as needed for chest pain.   selenium 50 MCG Tabs tablet Take 50 mcg by mouth 2 (two) times a week.   sertraline 25 MG tablet Commonly known as: ZOLOFT TAKE 1 TABLET BY MOUTH EVERY DAY   vitamin C 1000 MG tablet Take 1,000 mg by mouth daily.      Allergies  Allergen Reactions  . Penicillins Anaphylaxis    Reported airway compromise     The results of significant diagnostics from this hospitalization (including imaging, microbiology, ancillary and laboratory) are listed below for reference.    Significant Diagnostic Studies: CARDIAC CATHETERIZATION  Result Date: 07/05/2019 Conclusions: 1. Severely diseased  proximal and mid LAD with multifocal stenoses of up to 80-90% and heavy calcification. 2. Successful IVUS-guided orbital atherectomy and PCI of the ostial through mid LAD using a Synergy 3.0 x 48 mm drug-eluting stent (postdilated up to 3.9 mm) with less than 10% residual stenosis and TIMI-3 flow. Recommendations: 1. Continue dual antiplatelet therapy with aspirin and clopidogrel for at least 12 months. 2. Titrate nitroglycerin for relief of chest pain that occurred during atherectomy and balloon inflation as well as for improved blood pressure control. 3. Remove left femoral artery sheath with manual compression once ACT has fallen below 175 seconds. 4. Aggressive secondary prevention. [name], MD [facility]   CARDIAC CATHETERIZATION  Addendum Date: 07/02/2019   Conclusions: 1. Severe single vessel coronary artery disease with 80-90% proximal LAD.  Lesion is suboptimally visualized due to difficult catheter engagement but appears to be heavily calcified. 2. Mild to moderate mid/distal LAD, LCx and RCA/rPDA disease. 3. Mildly elevated left ventricular filling pressure with hyperdynamic LV contraction. 4. Tortuous right subclavian/brachiocephalic artery.  Recommend alternate access for further catheterizations.  Recommendations: 1. Transfer to [facility] for continued monitoring and medical optimization.  Anticipate PCI to proximal LAD (potentially with atherectomy) on Monday via femoral approach. 2. Gentle post-catheterization hydration given chronic kidney disease. 3. Aggressive secondary prevention, including improved blood pressure control. 4. Start IV heparin 2 hours after TR band removal. [name], MD [facility]   Result Date: 07/02/2019 Conclusions: 1. Severe single vessel coronary artery disease with 80-90% proximal LAD.  Lesion is suboptimally visualized due to difficult catheter engagement but appears to be heavily calcified. 2. Mild to moderate mid/distal LAD, LCx and RCA/rPDA  disease. 3. Mildly elevated left ventricular filling pressure with hyperdynamic LV contraction. 4. Tortuous right subclavian/brachiocephalic artery.  Recommend alternate access for further catheterizations.  Recommendations: 1. Transfer to [facility] for continued monitoring and medical optimization.  Anticipate PCI to proximal LAD (potentially with atherectomy) on Monday via femoral approach. 2. Gentle post-catheterization hydration given chronic kidney disease. 3. Aggressive secondary prevention, including improved blood pressure control. 4. Start IV heparin 2 hours after TR band removal. [name], MD [facility]   DG Chest Port 1 View  Result Date: 06/27/2019 CLINICAL DATA:  Chest pressure EXAM: PORTABLE CHEST 1 VIEW COMPARISON:  None. FINDINGS: The heart size and mediastinal contours are within normal limits. Both lungs are clear. The visualized skeletal structures are unremarkable. Recording device over the left lower chest IMPRESSION: No active disease. Electronically Signed   By: Donavan Foil M.D.   On: 06/27/2019 02:13   ECHOCARDIOGRAM COMPLETE  Result Date: 06/27/2019    ECHOCARDIOGRAM REPORT   Patient Name:   Peter [name] Date of Exam: 06/27/2019 Medical Rec #:  [id number]  Height:       67.0 in Accession #:    [id number]       Weight:       172.0 lb Date of Birth:  09/23/1930        BSA:          1.897 m Patient Age:    33 years         BP:           161/56 mmHg Patient Gender: M                HR:           65 bpm. Exam Location:  [facility] Procedure: 2D Echo Indications:     CAD Native Vessel 414.01/ I25.10  History:         Patient has no prior history of Echocardiogram examinations.  Sonographer:     Arville Go RDCS Referring Phys:  [place] Phys: Ida Rogue MD IMPRESSIONS  1. Left ventricular ejection fraction, by estimation, is 60 to 65%. The left ventricle has normal function. The left ventricle has no regional wall motion abnormalities. There  is moderate left ventricular hypertrophy. Left ventricular diastolic parameters are consistent with Grade I diastolic dysfunction (impaired relaxation).  2. Right ventricular systolic function is normal. The right ventricular size is normal.  3. Left atrial size was mildly dilated.  4. Tricuspid valve regurgitation is mild to moderate. FINDINGS  Left Ventricle: Left ventricular ejection fraction, by estimation, is 60 to 65%. The left ventricle has normal function. The left ventricle has no regional wall motion abnormalities. The left ventricular internal cavity size was normal in size. There is  moderate left ventricular hypertrophy. Left ventricular diastolic parameters are consistent with Grade I diastolic dysfunction (impaired relaxation). Right Ventricle: The right ventricular size is normal. No increase in right ventricular wall thickness. Right ventricular systolic function is normal. Left Atrium: Left atrial size was mildly dilated. Right Atrium: Right atrial size was normal in size. Pericardium: There is no evidence of pericardial effusion. Mitral Valve: The mitral valve is normal in structure. Normal mobility of the mitral valve leaflets. Mild mitral valve regurgitation. No evidence of mitral valve stenosis. Tricuspid Valve: The tricuspid valve is normal in structure. Tricuspid valve regurgitation is mild to moderate. No evidence of tricuspid stenosis. Aortic Valve: The aortic valve is normal in structure. Aortic valve regurgitation is trivial. No aortic stenosis is present. Aortic valve peak gradient measures 9.6 mmHg. Pulmonic Valve: The pulmonic valve was normal in structure. Pulmonic valve regurgitation is not visualized. No evidence of pulmonic stenosis. Aorta: The aortic root is normal in size and structure. Venous: The inferior vena cava is normal in size with greater than 50% respiratory variability, suggesting right atrial pressure of 3 mmHg. IAS/Shunts: No atrial level shunt detected by color flow  Doppler.  LEFT VENTRICLE PLAX 2D LVIDd:         4.86 cm  Diastology LVIDs:         3.15 cm  LV e' lateral:   4.35 cm/s LV PW:         1.53 cm  LV E/e' lateral: 22.9 LV IVS:        1.50 cm  LV e' medial:    5.77 cm/s LVOT diam:     2.20 cm  LV E/e' medial:  17.3 LV SV:         112 LV SV Index:   59 LVOT Area:  3.80 cm  RIGHT VENTRICLE RV Basal diam:  3.07 cm RV S prime:     13.90 cm/s TAPSE (M-mode): 2.0 cm LEFT ATRIUM             Index       RIGHT ATRIUM           Index LA diam:        4.10 cm 2.16 cm/m  RA Area:     11.80 cm LA Vol (A2C):   67.5 ml 35.59 ml/m RA Volume:   26.40 ml  13.92 ml/m LA Vol (A4C):   55.4 ml 29.21 ml/m LA Biplane Vol: 65.1 ml 34.32 ml/m  AORTIC VALVE                PULMONIC VALVE AV Area (Vmax): 2.84 cm    PV Vmax:       1.18 m/s AV Vmax:        155.00 cm/s PV Peak grad:  5.6 mmHg AV Peak Grad:   9.6 mmHg LVOT Vmax:      116.00 cm/s LVOT Vmean:     78.200 cm/s LVOT VTI:       0.294 m  AORTA Ao Root diam: 3.50 cm Ao Asc diam:  3.30 cm MITRAL VALVE MV Area (PHT): 2.66 cm    SHUNTS MV Decel Time: 285 msec    Systemic VTI:  0.29 m MV E velocity: 99.80 cm/s  Systemic Diam: 2.20 cm MV A velocity: 99.40 cm/s MV E/A ratio:  1.00 Ida Rogue MD Electronically signed by Ida Rogue MD Signature Date/Time: 06/27/2019/4:15:43 PM    Final     Microbiology: Recent Results (from the past 240 hour(s))  SARS Coronavirus 2 by RT PCR (hospital order, performed in [place] hospital lab) Nasopharyngeal Nasopharyngeal Swab     Status: None   Collection Time: 06/27/19  3:18 AM   Specimen: Nasopharyngeal Swab  Result Value Ref Range Status   SARS Coronavirus 2 NEGATIVE NEGATIVE Final    Comment: (NOTE) SARS-CoV-2 target nucleic acids are NOT DETECTED. The SARS-CoV-2 RNA is generally detectable in upper and lower respiratory specimens during the acute phase of infection. The lowest concentration of SARS-CoV-2 viral copies this assay can detect is 250 copies / mL. A negative result  does not preclude SARS-CoV-2 infection and should not be used as the sole basis for treatment or other patient management decisions.  A negative result may occur with improper specimen collection / handling, submission of specimen other than nasopharyngeal swab, presence of viral mutation(s) within the areas targeted by this assay, and inadequate number of viral copies (<250 copies / mL). A negative result must be combined with clinical observations, patient history, and epidemiological information. Fact Sheet for Patients:   StrictlyIdeas.no Fact Sheet for Healthcare Providers: BankingDealers.co.za This test is not yet approved or cleared  by the [place] FDA and has been authorized for detection and/or diagnosis of SARS-CoV-2 by FDA under an Emergency Use Authorization (EUA).  This EUA will remain in effect (meaning this test can be used) for the duration of the COVID-19 declaration under Section 564(b)(1) of the Act, 21 U.S.C. section 360bbb-3(b)(1), unless the authorization is terminated or revoked sooner. Performed at [facility], [place], [place] [address]   SARS CORONAVIRUS 2 (TAT 6-24 HRS) Nasopharyngeal Nasopharyngeal Swab     Status: None   Collection Time: 07/01/19 10:06 AM   Specimen: Nasopharyngeal Swab  Result Value Ref Range Status   SARS Coronavirus 2  NEGATIVE NEGATIVE Final    Comment: (NOTE) SARS-CoV-2 target nucleic acids are NOT DETECTED. The SARS-CoV-2 RNA is generally detectable in upper and lower respiratory specimens during the acute phase of infection. Negative results do not preclude SARS-CoV-2 infection, do not rule out co-infections with other pathogens, and should not be used as the sole basis for treatment or other patient management decisions. Negative results must be combined with clinical observations, patient history, and epidemiological information. The expected result  is Negative. Fact Sheet for Patients: SugarRoll.be Fact Sheet for Healthcare Providers: https://www.woods-mathews.com/ This test is not yet approved or cleared by the [place] FDA and  has been authorized for detection and/or diagnosis of SARS-CoV-2 by FDA under an Emergency Use Authorization (EUA). This EUA will remain  in effect (meaning this test can be used) for the duration of the COVID-19 declaration under Section 56 4(b)(1) of the Act, 21 U.S.C. section 360bbb-3(b)(1), unless the authorization is terminated or revoked sooner. Performed at [facility], [place] [address], [place], [place] [address]   Surgical PCR screen     Status: None   Collection Time: 07/05/19  1:31 AM   Specimen: Nasal Mucosa; Nasal Swab  Result Value Ref Range Status   MRSA, PCR NEGATIVE NEGATIVE Final   Staphylococcus aureus NEGATIVE NEGATIVE Final    Comment: (NOTE) The Xpert SA Assay (FDA approved for NASAL specimens in patients 43 years of age and older), is one component of a comprehensive surveillance program. It is not intended to diagnose infection nor to guide or monitor treatment. Performed at [facility], [place] [address] [place], [place] [address]      Labs: CBC: Recent Labs  Lab 06/30/19 1714 06/30/19 1714 07/03/19 0319 07/03/19 0319 07/04/19 0432 07/05/19 0308 07/05/19 1537 07/05/19 2029 07/06/19 0247  WBC 11.5*   < > 10.3  --  10.3 9.5  --  12.7* 10.9*  NEUTROABS 8.8*  --   --   --   --   --   --   --   --   HGB 10.0*   < > 8.2*   < > 8.4* 7.8* 8.8* 10.1* 9.2*  HCT 29.5*   < > 25.5*   < > 26.2* 24.5* 26.0* 30.7* 28.3*  MCV 85.0   < > 88.5  --  88.5 90.1  --  87.7 87.9  PLT 305   < > 275  --  274 278  --  283 267   < > = values in this interval not displayed.   Basic Metabolic Panel: Recent Labs  Lab 06/30/19 1714 06/30/19 1714 07/03/19 0319 07/05/19 0451 07/05/19 1537 07/05/19 2029 07/06/19 0247  NA 139  --   140 141 141  --  140  K 4.4  --  4.3 4.4 4.7  --  4.7  CL 107  --  109 115* 110  --  110  CO2 21*  --  24 22  --   --  20*  GLUCOSE 131*  --  114* 115* 81  --  87  BUN 36*  --  34* 31* 26*  --  29*  CREATININE 1.40*   < > 1.52* 1.59* 1.50* 1.36* 1.58*  CALCIUM 8.4*  --  7.7* 7.8*  --   --  8.3*   < > = values in this interval not displayed.   Liver Function Tests: No results for input(s): AST, ALT, ALKPHOS, BILITOT, PROT, ALBUMIN in the last 168 hours. No results for  input(s): LIPASE, AMYLASE in the last 168 hours. No results for input(s): AMMONIA in the last 168 hours. Cardiac Enzymes: No results for input(s): CKTOTAL, CKMB, CKMBINDEX, TROPONINI in the last 168 hours. BNP (last 3 results) No results for input(s): BNP in the last 8760 hours. CBG: Recent Labs  Lab 07/05/19 1459 07/05/19 1812 07/05/19 2215 07/06/19 0640 07/06/19 1111  GLUCAP 72 82 75 82 110*    Time spent: 35 minutes  Signed:  Berle Mull  [facility] 07/02/2019 6:48 PM

## 2019-07-06 NOTE — Progress Notes (Signed)
CARDIAC REHAB PHASE I   PRE:  Rate/Rhythm: 65 SR    BP: sitting 165/67    SaO2: 93 RA  MODE:  Ambulation: 370 ft   POST:  Rate/Rhythm: 72 SR    BP: sitting 175/64     SaO2: 99 RA  Pt able to move out of bed independently but needed a RW for hall due to stiffness/arthritis. He has rollator and RW at home. He denied CP walking. Discussed stent, Plavix, diet, and CRPII. Pt receptive and is thinking about CRPII. Will refer to [facility]. Will f/u tomorrow for more ambulation and rest of education. Pt is very vibrant mentally. [place], ACSM 07/06/2019 11:51 AM

## 2019-07-07 ENCOUNTER — Telehealth: Payer: Self-pay | Admitting: Nurse Practitioner

## 2019-07-07 ENCOUNTER — Encounter (HOSPITAL_COMMUNITY): Payer: Self-pay | Admitting: Cardiology

## 2019-07-07 LAB — BASIC METABOLIC PANEL
Anion gap: 9 (ref 5–15)
BUN: 31 mg/dL — ABNORMAL HIGH (ref 8–23)
CO2: 21 mmol/L — ABNORMAL LOW (ref 22–32)
Calcium: 8.4 mg/dL — ABNORMAL LOW (ref 8.9–10.3)
Chloride: 109 mmol/L (ref 98–111)
Creatinine, Ser: 1.79 mg/dL — ABNORMAL HIGH (ref 0.61–1.24)
GFR calc Af Amer: 39 mL/min — ABNORMAL LOW (ref >60)
GFR calc non Af Amer: 33 mL/min — ABNORMAL LOW (ref >60)
Glucose, Bld: 160 mg/dL — ABNORMAL HIGH (ref 70–99)
Potassium: 4.3 mmol/L (ref 3.5–5.1)
Sodium: 139 mmol/L (ref 135–145)

## 2019-07-07 LAB — CBC
HCT: 30.4 % — ABNORMAL LOW (ref 39.0–52.0)
Hemoglobin: 9.9 g/dL — ABNORMAL LOW (ref 13.0–17.0)
MCH: 28.5 pg (ref 26.0–34.0)
MCHC: 32.6 g/dL (ref 30.0–36.0)
MCV: 87.6 fL (ref 80.0–100.0)
Platelets: 273 10*3/uL (ref 150–400)
RBC: 3.47 MIL/uL — ABNORMAL LOW (ref 4.22–5.81)
RDW: 14.4 % (ref 11.5–15.5)
WBC: 12.1 10*3/uL — ABNORMAL HIGH (ref 4.0–10.5)
nRBC: 0 % (ref 0.0–0.2)

## 2019-07-07 LAB — GLUCOSE, CAPILLARY
Glucose-Capillary: 122 mg/dL — ABNORMAL HIGH (ref 70–99)
Glucose-Capillary: 93 mg/dL (ref 70–99)

## 2019-07-07 MED ORDER — NITROGLYCERIN 0.4 MG SL SUBL: 0.4000 mg | SUBLINGUAL_TABLET | SUBLINGUAL | 1 refills | Status: DC | PRN

## 2019-07-07 MED ORDER — CARVEDILOL 6.25 MG PO TABS: 6.2500 mg | ORAL_TABLET | Freq: Two times a day (BID) | ORAL | 0 refills | Status: DC

## 2019-07-07 MED ORDER — ATORVASTATIN CALCIUM 80 MG PO TABS: 80.0000 mg | ORAL_TABLET | Freq: Every day | ORAL | 0 refills | Status: DC

## 2019-07-07 MED ORDER — LOPERAMIDE HCL 2 MG PO CAPS: 2.0000 mg | ORAL_CAPSULE | ORAL | 0 refills | Status: DC | PRN

## 2019-07-07 MED ORDER — LOPERAMIDE HCL 2 MG PO CAPS: 2.0000 mg | ORAL_CAPSULE | ORAL | Status: DC | PRN

## 2019-07-07 MED ORDER — HYDRALAZINE HCL 50 MG PO TABS: 50.0000 mg | ORAL_TABLET | Freq: Three times a day (TID) | ORAL | 1 refills | Status: DC

## 2019-07-07 MED ORDER — ISOSORBIDE MONONITRATE ER 60 MG PO TB24: 60.0000 mg | ORAL_TABLET | Freq: Every day | ORAL | 0 refills | Status: DC

## 2019-07-07 MED ORDER — LOPERAMIDE HCL 2 MG PO CAPS
4.0000 mg | ORAL_CAPSULE | Freq: Once | ORAL | Status: AC
  Administered 2019-07-07: 4 mg via ORAL
  Filled 2019-07-07: qty 2

## 2019-07-07 MED ORDER — HYDRALAZINE HCL 50 MG PO TABS: 50.0000 mg | ORAL_TABLET | Freq: Three times a day (TID) | ORAL | 0 refills | Status: DC

## 2019-07-07 MED FILL — CARVEDILOL 6.25 MG TABLET: 6.25 | 90 days supply | Qty: 180 | Fill #0

## 2019-07-07 MED FILL — ATORVASTATIN CALCIUM 80 MG: 80 | 90 days supply | Qty: 90 | Fill #0

## 2019-07-07 MED FILL — hydrALAZINE HCL 50 MG TABS: 50 | 30 days supply | Qty: 90 | Fill #0

## 2019-07-07 MED FILL — LOPERAMIDE 2 MG CAPSULE: 2 | 5 days supply | Qty: 30 | Fill #0

## 2019-07-07 MED FILL — ISOSORBIDE MN ER 60 MG TAB: 60 | 90 days supply | Qty: 90 | Fill #0

## 2019-07-07 NOTE — Discharge Summary (Signed)
Discharge Summary    Patient ID: Peter [name],  MRN: [id number], DOB/AGE: Apr 26, 1930 86 y.o.  Admit date: 07/02/2019 Discharge date: 07/07/2019  Primary Care Provider: Lesleigh [name] Primary Cardiologist: Dr. Saunders [name]  Discharge Diagnoses    Principal Problem:   NSTEMI (non-ST elevated myocardial infarction) [facility]) Active Problems:   Hypertension   DM (diabetes mellitus) type II controlled with renal manifestation (HCC)   CKD (chronic kidney disease) stage 3, GFR 30-59 ml/min   Dementia (HCC)   Hyperlipidemia   Unstable angina (HCC)   CAD in native artery   Allergies Allergies  Allergen Reactions  . Penicillins Anaphylaxis    Reported airway compromise    Diagnostic Studies/Procedures    Cath: 07/02/19  Conclusions: 1. Severe single vessel coronary artery disease with 80-90% proximal LAD. Lesion is suboptimally visualized due to difficult catheter engagement but appears to be heavily calcified. 2. Mild to moderate mid/distal LAD, LCx and RCA/rPDA disease. 3. Mildly elevated left ventricular filling pressure with hyperdynamic LV contraction. 4. Tortuous right subclavian/brachiocephalic artery.  Recommend alternate access for further catheterizations.  Recommendations: 1. Transfer to [facility] for continued monitoring and medical optimization. Anticipate PCI to proximal LAD (potentially with atherectomy) on Monday via femoral approach. 2. Gentle post-catheterization hydration given chronic kidney disease. 3. Aggressive secondary prevention, including improved blood pressure control. 4. Start IV heparin 2 hours after TR band removal.  Peter Bush, MD  Diagnostic Dominance: Co-dominant   Cath: 07/05/19  Conclusions: 5. Severely diseased proximal and mid LAD with multifocal stenoses of up to 80-90% and heavy calcification. 6. Successful IVUS-guided orbital atherectomy and PCI of the ostial through mid LAD using a Synergy 3.0 x 48 mm drug-eluting stent  (postdilated up to 3.9 mm) with less than 10% residual stenosis and TIMI-3 flow.  Recommendations: 5. Continue dual antiplatelet therapy with aspirin and clopidogrel for at least 12 months. 6. Titrate nitroglycerin for relief of chest pain that occurred during atherectomy and balloon inflation as well as for improved blood pressure control. 7. Remove left femoral artery sheath with manual compression once ACT has fallen below 175 seconds. 8. Aggressive secondary prevention.  Peter Bush, MD  Diagnostic Dominance: Co-dominant  Intervention    _____________   History of Present Illness     Peter [name] is a 86 yo male with PMH of CAD status post PCI to the RCA in 03/2018, PVD, DM 2, HTN, HLD, and prostate cancer who presented to [facility] for outpatient diagnostic LHC with recommendation to transfer to [facility] as detailed below. He  previously underwent LHC on 03/27/2018 in [place] which demonstrated 60% proximal LAD stenosis, 30% mid LAD stenosis, 40% distal LCx stenosis, RCA with sequential 95% proximal, 70% mid, and 40% distal stenoses. He underwent successful PCI/DES to the proximal and mid RCA using Xience Sierra 2.5 x 38 mm and Xience Sierra 2.5 x 23 mm drug-eluting stents. Echo on 04/22/2018 in [place] showed an LVEF of 50 to [id number], grade 1 diastolic dysfunction, normal RV systolic function and ventricular cavity size, and no significant valvular abnormality.    He was recently evaluated in the [facility] ED on 06/27/2018 with intermittent episodes of crushing substernal chest pain that occurred at rest and did not improve with aspirin, though was relieved with nitroglycerin. ED evaluation was notable for mildly elevated high-sensitivity troponin of 38 with a delta of 47. He declined hospital admission as cardiac imaging could not be performed until the next day, given he presented on Sunday.  He was seen by Dr. Saunders [name] on 06/30/2019 noting a few episodes of questionable chest discomfort  since leaving the hospital. He had taken sublingual nitroglycerin once with prompt resolution of chest discomfort. He denied any associated symptoms with his chest pain. He reported chronic abdominal pain which was stable and at his baseline. He did note his home BPs had been quite elevated recently with systolic readings up to [id number] mmHg. BP in the office that day was elevated at 170/60. Imdur 30 mg daily was added to his regimen. It was recommended he undergo diagnostic LHC.    He presented to [facility] on 07/02/2019, for outpatient diagnostic LHC which demonstrated severe single-vessel CAD with 80 to 90% proximal LAD stenoses. The lesion was suboptimally visualized due to difficult catheter engagement though appeared to be heavily calcified. Given the above, it was recommended the patient be transferred to [facility] for continued monitoring and medical optimization with anticipation for PCI to the proximal LAD with potential atherectomy on Monday, May 24th, 2021 via the femoral approach.    Hospital Course     Consultants: None  1. CAD: Cath  showing severe single vessel coronary artery disease with diffuse and heavily calcified 80-90% proximal LAD. hsTn peaked at 47. - Now s/p atherectomy and stenting of the LAD with long DES - Continued on aspirin, Plavix, statin and beta-blocker - worked well with cardiac rehab  2.  Hypertension: Blood pressure was elevated despite on multiple antihypertensive regimen. Cannot uptitrate beta-blocker secondary to baseline bradycardia. Hydralazine was increased with improvement. - continued on Coreg (started this admission), losartan, Cardura, Imdur and amlodipine.  - with elevated blood pressures would recommend checking renal dopplers as an outpatient.  3.  Hyperlipidemia: 06/27/2019: Cholesterol 135; HDL 61; LDL Cholesterol 62; Triglycerides 62; VLDL 12 - On Lipitor 40mg  qd PTA>> increased to 80mg  qd given significant CAD  4. DM: A1c 6.7 - covered  with SSI while inpatient. Will resume home regimen at the time of discharge.   5. Anemia:Hgb dropped to 7.8  (could be dilutional as he was given hydration over the weekend). Iron studies showed iron deficiency. Was transfused one unit of PRBCs with improvement to 9.2. Given IV feraheme with levels improving to 9.9.   6. CKD III -Renal function stable slightly elevated at 1.79 at discharge. Will plan for BMET in a week.  New meds were send to [facility] prior to discharge. _____________  Discharge Vitals Blood pressure (!) 154/58, pulse 66, temperature 98.8 F (37.1 C), temperature source Oral, resp. rate 20, weight 80.1 kg, SpO2 96 %.  Filed Weights   07/05/19 0454 07/06/19 0500 07/07/19 0500  Weight: 80.9 kg 80.7 kg 80.1 kg    Labs & Radiologic Studies    CBC Recent Labs    07/06/19 0247 07/07/19 0333  WBC 10.9* 12.1*  HGB 9.2* 9.9*  HCT 28.3* 30.4*  MCV 87.9 87.6  PLT 267 [id number]   Basic Metabolic Panel Recent Labs    07/06/19 0247 07/07/19 0333  NA 140 139  K 4.7 4.3  CL 110 109  CO2 20* 21*  GLUCOSE 87 160*  BUN 29* 31*  CREATININE 1.58* 1.79*  CALCIUM 8.3* 8.4*   Liver Function Tests No results for input(s): AST, ALT, ALKPHOS, BILITOT, PROT, ALBUMIN in the last 72 hours. No results for input(s): LIPASE, AMYLASE in the last 72 hours. Cardiac Enzymes No results for input(s): CKTOTAL, CKMB, CKMBINDEX, TROPONINI in the last 72 hours. BNP Invalid input(s): POCBNP D-Dimer No results for  input(s): DDIMER in the last 72 hours. Hemoglobin A1C No results for input(s): HGBA1C in the last 72 hours. Fasting Lipid Panel No results for input(s): CHOL, HDL, LDLCALC, TRIG, CHOLHDL, LDLDIRECT in the last 72 hours. Thyroid Function Tests No results for input(s): TSH, T4TOTAL, T3FREE, THYROIDAB in the last 72 hours.  Invalid input(s): FREET3 _____________  CARDIAC CATHETERIZATION  Result Date: 07/05/2019 Conclusions: 1. Severely diseased proximal and mid LAD with  multifocal stenoses of up to 80-90% and heavy calcification. 2. Successful IVUS-guided orbital atherectomy and PCI of the ostial through mid LAD using a Synergy 3.0 x 48 mm drug-eluting stent (postdilated up to 3.9 mm) with less than 10% residual stenosis and TIMI-3 flow. Recommendations: 1. Continue dual antiplatelet therapy with aspirin and clopidogrel for at least 12 months. 2. Titrate nitroglycerin for relief of chest pain that occurred during atherectomy and balloon inflation as well as for improved blood pressure control. 3. Remove left femoral artery sheath with manual compression once ACT has fallen below 175 seconds. 4. Aggressive secondary prevention. Peter Bush, MD [facility]   CARDIAC CATHETERIZATION  Addendum Date: 07/02/2019   Conclusions: 1. Severe single vessel coronary artery disease with 80-90% proximal LAD.  Lesion is suboptimally visualized due to difficult catheter engagement but appears to be heavily calcified. 2. Mild to moderate mid/distal LAD, LCx and RCA/rPDA disease. 3. Mildly elevated left ventricular filling pressure with hyperdynamic LV contraction. 4. Tortuous right subclavian/brachiocephalic artery.  Recommend alternate access for further catheterizations.  Recommendations: 1. Transfer to [facility] for continued monitoring and medical optimization.  Anticipate PCI to proximal LAD (potentially with atherectomy) on Monday via femoral approach. 2. Gentle post-catheterization hydration given chronic kidney disease. 3. Aggressive secondary prevention, including improved blood pressure control. 4. Start IV heparin 2 hours after TR band removal. Peter Bush, MD [facility]   Result Date: 07/02/2019 Conclusions: 1. Severe single vessel coronary artery disease with 80-90% proximal LAD.  Lesion is suboptimally visualized due to difficult catheter engagement but appears to be heavily calcified. 2. Mild to moderate mid/distal LAD, LCx and RCA/rPDA disease. 3. Mildly  elevated left ventricular filling pressure with hyperdynamic LV contraction. 4. Tortuous right subclavian/brachiocephalic artery.  Recommend alternate access for further catheterizations.  Recommendations: 1. Transfer to [facility] for continued monitoring and medical optimization.  Anticipate PCI to proximal LAD (potentially with atherectomy) on Monday via femoral approach. 2. Gentle post-catheterization hydration given chronic kidney disease. 3. Aggressive secondary prevention, including improved blood pressure control. 4. Start IV heparin 2 hours after TR band removal. Peter Bush, MD [facility]   DG Chest Port 1 View  Result Date: 06/27/2019 CLINICAL DATA:  Chest pressure EXAM: PORTABLE CHEST 1 VIEW COMPARISON:  None. FINDINGS: The heart size and mediastinal contours are within normal limits. Both lungs are clear. The visualized skeletal structures are unremarkable. Recording device over the left lower chest IMPRESSION: No active disease. Electronically Signed   By: Donavan Foil M.D.   On: 06/27/2019 02:13   ECHOCARDIOGRAM COMPLETE  Result Date: 06/27/2019    ECHOCARDIOGRAM REPORT   Patient Name:   Peter [name] Date of Exam: 06/27/2019 Medical Rec #:  [id number]        Height:       67.0 in Accession #:    [id number]       Weight:       172.0 lb Date of Birth:  1930-03-14        BSA:          1.897 m  Patient Age:    18 years         BP:           161/56 mmHg Patient Gender: M                HR:           65 bpm. Exam Location:  [facility] Procedure: 2D Echo Indications:     CAD Native Vessel 414.01/ I25.10  History:         Patient has no prior history of Echocardiogram examinations.  Sonographer:     Arville Go RDCS Referring Phys:  [place] Phys: Ida Rogue MD IMPRESSIONS  1. Left ventricular ejection fraction, by estimation, is 60 to 65%. The left ventricle has normal function. The left ventricle has no regional wall motion abnormalities. There is moderate left  ventricular hypertrophy. Left ventricular diastolic parameters are consistent with Grade I diastolic dysfunction (impaired relaxation).  2. Right ventricular systolic function is normal. The right ventricular size is normal.  3. Left atrial size was mildly dilated.  4. Tricuspid valve regurgitation is mild to moderate. FINDINGS  Left Ventricle: Left ventricular ejection fraction, by estimation, is 60 to 65%. The left ventricle has normal function. The left ventricle has no regional wall motion abnormalities. The left ventricular internal cavity size was normal in size. There is  moderate left ventricular hypertrophy. Left ventricular diastolic parameters are consistent with Grade I diastolic dysfunction (impaired relaxation). Right Ventricle: The right ventricular size is normal. No increase in right ventricular wall thickness. Right ventricular systolic function is normal. Left Atrium: Left atrial size was mildly dilated. Right Atrium: Right atrial size was normal in size. Pericardium: There is no evidence of pericardial effusion. Mitral Valve: The mitral valve is normal in structure. Normal mobility of the mitral valve leaflets. Mild mitral valve regurgitation. No evidence of mitral valve stenosis. Tricuspid Valve: The tricuspid valve is normal in structure. Tricuspid valve regurgitation is mild to moderate. No evidence of tricuspid stenosis. Aortic Valve: The aortic valve is normal in structure. Aortic valve regurgitation is trivial. No aortic stenosis is present. Aortic valve peak gradient measures 9.6 mmHg. Pulmonic Valve: The pulmonic valve was normal in structure. Pulmonic valve regurgitation is not visualized. No evidence of pulmonic stenosis. Aorta: The aortic root is normal in size and structure. Venous: The inferior vena cava is normal in size with greater than 50% respiratory variability, suggesting right atrial pressure of 3 mmHg. IAS/Shunts: No atrial level shunt detected by color flow Doppler.  LEFT  VENTRICLE PLAX 2D LVIDd:         4.86 cm  Diastology LVIDs:         3.15 cm  LV e' lateral:   4.35 cm/s LV PW:         1.53 cm  LV E/e' lateral: 22.9 LV IVS:        1.50 cm  LV e' medial:    5.77 cm/s LVOT diam:     2.20 cm  LV E/e' medial:  17.3 LV SV:         112 LV SV Index:   59 LVOT Area:     3.80 cm  RIGHT VENTRICLE RV Basal diam:  3.07 cm RV S prime:     13.90 cm/s TAPSE (M-mode): 2.0 cm LEFT ATRIUM             Index       RIGHT ATRIUM  Index LA diam:        4.10 cm 2.16 cm/m  RA Area:     11.80 cm LA Vol (A2C):   67.5 ml 35.59 ml/m RA Volume:   26.40 ml  13.92 ml/m LA Vol (A4C):   55.4 ml 29.21 ml/m LA Biplane Vol: 65.1 ml 34.32 ml/m  AORTIC VALVE                PULMONIC VALVE AV Area (Vmax): 2.84 cm    PV Vmax:       1.18 m/s AV Vmax:        155.00 cm/s PV Peak grad:  5.6 mmHg AV Peak Grad:   9.6 mmHg LVOT Vmax:      116.00 cm/s LVOT Vmean:     78.200 cm/s LVOT VTI:       0.294 m  AORTA Ao Root diam: 3.50 cm Ao Asc diam:  3.30 cm MITRAL VALVE MV Area (PHT): 2.66 cm    SHUNTS MV Decel Time: 285 msec    Systemic VTI:  0.29 m MV E velocity: 99.80 cm/s  Systemic Diam: 2.20 cm MV A velocity: 99.40 cm/s MV E/A ratio:  1.00 Ida Rogue MD Electronically signed by Ida Rogue MD Signature Date/Time: 06/27/2019/4:15:43 PM    Final    Disposition   Pt is being discharged home today in good condition.  Follow-up Plans & Appointments    Follow-up Information    Theora Gianotti, NP. Go on 07/15/2019.   Specialties: Nurse Practitioner, Cardiology, Radiology Why: @2 :20pm for hospital follow up with Dr. Darnelle Bos PA/NP Contact information: [address] [address] [phone]          Discharge Instructions    Amb Referral to Cardiac Rehabilitation   Complete by: As directed    Diagnosis:  Coronary Stents PTCA     After initial evaluation and assessments completed: Virtual Based Care may be provided alone or in conjunction with Phase 2  Cardiac Rehab based on patient barriers.: Yes   Diet - low sodium heart healthy   Complete by: As directed    Discharge instructions   Complete by: As directed    Groin Site Care Refer to this sheet in the next few weeks. These instructions provide you with information on caring for yourself after your procedure. Your caregiver may also give you more specific instructions. Your treatment has been planned according to current medical practices, but problems sometimes occur. Call your caregiver if you have any problems or questions after your procedure. HOME CARE INSTRUCTIONS You may shower 24 hours after the procedure. Remove the bandage (dressing) and gently wash the site with plain soap and water. Gently pat the site dry.  Do not apply powder or lotion to the site.  Do not sit in a bathtub, swimming pool, or whirlpool for 5 to 7 days.  No bending, squatting, or lifting anything over 10 pounds (4.5 kg) as directed by your caregiver.  Inspect the site at least twice daily.  Do not drive home if you are discharged the same day of the procedure. Have someone else drive you.  You may drive 24 hours after the procedure unless otherwise instructed by your caregiver.  What to expect: Any bruising will usually fade within 1 to 2 weeks.  Blood that collects in the tissue (hematoma) may be painful to the touch. It should usually decrease in size and tenderness within 1 to 2 weeks.  SEEK IMMEDIATE MEDICAL CARE IF:  You have unusual pain at the groin site or down the affected leg.  You have redness, warmth, swelling, or pain at the groin site.  You have drainage (other than a small amount of blood on the dressing).  You have chills.  You have a fever or persistent symptoms for more than 72 hours.  You have a fever and your symptoms suddenly get worse.  Your leg becomes pale, cool, tingly, or numb.  You have heavy bleeding from the site. Hold pressure on the site. [name]  PLEASE DO NOT MISS ANY DOSES OF  YOUR PLAVIX!!!!! Also keep a log of you blood pressures and bring back to your follow up appt. Please call the office with any questions.   Patients taking blood thinners should generally stay away from medicines like ibuprofen, Advil, Motrin, naproxen, and Aleve due to risk of stomach bleeding. You may take Tylenol as directed or talk to your primary doctor about alternatives.   Increase activity slowly   Complete by: As directed       Discharge Medications   Allergies as of 07/07/2019      Reactions   Penicillins Anaphylaxis   Reported airway compromise      Medication List    TAKE these medications   allopurinol 100 MG tablet Commonly known as: ZYLOPRIM TAKE 1 TABLET BY MOUTH EVERY DAY   amLODipine 10 MG tablet Commonly known as: NORVASC TAKE 1 TABLET BY MOUTH EVERY DAY   aspirin EC 81 MG tablet Take 81 mg by mouth daily.   atorvastatin 80 MG tablet Commonly known as: LIPITOR Take 1 tablet (80 mg total) by mouth daily. Start taking on: Jul 08, 2019 What changed:   medication strength  how much to take   beta carotene 25000 UNIT capsule Take 25,000 Units by mouth daily.   carvedilol 6.25 MG tablet Commonly known as: COREG Take 1 tablet (6.25 mg total) by mouth 2 (two) times daily with a meal.   clopidogrel 75 MG tablet Commonly known as: PLAVIX Take 1 tablet (75 mg total) by mouth daily.   donepezil 10 MG tablet Commonly known as: ARICEPT TAKE 1 TABLET BY MOUTH EVERYDAY AT BEDTIME What changed: See the new instructions.   doxazosin 4 MG tablet Commonly known as: Cardura Take 1 tablet (4 mg total) by mouth at bedtime.   ferrous sulfate 325 (65 FE) MG tablet TAKE 1 TABLET BY MOUTH EVERY DAY   finasteride 5 MG tablet Commonly known as: PROSCAR Take 5 mg by mouth daily. What changed: Another medication with the same name was removed. Continue taking this medication, and follow the directions you see here.   Fish Oil 500 MG Caps Take 500 mg by mouth 2  (two) times a week.   Flax Seed Oil 1000 MG Caps Take 1,000 mg by mouth 2 (two) times a week.   folic acid A999333 MCG tablet Commonly known as: FOLVITE Take 400 mcg by mouth 2 (two) times a week.   gabapentin 300 MG capsule Commonly known as: NEURONTIN Take 1 capsule (300 mg total) by mouth 2 (two) times daily.   hydrALAZINE 50 MG tablet Commonly known as: APRESOLINE Take 1 tablet (50 mg total) by mouth every 8 (eight) hours.   insulin glargine 100 UNIT/ML injection Commonly known as: LANTUS Inject 0.2 mLs (20 Units total) into the skin at bedtime.   Insulin Syringe-Needle U-100 31G X 5/16" 1 ML Misc Commonly known as: TRUEplus Insulin Syringe Use daily as directed. Dispense needles as  prescribed in the past. 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 Start taking on: Jul 08, 2019 What changed:   medication strength  how much to take   losartan 100 MG tablet Commonly known as: COZAAR Take 100 mg by mouth daily.   memantine 10 MG tablet Commonly known as: NAMENDA TAKE 1 TABLET BY MOUTH TWICE A DAY   nitroGLYCERIN 0.4 MG SL tablet Commonly known as: NITROSTAT Place 1 tablet (0.4 mg total) under the tongue every 5 (five) minutes x 3 doses as needed for chest pain.   selenium 50 MCG Tabs tablet Take 50 mcg by mouth 2 (two) times a week.   sertraline 25 MG tablet Commonly known as: ZOLOFT TAKE 1 TABLET BY MOUTH EVERY DAY   vitamin C 1000 MG tablet Take 1,000 mg by mouth daily.        Aspirin prescribed at discharge?  Yes High Intensity Statin Prescribed? (Lipitor 40-80mg  or Crestor 20-40mg ): Yes Beta Blocker Prescribed? Yes For EF <40%, was ACEI/ARB Prescribed? Yes ADP Receptor Inhibitor Prescribed? (i.e. Plavix etc.-Includes Medically Managed Patients): Yes For EF <40%, Aldosterone Inhibitor Prescribed? No: N/a Was EF assessed during THIS hospitalization? No, echo on  06/27/19 Was Cardiac Rehab II ordered? (Included Medically managed Patients): Yes   Outstanding Labs/Studies   BMET at follow up appt, outpatient renal dopplers.  Duration of Discharge Encounter   Greater than 30 minutes including physician time.  Signed, Reino Bellis NP-C 07/07/2019, 11:39 AM

## 2019-07-07 NOTE — Care Management Important Message (Signed)
Important Message  Patient Details  Name: Peter [name] MRN: [id number] Date of Birth: Aug 05, 1930   Medicare Important Message Given:  Yes     Shelda Altes 07/07/2019, 2:01 PM

## 2019-07-07 NOTE — Social Work (Signed)
CSW consulted for transportation needs. CSW visit met with the patient, introduced self and explained role. Patient confirmed he needs transportation home.  Patient signed Rosann Auerbach and release of Lability form- placed in shadow chart.  RN- call [phone] to arrange transportation with [employer] -provide patient's name, MRN number and cost center number   Thurmond Butts, MSW, [place] Social Worker

## 2019-07-07 NOTE — Progress Notes (Signed)
CARDIAC REHAB PHASE I   PRE:  Rate/Rhythm: 79 SR    BP: sitting 144/54    SaO2: 95 RA  MODE:  Ambulation: 400 ft   POST:  Rate/Rhythm: 82 SR    BP: sitting 162/52     SaO2: 97 RA  Pt c/o stool incontinence today. Able to walk hall with RW, no CP or other c/o. BP stable. Discussed diet, exercise, NTG, and CRPII. Pt receptive. He would like a ride from [place] or other transport today. He would like TOC for meds. He doesn't have a way of paying right now if cost but can get card numbers from family if need be. [place], ACSM 07/07/2019 9:40 AM

## 2019-07-07 NOTE — Telephone Encounter (Signed)
New Message    Pt has TOC appt 07/15/19 at 2:20pm

## 2019-07-09 ENCOUNTER — Other Ambulatory Visit: Payer: Self-pay

## 2019-07-09 ENCOUNTER — Other Ambulatory Visit: Payer: Self-pay | Admitting: Student in an Organized Health Care Education/Training Program

## 2019-07-09 MED ORDER — HYDRALAZINE HCL 50 MG PO TABS: 50.0000 mg | ORAL_TABLET | Freq: Three times a day (TID) | ORAL | 0 refills | Status: DC

## 2019-07-09 NOTE — Telephone Encounter (Signed)
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 Pt's medication was sent to the wrong pharmacy. Medication needs to be sent to [facility] in [place], [place]. Please address

## 2019-07-13 ENCOUNTER — Telehealth: Payer: Self-pay

## 2019-07-13 MED ORDER — INSULIN GLARGINE 100 UNIT/ML ~~LOC~~ SOLN: 20.0000 [IU] | Freq: Every day | SUBCUTANEOUS | 3 refills | Status: DC

## 2019-07-13 NOTE — Telephone Encounter (Signed)
Patient's wife contacted the office stating that he needs a refill on his Lantus. This was last refilled 03/11/19 for 34mL with 3 refills. Patient was last seen on 06/15/19 - looks like an oral option for his diabetes was discussed. Patient's wife states he is doing well on inuslin, so they would like to stay on this - but just wanted to check with Dr. Einar Pheasant.  They would like this sent to [facility]  Please advise.

## 2019-07-14 DIAGNOSIS — M545 Low back pain: Secondary | ICD-10-CM | POA: Diagnosis not present

## 2019-07-14 DIAGNOSIS — R413 Other amnesia: Secondary | ICD-10-CM | POA: Diagnosis not present

## 2019-07-14 DIAGNOSIS — R531 Weakness: Secondary | ICD-10-CM | POA: Diagnosis not present

## 2019-07-14 DIAGNOSIS — G8929 Other chronic pain: Secondary | ICD-10-CM | POA: Diagnosis not present

## 2019-07-15 ENCOUNTER — Other Ambulatory Visit: Payer: Self-pay

## 2019-07-15 ENCOUNTER — Emergency Department
Admission: EM | Admit: 2019-07-15 | Discharge: 2019-07-15 | Disposition: A | Discharge status: Home / Self Care | Payer: Medicare Other | Attending: Emergency Medicine | Admitting: Emergency Medicine

## 2019-07-15 ENCOUNTER — Encounter: Payer: Self-pay | Admitting: Nurse Practitioner

## 2019-07-15 ENCOUNTER — Ambulatory Visit (INDEPENDENT_AMBULATORY_CARE_PROVIDER_SITE_OTHER): Payer: Medicare Other | Admitting: Nurse Practitioner

## 2019-07-15 VITALS — BP 70/32 | HR 54 | Ht 66.0 in | Wt 176.0 lb

## 2019-07-15 DIAGNOSIS — E1140 Type 2 diabetes mellitus with diabetic neuropathy, unspecified: Secondary | ICD-10-CM | POA: Insufficient documentation

## 2019-07-15 DIAGNOSIS — N183 Chronic kidney disease, stage 3 unspecified: Secondary | ICD-10-CM | POA: Insufficient documentation

## 2019-07-15 DIAGNOSIS — Z79899 Other long term (current) drug therapy: Secondary | ICD-10-CM | POA: Diagnosis not present

## 2019-07-15 DIAGNOSIS — I2510 Atherosclerotic heart disease of native coronary artery without angina pectoris: Secondary | ICD-10-CM

## 2019-07-15 DIAGNOSIS — I200 Unstable angina: Secondary | ICD-10-CM | POA: Diagnosis not present

## 2019-07-15 DIAGNOSIS — I959 Hypotension, unspecified: Secondary | ICD-10-CM

## 2019-07-15 DIAGNOSIS — R531 Weakness: Secondary | ICD-10-CM | POA: Insufficient documentation

## 2019-07-15 DIAGNOSIS — Z8546 Personal history of malignant neoplasm of prostate: Secondary | ICD-10-CM | POA: Insufficient documentation

## 2019-07-15 DIAGNOSIS — Z7982 Long term (current) use of aspirin: Secondary | ICD-10-CM | POA: Insufficient documentation

## 2019-07-15 DIAGNOSIS — Z794 Long term (current) use of insulin: Secondary | ICD-10-CM | POA: Insufficient documentation

## 2019-07-15 DIAGNOSIS — I252 Old myocardial infarction: Secondary | ICD-10-CM | POA: Diagnosis not present

## 2019-07-15 DIAGNOSIS — E1122 Type 2 diabetes mellitus with diabetic chronic kidney disease: Secondary | ICD-10-CM | POA: Diagnosis not present

## 2019-07-15 DIAGNOSIS — R55 Syncope and collapse: Secondary | ICD-10-CM

## 2019-07-15 DIAGNOSIS — Z87891 Personal history of nicotine dependence: Secondary | ICD-10-CM | POA: Diagnosis not present

## 2019-07-15 DIAGNOSIS — F0390 Unspecified dementia without behavioral disturbance: Secondary | ICD-10-CM | POA: Insufficient documentation

## 2019-07-15 DIAGNOSIS — I129 Hypertensive chronic kidney disease with stage 1 through stage 4 chronic kidney disease, or unspecified chronic kidney disease: Secondary | ICD-10-CM | POA: Insufficient documentation

## 2019-07-15 LAB — CBC
HCT: 30.9 % — ABNORMAL LOW (ref 39.0–52.0)
Hemoglobin: 10 g/dL — ABNORMAL LOW (ref 13.0–17.0)
MCH: 28.7 pg (ref 26.0–34.0)
MCHC: 32.4 g/dL (ref 30.0–36.0)
MCV: 88.5 fL (ref 80.0–100.0)
Platelets: 278 10*3/uL (ref 150–400)
RBC: 3.49 MIL/uL — ABNORMAL LOW (ref 4.22–5.81)
RDW: 14.6 % (ref 11.5–15.5)
WBC: 11.3 10*3/uL — ABNORMAL HIGH (ref 4.0–10.5)
nRBC: 0 % (ref 0.0–0.2)

## 2019-07-15 LAB — BASIC METABOLIC PANEL
Anion gap: 8 (ref 5–15)
BUN: 22 mg/dL (ref 8–23)
CO2: 24 mmol/L (ref 22–32)
Calcium: 8 mg/dL — ABNORMAL LOW (ref 8.9–10.3)
Chloride: 107 mmol/L (ref 98–111)
Creatinine, Ser: 1.55 mg/dL — ABNORMAL HIGH (ref 0.61–1.24)
GFR calc Af Amer: 46 mL/min — ABNORMAL LOW (ref >60)
GFR calc non Af Amer: 40 mL/min — ABNORMAL LOW (ref >60)
Glucose, Bld: 122 mg/dL — ABNORMAL HIGH (ref 70–99)
Potassium: 4.5 mmol/L (ref 3.5–5.1)
Sodium: 139 mmol/L (ref 135–145)

## 2019-07-15 LAB — TROPONIN I (HIGH SENSITIVITY): Troponin I (High Sensitivity): 151 ng/L (ref <18)

## 2019-07-15 MED ORDER — SODIUM CHLORIDE 0.9% FLUSH: 3.0000 mL | Freq: Once | INTRAVENOUS | Status: DC

## 2019-07-15 MED ORDER — ONDANSETRON HCL 4 MG/2ML IJ SOLN
4.0000 mg | Freq: Once | INTRAMUSCULAR | Status: AC
  Administered 2019-07-15: 4 mg via INTRAVENOUS
  Filled 2019-07-15: qty 2

## 2019-07-15 MED ORDER — SODIUM CHLORIDE 0.9 % IV BOLUS
1000.0000 mL | Freq: Once | INTRAVENOUS | Status: AC
  Administered 2019-07-15: 1000 mL via INTRAVENOUS

## 2019-07-15 NOTE — Progress Notes (Signed)
[facility] received rapid response page for pt. in lower level medical arts; [facility] and RR team encountered pt. in waiting area for pulmonary clinic; [place] accompanied pt.'s wife to ED, following pt. on stretcher.  At ED, [facility] helped arrange for wife to stay in family waiting rm. while husband was being evaluated; pt. and wife are from [place], [place]; they moved to [place] to be closer to their grandchildren in [place] and [place] just over a year ago.   Pt. taken to ED rm. and [place] brought wife to wait for MD to assess pt.  Pt. told ED MD he had cardiac stent placed last week; was feeling 'terrible' today and came for follow-up appointment w/cardiologist; while in MD office pt. 'blacked out.'  MD outlined plan to assess pt. in ED via tests, labs, EKG and follow-up.  Wife chose to return home after meeting w/doctor; [facility] assisted her in finding her car.  Pt. is aware of [facility] availability if needed.    07/15/19 1530  Clinical Encounter Type  Visited With Patient and family together;Health care provider  Visit Type Initial;ED;Psychological support;Social support  Referral From Nurse  Spiritual Encounters  Spiritual Needs Emotional  Stress Factors  Patient Stress Factors Loss of control;Major life changes;Health changes;Lack of knowledge  Family Stress Factors Major life changes;Lack of knowledge;Health changes

## 2019-07-15 NOTE — ED Triage Notes (Signed)
Pt comes with c/o near syncopal episode. Pt was outside the cardiac office. Pt states he was sitting and talking with MD when he stated he felt terrible.  Pt states they thought he might be dehydrated. Pt states next thing he knew several people were around him.   Pt denies any CP or SOB.
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
## 2019-07-15 NOTE — ED Notes (Signed)
Pt called out to which this RN went to check on pt. PT voicing disapproval of care and stating that he wants to leave. EDP made aware. Pt to leave AMA.
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
## 2019-07-16 ENCOUNTER — Telehealth: Payer: Self-pay

## 2019-07-16 NOTE — Telephone Encounter (Signed)
Call to patient for update following ED visit yesterday where pt left ama.   Wife reports that he is doing well at this time. No repeat syncope.   Confirmed appt for next Wednesday. I told her I will call if earlier appt opens up.   I advised if he has repeat dizziness, weakness, chest pain, syncope to call 911.   She is agreeable to POC.   Advised pt to call for any further questions or concerns.

## 2019-07-19 DIAGNOSIS — M25561 Pain in right knee: Secondary | ICD-10-CM | POA: Diagnosis not present

## 2019-07-21 ENCOUNTER — Encounter: Payer: Self-pay | Admitting: Nurse Practitioner

## 2019-07-21 ENCOUNTER — Ambulatory Visit (INDEPENDENT_AMBULATORY_CARE_PROVIDER_SITE_OTHER): Payer: Medicare Other | Admitting: Nurse Practitioner

## 2019-07-21 ENCOUNTER — Other Ambulatory Visit: Payer: Self-pay

## 2019-07-21 ENCOUNTER — Other Ambulatory Visit: Payer: Self-pay | Admitting: Orthopedic Surgery

## 2019-07-21 VITALS — BP 128/50 | HR 54 | Ht 68.0 in | Wt 174.2 lb

## 2019-07-21 DIAGNOSIS — I2510 Atherosclerotic heart disease of native coronary artery without angina pectoris: Secondary | ICD-10-CM

## 2019-07-21 DIAGNOSIS — I739 Peripheral vascular disease, unspecified: Secondary | ICD-10-CM | POA: Diagnosis not present

## 2019-07-21 DIAGNOSIS — E785 Hyperlipidemia, unspecified: Secondary | ICD-10-CM | POA: Diagnosis not present

## 2019-07-21 DIAGNOSIS — I200 Unstable angina: Secondary | ICD-10-CM | POA: Diagnosis not present

## 2019-07-21 DIAGNOSIS — I10 Essential (primary) hypertension: Secondary | ICD-10-CM | POA: Diagnosis not present

## 2019-07-21 DIAGNOSIS — M25561 Pain in right knee: Secondary | ICD-10-CM

## 2019-07-21 DIAGNOSIS — N183 Chronic kidney disease, stage 3 unspecified: Secondary | ICD-10-CM

## 2019-07-21 NOTE — Patient Instructions (Signed)
Medication Instructions:  Your physician recommends that you continue on your current medications as directed. Please refer to the Current Medication list given to you today.  *If you need a refill on your cardiac medications before your next appointment, please call your pharmacy*   Lab Work: None ordered  If you have labs (blood work) drawn today and your tests are completely normal, you will receive your results only by: [name] MyChart Message (if you have MyChart) OR . A paper copy in the mail If you have any lab test that is abnormal or we need to change your treatment, we will call you to review the results.   Testing/Procedures: None ordered    Follow-Up: At [facility], you and your health needs are our priority.  As part of our continuing mission to provide you with exceptional heart care, we have created designated Provider Care Teams.  These Care Teams include your primary Cardiologist (physician) and Advanced Practice Providers (APPs -  Physician Assistants and Nurse Practitioners) who all work together to provide you with the care you need, when you need it.  We recommend signing up for the patient portal called "MyChart".  Sign up information is provided on this After Visit Summary.  MyChart is used to connect with patients for Virtual Visits (Telemedicine).  Patients are able to view lab/test results, encounter notes, upcoming appointments, etc.  Non-urgent messages can be sent to your provider as well.   To learn more about what you can do with MyChart, go to [email or web address].    Your next appointment:   6 week(s)  The format for your next appointment:   In Person  Provider:    You may see [name], MD or Murray Hodgkins, NP
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
## 2019-07-27 ENCOUNTER — Other Ambulatory Visit: Payer: Self-pay

## 2019-07-27 ENCOUNTER — Encounter: Payer: Medicare Other | Attending: Internal Medicine | Admitting: *Deleted

## 2019-07-27 DIAGNOSIS — Z955 Presence of coronary angioplasty implant and graft: Secondary | ICD-10-CM

## 2019-07-27 DIAGNOSIS — I10 Essential (primary) hypertension: Secondary | ICD-10-CM | POA: Diagnosis not present

## 2019-07-27 DIAGNOSIS — M48061 Spinal stenosis, lumbar region without neurogenic claudication: Secondary | ICD-10-CM | POA: Diagnosis not present

## 2019-07-27 DIAGNOSIS — M47816 Spondylosis without myelopathy or radiculopathy, lumbar region: Secondary | ICD-10-CM | POA: Diagnosis not present

## 2019-07-27 NOTE — Progress Notes (Signed)
Completed virtual orientation today.  EP evaluation is scheduled for Thursday 6/17 at 11am.  Documentation for diagnosis can be found in [facility] encounter 07/05/19.

## 2019-07-29 ENCOUNTER — Other Ambulatory Visit: Payer: Self-pay

## 2019-07-29 ENCOUNTER — Encounter: Payer: Medicare Other | Admitting: *Deleted

## 2019-07-29 VITALS — Ht 67.5 in | Wt 171.3 lb

## 2019-07-29 DIAGNOSIS — Z955 Presence of coronary angioplasty implant and graft: Secondary | ICD-10-CM | POA: Diagnosis present

## 2019-07-29 NOTE — Patient Instructions (Signed)
Patient Instructions  Patient Details  Name: Peter [name] MRN: [id number] Date of Birth: 1930/09/18 Referring Provider:  [name], MD  Below are your personal goals for exercise, nutrition, and risk factors. Our goal is to help you stay on track towards obtaining and maintaining these goals. We will be discussing your progress on these goals with you throughout the program.  Initial Exercise Prescription:  Initial Exercise Prescription - 07/29/19 1200      Date of Initial Exercise RX and Referring Provider   Date 07/29/19    Referring Provider End, Harrell Gave MD      Treadmill   MPH 1.5    Grade 0    Minutes 15    METs 2.15      Recumbant Elliptical   Level 1    RPM 50    Minutes 15    METs 2      REL-XR   Level 1    Speed 50    Minutes 15    METs 2      Prescription Details   Frequency (times per week) 2    Duration Progress to 30 minutes of continuous aerobic without signs/symptoms of physical distress      Intensity   THRR 40-80% of Max Heartrate 84-117    Ratings of Perceived Exertion 11-13    Perceived Dyspnea 0-4      Progression   Progression Continue to progress workloads to maintain intensity without signs/symptoms of physical distress.      Resistance Training   Training Prescription Yes    Weight 3 lbs    Reps 10-15           Exercise Goals: Frequency: Be able to perform aerobic exercise two to three times per week in program working toward 2-5 days per week of home exercise.  Intensity: Work with a perceived exertion of 11 (fairly light) - 15 (hard) while following your exercise prescription.  We will make changes to your prescription with you as you progress through the program.   Duration: Be able to do 30 to 45 minutes of continuous aerobic exercise in addition to a 5 minute warm-up and a 5 minute cool-down routine.   Nutrition Goals: Your personal nutrition goals will be established when you do your nutrition analysis with the  dietician.  The following are general nutrition guidelines to follow: Cholesterol < 200mg /day Sodium < 1500mg /day Fiber: Men over 50 yrs - 30 grams per day  Personal Goals:  Personal Goals and Risk Factors at Admission - 07/29/19 1311      Core Components/Risk Factors/Patient Goals on Admission    Weight Management Yes;Weight Loss;Obesity    Intervention Weight Management: Develop a combined nutrition and exercise program designed to reach desired caloric intake, while maintaining appropriate intake of nutrient and fiber, sodium and fats, and appropriate energy expenditure required for the weight goal.;Weight Management: Provide education and appropriate resources to help participant work on and attain dietary goals.;Weight Management/Obesity: Establish reasonable short term and long term weight goals.;Obesity: Provide education and appropriate resources to help participant work on and attain dietary goals.    Admit Weight 171 lb 4.8 oz (77.7 kg)    Goal Weight: Short Term 165 lb (74.8 kg)    Goal Weight: Long Term 160 lb (72.6 kg)    Expected Outcomes Short Term: Continue to assess and modify interventions until short term weight is achieved;Long Term: Adherence to nutrition and physical activity/exercise program aimed toward attainment of established weight goal;Weight Loss: Understanding  of general recommendations for a balanced deficit meal plan, which promotes 1-2 lb weight loss per week and includes a negative energy balance of [phone] kcal/d;Understanding recommendations for meals to include 15-35% energy as protein, 25-35% energy from fat, 35-60% energy from carbohydrates, less than 200mg  of dietary cholesterol, 20-35 gm of total fiber daily;Understanding of distribution of calorie intake throughout the day with the consumption of 4-5 meals/snacks    Diabetes Yes    Intervention Provide education about signs/symptoms and action to take for hypo/hyperglycemia.;Provide education about proper  nutrition, including hydration, and aerobic/resistive exercise prescription along with prescribed medications to achieve blood glucose in normal ranges: Fasting glucose 65-99 mg/dL    Expected Outcomes Short Term: Participant verbalizes understanding of the signs/symptoms and immediate care of hyper/hypoglycemia, proper foot care and importance of medication, aerobic/resistive exercise and nutrition plan for blood glucose control.;Long Term: Attainment of HbA1C < 7%.    Heart Failure Yes    Intervention Provide a combined exercise and nutrition program that is supplemented with education, support and counseling about heart failure. Directed toward relieving symptoms such as shortness of breath, decreased exercise tolerance, and extremity edema.    Expected Outcomes Improve functional capacity of life;Short term: Attendance in program 2-3 days a week with increased exercise capacity. Reported lower sodium intake. Reported increased fruit and vegetable intake. Reports medication compliance.;Short term: Daily weights obtained and reported for increase. Utilizing diuretic protocols set by physician.;Long term: Adoption of self-care skills and reduction of barriers for early signs and symptoms recognition and intervention leading to self-care maintenance.    Hypertension Yes    Intervention Provide education on lifestyle modifcations including regular physical activity/exercise, weight management, moderate sodium restriction and increased consumption of fresh fruit, vegetables, and low fat dairy, alcohol moderation, and smoking cessation.;Monitor prescription use compliance.    Expected Outcomes Short Term: Continued assessment and intervention until BP is < 140/48mm HG in hypertensive participants. < 130/65mm HG in hypertensive participants with diabetes, heart failure or chronic kidney disease.;Long Term: Maintenance of blood pressure at goal levels.    Lipids Yes    Intervention Provide education and support  for participant on nutrition & aerobic/resistive exercise along with prescribed medications to achieve LDL 70mg , HDL >40mg .    Expected Outcomes Short Term: Participant states understanding of desired cholesterol values and is compliant with medications prescribed. Participant is following exercise prescription and nutrition guidelines.;Long Term: Cholesterol controlled with medications as prescribed, with individualized exercise RX and with personalized nutrition plan. Value goals: LDL < 70mg , HDL > 40 mg.           Tobacco Use Initial Evaluation: Social History   Tobacco Use  Smoking Status Former Smoker  . Packs/day: 0.75  . Years: 12.00  . Pack years: 9.00  . Types: Cigars, Cigarettes  . Quit date: 49  . Years since quitting: 51.4  Smokeless Tobacco Former Systems developer  . Types: Chew  . Quit date: 2016    Exercise Goals and Review:  Exercise Goals    Row Name 07/29/19 1309             Exercise Goals   Increase Physical Activity Yes       Intervention Provide advice, education, support and counseling about physical activity/exercise needs.;Develop an individualized exercise prescription for aerobic and resistive training based on initial evaluation findings, risk stratification, comorbidities and participant's personal goals.       Expected Outcomes Short Term: Attend rehab on a regular basis to increase amount of physical activity.;Long  Term: Add in home exercise to make exercise part of routine and to increase amount of physical activity.;Long Term: Exercising regularly at least 3-5 days a week.       Increase Strength and Stamina Yes       Intervention Provide advice, education, support and counseling about physical activity/exercise needs.;Develop an individualized exercise prescription for aerobic and resistive training based on initial evaluation findings, risk stratification, comorbidities and participant's personal goals.       Expected Outcomes Short Term: Increase  workloads from initial exercise prescription for resistance, speed, and METs.;Short Term: Perform resistance training exercises routinely during rehab and add in resistance training at home;Long Term: Improve cardiorespiratory fitness, muscular endurance and strength as measured by increased METs and functional capacity (6MWT)       Able to understand and use rate of perceived exertion (RPE) scale Yes       Intervention Provide education and explanation on how to use RPE scale       Expected Outcomes Short Term: Able to use RPE daily in rehab to express subjective intensity level;Long Term:  Able to use RPE to guide intensity level when exercising independently       Able to understand and use Dyspnea scale Yes       Intervention Provide education and explanation on how to use Dyspnea scale       Expected Outcomes Short Term: Able to use Dyspnea scale daily in rehab to express subjective sense of shortness of breath during exertion;Long Term: Able to use Dyspnea scale to guide intensity level when exercising independently       Knowledge and understanding of Target Heart Rate Range (THRR) Yes       Intervention Provide education and explanation of THRR including how the numbers were predicted and where they are located for reference       Expected Outcomes Short Term: Able to state/look up THRR;Short Term: Able to use daily as guideline for intensity in rehab;Long Term: Able to use THRR to govern intensity when exercising independently       Able to check pulse independently Yes       Intervention Provide education and demonstration on how to check pulse in carotid and radial arteries.;Review the importance of being able to check your own pulse for safety during independent exercise       Expected Outcomes Short Term: Able to explain why pulse checking is important during independent exercise;Long Term: Able to check pulse independently and accurately       Understanding of Exercise Prescription Yes        Intervention Provide education, explanation, and written materials on patient's individual exercise prescription       Expected Outcomes Short Term: Able to explain program exercise prescription;Long Term: Able to explain home exercise prescription to exercise independently              Copy of goals given to participant.

## 2019-07-29 NOTE — Progress Notes (Signed)
Cardiac Individual Treatment Plan  Patient Details  Name: Peter [name] MRN: [id number] Date of Birth: Feb 06, 1931 Referring Provider:     Cardiac Rehab from 07/29/2019 in [facility] Cardiac and Pulmonary Rehab  Referring Provider End, Harrell Gave MD      Initial Encounter Date:    Cardiac Rehab from 07/29/2019 in [facility] Cardiac and Pulmonary Rehab  Date 07/29/19      Visit Diagnosis: Status post coronary artery stent placement  Patient's Home Medications on Admission:  Current Outpatient Medications:  .  allopurinol (ZYLOPRIM) 100 MG tablet, TAKE 1 TABLET BY MOUTH EVERY DAY (Patient taking differently: Take 100 mg by mouth daily. ), Disp: 90 tablet, Rfl: 2 .  amLODipine (NORVASC) 10 MG tablet, TAKE 1 TABLET BY MOUTH EVERY DAY (Patient taking differently: Take 10 mg by mouth daily. ), Disp: 90 tablet, Rfl: 2 .  Ascorbic Acid (VITAMIN C) 1000 MG tablet, Take 1,000 mg by mouth daily., Disp: , Rfl:  .  aspirin EC 81 MG tablet, Take 81 mg by mouth daily., Disp: , Rfl:  .  atorvastatin (LIPITOR) 80 MG tablet, Take 1 tablet (80 mg total) by mouth daily., Disp: 90 tablet, Rfl: 0 .  beta carotene 25000 UNIT capsule, Take 25,000 Units by mouth daily., Disp: , Rfl:  .  carvedilol (COREG) 6.25 MG tablet, Take 1 tablet (6.25 mg total) by mouth 2 (two) times daily with a meal., Disp: 180 tablet, Rfl: 0 .  clopidogrel (PLAVIX) 75 MG tablet, Take 1 tablet (75 mg total) by mouth daily., Disp: 90 tablet, Rfl: 3 .  donepezil (ARICEPT) 10 MG tablet, TAKE 1 TABLET BY MOUTH EVERYDAY AT BEDTIME (Patient taking differently: Take 10 mg by mouth at bedtime. ), Disp: 90 tablet, Rfl: 2 .  doxazosin (CARDURA) 4 MG tablet, Take 1 tablet (4 mg total) by mouth at bedtime., Disp: 90 tablet, Rfl: 2 .  ferrous sulfate 325 (65 FE) MG tablet, TAKE 1 TABLET BY MOUTH EVERY DAY (Patient taking differently: Take 325 mg by mouth daily. ), Disp: 90 tablet, Rfl: 1 .  finasteride (PROSCAR) 5 MG tablet, Take 5 mg by mouth daily., Disp:  , Rfl:  .  Flaxseed, Linseed, (FLAX SEED OIL) 1000 MG CAPS, Take 1,000 mg by mouth 2 (two) times a week. , Disp: , Rfl:  .  folic acid (FOLVITE) 277 MCG tablet, Take 400 mcg by mouth 2 (two) times a week. , Disp: , Rfl:  .  gabapentin (NEURONTIN) 300 MG capsule, Take 1 capsule (300 mg total) by mouth 2 (two) times daily., Disp: 60 capsule, Rfl: 5 .  hydrALAZINE (APRESOLINE) 50 MG tablet, Take 1 tablet (50 mg total) by mouth every 8 (eight) hours., Disp: 90 tablet, Rfl: 0 .  insulin glargine (LANTUS) 100 UNIT/ML injection, Inject 0.2 mLs (20 Units total) into the skin at bedtime., Disp: 20 mL, Rfl: 3 .  Insulin Syringe-Needle U-100 (TRUEPLUS INSULIN SYRINGE) 31G X 5/16" 1 ML MISC, Use daily as directed. Dispense needles as prescribed in the past. DX: E11.22, Disp: 300 each, Rfl: 11 .  isosorbide mononitrate (IMDUR) 60 MG 24 hr tablet, Take 1 tablet (60 mg total) by mouth daily., Disp: 90 tablet, Rfl: 0 .  loperamide (IMODIUM) 2 MG capsule, Take 1 capsule (2 mg total) by mouth every 4 (four) hours as needed for diarrhea or loose stools., Disp: 30 capsule, Rfl: 0 .  losartan (COZAAR) 100 MG tablet, Take 100 mg by mouth daily., Disp: , Rfl:  .  memantine (NAMENDA) 10 MG tablet,  TAKE 1 TABLET BY MOUTH TWICE A DAY (Patient taking differently: Take 10 mg by mouth 2 (two) times daily. ), Disp: 180 tablet, Rfl: 1 .  nitroGLYCERIN (NITROSTAT) 0.4 MG SL tablet, Place 1 tablet (0.4 mg total) under the tongue every 5 (five) minutes x 3 doses as needed for chest pain., Disp: 25 tablet, Rfl: 1 .  Omega-3 Fatty Acids (FISH OIL) 500 MG CAPS, Take 500 mg by mouth 2 (two) times a week. , Disp: , Rfl:  .  selenium 50 MCG TABS tablet, Take 50 mcg by mouth 2 (two) times a week. , Disp: , Rfl:  .  sertraline (ZOLOFT) 25 MG tablet, TAKE 1 TABLET BY MOUTH EVERY DAY (Patient taking differently: Take 25 mg by mouth daily. ), Disp: 90 tablet, Rfl: 2  Past Medical History: Past Medical History:  Diagnosis Date  . Allergy   .  Arthritis   . Carpal tunnel syndrome   . CKD (chronic kidney disease), stage III   . Coronary artery disease    a. 03/2018 PCI [facility]): RCA 95p, 53m (2.5x38 [place] DES & 2.5x23 [place] DES); b. 06/2019 PCI: LM 40ost, LAD 85p, 60p/m (atherectomy w/ 3.0x48 Synergy DES p/m LAD), 64m, LCX 50d, OM2/3 50, RCA patent prox/mid stent, 50d, RPDA 50.  . Diabetes mellitus without complication (HCC)   . Diastolic dysfunction    a. 06/2019 Echo: EF 60-65%, no rwma, mod LVH, Gr1 DD. Nl RV fxn. Mildly dil LA. Mild-mod TR.  . Diverticulitis   . GERD (gastroesophageal reflux disease)   . Gout   . History of blood transfusion   . History of chicken pox   . Hyperlipidemia   . Hypertension   . Mild dementia (HCC)   . Myocardial infarction (HCC) 03/2018  . Normocytic anemia    a. 06/2019 s/p 1u PRBCs.  [name] PAD (peripheral artery disease) (HCC)    a. 11/2018 s/p R SFA, popliteal, and peroneal artery PTA.  . Prostate cancer [facility])    prostate  . Prostate cancer (HCC)   . Syncope     Tobacco Use: Social History   Tobacco Use  Smoking Status Former Smoker  . Packs/day: 0.75  . Years: 12.00  . Pack years: 9.00  . Types: Cigars, Cigarettes  . Quit date: 20  . Years since quitting: 51.4  Smokeless Tobacco Former Neurosurgeon  . Types: Chew  . Quit date: 2016    Labs: Recent Review Flowsheet Data    Labs for ITP Cardiac and Pulmonary Rehab Latest Ref Rng & Units 09/30/2018 10/05/2018 06/15/2019 06/27/2019 07/05/2019   Cholestrol 0 - 200 mg/dL - 890 - 912 -   LDLCALC 0 - 99 mg/dL - 47 - 62 -   HDL >71 mg/dL - 58 - 61 -   Trlycerides <150 mg/dL - 55 - 62 -   Hemoglobin A1c 4.6 - 6.5 % 6.2(A) - 6.7(H) - -   TCO2 22 - 32 mmol/L - - - - 22       Exercise Target Goals: Exercise Program Goal: Individual exercise prescription set using results from initial 6 min walk test and THRR while considering  patient's activity barriers and safety.   Exercise Prescription Goal: Initial exercise prescription builds to 30-45  minutes a day of aerobic activity, 2-3 days per week.  Home exercise guidelines will be given to patient during program as part of exercise prescription that the participant will acknowledge.   Education: Aerobic Exercise & Resistance Training: - Gives group verbal and written  instruction on the various components of exercise. Focuses on aerobic and resistive training programs and the benefits of this training and how to safely progress through these programs..   Education: Exercise & Equipment Safety: - Individual verbal instruction and demonstration of equipment use and safety with use of the equipment.   Cardiac Rehab from 07/29/2019 in [facility] Cardiac and Pulmonary Rehab  Date 07/29/19  Educator [facility]  Instruction Review Code 1- Verbalizes Understanding      Education: Exercise Physiology & General Exercise Guidelines: - Group verbal and written instruction with models to review the exercise physiology of the cardiovascular system and associated critical values. Provides general exercise guidelines with specific guidelines to those with heart or lung disease.    Education: Flexibility, Balance, Mind/Body Relaxation: Provides group verbal/written instruction on the benefits of flexibility and balance training, including mind/body exercise modes such as yoga, pilates and tai chi.  Demonstration and skill practice provided.   Activity Barriers & Risk Stratification:  Activity Barriers & Cardiac Risk Stratification - 07/29/19 1254      Activity Barriers & Cardiac Risk Stratification   Activity Barriers Arthritis;Deconditioning;Muscular Weakness;Shortness of Breath;Joint Problems;Back Problems;Neck/Spine Problems;Balance Concerns;Other (comment)    Comments multiple old injuries, pain management, knees/back/hips/shoulders, r foot drags    Cardiac Risk Stratification Moderate           6 Minute Walk:  6 Minute Walk    Row Name 07/29/19 1249         6 Minute Walk   Phase Initial      Distance 740 feet     Walk Time 5 minutes     # of Rest Breaks 1  58mn     MPH 1.62     METS 0.92     RPE 13     Perceived Dyspnea  2     VO2 Peak 3.21     Symptoms Yes (comment)     Comments SOB, walking into walls, unsteady step     Resting HR 51 bpm     Resting BP 120/64     Resting Oxygen Saturation  99 %     Exercise Oxygen Saturation  during 6 min walk 97 %     Max Ex. HR 73 bpm     Max Ex. BP 152/80     2 Minute Post BP 126/70            Oxygen Initial Assessment:   Oxygen Re-Evaluation:   Oxygen Discharge (Final Oxygen Re-Evaluation):   Initial Exercise Prescription:  Initial Exercise Prescription - 07/29/19 1200      Date of Initial Exercise RX and Referring Provider   Date 07/29/19    Referring Provider End, CHarrell GaveMD      Treadmill   MPH 1.5    Grade 0    Minutes 15    METs 2.15      Recumbant Elliptical   Level 1    RPM 50    Minutes 15    METs 2      REL-XR   Level 1    Speed 50    Minutes 15    METs 2      Prescription Details   Frequency (times per week) 2    Duration Progress to 30 minutes of continuous aerobic without signs/symptoms of physical distress      Intensity   THRR 40-80% of Max Heartrate 84-117    Ratings of Perceived Exertion 11-13    Perceived Dyspnea 0-4  Progression   Progression Continue to progress workloads to maintain intensity without signs/symptoms of physical distress.      Resistance Training   Training Prescription Yes    Weight 3 lbs    Reps 10-15           Perform Capillary Blood Glucose checks as needed.  Exercise Prescription Changes:  Exercise Prescription Changes    Row Name 07/29/19 1200             Response to Exercise   Blood Pressure (Admit) 120/64       Blood Pressure (Exercise) 152/80       Blood Pressure (Exit) 126/70       Heart Rate (Admit) 51 bpm       Heart Rate (Exercise) 73 bpm       Heart Rate (Exit) 52 bpm       Oxygen Saturation (Admit) 99 %        Oxygen Saturation (Exercise) 97 %       Rating of Perceived Exertion (Exercise) 13       Perceived Dyspnea (Exercise) 2       Symptoms SOB, walking into walls, wobbly gait       Comments walk test results              Exercise Comments:   Exercise Goals and Review:  Exercise Goals    Row Name 07/29/19 1309             Exercise Goals   Increase Physical Activity Yes       Intervention Provide advice, education, support and counseling about physical activity/exercise needs.;Develop an individualized exercise prescription for aerobic and resistive training based on initial evaluation findings, risk stratification, comorbidities and participant's personal goals.       Expected Outcomes Short Term: Attend rehab on a regular basis to increase amount of physical activity.;Long Term: Add in home exercise to make exercise part of routine and to increase amount of physical activity.;Long Term: Exercising regularly at least 3-5 days a week.       Increase Strength and Stamina Yes       Intervention Provide advice, education, support and counseling about physical activity/exercise needs.;Develop an individualized exercise prescription for aerobic and resistive training based on initial evaluation findings, risk stratification, comorbidities and participant's personal goals.       Expected Outcomes Short Term: Increase workloads from initial exercise prescription for resistance, speed, and METs.;Short Term: Perform resistance training exercises routinely during rehab and add in resistance training at home;Long Term: Improve cardiorespiratory fitness, muscular endurance and strength as measured by increased METs and functional capacity (6MWT)       Able to understand and use rate of perceived exertion (RPE) scale Yes       Intervention Provide education and explanation on how to use RPE scale       Expected Outcomes Short Term: Able to use RPE daily in rehab to express subjective intensity level;Long  Term:  Able to use RPE to guide intensity level when exercising independently       Able to understand and use Dyspnea scale Yes       Intervention Provide education and explanation on how to use Dyspnea scale       Expected Outcomes Short Term: Able to use Dyspnea scale daily in rehab to express subjective sense of shortness of breath during exertion;Long Term: Able to use Dyspnea scale to guide intensity level when exercising independently  Knowledge and understanding of Target Heart Rate Range (THRR) Yes       Intervention Provide education and explanation of THRR including how the numbers were predicted and where they are located for reference       Expected Outcomes Short Term: Able to state/look up THRR;Short Term: Able to use daily as guideline for intensity in rehab;Long Term: Able to use THRR to govern intensity when exercising independently       Able to check pulse independently Yes       Intervention Provide education and demonstration on how to check pulse in carotid and radial arteries.;Review the importance of being able to check your own pulse for safety during independent exercise       Expected Outcomes Short Term: Able to explain why pulse checking is important during independent exercise;Long Term: Able to check pulse independently and accurately       Understanding of Exercise Prescription Yes       Intervention Provide education, explanation, and written materials on patient's individual exercise prescription       Expected Outcomes Short Term: Able to explain program exercise prescription;Long Term: Able to explain home exercise prescription to exercise independently              Exercise Goals Re-Evaluation :   Discharge Exercise Prescription (Final Exercise Prescription Changes):  Exercise Prescription Changes - 07/29/19 1200      Response to Exercise   Blood Pressure (Admit) 120/64    Blood Pressure (Exercise) 152/80    Blood Pressure (Exit) 126/70    Heart  Rate (Admit) 51 bpm    Heart Rate (Exercise) 73 bpm    Heart Rate (Exit) 52 bpm    Oxygen Saturation (Admit) 99 %    Oxygen Saturation (Exercise) 97 %    Rating of Perceived Exertion (Exercise) 13    Perceived Dyspnea (Exercise) 2    Symptoms SOB, walking into walls, wobbly gait    Comments walk test results           Nutrition:  Target Goals: Understanding of nutrition guidelines, daily intake of sodium '1500mg'$ , cholesterol '200mg'$ , calories 30% from fat and 7% or less from saturated fats, daily to have 5 or more servings of fruits and vegetables.  Education: Controlling Sodium/Reading Food Labels -Group verbal and written material supporting the discussion of sodium use in heart healthy nutrition. Review and explanation with models, verbal and written materials for utilization of the food label.   Education: General Nutrition Guidelines/Fats and Fiber: -Group instruction provided by verbal, written material, models and posters to present the general guidelines for heart healthy nutrition. Gives an explanation and review of dietary fats and fiber.   Biometrics:  Pre Biometrics - 07/29/19 1309      Pre Biometrics   Height 5' 7.5" (1.715 m)    Weight 171 lb 4.8 oz (77.7 kg)    BMI (Calculated) 26.42    Single Leg Stand 1.5 seconds            Nutrition Therapy Plan and Nutrition Goals:   Nutrition Assessments:  Nutrition Assessments - 07/29/19 1310      MEDFICTS Scores   Pre Score 57           MEDIFICTS Score Key:          ?70 Need to make dietary changes          40-70 Heart Healthy Diet         ? 40 Therapeutic Level Cholesterol  Diet  Nutrition Goals Re-Evaluation:   Nutrition Goals Discharge (Final Nutrition Goals Re-Evaluation):   Psychosocial: Target Goals: Acknowledge presence or absence of significant depression and/or stress, maximize coping skills, provide positive support system. Participant is able to verbalize types and ability to use  techniques and skills needed for reducing stress and depression.   Education: Depression - Provides group verbal and written instruction on the correlation between heart/lung disease and depressed mood, treatment options, and the stigmas associated with seeking treatment.   Education: Sleep Hygiene -Provides group verbal and written instruction about how sleep can affect your health.  Define sleep hygiene, discuss sleep cycles and impact of sleep habits. Review good sleep hygiene tips.     Education: Stress and Anxiety: - Provides group verbal and written instruction about the health risks of elevated stress and causes of high stress.  Discuss the correlation between heart/lung disease and anxiety and treatment options. Review healthy ways to manage with stress and anxiety.    Initial Review & Psychosocial Screening:  Initial Psych Review & Screening - 07/27/19 1415      Initial Review   Current issues with Current Stress Concerns;Current Anxiety/Panic;Current Sleep Concerns    Source of Stress Concerns Chronic Illness;Occupation;Unable to participate in former interests or hobbies;Unable to perform yard/household activities    Comments "Angry" about being retired and not getting out, a little anxiety and interest in wanting to get out and physcially in bad shape, most night gets about 6 hours (wakes at 130am and up for about an hour)      [place]? Yes   wife, daughter live 10-43mn away, [place] with families of own)     Barriers   Psychosocial barriers to participate in program Psychosocial barriers identified (see note);The patient should benefit from training in stress management and relaxation.      Screening Interventions   Interventions Encouraged to exercise;To provide support and resources with identified psychosocial needs;Provide feedback about the scores to participant    Expected Outcomes Short Term goal: Utilizing  psychosocial counselor, staff and physician to assist with identification of specific Stressors or current issues interfering with healing process. Setting desired goal for each stressor or current issue identified.;Long Term Goal: Stressors or current issues are controlled or eliminated.;Short Term goal: Identification and review with participant of any Quality of Life or Depression concerns found by scoring the questionnaire.;Long Term goal: The participant improves quality of Life and PHQ9 Scores as seen by post scores and/or verbalization of changes           Quality of Life Scores:   Quality of Life - 07/29/19 1310      Quality of Life   Select Quality of Life      Quality of Life Scores   Health/Function Pre 19.03 %    Socioeconomic Pre 25.56 %    Psych/Spiritual Pre 28.57 %    Family Pre 24.7 %    GLOBAL Pre 23.7 %          Scores of 19 and below usually indicate a poorer quality of life in these areas.  A difference of  2-3 points is a clinically meaningful difference.  A difference of 2-3 points in the total score of the Quality of Life Index has been associated with significant improvement in overall quality of life, self-image, physical symptoms, and general health in studies assessing change in quality of life.  PHQ-9: Recent Review Flowsheet Data  Depression screen [facility] 2/9 07/29/2019 05/31/2019 12/28/2018   Decreased Interest 0 0 0   Down, Depressed, Hopeless 1 0 0   PHQ - 2 Score 1 0 0   Altered sleeping 3 0 -   Tired, decreased energy 3 0 -   Change in appetite 1 0 -   Feeling bad or failure about yourself  1 0 -   Trouble concentrating 0 0 -   Moving slowly or fidgety/restless 1 0 -   Suicidal thoughts 0 0 -   PHQ-9 Score 10 0 -   Difficult doing work/chores Somewhat difficult Not difficult at all -     Interpretation of Total Score  Total Score Depression Severity:  1-4 = Minimal depression, 5-9 = Mild depression, 10-14 = Moderate depression, 15-19 =  Moderately severe depression, 20-27 = Severe depression   Psychosocial Evaluation and Intervention:  Psychosocial Evaluation - 07/27/19 1431      Psychosocial Evaluation & Interventions   Interventions Stress management education;Relaxation education;Encouraged to exercise with the program and follow exercise prescription    Comments [name] is coming into cardiac rehab after having another stent placed.  He had two put in last May as well.  His wife is encouraging his attendance, but he is apprehensive as he has not had a lot of success with previous PT rehabs.  He has a history of mulitple injuries that have caused chronic pain issues.  He does have some anxiety issues and does not sleep the best, but he is a straight forward person and tells you how he feels.  His main motivation for coming to rehab is his wife wants him to do it.  She is his main support but they also have daughters that live nearby too. He would like to prevent any future heart events and move around better.  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    Expected Outcomes Short: Attend rehab to build strength back up Long: Continue to improve overall well being.    Continue Psychosocial Services  Follow up required by staff           Psychosocial Re-Evaluation:   Psychosocial Discharge (Final Psychosocial Re-Evaluation):   Vocational Rehabilitation: Provide vocational rehab assistance to qualifying candidates.   Vocational Rehab Evaluation & Intervention:  Vocational Rehab - 07/27/19 1415      Initial Vocational Rehab Evaluation & Intervention   Assessment shows need for Vocational Rehabilitation No   retired          Education: Education Goals: Education classes will be provided on a variety of topics geared toward better understanding of heart health and risk factor modification. Participant will state  understanding/return demonstration of topics presented as noted by education test scores.  Learning Barriers/Preferences:  Learning Barriers/Preferences - 07/27/19 1414      Learning Barriers/Preferences   Learning Barriers Sight   glasses for reading   Learning Preferences Video;Audio;Written Material;Verbal Instruction           General Cardiac Education Topics:  AED/CPR: - Group verbal and written instruction with the use of models to demonstrate the basic use of the AED with the basic ABC's of resuscitation.   Anatomy & Physiology of the Heart: - Group verbal and written instruction and models provide basic cardiac anatomy and physiology, with the coronary electrical and arterial systems. Review of Valvular disease and Heart Failure  Cardiac Procedures: - Group verbal and written instruction to review commonly prescribed medications for heart disease. Reviews the medication, class of the drug, and side effects. Includes the steps to properly store meds and maintain the prescription regimen. (beta blockers and nitrates)   Cardiac Medications I: - Group verbal and written instruction to review commonly prescribed medications for heart disease. Reviews the medication, class of the drug, and side effects. Includes the steps to properly store meds and maintain the prescription regimen.   Cardiac Medications II: -Group verbal and written instruction to review commonly prescribed medications for heart disease. Reviews the medication, class of the drug, and side effects. (all other drug classes)    Go Sex-Intimacy & Heart Disease, Get SMART - Goal Setting: - Group verbal and written instruction through game format to discuss heart disease and the return to sexual intimacy. Provides group verbal and written material to discuss and apply goal setting through the application of the S.M.A.R.T. Method.   Other Matters of the Heart: - Provides group verbal, written materials and  models to describe Stable Angina and Peripheral Artery. Includes description of the disease process and treatment options available to the cardiac patient.   Infection Prevention: - Provides verbal and written material to individual with discussion of infection control including proper hand washing and proper equipment cleaning during exercise session.   Cardiac Rehab from 07/29/2019 in [facility] Cardiac and Pulmonary Rehab  Date 07/29/19  Educator [facility]  Instruction Review Code 1- Verbalizes Understanding      Falls Prevention: - Provides verbal and written material to individual with discussion of falls prevention and safety.   Cardiac Rehab from 07/29/2019 in [facility] Cardiac and Pulmonary Rehab  Date 07/29/19  Educator [facility]  Instruction Review Code 1- Verbalizes Understanding      Other: -Provides group and verbal instruction on various topics (see comments)   Knowledge Questionnaire Score:  Knowledge Questionnaire Score - 07/29/19 1310      Knowledge Questionnaire Score   Pre Score 22/26 Education Focus: Exercise and Nutrition           Core Components/Risk Factors/Patient Goals at Admission:  Personal Goals and Risk Factors at Admission - 07/29/19 1311      Core Components/Risk Factors/Patient Goals on Admission    Weight Management Yes;Weight Loss;Obesity    Intervention Weight Management: Develop a combined nutrition and exercise program designed to reach desired caloric intake, while maintaining appropriate intake of nutrient and fiber, sodium and fats, and appropriate energy expenditure required for the weight goal.;Weight Management: Provide education and appropriate resources to help participant work on and attain dietary goals.;Weight Management/Obesity: Establish reasonable short term and long term weight goals.;Obesity: Provide education and appropriate resources to help participant work on and attain dietary goals.    Admit Weight 171 lb 4.8 oz (77.7 kg)    Goal Weight:  Short Term 165 lb (74.8 kg)    Goal Weight: Long Term 160 lb (72.6 kg)    Expected Outcomes Short Term: Continue to assess and modify interventions until short term weight is achieved;Long Term: Adherence to nutrition and physical activity/exercise program aimed toward attainment of established weight goal;Weight Loss: Understanding of general recommendations for a balanced deficit meal plan, which promotes 1-2 lb weight loss per week and includes a negative energy balance of [phone] kcal/d;Understanding recommendations for meals to include 15-35% energy as protein, 25-35% energy from fat, 35-60% energy from carbohydrates, less than '200mg'$  of dietary cholesterol, 20-35 gm of total fiber daily;Understanding of distribution of  calorie intake throughout the day with the consumption of 4-5 meals/snacks    Diabetes Yes    Intervention Provide education about signs/symptoms and action to take for hypo/hyperglycemia.;Provide education about proper nutrition, including hydration, and aerobic/resistive exercise prescription along with prescribed medications to achieve blood glucose in normal ranges: Fasting glucose 65-99 mg/dL    Expected Outcomes Short Term: Participant verbalizes understanding of the signs/symptoms and immediate care of hyper/hypoglycemia, proper foot care and importance of medication, aerobic/resistive exercise and nutrition plan for blood glucose control.;Long Term: Attainment of HbA1C < 7%.    Heart Failure Yes    Intervention Provide a combined exercise and nutrition program that is supplemented with education, support and counseling about heart failure. Directed toward relieving symptoms such as shortness of breath, decreased exercise tolerance, and extremity edema.    Expected Outcomes Improve functional capacity of life;Short term: Attendance in program 2-3 days a week with increased exercise capacity. Reported lower sodium intake. Reported increased fruit and vegetable intake. Reports  medication compliance.;Short term: Daily weights obtained and reported for increase. Utilizing diuretic protocols set by physician.;Long term: Adoption of self-care skills and reduction of barriers for early signs and symptoms recognition and intervention leading to self-care maintenance.    Hypertension Yes    Intervention Provide education on lifestyle modifcations including regular physical activity/exercise, weight management, moderate sodium restriction and increased consumption of fresh fruit, vegetables, and low fat dairy, alcohol moderation, and smoking cessation.;Monitor prescription use compliance.    Expected Outcomes Short Term: Continued assessment and intervention until BP is < 140/80m HG in hypertensive participants. < 130/824mHG in hypertensive participants with diabetes, heart failure or chronic kidney disease.;Long Term: Maintenance of blood pressure at goal levels.    Lipids Yes    Intervention Provide education and support for participant on nutrition & aerobic/resistive exercise along with prescribed medications to achieve LDL '70mg'$ , HDL >'40mg'$ .    Expected Outcomes Short Term: Participant states understanding of desired cholesterol values and is compliant with medications prescribed. Participant is following exercise prescription and nutrition guidelines.;Long Term: Cholesterol controlled with medications as prescribed, with individualized exercise RX and with personalized nutrition plan. Value goals: LDL < '70mg'$ , HDL > 40 mg.           Education:Diabetes - Individual verbal and written instruction to review signs/symptoms of diabetes, desired ranges of glucose level fasting, after meals and with exercise. Acknowledge that pre and post exercise glucose checks will be done for 3 sessions at entry of program.   [facility]  Date 07/29/19  Educator [facility] Review Code 1- Verbalizes Understanding      Education: Know  Your Numbers and Risk Factors: -Group verbal and written instruction about important numbers in your health.  Discussion of what are risk factors and how they play a role in the disease process.  Review of Cholesterol, Blood Pressure, Diabetes, and BMI and the role they play in your overall health.   Core Components/Risk Factors/Patient Goals Review:    Core Components/Risk Factors/Patient Goals at Discharge (Final Review):    ITP Comments:  ITP Comments    Row Name 07/27/19 1448 07/29/19 1246         ITP Comments Completed virtual orientation today.  EP evaluation is scheduled for Thursday 6/17 at 11am.  [facility] Completed 6MWT and gym orientation.  Initial ITP created and sent for review to Dr. MaEmily FilbertMedical Director.  Comments: Initial ITP

## 2019-08-01 ENCOUNTER — Ambulatory Visit: Payer: Medicare Other

## 2019-08-02 ENCOUNTER — Encounter: Payer: Self-pay | Admitting: Physician Assistant

## 2019-08-02 ENCOUNTER — Other Ambulatory Visit: Payer: Self-pay

## 2019-08-02 ENCOUNTER — Ambulatory Visit (INDEPENDENT_AMBULATORY_CARE_PROVIDER_SITE_OTHER): Payer: Medicare Other | Admitting: Physician Assistant

## 2019-08-02 VITALS — BP 142/76 | HR 72 | Ht 67.5 in | Wt 171.0 lb

## 2019-08-02 DIAGNOSIS — N401 Enlarged prostate with lower urinary tract symptoms: Secondary | ICD-10-CM | POA: Diagnosis not present

## 2019-08-02 DIAGNOSIS — R3915 Urgency of urination: Secondary | ICD-10-CM | POA: Diagnosis not present

## 2019-08-02 DIAGNOSIS — R159 Full incontinence of feces: Secondary | ICD-10-CM

## 2019-08-02 LAB — BLADDER SCAN AMB NON-IMAGING: Scan Result: 157 mL

## 2019-08-02 MED ORDER — INSULIN GLARGINE 100 UNIT/ML ~~LOC~~ SOLN: 20.0000 [IU] | Freq: Every day | SUBCUTANEOUS | 3 refills | Status: DC

## 2019-08-02 MED ORDER — FINASTERIDE 5 MG PO TABS: 5.0000 mg | ORAL_TABLET | Freq: Every day | ORAL | 6 refills | Status: DC

## 2019-08-02 NOTE — Telephone Encounter (Signed)
Patient's wife called to check on refill She stated that the pharmacy has not received the refills

## 2019-08-02 NOTE — Patient Instructions (Signed)
Stop Gemtesa (the samples I gave you last time). Restart finasteride and continue taking doxazosin. Read over the pamphlet for PTNS. If you would like to start these treatments, call the office and we will set you up for them.
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
## 2019-08-04 DIAGNOSIS — R197 Diarrhea, unspecified: Secondary | ICD-10-CM | POA: Diagnosis not present

## 2019-08-06 DIAGNOSIS — R159 Full incontinence of feces: Secondary | ICD-10-CM | POA: Diagnosis not present

## 2019-08-06 DIAGNOSIS — K5909 Other constipation: Secondary | ICD-10-CM | POA: Diagnosis not present

## 2019-08-09 ENCOUNTER — Telehealth: Payer: Self-pay | Admitting: Family Medicine

## 2019-08-09 ENCOUNTER — Other Ambulatory Visit: Payer: Self-pay | Admitting: Family Medicine

## 2019-08-09 MED ORDER — INSULIN GLARGINE 100 UNIT/ML ~~LOC~~ SOLN: 20.0000 [IU] | Freq: Every day | SUBCUTANEOUS | 3 refills | Status: DC

## 2019-08-09 NOTE — Telephone Encounter (Signed)
Pt needs [facility]. [facility]

## 2019-08-09 NOTE — Telephone Encounter (Signed)
Spoke to pt and let him know that the [facility].  [facility] [place] and [facility] Rx

## 2019-08-10 ENCOUNTER — Other Ambulatory Visit: Payer: Self-pay

## 2019-08-10 ENCOUNTER — Emergency Department
Admission: EM | Admit: 2019-08-10 | Discharge: 2019-08-10 | Disposition: A | Discharge status: Home / Self Care | Payer: Medicare Other | Attending: Emergency Medicine | Admitting: Emergency Medicine

## 2019-08-10 ENCOUNTER — Encounter: Payer: Self-pay | Admitting: Intensive Care

## 2019-08-10 DIAGNOSIS — Z9861 Coronary angioplasty status: Secondary | ICD-10-CM | POA: Diagnosis not present

## 2019-08-10 DIAGNOSIS — R55 Syncope and collapse: Secondary | ICD-10-CM | POA: Insufficient documentation

## 2019-08-10 DIAGNOSIS — I1310 Hypertensive heart and chronic kidney disease without heart failure, with stage 1 through stage 4 chronic kidney disease, or unspecified chronic kidney disease: Secondary | ICD-10-CM | POA: Diagnosis not present

## 2019-08-10 DIAGNOSIS — I10 Essential (primary) hypertension: Secondary | ICD-10-CM | POA: Diagnosis not present

## 2019-08-10 DIAGNOSIS — I2510 Atherosclerotic heart disease of native coronary artery without angina pectoris: Secondary | ICD-10-CM | POA: Insufficient documentation

## 2019-08-10 DIAGNOSIS — R4020 Unspecified coma: Secondary | ICD-10-CM | POA: Diagnosis not present

## 2019-08-10 DIAGNOSIS — N183 Chronic kidney disease, stage 3 unspecified: Secondary | ICD-10-CM | POA: Diagnosis not present

## 2019-08-10 DIAGNOSIS — R531 Weakness: Secondary | ICD-10-CM

## 2019-08-10 LAB — CBC
HCT: 30 % — ABNORMAL LOW (ref 39.0–52.0)
Hemoglobin: 9.8 g/dL — ABNORMAL LOW (ref 13.0–17.0)
MCH: 28.6 pg (ref 26.0–34.0)
MCHC: 32.7 g/dL (ref 30.0–36.0)
MCV: 87.5 fL (ref 80.0–100.0)
Platelets: 240 10*3/uL (ref 150–400)
RBC: 3.43 MIL/uL — ABNORMAL LOW (ref 4.22–5.81)
RDW: 15.1 % (ref 11.5–15.5)
WBC: 10.8 10*3/uL — ABNORMAL HIGH (ref 4.0–10.5)
nRBC: 0 % (ref 0.0–0.2)

## 2019-08-10 LAB — BASIC METABOLIC PANEL
Anion gap: 10 (ref 5–15)
BUN: 32 mg/dL — ABNORMAL HIGH (ref 8–23)
CO2: 21 mmol/L — ABNORMAL LOW (ref 22–32)
Calcium: 8 mg/dL — ABNORMAL LOW (ref 8.9–10.3)
Chloride: 107 mmol/L (ref 98–111)
Creatinine, Ser: 1.48 mg/dL — ABNORMAL HIGH (ref 0.61–1.24)
GFR calc Af Amer: 49 mL/min — ABNORMAL LOW (ref >60)
GFR calc non Af Amer: 42 mL/min — ABNORMAL LOW (ref >60)
Glucose, Bld: 103 mg/dL — ABNORMAL HIGH (ref 70–99)
Potassium: 4.9 mmol/L (ref 3.5–5.1)
Sodium: 138 mmol/L (ref 135–145)

## 2019-08-10 LAB — TROPONIN I (HIGH SENSITIVITY): Troponin I (High Sensitivity): 21 ng/L — ABNORMAL HIGH (ref <18)

## 2019-08-10 LAB — GLUCOSE, CAPILLARY: Glucose-Capillary: 78 mg/dL (ref 70–99)

## 2019-08-10 MED ORDER — SODIUM CHLORIDE 0.9 % IV SOLN: Freq: Once | INTRAVENOUS | Status: AC

## 2019-08-10 NOTE — ED Notes (Signed)
Ally RN at bedside attempting for IV.

## 2019-08-10 NOTE — ED Notes (Signed)
Pt's wife Vaughan Basta called and notified pt being d/c. States she is on her way to pick pt up. EDP Williams to bedside to talk with pt.

## 2019-08-10 NOTE — ED Triage Notes (Signed)
Patient arrived by EMS for syncopal episode. Per patient he had gone to the grocery store and needed to rest after unloading. He reports falling asleep in kitchen. Per wife she couldn't get patient to wake up for an hour. EMS noted it was difficult to get him to respond. Patient reports not getting much sleep lately. Patient A&O x4 in triage.

## 2019-08-10 NOTE — ED Notes (Signed)
Pt's wife Vaughan Basta called by number in demographics and updated per pt request.

## 2019-08-10 NOTE — ED Notes (Signed)
Called lab. Levada Dy states she will have phlebotomy stop by to collect trop STAT.

## 2019-08-10 NOTE — ED Notes (Signed)
Pt reports "drifting off" this morning; no eating today; states has diabetes; had BG checked several times while at the house per pt and BG was WDL per pt; pt states has never experienced this type of fatigue/lethargy before. A&Ox4.

## 2019-08-10 NOTE — ED Notes (Signed)
Attempted for 20g IV x2. Will have 2nd RN try. Pt jerks arm back every time. Educated.

## 2019-08-10 NOTE — ED Notes (Signed)
EDP Williams at bedside.
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
## 2019-08-10 NOTE — ED Triage Notes (Signed)
Pt in via [facility] from home with c/o   EMS reports fell asleep in the kitchen. Pt wife reports that she could not wake him up. First responders noted it was difficult to get him to respond. EMS reports pt has been A&O with them and communicating well. Pt reports has just felt weak for a few days and has been tired and sleeping a lot. 190/70 BP, HR 70, RR 16, 99%RA, CBG 159

## 2019-08-17 ENCOUNTER — Ambulatory Visit: Payer: Medicare Other

## 2019-08-17 ENCOUNTER — Encounter: Payer: Self-pay | Admitting: *Deleted

## 2019-08-17 ENCOUNTER — Telehealth: Payer: Self-pay | Admitting: *Deleted

## 2019-08-17 DIAGNOSIS — Z955 Presence of coronary angioplasty implant and graft: Secondary | ICD-10-CM

## 2019-08-17 NOTE — Telephone Encounter (Signed)
pt called out requesting hold for 2 more weeks, no reason given

## 2019-08-18 ENCOUNTER — Other Ambulatory Visit: Payer: Self-pay

## 2019-08-18 ENCOUNTER — Encounter: Payer: Self-pay | Admitting: Podiatry

## 2019-08-18 ENCOUNTER — Ambulatory Visit (INDEPENDENT_AMBULATORY_CARE_PROVIDER_SITE_OTHER): Payer: Medicare Other | Admitting: Podiatry

## 2019-08-18 ENCOUNTER — Ambulatory Visit (INDEPENDENT_AMBULATORY_CARE_PROVIDER_SITE_OTHER): Payer: Medicare Other

## 2019-08-18 ENCOUNTER — Other Ambulatory Visit: Payer: Self-pay | Admitting: Podiatry

## 2019-08-18 DIAGNOSIS — S90122A Contusion of left lesser toe(s) without damage to nail, initial encounter: Secondary | ICD-10-CM

## 2019-08-18 DIAGNOSIS — E1142 Type 2 diabetes mellitus with diabetic polyneuropathy: Secondary | ICD-10-CM

## 2019-08-18 DIAGNOSIS — I200 Unstable angina: Secondary | ICD-10-CM

## 2019-08-18 DIAGNOSIS — M79676 Pain in unspecified toe(s): Secondary | ICD-10-CM

## 2019-08-18 DIAGNOSIS — S90415A Abrasion, left lesser toe(s), initial encounter: Secondary | ICD-10-CM

## 2019-08-18 DIAGNOSIS — M778 Other enthesopathies, not elsewhere classified: Secondary | ICD-10-CM | POA: Diagnosis not present

## 2019-08-18 DIAGNOSIS — I739 Peripheral vascular disease, unspecified: Secondary | ICD-10-CM

## 2019-08-18 DIAGNOSIS — B351 Tinea unguium: Secondary | ICD-10-CM | POA: Diagnosis not present

## 2019-08-18 MED ORDER — MUPIROCIN 2 % EX OINT: TOPICAL_OINTMENT | CUTANEOUS | 1 refills | Status: DC

## 2019-08-18 NOTE — Progress Notes (Signed)
He presents today for diabetic foot evaluation and nail trim. States that have been both in the toes and a bump to the third one on the left foot. Is a little bit red and a little bit tender.  Objective: Vital signs are stable alert oriented x3 pulses are nonpalpable feet are warm to the touch capillary fill time is a bit sluggish but does have a history of peripheral vascular disease and is aware of this already. Severe hammertoe deformity third digit left foot with a small abrasion with mild erythema surrounding it. No purulence no malodor radiographs taken today do not demonstrate a fracture of the third toe it does demonstrate severe mallet deformity.  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  Assessment: Pain in limb secondary to excoriation third digit left foot and painful toenails bilaterally.  Plan: Started him on Bactroban and I debrided the nails bilaterally. Watch signs symptoms of infection if there are any he will notify us immediately.

## 2019-08-19 ENCOUNTER — Ambulatory Visit: Payer: Medicare Other

## 2019-08-20 ENCOUNTER — Other Ambulatory Visit
Admission: RE | Admit: 2019-08-20 | Discharge: 2019-08-20 | Disposition: A | Discharge status: Home / Self Care | Payer: Medicare Other | Source: Ambulatory Visit | Attending: Gastroenterology | Admitting: Gastroenterology

## 2019-08-20 DIAGNOSIS — R159 Full incontinence of feces: Secondary | ICD-10-CM | POA: Insufficient documentation

## 2019-08-20 DIAGNOSIS — A0472 Enterocolitis due to Clostridium difficile, not specified as recurrent: Secondary | ICD-10-CM | POA: Diagnosis not present

## 2019-08-20 LAB — C DIFFICILE QUICK SCREEN W PCR REFLEX
C Diff antigen: NEGATIVE
C Diff interpretation: NOT DETECTED
C Diff toxin: NEGATIVE

## 2019-08-23 ENCOUNTER — Encounter: Payer: Self-pay | Admitting: *Deleted

## 2019-08-23 DIAGNOSIS — Z955 Presence of coronary angioplasty implant and graft: Secondary | ICD-10-CM

## 2019-08-24 ENCOUNTER — Ambulatory Visit: Payer: Medicare Other

## 2019-08-25 ENCOUNTER — Encounter: Payer: Self-pay | Admitting: *Deleted

## 2019-08-25 DIAGNOSIS — Z955 Presence of coronary angioplasty implant and graft: Secondary | ICD-10-CM

## 2019-08-25 NOTE — Progress Notes (Signed)
Cardiac Individual Treatment Plan  Patient Details  Name: Orhan Mayorga MRN: [id number] Date of Birth: 1930/06/09 Referring Provider:     Cardiac Rehab from 07/29/2019 in [facility] Cardiac and Pulmonary Rehab  Referring Provider End, Harrell Gave MD      Initial Encounter Date:    Cardiac Rehab from 07/29/2019 in [facility] Cardiac and Pulmonary Rehab  Date 07/29/19      Visit Diagnosis: Status post coronary artery stent placement  Patient's Home Medications on Admission:  Current Outpatient Medications:  .  alendronate (FOSAMAX) 70 MG tablet, Take 70 mg by mouth once a week., Disp: , Rfl:  .  allopurinol (ZYLOPRIM) 100 MG tablet, TAKE 1 TABLET BY MOUTH EVERY DAY (Patient taking differently: Take 100 mg by mouth daily. ), Disp: 90 tablet, Rfl: 2 .  amLODipine (NORVASC) 10 MG tablet, TAKE 1 TABLET BY MOUTH EVERY DAY (Patient taking differently: Take 10 mg by mouth daily. ), Disp: 90 tablet, Rfl: 2 .  Ascorbic Acid (VITAMIN C) 1000 MG tablet, Take 1,000 mg by mouth daily., Disp: , Rfl:  .  aspirin EC 81 MG tablet, Take 81 mg by mouth daily., Disp: , Rfl:  .  atorvastatin (LIPITOR) 80 MG tablet, Take 1 tablet (80 mg total) by mouth daily., Disp: 90 tablet, Rfl: 0 .  beta carotene 25000 UNIT capsule, Take 25,000 Units by mouth daily., Disp: , Rfl:  .  carvedilol (COREG) 6.25 MG tablet, Take 1 tablet (6.25 mg total) by mouth 2 (two) times daily with a meal., Disp: 180 tablet, Rfl: 0 .  clopidogrel (PLAVIX) 75 MG tablet, Take 1 tablet (75 mg total) by mouth daily., Disp: 90 tablet, Rfl: 3 .  donepezil (ARICEPT) 10 MG tablet, TAKE 1 TABLET BY MOUTH EVERYDAY AT BEDTIME (Patient taking differently: Take 10 mg by mouth at bedtime. ), Disp: 90 tablet, Rfl: 2 .  doxazosin (CARDURA) 4 MG tablet, Take 1 tablet (4 mg total) by mouth at bedtime., Disp: 90 tablet, Rfl: 2 .  doxycycline (VIBRAMYCIN) 100 MG capsule, Take by mouth as directed., Disp: , Rfl:  .  ferrous sulfate 325 (65 FE) MG tablet, TAKE 1 TABLET  BY MOUTH EVERY DAY (Patient taking differently: Take 325 mg by mouth daily. ), Disp: 90 tablet, Rfl: 1 .  finasteride (PROSCAR) 5 MG tablet, Take 1 tablet (5 mg total) by mouth daily., Disp: 30 tablet, Rfl: 6 .  Flaxseed, Linseed, (FLAX SEED OIL) 1000 MG CAPS, Take 1,000 mg by mouth 2 (two) times a week. , Disp: , Rfl:  .  folic acid (FOLVITE) 094 MCG tablet, Take 400 mcg by mouth 2 (two) times a week. , Disp: , Rfl:  .  gabapentin (NEURONTIN) 300 MG capsule, Take 1 capsule (300 mg total) by mouth 2 (two) times daily., Disp: 60 capsule, Rfl: 5 .  hydrALAZINE (APRESOLINE) 50 MG tablet, Take 1 tablet (50 mg total) by mouth every 8 (eight) hours., Disp: 90 tablet, Rfl: 0 .  hydrochlorothiazide (HYDRODIURIL) 12.5 MG tablet, Take 12.5 mg by mouth daily., Disp: , Rfl:  .  insulin glargine (LANTUS) 100 UNIT/ML injection, Inject 0.2 mLs (20 Units total) into the skin at bedtime., Disp: 20 mL, Rfl: 3 .  Insulin Syringe-Needle U-100 (TRUEPLUS INSULIN SYRINGE) 31G X 5/16" 1 ML MISC, Use daily as directed. Dispense needles as prescribed in the past. DX: E11.22, Disp: 300 each, Rfl: 11 .  isosorbide mononitrate (IMDUR) 60 MG 24 hr tablet, Take 1 tablet (60 mg total) by mouth daily., Disp: 90 tablet, Rfl:  0 .  loperamide (IMODIUM) 2 MG capsule, Take 1 capsule (2 mg total) by mouth every 4 (four) hours as needed for diarrhea or loose stools., Disp: 30 capsule, Rfl: 0 .  losartan (COZAAR) 100 MG tablet, TAKE 1 TABLET BY MOUTH EVERY DAY, Disp: 90 tablet, Rfl: 1 .  memantine (NAMENDA) 10 MG tablet, TAKE 1 TABLET BY MOUTH TWICE A DAY (Patient taking differently: Take 10 mg by mouth 2 (two) times daily. ), Disp: 180 tablet, Rfl: 1 .  mupirocin ointment (BACTROBAN) 2 %, Apply to wound after soaking BID, Disp: 30 g, Rfl: 1 .  nitroGLYCERIN (NITROSTAT) 0.4 MG SL tablet, Place 1 tablet (0.4 mg total) under the tongue every 5 (five) minutes x 3 doses as needed for chest pain., Disp: 25 tablet, Rfl: 1 .  Omega-3 Fatty Acids  (FISH OIL) 500 MG CAPS, Take 500 mg by mouth 2 (two) times a week. , Disp: , Rfl:  .  selenium 50 MCG TABS tablet, Take 50 mcg by mouth 2 (two) times a week. , Disp: , Rfl:  .  sertraline (ZOLOFT) 25 MG tablet, TAKE 1 TABLET BY MOUTH EVERY DAY (Patient taking differently: Take 25 mg by mouth daily. ), Disp: 90 tablet, Rfl: 2 .  vancomycin (VANCOCIN) 125 MG capsule, Take 125 mg by mouth 4 (four) times daily., Disp: , Rfl:   Past Medical History: Past Medical History:  Diagnosis Date  . Allergy   . Arthritis   . Carpal tunnel syndrome   . CKD (chronic kidney disease), stage III   . Coronary artery disease    a. 03/2018 PCI [facility]): RCA 95p, 73m(2.[place]x38 [place]); b. 06/2019 PCI: LM 40ost, LAD 85p, 60p/m (atherectomy w/ 3.0x48 Synergy DES p/m LAD), 552mLCX 50d, OM2/3 50, RCA patent prox/mid stent, 50d, RPDA 50.  . Diabetes mellitus without complication ([facility]  . Diastolic dysfunction    a. 06/2019 Echo: EF 60-65%, no rwma, mod LVH, Gr1 DD. Nl RV fxn. Mildly dil LA. Mild-mod TR.  . Diverticulitis   . GERD (gastroesophageal reflux disease)   . Gout   . History of blood transfusion   . History of chicken pox   . Hyperlipidemia   . Hypertension   . Mild dementia ([facility]  . Myocardial infarction ([facility]/2020  . Normocytic anemia    a. 06/2019 s/p 1u PRBCs.  . Marland KitchenAD (peripheral artery disease) ([facility]   a. 11/2018 s/p R SFA, popliteal, and peroneal artery PTA.  . [facility]   prostate  . Prostate cancer ([facility]  . Syncope     Tobacco Use: Social History   Tobacco Use  Smoking Status Former Smoker  . Packs/day: 0.75  . Years: 12.00  . Pack years: 9.00  . Types: Cigars, Cigarettes  . Quit date: 1977. Years since quitting: 51.5  Smokeless Tobacco Former [employer] developer. Types: Chew  . Quit date: 2016    Labs: Recent Review Flowsheet Data    Labs for ITP Cardiac and Pulmonary Rehab Latest Ref Rng & Units 09/30/2018 10/05/2018 06/15/2019 06/27/2019 07/05/2019    Cholestrol 0 - 200 mg/dL - 116 - 135 -   LDLCALC 0 - 99 mg/dL - 47 - 62 -   HDL >40 mg/dL - 58 - 61 -   Trlycerides <150 mg/dL - 55 - 62 -   Hemoglobin A1c 4.6 - 6.5 % 6.2(A) - 6.7(H) - -   TCO2 22 - 32  mmol/L - - - - 22       Exercise Target Goals: Exercise Program Goal: Individual exercise prescription set using results from initial 6 min walk test and THRR while considering  patient's activity barriers and safety.   Exercise Prescription Goal: Initial exercise prescription builds to 30-45 minutes a day of aerobic activity, 2-3 days per week.  Home exercise guidelines will be given to patient during program as part of exercise prescription that the participant will acknowledge.   Education: Aerobic Exercise & Resistance Training: - Gives group verbal and written instruction on the various components of exercise. Focuses on aerobic and resistive training programs and the benefits of this training and how to safely progress through these programs..   Education: Exercise & Equipment Safety: - Individual verbal instruction and demonstration of equipment use and safety with use of the equipment.   Cardiac Rehab from 07/29/2019 in [facility] Cardiac and Pulmonary Rehab  Date 07/29/19  Educator [facility]  Instruction Review Code 1- Verbalizes Understanding      Education: Exercise Physiology & General Exercise Guidelines: - Group verbal and written instruction with models to review the exercise physiology of the cardiovascular system and associated critical values. Provides general exercise guidelines with specific guidelines to those with heart or lung disease.    Education: Flexibility, Balance, Mind/Body Relaxation: Provides group verbal/written instruction on the benefits of flexibility and balance training, including mind/body exercise modes such as yoga, pilates and tai chi.  Demonstration and skill practice provided.   Activity Barriers & Risk Stratification:  Activity Barriers & Cardiac  Risk Stratification - 07/29/19 1254      Activity Barriers & Cardiac Risk Stratification   Activity Barriers Arthritis;Deconditioning;Muscular Weakness;Shortness of Breath;Joint Problems;Back Problems;Neck/Spine Problems;Balance Concerns;Other (comment)    Comments multiple old injuries, pain management, knees/back/hips/shoulders, r foot drags    Cardiac Risk Stratification Moderate           6 Minute Walk:  6 Minute Walk    Row Name 07/29/19 1249         6 Minute Walk   Phase Initial     Distance 740 feet     Walk Time 5 minutes     # of Rest Breaks 1  53mn     MPH 1.62     METS 0.92     RPE 13     Perceived Dyspnea  2     VO2 Peak 3.21     Symptoms Yes (comment)     Comments SOB, walking into walls, unsteady step     Resting HR 51 bpm     Resting BP 120/64     Resting Oxygen Saturation  99 %     Exercise Oxygen Saturation  during 6 min walk 97 %     Max Ex. HR 73 bpm     Max Ex. BP 152/80     2 Minute Post BP 126/70            Oxygen Initial Assessment:   Oxygen Re-Evaluation:   Oxygen Discharge (Final Oxygen Re-Evaluation):   Initial Exercise Prescription:  Initial Exercise Prescription - 07/29/19 1200      Date of Initial Exercise RX and Referring Provider   Date 07/29/19    Referring Provider End, CHarrell GaveMD      Treadmill   MPH 1.5    Grade 0    Minutes 15    METs 2.15      Recumbant Elliptical   Level 1  RPM 50    Minutes 15    METs 2      REL-XR   Level 1    Speed 50    Minutes 15    METs 2      Prescription Details   Frequency (times per week) 2    Duration Progress to 30 minutes of continuous aerobic without signs/symptoms of physical distress      Intensity   THRR 40-80% of Max Heartrate 84-117    Ratings of Perceived Exertion 11-13    Perceived Dyspnea 0-4      Progression   Progression Continue to progress workloads to maintain intensity without signs/symptoms of physical distress.      Resistance Training    Training Prescription Yes    Weight 3 lbs    Reps 10-15           Perform Capillary Blood Glucose checks as needed.  Exercise Prescription Changes:  Exercise Prescription Changes    Row Name 07/29/19 1200             Response to Exercise   Blood Pressure (Admit) 120/64       Blood Pressure (Exercise) 152/80       Blood Pressure (Exit) 126/70       Heart Rate (Admit) 51 bpm       Heart Rate (Exercise) 73 bpm       Heart Rate (Exit) 52 bpm       Oxygen Saturation (Admit) 99 %       Oxygen Saturation (Exercise) 97 %       Rating of Perceived Exertion (Exercise) 13       Perceived Dyspnea (Exercise) 2       Symptoms SOB, walking into walls, wobbly gait       Comments walk test results              Exercise Comments:   Exercise Goals and Review:  Exercise Goals    Row Name 07/29/19 1309             Exercise Goals   Increase Physical Activity Yes       Intervention Provide advice, education, support and counseling about physical activity/exercise needs.;Develop an individualized exercise prescription for aerobic and resistive training based on initial evaluation findings, risk stratification, comorbidities and participant's personal goals.       Expected Outcomes Short Term: Attend rehab on a regular basis to increase amount of physical activity.;Long Term: Add in home exercise to make exercise part of routine and to increase amount of physical activity.;Long Term: Exercising regularly at least 3-5 days a week.       Increase Strength and Stamina Yes       Intervention Provide advice, education, support and counseling about physical activity/exercise needs.;Develop an individualized exercise prescription for aerobic and resistive training based on initial evaluation findings, risk stratification, comorbidities and participant's personal goals.       Expected Outcomes Short Term: Increase workloads from initial exercise prescription for resistance, speed, and METs.;Short  Term: Perform resistance training exercises routinely during rehab and add in resistance training at home;Long Term: Improve cardiorespiratory fitness, muscular endurance and strength as measured by increased METs and functional capacity (6MWT)       Able to understand and use rate of perceived exertion (RPE) scale Yes       Intervention Provide education and explanation on how to use RPE scale       Expected Outcomes Short Term: Able  to use RPE daily in rehab to express subjective intensity level;Long Term:  Able to use RPE to guide intensity level when exercising independently       Able to understand and use Dyspnea scale Yes       Intervention Provide education and explanation on how to use Dyspnea scale       Expected Outcomes Short Term: Able to use Dyspnea scale daily in rehab to express subjective sense of shortness of breath during exertion;Long Term: Able to use Dyspnea scale to guide intensity level when exercising independently       Knowledge and understanding of Target Heart Rate Range (THRR) Yes       Intervention Provide education and explanation of THRR including how the numbers were predicted and where they are located for reference       Expected Outcomes Short Term: Able to state/look up THRR;Short Term: Able to use daily as guideline for intensity in rehab;Long Term: Able to use THRR to govern intensity when exercising independently       Able to check pulse independently Yes       Intervention Provide education and demonstration on how to check pulse in carotid and radial arteries.;Review the importance of being able to check your own pulse for safety during independent exercise       Expected Outcomes Short Term: Able to explain why pulse checking is important during independent exercise;Long Term: Able to check pulse independently and accurately       Understanding of Exercise Prescription Yes       Intervention Provide education, explanation, and written materials on patient's  individual exercise prescription       Expected Outcomes Short Term: Able to explain program exercise prescription;Long Term: Able to explain home exercise prescription to exercise independently              Exercise Goals Re-Evaluation :  Exercise Goals Re-Evaluation    Row Name 08/23/19 1059             Exercise Goal Re-Evaluation   Comments Has not started full sessions yet              Discharge Exercise Prescription (Final Exercise Prescription Changes):  Exercise Prescription Changes - 07/29/19 1200      Response to Exercise   Blood Pressure (Admit) 120/64    Blood Pressure (Exercise) 152/80    Blood Pressure (Exit) 126/70    Heart Rate (Admit) 51 bpm    Heart Rate (Exercise) 73 bpm    Heart Rate (Exit) 52 bpm    Oxygen Saturation (Admit) 99 %    Oxygen Saturation (Exercise) 97 %    Rating of Perceived Exertion (Exercise) 13    Perceived Dyspnea (Exercise) 2    Symptoms SOB, walking into walls, wobbly gait    Comments walk test results           Nutrition:  Target Goals: Understanding of nutrition guidelines, daily intake of sodium <1525m, cholesterol <2064m calories 30% from fat and 7% or less from saturated fats, daily to have 5 or more servings of fruits and vegetables.  Education: Controlling Sodium/Reading Food Labels -Group verbal and written material supporting the discussion of sodium use in heart healthy nutrition. Review and explanation with models, verbal and written materials for utilization of the food label.   Education: General Nutrition Guidelines/Fats and Fiber: -Group instruction provided by verbal, written material, models and posters to present the general guidelines for heart healthy nutrition. Gives an  explanation and review of dietary fats and fiber.   Biometrics:  Pre Biometrics - 07/29/19 1309      Pre Biometrics   Height 5' 7.5" (1.715 m)    Weight 171 lb 4.8 oz (77.7 kg)    BMI (Calculated) 26.42    Single Leg Stand 1.5  seconds            Nutrition Therapy Plan and Nutrition Goals:   Nutrition Assessments:  Nutrition Assessments - 07/29/19 1310      MEDFICTS Scores   Pre Score 57           MEDIFICTS Score Key:          ?70 Need to make dietary changes          40-70 Heart Healthy Diet         ? 40 Therapeutic Level Cholesterol Diet  Nutrition Goals Re-Evaluation:   Nutrition Goals Discharge (Final Nutrition Goals Re-Evaluation):   Psychosocial: Target Goals: Acknowledge presence or absence of significant depression and/or stress, maximize coping skills, provide positive support system. Participant is able to verbalize types and ability to use techniques and skills needed for reducing stress and depression.   Education: Depression - Provides group verbal and written instruction on the correlation between heart/lung disease and depressed mood, treatment options, and the stigmas associated with seeking treatment.   Education: Sleep Hygiene -Provides group verbal and written instruction about how sleep can affect your health.  Define sleep hygiene, discuss sleep cycles and impact of sleep habits. Review good sleep hygiene tips.     Education: Stress and Anxiety: - Provides group verbal and written instruction about the health risks of elevated stress and causes of high stress.  Discuss the correlation between heart/lung disease and anxiety and treatment options. Review healthy ways to manage with stress and anxiety.    Initial Review & Psychosocial Screening:  Initial Psych Review & Screening - 07/27/19 1415      Initial Review   Current issues with Current Stress Concerns;Current Anxiety/Panic;Current Sleep Concerns    Source of Stress Concerns Chronic Illness;Occupation;Unable to participate in former interests or hobbies;Unable to perform yard/household activities    Comments "Angry" about being retired and not getting out, a little anxiety and interest in wanting to get out and  physcially in bad shape, most night gets about 6 hours (wakes at 130am and up for about an hour)      [place]? Yes   wife, daughter live 10-70mn away, daughter in [place](both with families of own)     Barriers   Psychosocial barriers to participate in program Psychosocial barriers identified (see note);The patient should benefit from training in stress management and relaxation.      Screening Interventions   Interventions Encouraged to exercise;To provide support and resources with identified psychosocial needs;Provide feedback about the scores to participant    Expected Outcomes Short Term goal: Utilizing psychosocial counselor, staff and physician to assist with identification of specific Stressors or current issues interfering with healing process. Setting desired goal for each stressor or current issue identified.;Long Term Goal: Stressors or current issues are controlled or eliminated.;Short Term goal: Identification and review with participant of any Quality of Life or Depression concerns found by scoring the questionnaire.;Long Term goal: The participant improves quality of Life and PHQ9 Scores as seen by post scores and/or verbalization of changes           Quality of Life Scores:  Quality of Life - 07/29/19 1310      Quality of Life   Select Quality of Life      Quality of Life Scores   Health/Function Pre 19.03 %    Socioeconomic Pre 25.56 %    Psych/Spiritual Pre 28.57 %    Family Pre 24.7 %    GLOBAL Pre 23.7 %          Scores of 19 and below usually indicate a poorer quality of life in these areas.  A difference of  2-3 points is a clinically meaningful difference.  A difference of 2-3 points in the total score of the Quality of Life Index has been associated with significant improvement in overall quality of life, self-image, physical symptoms, and general health in studies assessing change in quality of life.  PHQ-9: Recent  Review Flowsheet Data    Depression screen [facility] 2/9 07/29/2019 05/31/2019 12/28/2018   Decreased Interest 0 0 0   Down, Depressed, Hopeless 1 0 0   PHQ - 2 Score 1 0 0   Altered sleeping 3 0 -   Tired, decreased energy 3 0 -   Change in appetite 1 0 -   Feeling bad or failure about yourself  1 0 -   Trouble concentrating 0 0 -   Moving slowly or fidgety/restless 1 0 -   Suicidal thoughts 0 0 -   PHQ-9 Score 10 0 -   Difficult doing work/chores Somewhat difficult Not difficult at all -     Interpretation of Total Score  Total Score Depression Severity:  1-4 = Minimal depression, 5-9 = Mild depression, 10-14 = Moderate depression, 15-19 = Moderately severe depression, 20-27 = Severe depression   Psychosocial Evaluation and Intervention:  Psychosocial Evaluation - 07/27/19 1431      Psychosocial Evaluation & Interventions   Interventions Stress management education;Relaxation education;Encouraged to exercise with the program and follow exercise prescription    Comments [name] is coming into cardiac rehab after having another stent placed.  He had two put in last May as well.  His wife is encouraging his attendance, but he is apprehensive as he has not had a lot of success with previous PT rehabs.  He has a history of mulitple injuries that have caused chronic pain issues.  He does have some anxiety issues and does not sleep the best, but he is a straight forward person and tells you how he feels.  His main motivation for coming to rehab is his wife wants him to do it.  She is his main support but they also have daughters that live nearby too. He would like to prevent any future heart events and move around better.  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    Expected Outcomes Short: Attend rehab to build strength back up Long: Continue to improve overall well being.    Continue Psychosocial  Services  Follow up required by staff           Psychosocial Re-Evaluation:   Psychosocial Discharge (Final Psychosocial Re-Evaluation):   Vocational Rehabilitation: Provide vocational rehab assistance to qualifying candidates.   Vocational Rehab Evaluation & Intervention:  Vocational Rehab - 07/27/19 1415      Initial Vocational Rehab Evaluation & Intervention   Assessment shows need for Vocational Rehabilitation No   retired          Education:  Education Goals: Education classes will be provided on a variety of topics geared toward better understanding of heart health and risk factor modification. Participant will state understanding/return demonstration of topics presented as noted by education test scores.  Learning Barriers/Preferences:  Learning Barriers/Preferences - 07/27/19 1414      Learning Barriers/Preferences   Learning Barriers Sight   glasses for reading   Learning Preferences Video;Audio;Written Material;Verbal Instruction           General Cardiac Education Topics:  AED/CPR: - Group verbal and written instruction with the use of models to demonstrate the basic use of the AED with the basic ABC's of resuscitation.   Anatomy & Physiology of the Heart: - Group verbal and written instruction and models provide basic cardiac anatomy and physiology, with the coronary electrical and arterial systems. Review of Valvular disease and Heart Failure   Cardiac Procedures: - Group verbal and written instruction to review commonly prescribed medications for heart disease. Reviews the medication, class of the drug, and side effects. Includes the steps to properly store meds and maintain the prescription regimen. (beta blockers and nitrates)   Cardiac Medications I: - Group verbal and written instruction to review commonly prescribed medications for heart disease. Reviews the medication, class of the drug, and side effects. Includes the steps to properly store  meds and maintain the prescription regimen.   Cardiac Medications II: -Group verbal and written instruction to review commonly prescribed medications for heart disease. Reviews the medication, class of the drug, and side effects. (all other drug classes)    Go Sex-Intimacy & Heart Disease, Get SMART - Goal Setting: - Group verbal and written instruction through game format to discuss heart disease and the return to sexual intimacy. Provides group verbal and written material to discuss and apply goal setting through the application of the S.M.A.R.T. Method.   Other Matters of the Heart: - Provides group verbal, written materials and models to describe Stable Angina and Peripheral Artery. Includes description of the disease process and treatment options available to the cardiac patient.   Infection Prevention: - Provides verbal and written material to individual with discussion of infection control including proper hand washing and proper equipment cleaning during exercise session.   Cardiac Rehab from 07/29/2019 in [facility]  Date 07/29/19  Educator [employer]  Instruction Review Code 1- Verbalizes Understanding      Falls Prevention: - Provides verbal and written material to individual with discussion of falls prevention and safety.   Cardiac Rehab from 07/29/2019 in [facility]iac [facility] Pulmonary Rehab  Date 07/29/19  Educator [facility]  Instruction Review Code 1- Verbalizes Understanding      Other: -Provides group and verbal instruction on various topics (see comments)   Knowledge Questionnaire Score:  Knowledge Questionnaire Score - 07/29/19 1310      Knowledge Questionnaire Score   Pre Score 22/26 Education Focus: Exercise and Nutrition           Core Components/Risk Factors/Patient Goals at Admission:  Personal Goals and Risk Factors at Admission - 07/29/19 1311      Core Components/Risk Factors/Patient Goals on Admission    Weight Management Yes;Weight  Loss;Obesity    Intervention Weight Management: Develop a combined nutrition and exercise program designed to reach desired caloric intake, while maintaining appropriate intake of nutrient and fiber, sodium and fats, and appropriate energy expenditure required for the weight goal.;Weight Management: Provide education and appropriate resources to help participant work on and attain dietary goals.;Weight Management/Obesity: Establish  reasonable short term and long term weight goals.;Obesity: Provide education and appropriate resources to help participant work on and attain dietary goals.    Admit Weight 171 lb 4.8 oz (77.7 kg)    Goal Weight: Short Term 165 lb (74.8 kg)    Goal Weight: Long Term 160 lb (72.6 kg)    Expected Outcomes Short Term: Continue to assess and modify interventions until short term weight is achieved;Long Term: Adherence to nutrition and physical activity/exercise program aimed toward attainment of established weight goal;Weight Loss: Understanding of general recommendations for a balanced deficit meal plan, which promotes 1-2 lb weight loss per week and includes a negative energy balance of [phone] kcal/d;Understanding recommendations for meals to include 15-35% energy as protein, 25-35% energy from fat, 35-60% energy from carbohydrates, less than 218m of dietary cholesterol, 20-35 gm of total fiber daily;Understanding of distribution of calorie intake throughout the day with the consumption of 4-5 meals/snacks    Diabetes Yes    Intervention Provide education about signs/symptoms and action to take for hypo/hyperglycemia.;Provide education about proper nutrition, including hydration, and aerobic/resistive exercise prescription along with prescribed medications to achieve blood glucose in normal ranges: Fasting glucose 65-99 mg/dL    Expected Outcomes Short Term: Participant verbalizes understanding of the signs/symptoms and immediate care of hyper/hypoglycemia, proper foot care and  importance of medication, aerobic/resistive exercise and nutrition plan for blood glucose control.;Long Term: Attainment of HbA1C < 7%.    Heart Failure Yes    Intervention Provide a combined exercise and nutrition program that is supplemented with education, support and counseling about heart failure. Directed toward relieving symptoms such as shortness of breath, decreased exercise tolerance, and extremity edema.    Expected Outcomes Improve functional capacity of life;Short term: Attendance in program 2-3 days a week with increased exercise capacity. Reported lower sodium intake. Reported increased fruit and vegetable intake. Reports medication compliance.;Short term: Daily weights obtained and reported for increase. Utilizing diuretic protocols set by physician.;Long term: Adoption of self-care skills and reduction of barriers for early signs and symptoms recognition and intervention leading to self-care maintenance.    Hypertension Yes    Intervention Provide education on lifestyle modifcations including regular physical activity/exercise, weight management, moderate sodium restriction and increased consumption of fresh fruit, vegetables, and low fat dairy, alcohol moderation, and smoking cessation.;Monitor prescription use compliance.    Expected Outcomes Short Term: Continued assessment and intervention until BP is < 140/987mHG in hypertensive participants. < 130/8064mG in hypertensive participants with diabetes, heart failure or chronic kidney disease.;Long Term: Maintenance of blood pressure at goal levels.    Lipids Yes    Intervention Provide education and support for participant on nutrition & aerobic/resistive exercise along with prescribed medications to achieve LDL <29m67mDL >40mg80m Expected Outcomes Short Term: Participant states understanding of desired cholesterol values and is compliant with medications prescribed. Participant is following exercise prescription and nutrition  guidelines.;Long Term: Cholesterol controlled with medications as prescribed, with individualized exercise RX and with personalized nutrition plan. Value goals: LDL < 29mg,5m > 40 mg.           Education:Diabetes - Individual verbal and written instruction to review signs/symptoms of diabetes, desired ranges of glucose level fasting, after meals and with exercise. Acknowledge that pre and post exercise glucose checks will be done for 3 sessions at entry of program.   [facility]  Date 07/29/19  [facility]  Verbalizes Understanding      Education: Know Your Numbers and Risk Factors: -Group verbal and written instruction about important numbers in your health.  Discussion of what are risk factors and how they play a role in the disease process.  Review of Cholesterol, Blood Pressure, Diabetes, and BMI and the role they play in your overall health.   Core Components/Risk Factors/Patient Goals Review:    Core Components/Risk Factors/Patient Goals at Discharge (Final Review):    ITP Comments:  ITP Comments    Row Name 07/27/19 1448 07/29/19 1246 08/09/19 1412 08/17/19 1359 08/23/19 1058   ITP Comments Completed virtual orientation today.  EP evaluation is scheduled for Thursday 6/17 at 11am.  Documentation for diagnosis can be found in [facility] encounter 07/05/19. Completed 6MWT and gym orientation.  Initial ITP created and sent for review to Dr. [name] Filbert, Medical Director. [name] has not started yet.  Expected start is 08/17/19. pt called out requesting hold for 2 more weeks, no reason given Still out on hold   [place] Name 08/25/19 0603           ITP Comments 30 Day review completed. Medical Director ITP review done, changes made as directed, and signed approval by Medical Director.              Comments:

## 2019-08-26 ENCOUNTER — Ambulatory Visit: Payer: Medicare Other

## 2019-08-31 ENCOUNTER — Ambulatory Visit: Payer: Medicare Other

## 2019-09-02 ENCOUNTER — Encounter: Payer: Self-pay | Admitting: *Deleted

## 2019-09-02 ENCOUNTER — Telehealth: Payer: Self-pay | Admitting: *Deleted

## 2019-09-02 ENCOUNTER — Ambulatory Visit: Payer: Medicare Other

## 2019-09-02 DIAGNOSIS — Z955 Presence of coronary angioplasty implant and graft: Secondary | ICD-10-CM

## 2019-09-02 NOTE — Telephone Encounter (Signed)
Daughter called to let us know that her dad would like to discharge from the program.

## 2019-09-02 NOTE — Progress Notes (Signed)
Cardiac Individual Treatment Plan  Patient Details  Name: Peter [name] MRN: [id number] Date of Birth: Dec 03, 1930 Referring Provider:     Cardiac Rehab from 07/29/2019 in [facility] Cardiac and Pulmonary Rehab  Referring Provider End, Harrell Gave MD      Initial Encounter Date:    Cardiac Rehab from 07/29/2019 in [facility] Cardiac and Pulmonary Rehab  Date 07/29/19      Visit Diagnosis: Status post coronary artery stent placement  Patient's Home Medications on Admission:  Current Outpatient Medications:  .  alendronate (FOSAMAX) 70 MG tablet, Take 70 mg by mouth once a week., Disp: , Rfl:  .  allopurinol (ZYLOPRIM) 100 MG tablet, TAKE 1 TABLET BY MOUTH EVERY DAY (Patient taking differently: Take 100 mg by mouth daily. ), Disp: 90 tablet, Rfl: 2 .  amLODipine (NORVASC) 10 MG tablet, TAKE 1 TABLET BY MOUTH EVERY DAY (Patient taking differently: Take 10 mg by mouth daily. ), Disp: 90 tablet, Rfl: 2 .  Ascorbic Acid (VITAMIN C) 1000 MG tablet, Take 1,000 mg by mouth daily., Disp: , Rfl:  .  aspirin EC 81 MG tablet, Take 81 mg by mouth daily., Disp: , Rfl:  .  atorvastatin (LIPITOR) 80 MG tablet, Take 1 tablet (80 mg total) by mouth daily., Disp: 90 tablet, Rfl: 0 .  beta carotene 25000 UNIT capsule, Take 25,000 Units by mouth daily., Disp: , Rfl:  .  carvedilol (COREG) 6.25 MG tablet, Take 1 tablet (6.25 mg total) by mouth 2 (two) times daily with a meal., Disp: 180 tablet, Rfl: 0 .  clopidogrel (PLAVIX) 75 MG tablet, Take 1 tablet (75 mg total) by mouth daily., Disp: 90 tablet, Rfl: 3 .  donepezil (ARICEPT) 10 MG tablet, TAKE 1 TABLET BY MOUTH EVERYDAY AT BEDTIME (Patient taking differently: Take 10 mg by mouth at bedtime. ), Disp: 90 tablet, Rfl: 2 .  doxazosin (CARDURA) 4 MG tablet, Take 1 tablet (4 mg total) by mouth at bedtime., Disp: 90 tablet, Rfl: 2 .  doxycycline (VIBRAMYCIN) 100 MG capsule, Take by mouth as directed., Disp: , Rfl:  .  ferrous sulfate 325 (65 FE) MG tablet, TAKE 1 TABLET  BY MOUTH EVERY DAY (Patient taking differently: Take 325 mg by mouth daily. ), Disp: 90 tablet, Rfl: 1 .  finasteride (PROSCAR) 5 MG tablet, Take 1 tablet (5 mg total) by mouth daily., Disp: 30 tablet, Rfl: 6 .  Flaxseed, Linseed, (FLAX SEED OIL) 1000 MG CAPS, Take 1,000 mg by mouth 2 (two) times a week. , Disp: , Rfl:  .  folic acid (FOLVITE) 384 MCG tablet, Take 400 mcg by mouth 2 (two) times a week. , Disp: , Rfl:  .  gabapentin (NEURONTIN) 300 MG capsule, Take 1 capsule (300 mg total) by mouth 2 (two) times daily., Disp: 60 capsule, Rfl: 5 .  hydrALAZINE (APRESOLINE) 50 MG tablet, Take 1 tablet (50 mg total) by mouth every 8 (eight) hours., Disp: 90 tablet, Rfl: 0 .  hydrochlorothiazide (HYDRODIURIL) 12.5 MG tablet, Take 12.5 mg by mouth daily., Disp: , Rfl:  .  insulin glargine (LANTUS) 100 UNIT/ML injection, Inject 0.2 mLs (20 Units total) into the skin at bedtime., Disp: 20 mL, Rfl: 3 .  Insulin Syringe-Needle U-100 (TRUEPLUS INSULIN SYRINGE) 31G X 5/16" 1 ML MISC, Use daily as directed. Dispense needles as prescribed in the past. DX: E11.22, Disp: 300 each, Rfl: 11 .  isosorbide mononitrate (IMDUR) 60 MG 24 hr tablet, Take 1 tablet (60 mg total) by mouth daily., Disp: 90 tablet, Rfl:  0 .  loperamide (IMODIUM) 2 MG capsule, Take 1 capsule (2 mg total) by mouth every 4 (four) hours as needed for diarrhea or loose stools., Disp: 30 capsule, Rfl: 0 .  losartan (COZAAR) 100 MG tablet, TAKE 1 TABLET BY MOUTH EVERY DAY, Disp: 90 tablet, Rfl: 1 .  memantine (NAMENDA) 10 MG tablet, TAKE 1 TABLET BY MOUTH TWICE A DAY (Patient taking differently: Take 10 mg by mouth 2 (two) times daily. ), Disp: 180 tablet, Rfl: 1 .  mupirocin ointment (BACTROBAN) 2 %, Apply to wound after soaking BID, Disp: 30 g, Rfl: 1 .  nitroGLYCERIN (NITROSTAT) 0.4 MG SL tablet, Place 1 tablet (0.4 mg total) under the tongue every 5 (five) minutes x 3 doses as needed for chest pain., Disp: 25 tablet, Rfl: 1 .  Omega-3 Fatty Acids  (FISH OIL) 500 MG CAPS, Take 500 mg by mouth 2 (two) times a week. , Disp: , Rfl:  .  selenium 50 MCG TABS tablet, Take 50 mcg by mouth 2 (two) times a week. , Disp: , Rfl:  .  sertraline (ZOLOFT) 25 MG tablet, TAKE 1 TABLET BY MOUTH EVERY DAY (Patient taking differently: Take 25 mg by mouth daily. ), Disp: 90 tablet, Rfl: 2 .  vancomycin (VANCOCIN) 125 MG capsule, Take 125 mg by mouth 4 (four) times daily., Disp: , Rfl:   Past Medical History: Past Medical History:  Diagnosis Date  . Allergy   . Arthritis   . Carpal tunnel syndrome   . CKD (chronic kidney disease), stage III   . Coronary artery disease    a. 03/2018 PCI [facility]): RCA 95p, 9m(2.5x38 [place]); b. 06/2019 PCI: LM 40ost, LAD 85p, 60p/m (atherectomy w/ 3.0x48 Synergy DES p/m LAD), 559mLCX 50d, OM2/3 50, RCA patent prox/mid stent, 50d, RPDA 50.  . Diabetes mellitus without complication ([facility]  . Diastolic dysfunction    a. 06/2019 Echo: EF 60-65%, no rwma, mod LVH, Gr1 DD. Nl RV fxn. Mildly dil LA. Mild-mod TR.  . Diverticulitis   . GERD (gastroesophageal reflux disease)   . Gout   . History of blood transfusion   . History of chicken pox   . Hyperlipidemia   . Hypertension   . Mild dementia ([facility] [place]  . Myocardial infarction ([facility]2020  . Normocytic anemia    a. 06/2019 s/p 1u PRBCs.  . Marland KitchenAD (peripheral artery disease) ([facility]   a. 11/2018 s/p R SFA, popliteal, and peroneal artery PTA.  . Pro[facility] ([facility]   prostate  . Prostate cancer ([facility]  . Syncope     Tobacco Use: Social History   Tobacco Use  Smoking Status Former Smoker  . Packs/day: 0.75  . Years: 12.00  . Pack years: 9.00  . Types: Cigars, Cigarettes  . Quit date: 1982. Years since quitting: 51.5  Smokeless Tobacco Former [employer] developer. Types: Chew  . Quit date: 2016    Labs: Recent Review Flowsheet Data    Labs for ITP Cardiac and Pulmonary Rehab Latest Ref Rng & Units 09/30/2018 10/05/2018 06/15/2019 06/27/2019 07/05/2019    Cholestrol 0 - 200 mg/dL - 116 - 135 -   LDLCALC 0 - 99 mg/dL - 47 - 62 -   HDL >40 mg/dL - 58 - 61 -   Trlycerides <150 mg/dL - 55 - 62 -   Hemoglobin A1c 4.6 - 6.5 % 6.2(A) - 6.7(H) - -   TCO2 22 - 32  mmol/L - - - - 22       Exercise Target Goals: Exercise Program Goal: Individual exercise prescription set using results from initial 6 min walk test and THRR while considering  patient's activity barriers and safety.   Exercise Prescription Goal: Initial exercise prescription builds to 30-45 minutes a day of aerobic activity, 2-3 days per week.  Home exercise guidelines will be given to patient during program as part of exercise prescription that the participant will acknowledge.   Education: Aerobic Exercise & Resistance Training: - Gives group verbal and written instruction on the various components of exercise. Focuses on aerobic and resistive training programs and the benefits of this training and how to safely progress through these programs..   Education: Exercise & Equipment Safety: - Individual verbal instruction and demonstration of equipment use and safety with use of the equipment.   Cardiac Rehab from 07/29/2019 in [facility] Cardiac and Pulmonary Rehab  Date 07/29/19  Educator [facility]  Instruction Review Code 1- Verbalizes Understanding      Education: Exercise Physiology & General Exercise Guidelines: - Group verbal and written instruction with models to review the exercise physiology of the cardiovascular system and associated critical values. Provides general exercise guidelines with specific guidelines to those with heart or lung disease.    Education: Flexibility, Balance, Mind/Body Relaxation: Provides group verbal/written instruction on the benefits of flexibility and balance training, including mind/body exercise modes such as yoga, pilates and tai chi.  Demonstration and skill practice provided.   Activity Barriers & Risk Stratification:  Activity Barriers & Cardiac  Risk Stratification - 07/29/19 1254      Activity Barriers & Cardiac Risk Stratification   Activity Barriers Arthritis;Deconditioning;Muscular Weakness;Shortness of Breath;Joint Problems;Back Problems;Neck/Spine Problems;Balance Concerns;Other (comment)    Comments multiple old injuries, pain management, knees/back/hips/shoulders, r foot drags    Cardiac Risk Stratification Moderate           6 Minute Walk:  6 Minute Walk    Row Name 07/29/19 1249         6 Minute Walk   Phase Initial     Distance 740 feet     Walk Time 5 minutes     # of Rest Breaks 1  55mn     MPH 1.62     METS 0.92     RPE 13     Perceived Dyspnea  2     VO2 Peak 3.21     Symptoms Yes (comment)     Comments SOB, walking into walls, unsteady step     Resting HR 51 bpm     Resting BP 120/64     Resting Oxygen Saturation  99 %     Exercise Oxygen Saturation  during 6 min walk 97 %     Max Ex. HR 73 bpm     Max Ex. BP 152/80     2 Minute Post BP 126/70            Oxygen Initial Assessment:   Oxygen Re-Evaluation:   Oxygen Discharge (Final Oxygen Re-Evaluation):   Initial Exercise Prescription:  Initial Exercise Prescription - 07/29/19 1200      Date of Initial Exercise RX and Referring Provider   Date 07/29/19    Referring Provider End, CHarrell GaveMD      Treadmill   MPH 1.5    Grade 0    Minutes 15    METs 2.15      Recumbant Elliptical   Level 1  RPM 50    Minutes 15    METs 2      REL-XR   Level 1    Speed 50    Minutes 15    METs 2      Prescription Details   Frequency (times per week) 2    Duration Progress to 30 minutes of continuous aerobic without signs/symptoms of physical distress      Intensity   THRR 40-80% of Max Heartrate 84-117    Ratings of Perceived Exertion 11-13    Perceived Dyspnea 0-4      Progression   Progression Continue to progress workloads to maintain intensity without signs/symptoms of physical distress.      Resistance Training    Training Prescription Yes    Weight 3 lbs    Reps 10-15           Perform Capillary Blood Glucose checks as needed.  Exercise Prescription Changes:  Exercise Prescription Changes    Row Name 07/29/19 1200             Response to Exercise   Blood Pressure (Admit) 120/64       Blood Pressure (Exercise) 152/80       Blood Pressure (Exit) 126/70       Heart Rate (Admit) 51 bpm       Heart Rate (Exercise) 73 bpm       Heart Rate (Exit) 52 bpm       Oxygen Saturation (Admit) 99 %       Oxygen Saturation (Exercise) 97 %       Rating of Perceived Exertion (Exercise) 13       Perceived Dyspnea (Exercise) 2       Symptoms SOB, walking into walls, wobbly gait       Comments walk test results              Exercise Comments:   Exercise Goals and Review:  Exercise Goals    Row Name 07/29/19 1309             Exercise Goals   Increase Physical Activity Yes       Intervention Provide advice, education, support and counseling about physical activity/exercise needs.;Develop an individualized exercise prescription for aerobic and resistive training based on initial evaluation findings, risk stratification, comorbidities and participant's personal goals.       Expected Outcomes Short Term: Attend rehab on a regular basis to increase amount of physical activity.;Long Term: Add in home exercise to make exercise part of routine and to increase amount of physical activity.;Long Term: Exercising regularly at least 3-5 days a week.       Increase Strength and Stamina Yes       Intervention Provide advice, education, support and counseling about physical activity/exercise needs.;Develop an individualized exercise prescription for aerobic and resistive training based on initial evaluation findings, risk stratification, comorbidities and participant's personal goals.       Expected Outcomes Short Term: Increase workloads from initial exercise prescription for resistance, speed, and METs.;Short  Term: Perform resistance training exercises routinely during rehab and add in resistance training at home;Long Term: Improve cardiorespiratory fitness, muscular endurance and strength as measured by increased METs and functional capacity (6MWT)       Able to understand and use rate of perceived exertion (RPE) scale Yes       Intervention Provide education and explanation on how to use RPE scale       Expected Outcomes Short Term: Able  to use RPE daily in rehab to express subjective intensity level;Long Term:  Able to use RPE to guide intensity level when exercising independently       Able to understand and use Dyspnea scale Yes       Intervention Provide education and explanation on how to use Dyspnea scale       Expected Outcomes Short Term: Able to use Dyspnea scale daily in rehab to express subjective sense of shortness of breath during exertion;Long Term: Able to use Dyspnea scale to guide intensity level when exercising independently       Knowledge and understanding of Target Heart Rate Range (THRR) Yes       Intervention Provide education and explanation of THRR including how the numbers were predicted and where they are located for reference       Expected Outcomes Short Term: Able to state/look up THRR;Short Term: Able to use daily as guideline for intensity in rehab;Long Term: Able to use THRR to govern intensity when exercising independently       Able to check pulse independently Yes       Intervention Provide education and demonstration on how to check pulse in carotid and radial arteries.;Review the importance of being able to check your own pulse for safety during independent exercise       Expected Outcomes Short Term: Able to explain why pulse checking is important during independent exercise;Long Term: Able to check pulse independently and accurately       Understanding of Exercise Prescription Yes       Intervention Provide education, explanation, and written materials on patient's  individual exercise prescription       Expected Outcomes Short Term: Able to explain program exercise prescription;Long Term: Able to explain home exercise prescription to exercise independently              Exercise Goals Re-Evaluation :  Exercise Goals Re-Evaluation    Row Name 08/23/19 1059             Exercise Goal Re-Evaluation   Comments Has not started full sessions yet              Discharge Exercise Prescription (Final Exercise Prescription Changes):  Exercise Prescription Changes - 07/29/19 1200      Response to Exercise   Blood Pressure (Admit) 120/64    Blood Pressure (Exercise) 152/80    Blood Pressure (Exit) 126/70    Heart Rate (Admit) 51 bpm    Heart Rate (Exercise) 73 bpm    Heart Rate (Exit) 52 bpm    Oxygen Saturation (Admit) 99 %    Oxygen Saturation (Exercise) 97 %    Rating of Perceived Exertion (Exercise) 13    Perceived Dyspnea (Exercise) 2    Symptoms SOB, walking into walls, wobbly gait    Comments walk test results           Nutrition:  Target Goals: Understanding of nutrition guidelines, daily intake of sodium <1569m, cholesterol <2046m calories 30% from fat and 7% or less from saturated fats, daily to have 5 or more servings of fruits and vegetables.  Education: Controlling Sodium/Reading Food Labels -Group verbal and written material supporting the discussion of sodium use in heart healthy nutrition. Review and explanation with models, verbal and written materials for utilization of the food label.   Education: General Nutrition Guidelines/Fats and Fiber: -Group instruction provided by verbal, written material, models and posters to present the general guidelines for heart healthy nutrition. Gives an  explanation and review of dietary fats and fiber.   Biometrics:  Pre Biometrics - 07/29/19 1309      Pre Biometrics   Height 5' 7.5" (1.715 m)    Weight 171 lb 4.8 oz (77.7 kg)    BMI (Calculated) 26.42    Single Leg Stand 1.5  seconds            Nutrition Therapy Plan and Nutrition Goals:   Nutrition Assessments:  Nutrition Assessments - 07/29/19 1310      MEDFICTS Scores   Pre Score 57           MEDIFICTS Score Key:          ?70 Need to make dietary changes          40-70 Heart Healthy Diet         ? 40 Therapeutic Level Cholesterol Diet  Nutrition Goals Re-Evaluation:   Nutrition Goals Discharge (Final Nutrition Goals Re-Evaluation):   Psychosocial: Target Goals: Acknowledge presence or absence of significant depression and/or stress, maximize coping skills, provide positive support system. Participant is able to verbalize types and ability to use techniques and skills needed for reducing stress and depression.   Education: Depression - Provides group verbal and written instruction on the correlation between heart/lung disease and depressed mood, treatment options, and the stigmas associated with seeking treatment.   Education: Sleep Hygiene -Provides group verbal and written instruction about how sleep can affect your health.  Define sleep hygiene, discuss sleep cycles and impact of sleep habits. Review good sleep hygiene tips.     Education: Stress and Anxiety: - Provides group verbal and written instruction about the health risks of elevated stress and causes of high stress.  Discuss the correlation between heart/lung disease and anxiety and treatment options. Review healthy ways to manage with stress and anxiety.    Initial Review & Psychosocial Screening:  Initial Psych Review & Screening - 07/27/19 1415      Initial Review   Current issues with Current Stress Concerns;Current Anxiety/Panic;Current Sleep Concerns    Source of Stress Concerns Chronic Illness;Occupation;Unable to participate in former interests or hobbies;Unable to perform yard/household activities    Comments "Angry" about being retired and not getting out, a little anxiety and interest in wanting to get out and  physcially in bad shape, most night gets about 6 hours (wakes at 130am and up for about an hour)      [place]? Yes   wife, daughter live 10-34mn away, [place](both with families of own)     Barriers   Psychosocial barriers to participate in program Psychosocial barriers identified (see note);The patient should benefit from training in stress management and relaxation.      Screening Interventions   Interventions Encouraged to exercise;To provide support and resources with identified psychosocial needs;Provide feedback about the scores to participant    Expected Outcomes Short Term goal: Utilizing psychosocial counselor, staff and physician to assist with identification of specific Stressors or current issues interfering with healing process. Setting desired goal for each stressor or current issue identified.;Long Term Goal: Stressors or current issues are controlled or eliminated.;Short Term goal: Identification and review with participant of any Quality of Life or Depression concerns found by scoring the questionnaire.;Long Term goal: The participant improves quality of Life and PHQ9 Scores as seen by post scores and/or verbalization of changes           Quality of Life Scores:  Quality of Life - 07/29/19 1310      Quality of Life   Select Quality of Life      Quality of Life Scores   Health/Function Pre 19.03 %    Socioeconomic Pre 25.56 %    Psych/Spiritual Pre 28.57 %    Family Pre 24.7 %    GLOBAL Pre 23.7 %          Scores of 19 and below usually indicate a poorer quality of life in these areas.  A difference of  2-3 points is a clinically meaningful difference.  A difference of 2-3 points in the total score of the Quality of Life Index has been associated with significant improvement in overall quality of life, self-image, physical symptoms, and general health in studies assessing change in quality of life.  PHQ-9: Recent  Review Flowsheet Data    Depression screen [facility] 2/9 07/29/2019 05/31/2019 12/28/2018   Decreased Interest 0 0 0   Down, Depressed, Hopeless 1 0 0   PHQ - 2 Score 1 0 0   Altered sleeping 3 0 -   Tired, decreased energy 3 0 -   Change in appetite 1 0 -   Feeling bad or failure about yourself  1 0 -   Trouble concentrating 0 0 -   Moving slowly or fidgety/restless 1 0 -   Suicidal thoughts 0 0 -   PHQ-9 Score 10 0 -   Difficult doing work/chores Somewhat difficult Not difficult at all -     Interpretation of Total Score  Total Score Depression Severity:  1-4 = Minimal depression, 5-9 = Mild depression, 10-14 = Moderate depression, 15-19 = Moderately severe depression, 20-27 = Severe depression   Psychosocial Evaluation and Intervention:  Psychosocial Evaluation - 07/27/19 1431      Psychosocial Evaluation & Interventions   Interventions Stress management education;Relaxation education;Encouraged to exercise with the program and follow exercise prescription    Comments Peter [name] is coming into cardiac rehab after having another stent placed.  He had two put in last May as well.  His wife is encouraging his attendance, but he is apprehensive as he has not had a lot of success with previous PT rehabs.  He has a history of mulitple injuries that have caused chronic pain issues.  He does have some anxiety issues and does not sleep the best, but he is a straight forward person and tells you how he feels.  His main motivation for coming to rehab is his wife wants him to do it.  She is his main support but they also have daughters that live nearby too. He would like to prevent any future heart events and move around better.  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    Expected Outcomes Short: Attend rehab to build strength back up Long: Continue to improve overall well being.    Continue Psychosocial  Services  Follow up required by staff           Psychosocial Re-Evaluation:   Psychosocial Discharge (Final Psychosocial Re-Evaluation):   Vocational Rehabilitation: Provide vocational rehab assistance to qualifying candidates.   Vocational Rehab Evaluation & Intervention:  Vocational Rehab - 07/27/19 1415      Initial Vocational Rehab Evaluation & Intervention   Assessment shows need for Vocational Rehabilitation No   retired          Education:  Education Goals: Education classes will be provided on a variety of topics geared toward better understanding of heart health and risk factor modification. Participant will state understanding/return demonstration of topics presented as noted by education test scores.  Learning Barriers/Preferences:  Learning Barriers/Preferences - 07/27/19 1414      Learning Barriers/Preferences   Learning Barriers Sight   glasses for reading   Learning Preferences Video;Audio;Written Material;Verbal Instruction           General Cardiac Education Topics:  AED/CPR: - Group verbal and written instruction with the use of models to demonstrate the basic use of the AED with the basic ABC's of resuscitation.   Anatomy & Physiology of the Heart: - Group verbal and written instruction and models provide basic cardiac anatomy and physiology, with the coronary electrical and arterial systems. Review of Valvular disease and Heart Failure   Cardiac Procedures: - Group verbal and written instruction to review commonly prescribed medications for heart disease. Reviews the medication, class of the drug, and side effects. Includes the steps to properly store meds and maintain the prescription regimen. (beta blockers and nitrates)   Cardiac Medications I: - Group verbal and written instruction to review commonly prescribed medications for heart disease. Reviews the medication, class of the drug, and side effects. Includes the steps to properly store  meds and maintain the prescription regimen.   Cardiac Medications II: -Group verbal and written instruction to review commonly prescribed medications for heart disease. Reviews the medication, class of the drug, and side effects. (all other drug classes)    Go Sex-Intimacy & Heart Disease, Get SMART - Goal Setting: - Group verbal and written instruction through game format to discuss heart disease and the return to sexual intimacy. Provides group verbal and written material to discuss and apply goal setting through the application of the S.M.A.R.T. Method.   Other Matters of the Heart: - Provides group verbal, written materials and models to describe Stable Angina and Peripheral Artery. Includes description of the disease process and treatment options available to the cardiac patient.   Infection Prevention: - Provides verbal and written material to individual with discussion of infection control including proper hand washing and proper equipment cleaning during exercise session.   Cardiac Rehab from 07/29/2019 in [facility] Cardiac and Pulmonary Rehab  Date 07/29/19  Educator [facility]  Instruction Review Code 1- Verbalizes Understanding      Falls Prevention: - Provides verbal and written material to individual with discussion of falls prevention and safety.   Cardiac Rehab from 07/29/2019 in [facility] Cardiac and Pulmonary Rehab  Date 07/29/19  Educator [facility]  Instruction Review Code 1- Verbalizes Understanding      Other: -Provides group and verbal instruction on various topics (see comments)   Knowledge Questionnaire Score:  Knowledge Questionnaire Score - 07/29/19 1310      Knowledge Questionnaire Score   Pre Score 22/26 Education Focus: Exercise and Nutrition           Core Components/Risk Factors/Patient Goals at Admission:  Personal Goals and Risk Factors at Admission - 07/29/19 1311      Core Components/Risk Factors/Patient Goals on Admission    Weight Management Yes;Weight  Loss;Obesity    Intervention Weight Management: Develop a combined nutrition and exercise program designed to reach desired caloric intake, while maintaining appropriate intake of nutrient and fiber, sodium and fats, and appropriate energy expenditure required for the weight goal.;Weight Management: Provide education and appropriate resources to help participant work on and attain dietary goals.;Weight Management/Obesity: Establish  reasonable short term and long term weight goals.;Obesity: Provide education and appropriate resources to help participant work on and attain dietary goals.    Admit Weight 171 lb 4.8 oz (77.7 kg)    Goal Weight: Short Term 165 lb (74.8 kg)    Goal Weight: Long Term 160 lb (72.6 kg)    Expected Outcomes Short Term: Continue to assess and modify interventions until short term weight is achieved;Long Term: Adherence to nutrition and physical activity/exercise program aimed toward attainment of established weight goal;Weight Loss: Understanding of general recommendations for a balanced deficit meal plan, which promotes 1-2 lb weight loss per week and includes a negative energy balance of [id number] kcal/d;Understanding recommendations for meals to include 15-35% energy as protein, 25-35% energy from fat, 35-60% energy from carbohydrates, less than 274m of dietary cholesterol, 20-35 gm of total fiber daily;Understanding of distribution of calorie intake throughout the day with the consumption of 4-5 meals/snacks    Diabetes Yes    Intervention Provide education about signs/symptoms and action to take for hypo/hyperglycemia.;Provide education about proper nutrition, including hydration, and aerobic/resistive exercise prescription along with prescribed medications to achieve blood glucose in normal ranges: Fasting glucose 65-99 mg/dL    Expected Outcomes Short Term: Participant verbalizes understanding of the signs/symptoms and immediate care of hyper/hypoglycemia, proper foot care and  importance of medication, aerobic/resistive exercise and nutrition plan for blood glucose control.;Long Term: Attainment of HbA1C < 7%.    Heart Failure Yes    Intervention Provide a combined exercise and nutrition program that is supplemented with education, support and counseling about heart failure. Directed toward relieving symptoms such as shortness of breath, decreased exercise tolerance, and extremity edema.    Expected Outcomes Improve functional capacity of life;Short term: Attendance in program 2-3 days a week with increased exercise capacity. Reported lower sodium intake. Reported increased fruit and vegetable intake. Reports medication compliance.;Short term: Daily weights obtained and reported for increase. Utilizing diuretic protocols set by physician.;Long term: Adoption of self-care skills and reduction of barriers for early signs and symptoms recognition and intervention leading to self-care maintenance.    Hypertension Yes    Intervention Provide education on lifestyle modifcations including regular physical activity/exercise, weight management, moderate sodium restriction and increased consumption of fresh fruit, vegetables, and low fat dairy, alcohol moderation, and smoking cessation.;Monitor prescription use compliance.    Expected Outcomes Short Term: Continued assessment and intervention until BP is < 140/987mHG in hypertensive participants. < 130/807mG in hypertensive participants with diabetes, heart failure or chronic kidney disease.;Long Term: Maintenance of blood pressure at goal levels.    Lipids Yes    Intervention Provide education and support for participant on nutrition & aerobic/resistive exercise along with prescribed medications to achieve LDL <40m27mDL >40mg89m Expected Outcomes Short Term: Participant states understanding of desired cholesterol values and is compliant with medications prescribed. Participant is following exercise prescription and nutrition  guidelines.;Long Term: Cholesterol controlled with medications as prescribed, with individualized exercise RX and with personalized nutrition plan. Value goals: LDL < 40mg,52m > 40 mg.           Education:Diabetes - Individual verbal and written instruction to review signs/symptoms of diabetes, desired ranges of glucose level fasting, after meals and with exercise. Acknowledge that pre and post exercise glucose checks will be done for 3 sessions at entry of program.   [facility]  Date 07/29/19  [facility]ator [facility]-  Verbalizes Understanding      Education: Know Your Numbers and Risk Factors: -Group verbal and written instruction about important numbers in your health.  Discussion of what are risk factors and how they play a role in the disease process.  Review of Cholesterol, Blood Pressure, Diabetes, and BMI and the role they play in your overall health.   Core Components/Risk Factors/Patient Goals Review:    Core Components/Risk Factors/Patient Goals at Discharge (Final Review):    ITP Comments:  ITP Comments    Row Name 07/27/19 1448 07/29/19 1246 08/09/19 1412 08/17/19 1359 08/23/19 1058   ITP Comments Completed virtual orientation today.  EP evaluation is scheduled for Thursday 6/17 at 11am.  Documentation for diagnosis can be found in [facility] encounter 07/05/19. Completed 6MWT and gym orientation.  Initial ITP created and sent for review to Dr. [name] Filbert, Medical Director. Peter [name] has not started yet.  Expected start is 08/17/19. pt called out requesting hold for 2 more weeks, no reason given Still out on hold   [place] Name 08/25/19 0603 09/02/19 0830         ITP Comments 30 Day review completed. Medical Director ITP review done, changes made as directed, and signed approval by Medical Director. Daughter called to let us know that her dad would like to discharge from the program. He only attended orientation               Comments: Discharge ITP

## 2019-09-06 ENCOUNTER — Ambulatory Visit: Payer: Medicare Other | Admitting: Family

## 2019-09-07 ENCOUNTER — Ambulatory Visit: Payer: Medicare Other

## 2019-09-09 ENCOUNTER — Ambulatory Visit: Payer: Medicare Other

## 2019-09-09 ENCOUNTER — Other Ambulatory Visit: Payer: Self-pay

## 2019-09-09 ENCOUNTER — Emergency Department
Admission: EM | Admit: 2019-09-09 | Discharge: 2019-09-09 | Disposition: A | Discharge status: Home / Self Care | Payer: Medicare Other | Attending: Emergency Medicine | Admitting: Emergency Medicine

## 2019-09-09 DIAGNOSIS — Z5321 Procedure and treatment not carried out due to patient leaving prior to being seen by health care provider: Secondary | ICD-10-CM | POA: Insufficient documentation

## 2019-09-09 DIAGNOSIS — R197 Diarrhea, unspecified: Secondary | ICD-10-CM | POA: Insufficient documentation

## 2019-09-09 DIAGNOSIS — R0902 Hypoxemia: Secondary | ICD-10-CM | POA: Diagnosis not present

## 2019-09-09 DIAGNOSIS — I959 Hypotension, unspecified: Secondary | ICD-10-CM | POA: Diagnosis not present

## 2019-09-09 DIAGNOSIS — E860 Dehydration: Secondary | ICD-10-CM | POA: Insufficient documentation

## 2019-09-09 DIAGNOSIS — R001 Bradycardia, unspecified: Secondary | ICD-10-CM | POA: Diagnosis not present

## 2019-09-09 DIAGNOSIS — R61 Generalized hyperhidrosis: Secondary | ICD-10-CM | POA: Diagnosis not present

## 2019-09-09 LAB — BASIC METABOLIC PANEL
Anion gap: 8 (ref 5–15)
BUN: 28 mg/dL — ABNORMAL HIGH (ref 8–23)
CO2: 24 mmol/L (ref 22–32)
Calcium: 8.1 mg/dL — ABNORMAL LOW (ref 8.9–10.3)
Chloride: 107 mmol/L (ref 98–111)
Creatinine, Ser: 1.59 mg/dL — ABNORMAL HIGH (ref 0.61–1.24)
GFR calc Af Amer: 45 mL/min — ABNORMAL LOW (ref >60)
GFR calc non Af Amer: 38 mL/min — ABNORMAL LOW (ref >60)
Glucose, Bld: 147 mg/dL — ABNORMAL HIGH (ref 70–99)
Potassium: 4.8 mmol/L (ref 3.5–5.1)
Sodium: 139 mmol/L (ref 135–145)

## 2019-09-09 LAB — CBC
HCT: 30 % — ABNORMAL LOW (ref 39.0–52.0)
Hemoglobin: 9.4 g/dL — ABNORMAL LOW (ref 13.0–17.0)
MCH: 28.1 pg (ref 26.0–34.0)
MCHC: 31.3 g/dL (ref 30.0–36.0)
MCV: 89.8 fL (ref 80.0–100.0)
Platelets: 253 10*3/uL (ref 150–400)
RBC: 3.34 MIL/uL — ABNORMAL LOW (ref 4.22–5.81)
RDW: 15.2 % (ref 11.5–15.5)
WBC: 9.8 10*3/uL (ref 4.0–10.5)
nRBC: 0 % (ref 0.0–0.2)

## 2019-09-09 MED ORDER — SODIUM CHLORIDE 0.9% FLUSH: 3.0000 mL | Freq: Once | INTRAVENOUS | Status: DC

## 2019-09-09 NOTE — ED Triage Notes (Signed)
See first nurse note. PT speaking with this RN in NAD, skin warm and dry. Pt reports sitting out in the sun when his chair broke and then he had difficulty getting up. Pt states he yelled for help and neighbors helped him up. Pt states his neighbors call EMS because he had been outside for a little while and was worried he was dehydrated.

## 2019-09-09 NOTE — ED Triage Notes (Signed)
First RN Note: Pt presents to ED via [facility] with c/o possibly dehydration. Per EMS pt was sunbathing and his chair broke, and pt was unable to get up. Per EMS reports diarrhea x several days, recently recovering from C-diff.   22g L hand 250cc's fluid  129/49 44SB 100%

## 2019-09-10 ENCOUNTER — Other Ambulatory Visit: Payer: Self-pay | Admitting: Family Medicine

## 2019-09-10 DIAGNOSIS — T148XXA Other injury of unspecified body region, initial encounter: Secondary | ICD-10-CM | POA: Diagnosis not present

## 2019-09-10 DIAGNOSIS — S90121A Contusion of right lesser toe(s) without damage to nail, initial encounter: Secondary | ICD-10-CM | POA: Diagnosis not present

## 2019-09-11 ENCOUNTER — Other Ambulatory Visit: Payer: Self-pay | Admitting: Internal Medicine

## 2019-09-14 ENCOUNTER — Ambulatory Visit: Payer: Medicare Other

## 2019-09-15 ENCOUNTER — Other Ambulatory Visit: Payer: Self-pay

## 2019-09-15 ENCOUNTER — Encounter: Payer: Self-pay | Admitting: Family

## 2019-09-15 ENCOUNTER — Ambulatory Visit (INDEPENDENT_AMBULATORY_CARE_PROVIDER_SITE_OTHER): Payer: Medicare Other | Admitting: Podiatry

## 2019-09-15 ENCOUNTER — Ambulatory Visit (INDEPENDENT_AMBULATORY_CARE_PROVIDER_SITE_OTHER): Payer: Medicare Other | Admitting: Family

## 2019-09-15 ENCOUNTER — Encounter: Payer: Self-pay | Admitting: Podiatry

## 2019-09-15 VITALS — BP 120/60 | HR 47 | Ht 67.0 in | Wt 169.2 lb

## 2019-09-15 DIAGNOSIS — R55 Syncope and collapse: Secondary | ICD-10-CM

## 2019-09-15 DIAGNOSIS — N183 Chronic kidney disease, stage 3 unspecified: Secondary | ICD-10-CM | POA: Diagnosis not present

## 2019-09-15 DIAGNOSIS — I200 Unstable angina: Secondary | ICD-10-CM

## 2019-09-15 DIAGNOSIS — I10 Essential (primary) hypertension: Secondary | ICD-10-CM | POA: Diagnosis not present

## 2019-09-15 DIAGNOSIS — I25118 Atherosclerotic heart disease of native coronary artery with other forms of angina pectoris: Secondary | ICD-10-CM | POA: Diagnosis not present

## 2019-09-15 DIAGNOSIS — I739 Peripheral vascular disease, unspecified: Secondary | ICD-10-CM | POA: Diagnosis not present

## 2019-09-15 DIAGNOSIS — E785 Hyperlipidemia, unspecified: Secondary | ICD-10-CM | POA: Diagnosis not present

## 2019-09-15 DIAGNOSIS — S91109A Unspecified open wound of unspecified toe(s) without damage to nail, initial encounter: Secondary | ICD-10-CM

## 2019-09-15 MED ORDER — CARVEDILOL 3.125 MG PO TABS
3.1250 mg | ORAL_TABLET | Freq: Two times a day (BID) | ORAL | 1 refills | Status: DC
Start: 2019-09-15 — End: 2019-11-17

## 2019-09-15 MED ORDER — SILVER SULFADIAZINE 1 % EX CREA
1.0000 | TOPICAL_CREAM | Freq: Every day | CUTANEOUS | 1 refills | Status: DC
Start: 2019-09-15 — End: 2019-10-08
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
## 2019-09-15 NOTE — Patient Instructions (Addendum)
Medication Instructions:  Your physician has recommended you make the following change in your medication:  CHANGE Carvedilol (Coreg) to 3.125mg  twice daily  We have reduced your dose to try to prevent low heart rates.   You may use up your 6.25mg  tablet by taking a half tablet twice per day if you would like.  We have sent a new Rx for to your pharmacy.  *If you need a refill on your cardiac medications before your next appointment, please call your pharmacy*  Lab Work: No lab work today.   Testing/Procedures: Your EKG today showed sinus bradycardia which a slow but regular heart beat.   Follow-Up: At [facility], you and your health needs are our priority.  As part of our continuing mission to provide you with exceptional heart care, we have created designated Provider Care Teams.  These Care Teams include your primary Cardiologist (physician) and Advanced Practice Providers (APPs -  Physician Assistants and Nurse Practitioners) who all work together to provide you with the care you need, when you need it.  We recommend signing up for the patient portal called "MyChart".  Sign up information is provided on this After Visit Summary.  MyChart is used to connect with patients for Virtual Visits (Telemedicine).  Patients are able to view lab/test results, encounter notes, upcoming appointments, etc.  Non-urgent messages can be sent to your provider as well.   To learn more about what you can do with MyChart, go to [email or web address].    Your next appointment:   2 month(s)  The format for your next appointment:   In Person  Provider:   You may see [name], MD or one of the following Advanced Practice Providers on your designated Care Team:    Murray Hodgkins, NP  Christell Faith, PA-C  Marrianne Mood, PA-C  Laurann Montana, NP  Other Instructions  Recommend adding some fluids in the middle of your day to help prevent you from getting dehydration.
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
## 2019-09-16 ENCOUNTER — Ambulatory Visit: Payer: Medicare Other

## 2019-09-17 ENCOUNTER — Other Ambulatory Visit (INDEPENDENT_AMBULATORY_CARE_PROVIDER_SITE_OTHER): Payer: Self-pay | Admitting: Nurse Practitioner

## 2019-09-17 DIAGNOSIS — I70223 Atherosclerosis of native arteries of extremities with rest pain, bilateral legs: Secondary | ICD-10-CM

## 2019-09-18 ENCOUNTER — Other Ambulatory Visit: Payer: Self-pay | Admitting: Family Medicine

## 2019-09-20 ENCOUNTER — Ambulatory Visit (INDEPENDENT_AMBULATORY_CARE_PROVIDER_SITE_OTHER): Payer: Medicare Other

## 2019-09-20 ENCOUNTER — Ambulatory Visit (INDEPENDENT_AMBULATORY_CARE_PROVIDER_SITE_OTHER): Payer: Medicare Other | Admitting: Vascular Surgery

## 2019-09-20 ENCOUNTER — Other Ambulatory Visit: Payer: Self-pay

## 2019-09-20 ENCOUNTER — Encounter (INDEPENDENT_AMBULATORY_CARE_PROVIDER_SITE_OTHER): Payer: Self-pay | Admitting: Vascular Surgery

## 2019-09-20 VITALS — BP 143/51 | HR 55 | Resp 16 | Wt 172.2 lb

## 2019-09-20 DIAGNOSIS — E782 Mixed hyperlipidemia: Secondary | ICD-10-CM

## 2019-09-20 DIAGNOSIS — I70223 Atherosclerosis of native arteries of extremities with rest pain, bilateral legs: Secondary | ICD-10-CM

## 2019-09-20 DIAGNOSIS — Z794 Long term (current) use of insulin: Secondary | ICD-10-CM | POA: Diagnosis not present

## 2019-09-20 DIAGNOSIS — I2510 Atherosclerotic heart disease of native coronary artery without angina pectoris: Secondary | ICD-10-CM

## 2019-09-20 DIAGNOSIS — I200 Unstable angina: Secondary | ICD-10-CM | POA: Diagnosis not present

## 2019-09-20 DIAGNOSIS — I10 Essential (primary) hypertension: Secondary | ICD-10-CM | POA: Diagnosis not present

## 2019-09-20 DIAGNOSIS — N183 Chronic kidney disease, stage 3 unspecified: Secondary | ICD-10-CM

## 2019-09-20 DIAGNOSIS — E1122 Type 2 diabetes mellitus with diabetic chronic kidney disease: Secondary | ICD-10-CM

## 2019-09-21 ENCOUNTER — Other Ambulatory Visit: Payer: Self-pay | Admitting: Internal Medicine

## 2019-09-21 ENCOUNTER — Ambulatory Visit: Payer: Medicare Other

## 2019-09-23 ENCOUNTER — Telehealth (INDEPENDENT_AMBULATORY_CARE_PROVIDER_SITE_OTHER): Payer: Self-pay

## 2019-09-23 ENCOUNTER — Ambulatory Visit: Payer: Medicare Other

## 2019-09-23 NOTE — Telephone Encounter (Signed)
Called and spoke with the patient and his spouse and they want to wait for now before scheduling his right leg angio with Dr. Delana Meyer. It was explained to the patient that if the wait time is too long they may need to be seen again before scheduling and he was okay with that.

## 2019-09-26 ENCOUNTER — Encounter (INDEPENDENT_AMBULATORY_CARE_PROVIDER_SITE_OTHER): Payer: Self-pay | Admitting: Vascular Surgery
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
## 2019-09-28 ENCOUNTER — Other Ambulatory Visit
Admission: RE | Admit: 2019-09-28 | Discharge: 2019-09-28 | Disposition: A | Discharge status: Home / Self Care | Payer: Medicare Other | Source: Ambulatory Visit | Attending: Gastroenterology | Admitting: Gastroenterology

## 2019-09-28 ENCOUNTER — Ambulatory Visit: Payer: Medicare Other

## 2019-09-28 DIAGNOSIS — M25661 Stiffness of right knee, not elsewhere classified: Secondary | ICD-10-CM | POA: Diagnosis not present

## 2019-09-28 DIAGNOSIS — R202 Paresthesia of skin: Secondary | ICD-10-CM | POA: Diagnosis not present

## 2019-09-28 DIAGNOSIS — R159 Full incontinence of feces: Secondary | ICD-10-CM | POA: Diagnosis not present

## 2019-09-28 DIAGNOSIS — R29898 Other symptoms and signs involving the musculoskeletal system: Secondary | ICD-10-CM | POA: Diagnosis not present

## 2019-09-28 DIAGNOSIS — R197 Diarrhea, unspecified: Secondary | ICD-10-CM | POA: Diagnosis not present

## 2019-09-28 DIAGNOSIS — R200 Anesthesia of skin: Secondary | ICD-10-CM | POA: Diagnosis not present

## 2019-09-28 LAB — C DIFFICILE QUICK SCREEN W PCR REFLEX
C Diff antigen: NEGATIVE — AB
C Diff interpretation: NOT DETECTED
C Diff toxin: NEGATIVE — AB

## 2019-09-29 DIAGNOSIS — S82101A Unspecified fracture of upper end of right tibia, initial encounter for closed fracture: Secondary | ICD-10-CM | POA: Diagnosis not present

## 2019-09-29 DIAGNOSIS — M4300 Spondylolysis, site unspecified: Secondary | ICD-10-CM | POA: Diagnosis not present

## 2019-09-29 DIAGNOSIS — M1711 Unilateral primary osteoarthritis, right knee: Secondary | ICD-10-CM | POA: Diagnosis not present

## 2019-09-30 ENCOUNTER — Ambulatory Visit: Payer: Medicare Other

## 2019-09-30 LAB — CALPROTECTIN, FECAL: Calprotectin, Fecal: 58 ug/g (ref 0–120)

## 2019-10-01 ENCOUNTER — Telehealth: Payer: Self-pay | Admitting: Family Medicine

## 2019-10-01 NOTE — Telephone Encounter (Signed)
Pt needs new prescription for syringes sent [facility].

## 2019-10-01 NOTE — Telephone Encounter (Signed)
After speaking to wife, I called [facility] to check on the status of the pt's syringe Rx and they had refills on file and would be filling today.

## 2019-10-02 ENCOUNTER — Other Ambulatory Visit: Payer: Self-pay | Admitting: Cardiology

## 2019-10-04 ENCOUNTER — Telehealth (INDEPENDENT_AMBULATORY_CARE_PROVIDER_SITE_OTHER): Payer: Self-pay

## 2019-10-04 DIAGNOSIS — M25661 Stiffness of right knee, not elsewhere classified: Secondary | ICD-10-CM | POA: Insufficient documentation

## 2019-10-04 DIAGNOSIS — R29898 Other symptoms and signs involving the musculoskeletal system: Secondary | ICD-10-CM | POA: Insufficient documentation

## 2019-10-04 DIAGNOSIS — R202 Paresthesia of skin: Secondary | ICD-10-CM | POA: Insufficient documentation

## 2019-10-04 LAB — STOOL CULTURE: E coli, Shiga toxin Assay: NEGATIVE

## 2019-10-04 LAB — STOOL CULTURE REFLEX - RSASHR

## 2019-10-04 LAB — STOOL CULTURE REFLEX - CMPCXR

## 2019-10-04 NOTE — Telephone Encounter (Signed)
Spoke with the patient's wife and he is scheduled with Dr. Delana Meyer for a right leg angio on 10/19/19 with a 11:00 am arrival time to the [facility]. Covid testing on 10/15/19 between 8-1 pm at the [facility]. Pre-procedure instructions were discussed and will be mailed.

## 2019-10-04 NOTE — Telephone Encounter (Signed)
This is [place] pt, Dr. Saunders Revel

## 2019-10-05 ENCOUNTER — Ambulatory Visit: Payer: Medicare Other

## 2019-10-05 ENCOUNTER — Encounter (INDEPENDENT_AMBULATORY_CARE_PROVIDER_SITE_OTHER): Payer: Self-pay

## 2019-10-05 ENCOUNTER — Encounter: Payer: Self-pay | Admitting: Podiatry

## 2019-10-05 DIAGNOSIS — G8929 Other chronic pain: Secondary | ICD-10-CM | POA: Diagnosis not present

## 2019-10-05 DIAGNOSIS — R29898 Other symptoms and signs involving the musculoskeletal system: Secondary | ICD-10-CM | POA: Diagnosis not present

## 2019-10-05 DIAGNOSIS — R413 Other amnesia: Secondary | ICD-10-CM | POA: Diagnosis not present

## 2019-10-05 DIAGNOSIS — I639 Cerebral infarction, unspecified: Secondary | ICD-10-CM | POA: Diagnosis not present

## 2019-10-05 DIAGNOSIS — R531 Weakness: Secondary | ICD-10-CM | POA: Diagnosis not present

## 2019-10-05 DIAGNOSIS — M545 Low back pain: Secondary | ICD-10-CM | POA: Diagnosis not present

## 2019-10-06 ENCOUNTER — Other Ambulatory Visit: Payer: Self-pay

## 2019-10-06 ENCOUNTER — Emergency Department
Admission: EM | Admit: 2019-10-06 | Discharge: 2019-10-06 | Disposition: A | Discharge status: Home / Self Care | Payer: Medicare Other | Attending: Emergency Medicine | Admitting: Emergency Medicine

## 2019-10-06 ENCOUNTER — Ambulatory Visit: Payer: Medicare Other | Admitting: Internal Medicine

## 2019-10-06 ENCOUNTER — Emergency Department: Payer: Medicare Other

## 2019-10-06 ENCOUNTER — Other Ambulatory Visit (INDEPENDENT_AMBULATORY_CARE_PROVIDER_SITE_OTHER): Payer: Self-pay | Admitting: Nurse Practitioner

## 2019-10-06 DIAGNOSIS — F0390 Unspecified dementia without behavioral disturbance: Secondary | ICD-10-CM | POA: Insufficient documentation

## 2019-10-06 DIAGNOSIS — Z794 Long term (current) use of insulin: Secondary | ICD-10-CM | POA: Insufficient documentation

## 2019-10-06 DIAGNOSIS — Z87891 Personal history of nicotine dependence: Secondary | ICD-10-CM | POA: Insufficient documentation

## 2019-10-06 DIAGNOSIS — M79671 Pain in right foot: Secondary | ICD-10-CM | POA: Diagnosis not present

## 2019-10-06 DIAGNOSIS — M7989 Other specified soft tissue disorders: Secondary | ICD-10-CM | POA: Diagnosis not present

## 2019-10-06 DIAGNOSIS — I129 Hypertensive chronic kidney disease with stage 1 through stage 4 chronic kidney disease, or unspecified chronic kidney disease: Secondary | ICD-10-CM | POA: Diagnosis not present

## 2019-10-06 DIAGNOSIS — Z4800 Encounter for change or removal of nonsurgical wound dressing: Secondary | ICD-10-CM | POA: Insufficient documentation

## 2019-10-06 DIAGNOSIS — I2510 Atherosclerotic heart disease of native coronary artery without angina pectoris: Secondary | ICD-10-CM | POA: Diagnosis not present

## 2019-10-06 DIAGNOSIS — I739 Peripheral vascular disease, unspecified: Secondary | ICD-10-CM

## 2019-10-06 DIAGNOSIS — Z4802 Encounter for removal of sutures: Secondary | ICD-10-CM | POA: Diagnosis not present

## 2019-10-06 DIAGNOSIS — E1122 Type 2 diabetes mellitus with diabetic chronic kidney disease: Secondary | ICD-10-CM | POA: Insufficient documentation

## 2019-10-06 DIAGNOSIS — N183 Chronic kidney disease, stage 3 unspecified: Secondary | ICD-10-CM | POA: Diagnosis not present

## 2019-10-06 DIAGNOSIS — Z8546 Personal history of malignant neoplasm of prostate: Secondary | ICD-10-CM | POA: Insufficient documentation

## 2019-10-06 DIAGNOSIS — Z79899 Other long term (current) drug therapy: Secondary | ICD-10-CM | POA: Insufficient documentation

## 2019-10-06 DIAGNOSIS — S91301A Unspecified open wound, right foot, initial encounter: Secondary | ICD-10-CM | POA: Diagnosis not present

## 2019-10-06 DIAGNOSIS — Z7982 Long term (current) use of aspirin: Secondary | ICD-10-CM | POA: Diagnosis not present

## 2019-10-06 DIAGNOSIS — Z5189 Encounter for other specified aftercare: Secondary | ICD-10-CM

## 2019-10-06 LAB — COMPREHENSIVE METABOLIC PANEL
ALT: 31 U/L (ref 0–44)
AST: 27 U/L (ref 15–41)
Albumin: 3.2 g/dL — ABNORMAL LOW (ref 3.5–5.0)
Alkaline Phosphatase: 72 U/L (ref 38–126)
Anion gap: 10 (ref 5–15)
BUN: 32 mg/dL — ABNORMAL HIGH (ref 8–23)
CO2: 23 mmol/L (ref 22–32)
Calcium: 8 mg/dL — ABNORMAL LOW (ref 8.9–10.3)
Chloride: 106 mmol/L (ref 98–111)
Creatinine, Ser: 1.42 mg/dL — ABNORMAL HIGH (ref 0.61–1.24)
GFR calc Af Amer: 51 mL/min — ABNORMAL LOW (ref >60)
GFR calc non Af Amer: 44 mL/min — ABNORMAL LOW (ref >60)
Glucose, Bld: 124 mg/dL — ABNORMAL HIGH (ref 70–99)
Potassium: 3.9 mmol/L (ref 3.5–5.1)
Sodium: 139 mmol/L (ref 135–145)
Total Bilirubin: 0.6 mg/dL (ref 0.3–1.2)
Total Protein: 6.1 g/dL — ABNORMAL LOW (ref 6.5–8.1)

## 2019-10-06 LAB — CBC WITH DIFFERENTIAL/PLATELET
Abs Immature Granulocytes: 0.03 10*3/uL (ref 0.00–0.07)
Basophils Absolute: 0.1 10*3/uL (ref 0.0–0.1)
Basophils Relative: 1 %
Eosinophils Absolute: 0.1 10*3/uL (ref 0.0–0.5)
Eosinophils Relative: 1 %
HCT: 24.3 % — ABNORMAL LOW (ref 39.0–52.0)
Hemoglobin: 7.8 g/dL — ABNORMAL LOW (ref 13.0–17.0)
Immature Granulocytes: 0 %
Lymphocytes Relative: 19 %
Lymphs Abs: 1.7 10*3/uL (ref 0.7–4.0)
MCH: 27.5 pg (ref 26.0–34.0)
MCHC: 32.1 g/dL (ref 30.0–36.0)
MCV: 85.6 fL (ref 80.0–100.0)
Monocytes Absolute: 0.7 10*3/uL (ref 0.1–1.0)
Monocytes Relative: 7 %
Neutro Abs: 6.4 10*3/uL (ref 1.7–7.7)
Neutrophils Relative %: 72 %
Platelets: 310 10*3/uL (ref 150–400)
RBC: 2.84 MIL/uL — ABNORMAL LOW (ref 4.22–5.81)
RDW: 14.4 % (ref 11.5–15.5)
WBC: 9 10*3/uL (ref 4.0–10.5)
nRBC: 0 % (ref 0.0–0.2)

## 2019-10-06 LAB — LACTIC ACID, PLASMA: Lactic Acid, Venous: 0.9 mmol/L (ref 0.5–1.9)

## 2019-10-06 IMAGING — DX DG FOOT 2V*R*
2 series · 2 of 2 positions shown · non-contrast
Comparison: [DATE]

CLINICAL DATA: Left foot wound

EXAM:
RIGHT FOOT - 2 VIEW

[foot ap]
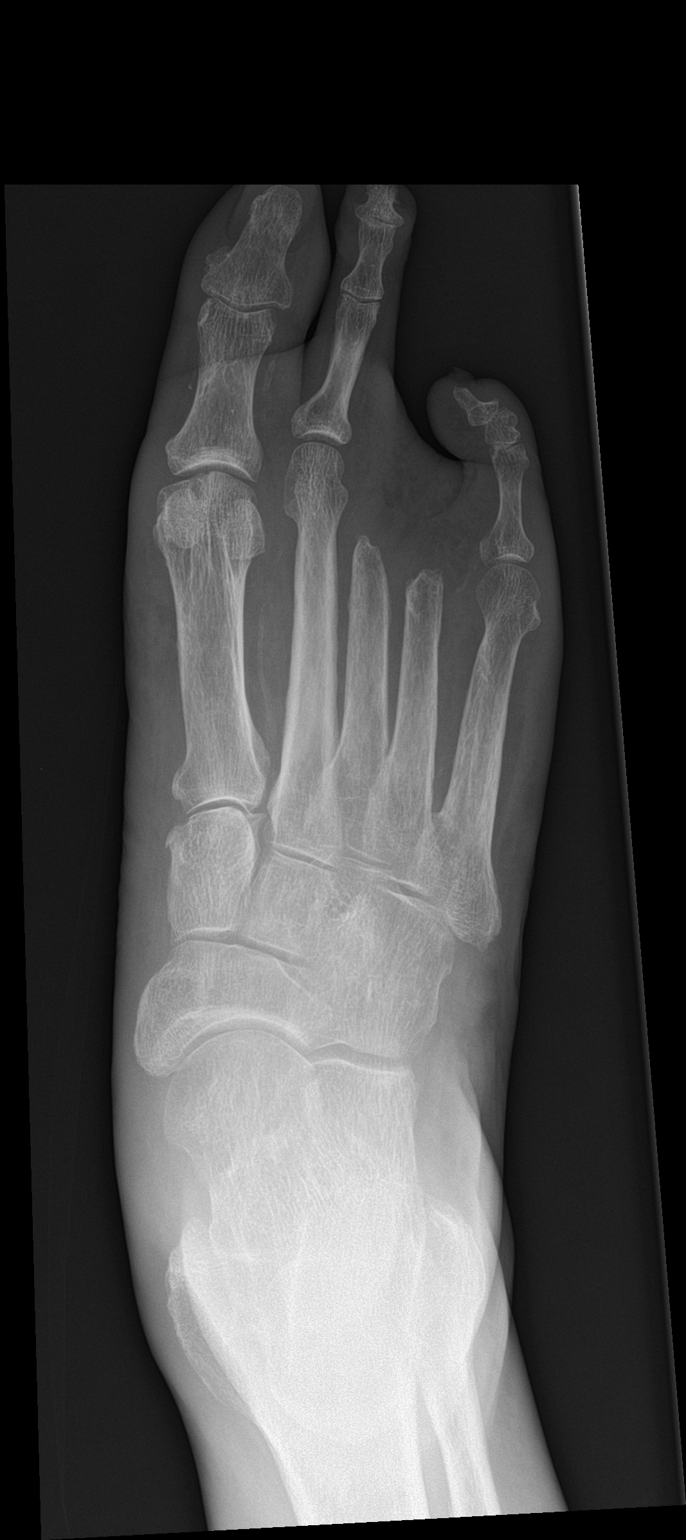

[foot lat]
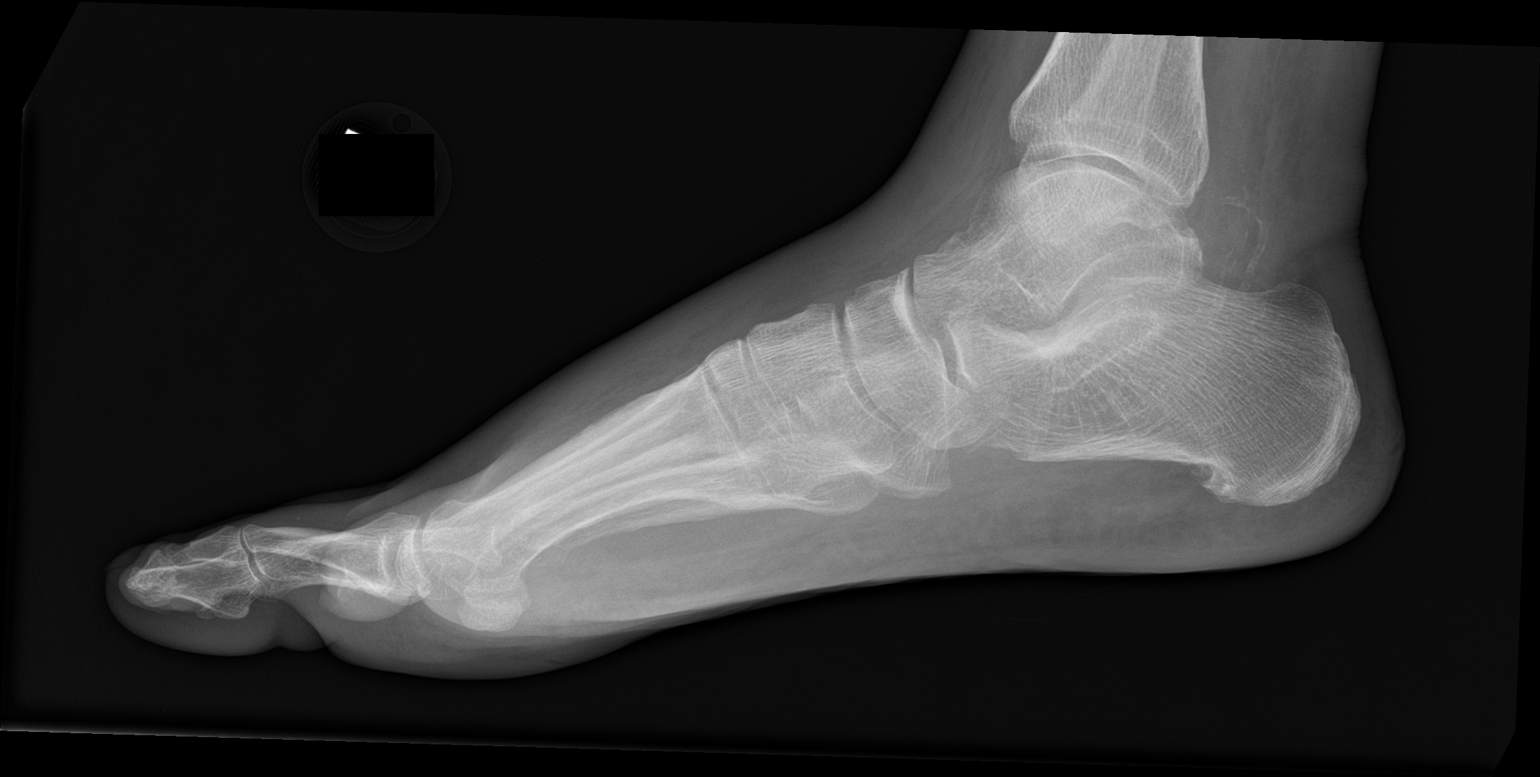

[2 of 2 positions shown; findings below may reference images not displayed]

FINDINGS: Patient is status post third and fourth ray amputations at the level
of the metatarsal necks. Resection margins are well-defined without
evidence to suggest osteomyelitis. Remaining osseous structures
appear intact. No cortical destruction or periostitis. No fracture
or dislocation. Mild diffuse soft tissue swelling. No soft tissue
gas. Vascular calcifications noted.
IMPRESSION: 1. No radiographic evidence to suggest osteomyelitis.
2. Prior third and fourth ray amputations at the level of the
metatarsal necks.

## 2019-10-06 MED ORDER — ONDANSETRON 4 MG PO TBDP: 4.0000 mg | ORAL_TABLET | Freq: Three times a day (TID) | ORAL | 0 refills | Status: AC | PRN

## 2019-10-06 MED ORDER — ONDANSETRON 4 MG PO TBDP: 4.0000 mg | ORAL_TABLET | Freq: Once | ORAL | Status: DC

## 2019-10-06 MED ORDER — TRAMADOL HCL 50 MG PO TABS: 50.0000 mg | ORAL_TABLET | Freq: Once | ORAL | Status: DC

## 2019-10-06 MED ORDER — TRAMADOL HCL 50 MG PO TABS
50.0000 mg | ORAL_TABLET | Freq: Four times a day (QID) | ORAL | 0 refills | Status: AC | PRN
Start: 2019-10-06 — End: 2019-10-09

## 2019-10-06 NOTE — ED Triage Notes (Signed)
Pt here with wound to left foot that has not been seen by home health. Pt has been caring for the wound himself but it beginning to become swollen and darkened. Pt's daughter is with pt as well.
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
## 2019-10-06 NOTE — Telephone Encounter (Signed)
Received a message from patient's daughter wanting the patient moved up on the schedule for his right leg angio.   The patient has been moved to 10/13/19 from 10/19/19 with a 11:15 am arrival time to the [facility]. Covid testing on 10/11/19 between 8-1 pm at the [facility].  This procedure was moved due to the patient having fell and having a wound. Patient was offered 10/12/19 twice but declined both times so was scheduled for 10/13/19.

## 2019-10-07 ENCOUNTER — Ambulatory Visit: Payer: Medicare Other

## 2019-10-07 ENCOUNTER — Ambulatory Visit (INDEPENDENT_AMBULATORY_CARE_PROVIDER_SITE_OTHER): Payer: Medicare Other | Admitting: Vascular Surgery

## 2019-10-07 ENCOUNTER — Other Ambulatory Visit (HOSPITAL_COMMUNITY): Payer: Self-pay | Admitting: Neurology

## 2019-10-07 ENCOUNTER — Other Ambulatory Visit: Payer: Self-pay | Admitting: Neurology

## 2019-10-07 ENCOUNTER — Other Ambulatory Visit: Payer: Self-pay

## 2019-10-07 ENCOUNTER — Other Ambulatory Visit
Admission: RE | Admit: 2019-10-07 | Discharge: 2019-10-07 | Disposition: A | Discharge status: Home / Self Care | Payer: Medicare Other | Source: Ambulatory Visit | Attending: Vascular Surgery | Admitting: Vascular Surgery

## 2019-10-07 DIAGNOSIS — Z20822 Contact with and (suspected) exposure to covid-19: Secondary | ICD-10-CM | POA: Diagnosis not present

## 2019-10-07 DIAGNOSIS — R197 Diarrhea, unspecified: Secondary | ICD-10-CM | POA: Diagnosis not present

## 2019-10-07 DIAGNOSIS — Z01812 Encounter for preprocedural laboratory examination: Secondary | ICD-10-CM | POA: Diagnosis not present

## 2019-10-07 DIAGNOSIS — M160 Bilateral primary osteoarthritis of hip: Secondary | ICD-10-CM | POA: Diagnosis not present

## 2019-10-07 DIAGNOSIS — M47816 Spondylosis without myelopathy or radiculopathy, lumbar region: Secondary | ICD-10-CM | POA: Diagnosis not present

## 2019-10-07 DIAGNOSIS — I700 Atherosclerosis of aorta: Secondary | ICD-10-CM | POA: Diagnosis not present

## 2019-10-07 DIAGNOSIS — R159 Full incontinence of feces: Secondary | ICD-10-CM | POA: Diagnosis not present

## 2019-10-07 DIAGNOSIS — I639 Cerebral infarction, unspecified: Secondary | ICD-10-CM

## 2019-10-07 LAB — SARS CORONAVIRUS 2 (TAT 6-24 HRS): SARS Coronavirus 2: NEGATIVE

## 2019-10-07 NOTE — Telephone Encounter (Signed)
Per Eulogio Ditch NP patient was rescheduled from 10/19/19 for a right leg angio to 10/13/19 with a 11:15 am arrival time with Dr. Delana Meyer. This change was because the patient's daughter sent in a message regarding his toes having open wounds. The patient was offered 10/12/19 but declined. I was told today to schedule the patient for Dr. Lucky Cowboy for 10/08/19 with a 12:00 pm arrival time to the [facility]. Patient will do covid testing on 10/07/19 before 11:00 am today. I spoke with the patient's wife and she was given the information and stated she  understood.

## 2019-10-08 ENCOUNTER — Encounter: Payer: Self-pay | Admitting: Family Medicine

## 2019-10-08 ENCOUNTER — Ambulatory Visit
Admission: RE | Admit: 2019-10-08 | Discharge: 2019-10-08 | Disposition: A | Discharge status: Home / Self Care | Payer: Medicare Other | Attending: Vascular Surgery | Admitting: Vascular Surgery

## 2019-10-08 ENCOUNTER — Other Ambulatory Visit: Payer: Self-pay

## 2019-10-08 ENCOUNTER — Inpatient Hospital Stay: Admit: 2019-10-08 | Payer: Medicare Other

## 2019-10-08 ENCOUNTER — Encounter: Payer: Self-pay | Admitting: Vascular Surgery

## 2019-10-08 ENCOUNTER — Other Ambulatory Visit (INDEPENDENT_AMBULATORY_CARE_PROVIDER_SITE_OTHER): Payer: Self-pay | Admitting: Nurse Practitioner

## 2019-10-08 ENCOUNTER — Encounter
Admission: RE | Disposition: A | Discharge status: Home / Self Care | Payer: Self-pay | Source: Home / Self Care | Attending: Vascular Surgery

## 2019-10-08 DIAGNOSIS — L97519 Non-pressure chronic ulcer of other part of right foot with unspecified severity: Secondary | ICD-10-CM | POA: Insufficient documentation

## 2019-10-08 DIAGNOSIS — Z7983 Long term (current) use of bisphosphonates: Secondary | ICD-10-CM | POA: Diagnosis not present

## 2019-10-08 DIAGNOSIS — Z7902 Long term (current) use of antithrombotics/antiplatelets: Secondary | ICD-10-CM | POA: Insufficient documentation

## 2019-10-08 DIAGNOSIS — Z79899 Other long term (current) drug therapy: Secondary | ICD-10-CM | POA: Diagnosis not present

## 2019-10-08 DIAGNOSIS — N183 Chronic kidney disease, stage 3 unspecified: Secondary | ICD-10-CM | POA: Diagnosis not present

## 2019-10-08 DIAGNOSIS — Z8619 Personal history of other infectious and parasitic diseases: Secondary | ICD-10-CM | POA: Insufficient documentation

## 2019-10-08 DIAGNOSIS — I129 Hypertensive chronic kidney disease with stage 1 through stage 4 chronic kidney disease, or unspecified chronic kidney disease: Secondary | ICD-10-CM | POA: Diagnosis not present

## 2019-10-08 DIAGNOSIS — Z8249 Family history of ischemic heart disease and other diseases of the circulatory system: Secondary | ICD-10-CM | POA: Diagnosis not present

## 2019-10-08 DIAGNOSIS — E1122 Type 2 diabetes mellitus with diabetic chronic kidney disease: Secondary | ICD-10-CM | POA: Insufficient documentation

## 2019-10-08 DIAGNOSIS — M109 Gout, unspecified: Secondary | ICD-10-CM | POA: Diagnosis not present

## 2019-10-08 DIAGNOSIS — Z794 Long term (current) use of insulin: Secondary | ICD-10-CM | POA: Diagnosis not present

## 2019-10-08 DIAGNOSIS — Z955 Presence of coronary angioplasty implant and graft: Secondary | ICD-10-CM | POA: Diagnosis not present

## 2019-10-08 DIAGNOSIS — Z880 Allergy status to penicillin: Secondary | ICD-10-CM | POA: Diagnosis not present

## 2019-10-08 DIAGNOSIS — Z87891 Personal history of nicotine dependence: Secondary | ICD-10-CM | POA: Insufficient documentation

## 2019-10-08 DIAGNOSIS — I70229 Atherosclerosis of native arteries of extremities with rest pain, unspecified extremity: Secondary | ICD-10-CM

## 2019-10-08 DIAGNOSIS — I639 Cerebral infarction, unspecified: Secondary | ICD-10-CM | POA: Insufficient documentation

## 2019-10-08 DIAGNOSIS — Z7982 Long term (current) use of aspirin: Secondary | ICD-10-CM | POA: Diagnosis not present

## 2019-10-08 DIAGNOSIS — I2510 Atherosclerotic heart disease of native coronary artery without angina pectoris: Secondary | ICD-10-CM | POA: Diagnosis not present

## 2019-10-08 DIAGNOSIS — I252 Old myocardial infarction: Secondary | ICD-10-CM | POA: Diagnosis not present

## 2019-10-08 DIAGNOSIS — I70239 Atherosclerosis of native arteries of right leg with ulceration of unspecified site: Secondary | ICD-10-CM | POA: Diagnosis not present

## 2019-10-08 DIAGNOSIS — E11621 Type 2 diabetes mellitus with foot ulcer: Secondary | ICD-10-CM | POA: Insufficient documentation

## 2019-10-08 DIAGNOSIS — M1990 Unspecified osteoarthritis, unspecified site: Secondary | ICD-10-CM | POA: Insufficient documentation

## 2019-10-08 DIAGNOSIS — I70235 Atherosclerosis of native arteries of right leg with ulceration of other part of foot: Secondary | ICD-10-CM | POA: Insufficient documentation

## 2019-10-08 DIAGNOSIS — G5600 Carpal tunnel syndrome, unspecified upper limb: Secondary | ICD-10-CM | POA: Insufficient documentation

## 2019-10-08 DIAGNOSIS — D649 Anemia, unspecified: Secondary | ICD-10-CM | POA: Diagnosis not present

## 2019-10-08 DIAGNOSIS — F0390 Unspecified dementia without behavioral disturbance: Secondary | ICD-10-CM | POA: Diagnosis not present

## 2019-10-08 DIAGNOSIS — Z833 Family history of diabetes mellitus: Secondary | ICD-10-CM | POA: Insufficient documentation

## 2019-10-08 DIAGNOSIS — E782 Mixed hyperlipidemia: Secondary | ICD-10-CM | POA: Insufficient documentation

## 2019-10-08 HISTORY — PX: LOWER EXTREMITY ANGIOGRAPHY: CATH118251

## 2019-10-08 LAB — CREATININE, SERUM
Creatinine, Ser: 1.61 mg/dL — ABNORMAL HIGH (ref 0.61–1.24)
GFR calc Af Amer: 44 mL/min — ABNORMAL LOW (ref >60)
GFR calc non Af Amer: 38 mL/min — ABNORMAL LOW (ref >60)

## 2019-10-08 LAB — GLUCOSE, CAPILLARY
Glucose-Capillary: 111 mg/dL — ABNORMAL HIGH (ref 70–99)
Glucose-Capillary: 77 mg/dL (ref 70–99)

## 2019-10-08 LAB — BUN: BUN: 36 mg/dL — ABNORMAL HIGH (ref 8–23)

## 2019-10-08 SURGERY — LOWER EXTREMITY ANGIOGRAPHY
Anesthesia: Moderate Sedation | Laterality: Right

## 2019-10-08 MED ORDER — SODIUM CHLORIDE 0.9 % IV SOLN: 250.0000 mL | INTRAVENOUS | Status: DC | PRN

## 2019-10-08 MED ORDER — HEPARIN SODIUM (PORCINE) 1000 UNIT/ML IJ SOLN
INTRAMUSCULAR | Status: AC
  Filled 2019-10-08: qty 1

## 2019-10-08 MED ORDER — MIDAZOLAM HCL 2 MG/ML PO SYRP
ORAL_SOLUTION | ORAL | Status: AC
  Filled 2019-10-08: qty 4

## 2019-10-08 MED ORDER — SODIUM CHLORIDE 0.9 % IV SOLN: INTRAVENOUS | Status: DC

## 2019-10-08 MED ORDER — SODIUM CHLORIDE 0.9% FLUSH: 3.0000 mL | Freq: Two times a day (BID) | INTRAVENOUS | Status: DC

## 2019-10-08 MED ORDER — FENTANYL CITRATE (PF) 100 MCG/2ML IJ SOLN
INTRAMUSCULAR | Status: AC
  Filled 2019-10-08: qty 2

## 2019-10-08 MED ORDER — HEPARIN SODIUM (PORCINE) 1000 UNIT/ML IJ SOLN
INTRAMUSCULAR | Status: DC | PRN
  Administered 2019-10-08: 5000 [IU] via INTRAVENOUS

## 2019-10-08 MED ORDER — METHYLPREDNISOLONE SODIUM SUCC 125 MG IJ SOLR: 125.0000 mg | Freq: Once | INTRAMUSCULAR | Status: DC | PRN

## 2019-10-08 MED ORDER — LABETALOL HCL 5 MG/ML IV SOLN: 10.0000 mg | INTRAVENOUS | Status: DC | PRN

## 2019-10-08 MED ORDER — HYDROMORPHONE HCL 1 MG/ML IJ SOLN: 1.0000 mg | Freq: Once | INTRAMUSCULAR | Status: DC | PRN

## 2019-10-08 MED ORDER — FENTANYL CITRATE (PF) 100 MCG/2ML IJ SOLN
INTRAMUSCULAR | Status: DC | PRN
Start: 2019-10-08 — End: 2019-10-08
  Administered 2019-10-08 (×2): 25 ug via INTRAVENOUS
  Administered 2019-10-08: 50 ug via INTRAVENOUS

## 2019-10-08 MED ORDER — DIPHENHYDRAMINE HCL 50 MG/ML IJ SOLN: 50.0000 mg | Freq: Once | INTRAMUSCULAR | Status: DC | PRN

## 2019-10-08 MED ORDER — ONDANSETRON HCL 4 MG/2ML IJ SOLN: 4.0000 mg | Freq: Four times a day (QID) | INTRAMUSCULAR | Status: DC | PRN

## 2019-10-08 MED ORDER — CLINDAMYCIN PHOSPHATE 300 MG/50ML IV SOLN
INTRAVENOUS | Status: AC
  Administered 2019-10-08: 300 mg via INTRAVENOUS
  Filled 2019-10-08: qty 50

## 2019-10-08 MED ORDER — HYDRALAZINE HCL 20 MG/ML IJ SOLN: 5.0000 mg | INTRAMUSCULAR | Status: DC | PRN

## 2019-10-08 MED ORDER — SODIUM CHLORIDE 0.9% FLUSH: 3.0000 mL | INTRAVENOUS | Status: DC | PRN

## 2019-10-08 MED ORDER — FAMOTIDINE 20 MG PO TABS: 40.0000 mg | ORAL_TABLET | Freq: Once | ORAL | Status: DC | PRN

## 2019-10-08 MED ORDER — IODIXANOL 320 MG/ML IV SOLN
INTRAVENOUS | Status: DC | PRN
  Administered 2019-10-08: 65 mL

## 2019-10-08 MED ORDER — MIDAZOLAM HCL 2 MG/2ML IJ SOLN
INTRAMUSCULAR | Status: DC | PRN
  Administered 2019-10-08 (×2): 1 mg via INTRAVENOUS

## 2019-10-08 MED ORDER — CLINDAMYCIN PHOSPHATE 300 MG/50ML IV SOLN: 300.0000 mg | Freq: Once | INTRAVENOUS | Status: AC

## 2019-10-08 MED ORDER — MIDAZOLAM HCL 2 MG/2ML IJ SOLN
INTRAMUSCULAR | Status: AC
  Filled 2019-10-08: qty 2

## 2019-10-08 MED ORDER — ACETAMINOPHEN 325 MG PO TABS: 650.0000 mg | ORAL_TABLET | ORAL | Status: DC | PRN

## 2019-10-08 MED ORDER — MIDAZOLAM HCL 2 MG/ML PO SYRP
8.0000 mg | ORAL_SOLUTION | Freq: Once | ORAL | Status: AC | PRN
  Administered 2019-10-08: 4 mg via ORAL

## 2019-10-08 SURGICAL SUPPLY — 22 items
BALLN DORADO 6X100X135 (BALLOONS) ×2
BALLN LUTONIX 018 5X300X130 (BALLOONS) ×2
BALLN ULTRVRSE 3X150X150 (BALLOONS) ×2
BALLOON DORADO 6X100X135 (BALLOONS) ×1 IMPLANT
BALLOON LUTONIX 018 5X300X130 (BALLOONS) ×1 IMPLANT
BALLOON ULTRVRSE 3X150X150 (BALLOONS) ×1 IMPLANT
CATH BEACON 5 .038 100 VERT TP (CATHETERS) ×2 IMPLANT
CATH PIG 70CM (CATHETERS) ×2 IMPLANT
CATH ROTAREX 135 6FR (CATHETERS) ×2 IMPLANT
COVER PROBE U/S 5X48 (MISCELLANEOUS) ×2 IMPLANT
DEVICE PRESTO INFLATION (MISCELLANEOUS) ×2 IMPLANT
DEVICE STARCLOSE SE CLOSURE (Vascular Products) ×2 IMPLANT
GLIDEWIRE ADV .035X260CM (WIRE) ×2 IMPLANT
PACK ANGIOGRAPHY (CUSTOM PROCEDURE TRAY) ×2 IMPLANT
SHEATH ANL2 6FRX45 HC (SHEATH) ×2 IMPLANT
SHEATH BRITE TIP 4FRX11 (SHEATH) ×2 IMPLANT
SHEATH BRITE TIP 5FRX11 (SHEATH) ×2 IMPLANT
STENT VIABAHN 6X100X120 (Permanent Stent) ×2 IMPLANT
SYR MEDRAD MARK 7 150ML (SYRINGE) ×2 IMPLANT
TUBING CONTRAST HIGH PRESS 72 (TUBING) ×2 IMPLANT
WIRE G V18X300CM (WIRE) ×2 IMPLANT
WIRE J 3MM .035X145CM (WIRE) ×2 IMPLANT

## 2019-10-08 NOTE — Interval H&P Note (Signed)
History and Physical Interval Note:  10/08/2019 2:28 PM  Peter [name]  has presented today for surgery, with the diagnosis of RT Lower Extremity Angiography   ASO with rest pain   PENUMBRA Rep   BARD Rep    cc: M Godley, S Willey    Pt to have Covid test on Aug 26.  The various methods of treatment have been discussed with the patient and family. After consideration of risks, benefits and other options for treatment, the patient has consented to  Procedure(s): LOWER EXTREMITY ANGIOGRAPHY (Right) as a surgical intervention.  The patient's history has been reviewed, patient examined, no change in status, stable for surgery.  I have reviewed the patient's chart and labs.  Questions were answered to the patient's satisfaction.     [name]

## 2019-10-08 NOTE — Op Note (Signed)
[facility]  Percutaneous Study/Intervention Procedural Note   Date of Surgery: 10/08/2019  Surgeon(s):Veda Arrellano    Assistants:none  Pre-operative Diagnosis: PAD with ulceration RLE  Post-operative diagnosis:  Same  Procedure(s) Performed:             1.  Ultrasound guidance for vascular access left femoral artery             2.  Catheter placement into right common femoral artery from left femoral approach             3.  Aortogram and selective right lower extremity angiogram             4.   Mechanical thrombectomy to the right SFA and popliteal arteries with the roto-Rex device             5.   Percutaneous transluminal angioplasty of the right peroneal artery 3 mm diameter angioplasty balloon  6.  Percutaneous transluminal angioplasty of the right SFA and popliteal arteries with 5 mm diameter angioplasty balloons  7.  Viabahn stent placement to the proximal SFA for residual stenosis and thrombus after angioplasty and thrombectomy using a 6 mm diameter by 10 cm length stent             8.  StarClose closure device left femoral artery  EBL: 100 cc  Contrast: 65 cc  Fluoro Time: 8.2 minutes  Moderate Conscious Sedation Time: approximately 45 minutes using 2 mg of Versed and 100 mcg of Fentanyl              Indications:  Patient is a 86 y.o.male with recurrent ulceration of the right foot and a known history of peripheral arterial disease. The patient has noninvasive study showing reduced flow in the right leg. The patient is brought in for angiography for further evaluation and potential treatment.  Due to the limb threatening nature of the situation, angiogram was performed for attempted limb salvage. The patient is aware that if the procedure fails, amputation would be expected.  The patient also understands that even with successful revascularization, amputation may still be required due to the severity of the situation.  Risks and benefits are discussed  and informed consent is obtained.   Procedure:  The patient was identified and appropriate procedural time out was performed.  The patient was then placed supine on the table and prepped and draped in the usual sterile fashion. Moderate conscious sedation was administered during a face to face encounter with the patient throughout the procedure with my supervision of the RN administering medicines and monitoring the patient's vital signs, pulse oximetry, telemetry and mental status throughout from the start of the procedure until the patient was taken to the recovery room. Ultrasound was used to evaluate the left common femoral artery.  It was patent .  A digital ultrasound image was acquired.  A Seldinger needle was used to access the left common femoral artery under direct ultrasound guidance and a permanent image was performed.  A 0.035 J wire was advanced without resistance and a 5Fr sheath was placed.  Pigtail catheter was placed into the aorta and an AP aortogram was performed. This demonstrated normal renal arteries and normal aorta and iliac segments without significant stenosis. I then crossed the aortic bifurcation and advanced to the right femoral head. Selective right lower extremity angiogram was then performed. This demonstrated that the right common femoral artery and profunda femoris artery were patent.  The SFA had a short  stub of patency proximally where stent was placed.  It occluded below the stent and before the next set of stents and remained occluded down through the entirety of the SFA and popliteal arteries.  The proximal segment of the peroneal artery was also occluded and there was reconstitution of the mid segment of the peroneal artery is the only runoff distally.. It was felt that it was in the patient's best interest to proceed with intervention after these images to avoid a second procedure and a larger amount of contrast and fluoroscopy based off of the findings from the initial  angiogram. The patient was systemically heparinized and a 6 [place] Ansell sheath was then placed over the [employer] wire. I then used a Kumpe catheter and the advantage wire to navigate through the occlusion with minimal difficulty and confirm intraluminal flow in the mid peroneal artery.  I then replaced a V 18 wire and remove the Kumpe catheter.  I started by performing mechanical thrombectomy with 2 passes with the roto-Rex device throughout the right SFA and popliteal artery stopping at the bottom of the previously placed stents which was in the distal popliteal artery and possibly the most proximal tibioperoneal trunk.  Following this, there was some flow channel but he remained highly stenotic in many segments and the peroneal artery remained occluded.  I then performed angioplasty.  3 mm diameter by 15 cm length angioplasty balloon was inflated in the proximal peroneal artery and tibioperoneal trunk up to 14 atm for 1 minute.  The popliteal artery and SFA were treated with 2 inflations with a 5 mm diameter by 30 cm length Lutonix drug-coated angioplasty balloon inflated to 12 atm for 1 minute.  The area between stents at the top of the second set of stents in the proximal SFA had residual thrombus and high-grade stenosis.  Other than that, there was marked improvement with less than 30% residual stenosis throughout the SFA and popliteal arteries.  The peroneal artery did have some mild stenosis in the 30 to 40% range, but there is some degree of spasm as well in this vessel.  I elected to bridge the stents and cover the area of residual stenosis with a Viabahn covered stent.  A 6 mm diameter by 10 cm length Viabahn stent was deployed and postdilated with a 6 mm diameter high-pressure angioplasty balloon with less than 10% residual stenosis and brisk flow. I elected to terminate the procedure. The sheath was removed and StarClose closure device was deployed in the left femoral artery with excellent  hemostatic result. The patient was taken to the recovery room in stable condition having tolerated the procedure well.  Findings:               Aortogram:  This demonstrated normal renal arteries and normal aorta and iliac segments without significant stenosis             Right lower Extremity:  The right common femoral artery and profunda femoris artery were patent.  The SFA had a short stub of patency proximally where stent was placed.  It occluded below the stent and before the next set of stents and remained occluded down through the entirety of the SFA and popliteal arteries.  The proximal segment of the peroneal artery was also occluded and there was reconstitution of the mid segment of the peroneal artery is the only runoff distally.   Disposition: Patient was taken to the recovery room in stable condition having tolerated the procedure well.  Complications: None  Peter [name] 10/08/2019 4:02 PM   This note was created with Dragon Medical transcription system. Any errors in dictation are purely unintentional.

## 2019-10-11 ENCOUNTER — Other Ambulatory Visit: Payer: Medicare Other

## 2019-10-11 ENCOUNTER — Encounter: Payer: Self-pay | Admitting: Vascular Surgery

## 2019-10-11 NOTE — Telephone Encounter (Signed)
Spoke with the patient's daughter and he has been scheduled to come in 9/2 at 3:20 pm

## 2019-10-12 ENCOUNTER — Other Ambulatory Visit: Payer: Self-pay

## 2019-10-12 ENCOUNTER — Ambulatory Visit: Payer: Medicare Other

## 2019-10-12 ENCOUNTER — Ambulatory Visit (INDEPENDENT_AMBULATORY_CARE_PROVIDER_SITE_OTHER): Payer: Medicare Other | Admitting: Gastroenterology

## 2019-10-12 ENCOUNTER — Ambulatory Visit (INDEPENDENT_AMBULATORY_CARE_PROVIDER_SITE_OTHER): Payer: Medicare Other | Admitting: Podiatry

## 2019-10-12 VITALS — BP 152/52 | HR 57 | Temp 98.6°F | Ht 67.0 in | Wt 164.0 lb

## 2019-10-12 DIAGNOSIS — I200 Unstable angina: Secondary | ICD-10-CM

## 2019-10-12 DIAGNOSIS — I739 Peripheral vascular disease, unspecified: Secondary | ICD-10-CM

## 2019-10-12 DIAGNOSIS — R159 Full incontinence of feces: Secondary | ICD-10-CM | POA: Diagnosis not present

## 2019-10-12 DIAGNOSIS — E1142 Type 2 diabetes mellitus with diabetic polyneuropathy: Secondary | ICD-10-CM | POA: Diagnosis not present

## 2019-10-12 DIAGNOSIS — L97512 Non-pressure chronic ulcer of other part of right foot with fat layer exposed: Secondary | ICD-10-CM

## 2019-10-12 DIAGNOSIS — I96 Gangrene, not elsewhere classified: Secondary | ICD-10-CM | POA: Diagnosis not present

## 2019-10-12 DIAGNOSIS — D649 Anemia, unspecified: Secondary | ICD-10-CM | POA: Diagnosis not present

## 2019-10-12 DIAGNOSIS — L89610 Pressure ulcer of right heel, unstageable: Secondary | ICD-10-CM
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
## 2019-10-12 NOTE — Progress Notes (Signed)
  Subjective:  Patient ID: Peter [name], male    DOB: 1930-08-18,  MRN: [id number]  Chief Complaint  Patient presents with  . Diabetic Ulcer    Pt 2nd toe- dark discoloration- internal bleeding pt states- further evluation     86 y.o. male presents with the above complaint. History confirmed with patient.  Had a vascular procedure last week on the 27th.  Here today for follow-up and evaluation of the toe and heel.  Objective:  Physical Exam: His right foot pedal pulses are currently palpable to me, however his foot feels warm and well perfused compared to the left side.  He has brisk capillary refill now.  The second digit has coalesced into dry gangrene and is still demarcating.  He has a stable hardening blood blister on the posterior inferior heel.  No sign of infection or other site.      Assessment:   1. Dry gangrene ([place])   2. Diabetic polyneuropathy associated with type 2 diabetes mellitus ([place])   3. PVD (peripheral vascular disease) ([place])   4. 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 Pressure injury of right heel, unstageable ([place])      Plan:  Patient was evaluated and treated and all questions answered.   - Discussed with patient and his daughter that he does appear to have significant improvement in blood flow to the right lower extremity.  He has some redness, edema and pain without signs of wound infection.  I suspect that this is secondary to reperfusion injury and should improve over the next few days to weeks.  -  I recommend that we allow the second digit to dry gangrene to continue to demarcate, the distal tip looks like it will not recover but the dorsal portion may recover.  We will then plan for partial or full amputation of the digit thereafter. -For the unstageable heel ulcer, I recommended offloading, a Multi-Podus style boot was dispensed today.  Unclear if this will develop into a foot ulcer currently.  I reiterated the  importance of offloading the heel at all times. -They will follow-up in 2 weeks with Dr. Milinda Pointer.  Return in about 2 weeks (around 10/26/2019) for wound re-check with Dr Milinda Pointer .

## 2019-10-12 NOTE — Patient Instructions (Signed)
Ensure the heel is free from pressure with the brace or a pillow under the calf at all times  Apply betadine to the toe daily to keep clean and dry

## 2019-10-13 ENCOUNTER — Other Ambulatory Visit: Payer: Self-pay

## 2019-10-13 DIAGNOSIS — R159 Full incontinence of feces: Secondary | ICD-10-CM

## 2019-10-13 LAB — IRON,TIBC AND FERRITIN PANEL
Ferritin: 230 ng/mL (ref 30–400)
Iron Saturation: 5 % — CL (ref 15–55)
Iron: 10 ug/dL — ABNORMAL LOW (ref 38–169)
Total Iron Binding Capacity: 189 ug/dL — ABNORMAL LOW (ref 250–450)
UIBC: 179 ug/dL (ref 111–343)

## 2019-10-14 ENCOUNTER — Ambulatory Visit (INDEPENDENT_AMBULATORY_CARE_PROVIDER_SITE_OTHER): Payer: Medicare Other | Admitting: Family Medicine

## 2019-10-14 ENCOUNTER — Ambulatory Visit: Payer: Medicare Other

## 2019-10-14 ENCOUNTER — Other Ambulatory Visit: Payer: Self-pay

## 2019-10-14 ENCOUNTER — Encounter: Payer: Self-pay | Admitting: Family Medicine

## 2019-10-14 VITALS — BP 130/60 | HR 55 | Temp 98.1°F | Wt 157.0 lb

## 2019-10-14 DIAGNOSIS — M545 Low back pain, unspecified: Secondary | ICD-10-CM

## 2019-10-14 DIAGNOSIS — G8929 Other chronic pain: Secondary | ICD-10-CM

## 2019-10-14 DIAGNOSIS — Z7189 Other specified counseling: Secondary | ICD-10-CM

## 2019-10-14 DIAGNOSIS — G63 Polyneuropathy in diseases classified elsewhere: Secondary | ICD-10-CM | POA: Diagnosis not present

## 2019-10-14 DIAGNOSIS — Z794 Long term (current) use of insulin: Secondary | ICD-10-CM | POA: Diagnosis not present

## 2019-10-14 DIAGNOSIS — R159 Full incontinence of feces: Secondary | ICD-10-CM | POA: Diagnosis not present

## 2019-10-14 DIAGNOSIS — D509 Iron deficiency anemia, unspecified: Secondary | ICD-10-CM | POA: Insufficient documentation

## 2019-10-14 DIAGNOSIS — F321 Major depressive disorder, single episode, moderate: Secondary | ICD-10-CM

## 2019-10-14 DIAGNOSIS — G894 Chronic pain syndrome: Secondary | ICD-10-CM | POA: Diagnosis not present

## 2019-10-14 DIAGNOSIS — N183 Chronic kidney disease, stage 3 unspecified: Secondary | ICD-10-CM

## 2019-10-14 DIAGNOSIS — I10 Essential (primary) hypertension: Secondary | ICD-10-CM | POA: Diagnosis not present

## 2019-10-14 DIAGNOSIS — E1122 Type 2 diabetes mellitus with diabetic chronic kidney disease: Secondary | ICD-10-CM

## 2019-10-14 LAB — POCT GLYCOSYLATED HEMOGLOBIN (HGB A1C): Hemoglobin A1C: 6 % — AB (ref 4.0–5.6)

## 2019-10-14 MED ORDER — SERTRALINE HCL 50 MG PO TABS: 50.0000 mg | ORAL_TABLET | Freq: Every day | ORAL | 3 refills | Status: DC

## 2019-10-14 NOTE — Assessment & Plan Note (Signed)
Seeing GI recent iron studies with possibly chronic disease as well as iron deficiency. He is on dual anti-platelet therapy but also had a stent placed in May. Discussed that if a GI bleed there will likely be a more in-depth conversation of risk/benefits regarding anti-platelet heart protection vs bleeding risk. Appreciate GI support - plan to follow-up regarding IV iron infusion.

## 2019-10-14 NOTE — Progress Notes (Signed)
Subjective:     Peter [name] is a 86 y.o. male presenting for low iron (interested in an "injection"), discuss symptom management, and mood     HPI  #Chronic pain - feeling frustrated about pain - in back primarily - pain specialist - tried a number of procedures and epidurals which did not work and injections w/o improvement - got tramadol at a walk-in clinic for toe wound - he felt that this did not help with his back pain - feels like only morphine will help  #Low iron  - saw GI  Review of Systems  10/12/2019: GI - Dr. Vicente Males - Stool incontinence - explosive diarrhea, anemia, recovered from C.diff. symptoms x 6 months - colonoscopy to evaluate for neoplasm 10/12/2019: podiatry - [facility] - diabetic ulcer s/p vascular procedures - improved blood flow - off-loading heel ulcer  Social History   Tobacco Use  Smoking Status Former Smoker  . Packs/day: 0.75  . Years: 12.00  . Pack years: 9.00  . Types: Cigars, Cigarettes  . Quit date: 27  . Years since quitting: 51.7  Smokeless Tobacco Former Systems developer  . Types: Chew  . Quit date: 2016        Objective:    BP Readings from Last 3 Encounters:  10/14/19 130/60  10/12/19 (!) 152/52  10/08/19 (!) 182/60   Wt Readings from Last 3 Encounters:  10/14/19 157 lb (71.2 kg)  10/12/19 164 lb (74.4 kg)  10/08/19 170 lb (77.1 kg)    BP 130/60   Pulse (!) 55   Temp 98.1 F (36.7 C) (Temporal)   Wt 157 lb (71.2 kg)   SpO2 97%   BMI 24.59 kg/m    Physical Exam Constitutional:      Appearance: Normal appearance. He is not ill-appearing or diaphoretic.  HENT:     Right Ear: External ear normal.     Left Ear: External ear normal.  Eyes:     General: No scleral icterus.    Extraocular Movements: Extraocular movements intact.     Conjunctiva/sclera: Conjunctivae normal.  Cardiovascular:     Rate and Rhythm: Normal rate and regular rhythm.     Heart sounds: No murmur heard.   Pulmonary:     Effort: Pulmonary  effort is normal. No respiratory distress.     Breath sounds: Normal breath sounds. No wheezing.  Musculoskeletal:     Cervical back: Neck supple.  Skin:    General: Skin is warm and dry.  Neurological:     Mental Status: He is alert. Mental status is at baseline.  Psychiatric:        Mood and Affect: Mood normal.           Assessment & Plan:   Problem List Items Addressed This Visit      Cardiovascular and Mediastinum   Hypertension    BP controled on current medications. Discussed continuing these medications        Endocrine   DM (diabetes mellitus) type II controlled with renal manifestation (HCC) - Primary    HgbA1c 6.0. Not eating the same as he used to. Discussed doing diet along and monitoring at home. If CBG >180 will call the office. Could consider oral agent vs restarting insulin at lower dose. Complicated by vascular, renal, neuropathy.       Relevant Orders   POCT glycosylated hemoglobin (Hb A1C) (Completed)     Nervous and Auditory   Polyneuropathy associated with underlying disease ([place])    Pt unable  to do TID dosing of gabapentin and notes minimal improvement. Trial of 900 mg BID to see if can get improved symptom control       Relevant Medications   sertraline (ZOLOFT) 50 MG tablet     Other   Chronic bilateral low back pain    Has been seeing pain medication with injections and all possible treatments without improvement. Is seeing GI for stool incontinence and they will be assessing tone. Notes the only thing that has worked was morphine (IV) when he was hospitalized. Tried tramadol w/o improvement. Discussed risk of opiates and that for long-term chronic pain these not great treatment, however, palliative care consult and as we shift our focus of care towards pain control and symptom management then this may be an option. Briefly discussed concern for sedation and fall (pt already with fall hx). PT referral to help with balance, strengthening, falls.  Palliative to help with some pain management options and ongoing care.       Relevant Orders   Ambulatory referral to [facility]   Amb Referral to Palliative Care   Chronic pain syndrome   Relevant Medications   sertraline (ZOLOFT) 50 MG tablet   DNR (do not resuscitate) discussion    Discussed goals of care - at this point would want everything done to keep him alive, but would not want CPR if his heart stopped and wasn't breathing. DNR form signed, copy to scan and original to family      Current moderate episode of major depressive disorder without prior episode ([place])    Notes more irritability and depressed mood for several months. May be related to dementia, but will try increasing SSRI to see if symptoms improve. Sertraline 25>50 mg and then to 100 mg if tolerating. Return in 3 weeks for check-in      Relevant Medications   sertraline (ZOLOFT) 50 MG tablet   Iron deficiency anemia    Seeing GI recent iron studies with possibly chronic disease as well as iron deficiency. He is on dual anti-platelet therapy but also had a stent placed in May. Discussed that if a GI bleed there will likely be a more in-depth conversation of risk/benefits regarding anti-platelet heart protection vs bleeding risk. Appreciate GI support - plan to follow-up regarding IV iron infusion.           Return in about 3 weeks (around 11/04/2019).  Lesleigh Noe, MD  This visit occurred during the SARS-CoV-2 public health emergency.  Safety protocols were in place, including screening questions prior to the visit, additional usage of staff PPE, and extensive cleaning of exam room while observing appropriate contact time as indicated for disinfecting solutions.

## 2019-10-14 NOTE — Assessment & Plan Note (Signed)
Discussed goals of care - at this point would want everything done to keep him alive, but would not want CPR if his heart stopped and wasn't breathing. DNR form signed, copy to scan and original to family

## 2019-10-14 NOTE — Assessment & Plan Note (Signed)
BP controled on current medications. Discussed continuing these medications

## 2019-10-14 NOTE — Assessment & Plan Note (Signed)
Notes more irritability and depressed mood for several months. May be related to dementia, but will try increasing SSRI to see if symptoms improve. Sertraline 25>50 mg and then to 100 mg if tolerating. Return in 3 weeks for check-in

## 2019-10-14 NOTE — Assessment & Plan Note (Signed)
Pt unable to do TID dosing of gabapentin and notes minimal improvement. Trial of 900 mg BID to see if can get improved symptom control

## 2019-10-14 NOTE — Assessment & Plan Note (Signed)
Has been seeing pain medication with injections and all possible treatments without improvement. Is seeing GI for stool incontinence and they will be assessing tone. Notes the only thing that has worked was morphine (IV) when he was hospitalized. Tried tramadol w/o improvement. Discussed risk of opiates and that for long-term chronic pain these not great treatment, however, palliative care consult and as we shift our focus of care towards pain control and symptom management then this may be an option. Briefly discussed concern for sedation and fall (pt already with fall hx). PT referral to help with balance, strengthening, falls. Palliative to help with some pain management options and ongoing care.

## 2019-10-14 NOTE — Assessment & Plan Note (Signed)
HgbA1c 6.0. Not eating the same as he used to. Discussed doing diet along and monitoring at home. If CBG >180 will call the office. Could consider oral agent vs restarting insulin at lower dose. Complicated by vascular, renal, neuropathy.

## 2019-10-14 NOTE — Patient Instructions (Addendum)
#  Referral I have placed a referral to a specialist for you. You should receive a phone call from the specialty office. Make sure your voicemail is not full and that if you are able to answer your phone to unknown or new numbers.   It may take up to 2 weeks to hear about the referral. If you do not hear anything in 2 weeks, please call our office and ask to speak with the referral coordinator.   -- Palliative Care -- Home Health Physical therapy  Pain - Increase Gabapentin to 900 mg (3 capsules) twice daily -- start by taking 900 mg at night for 3 days, then take 900 mg twice daily - Check in with palliative care or myself if we decide that opiates are the best option  Mood - start taking 50 mg sertraline (zoloft)  - After 1-2 weeks can increase to 100 mg daily  I will contact Dr. Vicente Males to clarify the iron infusions and someone from one of our offices will reach out to you

## 2019-10-15 ENCOUNTER — Other Ambulatory Visit: Payer: Self-pay | Admitting: *Deleted

## 2019-10-15 ENCOUNTER — Encounter: Payer: Self-pay | Admitting: Gastroenterology

## 2019-10-15 ENCOUNTER — Other Ambulatory Visit: Payer: Medicare Other

## 2019-10-15 ENCOUNTER — Telehealth: Payer: Self-pay

## 2019-10-15 ENCOUNTER — Telehealth: Payer: Self-pay | Admitting: Family Medicine

## 2019-10-15 ENCOUNTER — Encounter: Payer: Self-pay | Admitting: Family Medicine

## 2019-10-15 ENCOUNTER — Telehealth: Payer: Self-pay | Admitting: Physician Assistant

## 2019-10-15 MED ORDER — ISOSORBIDE MONONITRATE ER 60 MG PO TB24
60.0000 mg | ORAL_TABLET | Freq: Every day | ORAL | 1 refills | Status: DC
Start: 2019-10-15 — End: 2020-02-04

## 2019-10-15 NOTE — Telephone Encounter (Signed)
   [facility] Pre-operative Risk Assessment     Request for surgical clearance:  1. What type of surgery is being performed?  Colonoscopy  2. When is this surgery scheduled?  10/28/2019  3. What type of clearance is required (medical clearance vs. Pharmacy clearance to hold med vs. Both)?  Pharmacy  4. Are there any medications that need to be held prior to surgery and how long?  Plavix  5. Practice name and name of physician performing surgery?  Dr. Vicente Males; [facility]   6. What is the office phone number? [phone]    7.   What is the office fax number? [phone]   8.   Anesthesia type (None, local, MAC, general) ? unknown   [name] Dopp 10/15/2019, 8:25 AM  _________________________________________________________________   (provider comments below)

## 2019-10-15 NOTE — Telephone Encounter (Signed)
  Preop APP Clinic  86 yo male with hx of coronary artery disease s/p non-STEMI in 03/2018 tx with DES to RCA and recent DES to the LAD in 06/2019 in the setting of unstable angina, chronic kidney disease, diabetes mellitus, hypertension, hyperlipidemia, prostate CA.  He needs a colonoscopy and recommendation regarding Clopidogrel has been requested.    Recent OV 09/15/19.  Chart review indicates he was overall stable.   Patient will need uninterrupted dual antiplatelet Rx given recent hx of PCI with DES to LAD.  Recommendation for his colonoscopy should be to continue Clopidogrel without holding.  I will confirm with Dr. Saunders Revel.  Dr. Saunders Revel >> Please comment on recommendations for Clopidogrel.  Requesting surgeon is asking if Clopidogrel can be held or if it should be continued.  Please reply to P CV DIV PREOP.  [name] Dopp, PA-C    10/15/2019 8:38 AM

## 2019-10-15 NOTE — Telephone Encounter (Signed)
fmla paperwork on cart to be delivered to dr cody   fmla for daughter to help pt with transportation and ADL's

## 2019-10-15 NOTE — Telephone Encounter (Signed)
Palliative care SW LVM for patients daughter, [name], to schedule initial in hoe visit. Awaiting return call.

## 2019-10-17 ENCOUNTER — Other Ambulatory Visit: Payer: Self-pay | Admitting: Family Medicine

## 2019-10-18 ENCOUNTER — Other Ambulatory Visit (INDEPENDENT_AMBULATORY_CARE_PROVIDER_SITE_OTHER): Payer: Self-pay | Admitting: Nurse Practitioner

## 2019-10-19 ENCOUNTER — Ambulatory Visit: Payer: Medicare Other

## 2019-10-19 LAB — GI PROFILE, STOOL, PCR

## 2019-10-19 LAB — C DIFFICILE TOXINS A+B W/RFLX: C difficile Toxins A+B, EIA: NEGATIVE

## 2019-10-19 LAB — C DIFFICILE, CYTOTOXIN B

## 2019-10-19 NOTE — Telephone Encounter (Signed)
Signed and placed in box for pick up

## 2019-10-20 ENCOUNTER — Other Ambulatory Visit: Payer: Self-pay

## 2019-10-20 ENCOUNTER — Telehealth: Payer: Self-pay

## 2019-10-20 ENCOUNTER — Ambulatory Visit: Payer: Medicare Other | Admitting: Podiatry

## 2019-10-20 DIAGNOSIS — D649 Anemia, unspecified: Secondary | ICD-10-CM

## 2019-10-20 MED ORDER — ATORVASTATIN CALCIUM 80 MG PO TABS
80.0000 mg | ORAL_TABLET | Freq: Every evening | ORAL | 0 refills | Status: DC
Start: 2019-10-20 — End: 2019-11-17

## 2019-10-20 NOTE — Telephone Encounter (Signed)
Gave to emily to email loreen aware  Copy for pt coyp for scan

## 2019-10-20 NOTE — Telephone Encounter (Signed)
-----   Message from [name], MD sent at 10/15/2019  9:50 AM EDT ----- Please refer to Dr Tasia Catchings to evaluate anemia - ? Iron deficiency , can we add EGD to colonoscopy since he has possibly some iron deficiency anemia

## 2019-10-20 NOTE — Telephone Encounter (Signed)
Called and talk to patient wife today

## 2019-10-20 NOTE — Telephone Encounter (Signed)
Added EGD to colonoscopy on 10/28/2019. Talk to Wannetta Sender and she states she will add this. Informed patient wife of this information and they verbalized understanding

## 2019-10-21 ENCOUNTER — Ambulatory Visit: Payer: Medicare Other

## 2019-10-21 NOTE — Telephone Encounter (Signed)
Palliative care SW scheduled initial home visit with patient daughter, [name], for Fri 10-22-2019 @1230pm .

## 2019-10-22 ENCOUNTER — Other Ambulatory Visit: Payer: Self-pay

## 2019-10-22 ENCOUNTER — Telehealth: Payer: Self-pay

## 2019-10-22 ENCOUNTER — Other Ambulatory Visit: Payer: Medicare Other

## 2019-10-22 DIAGNOSIS — Z515 Encounter for palliative care: Secondary | ICD-10-CM

## 2019-10-22 NOTE — Telephone Encounter (Signed)
[name] called and requested home health orders be sent to [facility]. Their phone # is [phone]. Daughter would like c/b once this has been completed. [phone] Thank you!

## 2019-10-22 NOTE — Progress Notes (Signed)
COMMUNITY PALLIATIVE CARE SW NOTE  PATIENT NAME: Peter [name] DOB: 06-14-1930 MRN: [id number]  PRIMARY CARE PROVIDER: Lesleigh Noe, [name]  RESPONSIBLE PARTY:  Acct ID - Guarantor Home Phone Work Phone Relationship Acct Type  [id number] - [name],LAW* [phone]  Self P/F     [name], Peter, [name] [address]     PLAN OF CARE and INTERVENTIONS:             1. GOALS OF CARE/ ADVANCE CARE PLANNING:  Patient is a DNR. Patient has Declo. Patient's goal is pain management. 2. SOCIAL/EMOTIONAL/SPIRITUAL ASSESSMENT/ INTERVENTIONS:  SW and RN met with patient, patient's daughter, Peter [name], and wife. Patient lives in a two story home with wife. Patient and daughter provided overview and update on patients care and possible needs. 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 Patient has chronic pain in his leg and lower back. Patient has been followed by pain management clinic and has received injection in back, with no relief. Patient currently taking gabapentin, which is not helpful per patient. Patient had tried tramadol as well and was not helpful. Patient shared the only thing that had helped his pain in the past was morphine but he is afraid to take long term due to being afraid of being addicted to it. Patient has mild dementia, per daughter. Patient shared that he has had some incontinence of bowel and has to constantly change himself. Patient scheduled to have colonoscopy 9/16 and iron infusion with hematologist on 9/13. Patient appetite is good. Patient shared that his main issue is seeing a lot of different providers that has not been able to provide him any relief. Patient has had nearly 5-6 falls in the month.N to consult with NP about pain management. SW provided education on palliative care, discussed goals,  reviewed care plan, provided emotional support, used active and reflective listening. Palliative care will continue to monitor and assist with long term care planning as needed.   3. PATIENT/CAREGIVER EDUCATION/ COPING:  Patient alert and oriented and engaged in conversation. Daughter shared that patient has been more irritable and depressed since pain and is more inpatient with wife who has Huntington's disease. Patient has 2 daughters that are supportive. 4. PERSONAL EMERGENCY PLAN:  Family will call 9-1-1 for emergencies.  5. COMMUNITY RESOURCES COORDINATION/ HEALTH CARE NAVIGATION:  [name] and wife manages care. Patient has private duty caregivers though [name]. 6. FINANCIAL/LEGAL CONCERNS/INTERVENTIONS:  None.      SOCIAL HX:  Social History   Tobacco Use  . Smoking status: Former Smoker    Packs/day: 0.75    Years: 12.00    Pack years: 9.00    Types: Cigars, Cigarettes    Quit date: 1970    Years since quitting: 51.7  . Smokeless tobacco: Former Systems developer    Types: [place] date: 2016  Substance Use Topics  . Alcohol use: Yes    Comment: occasional    CODE STATUS: DNR ADVANCED DIRECTIVES: Y MOST FORM COMPLETE: N HOSPICE EDUCATION PROVIDED: N  PPS: Patient is able to ambulate with rollator and has WC. Patient is able to take self to rest room.     Time spent: 45 min    [place], [place]

## 2019-10-22 NOTE — Telephone Encounter (Signed)
From a cardiac standpoint, I would prefer that Mr. Kincade remain on dual antiplatelet therapy for at least 6 months from the time of his PCI to the proximal LAD performed in 06/2019, ideally longer.  More acutely, he underwent right lower extremity intervention by vascular surgery with placement of a Viabahn covered stent about 2 weeks ago.  I would be very reluctant to discontinue aspirin and clopidogrel at this time given high risk for acute thrombosis of this vascular stent and associated acute limb ischemia.  If colonoscopy can be performed on aspirin and clopidogrel, I think it would be reasonable to proceed from a heart standpoint.  I would not discontinue any antiplatelet therapy at this time without clearance from vascular surgery.  [name], MD [facility]

## 2019-10-22 NOTE — Telephone Encounter (Signed)
   [facility] Pre-operative Risk Assessment    Request for surgical clearance:  1. What type of surgery is being performed? Colonoscopy   2. When is this surgery scheduled? 10/28/19   3. Are there any medications that need to be held prior to surgery and how long? Plavix 54m   4. Practice name and name of physician performing surgery? [facility]. Dr. AVicente Males  5. What is your office phone and fax number? [phone] [phone]  6. Anesthesia type (None, local, MAC, general) ? General   Peter [name] E Peter [name] 10/22/2019, 9:13 AM  _________________________________________________________________   (provider comments below)   .card

## 2019-10-24 ENCOUNTER — Other Ambulatory Visit: Payer: Self-pay | Admitting: Family Medicine

## 2019-10-24 ENCOUNTER — Ambulatory Visit
Admission: RE | Admit: 2019-10-24 | Discharge: 2019-10-24 | Disposition: A | Discharge status: Home / Self Care | Payer: Medicare Other | Source: Ambulatory Visit | Attending: Neurology | Admitting: Neurology

## 2019-10-24 ENCOUNTER — Other Ambulatory Visit: Payer: Self-pay

## 2019-10-24 DIAGNOSIS — G319 Degenerative disease of nervous system, unspecified: Secondary | ICD-10-CM | POA: Diagnosis not present

## 2019-10-24 DIAGNOSIS — J3489 Other specified disorders of nose and nasal sinuses: Secondary | ICD-10-CM | POA: Diagnosis not present

## 2019-10-24 DIAGNOSIS — I639 Cerebral infarction, unspecified: Secondary | ICD-10-CM | POA: Insufficient documentation

## 2019-10-24 DIAGNOSIS — I6782 Cerebral ischemia: Secondary | ICD-10-CM | POA: Diagnosis not present

## 2019-10-24 DIAGNOSIS — H748X3 Other specified disorders of middle ear and mastoid, bilateral: Secondary | ICD-10-CM | POA: Diagnosis not present

## 2019-10-24 IMAGING — MR MR HEAD W/O CM
10 series · 38 of 48 positions shown · non-contrast
Comparison: No pertinent prior exams are available for comparison.

CLINICAL DATA: Cerebrovascular accident, unspecified mechanism.
Additional history provided by scanning technologist: Patient
reports right arm pain with hand weakness.

EXAM:
MRI HEAD WITHOUT CONTRAST
TECHNIQUE: Multiplanar, multiecho pulse sequences of the brain and surrounding
structures were obtained without intravenous contrast.

[Series 5: ax dwi_tracew · axial · 3.0mm · 0.60mm/px · z∈[-103,+52]mm · 4 of 48 slices shown]
[im 1/48]
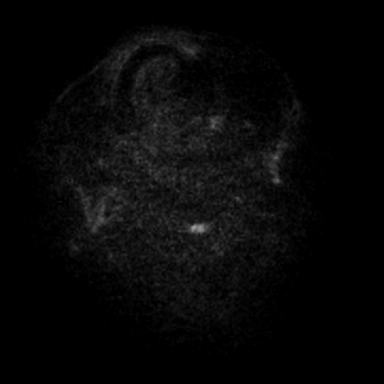
[im 16/48]
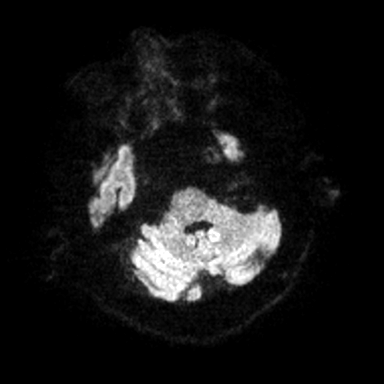
[im 32/48]
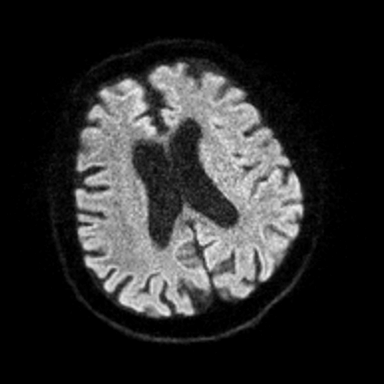
[im 48/48]
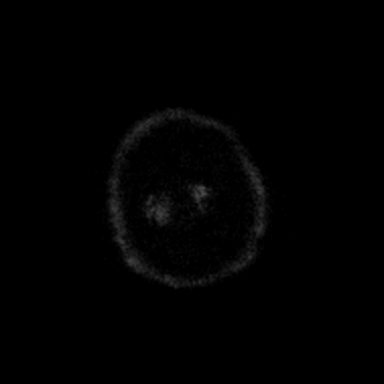

[Series 6: ax dwi_adc · axial · 3.0mm · 0.60mm/px · z∈[-103,+52]mm · 4 of 48 slices shown]
[im 1/48]
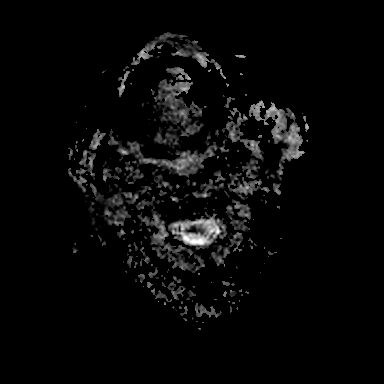
[im 16/48]
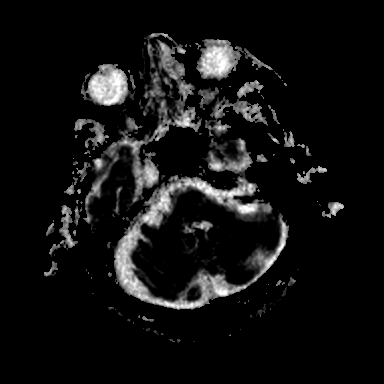
[im 32/48]
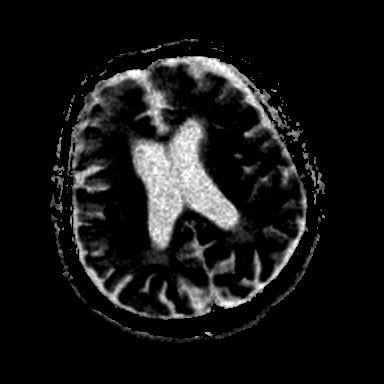
[im 48/48]
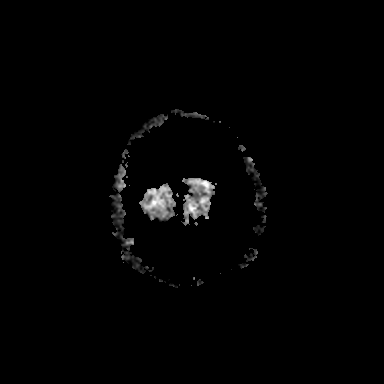

[Series 7: cor dwi_tracew · coronal · 5.0mm · 0.60mm/px · 4 of 38 slices shown]
[im 1/38]
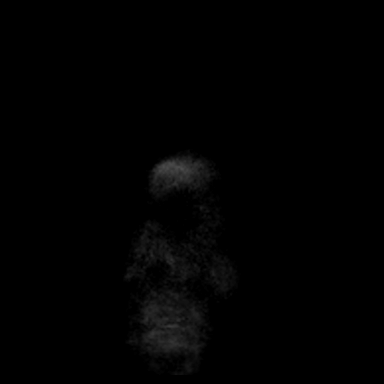
[im 13/38]
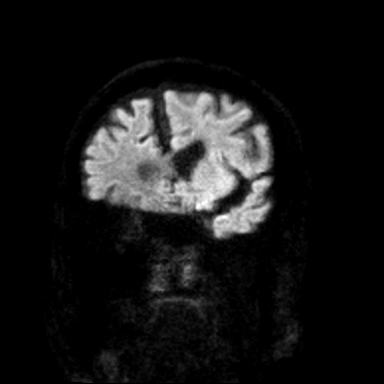
[im 25/38]
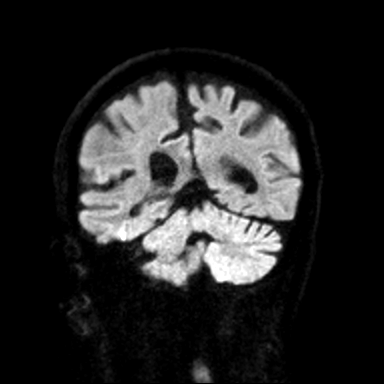
[im 38/38]
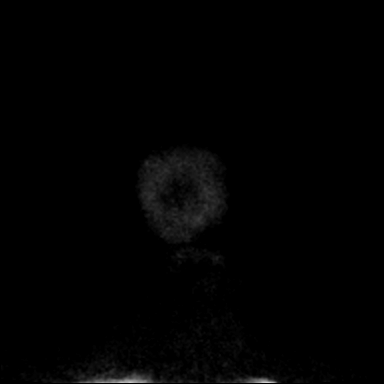

[Series 8: cor dwi_adc · coronal · 5.0mm · 0.60mm/px · 4 of 38 slices shown]
[im 1/38]
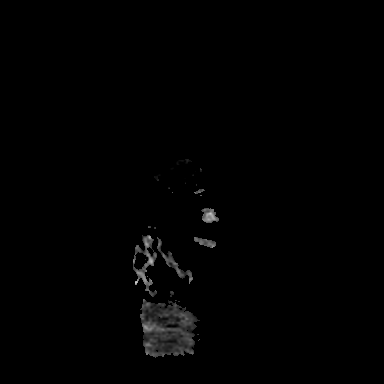
[im 13/38]
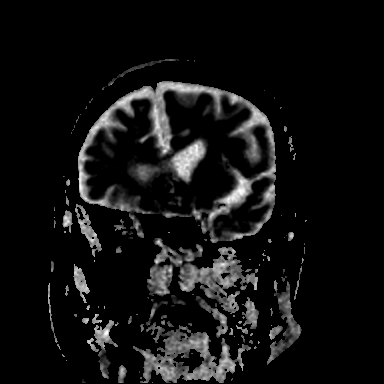
[im 25/38]
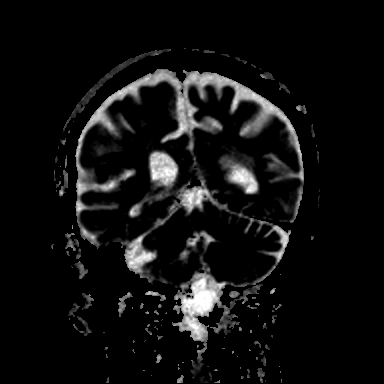
[im 38/38]
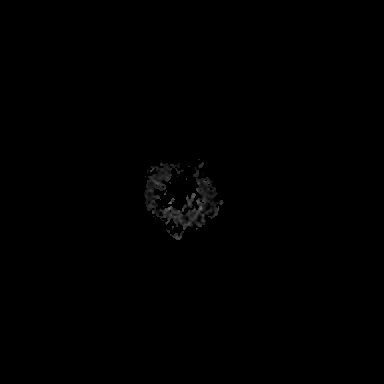

[Series 9: T1 · sagittal · 5.0mm · 0.62mm/px · 2 of 23 slices shown (1 of 2)]
[im 1/23]
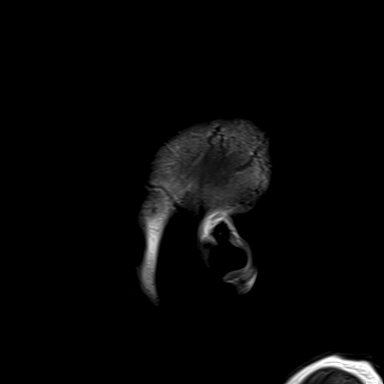
[im 23/23]
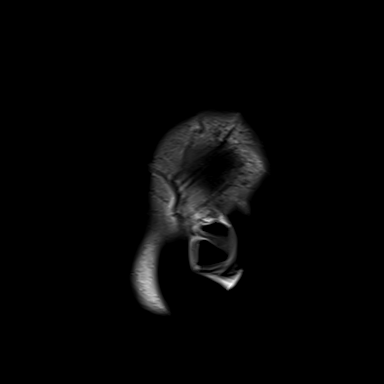

[Series 17: T2 · coronal · 5.0mm · 0.45mm/px · 3 of 31 slices shown (1 of 2)]
[im 1/31]
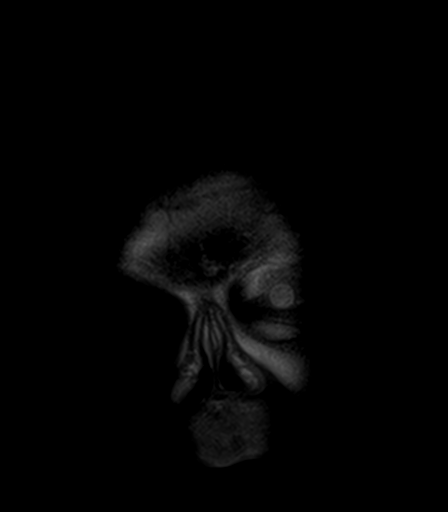
[im 16/31]
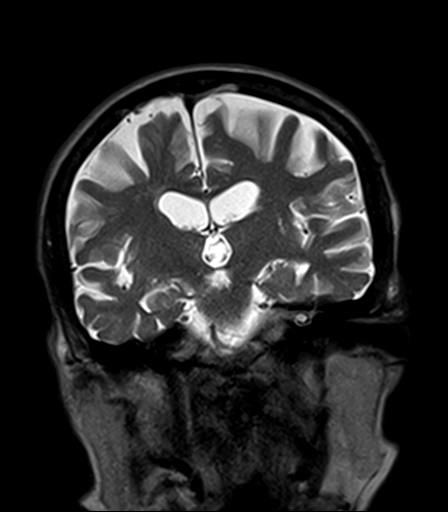
[im 31/31]
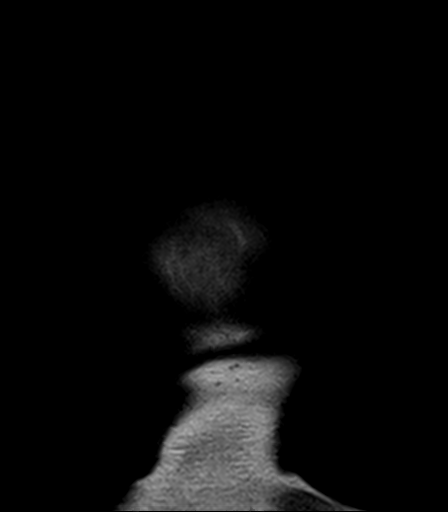

[Series 18: T2-star · axial · 5.0mm · 0.45mm/px · z∈[-102,-24]mm · 2 of 27 slices shown]
[im 1/27]
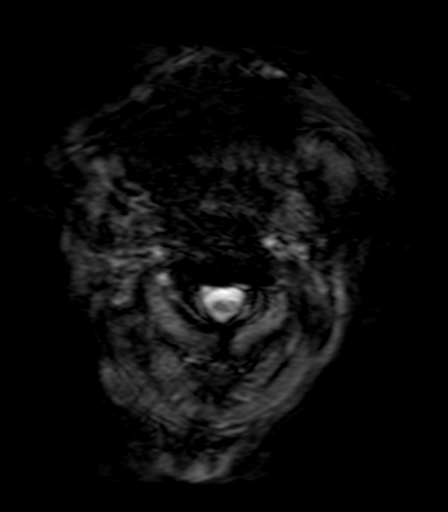
[im 14/27]
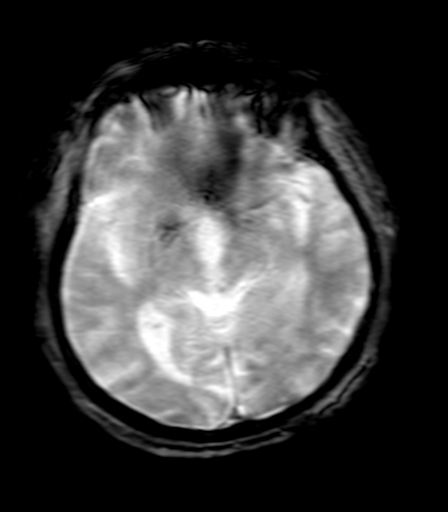

[Series 19: FLAIR · axial · 3.0mm · 1.20mm/px · z∈[-110,+52]mm · 5 of 55 slices shown]
[im 1/55]
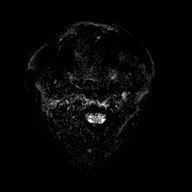
[im 14/55]
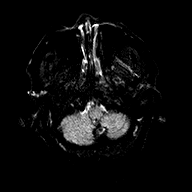
[im 28/55]
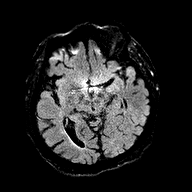
[im 41/55]
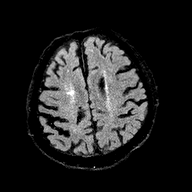
[im 55/55]
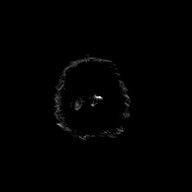

[Series 1013: T2 · axial · 5.0mm · 0.53mm/px · z∈[-104,+45]mm · 2 of 26 slices shown (2 of 2)]
[im 1/26]
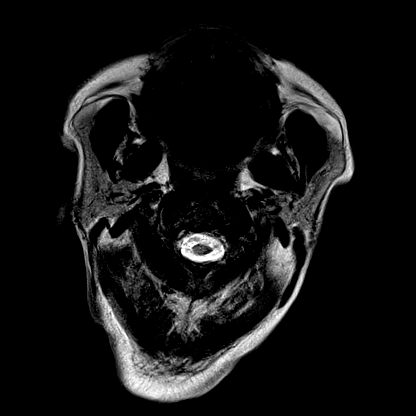
[im 26/26]
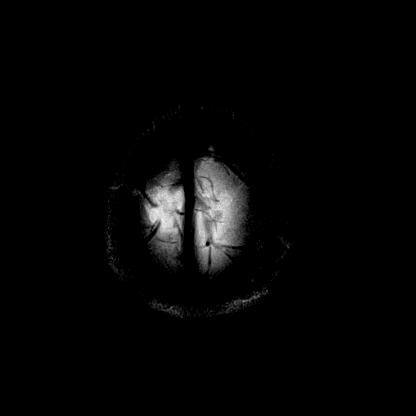

[Series 1026: T1 · axial · 1.0mm · 0.98mm/px · z∈[-103,+50]mm · 8 of 176 slices shown (2 of 2)]
[im 11/176]
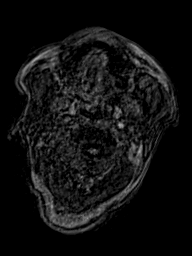
[im 33/176]
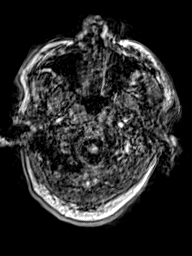
[im 55/176]
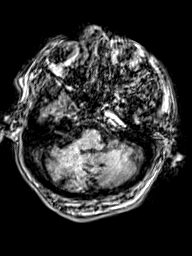
[im 77/176]
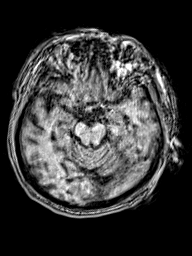
[im 99/176]
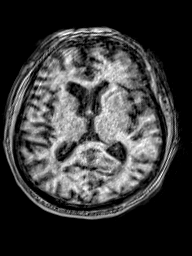
[im 121/176]
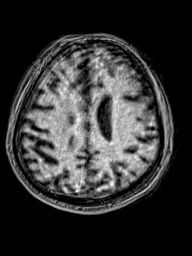
[im 143/176]
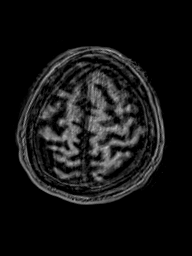
[im 165/176]
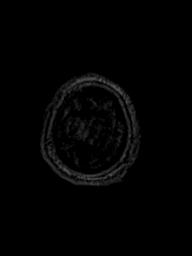

[38 of 48 positions shown; findings below may reference images not displayed]

FINDINGS: Brain:

Multiple sequences are significantly motion degraded, limiting
evaluation. Most notably, there is moderate to moderately severe
motion degradation of the axial T2/FLAIR sequence and
moderate/severe motion degradation of the axial T1 weighted
sequence.

Moderate generalized cerebral atrophy without definite lobar
predominance.

Moderate multifocal T2/FLAIR hyperintensity within the cerebral
white matter is nonspecific, but consistent with chronic small
vessel ischemic disease.

Prominent perivascular spaces within the inferior basal ganglia
bilaterally.

There is no acute infarct.

No evidence of intracranial mass.

No chronic intracranial blood products are identified.

No extra-axial fluid collection.

No midline shift.

Vascular: Expected proximal arterial flow voids.

Skull and upper cervical spine: No focal calvarial marrow lesion. A
mildly displaced odontoid fracture through the base of the dens is
questioned (for instance as seen on series 9, image 12). There is no
appreciable associated edema. Upper cervical spondylosis. This
includes C3-C4 and C4-C5 posterior disc osteophyte which contribute
to at least mild spinal canal narrowing.

Sinuses/Orbits: Visualized orbits show no acute finding. Minimal
ethmoid sinus mucosal thickening. Small bilateral mastoid effusions.

Impression #4 will be called to the ordering clinician or
representative by the Radiologist Assistant, and communication
documented in the PACS or [REDACTED].
IMPRESSION: 1. Motion degraded examination as described.
2. No evidence of acute intracranial abnormality. Specifically,
there is no evidence of acute or recent subacute infarction.
3. Moderate generalized parenchymal atrophy and cerebral white
matter chronic small vessel ischemic disease.
4. A chronic mildly displaced odontoid fracture through the base of
the dens is questioned. If this is not a known finding, CT of the
cervical spine is recommended for further evaluation.
5. Upper cervical spondylosis. This includes C3-C4 and C4-C5
posterior disc osteophytes which contribute to at least mild spinal
canal narrowing.
6. Minimal ethmoid sinus mucosal thickening.
7. Small bilateral mastoid effusions.

## 2019-10-25 ENCOUNTER — Encounter: Payer: Self-pay | Admitting: Oncology

## 2019-10-25 ENCOUNTER — Inpatient Hospital Stay: Payer: Medicare Other | Attending: Oncology | Admitting: Oncology

## 2019-10-25 ENCOUNTER — Inpatient Hospital Stay: Payer: Medicare Other

## 2019-10-25 VITALS — BP 162/58 | HR 52 | Temp 97.6°F | Resp 18 | Ht 66.14 in | Wt 160.0 lb

## 2019-10-25 DIAGNOSIS — I2510 Atherosclerotic heart disease of native coronary artery without angina pectoris: Secondary | ICD-10-CM | POA: Diagnosis not present

## 2019-10-25 DIAGNOSIS — E119 Type 2 diabetes mellitus without complications: Secondary | ICD-10-CM | POA: Insufficient documentation

## 2019-10-25 DIAGNOSIS — Z79899 Other long term (current) drug therapy: Secondary | ICD-10-CM | POA: Diagnosis not present

## 2019-10-25 DIAGNOSIS — Z87891 Personal history of nicotine dependence: Secondary | ICD-10-CM | POA: Diagnosis not present

## 2019-10-25 DIAGNOSIS — I252 Old myocardial infarction: Secondary | ICD-10-CM | POA: Insufficient documentation

## 2019-10-25 DIAGNOSIS — D649 Anemia, unspecified: Secondary | ICD-10-CM

## 2019-10-25 DIAGNOSIS — D631 Anemia in chronic kidney disease: Secondary | ICD-10-CM

## 2019-10-25 DIAGNOSIS — K219 Gastro-esophageal reflux disease without esophagitis: Secondary | ICD-10-CM | POA: Insufficient documentation

## 2019-10-25 DIAGNOSIS — Z794 Long term (current) use of insulin: Secondary | ICD-10-CM | POA: Insufficient documentation

## 2019-10-25 DIAGNOSIS — E785 Hyperlipidemia, unspecified: Secondary | ICD-10-CM | POA: Diagnosis not present

## 2019-10-25 DIAGNOSIS — Z7901 Long term (current) use of anticoagulants: Secondary | ICD-10-CM | POA: Insufficient documentation

## 2019-10-25 DIAGNOSIS — Z8546 Personal history of malignant neoplasm of prostate: Secondary | ICD-10-CM | POA: Insufficient documentation

## 2019-10-25 DIAGNOSIS — I10 Essential (primary) hypertension: Secondary | ICD-10-CM | POA: Insufficient documentation

## 2019-10-25 DIAGNOSIS — I739 Peripheral vascular disease, unspecified: Secondary | ICD-10-CM | POA: Insufficient documentation

## 2019-10-25 DIAGNOSIS — N1832 Chronic kidney disease, stage 3b: Secondary | ICD-10-CM | POA: Diagnosis not present

## 2019-10-25 DIAGNOSIS — F0390 Unspecified dementia without behavioral disturbance: Secondary | ICD-10-CM | POA: Diagnosis not present

## 2019-10-25 HISTORY — DX: Chronic kidney disease, stage 3b: N18.32

## 2019-10-25 HISTORY — DX: Anemia in chronic kidney disease: D63.1

## 2019-10-25 LAB — CBC WITH DIFFERENTIAL/PLATELET
Abs Immature Granulocytes: 0.03 10*3/uL (ref 0.00–0.07)
Basophils Absolute: 0 10*3/uL (ref 0.0–0.1)
Basophils Relative: 1 %
Eosinophils Absolute: 0.1 10*3/uL (ref 0.0–0.5)
Eosinophils Relative: 1 %
HCT: 23.7 % — ABNORMAL LOW (ref 39.0–52.0)
Hemoglobin: 7.6 g/dL — ABNORMAL LOW (ref 13.0–17.0)
Immature Granulocytes: 0 %
Lymphocytes Relative: 16 %
Lymphs Abs: 1.3 10*3/uL (ref 0.7–4.0)
MCH: 26.1 pg (ref 26.0–34.0)
MCHC: 32.1 g/dL (ref 30.0–36.0)
MCV: 81.4 fL (ref 80.0–100.0)
Monocytes Absolute: 0.5 10*3/uL (ref 0.1–1.0)
Monocytes Relative: 6 %
Neutro Abs: 6.3 10*3/uL (ref 1.7–7.7)
Neutrophils Relative %: 76 %
Platelets: 347 10*3/uL (ref 150–400)
RBC: 2.91 MIL/uL — ABNORMAL LOW (ref 4.22–5.81)
RDW: 14.7 % (ref 11.5–15.5)
WBC: 8.3 10*3/uL (ref 4.0–10.5)
nRBC: 0 % (ref 0.0–0.2)

## 2019-10-25 LAB — VITAMIN B12: Vitamin B-12: 442 pg/mL (ref 180–914)

## 2019-10-25 LAB — TECHNOLOGIST SMEAR REVIEW: Plt Morphology: ADEQUATE

## 2019-10-25 LAB — RETIC PANEL
Immature Retic Fract: 12.5 % (ref 2.3–15.9)
RBC.: 2.88 MIL/uL — ABNORMAL LOW (ref 4.22–5.81)
Retic Count, Absolute: 51.3 10*3/uL (ref 19.0–186.0)
Retic Ct Pct: 1.8 % (ref 0.4–3.1)
Reticulocyte Hemoglobin: 28.4 pg (ref >27.9)

## 2019-10-25 LAB — FOLATE: Folate: 11.8 ng/mL (ref >5.9)

## 2019-10-25 LAB — TSH: TSH: 2.596 u[IU]/mL (ref 0.350–4.500)

## 2019-10-25 NOTE — Progress Notes (Signed)
New anemia patient today. Weak with mobility issues in a wheelchair. Uses a walker at home. Naps during the day as he doesn't sleep well at nighttime.

## 2019-10-25 NOTE — Telephone Encounter (Signed)
This is a duplicate, please see separate preop clearance and Dr. Darnelle Bos comment on 9/10.

## 2019-10-25 NOTE — Telephone Encounter (Signed)
We are in the process of working with [facility] to see if able to start services.

## 2019-10-25 NOTE — Telephone Encounter (Signed)
Tuesday I can call him between 1130-12 noon
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
## 2019-10-25 NOTE — Telephone Encounter (Signed)
Please inform patient- I would like to discuss options based on Advise of Dr End- can we set up phone appointment with him/family early next week

## 2019-10-26 ENCOUNTER — Other Ambulatory Visit
Admission: RE | Admit: 2019-10-26 | Discharge: 2019-10-26 | Disposition: A | Discharge status: Home / Self Care | Payer: Medicare Other | Source: Ambulatory Visit | Attending: Gastroenterology | Admitting: Gastroenterology

## 2019-10-26 ENCOUNTER — Ambulatory Visit: Payer: Medicare Other

## 2019-10-26 ENCOUNTER — Other Ambulatory Visit: Payer: Self-pay

## 2019-10-26 DIAGNOSIS — Z20822 Contact with and (suspected) exposure to covid-19: Secondary | ICD-10-CM | POA: Diagnosis not present

## 2019-10-26 DIAGNOSIS — Z01812 Encounter for preprocedural laboratory examination: Secondary | ICD-10-CM | POA: Diagnosis not present

## 2019-10-26 LAB — KAPPA/LAMBDA LIGHT CHAINS
Kappa free light chain: 75.9 mg/L — ABNORMAL HIGH (ref 3.3–19.4)
Kappa, lambda light chain ratio: 2.01 — ABNORMAL HIGH (ref 0.26–1.65)
Lambda free light chains: 37.7 mg/L — ABNORMAL HIGH (ref 5.7–26.3)

## 2019-10-26 LAB — MULTIPLE MYELOMA PANEL, SERUM
Albumin SerPl Elph-Mcnc: 2.8 g/dL — ABNORMAL LOW (ref 2.9–4.4)
Albumin/Glob SerPl: 1.1 (ref 0.7–1.7)
Alpha 1: 0.3 g/dL (ref 0.0–0.4)
Alpha2 Glob SerPl Elph-Mcnc: 0.9 g/dL (ref 0.4–1.0)
B-Globulin SerPl Elph-Mcnc: 1 g/dL (ref 0.7–1.3)
Gamma Glob SerPl Elph-Mcnc: 0.6 g/dL (ref 0.4–1.8)
Globulin, Total: 2.8 g/dL (ref 2.2–3.9)
IgA: 415 mg/dL (ref 61–437)
IgG (Immunoglobin G), Serum: 741 mg/dL (ref 603–1613)
IgM (Immunoglobulin M), Srm: 60 mg/dL (ref 15–143)
Total Protein ELP: 5.6 g/dL — ABNORMAL LOW (ref 6.0–8.5)

## 2019-10-26 LAB — SARS CORONAVIRUS 2 (TAT 6-24 HRS): SARS Coronavirus 2: NEGATIVE

## 2019-10-26 NOTE — Telephone Encounter (Signed)
Referral sent to [facility], Referral, insurance and recent face-to-face faxed to [phone]

## 2019-10-27 ENCOUNTER — Telehealth: Payer: Self-pay

## 2019-10-27 ENCOUNTER — Telehealth: Payer: Self-pay | Admitting: *Deleted

## 2019-10-27 ENCOUNTER — Telehealth: Payer: Self-pay | Admitting: Adult Health Nurse Practitioner

## 2019-10-27 ENCOUNTER — Other Ambulatory Visit: Payer: Self-pay | Admitting: Oncology

## 2019-10-27 DIAGNOSIS — M545 Low back pain: Secondary | ICD-10-CM | POA: Diagnosis not present

## 2019-10-27 DIAGNOSIS — Z87891 Personal history of nicotine dependence: Secondary | ICD-10-CM | POA: Diagnosis not present

## 2019-10-27 DIAGNOSIS — N1832 Chronic kidney disease, stage 3b: Secondary | ICD-10-CM

## 2019-10-27 DIAGNOSIS — G894 Chronic pain syndrome: Secondary | ICD-10-CM | POA: Diagnosis not present

## 2019-10-27 DIAGNOSIS — R2689 Other abnormalities of gait and mobility: Secondary | ICD-10-CM | POA: Diagnosis not present

## 2019-10-27 DIAGNOSIS — D631 Anemia in chronic kidney disease: Secondary | ICD-10-CM

## 2019-10-27 DIAGNOSIS — D509 Iron deficiency anemia, unspecified: Secondary | ICD-10-CM | POA: Diagnosis not present

## 2019-10-27 DIAGNOSIS — I10 Essential (primary) hypertension: Secondary | ICD-10-CM | POA: Diagnosis not present

## 2019-10-27 DIAGNOSIS — F329 Major depressive disorder, single episode, unspecified: Secondary | ICD-10-CM | POA: Diagnosis not present

## 2019-10-27 DIAGNOSIS — E1142 Type 2 diabetes mellitus with diabetic polyneuropathy: Secondary | ICD-10-CM | POA: Diagnosis not present

## 2019-10-27 NOTE — Telephone Encounter (Signed)
Done.. Appts has been sched as requested Venofer x2 1st dose NEW on 9/24 Then will RTC in 6weeks per request labs on 10/29 and MD +/- Venofer +/- NEW Retacrit on 11/1

## 2019-10-27 NOTE — Telephone Encounter (Signed)
Tried to return call to [place].  Phone number provided is not a working number.

## 2019-10-27 NOTE — Telephone Encounter (Signed)
Verbal order given per Dr. Einar Pheasant.

## 2019-10-27 NOTE — Telephone Encounter (Signed)
   [facility] Pre-operative Risk Assessment    HEARTCARE STAFF: - Please ensure there is not already an duplicate clearance open for this procedure. - Under Visit Info/Reason for Call, type in Other and utilize the format Clearance MM/DD/YY or Clearance TBD. Do not use dashes or single digits. - If request is for dental extraction, please clarify the # of teeth to be extracted.  Request for surgical clearance:  1. What type of surgery is being performed?  COLONOSCOPY   2. When is this surgery scheduled?  10/28/19   3. What type of clearance is required (medical clearance vs. Pharmacy clearance to hold med vs. Both)?  BOTH  4. Are there any medications that need to be held prior to surgery and how long? ASPIRIN & PLAVIX   5. Practice name and name of physician performing surgery?  [facility]   6. What is the office phone number?  [phone]   7.   What is the office fax number?  [phone]  ATTN:  JOVON  8.   Anesthesia type (None, local, MAC, general) ?     Jeanann Lewandowsky 10/27/2019, 3:15 PM  _________________________________________________________________   (provider comments below)

## 2019-10-27 NOTE — Telephone Encounter (Signed)
-----   Message from Earlie Server, MD sent at 10/27/2019  8:35 AM EDT ----- Please let patient know that I recommend to further increase his iron stores.  Recommend IV Venofer weekly x2.  If he agrees please arrange. Follow-up in 6 weeks, labs-please order CBC, iron, TIBC, ferritin, reticulocyte panel, a few days prior to MD +/- Venofer +/- Retacrit.  Can offer virtual visit if he has capacity

## 2019-10-27 NOTE — Telephone Encounter (Signed)
-----   Message from Abigail Butts, PA-C sent at 10/27/2019  3:09 PM EDT ----- Can you generate a formal request for this? I dont see anything in his chart that has addressed this question. Thank you! ----- Message ----- From: [name], MD Sent: 10/13/2019   8:04 PM EDT To: [facility]   ----- Message ----- From: Storm Frisk, CMA Sent: 10/13/2019  11:55 AM EDT To: [name], MD

## 2019-10-27 NOTE — Telephone Encounter (Signed)
Agree with [facility] PT as requested

## 2019-10-27 NOTE — Telephone Encounter (Signed)
   Primary Cardiologist: [name], MD  Chart reviewed as part of pre-operative protocol coverage. This appears to be a duplicate clearance that was already addressed in a previous note. Peter [name] was last seen on 09/15/19 by Laurann Montana, NP. History outlined with CAD s/p prior RCA and more recent LAD stenting, PVC, DM2, HTN, HLD, CKDIII, diastolic dysfunction, prostate cancer, syncope. Had recent PCI/DES 06/2019. He has also had multiple recent issues with syncope, decreased responsiveness or dehydration. Beta blocker reduced at most recent visit. Of note he also recently underwent PV intervention on 10/08/19 by vascular surgery.  Per Dr. Darnelle Bos recent comments on 10/22/19, "From a cardiac standpoint, I would prefer that [name] remain on dual antiplatelet therapy for at least 6 months from the time of his PCI to the proximal LAD performed in 06/2019, ideally longer.  More acutely, he underwent right lower extremity intervention by vascular surgery with placement of a Viabahn covered stent about 2 weeks ago.  I would be very reluctant to discontinue aspirin and clopidogrel at this time given high risk for acute thrombosis of this vascular stent and associated acute limb ischemia.  If colonoscopy can be performed on aspirin and clopidogrel, I think it would be reasonable to proceed from a heart standpoint.  I would not discontinue any antiplatelet therapy at this time without clearance from vascular surgery."  Per EMR notes, tomorrow's procedure was cancelled and the GI team has arranged a follow-up visit to discuss further.  I will route this recommendation to the requesting party via Epic fax function and remove from pre-op pool. Please call with questions.  Charlie Pitter, PA-C 10/27/2019, 3:56 PM

## 2019-10-27 NOTE — Telephone Encounter (Signed)
Patient notified and is agreeable to IV venofer. Please schedule as requested and check with pt if virtual is an option. Venofer/retacrit  Are both  *new*

## 2019-10-27 NOTE — Telephone Encounter (Signed)
Meredith Leeds PT with [facility] left a voicemail stating that she is doing an evaluation for reassessment on patient. Meredith Leeds stated that she is wanting to get an order for PT for twice a week for three weeks and once a week for one week. Meredith Leeds stated that it is okay to leave the verbal order on her confidential voicemail.

## 2019-10-27 NOTE — Telephone Encounter (Signed)
Rec'd email notification from our [facility] Palliative RN and SW requesting a visit be scheduled with NP.  Called patient and spoke with wife Vaughan Basta, and have scheduled an In-person visit for 11/03/19 @ 2 PM with NP.

## 2019-10-28 ENCOUNTER — Ambulatory Visit: Payer: Medicare Other

## 2019-10-28 ENCOUNTER — Encounter: Admission: RE | Payer: Self-pay | Source: Home / Self Care

## 2019-10-28 ENCOUNTER — Ambulatory Visit: Admission: RE | Admit: 2019-10-28 | Payer: Medicare Other | Source: Home / Self Care | Admitting: Gastroenterology

## 2019-10-28 SURGERY — COLONOSCOPY WITH PROPOFOL
Anesthesia: General

## 2019-10-29 DIAGNOSIS — M545 Low back pain: Secondary | ICD-10-CM | POA: Diagnosis not present

## 2019-10-29 DIAGNOSIS — G894 Chronic pain syndrome: Secondary | ICD-10-CM | POA: Diagnosis not present

## 2019-10-29 DIAGNOSIS — I10 Essential (primary) hypertension: Secondary | ICD-10-CM | POA: Diagnosis not present

## 2019-10-29 DIAGNOSIS — E1142 Type 2 diabetes mellitus with diabetic polyneuropathy: Secondary | ICD-10-CM | POA: Diagnosis not present

## 2019-10-29 DIAGNOSIS — D509 Iron deficiency anemia, unspecified: Secondary | ICD-10-CM | POA: Diagnosis not present

## 2019-10-29 DIAGNOSIS — F329 Major depressive disorder, single episode, unspecified: Secondary | ICD-10-CM | POA: Diagnosis not present

## 2019-11-01 ENCOUNTER — Other Ambulatory Visit (INDEPENDENT_AMBULATORY_CARE_PROVIDER_SITE_OTHER): Payer: Self-pay | Admitting: Vascular Surgery

## 2019-11-01 DIAGNOSIS — Z87891 Personal history of nicotine dependence: Secondary | ICD-10-CM

## 2019-11-01 DIAGNOSIS — I10 Essential (primary) hypertension: Secondary | ICD-10-CM

## 2019-11-01 DIAGNOSIS — Z95820 Peripheral vascular angioplasty status with implants and grafts: Secondary | ICD-10-CM

## 2019-11-01 DIAGNOSIS — D509 Iron deficiency anemia, unspecified: Secondary | ICD-10-CM

## 2019-11-01 DIAGNOSIS — I70239 Atherosclerosis of native arteries of right leg with ulceration of unspecified site: Secondary | ICD-10-CM

## 2019-11-01 DIAGNOSIS — E1142 Type 2 diabetes mellitus with diabetic polyneuropathy: Secondary | ICD-10-CM

## 2019-11-01 DIAGNOSIS — R2689 Other abnormalities of gait and mobility: Secondary | ICD-10-CM

## 2019-11-01 DIAGNOSIS — M545 Low back pain: Secondary | ICD-10-CM

## 2019-11-01 DIAGNOSIS — F329 Major depressive disorder, single episode, unspecified: Secondary | ICD-10-CM

## 2019-11-01 DIAGNOSIS — G894 Chronic pain syndrome: Secondary | ICD-10-CM

## 2019-11-02 ENCOUNTER — Ambulatory Visit: Payer: Medicare Other

## 2019-11-02 ENCOUNTER — Telehealth (INDEPENDENT_AMBULATORY_CARE_PROVIDER_SITE_OTHER): Payer: Medicare Other | Admitting: Gastroenterology

## 2019-11-02 DIAGNOSIS — E1142 Type 2 diabetes mellitus with diabetic polyneuropathy: Secondary | ICD-10-CM | POA: Diagnosis not present

## 2019-11-02 DIAGNOSIS — F329 Major depressive disorder, single episode, unspecified: Secondary | ICD-10-CM | POA: Diagnosis not present

## 2019-11-02 DIAGNOSIS — D509 Iron deficiency anemia, unspecified: Secondary | ICD-10-CM | POA: Diagnosis not present

## 2019-11-02 DIAGNOSIS — I200 Unstable angina: Secondary | ICD-10-CM

## 2019-11-02 DIAGNOSIS — I10 Essential (primary) hypertension: Secondary | ICD-10-CM | POA: Diagnosis not present

## 2019-11-02 DIAGNOSIS — G894 Chronic pain syndrome: Secondary | ICD-10-CM | POA: Diagnosis not present

## 2019-11-02 DIAGNOSIS — M545 Low back pain: Secondary | ICD-10-CM | POA: Diagnosis not present

## 2019-11-02 NOTE — Progress Notes (Signed)
Peter [name] , MD [address]  [place]  [place], [place] [address]  Main: [phone]  Fax: [phone]   Primary Care Physician: Lesleigh Noe, MD  Virtual Visit via Telephone Note  I connected with patient on 11/02/19 at 11:30 AM EDT by telephone and verified that I am speaking with the correct person using two identifiers.   I discussed the limitations, risks, security and privacy concerns of performing an evaluation and management service by telephone and the availability of in person appointments. I also discussed with the patient that there may be a patient responsible charge related to this service. The patient expressed understanding and agreed to proceed.  Location of Patient: Home Location of Provider: Home Persons involved: Patient and provider only   History of Present Illness:   Discuss about colonoscopy   HPI: Peter [name] is a 86 y.o. male    Summary of history :  Initially referred for stool incontinence on 10/12/2019 . H used to follow with Dr. Alice [name] who has been his primary gastroenterologist last seen at his office in June 2021 for incontinence of feces.  No recent colonoscopy.  08/20/2019: C. difficile test positive on 08/10/2019 and treated with Dificid.  Fecal calprotectin 58: Culture - 10/06/2019 hemoglobin 7.8 g creatinine 1.42.  He is here today with his daughter and his wife.  He says that for the past 6 months he has explosive bowel movements.  Usually occurs without his knowledge.  He cannot make it to the restroom.  Consistency of pudding.  Nonbloody.  Cannot recall when he had the last colonoscopy.  He has some artificial sugars in his diet.  He has had injury of his back.  Has lost sensation in his lower limbs.  Suffers from peripheral vascular disease.  Has issues with urination.  He says he has had issues with his spine with some degree of stenosis. Unclear reason why his hemoglobin is low at 7.8 g.  No recent iron  studies.   Interval history  10/12/2019-11/02/2019  10/14/2019: C diff and GI PCR: negative . Hb 7.6 , MCV 81, Referred to Dr Tasia Catchings for iron deficiency , commenced on IV iron   Cardiology clearance was requested and they suggested that he required continued dual antiplatelet treatment due to recent stent to the LAD.  I called the family today to discuss the reasons why I canceled the procedure.  I explained to them that if I did the procedure I could look but not intervene with measures such as polypectomy.  It would at least explain to [name] if there are large AVMs or an undiagnosed neoplasm present.   Current Outpatient Medications  Medication Sig Dispense Refill  . alendronate (FOSAMAX) 70 MG tablet Take 70 mg by mouth every Saturday.     [name] allopurinol (ZYLOPRIM) 100 MG tablet TAKE 1 TABLET BY MOUTH EVERY DAY 90 tablet 1  . amLODipine (NORVASC) 10 MG tablet TAKE 1 TABLET BY MOUTH EVERY DAY 90 tablet 1  . Ascorbic Acid (VITAMIN C) 1000 MG tablet Take 1,000 mg by mouth 2 (two) times a week.     [name] aspirin EC 81 MG tablet Take 81 mg by mouth every evening.     [name] atorvastatin (LIPITOR) 80 MG tablet Take 1 tablet (80 mg total) by mouth every evening. 90 tablet 0  . beta carotene 25000 UNIT capsule Take 25,000 Units by mouth once a week.     . carvedilol (COREG) 3.125 MG tablet Take 1  tablet (3.125 mg total) by mouth 2 (two) times daily with a meal. 180 tablet 1  . clopidogrel (PLAVIX) 75 MG tablet TAKE 1 TABLET BY MOUTH EVERY DAY (Patient taking differently: Take 75 mg by mouth daily. ) 90 tablet 0  . donepezil (ARICEPT) 10 MG tablet Take 10 mg by mouth daily.     [name] doxazosin (CARDURA) 4 MG tablet Take 1 tablet (4 mg total) by mouth at bedtime. 90 tablet 2  . ferrous sulfate 325 (65 FE) MG tablet TAKE 1 TABLET BY MOUTH EVERY DAY (Patient taking differently: Take 325 mg by mouth daily. ) 90 tablet 3  . finasteride (PROSCAR) 5 MG tablet Take 1 tablet (5 mg total) by mouth daily. 30 tablet 6  . Flaxseed,  Linseed, (FLAX SEED OIL) 1000 MG CAPS Take 1,000 mg by mouth once a week.     . folic acid (FOLVITE) 035 MCG tablet Take 400 mcg by mouth 2 (two) times a week.     . gabapentin (NEURONTIN) 300 MG capsule Take 1 capsule (300 mg total) by mouth 2 (two) times daily. (Patient taking differently: Take 600 mg by mouth 2 (two) times daily. ) 60 capsule 5  . hydrALAZINE (APRESOLINE) 50 MG tablet TAKE 1 TABLET BY MOUTH EVERY 8 HOURS. (Patient taking differently: Take 50 mg by mouth 3 (three) times daily. ) 90 tablet 0  . hydrochlorothiazide (HYDRODIURIL) 12.5 MG tablet Take 12.5 mg by mouth at bedtime.     . Insulin Syringe-Needle U-100 (TRUEPLUS INSULIN SYRINGE) 31G X 5/16" 1 ML MISC Use daily as directed. Dispense needles as prescribed in the past. 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 isosorbide mononitrate (IMDUR) 60 MG 24 hr tablet Take 1 tablet (60 mg total) by mouth daily. 90 tablet 1  . loperamide (IMODIUM) 2 MG capsule Take 1 capsule (2 mg total) by mouth every 4 (four) hours as needed for diarrhea or loose stools. (Patient not taking: Reported on 10/25/2019) 30 capsule 0  . losartan (COZAAR) 100 MG tablet TAKE 1 TABLET BY MOUTH EVERY DAY (Patient taking differently: Take 100 mg by mouth daily. ) 90 tablet 1  . memantine (NAMENDA) 10 MG tablet TAKE 1 TABLET BY MOUTH TWICE A DAY (Patient taking differently: Take 10 mg by mouth in the morning and at bedtime. ) 180 tablet 1  . nitroGLYCERIN (NITROSTAT) 0.4 MG SL tablet Place 1 tablet (0.4 mg total) under the tongue every 5 (five) minutes x 3 doses as needed for chest pain. 25 tablet 1  . Omega-3 Fatty Acids (FISH OIL PO) Take 1 capsule by mouth once a week.    . selenium 50 MCG TABS tablet Take 50 mcg by mouth 2 (two) times a week.     . sertraline (ZOLOFT) 50 MG tablet Take 1 tablet (50 mg total) by mouth at bedtime. 90 tablet 3  . silver sulfADIAZINE (SILVADENE) 1 % cream Apply topically. (Patient not taking: Reported on  10/25/2019)     No current facility-administered medications for this visit.    Allergies as of 11/02/2019 - Review Complete 10/25/2019  Allergen Reaction Noted  . Penicillins Anaphylaxis 04/18/2014    Review of Systems:    All systems reviewed and negative except where noted in HPI.   Observations/Objective:  Labs: CMP     Component Value Date/Time   NA 139 10/06/2019 1419   K 3.9 10/06/2019 1419   CL 106 10/06/2019 1419   CO2 23 10/06/2019 1419  GLUCOSE 124 (H) 10/06/2019 1419   BUN 36 (H) 10/08/2019 1303   CREATININE 1.61 (H) 10/08/2019 1303   CALCIUM 8.0 (L) 10/06/2019 1419   PROT 6.1 (L) 10/06/2019 1419   ALBUMIN 3.2 (L) 10/06/2019 1419   AST 27 10/06/2019 1419   ALT 31 10/06/2019 1419   ALKPHOS 72 10/06/2019 1419   BILITOT 0.6 10/06/2019 1419   GFRNONAA 38 (L) 10/08/2019 1303   GFRAA 44 (L) 10/08/2019 1303   Lab Results  Component Value Date   WBC 8.3 10/25/2019   HGB 7.6 (L) 10/25/2019   HCT 23.7 (L) 10/25/2019   MCV 81.4 10/25/2019   PLT 347 10/25/2019    Imaging Studies: MR BRAIN WO CONTRAST  Result Date: 10/24/2019 CLINICAL DATA:  Cerebrovascular accident, unspecified mechanism. Additional history provided by scanning technologist: Patient reports right arm pain with hand weakness. EXAM: MRI HEAD WITHOUT CONTRAST TECHNIQUE: Multiplanar, multiecho pulse sequences of the brain and surrounding structures were obtained without intravenous contrast. COMPARISON:  No pertinent prior exams are available for comparison. FINDINGS: Brain: Multiple sequences are significantly motion degraded, limiting evaluation. Most notably, there is moderate to moderately severe motion degradation of the axial T2/FLAIR sequence and moderate/severe motion degradation of the axial T1 weighted sequence. Moderate generalized cerebral atrophy without definite lobar predominance. Moderate multifocal T2/FLAIR hyperintensity within the cerebral white matter is nonspecific, but consistent with  chronic small vessel ischemic disease. Prominent perivascular spaces within the inferior basal ganglia bilaterally. There is no acute infarct. No evidence of intracranial mass. No chronic intracranial blood products are identified. No extra-axial fluid collection. No midline shift. Vascular: Expected proximal arterial flow voids. Skull and upper cervical spine: No focal calvarial marrow lesion. A mildly displaced odontoid fracture through the base of the dens is questioned (for instance as seen on series 9, image 12). There is no appreciable associated edema. Upper cervical spondylosis. This includes C3-C4 and C4-C5 posterior disc osteophyte which contribute to at least mild spinal canal narrowing. Sinuses/Orbits: Visualized orbits show no acute finding. Minimal ethmoid sinus mucosal thickening. Small bilateral mastoid effusions. Impression #4 will be called to the ordering clinician or representative by the Radiologist Assistant, and communication documented in the PACS or [employer]. IMPRESSION: 1. Motion degraded examination as described. 2. No evidence of acute intracranial abnormality. Specifically, there is no evidence of acute or recent subacute infarction. 3. Moderate generalized parenchymal atrophy and cerebral white matter chronic small vessel ischemic disease. 4. A chronic mildly displaced odontoid fracture through the base of the dens is questioned. If this is not a known finding, CT of the cervical spine is recommended for further evaluation. 5. Upper cervical spondylosis. This includes C3-C4 and C4-C5 posterior disc osteophytes which contribute to at least mild spinal canal narrowing. 6. Minimal ethmoid sinus mucosal thickening. 7. Small bilateral mastoid effusions. 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 Resection margins are well-defined without evidence to suggest osteomyelitis. Remaining osseous structures appear intact. No cortical destruction or periostitis. No fracture or dislocation. Mild diffuse soft tissue swelling. No soft tissue gas. Vascular calcifications noted. IMPRESSION: 1. No radiographic evidence to suggest osteomyelitis. 2. Prior third and fourth ray amputations  at the level of the metatarsal necks. Electronically Signed   By: Davina Poke D.O.   On: 10/06/2019 15:31    Assessment and Plan:   Kanav Kazmierczak is a 86 y.o. y/o male here to follow up  for incontinence of feces.  Previously seen and evaluated by Dr. Alice [name] at [facility] gastroenterology.  No recent colonoscopy.  Recent history of C. difficile diarrhea treated with Dificid.  His history is suggestive of fecal incontinence.  He feels no urge to have a bowel movement and has accidents without his knowledge.  With the loss of sensation in his lower limbs, history of a back problem, urinary symptoms is possible that his issues are neurological related to his lumbosacral nerves.  Could have added neuropathy related to longstanding diabetes as well.  He is on Aricept which can sometimes cause diarrhea.  He has not had a colonoscopy recently and obviously neoplasm is a consideration which can lead to obstruction and overflow diarrhea.  He is anemic unknown etiology.  Long discussion with the family as the patient cannot go off dual antiplatelet therapy for the procedure as per cardiology.  They have agreed to go ahead with the procedure to at least look and rule out lesion such as a neoplasm.  If there are large polyps that need to be taken not will have to do so after he goes off antiplatelet medication.  They are aware that there is a possibility he may require a second  procedure that is a repetition.  Plan 1.  EGD plus colonoscopy on aspirin and Plavix: I will not perform any polypectomy.  I will perform a detailed digital examination of his anus to determine his anal tone.  Options down the road include trial of holding his Aricept, evaluation of his back and orthopedic specialist.  I have discussed alternative options, risks & benefits,  which include, but are not limited to, bleeding, infection, perforation,respiratory complication & drug reaction.  The patient agrees with this plan & written consent will be obtained.       I discussed the assessment and treatment plan with the patient. The patient was provided an opportunity to ask questions and all were answered. The patient agreed with the plan and demonstrated an understanding of the instructions.   The patient was advised to call back or seek an in-person evaluation if the symptoms worsen or if the condition fails to improve as anticipated.  I provided 15 minutes of non-face-to-face time during this encounter.  Dr Peter Bellows MD,MRCP [facility]) Gastroenterology/Hepatology Pager: [phone]   Speech recognition software was used to dictate this note.

## 2019-11-03 ENCOUNTER — Other Ambulatory Visit: Payer: Medicare Other | Admitting: Adult Health Nurse Practitioner

## 2019-11-03 ENCOUNTER — Other Ambulatory Visit: Payer: Self-pay

## 2019-11-03 DIAGNOSIS — G8929 Other chronic pain: Secondary | ICD-10-CM

## 2019-11-03 DIAGNOSIS — Z515 Encounter for palliative care: Secondary | ICD-10-CM | POA: Diagnosis not present

## 2019-11-03 DIAGNOSIS — M545 Low back pain, unspecified: Secondary | ICD-10-CM

## 2019-11-04 ENCOUNTER — Telehealth (INDEPENDENT_AMBULATORY_CARE_PROVIDER_SITE_OTHER): Payer: Self-pay

## 2019-11-04 ENCOUNTER — Ambulatory Visit: Payer: Medicare Other

## 2019-11-04 ENCOUNTER — Ambulatory Visit (INDEPENDENT_AMBULATORY_CARE_PROVIDER_SITE_OTHER): Payer: Medicare Other | Admitting: Vascular Surgery

## 2019-11-04 ENCOUNTER — Other Ambulatory Visit: Payer: Self-pay

## 2019-11-04 ENCOUNTER — Other Ambulatory Visit: Payer: Self-pay | Admitting: Family Medicine

## 2019-11-04 ENCOUNTER — Encounter (INDEPENDENT_AMBULATORY_CARE_PROVIDER_SITE_OTHER): Payer: Self-pay | Admitting: Vascular Surgery

## 2019-11-04 ENCOUNTER — Telehealth: Payer: Self-pay | Admitting: Adult Health Nurse Practitioner

## 2019-11-04 ENCOUNTER — Ambulatory Visit (INDEPENDENT_AMBULATORY_CARE_PROVIDER_SITE_OTHER): Payer: Medicare Other

## 2019-11-04 VITALS — BP 163/61 | HR 50 | Resp 16 | Wt 160.4 lb

## 2019-11-04 DIAGNOSIS — N183 Chronic kidney disease, stage 3 unspecified: Secondary | ICD-10-CM

## 2019-11-04 DIAGNOSIS — D509 Iron deficiency anemia, unspecified: Secondary | ICD-10-CM | POA: Diagnosis not present

## 2019-11-04 DIAGNOSIS — E782 Mixed hyperlipidemia: Secondary | ICD-10-CM

## 2019-11-04 DIAGNOSIS — I200 Unstable angina: Secondary | ICD-10-CM

## 2019-11-04 DIAGNOSIS — G894 Chronic pain syndrome: Secondary | ICD-10-CM | POA: Diagnosis not present

## 2019-11-04 DIAGNOSIS — I10 Essential (primary) hypertension: Secondary | ICD-10-CM | POA: Diagnosis not present

## 2019-11-04 DIAGNOSIS — I70223 Atherosclerosis of native arteries of extremities with rest pain, bilateral legs: Secondary | ICD-10-CM

## 2019-11-04 DIAGNOSIS — Z794 Long term (current) use of insulin: Secondary | ICD-10-CM

## 2019-11-04 DIAGNOSIS — E1142 Type 2 diabetes mellitus with diabetic polyneuropathy: Secondary | ICD-10-CM | POA: Diagnosis not present

## 2019-11-04 DIAGNOSIS — I70239 Atherosclerosis of native arteries of right leg with ulceration of unspecified site: Secondary | ICD-10-CM | POA: Diagnosis not present

## 2019-11-04 DIAGNOSIS — F329 Major depressive disorder, single episode, unspecified: Secondary | ICD-10-CM | POA: Diagnosis not present

## 2019-11-04 DIAGNOSIS — M545 Low back pain: Secondary | ICD-10-CM | POA: Diagnosis not present

## 2019-11-04 DIAGNOSIS — I2510 Atherosclerotic heart disease of native coronary artery without angina pectoris: Secondary | ICD-10-CM

## 2019-11-04 DIAGNOSIS — E1122 Type 2 diabetes mellitus with diabetic chronic kidney disease: Secondary | ICD-10-CM

## 2019-11-04 DIAGNOSIS — Z95820 Peripheral vascular angioplasty status with implants and grafts: Secondary | ICD-10-CM
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
## 2019-11-04 NOTE — Telephone Encounter (Signed)
Spoke with patient and his wife about prescribing morphine IR 15 mg take half tab Q6hr PRN for pain and [facility] [place]. Added addendum to yesterday's note with more detail. Tawanna Funk K. Olena Heckle NP

## 2019-11-04 NOTE — Progress Notes (Addendum)
[place] Consult Note Telephone: [phone]  Fax: [phone]  PATIENT NAME: Peter [name] DOB: 1930/12/12 MRN: [id number]  PRIMARY CARE PROVIDER:   Lesleigh Noe, MD  REFERRING PROVIDER:  Lesleigh Noe, MD [address] [place],  [place] [id number]  RESPONSIBLE PARTY:   Peter [name], wife H: [phone] C: [place], daughter [phone]    RECOMMENDATIONS and PLAN:  1.  Advanced care planning. Patient is DNR.  Advanced directives uploaded to Epic.   2.  Pain.  Today's visit was to evaluate pain.  Patient describes low back and right leg/knee pain that he rates 8/10 on the pain scale, states that it is a constant excruciating pain that has been going on for about a year.  Describes it more as aching.  Imaging from a year ago does show degenerative changes in hips, spine, and sacroiliac joint. Has to use a walker to ambulate as the pain does make him bend forward while walking. Did observe this in the today. Patient did have his right leg on the coffee table and when he moved it off the table his leg was more fell due to weakness than it was a controlled movement. He has had several falls due to weakness in the right leg.  Has had on occasion EMS come to help him up after falls.  Is currently with home health PT.  Patient states that the pain is depressing and he just wants relief. He finds it hard to get comfortable at night to get much sleep. Has tried joint injections with no relief.  Has also tried gabapentin, tramadol, hydrocodone, Tylenol, ibuprofen, Aleve, and topical creams with little or no relief.  Does state that one time in the hospital was given morphine which helped.  He is hesitant to use narcotics due to risk of addiction but has been in pain for so long that he wants relief and is willing to try morphine at home.  Recommend low dose of morphine such as 5 mg every 6 hours and will reach out to PCP with this  recommendation.  If PCP okay with this recommendation will monitor for effectiveness and titrate as needed.  Have appointment next week for follow up. Did have discussion with patient on the importance of PT for reconditioning.  Hopefully better pain management will allow the PT sessions to be more beneficial for the patient and allow him to participate more.    Update:  Secure chat 11/04/19 with Dr. Einar Pheasant and this provider eprescribed morphine immediate release 15 mg take half tab (7.5mg ) Q 6 hrs [facility] [place]  Spoke with patient and wife about the prescription.  To take half tab and if that made patient too drowsy that he could take half of that.  Will call patient tomorrow to see how he is doing before the weekend and have appointment on Monday to reevaluate.  Have looked patient on [place] substance abuse database and is not getting any controlled prescriptions at this time.    I spent 80 minutes providing this consultation,  from 2:00 to 3:20 including time spent with patient/family, chart review, provider coordination, documentation. More than 50% of the time in this consultation was spent coordinating communication.   HISTORY OF PRESENT ILLNESS:  Peter [name] is a 86 y.o. year old male with multiple medical problems including chronic pain in back and right leg, anemia, CKD stage 3, HTN, HLD, PAD, mild dementia. Palliative  Care was asked to help address goals of care. Patient seen today to evaluate pain, see above.  Does show provider second right toe that he states he has been seen for but it has gotten progressively worse.  Today the toe looks necrotic at the top half and just below that is a scant amount of drainage that is yellowish.  He states that he has an appointment this week to have it evaluated.  Did discuss with him that it would not be surprising if they recommended amputation.  Patient has amputated toes to the right foot already.    CODE STATUS: DNR  PPS: 60% HOSPICE  ELIGIBILITY/DIAGNOSIS: TBD  PHYSICAL EXAM:   General: patient in mild distress as he is in pain with walking and appears hard to get comfortable while sitting Extremities: no edema, no joint deformities; does have necrotic second right toe and looks like 3rd and 4th right toes have been amputated Neurological: Weakness; A&O x3 but has forgetfulness and will repeat himself   PAST MEDICAL HISTORY:  Past Medical History:  Diagnosis Date  . Allergy   . Anemia due to stage 3b chronic kidney disease 10/25/2019  . Arthritis   . Carpal tunnel syndrome   . CKD (chronic kidney disease), stage III   . Coronary artery disease    a. 03/2018 PCI [facility]): RCA 95p, 107m (2.5x38 [place] DES); b. 06/2019 PCI: LM 40ost, LAD 85p, 60p/m (atherectomy w/ 3.0x48 Synergy DES p/m LAD), 74m, LCX 50d, OM2/3 50, RCA patent prox/mid stent, 50d, RPDA 50.  . Diabetes mellitus without complication ([place])   . Diastolic dysfunction    a. 06/2019 Echo: EF 60-65%, no rwma, mod LVH, Gr1 DD. Nl RV fxn. Mildly dil LA. Mild-mod TR.  . Diverticulitis   . GERD (gastroesophageal reflux disease)   . Gout   . History of blood transfusion   . History of chicken pox   . Hyperlipidemia   . Hypertension   . Mild dementia ([place])   . Myocardial infarction ([place]) 03/2018  . Normocytic anemia    a. 06/2019 s/p 1u PRBCs.  [name] PAD (peripheral artery disease) ([place])    a. 11/2018 s/p R SFA, popliteal, and peroneal artery PTA.  . Prostate cancer [facility])    prostate  . Prostate cancer ([place])   . Syncope     SOCIAL HX:  Social History   Tobacco Use  . Smoking status: Former Smoker    Packs/day: 0.75    Years: 12.00    Pack years: 9.00    Types: Cigars, Cigarettes    Quit date: 1970    Years since quitting: 51.7  . Smokeless tobacco: Former Systems developer    Types: [place] date: 2016  Substance Use Topics  . Alcohol use: Yes    Comment: occasional    ALLERGIES:  Allergies  Allergen Reactions  . Penicillins Anaphylaxis      Reported airway compromise     PERTINENT MEDICATIONS:  Outpatient Encounter Medications as of 11/03/2019  Medication Sig  . alendronate (FOSAMAX) 70 MG tablet Take 70 mg by mouth every Saturday.   [name] allopurinol (ZYLOPRIM) 100 MG tablet TAKE 1 TABLET BY MOUTH EVERY DAY  . amLODipine (NORVASC) 10 MG tablet TAKE 1 TABLET BY MOUTH EVERY DAY  . Ascorbic Acid (VITAMIN C) 1000 MG tablet Take 1,000 mg by mouth 2 (two) times a week.   [name] aspirin EC 81 MG tablet Take 81 mg by mouth every evening.   [name]  atorvastatin (LIPITOR) 80 MG tablet Take 1 tablet (80 mg total) by mouth every evening.  . beta carotene 25000 UNIT capsule Take 25,000 Units by mouth once a week.   . carvedilol (COREG) 3.125 MG tablet Take 1 tablet (3.125 mg total) by mouth 2 (two) times daily with a meal.  . clopidogrel (PLAVIX) 75 MG tablet TAKE 1 TABLET BY MOUTH EVERY DAY (Patient taking differently: Take 75 mg by mouth daily. )  . donepezil (ARICEPT) 10 MG tablet Take 10 mg by mouth daily.   [name] doxazosin (CARDURA) 4 MG tablet Take 1 tablet (4 mg total) by mouth at bedtime.  . ferrous sulfate 325 (65 FE) MG tablet TAKE 1 TABLET BY MOUTH EVERY DAY (Patient taking differently: Take 325 mg by mouth daily. )  . finasteride (PROSCAR) 5 MG tablet Take 1 tablet (5 mg total) by mouth daily.  . Flaxseed, Linseed, (FLAX SEED OIL) 1000 MG CAPS Take 1,000 mg by mouth once a week.   . folic acid (FOLVITE) 720 MCG tablet Take 400 mcg by mouth 2 (two) times a week.   . gabapentin (NEURONTIN) 300 MG capsule Take 1 capsule (300 mg total) by mouth 2 (two) times daily. (Patient taking differently: Take 600 mg by mouth 2 (two) times daily. )  . hydrALAZINE (APRESOLINE) 50 MG tablet TAKE 1 TABLET BY MOUTH EVERY 8 HOURS. (Patient taking differently: Take 50 mg by mouth 3 (three) times daily. )  . hydrochlorothiazide (HYDRODIURIL) 12.5 MG tablet Take 12.5 mg by mouth at bedtime.   . Insulin Syringe-Needle U-100 (TRUEPLUS INSULIN SYRINGE) 31G X 5/16" 1 ML  MISC Use daily as directed. Dispense needles as prescribed in the past. DX: E11.22 (Patient not taking: Reported on 10/25/2019)  . isosorbide mononitrate (IMDUR) 60 MG 24 hr tablet Take 1 tablet (60 mg total) by mouth daily.  [name] loperamide (IMODIUM) 2 MG capsule Take 1 capsule (2 mg total) by mouth every 4 (four) hours as needed for diarrhea or loose stools. (Patient not taking: Reported on 10/25/2019)  . losartan (COZAAR) 100 MG tablet TAKE 1 TABLET BY MOUTH EVERY DAY (Patient taking differently: Take 100 mg by mouth daily. )  . memantine (NAMENDA) 10 MG tablet TAKE 1 TABLET BY MOUTH TWICE A DAY (Patient taking differently: Take 10 mg by mouth in the morning and at bedtime. )  . nitroGLYCERIN (NITROSTAT) 0.4 MG SL tablet Place 1 tablet (0.4 mg total) under the tongue every 5 (five) minutes x 3 doses as needed for chest pain.  . Omega-3 Fatty Acids (FISH OIL PO) Take 1 capsule by mouth once a week.  . selenium 50 MCG TABS tablet Take 50 mcg by mouth 2 (two) times a week.   . sertraline (ZOLOFT) 50 MG tablet Take 1 tablet (50 mg total) by mouth at bedtime.  . silver sulfADIAZINE (SILVADENE) 1 % cream Apply topically. (Patient not taking: Reported on 10/25/2019)   No facility-administered encounter medications on file as of 11/03/2019.     [place] Jenetta Downer, NP

## 2019-11-04 NOTE — Telephone Encounter (Signed)
Spoke with the patient's wife and the patient is scheduled with Dr. Delana Meyer on 11/09/19 with a 9:30 am arrival time to the [facility]. Covid testing on 11/05/19 between 8-1 pm at the [facility]. Pre-procedure instructions were discussed with the patient his wife and another caregiver and will be mailed.
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
## 2019-11-05 ENCOUNTER — Other Ambulatory Visit
Admission: RE | Admit: 2019-11-05 | Discharge: 2019-11-05 | Disposition: A | Discharge status: Home / Self Care | Payer: Medicare Other | Source: Ambulatory Visit | Attending: Vascular Surgery | Admitting: Vascular Surgery

## 2019-11-05 ENCOUNTER — Inpatient Hospital Stay: Payer: Medicare Other

## 2019-11-05 VITALS — BP 152/49 | HR 49 | Temp 98.0°F | Resp 18

## 2019-11-05 DIAGNOSIS — Z20822 Contact with and (suspected) exposure to covid-19: Secondary | ICD-10-CM | POA: Diagnosis not present

## 2019-11-05 DIAGNOSIS — Z8546 Personal history of malignant neoplasm of prostate: Secondary | ICD-10-CM | POA: Diagnosis not present

## 2019-11-05 DIAGNOSIS — Z01812 Encounter for preprocedural laboratory examination: Secondary | ICD-10-CM | POA: Insufficient documentation

## 2019-11-05 DIAGNOSIS — D631 Anemia in chronic kidney disease: Secondary | ICD-10-CM | POA: Diagnosis not present

## 2019-11-05 DIAGNOSIS — I739 Peripheral vascular disease, unspecified: Secondary | ICD-10-CM | POA: Diagnosis not present

## 2019-11-05 DIAGNOSIS — E119 Type 2 diabetes mellitus without complications: Secondary | ICD-10-CM | POA: Diagnosis not present

## 2019-11-05 DIAGNOSIS — N1832 Chronic kidney disease, stage 3b: Secondary | ICD-10-CM | POA: Diagnosis not present

## 2019-11-05 DIAGNOSIS — I2510 Atherosclerotic heart disease of native coronary artery without angina pectoris: Secondary | ICD-10-CM | POA: Diagnosis not present

## 2019-11-05 MED ORDER — SODIUM CHLORIDE 0.9 % IV SOLN: 200.0000 mg | Freq: Once | INTRAVENOUS | Status: DC

## 2019-11-05 MED ORDER — SODIUM CHLORIDE 0.9 % IV SOLN
Freq: Once | INTRAVENOUS | Status: AC
  Filled 2019-11-05: qty 250

## 2019-11-05 MED ORDER — IRON SUCROSE 20 MG/ML IV SOLN
200.0000 mg | Freq: Once | INTRAVENOUS | Status: AC
  Administered 2019-11-05: 200 mg via INTRAVENOUS
  Filled 2019-11-05: qty 10

## 2019-11-06 LAB — SARS CORONAVIRUS 2 (TAT 6-24 HRS): SARS Coronavirus 2: NEGATIVE

## 2019-11-08 ENCOUNTER — Other Ambulatory Visit: Payer: Self-pay

## 2019-11-08 ENCOUNTER — Other Ambulatory Visit: Payer: Medicare Other | Admitting: Adult Health Nurse Practitioner

## 2019-11-08 DIAGNOSIS — G894 Chronic pain syndrome: Secondary | ICD-10-CM | POA: Diagnosis not present

## 2019-11-08 DIAGNOSIS — E1142 Type 2 diabetes mellitus with diabetic polyneuropathy: Secondary | ICD-10-CM | POA: Diagnosis not present

## 2019-11-08 DIAGNOSIS — F329 Major depressive disorder, single episode, unspecified: Secondary | ICD-10-CM | POA: Diagnosis not present

## 2019-11-08 DIAGNOSIS — I10 Essential (primary) hypertension: Secondary | ICD-10-CM | POA: Diagnosis not present

## 2019-11-08 DIAGNOSIS — D509 Iron deficiency anemia, unspecified: Secondary | ICD-10-CM | POA: Diagnosis not present

## 2019-11-08 DIAGNOSIS — M25561 Pain in right knee: Secondary | ICD-10-CM | POA: Diagnosis not present

## 2019-11-08 DIAGNOSIS — M545 Low back pain, unspecified: Secondary | ICD-10-CM

## 2019-11-08 DIAGNOSIS — Z515 Encounter for palliative care: Secondary | ICD-10-CM

## 2019-11-08 DIAGNOSIS — G8929 Other chronic pain: Secondary | ICD-10-CM | POA: Diagnosis not present

## 2019-11-08 NOTE — Progress Notes (Signed)
[facility] Consult Note Telephone: [phone]  Fax: [phone]  PATIENT NAME: Peter [name] DOB: 1930/06/25 MRN: [id number]  PRIMARY CARE PROVIDER:   Lesleigh Noe, MD  REFERRING PROVIDER:  Lesleigh Noe, MD [place],  [place] [address]  RESPONSIBLE PARTY:   Amrom Ore, wife H: [phone] C: [place], daughter [phone]    RECOMMENDATIONS and PLAN:  1.  Advanced care planning. Patient is DNR.  Advanced directives uploaded to Epic  2. Pain.  This visit is to follow-up on patient's pain after starting morphine 7.5 mg every 6 hours as needed.  Spoke with patient and wife 3 days ago and this was not helping.  Advised that he could take a whole tab at 15 mg every 4 hours to see if this would give relief.  Patient and his wife state that this did not help with the pain but it did make him sleepy and he did have a fall.  Patient already has frequent falls.  Due to his increased sleepiness and increased falls will not increase the dosage of morphine.  Discussed trying oxycodone 5 mg every 4 hours as needed.  Patient and wife in agreement.  Did discuss that if this too causes increase drowsiness and falls that we will not pursue any further opioid pain management for this patient.  Patient and wife expressed understanding.  Discussed bowel regimen while on opioids.  [facility] for oxycodone 5 mg every 4 hours as needed for pain.  Will call in 2 days to check on effectiveness.  Have follow-up appointment in 1 week   I spent 40 minutes providing this consultation,  from 12:00 to 12:40. More than 50% of the time in this consultation was spent coordinating communication.   HISTORY OF PRESENT ILLNESS:  Peter [name] is a 86 y.o. year old male with multiple medical problems including chronic pain in back and right leg, anemia, CKD stage 3, HTN, HLD, PAD, mild dementia.  Palliative Care was asked to help address goals of care.   CODE STATUS: DNR  PPS: 60% HOSPICE ELIGIBILITY/DIAGNOSIS: TBD  PHYSICAL EXAM:   General: NAD, frail appearing,Extremities: no edema, no joint deformities Skin: no rashes on exposed skin Neurological: Weakness; A&O x3 but has forgetfulness and will repeat himself   PAST MEDICAL HISTORY:  Past Medical History:  Diagnosis Date   Allergy    Anemia due to stage 3b chronic kidney disease 10/25/2019   Arthritis    Carpal tunnel syndrome    CKD (chronic kidney disease), stage III    Coronary artery disease    a. 03/2018 PCI [facility]): RCA 95p, 65m (2.5x38 [place] DES); b. 06/2019 PCI: LM 40ost, LAD 85p, 60p/m (atherectomy w/ 3.0x48 Synergy DES p/m LAD), 85m, LCX 50d, OM2/3 50, RCA patent prox/mid stent, 50d, RPDA 50.   Diabetes mellitus without complication ([facility])    Diastolic dysfunction    a. 06/2019 Echo: EF 60-65%, no rwma, mod LVH, Gr1 DD. Nl RV fxn. Mildly dil LA. Mild-mod TR.   Diverticulitis    GERD (gastroesophageal reflux disease)    Gout    History of blood transfusion    History of chicken pox    Hyperlipidemia    Hypertension    Mild dementia ([place])    Myocardial infarction ([place]) 03/2018   Normocytic anemia    a. 06/2019 s/p 1u PRBCs.   PAD (peripheral artery disease) (  [place])    a. 11/2018 s/p R SFA, popliteal, and peroneal artery PTA.   Prostate cancer ([place])    prostate   Prostate cancer ([place])    Syncope     SOCIAL HX:  Social History   Tobacco Use   Smoking status: Former Smoker    Packs/day: 0.75    Years: 12.00    Pack years: 9.00    Types: Cigars, Cigarettes    Quit date: 1970    Years since quitting: 51.7   Smokeless tobacco: Former Systems developer    Types: [place] date: 2016  Substance Use Topics   Alcohol use: Yes    Comment: occasional    ALLERGIES:  Allergies  Allergen Reactions   Penicillins Anaphylaxis    Reported airway compromise 50 years       PERTINENT MEDICATIONS:  Outpatient Encounter Medications as of 11/08/2019  Medication Sig   alendronate (FOSAMAX) 70 MG tablet Take 70 mg by mouth every Saturday.    allopurinol (ZYLOPRIM) 100 MG tablet TAKE 1 TABLET BY MOUTH EVERY DAY (Patient taking differently: Take 100 mg by mouth daily. )   amLODipine (NORVASC) 10 MG tablet TAKE 1 TABLET BY MOUTH EVERY DAY (Patient taking differently: Take 10 mg by mouth daily. )   Ascorbic Acid (VITAMIN C) 1000 MG tablet Take 1,000 mg by mouth once a week.    aspirin EC 81 MG tablet Take 81 mg by mouth every evening.    atorvastatin (LIPITOR) 80 MG tablet Take 1 tablet (80 mg total) by mouth every evening. (Patient taking differently: Take 40 mg by mouth every evening. )   beta carotene 25000 UNIT capsule Take 25,000 Units by mouth once a week.    carvedilol (COREG) 3.125 MG tablet Take 1 tablet (3.125 mg total) by mouth 2 (two) times daily with a meal.   clopidogrel (PLAVIX) 75 MG tablet TAKE 1 TABLET BY MOUTH EVERY DAY (Patient taking differently: Take 75 mg by mouth daily. )   donepezil (ARICEPT) 10 MG tablet Take 10 mg by mouth daily.    doxazosin (CARDURA) 4 MG tablet Take 1 tablet (4 mg total) by mouth at bedtime. (Patient taking differently: Take 2 mg by mouth at bedtime. )   ferrous sulfate 325 (65 FE) MG tablet TAKE 1 TABLET BY MOUTH EVERY DAY (Patient taking differently: Take 325 mg by mouth daily. )   fidaxomicin (DIFICID) 200 MG TABS tablet Take 200 mg by mouth 2 (two) times daily.   finasteride (PROSCAR) 5 MG tablet Take 1 tablet (5 mg total) by mouth daily.   Flaxseed, Linseed, (FLAX SEED OIL) 1000 MG CAPS Take 1,000 mg by mouth once a week.    folic acid (FOLVITE) 782 MCG tablet Take 400 mcg by mouth daily as needed (unsure).    gabapentin (NEURONTIN) 300 MG capsule Take 1 capsule (300 mg total) by mouth 2 (two) times daily. (Patient taking differently: Take 300 mg by mouth 3 (three) times daily. )   hydrALAZINE  (APRESOLINE) 50 MG tablet TAKE 1 TABLET BY MOUTH EVERY 8 HOURS. (Patient taking differently: Take 50 mg by mouth 3 (three) times daily. )   hydrochlorothiazide (HYDRODIURIL) 12.5 MG tablet Take 12.5 mg by mouth at bedtime.    Insulin Syringe-Needle U-100 (TRUEPLUS INSULIN SYRINGE) 31G X 5/16" 1 ML MISC Use daily as directed. Dispense needles as prescribed in the past. DX: E11.22 (Patient not taking: Reported on 10/25/2019)   isosorbide mononitrate (IMDUR) 60 MG 24 hr tablet Take  1 tablet (60 mg total) by mouth daily.   LANTUS 100 UNIT/ML injection Inject 20 Units into the skin at bedtime.   loperamide (IMODIUM) 2 MG capsule Take 1 capsule (2 mg total) by mouth every 4 (four) hours as needed for diarrhea or loose stools. (Patient not taking: Reported on 10/25/2019)   losartan (COZAAR) 100 MG tablet TAKE 1 TABLET BY MOUTH EVERY DAY (Patient taking differently: Take 100 mg by mouth daily. )   memantine (NAMENDA) 10 MG tablet TAKE 1 TABLET BY MOUTH TWICE A DAY (Patient taking differently: Take 10 mg by mouth in the morning and at bedtime. )   morphine (MSIR) 15 MG tablet Take 15 mg by mouth 2 (two) times daily as needed for severe pain.    nitroGLYCERIN (NITROSTAT) 0.4 MG SL tablet Place 1 tablet (0.4 mg total) under the tongue every 5 (five) minutes x 3 doses as needed for chest pain.   Omega-3 Fatty Acids (FISH OIL) 1000 MG CPDR Take 1,000 Units by mouth once a week.    selenium 50 MCG TABS tablet Take 50 mcg by mouth daily as needed (unknown).    sertraline (ZOLOFT) 50 MG tablet Take 1 tablet (50 mg total) by mouth at bedtime.   No facility-administered encounter medications on file as of 11/08/2019.     Eviana Sibilia Jenetta Downer, NP

## 2019-11-09 ENCOUNTER — Other Ambulatory Visit: Payer: Self-pay

## 2019-11-09 ENCOUNTER — Encounter
Admission: RE | Disposition: A | Discharge status: Home / Self Care | Payer: Self-pay | Source: Home / Self Care | Attending: Vascular Surgery

## 2019-11-09 ENCOUNTER — Ambulatory Visit
Admission: RE | Admit: 2019-11-09 | Discharge: 2019-11-09 | Disposition: A | Discharge status: Home / Self Care | Payer: Medicare Other | Attending: Vascular Surgery | Admitting: Vascular Surgery

## 2019-11-09 ENCOUNTER — Encounter (INDEPENDENT_AMBULATORY_CARE_PROVIDER_SITE_OTHER): Payer: Self-pay | Admitting: Nurse Practitioner

## 2019-11-09 ENCOUNTER — Ambulatory Visit: Payer: Medicare Other

## 2019-11-09 ENCOUNTER — Encounter (INDEPENDENT_AMBULATORY_CARE_PROVIDER_SITE_OTHER): Payer: Self-pay | Admitting: Vascular Surgery

## 2019-11-09 ENCOUNTER — Other Ambulatory Visit (INDEPENDENT_AMBULATORY_CARE_PROVIDER_SITE_OTHER): Payer: Self-pay | Admitting: Nurse Practitioner

## 2019-11-09 DIAGNOSIS — E1122 Type 2 diabetes mellitus with diabetic chronic kidney disease: Secondary | ICD-10-CM | POA: Insufficient documentation

## 2019-11-09 DIAGNOSIS — I129 Hypertensive chronic kidney disease with stage 1 through stage 4 chronic kidney disease, or unspecified chronic kidney disease: Secondary | ICD-10-CM | POA: Diagnosis not present

## 2019-11-09 DIAGNOSIS — L97519 Non-pressure chronic ulcer of other part of right foot with unspecified severity: Secondary | ICD-10-CM | POA: Diagnosis not present

## 2019-11-09 DIAGNOSIS — I2510 Atherosclerotic heart disease of native coronary artery without angina pectoris: Secondary | ICD-10-CM | POA: Insufficient documentation

## 2019-11-09 DIAGNOSIS — I70263 Atherosclerosis of native arteries of extremities with gangrene, bilateral legs: Secondary | ICD-10-CM | POA: Diagnosis not present

## 2019-11-09 DIAGNOSIS — Z955 Presence of coronary angioplasty implant and graft: Secondary | ICD-10-CM | POA: Insufficient documentation

## 2019-11-09 DIAGNOSIS — Z8546 Personal history of malignant neoplasm of prostate: Secondary | ICD-10-CM | POA: Diagnosis not present

## 2019-11-09 DIAGNOSIS — E782 Mixed hyperlipidemia: Secondary | ICD-10-CM | POA: Diagnosis not present

## 2019-11-09 DIAGNOSIS — E1151 Type 2 diabetes mellitus with diabetic peripheral angiopathy without gangrene: Secondary | ICD-10-CM | POA: Insufficient documentation

## 2019-11-09 DIAGNOSIS — K219 Gastro-esophageal reflux disease without esophagitis: Secondary | ICD-10-CM | POA: Diagnosis not present

## 2019-11-09 DIAGNOSIS — I70213 Atherosclerosis of native arteries of extremities with intermittent claudication, bilateral legs: Secondary | ICD-10-CM | POA: Insufficient documentation

## 2019-11-09 DIAGNOSIS — F0390 Unspecified dementia without behavioral disturbance: Secondary | ICD-10-CM | POA: Insufficient documentation

## 2019-11-09 DIAGNOSIS — D631 Anemia in chronic kidney disease: Secondary | ICD-10-CM | POA: Insufficient documentation

## 2019-11-09 DIAGNOSIS — I70299 Other atherosclerosis of native arteries of extremities, unspecified extremity: Secondary | ICD-10-CM

## 2019-11-09 DIAGNOSIS — I70261 Atherosclerosis of native arteries of extremities with gangrene, right leg: Secondary | ICD-10-CM | POA: Diagnosis not present

## 2019-11-09 DIAGNOSIS — Z87891 Personal history of nicotine dependence: Secondary | ICD-10-CM | POA: Insufficient documentation

## 2019-11-09 DIAGNOSIS — I252 Old myocardial infarction: Secondary | ICD-10-CM | POA: Diagnosis not present

## 2019-11-09 DIAGNOSIS — N1832 Chronic kidney disease, stage 3b: Secondary | ICD-10-CM | POA: Diagnosis not present

## 2019-11-09 DIAGNOSIS — M109 Gout, unspecified: Secondary | ICD-10-CM | POA: Insufficient documentation

## 2019-11-09 DIAGNOSIS — L97909 Non-pressure chronic ulcer of unspecified part of unspecified lower leg with unspecified severity: Secondary | ICD-10-CM

## 2019-11-09 HISTORY — PX: LOWER EXTREMITY ANGIOGRAPHY: CATH118251

## 2019-11-09 LAB — GLUCOSE, CAPILLARY
Glucose-Capillary: 202 mg/dL — ABNORMAL HIGH (ref 70–99)
Glucose-Capillary: 149 mg/dL — ABNORMAL HIGH (ref 70–99)

## 2019-11-09 LAB — CREATININE, SERUM
Creatinine, Ser: 1.41 mg/dL — ABNORMAL HIGH (ref 0.61–1.24)
GFR calc Af Amer: 52 mL/min — ABNORMAL LOW (ref >60)
GFR calc non Af Amer: 44 mL/min — ABNORMAL LOW (ref >60)

## 2019-11-09 LAB — BUN: BUN: 35 mg/dL — ABNORMAL HIGH (ref 8–23)

## 2019-11-09 SURGERY — LOWER EXTREMITY ANGIOGRAPHY
Anesthesia: Moderate Sedation | Site: Leg Lower | Laterality: Right

## 2019-11-09 MED ORDER — FENTANYL CITRATE (PF) 100 MCG/2ML IJ SOLN
INTRAMUSCULAR | Status: AC
  Filled 2019-11-09: qty 2

## 2019-11-09 MED ORDER — CLINDAMYCIN PHOSPHATE 300 MG/50ML IV SOLN: 300.0000 mg | Freq: Once | INTRAVENOUS | Status: AC

## 2019-11-09 MED ORDER — DIPHENHYDRAMINE HCL 50 MG/ML IJ SOLN: 50.0000 mg | Freq: Once | INTRAMUSCULAR | Status: DC | PRN

## 2019-11-09 MED ORDER — SODIUM CHLORIDE 0.9% FLUSH: 3.0000 mL | INTRAVENOUS | Status: DC | PRN

## 2019-11-09 MED ORDER — HYDRALAZINE HCL 20 MG/ML IJ SOLN: 5.0000 mg | INTRAMUSCULAR | Status: DC | PRN

## 2019-11-09 MED ORDER — MORPHINE SULFATE (PF) 4 MG/ML IV SOLN: 2.0000 mg | INTRAVENOUS | Status: DC | PRN

## 2019-11-09 MED ORDER — CLINDAMYCIN PHOSPHATE 300 MG/50ML IV SOLN
INTRAVENOUS | Status: AC
  Administered 2019-11-09: 300 mg via INTRAVENOUS
  Filled 2019-11-09: qty 50

## 2019-11-09 MED ORDER — FENTANYL CITRATE (PF) 100 MCG/2ML IJ SOLN
INTRAMUSCULAR | Status: DC | PRN
Start: 2019-11-09 — End: 2019-11-09
  Administered 2019-11-09: 100 ug via INTRAVENOUS

## 2019-11-09 MED ORDER — IODIXANOL 320 MG/ML IV SOLN
INTRAVENOUS | Status: DC | PRN
  Administered 2019-11-09: 40 mL via INTRA_ARTERIAL

## 2019-11-09 MED ORDER — MIDAZOLAM HCL 5 MG/5ML IJ SOLN
INTRAMUSCULAR | Status: AC
  Filled 2019-11-09: qty 5

## 2019-11-09 MED ORDER — METHYLPREDNISOLONE SODIUM SUCC 125 MG IJ SOLR: 125.0000 mg | Freq: Once | INTRAMUSCULAR | Status: DC | PRN

## 2019-11-09 MED ORDER — SODIUM CHLORIDE 0.9 % IV SOLN: 250.0000 mL | INTRAVENOUS | Status: DC | PRN

## 2019-11-09 MED ORDER — FAMOTIDINE 20 MG PO TABS: 40.0000 mg | ORAL_TABLET | Freq: Once | ORAL | Status: DC | PRN

## 2019-11-09 MED ORDER — ONDANSETRON HCL 4 MG/2ML IJ SOLN: 4.0000 mg | Freq: Four times a day (QID) | INTRAMUSCULAR | Status: DC | PRN

## 2019-11-09 MED ORDER — SODIUM CHLORIDE 0.9% FLUSH: 3.0000 mL | Freq: Two times a day (BID) | INTRAVENOUS | Status: DC

## 2019-11-09 MED ORDER — HYDROMORPHONE HCL 1 MG/ML IJ SOLN
1.0000 mg | Freq: Once | INTRAMUSCULAR | Status: AC | PRN
  Administered 2019-11-09: 0.5 mg via INTRAVENOUS

## 2019-11-09 MED ORDER — OXYCODONE HCL 5 MG PO TABS: 5.0000 mg | ORAL_TABLET | ORAL | Status: DC | PRN

## 2019-11-09 MED ORDER — SODIUM CHLORIDE 0.9 % IV SOLN: INTRAVENOUS | Status: DC

## 2019-11-09 MED ORDER — HEPARIN SODIUM (PORCINE) 1000 UNIT/ML IJ SOLN
INTRAMUSCULAR | Status: AC
  Filled 2019-11-09: qty 1

## 2019-11-09 MED ORDER — HYDROMORPHONE HCL 1 MG/ML IJ SOLN
INTRAMUSCULAR | Status: AC
  Filled 2019-11-09: qty 0.5

## 2019-11-09 MED ORDER — MIDAZOLAM HCL 2 MG/ML PO SYRP: 8.0000 mg | ORAL_SOLUTION | Freq: Once | ORAL | Status: DC | PRN

## 2019-11-09 MED ORDER — HEPARIN SODIUM (PORCINE) 1000 UNIT/ML IJ SOLN
INTRAMUSCULAR | Status: DC | PRN
  Administered 2019-11-09: 3000 [IU] via INTRAVENOUS

## 2019-11-09 MED ORDER — ACETAMINOPHEN 325 MG PO TABS: 650.0000 mg | ORAL_TABLET | ORAL | Status: DC | PRN

## 2019-11-09 MED ORDER — MIDAZOLAM HCL 2 MG/2ML IJ SOLN
INTRAMUSCULAR | Status: DC | PRN
  Administered 2019-11-09: 2 mg via INTRAVENOUS

## 2019-11-09 MED ORDER — LABETALOL HCL 5 MG/ML IV SOLN: 10.0000 mg | INTRAVENOUS | Status: DC | PRN

## 2019-11-09 SURGICAL SUPPLY — 16 items
CANNULA 5F STIFF (CANNULA) ×2 IMPLANT
CATH ANGIO 5F PIGTAIL 65CM (CATHETERS) ×2 IMPLANT
CATH BEACON 5 .035 65 KMP TIP (CATHETERS) ×2 IMPLANT
CATH BERNSTEIN 5FR 130CM (CATHETERS) ×2 IMPLANT
COVER PROBE U/S 5X48 (MISCELLANEOUS) ×2 IMPLANT
DEVICE STARCLOSE SE CLOSURE (Vascular Products) ×2 IMPLANT
GLIDEWIRE ADV .035X260CM (WIRE) ×2 IMPLANT
GUIDEWIRE SUPER STIFF .035X180 (WIRE) ×2 IMPLANT
NEEDLE ENTRY 21GA 7CM ECHOTIP (NEEDLE) ×2 IMPLANT
PACK ANGIOGRAPHY (CUSTOM PROCEDURE TRAY) ×2 IMPLANT
SET INTRO CAPELLA COAXIAL (SET/KITS/TRAYS/PACK) ×2 IMPLANT
SHEATH ANL 5FRX45 (SHEATH) ×2 IMPLANT
SHEATH BRITE TIP 5FRX11 (SHEATH) ×2 IMPLANT
SYR MEDRAD MARK 7 150ML (SYRINGE) ×2 IMPLANT
TUBING CONTRAST HIGH PRESS 72 (TUBING) ×4 IMPLANT
WIRE GUIDERIGHT .035X150 (WIRE) ×2 IMPLANT

## 2019-11-09 NOTE — Op Note (Signed)
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  Right lower extremity angiography third order catheter placement  2.  Ultrasound-guided access to left common femoral artery  3.  Star close closure to the left common femoral artery    Anesthesia: Conscious sedation was administered by the interventional radiology RN under my direct supervision. IV Versed plus fentanyl were utilized. Continuous ECG, pulse oximetry and blood pressure was monitored throughout the entire procedure.  Conscious sedation was administered for a total of 42 minutes.  Sheath: 5 [place] Ansell left common femoral retrograde  Contrast: 40 cc   Fluoroscopy Time: 7.2 minutes  Indications:  The patient presents to [facility] with gangrenous changes to the right foot.  Pedal pulses are nonpalpable bilaterally suggesting atherosclerotic occlusive disease.  The risks and benefits as well as alternative therapies for lower extremity revascularization are reviewed with the patient all questions are answered the patient agrees to proceed.  The patient is therefore undergoing angiography with the hope for intervention for limb salvage.   Procedure:  Antowan Samford a 86 y.o. male who was identified and appropriate procedural time out was performed.  The patient was then placed supine on the table and prepped and draped in the usual sterile fashion.  Ultrasound was used to evaluate the left common femoral artery.  It was echolucent and pulsatile indicating it is patent .  An ultrasound image was acquired for the permanent record.  A micropuncture needle was  used to access the left common femoral artery under direct ultrasound guidance.  The microwire was then advanced under fluoroscopic guidance without difficulty followed by the micro-sheath.  A 0.035 J wire was advanced without resistance and a 5Fr sheath was placed.    Pigtail catheter was positioned to above the bifurcation and LAO view of the pelvis was obtained. Stiff angled Glidewire and pigtail catheter was then used across the bifurcation and the catheter was positioned in the distal external iliac artery.  RAO of the right groin was then obtained. Wire was reintroduced and attempts at negotiating the wire into the SFA were not successful.  With the advantage wire positioned into the profunda femoris the 5 French Pinnacle sheath was removed and a 5 [place] Ansell sheath was advanced up and over the bifurcation and positioned with the tip in the mid common femoral on the right.  The advantage wire and a Kumpe catheter was advanced into the previous stents within the SFA.  Wire and catheter were then negotiated down through the entire length of the SFA and popliteal and positioned at the approximate location of the distal popliteal tibioperoneal trunk.  Hand-injection of contrast was then used to perform distal runoff.  After review of the images the catheter was removed over wire and an LAO view of the groin was obtained. StarClose device was deployed without difficulty.   Findings:   Aortogram: The infrarenal aorta is again opacified the bolus injection contrast.  There is no hemodynamically significant stenosis noted.  The aortic bifurcation is widely patent in the common iliac arteries are widely patent.  The iliac arteries particularly the external iliac arteries are severely tortuous but widely patent.  Magnified imaging of the mid external iliac artery  in several different oblique views was performed there is there was one area that appeared to have a possible stenosis.  However in 3 different  views this region appeared to have less than 20% narrowing and was not hemodynamically significant.  Right Lower Extremity: The right common femoral demonstrates moderate plaque up to the femoral bifurcation where there appears to be a greater than 60% stenosis at the origin of the profunda on the right.  The origin of the SFA appears to have a greater than 60% narrowing as well.  The leading edge of the SFA stent is located approximately 1 cm below the true origin.  The SFA is thrombosed up to the top of the stent and the stented segment remains thrombosed all the way down to the peroneal.  From a femoral injection there is no evidence of a patent tibial runoff.  Having negotiated a catheter into the SFA was then negotiated down through the entire stented segment.  The distal popliteal and tibioperoneal trunk appear occluded.  The peroneal does reconstitute near its origin.  Is widely patent down to the foot with extensive collaterals particularly filling the dorsalis pedis and the pedal arch.  It appears to be of good caliber throughout its course.  There is nonvisualization of the posterior tibial artery in its entirety as well as the plantar arteries.  There is nonvisualization of the anterior tibial from its origin down to the level of the ankle where again as noted the distalmost anterior tibial/dorsalis pedis is reconstituted by rather exuberant peroneal collaterals.  Left Lower Extremity: The left common femoral demonstrates a greater than 50% narrowing in its mid midportion.   Disposition: Patient was taken to the recovery room in stable condition having tolerated the procedure well.  Belenda Cruise Idus Rathke 11/09/2019,5:51 PM

## 2019-11-09 NOTE — Progress Notes (Signed)
Pt. Med. With Dilaudid 0.5 mg slow IVP for c/o "9"/10 pain to right toes (necrotic). Report given to V. Nicki Reaper, RN to assume care of pt. Remains NPO for procedure today.

## 2019-11-09 NOTE — Discharge Instructions (Signed)
Femoral Site Care This sheet gives you information about how to care for yourself after your procedure. Your health care provider may also give you more specific instructions. If you have problems or questions, contact your health care provider. What can I expect after the procedure? After the procedure, it is common to have:  Bruising that usually fades within 1-2 weeks.  Tenderness at the site. Follow these instructions at home: Wound care  Follow instructions from your health care provider about how to take care of your insertion site. Make sure you: ? Wash your hands with soap and water before you change your bandage (dressing). If soap and water are not available, use hand sanitizer. ? Change your dressing as told by your health care provider. ? Leave stitches (sutures), skin glue, or adhesive strips in place. These skin closures may need to stay in place for 2 weeks or longer. If adhesive strip edges start to loosen and curl up, you may trim the loose edges. Do not remove adhesive strips completely unless your health care provider tells you to do that.  Do not take baths, swim, or use a hot tub until your health care provider approves.  You may shower 24-48 hours after the procedure or as told by your health care provider. ? Gently wash the site with plain soap and water. ? Pat the area dry with a clean towel. ? Do not rub the site. This may cause bleeding.  Do not apply powder or lotion to the site. Keep the site clean and dry.  Check your femoral site every day for signs of infection. Check for: ? Redness, swelling, or pain. ? Fluid or blood. ? Warmth. ? Pus or a bad smell. Activity  For the first 2-3 days after your procedure, or as long as directed: ? Avoid climbing stairs as much as possible. ? Do not squat.  Do not lift anything that is heavier than 10 lb (4.5 kg), or the limit that you are told, until your health care provider says that it is safe.  Rest as  directed. ? Avoid sitting for a long time without moving. Get up to take short walks every 1-2 hours.  Do not drive for 24 hours if you were given a medicine to help you relax (sedative). General instructions  Take over-the-counter and prescription medicines only as told by your health care provider.  Keep all follow-up visits as told by your health care provider. This is important. Contact a health care provider if you have:  A fever or chills.  You have redness, swelling, or pain around your insertion site. Get help right away if:  The catheter insertion area swells very fast.  You pass out.  You suddenly start to sweat or your skin gets clammy.  The catheter insertion area is bleeding, and the bleeding does not stop when you hold steady pressure on the area.  The area near or just beyond the catheter insertion site becomes pale, cool, tingly, or numb. These symptoms may represent a serious problem that is an emergency. Do not wait to see if the symptoms will go away. Get medical help right away. Call your local emergency services (911 in the [place] Do not drive yourself to the hospital. Summary  After the procedure, it is common to have bruising that usually fades within 1-2 weeks.  Check your femoral site every day for signs of infection.  Do not lift anything that is heavier than 10 lb (4.5 kg), or the   limit that you are told, until your health care provider says that it is safe. This information is not intended to replace advice given to you by your health care provider. Make sure you discuss any questions you have with your health care provider. Document Revised: 02/10/2017 Document Reviewed: 02/10/2017 Elsevier Patient Education  2020 Else[employer]. Moderate Conscious Sedation, Adult, Care After These instructions provide you with information about caring for yourself after your procedure. Your health care provider may also give you more specific instructions. Your  treatment has been planned according to current medical practices, but problems sometimes occur. Call your health care provider if you have any problems or questions after your procedure. What can I expect after the procedure? After your procedure, it is common:  To feel sleepy for several hours.  To feel clumsy and have poor balance for several hours.  To have poor judgment for several hours.  To vomit if you eat too soon. Follow these instructions at home: For at least 24 hours after the procedure:   Do not: ? Participate in activities where you could fall or become injured. ? Drive. ? Use heavy machinery. ? Drink alcohol. ? Take sleeping pills or medicines that cause drowsiness. ? Make important decisions or sign legal documents. ? Take care of children on your own.  Rest. Eating and drinking  Follow the diet recommended by your health care provider.  If you vomit: ? Drink water, juice, or soup when you can drink without vomiting. ? Make sure you have little or no nausea before eating solid foods. General instructions  Have a responsible adult stay with you until you are awake and alert.  Take over-the-counter and prescription medicines only as told by your health care provider.  If you smoke, do not smoke without supervision.  Keep all follow-up visits as told by your health care provider. This is important. Contact a health care provider if:  You keep feeling nauseous or you keep vomiting.  You feel light-headed.  You develop a rash.  You have a fever. Get help right away if:  You have trouble breathing. This information is not intended to replace advice given to you by your health care provider. Make sure you discuss any questions you have with your health care provider. Document Revised: 01/10/2017 Document Reviewed: 05/20/2015 Elsevier Patient Education  2020 Else[employer] Inc. Angiogram, Care After This sheet gives you information about how to care for  yourself after your procedure. Your doctor may also give you more specific instructions. If you have problems or questions, contact your doctor. Follow these instructions at home: Insertion site care  Follow instructions from your doctor about how to take care of your long, thin tube (catheter) insertion area. Make sure you: ? Wash your hands with soap and water before you change your bandage (dressing). If you cannot use soap and water, use hand sanitizer. ? Change your bandage as told by your doctor. ? Leave stitches (sutures), skin glue, or skin tape (adhesive) strips in place. They may need to stay in place for 2 weeks or longer. If tape strips get loose and curl up, you may trim the loose edges. Do not remove tape strips completely unless your doctor says it is okay.  Do not take baths, swim, or use a hot tub until your doctor says it is okay.  You may shower 24-48 hours after the procedure or as told by your doctor. ? Gently wash the area with plain soap and water. ?   Pat the area dry with a clean towel. ? Do not rub the area. This may cause bleeding.  Do not apply powder or lotion to the area. Keep the area clean and dry.  Check your insertion area every day for signs of infection. Check for: ? More redness, swelling, or pain. ? Fluid or blood. ? Warmth. ? Pus or a bad smell. Activity  Rest as told by your doctor, usually for 1-2 days.  Do not lift anything that is heavier than 10 lbs. (4.5 kg) or as told by your doctor.  Do not drive for 24 hours if you were given a medicine to help you relax (sedative).  Do not drive or use heavy machinery while taking prescription pain medicine. General instructions   Go back to your normal activities as told by your doctor, usually in about a week. Ask your doctor what activities are safe for you.  If the insertion area starts to bleed, lie flat and put pressure on the area. If the bleeding does not stop, get help right away. This is an  emergency.  Drink enough fluid to keep your pee (urine) clear or pale yellow.  Take over-the-counter and prescription medicines only as told by your doctor.  Keep all follow-up visits as told by your doctor. This is important. Contact a doctor if:  You have a fever.  You have chills.  You have more redness, swelling, or pain around your insertion area.  You have fluid or blood coming from your insertion area.  The insertion area feels warm to the touch.  You have pus or a bad smell coming from your insertion area.  You have more bruising around the insertion area.  Blood collects in the tissue around the insertion area (hematoma) that may be painful to the touch. Get help right away if:  You have a lot of pain in the insertion area.  The insertion area swells very fast.  The insertion area is bleeding, and the bleeding does not stop after holding steady pressure on the area.  The area near or just beyond the insertion area becomes pale, cool, tingly, or numb. These symptoms may be an emergency. Do not wait to see if the symptoms will go away. Get medical help right away. Call your local emergency services (911 in the [place] Do not drive yourself to the hospital. Summary  After the procedure, it is common to have bruising and tenderness at the long, thin tube insertion area.  After the procedure, it is important to rest and drink plenty of fluids.  Do not take baths, swim, or use a hot tub until your doctor says it is okay to do so. You may shower 24-48 hours after the procedure or as told by your doctor.  If the insertion area starts to bleed, lie flat and put pressure on the area. If the bleeding does not stop, get help right away. This is an emergency. This information is not intended to replace advice given to you by your health care provider. Make sure you discuss any questions you have with your health care provider. Document Revised: 01/10/2017 Document Reviewed:  01/23/2016 Elsevier Patient Education  2020 Elsevier Inc.  

## 2019-11-09 NOTE — Progress Notes (Signed)
I spoke with his daughter by phone after the procedure.  I described the findings of the angiogram.  Essentially his aorta iliac inflow is quite good his common femoral has moderate mid disease and what appears to be significant ostial stenosis of both the profunda femoris as well as the SFA.  Distal to this the SFA and popliteal are occluded from the leading edge of the stent down to the distal popliteal and likely including the tibioperoneal trunk.  He does have single-vessel runoff to the foot via the peroneal which is a good quality vessel reasonable caliber and extensively collateralizes distally filling the dorsalis pedis and forefoot.  He most certainly is a candidate for a femoral to peroneal bypass this would likely be with PTFE.  At that time the ostial lesion of the profunda femoris could also be addressed to maximize collateral flow.  We discussed the next step provided his pain is reasonably well-controlled is cardiac clearance and I will contact Dr. Saunders Revel regarding this.  As a second complicating issue he does have anemia for which he is undergoing iron transfusions and is currently being worked up by Dr. Vicente Males, I believe an upper GI as well as a colonoscopy have been planned but I will check with him regarding this.  At the present time given that his peripheral stents are all occluded he is certainly okay to be off Plavix and aspirin from the vascular standpoint.

## 2019-11-09 NOTE — Progress Notes (Signed)
Pt. States he drank 4 oz. Juice and 4 bites of Cheerios. Vascular team made aware : they will tell Dr. Delana Meyer. Await word as to whether case will be done or not.

## 2019-11-09 NOTE — Interval H&P Note (Signed)
History and Physical Interval Note:  11/09/2019 4:49 PM  Peter [name]  has presented today for surgery, with the diagnosis of RT lower extremity angio   BARD   ASO w ulceration Covid Sept 24.  The various methods of treatment have been discussed with the patient and family. After consideration of risks, benefits and other options for treatment, the patient has consented to  Procedure(s): LOWER EXTREMITY ANGIOGRAPHY (Right) as a surgical intervention.  The patient's history has been reviewed, patient examined, no change in status, stable for surgery.  I have reviewed the patient's chart and labs.  Questions were answered to the patient's satisfaction.     [name]

## 2019-11-09 NOTE — Progress Notes (Signed)
Pt. Wife unable to drive pt. Home (uses nurses aides). Called and spoke with Pt. Daughter [name]: she is willing to pick pt. Up this evening . Case delayed until 3 pm : family aware and agreeable to procede later today with LE angiogram.

## 2019-11-10 ENCOUNTER — Encounter: Payer: Self-pay | Admitting: Vascular Surgery

## 2019-11-10 ENCOUNTER — Telehealth: Payer: Self-pay | Admitting: Adult Health Nurse Practitioner

## 2019-11-10 DIAGNOSIS — R29810 Facial weakness: Secondary | ICD-10-CM | POA: Diagnosis not present

## 2019-11-10 DIAGNOSIS — R531 Weakness: Secondary | ICD-10-CM | POA: Diagnosis not present

## 2019-11-10 DIAGNOSIS — G459 Transient cerebral ischemic attack, unspecified: Secondary | ICD-10-CM | POA: Diagnosis not present

## 2019-11-10 DIAGNOSIS — R4781 Slurred speech: Secondary | ICD-10-CM | POA: Diagnosis not present

## 2019-11-10 DIAGNOSIS — I959 Hypotension, unspecified: Secondary | ICD-10-CM | POA: Diagnosis not present

## 2019-11-10 NOTE — Telephone Encounter (Signed)
Spoke with wife about effectiveness of oxycodone for his pain.  States that he was in hospital all day yesterday for procedure and really has not had a chance to try it.  Will call again on Friday and do have appointment next week for follow up Peter [name] K. Olena Heckle NP

## 2019-11-11 ENCOUNTER — Ambulatory Visit: Payer: Medicare Other

## 2019-11-11 ENCOUNTER — Other Ambulatory Visit: Payer: Self-pay

## 2019-11-11 DIAGNOSIS — D649 Anemia, unspecified: Secondary | ICD-10-CM

## 2019-11-11 MED ORDER — NA SULFATE-K SULFATE-MG SULF 17.5-3.13-1.6 GM/177ML PO SOLN: 1.0000 | Freq: Once | ORAL | 0 refills | Status: AC

## 2019-11-12 ENCOUNTER — Telehealth: Payer: Self-pay | Admitting: Adult Health Nurse Practitioner

## 2019-11-12 ENCOUNTER — Other Ambulatory Visit: Payer: Self-pay

## 2019-11-12 ENCOUNTER — Inpatient Hospital Stay: Payer: Medicare Other | Attending: Oncology

## 2019-11-12 VITALS — BP 146/65 | HR 61 | Temp 97.3°F | Resp 18

## 2019-11-12 DIAGNOSIS — N1832 Chronic kidney disease, stage 3b: Secondary | ICD-10-CM

## 2019-11-12 DIAGNOSIS — D509 Iron deficiency anemia, unspecified: Secondary | ICD-10-CM | POA: Diagnosis not present

## 2019-11-12 MED ORDER — SODIUM CHLORIDE 0.9 % IV SOLN
Freq: Once | INTRAVENOUS | Status: AC
  Filled 2019-11-12: qty 250

## 2019-11-12 MED ORDER — SODIUM CHLORIDE 0.9 % IV SOLN: 200.0000 mg | Freq: Once | INTRAVENOUS | Status: DC

## 2019-11-12 MED ORDER — IRON SUCROSE 20 MG/ML IV SOLN
200.0000 mg | Freq: Once | INTRAVENOUS | Status: AC
  Administered 2019-11-12: 200 mg via INTRAVENOUS
  Filled 2019-11-12: qty 10

## 2019-11-12 NOTE — Telephone Encounter (Signed)
Spoke with wife to check the effectiveness of switch to oxycodone 5 mg every 4 hours as needed.  States uncertain if this is working.  Did state that if needed over the weekend he can try 2 tabs every 4 hours as needed for pain.  Wife expressed understanding. Wife left a message later stating that he was sleepier and that they were going to stick to just 1 tab every 4 hours.  Have appointment on Monday to reevaluate further. Encouraged to call with any questions or concerns Princetta Uplinger K. Olena Heckle, NP

## 2019-11-15 ENCOUNTER — Other Ambulatory Visit: Payer: Medicare Other | Admitting: Adult Health Nurse Practitioner

## 2019-11-15 ENCOUNTER — Other Ambulatory Visit: Payer: Self-pay

## 2019-11-15 ENCOUNTER — Encounter (INDEPENDENT_AMBULATORY_CARE_PROVIDER_SITE_OTHER): Payer: Self-pay | Admitting: Vascular Surgery

## 2019-11-15 ENCOUNTER — Ambulatory Visit (INDEPENDENT_AMBULATORY_CARE_PROVIDER_SITE_OTHER): Payer: Medicare Other | Admitting: Vascular Surgery

## 2019-11-15 VITALS — BP 135/51 | HR 50 | Resp 16

## 2019-11-15 DIAGNOSIS — I639 Cerebral infarction, unspecified: Secondary | ICD-10-CM

## 2019-11-15 DIAGNOSIS — I2510 Atherosclerotic heart disease of native coronary artery without angina pectoris: Secondary | ICD-10-CM

## 2019-11-15 DIAGNOSIS — I70263 Atherosclerosis of native arteries of extremities with gangrene, bilateral legs: Secondary | ICD-10-CM

## 2019-11-15 DIAGNOSIS — I200 Unstable angina: Secondary | ICD-10-CM | POA: Diagnosis not present

## 2019-11-15 DIAGNOSIS — G8929 Other chronic pain: Secondary | ICD-10-CM | POA: Diagnosis not present

## 2019-11-15 DIAGNOSIS — E782 Mixed hyperlipidemia: Secondary | ICD-10-CM | POA: Diagnosis not present

## 2019-11-15 DIAGNOSIS — I10 Essential (primary) hypertension: Secondary | ICD-10-CM

## 2019-11-15 DIAGNOSIS — Z515 Encounter for palliative care: Secondary | ICD-10-CM

## 2019-11-15 DIAGNOSIS — M25561 Pain in right knee: Secondary | ICD-10-CM

## 2019-11-15 DIAGNOSIS — M545 Low back pain, unspecified: Secondary | ICD-10-CM

## 2019-11-15 NOTE — Progress Notes (Signed)
[facility] Consult Note Telephone: [phone]  Fax: [phone]  PATIENT NAME: Peter [name] DOB: 11-27-1930 MRN: [id number]  PRIMARY CARE PROVIDER:   Lesleigh Noe, MD  REFERRING PROVIDER:  Lesleigh Noe, MD [address],  [place] [address]  RESPONSIBLE PARTY:   Peter [name], wife H: [phone] C: [place], daughter [phone]   RECOMMENDATIONS and PLAN: 1.Advanced care planning. Patient is DNR. Advanced directives uploaded to Epic  2.  Pain.  Patient tried oxycodone 5 mg Q4 hrs PRN without any relief. Still states that pain is 9/10. Family does state that it does make him drowsy.  He has not had a fall over the past week.  Family does state that the day after he had an angiogram he did have an incident in which he was really drowsy and hard to get off the toilet.  He was evaluated by EMS and felt that he may have possibly had a TIA.  He has not had any episodes since.  Discussed options to try for pain relief.  Family do state that he does not sleep well and inquiring if he could take the morphine at night as he did sleep well when on it.  He does endorse that he gets up many times throughout the night to go to the bathroom and discussed concerns that this will increase his risk of falls.  Discussed OTC options such as capsaicin cream and CBD cream.  Discussed extended relief options of oxycodone.  He would like to try the extended relief oxycodone.  eprescribed Xtampza 9 mg Q12 hours and instructed to stop if made him drowsy.  Patient and family expressed understanding.  Will call in 2 days to follow up and have follow up appointment in 1 week.   I spent 40 minutes providing this consultation,  from 4:30 to 5:10 including time spent with patient/family, chart review, provider coordination, documentation. More than 50% of the time in this consultation was spent coordinating communication.   HISTORY  OF PRESENT ILLNESS:  Peter [name] is a 86 y.o. year old male with multiple medical problems including chronic pain in back and right leg, anemia, CKD stage 3, HTN, HLD, PAD, mild dementia. Palliative Care was asked to help address goals of care.   CODE STATUS: DNR  PPS: 60% HOSPICE ELIGIBILITY/DIAGNOSIS: TBD  PHYSICAL EXAM:   General: NAD, frail appearing,Extremities: no edema, no joint deformities Skin: no rashes on exposed skin Neurological: Weakness; A&O x3 but has forgetfulness and will repeat himself  PAST MEDICAL HISTORY:  Past Medical History:  Diagnosis Date  . Allergy   . Anemia due to stage 3b chronic kidney disease ([place]) 10/25/2019  . Arthritis   . Carpal tunnel syndrome   . CKD (chronic kidney disease), stage III ([place])   . Coronary artery disease    a. 03/2018 PCI [facility]): RCA 95p, 78m (2.5x38 [place] DES); b. 06/2019 PCI: LM 40ost, LAD 85p, 60p/m (atherectomy w/ 3.0x48 Synergy DES p/m LAD), 16m, LCX 50d, OM2/3 50, RCA patent prox/mid stent, 50d, RPDA 50.  . Diabetes mellitus without complication ([place])   . Diastolic dysfunction    a. 06/2019 Echo: EF 60-65%, no rwma, mod LVH, Gr1 DD. Nl RV fxn. Mildly dil LA. Mild-mod TR.  . Diverticulitis   . GERD (gastroesophageal reflux disease)   . Gout   . History of blood transfusion   . History of chicken pox   . Hyperlipidemia   .  Hypertension   . Mild dementia ([place])   . Myocardial infarction ([place]) 03/2018  . Normocytic anemia    a. 06/2019 s/p 1u PRBCs.  [name] PAD (peripheral artery disease) ([place])    a. 11/2018 s/p R SFA, popliteal, and peroneal artery PTA.  . Prostate cancer [facility])    prostate  . Prostate cancer ([place])   . Syncope     SOCIAL HX:  Social History   Tobacco Use  . Smoking status: Former Smoker    Packs/day: 0.75    Years: 12.00    Pack years: 9.00    Types: Cigars, Cigarettes    Quit date: 1970    Years since quitting: 51.7  . Smokeless tobacco: Former Systems developer    Types: [place]  date: 2016  Substance Use Topics  . Alcohol use: Yes    Comment: occasional    ALLERGIES:  Allergies  Allergen Reactions  . Penicillins Anaphylaxis    Reported airway compromise 50 years     PERTINENT MEDICATIONS:  Outpatient Encounter Medications as of 11/15/2019  Medication Sig  . alendronate (FOSAMAX) 70 MG tablet Take 70 mg by mouth every Saturday.   [name] allopurinol (ZYLOPRIM) 100 MG tablet TAKE 1 TABLET BY MOUTH EVERY DAY (Patient taking differently: Take 100 mg by mouth daily. )  . amLODipine (NORVASC) 10 MG tablet TAKE 1 TABLET BY MOUTH EVERY DAY (Patient taking differently: Take 10 mg by mouth daily. )  . Ascorbic Acid (VITAMIN C) 1000 MG tablet Take 1,000 mg by mouth once a week.   [name] aspirin EC 81 MG tablet Take 81 mg by mouth every evening.   [name] atorvastatin (LIPITOR) 80 MG tablet Take 1 tablet (80 mg total) by mouth every evening. (Patient taking differently: Take 40 mg by mouth every evening. )  . beta carotene 25000 UNIT capsule Take 25,000 Units by mouth once a week.   . carvedilol (COREG) 3.125 MG tablet Take 1 tablet (3.125 mg total) by mouth 2 (two) times daily with a meal.  . clopidogrel (PLAVIX) 75 MG tablet TAKE 1 TABLET BY MOUTH EVERY DAY (Patient taking differently: Take 75 mg by mouth daily. )  . donepezil (ARICEPT) 10 MG tablet Take 10 mg by mouth daily.   [name] doxazosin (CARDURA) 4 MG tablet Take 1 tablet (4 mg total) by mouth at bedtime. (Patient taking differently: Take 2 mg by mouth at bedtime. )  . ferrous sulfate 325 (65 FE) MG tablet TAKE 1 TABLET BY MOUTH EVERY DAY (Patient taking differently: Take 325 mg by mouth daily. )  . fidaxomicin (DIFICID) 200 MG TABS tablet Take 200 mg by mouth 2 (two) times daily.  . finasteride (PROSCAR) 5 MG tablet Take 1 tablet (5 mg total) by mouth daily.  . Flaxseed, Linseed, (FLAX SEED OIL) 1000 MG CAPS Take 1,000 mg by mouth once a week.   . folic acid (FOLVITE) 967 MCG tablet Take 400 mcg by mouth daily as needed (unsure).     . gabapentin (NEURONTIN) 300 MG capsule Take 1 capsule (300 mg total) by mouth 2 (two) times daily. (Patient taking differently: Take 300 mg by mouth 3 (three) times daily. )  . hydrALAZINE (APRESOLINE) 50 MG tablet TAKE 1 TABLET BY MOUTH EVERY 8 HOURS. (Patient taking differently: Take 50 mg by mouth 3 (three) times daily. )  . hydrochlorothiazide (HYDRODIURIL) 12.5 MG tablet Take 12.5 mg by mouth at bedtime.   . Insulin Syringe-Needle U-100 (TRUEPLUS INSULIN SYRINGE) 31G X 5/16" 1  ML MISC Use daily as directed. Dispense needles as prescribed in the past. DX: E11.22 (Patient not taking: Reported on 10/25/2019)  . isosorbide mononitrate (IMDUR) 60 MG 24 hr tablet Take 1 tablet (60 mg total) by mouth daily.  [name] LANTUS 100 UNIT/ML injection Inject 20 Units into the skin at bedtime.  [name] loperamide (IMODIUM) 2 MG capsule Take 1 capsule (2 mg total) by mouth every 4 (four) hours as needed for diarrhea or loose stools. (Patient not taking: Reported on 10/25/2019)  . losartan (COZAAR) 100 MG tablet TAKE 1 TABLET BY MOUTH EVERY DAY (Patient taking differently: Take 100 mg by mouth daily. )  . memantine (NAMENDA) 10 MG tablet TAKE 1 TABLET BY MOUTH TWICE A DAY (Patient taking differently: Take 10 mg by mouth in the morning and at bedtime. )  . morphine (MSIR) 15 MG tablet Take 15 mg by mouth 2 (two) times daily as needed for severe pain.   . nitroGLYCERIN (NITROSTAT) 0.4 MG SL tablet Place 1 tablet (0.4 mg total) under the tongue every 5 (five) minutes x 3 doses as needed for chest pain. (Patient not taking: Reported on 11/09/2019)  . Omega-3 Fatty Acids (FISH OIL) 1000 MG CPDR Take 1,000 Units by mouth once a week.   . selenium 50 MCG TABS tablet Take 50 mcg by mouth daily as needed (unknown).   . sertraline (ZOLOFT) 50 MG tablet Take 1 tablet (50 mg total) by mouth at bedtime.   No facility-administered encounter medications on file as of 11/15/2019.     Shelbia Scinto Jenetta Downer, NP

## 2019-11-16 ENCOUNTER — Encounter (INDEPENDENT_AMBULATORY_CARE_PROVIDER_SITE_OTHER): Payer: Self-pay | Admitting: Vascular Surgery

## 2019-11-16 ENCOUNTER — Ambulatory Visit: Payer: Medicare Other
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
## 2019-11-17 ENCOUNTER — Other Ambulatory Visit: Payer: Self-pay

## 2019-11-17 ENCOUNTER — Encounter: Payer: Self-pay | Admitting: Family

## 2019-11-17 ENCOUNTER — Ambulatory Visit (INDEPENDENT_AMBULATORY_CARE_PROVIDER_SITE_OTHER): Payer: Medicare Other | Admitting: Family

## 2019-11-17 VITALS — BP 124/42 | HR 41 | Ht 68.0 in | Wt 165.0 lb

## 2019-11-17 DIAGNOSIS — N183 Chronic kidney disease, stage 3 unspecified: Secondary | ICD-10-CM | POA: Diagnosis not present

## 2019-11-17 DIAGNOSIS — I10 Essential (primary) hypertension: Secondary | ICD-10-CM | POA: Diagnosis not present

## 2019-11-17 DIAGNOSIS — E785 Hyperlipidemia, unspecified: Secondary | ICD-10-CM

## 2019-11-17 DIAGNOSIS — R55 Syncope and collapse: Secondary | ICD-10-CM | POA: Diagnosis not present

## 2019-11-17 DIAGNOSIS — I25118 Atherosclerotic heart disease of native coronary artery with other forms of angina pectoris: Secondary | ICD-10-CM

## 2019-11-17 NOTE — Patient Instructions (Addendum)
Medication Instructions:  Your physician has recommended you make the following change in your medication:   STOP Carvedilol (Coreg)  You may HOLD Plavix 5 days prior to your colonoscopy. We will route these recommendations to GI.  *If you need a refill on your cardiac medications before your next appointment, please call your pharmacy*   Lab Work: None ordered today.   Testing/Procedures: Your EKG today showed sinus bradycardia which is a slow but regular heart rhythm. We have stopped your Carvedilol (Coreg) to help get your heart rate up a bit higher as bradycardia can make you tired. Please call our office if your heart rate is consistently less than 55 bpm.   Follow-Up: At [facility], you and your health needs are our priority.  As part of our continuing mission to provide you with exceptional heart care, we have created designated Provider Care Teams.  These Care Teams include your primary Cardiologist (physician) and Advanced Practice Providers (APPs -  Physician Assistants and Nurse Practitioners) who all work together to provide you with the care you need, when you need it.  We recommend signing up for the patient portal called "MyChart".  Sign up information is provided on this After Visit Summary.  MyChart is used to connect with patients for Virtual Visits (Telemedicine).  Patients are able to view lab/test results, encounter notes, upcoming appointments, etc.  Non-urgent messages can be sent to your provider as well.   To learn more about what you can do with MyChart, go to [email or web address].    Your next appointment:   3 month(s)  The format for your next appointment:   In Person  Provider:   You may see [name], MD or one of the following Advanced Practice Providers on your designated Care Team:    Murray Hodgkins, NP  Christell Faith, PA-C  Laurann Montana, NP  Marrianne Mood, PA-C  Cadence Kathlen Mody, [place]  Other Instructions  Call our office if BP  consistently >130/80. Please check at least 1 hour after medications.   Call our office if HR consistently <55 bpm.
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
## 2019-11-18 ENCOUNTER — Ambulatory Visit: Payer: Medicare Other

## 2019-11-18 ENCOUNTER — Encounter: Payer: Self-pay | Admitting: Family

## 2019-11-19 DIAGNOSIS — D509 Iron deficiency anemia, unspecified: Secondary | ICD-10-CM | POA: Diagnosis not present

## 2019-11-19 DIAGNOSIS — M545 Low back pain: Secondary | ICD-10-CM | POA: Diagnosis not present

## 2019-11-19 DIAGNOSIS — F329 Major depressive disorder, single episode, unspecified: Secondary | ICD-10-CM | POA: Diagnosis not present

## 2019-11-19 DIAGNOSIS — I10 Essential (primary) hypertension: Secondary | ICD-10-CM | POA: Diagnosis not present

## 2019-11-19 DIAGNOSIS — G894 Chronic pain syndrome: Secondary | ICD-10-CM | POA: Diagnosis not present

## 2019-11-19 DIAGNOSIS — E1142 Type 2 diabetes mellitus with diabetic polyneuropathy: Secondary | ICD-10-CM | POA: Diagnosis not present

## 2019-11-22 ENCOUNTER — Other Ambulatory Visit: Payer: Medicare Other | Admitting: Adult Health Nurse Practitioner

## 2019-11-22 ENCOUNTER — Other Ambulatory Visit: Payer: Self-pay

## 2019-11-22 DIAGNOSIS — M545 Low back pain, unspecified: Secondary | ICD-10-CM | POA: Diagnosis not present

## 2019-11-22 DIAGNOSIS — G894 Chronic pain syndrome: Secondary | ICD-10-CM | POA: Diagnosis not present

## 2019-11-22 DIAGNOSIS — Z515 Encounter for palliative care: Secondary | ICD-10-CM

## 2019-11-22 DIAGNOSIS — D509 Iron deficiency anemia, unspecified: Secondary | ICD-10-CM | POA: Diagnosis not present

## 2019-11-22 DIAGNOSIS — G8929 Other chronic pain: Secondary | ICD-10-CM

## 2019-11-22 DIAGNOSIS — M25561 Pain in right knee: Secondary | ICD-10-CM | POA: Diagnosis not present

## 2019-11-22 DIAGNOSIS — E1142 Type 2 diabetes mellitus with diabetic polyneuropathy: Secondary | ICD-10-CM | POA: Diagnosis not present

## 2019-11-22 DIAGNOSIS — F329 Major depressive disorder, single episode, unspecified: Secondary | ICD-10-CM | POA: Diagnosis not present

## 2019-11-22 DIAGNOSIS — I10 Essential (primary) hypertension: Secondary | ICD-10-CM | POA: Diagnosis not present

## 2019-11-22 NOTE — Progress Notes (Addendum)
[facility] Consult Note Telephone: [phone]  Fax: [phone]  PATIENT NAME: Peter [name] DOB: 05-24-1930 MRN: [id number]  PRIMARY CARE PROVIDER:   Lesleigh Noe, MD  REFERRING PROVIDER:  Lesleigh Noe, MD [address],  [place] [address]  RESPONSIBLE PARTY:  Jaquel Glassburn, wife H: [phone] C: Streator, daughter [phone]   RECOMMENDATIONS and PLAN: 1.Advanced care planning. Patient is DNR. Advanced directives uploaded to Epic  2. Pain.  Patient continues to have not pain relief with oxycodone.  He gets drowsy with the oxycodone.  When tried the morphine this made him drowsy and "loopy."  Will not try anything else as anything else such as another opioid or muscle relaxer will just make him drowsy and increase risk of falls.  Patient is staying in bed more and using his wheelchair more often.  He continues to work with PT. Caregiver does state that he is less drowsy with the oxycodone.  Patient does not want to refill oxycodone or try anything else at this time as he feels it makes him sleep more without any pain relief. States that he has been told that a toe on his right foot and possibly his right foot may need to be amputated.  He does not want amputation and is asking about getting a second opinion at [facility] that he has tried contacting them without a response back yet. He is encouraged to use any combination of OTC creams and patches that do give him any relief.  Have given report to [facility] RN/SW team to assist with this and to update on attempts of pain management.  I spent 50 minutes providing this consultation,  from 4:30 to 5:20 including time spent with patient/family, chart review, provider coordination, documentation. More than 50% of the time in this consultation was spent coordinating communication.   HISTORY OF PRESENT ILLNESS:  Peter [name] is a 86  y.o. year old male with multiple medical problems including chronic pain in back and right leg, anemia, CKD stage 3, HTN, HLD, PAD, mild dementia. Palliative Care was asked to help address goals of care.  CODE STATUS: DNR  PPS: 50% HOSPICE ELIGIBILITY/DIAGNOSIS: TBD  PHYSICAL EXAM:   General: NAD, frail appearing,Extremities: no edema, no joint deformities Skin: no rasheson exposed skin Neurological: Weakness;A&O x3 but has forgetfulness and will repeat himself  PAST MEDICAL HISTORY:  Past Medical History:  Diagnosis Date  . Allergy   . Anemia due to stage 3b chronic kidney disease ([place]) 10/25/2019  . Arthritis   . Carpal tunnel syndrome   . CKD (chronic kidney disease), stage III ([place])   . Coronary artery disease    a. 03/2018 PCI [facility]): RCA 95p, 36m (2.5x38 [place] DES); b. 06/2019 PCI: LM 40ost, LAD 85p, 60p/m (atherectomy w/ 3.0x48 Synergy DES p/m LAD), 16m, LCX 50d, OM2/3 50, RCA patent prox/mid stent, 50d, RPDA 50.  . Diabetes mellitus without complication ([place])   . Diastolic dysfunction    a. 06/2019 Echo: EF 60-65%, no rwma, mod LVH, Gr1 DD. Nl RV fxn. Mildly dil LA. Mild-mod TR.  . Diverticulitis   . GERD (gastroesophageal reflux disease)   . Gout   . History of blood transfusion   . History of chicken pox   . Hyperlipidemia   . Hypertension   . Mild dementia ([place])   . Myocardial infarction ([place]) 03/2018  . Normocytic anemia  a. 06/2019 s/p 1u PRBCs.  [name] PAD (peripheral artery disease) ([place])    a. 11/2018 s/p R SFA, popliteal, and peroneal artery PTA.  . Prostate cancer [facility])    prostate  . Prostate cancer ([place])   . Syncope     SOCIAL HX:  Social History   Tobacco Use  . Smoking status: Former Smoker    Packs/day: 0.75    Years: 12.00    Pack years: 9.00    Types: Cigars, Cigarettes    Quit date: 1970    Years since quitting: 51.8  . Smokeless tobacco: Former Systems developer    Types: [place] date: 2016  Substance Use Topics  . Alcohol  use: Yes    Comment: occasional    ALLERGIES:  Allergies  Allergen Reactions  . Penicillins Anaphylaxis    Reported airway compromise 50 years     PERTINENT MEDICATIONS:  Outpatient Encounter Medications as of 11/22/2019  Medication Sig  . alendronate (FOSAMAX) 70 MG tablet Take 70 mg by mouth every Saturday.   [name] allopurinol (ZYLOPRIM) 100 MG tablet TAKE 1 TABLET BY MOUTH EVERY DAY (Patient taking differently: Take 100 mg by mouth daily. )  . amLODipine (NORVASC) 10 MG tablet TAKE 1 TABLET BY MOUTH EVERY DAY (Patient taking differently: Take 10 mg by mouth daily. )  . Ascorbic Acid (VITAMIN C) 1000 MG tablet Take 1,000 mg by mouth once a week.   [name] aspirin EC 81 MG tablet Take 81 mg by mouth every evening.   [name] atorvastatin (LIPITOR) 80 MG tablet Take 80 mg by mouth daily.  . beta carotene 25000 UNIT capsule Take 25,000 Units by mouth once a week.   . clopidogrel (PLAVIX) 75 MG tablet TAKE 1 TABLET BY MOUTH EVERY DAY (Patient taking differently: Take 75 mg by mouth daily. )  . donepezil (ARICEPT) 10 MG tablet Take 10 mg by mouth daily.   [name] doxazosin (CARDURA) 4 MG tablet Take 4 mg by mouth daily.  . ferrous sulfate 325 (65 FE) MG tablet TAKE 1 TABLET BY MOUTH EVERY DAY (Patient taking differently: Take 325 mg by mouth daily. )  . fidaxomicin (DIFICID) 200 MG TABS tablet Take 200 mg by mouth 2 (two) times daily.  . finasteride (PROSCAR) 5 MG tablet Take 1 tablet (5 mg total) by mouth daily.  . Flaxseed, Linseed, (FLAX SEED OIL) 1000 MG CAPS Take 1,000 mg by mouth once a week.   . folic acid (FOLVITE) 297 MCG tablet Take 400 mcg by mouth daily as needed (unsure).   . gabapentin (NEURONTIN) 300 MG capsule Take 1 capsule (300 mg total) by mouth 2 (two) times daily.  . hydrALAZINE (APRESOLINE) 50 MG tablet TAKE 1 TABLET BY MOUTH EVERY 8 HOURS. (Patient taking differently: Take 50 mg by mouth 3 (three) times daily. )  . hydrochlorothiazide (HYDRODIURIL) 12.5 MG tablet Take 12.5 mg by mouth at  bedtime.   . Insulin Syringe-Needle U-100 (TRUEPLUS INSULIN SYRINGE) 31G X 5/16" 1 ML MISC Use daily as directed. Dispense needles as prescribed in the past. DX: E11.22  . isosorbide mononitrate (IMDUR) 60 MG 24 hr tablet Take 1 tablet (60 mg total) by mouth daily.  [name] LANTUS 100 UNIT/ML injection Inject 20 Units into the skin at bedtime.  [name] loperamide (IMODIUM) 2 MG capsule Take 1 capsule (2 mg total) by mouth every 4 (four) hours as needed for diarrhea or loose stools. (Patient not taking: Reported on 10/25/2019)  . losartan (COZAAR) 100 MG tablet  TAKE 1 TABLET BY MOUTH EVERY DAY (Patient taking differently: Take 100 mg by mouth daily. )  . memantine (NAMENDA) 10 MG tablet TAKE 1 TABLET BY MOUTH TWICE A DAY (Patient taking differently: Take 10 mg by mouth in the morning and at bedtime. )  . morphine (MSIR) 15 MG tablet Take 15 mg by mouth 2 (two) times daily as needed for severe pain.  (Patient not taking: Reported on 11/17/2019)  . nitroGLYCERIN (NITROSTAT) 0.4 MG SL tablet Place 1 tablet (0.4 mg total) under the tongue every 5 (five) minutes x 3 doses as needed for chest pain.  . Omega-3 Fatty Acids (FISH OIL) 1000 MG CPDR Take 1,000 Units by mouth once a week.   [name] oxyCODONE-acetaminophen (PERCOCET/ROXICET) 5-325 MG tablet Take 1 tablet by mouth every 4 (four) hours as needed for severe pain.  [name] selenium 50 MCG TABS tablet Take 50 mcg by mouth daily as needed (unknown).   . sertraline (ZOLOFT) 50 MG tablet Take 1 tablet (50 mg total) by mouth at bedtime.   No facility-administered encounter medications on file as of 11/22/2019.     Tayton Decaire Jenetta Downer, NP

## 2019-11-23 ENCOUNTER — Ambulatory Visit: Payer: Medicare Other

## 2019-11-24 DIAGNOSIS — M1711 Unilateral primary osteoarthritis, right knee: Secondary | ICD-10-CM | POA: Diagnosis not present

## 2019-11-24 DIAGNOSIS — S82101A Unspecified fracture of upper end of right tibia, initial encounter for closed fracture: Secondary | ICD-10-CM | POA: Diagnosis not present

## 2019-11-24 DIAGNOSIS — M4300 Spondylolysis, site unspecified: Secondary | ICD-10-CM | POA: Diagnosis not present

## 2019-11-25 ENCOUNTER — Telehealth: Payer: Self-pay | Admitting: Adult Health Nurse Practitioner

## 2019-11-25 ENCOUNTER — Ambulatory Visit: Payer: Medicare Other

## 2019-11-25 DIAGNOSIS — I10 Essential (primary) hypertension: Secondary | ICD-10-CM | POA: Diagnosis not present

## 2019-11-25 DIAGNOSIS — D509 Iron deficiency anemia, unspecified: Secondary | ICD-10-CM | POA: Diagnosis not present

## 2019-11-25 DIAGNOSIS — F329 Major depressive disorder, single episode, unspecified: Secondary | ICD-10-CM | POA: Diagnosis not present

## 2019-11-25 DIAGNOSIS — G894 Chronic pain syndrome: Secondary | ICD-10-CM | POA: Diagnosis not present

## 2019-11-25 DIAGNOSIS — M545 Low back pain: Secondary | ICD-10-CM | POA: Diagnosis not present

## 2019-11-25 DIAGNOSIS — E1142 Type 2 diabetes mellitus with diabetic polyneuropathy: Secondary | ICD-10-CM | POA: Diagnosis not present

## 2019-11-25 NOTE — Telephone Encounter (Signed)
Spoke with caregiver, patient is wanting a refill on the oxycodone 5 mg Q4 hours PRN for pain.  Sent escript to [facility] in [place].  Checked patient on [place] Substance Abuse Database;no concerns. Dino Borntreger K. Olena Heckle NP

## 2019-11-26 DIAGNOSIS — D509 Iron deficiency anemia, unspecified: Secondary | ICD-10-CM | POA: Diagnosis not present

## 2019-11-26 DIAGNOSIS — R2689 Other abnormalities of gait and mobility: Secondary | ICD-10-CM | POA: Diagnosis not present

## 2019-11-26 DIAGNOSIS — Z87891 Personal history of nicotine dependence: Secondary | ICD-10-CM | POA: Diagnosis not present

## 2019-11-26 DIAGNOSIS — E1142 Type 2 diabetes mellitus with diabetic polyneuropathy: Secondary | ICD-10-CM | POA: Diagnosis not present

## 2019-11-26 DIAGNOSIS — I10 Essential (primary) hypertension: Secondary | ICD-10-CM | POA: Diagnosis not present

## 2019-11-26 DIAGNOSIS — G894 Chronic pain syndrome: Secondary | ICD-10-CM | POA: Diagnosis not present

## 2019-11-26 DIAGNOSIS — M545 Low back pain, unspecified: Secondary | ICD-10-CM | POA: Diagnosis not present

## 2019-11-26 DIAGNOSIS — F329 Major depressive disorder, single episode, unspecified: Secondary | ICD-10-CM | POA: Diagnosis not present

## 2019-11-29 ENCOUNTER — Other Ambulatory Visit: Payer: Self-pay | Admitting: Family Medicine

## 2019-11-29 ENCOUNTER — Emergency Department: Payer: Medicare Other

## 2019-11-29 ENCOUNTER — Inpatient Hospital Stay
Admission: EM | Admit: 2019-11-29 | Discharge: 2019-12-02 | DRG: 299 | Disposition: A | Discharge status: Home Health | Payer: Medicare Other | Attending: Internal Medicine | Admitting: Internal Medicine

## 2019-11-29 ENCOUNTER — Encounter: Payer: Self-pay | Admitting: Emergency Medicine

## 2019-11-29 ENCOUNTER — Other Ambulatory Visit: Payer: Self-pay

## 2019-11-29 DIAGNOSIS — I70263 Atherosclerosis of native arteries of extremities with gangrene, bilateral legs: Secondary | ICD-10-CM | POA: Diagnosis not present

## 2019-11-29 DIAGNOSIS — G894 Chronic pain syndrome: Secondary | ICD-10-CM | POA: Diagnosis not present

## 2019-11-29 DIAGNOSIS — I959 Hypotension, unspecified: Secondary | ICD-10-CM | POA: Diagnosis present

## 2019-11-29 DIAGNOSIS — R29700 NIHSS score 0: Secondary | ICD-10-CM | POA: Diagnosis present

## 2019-11-29 DIAGNOSIS — I70261 Atherosclerosis of native arteries of extremities with gangrene, right leg: Secondary | ICD-10-CM | POA: Diagnosis present

## 2019-11-29 DIAGNOSIS — F0390 Unspecified dementia without behavioral disturbance: Secondary | ICD-10-CM | POA: Diagnosis not present

## 2019-11-29 DIAGNOSIS — Z66 Do not resuscitate: Secondary | ICD-10-CM | POA: Diagnosis present

## 2019-11-29 DIAGNOSIS — Z794 Long term (current) use of insulin: Secondary | ICD-10-CM

## 2019-11-29 DIAGNOSIS — Z880 Allergy status to penicillin: Secondary | ICD-10-CM

## 2019-11-29 DIAGNOSIS — Z951 Presence of aortocoronary bypass graft: Secondary | ICD-10-CM

## 2019-11-29 DIAGNOSIS — F321 Major depressive disorder, single episode, moderate: Secondary | ICD-10-CM

## 2019-11-29 DIAGNOSIS — G63 Polyneuropathy in diseases classified elsewhere: Secondary | ICD-10-CM

## 2019-11-29 DIAGNOSIS — Z8673 Personal history of transient ischemic attack (TIA), and cerebral infarction without residual deficits: Secondary | ICD-10-CM | POA: Diagnosis not present

## 2019-11-29 DIAGNOSIS — Z79899 Other long term (current) drug therapy: Secondary | ICD-10-CM

## 2019-11-29 DIAGNOSIS — K219 Gastro-esophageal reflux disease without esophagitis: Secondary | ICD-10-CM | POA: Diagnosis present

## 2019-11-29 DIAGNOSIS — Z7902 Long term (current) use of antithrombotics/antiplatelets: Secondary | ICD-10-CM

## 2019-11-29 DIAGNOSIS — T50995A Adverse effect of other drugs, medicaments and biological substances, initial encounter: Secondary | ICD-10-CM | POA: Diagnosis present

## 2019-11-29 DIAGNOSIS — G459 Transient cerebral ischemic attack, unspecified: Secondary | ICD-10-CM

## 2019-11-29 DIAGNOSIS — Z955 Presence of coronary angioplasty implant and graft: Secondary | ICD-10-CM

## 2019-11-29 DIAGNOSIS — I2510 Atherosclerotic heart disease of native coronary artery without angina pectoris: Secondary | ICD-10-CM | POA: Diagnosis not present

## 2019-11-29 DIAGNOSIS — D509 Iron deficiency anemia, unspecified: Secondary | ICD-10-CM | POA: Diagnosis present

## 2019-11-29 DIAGNOSIS — E1122 Type 2 diabetes mellitus with diabetic chronic kidney disease: Secondary | ICD-10-CM | POA: Diagnosis not present

## 2019-11-29 DIAGNOSIS — G629 Polyneuropathy, unspecified: Secondary | ICD-10-CM

## 2019-11-29 DIAGNOSIS — G9341 Metabolic encephalopathy: Secondary | ICD-10-CM | POA: Diagnosis present

## 2019-11-29 DIAGNOSIS — M545 Low back pain, unspecified: Secondary | ICD-10-CM | POA: Diagnosis not present

## 2019-11-29 DIAGNOSIS — Z89421 Acquired absence of other right toe(s): Secondary | ICD-10-CM | POA: Diagnosis not present

## 2019-11-29 DIAGNOSIS — M2041 Other hammer toe(s) (acquired), right foot: Secondary | ICD-10-CM | POA: Diagnosis not present

## 2019-11-29 DIAGNOSIS — I129 Hypertensive chronic kidney disease with stage 1 through stage 4 chronic kidney disease, or unspecified chronic kidney disease: Secondary | ICD-10-CM | POA: Diagnosis present

## 2019-11-29 DIAGNOSIS — I96 Gangrene, not elsewhere classified: Secondary | ICD-10-CM

## 2019-11-29 DIAGNOSIS — N1832 Chronic kidney disease, stage 3b: Secondary | ICD-10-CM

## 2019-11-29 DIAGNOSIS — Z20822 Contact with and (suspected) exposure to covid-19: Secondary | ICD-10-CM | POA: Diagnosis present

## 2019-11-29 DIAGNOSIS — Z23 Encounter for immunization: Secondary | ICD-10-CM

## 2019-11-29 DIAGNOSIS — E1159 Type 2 diabetes mellitus with other circulatory complications: Secondary | ICD-10-CM | POA: Diagnosis not present

## 2019-11-29 DIAGNOSIS — N400 Enlarged prostate without lower urinary tract symptoms: Secondary | ICD-10-CM | POA: Diagnosis present

## 2019-11-29 DIAGNOSIS — I739 Peripheral vascular disease, unspecified: Secondary | ICD-10-CM | POA: Diagnosis present

## 2019-11-29 DIAGNOSIS — E1152 Type 2 diabetes mellitus with diabetic peripheral angiopathy with gangrene: Secondary | ICD-10-CM | POA: Diagnosis present

## 2019-11-29 DIAGNOSIS — F329 Major depressive disorder, single episode, unspecified: Secondary | ICD-10-CM | POA: Diagnosis not present

## 2019-11-29 DIAGNOSIS — N179 Acute kidney failure, unspecified: Secondary | ICD-10-CM | POA: Diagnosis not present

## 2019-11-29 DIAGNOSIS — M87078 Idiopathic aseptic necrosis of left toe(s): Secondary | ICD-10-CM | POA: Diagnosis not present

## 2019-11-29 DIAGNOSIS — I6523 Occlusion and stenosis of bilateral carotid arteries: Secondary | ICD-10-CM | POA: Diagnosis not present

## 2019-11-29 DIAGNOSIS — I10 Essential (primary) hypertension: Secondary | ICD-10-CM | POA: Diagnosis not present

## 2019-11-29 DIAGNOSIS — I252 Old myocardial infarction: Secondary | ICD-10-CM

## 2019-11-29 DIAGNOSIS — E1140 Type 2 diabetes mellitus with diabetic neuropathy, unspecified: Secondary | ICD-10-CM | POA: Diagnosis present

## 2019-11-29 DIAGNOSIS — D649 Anemia, unspecified: Secondary | ICD-10-CM

## 2019-11-29 DIAGNOSIS — E785 Hyperlipidemia, unspecified: Secondary | ICD-10-CM | POA: Diagnosis present

## 2019-11-29 DIAGNOSIS — E1142 Type 2 diabetes mellitus with diabetic polyneuropathy: Secondary | ICD-10-CM | POA: Diagnosis not present

## 2019-11-29 DIAGNOSIS — Z7982 Long term (current) use of aspirin: Secondary | ICD-10-CM

## 2019-11-29 DIAGNOSIS — Z7983 Long term (current) use of bisphosphonates: Secondary | ICD-10-CM

## 2019-11-29 DIAGNOSIS — Y92009 Unspecified place in unspecified non-institutional (private) residence as the place of occurrence of the external cause: Secondary | ICD-10-CM

## 2019-11-29 DIAGNOSIS — M79674 Pain in right toe(s): Secondary | ICD-10-CM | POA: Diagnosis not present

## 2019-11-29 DIAGNOSIS — Z8249 Family history of ischemic heart disease and other diseases of the circulatory system: Secondary | ICD-10-CM

## 2019-11-29 DIAGNOSIS — D631 Anemia in chronic kidney disease: Secondary | ICD-10-CM | POA: Diagnosis present

## 2019-11-29 DIAGNOSIS — L03115 Cellulitis of right lower limb: Secondary | ICD-10-CM

## 2019-11-29 DIAGNOSIS — L97519 Non-pressure chronic ulcer of other part of right foot with unspecified severity: Secondary | ICD-10-CM | POA: Diagnosis present

## 2019-11-29 DIAGNOSIS — R531 Weakness: Secondary | ICD-10-CM | POA: Diagnosis not present

## 2019-11-29 DIAGNOSIS — Z87891 Personal history of nicotine dependence: Secondary | ICD-10-CM

## 2019-11-29 DIAGNOSIS — M7989 Other specified soft tissue disorders: Secondary | ICD-10-CM | POA: Diagnosis not present

## 2019-11-29 DIAGNOSIS — E1129 Type 2 diabetes mellitus with other diabetic kidney complication: Secondary | ICD-10-CM | POA: Diagnosis present

## 2019-11-29 DIAGNOSIS — Z8679 Personal history of other diseases of the circulatory system: Secondary | ICD-10-CM | POA: Diagnosis not present

## 2019-11-29 DIAGNOSIS — Z833 Family history of diabetes mellitus: Secondary | ICD-10-CM

## 2019-11-29 DIAGNOSIS — E11621 Type 2 diabetes mellitus with foot ulcer: Secondary | ICD-10-CM | POA: Diagnosis present

## 2019-11-29 DIAGNOSIS — Z8546 Personal history of malignant neoplasm of prostate: Secondary | ICD-10-CM

## 2019-11-29 DIAGNOSIS — I69320 Aphasia following cerebral infarction: Secondary | ICD-10-CM | POA: Diagnosis not present

## 2019-11-29 LAB — DIFFERENTIAL
Abs Immature Granulocytes: 0.05 10*3/uL (ref 0.00–0.07)
Basophils Absolute: 0.1 10*3/uL (ref 0.0–0.1)
Basophils Relative: 1 %
Eosinophils Absolute: 0 10*3/uL (ref 0.0–0.5)
Eosinophils Relative: 0 %
Immature Granulocytes: 1 %
Lymphocytes Relative: 13 %
Lymphs Abs: 1.2 10*3/uL (ref 0.7–4.0)
Monocytes Absolute: 0.3 10*3/uL (ref 0.1–1.0)
Monocytes Relative: 3 %
Neutro Abs: 7.5 10*3/uL (ref 1.7–7.7)
Neutrophils Relative %: 82 %

## 2019-11-29 LAB — CBC
HCT: 27.2 % — ABNORMAL LOW (ref 39.0–52.0)
Hemoglobin: 8.1 g/dL — ABNORMAL LOW (ref 13.0–17.0)
MCH: 25 pg — ABNORMAL LOW (ref 26.0–34.0)
MCHC: 29.8 g/dL — ABNORMAL LOW (ref 30.0–36.0)
MCV: 84 fL (ref 80.0–100.0)
Platelets: 433 10*3/uL — ABNORMAL HIGH (ref 150–400)
RBC: 3.24 MIL/uL — ABNORMAL LOW (ref 4.22–5.81)
RDW: 16.1 % — ABNORMAL HIGH (ref 11.5–15.5)
WBC: 9.1 10*3/uL (ref 4.0–10.5)
nRBC: 0 % (ref 0.0–0.2)

## 2019-11-29 LAB — COMPREHENSIVE METABOLIC PANEL
ALT: 26 U/L (ref 0–44)
AST: 23 U/L (ref 15–41)
Albumin: 3.6 g/dL (ref 3.5–5.0)
Alkaline Phosphatase: 91 U/L (ref 38–126)
Anion gap: 13 (ref 5–15)
BUN: 44 mg/dL — ABNORMAL HIGH (ref 8–23)
CO2: 23 mmol/L (ref 22–32)
Calcium: 8.6 mg/dL — ABNORMAL LOW (ref 8.9–10.3)
Chloride: 103 mmol/L (ref 98–111)
Creatinine, Ser: 1.75 mg/dL — ABNORMAL HIGH (ref 0.61–1.24)
GFR, Estimated: 34 mL/min — ABNORMAL LOW (ref >60)
Glucose, Bld: 162 mg/dL — ABNORMAL HIGH (ref 70–99)
Potassium: 4.7 mmol/L (ref 3.5–5.1)
Sodium: 139 mmol/L (ref 135–145)
Total Bilirubin: 0.7 mg/dL (ref 0.3–1.2)
Total Protein: 7.2 g/dL (ref 6.5–8.1)

## 2019-11-29 LAB — PROTIME-INR
INR: 1.1 (ref 0.8–1.2)
Prothrombin Time: 13.6 seconds (ref 11.4–15.2)

## 2019-11-29 LAB — APTT: aPTT: 30 seconds (ref 24–36)

## 2019-11-29 IMAGING — US US EXTREM LOW VENOUS*R*
1 series · 13 of 24 positions shown · non-contrast
Comparison: None.

CLINICAL DATA: Redness and swelling in the right foot tonight.
Necrotic second toe. History of pain, edema, color changes, prostate
cancer, anticoagulation therapy.

EXAM:
Right LOWER EXTREMITY VENOUS DOPPLER ULTRASOUND
TECHNIQUE: Gray-scale sonography with compression, as well as color and duplex
ultrasound, were performed to evaluate the deep venous system(s)
from the level of the common femoral vein through the popliteal and
proximal calf veins.

[Series 1: us venous img lower uni right (dvt) · portal-venous · 13 of 38 slices shown]
[im 1/38]
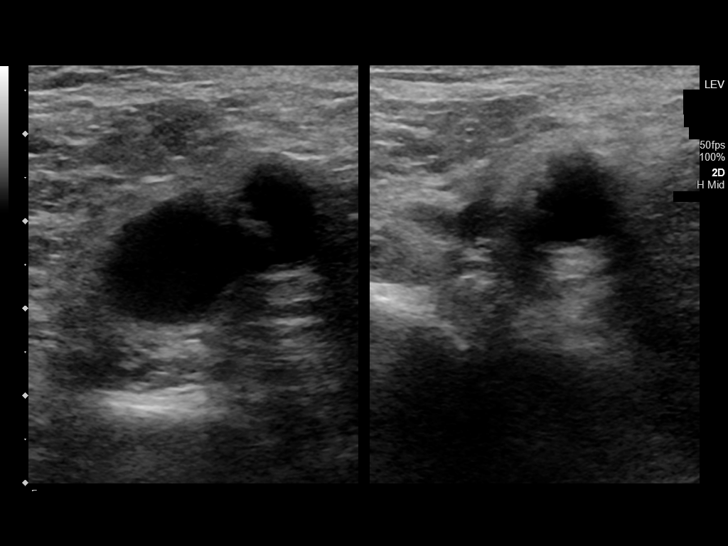
[im 4/38]
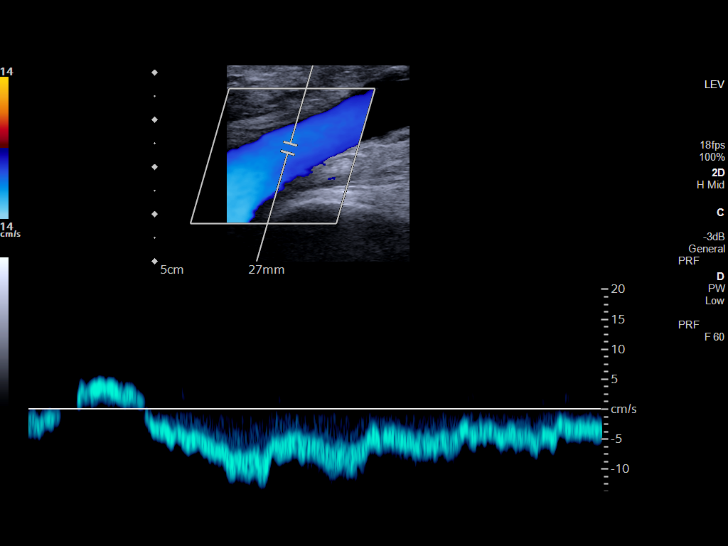
[im 7/38]
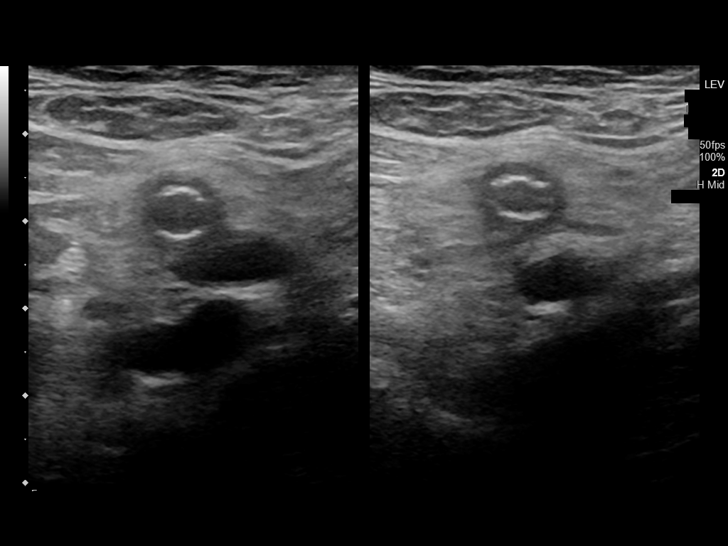
[im 10/38]
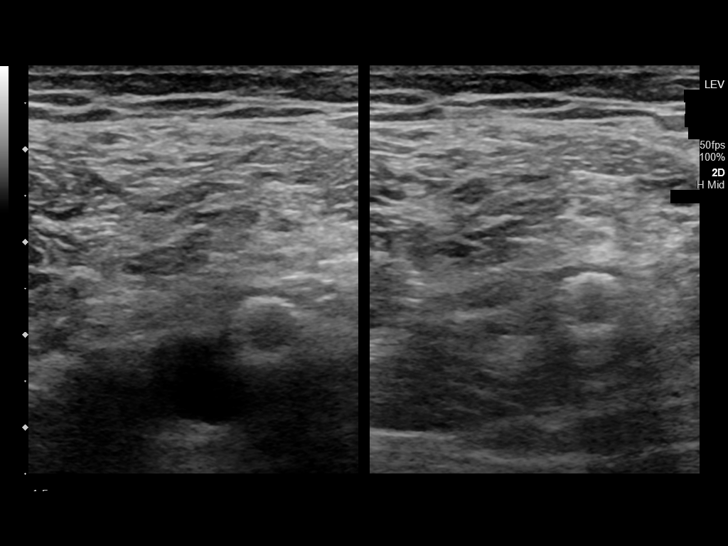
[im 13/38]
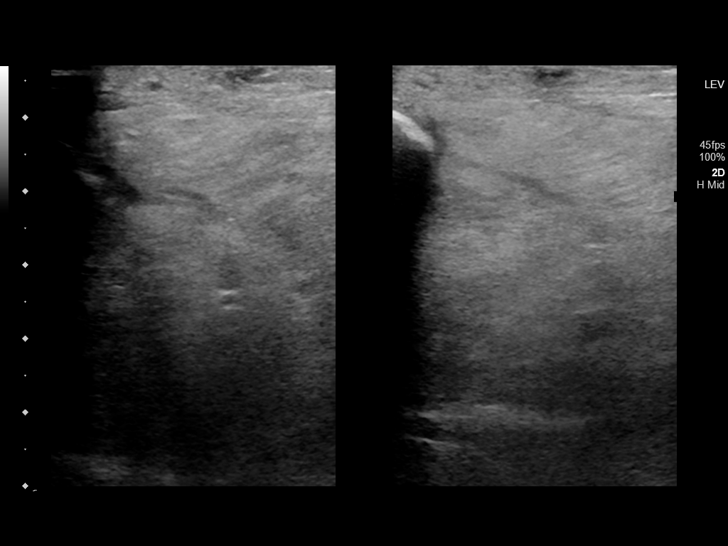
[im 17/38]
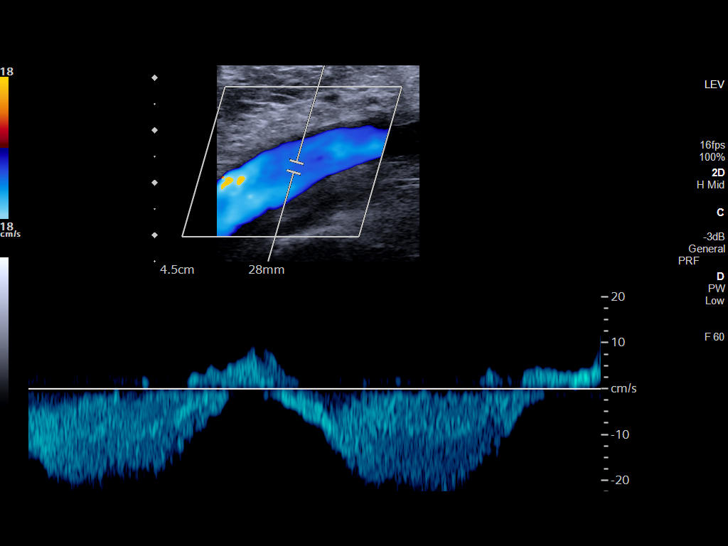
[im 20/38]
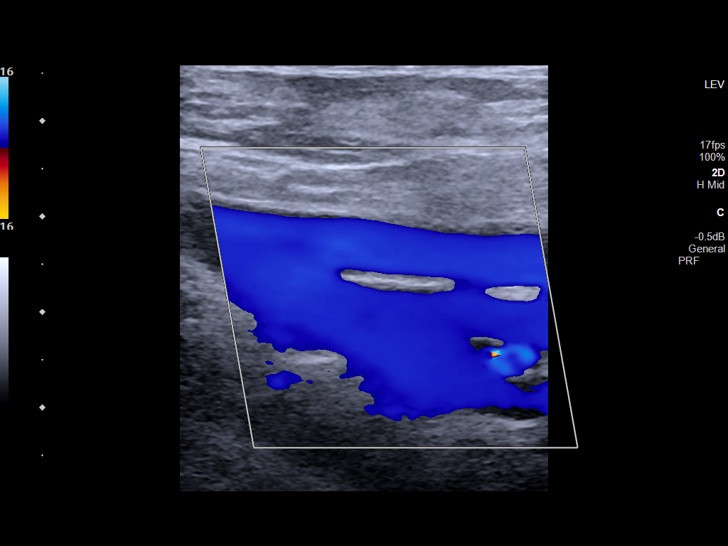
[im 21/38]
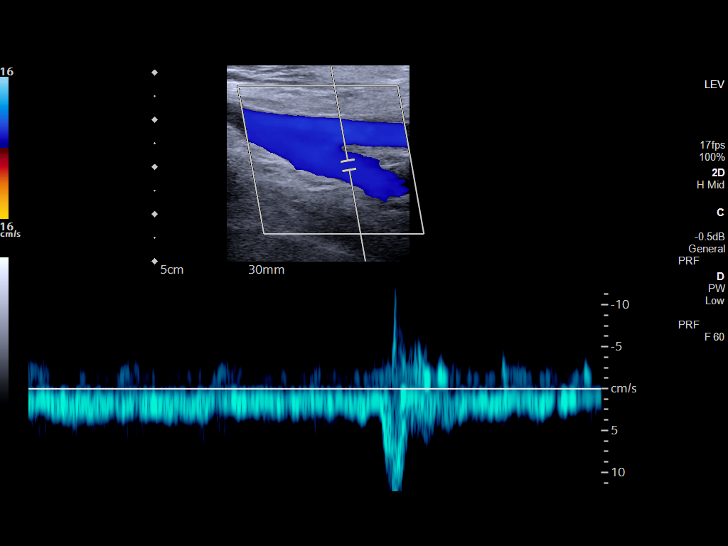
[im 25/38]
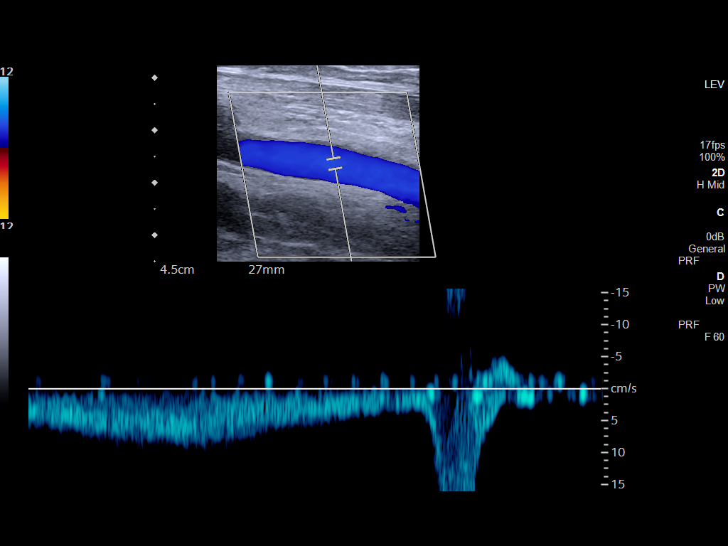
[im 28/38]
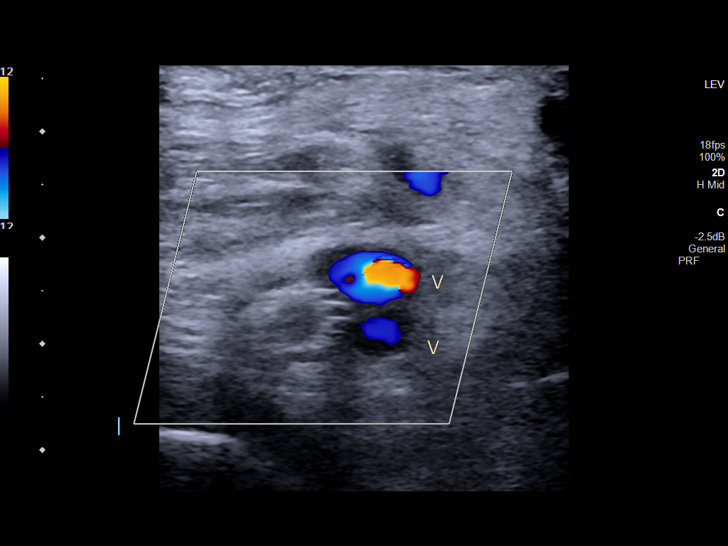
[im 31/38]
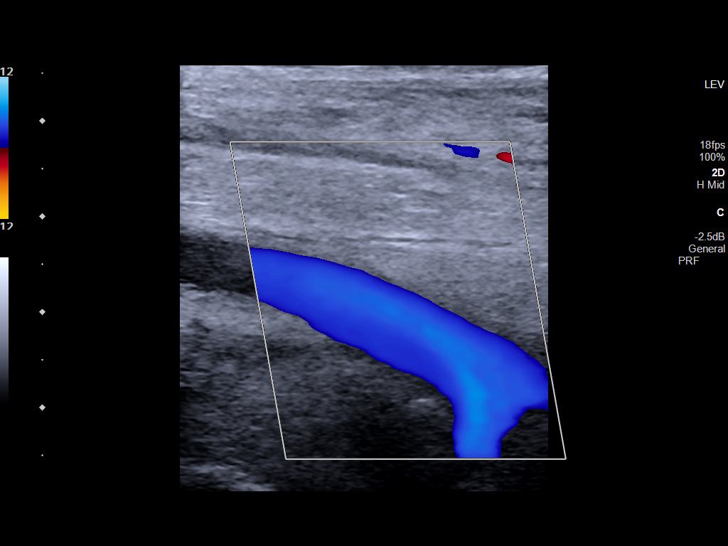
[im 34/38]
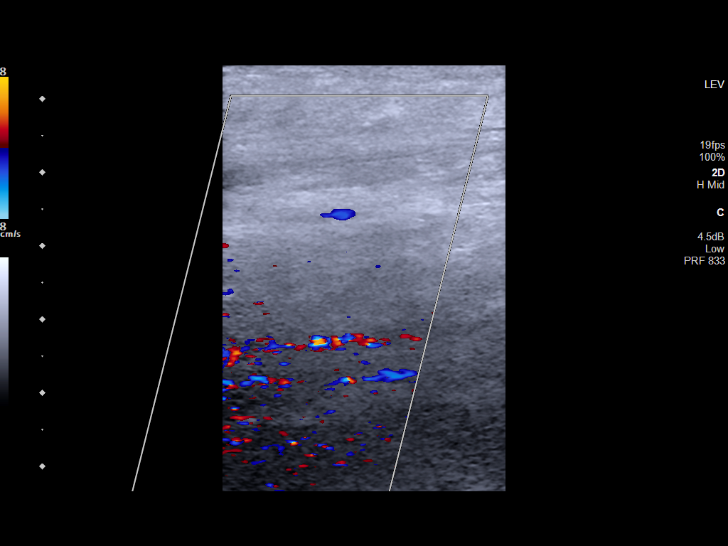
[im 38/38]
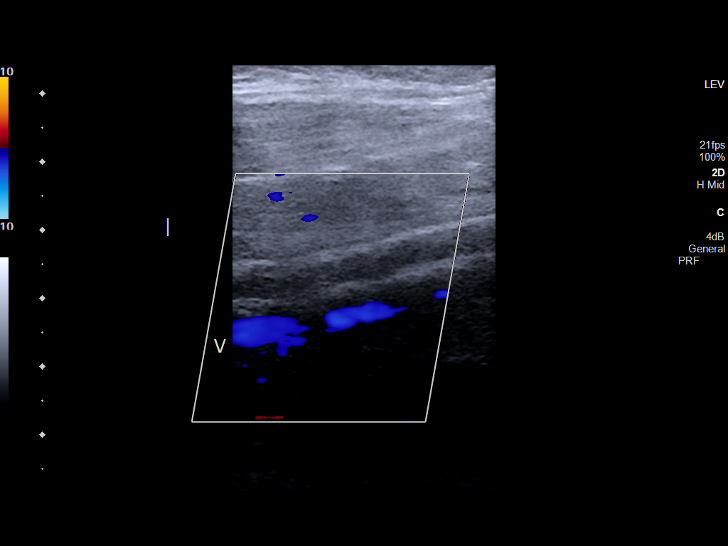

[13 of 24 positions shown; findings below may reference images not displayed]

FINDINGS: VENOUS

Normal compressibility of the common femoral, superficial femoral,
and popliteal veins, as well as the visualized calf veins.
Visualized portions of profunda femoral vein and great saphenous
vein unremarkable. No filling defects to suggest DVT on grayscale or
color Doppler imaging. Doppler waveforms show normal direction of
venous flow, normal respiratory plasticity and response to
augmentation.

Limited views of the contralateral common femoral vein are
unremarkable.

OTHER

Incidental finding of what appears to be a right femoral to
popliteal arterial bypass graft. The arterial system was not
assessed in its entirety on this venous study, but there is evidence
of graft occlusion or thrombosis.

Limitations: none
IMPRESSION: 1. No evidence of deep venous thrombosis in the right lower
extremity.
2. Incidental finding of what appears to be an occluded right
femoral to popliteal arterial bypass graft.

## 2019-11-29 IMAGING — CT CT HEAD W/O CM
3 series · 15 of 46 positions shown, 18 images · non-contrast
Comparison: None.

CLINICAL DATA: History of prostate cancer.  TIA.

EXAM:
CT HEAD WITHOUT CONTRAST
TECHNIQUE: Contiguous axial images were obtained from the base of the skull
through the vertex without intravenous contrast.

[Series 2: head wo · axial · 0.39mm/px · z∈[+573,+693]mm · 9 of 29 slices shown, 12 images]
[im 3/29  brain]
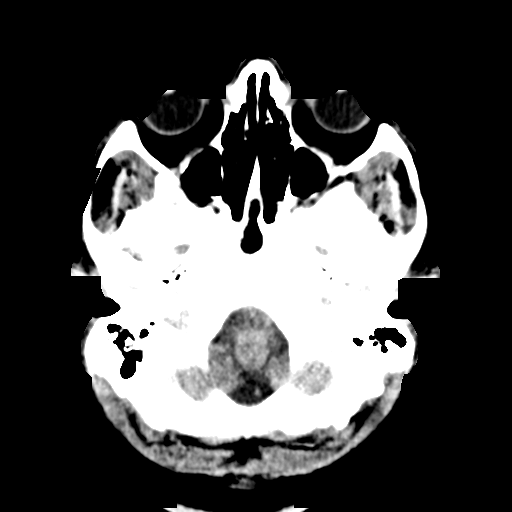
[im 3/29  bone]
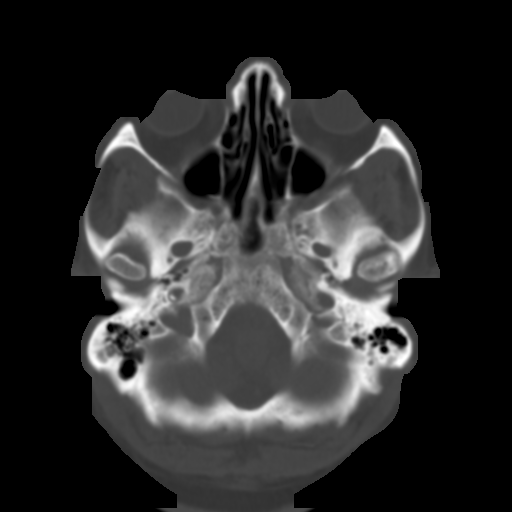
[im 6/29  brain]
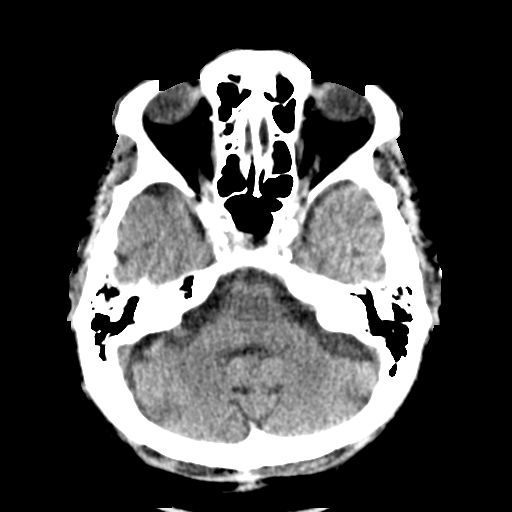
[im 9/29  brain]
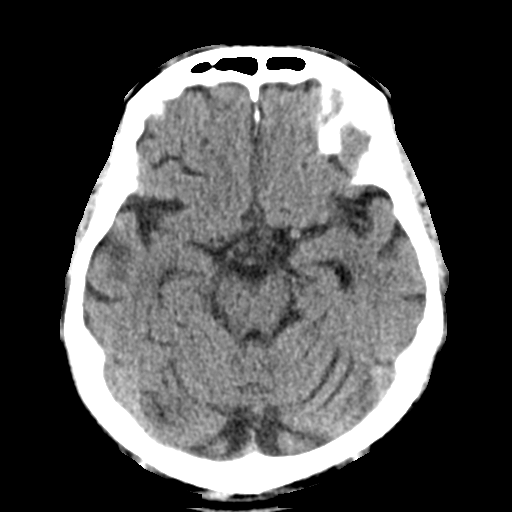
[im 12/29  brain]
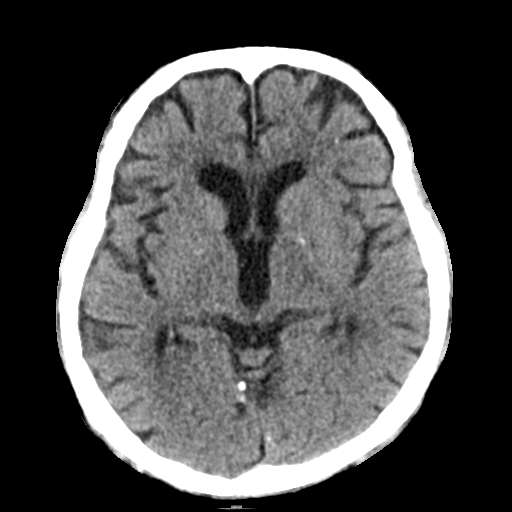
[im 15/29  brain]
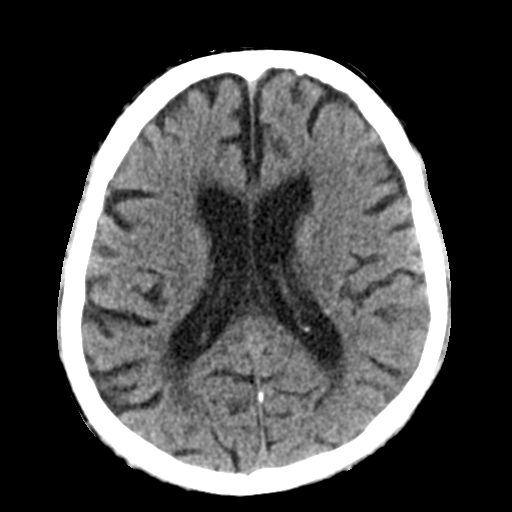
[im 15/29  bone]
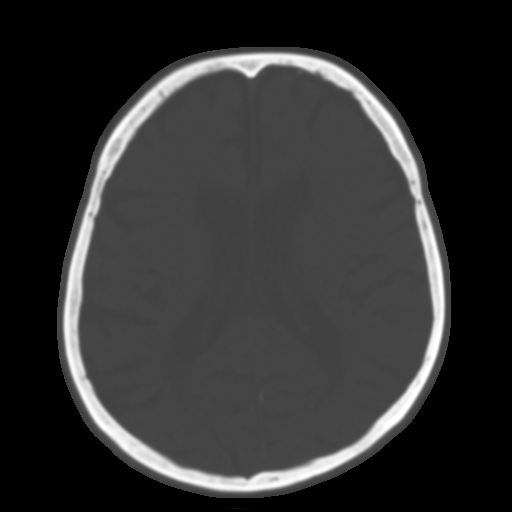
[im 18/29  brain]
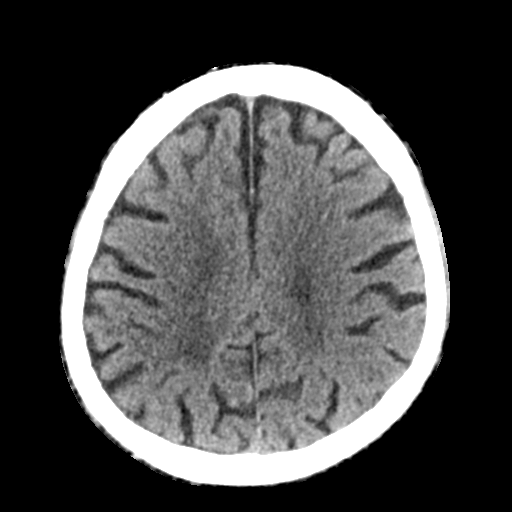
[im 21/29  brain]
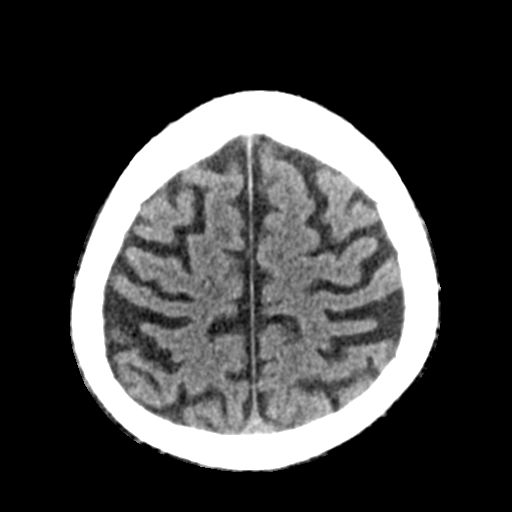
[im 24/29  brain]
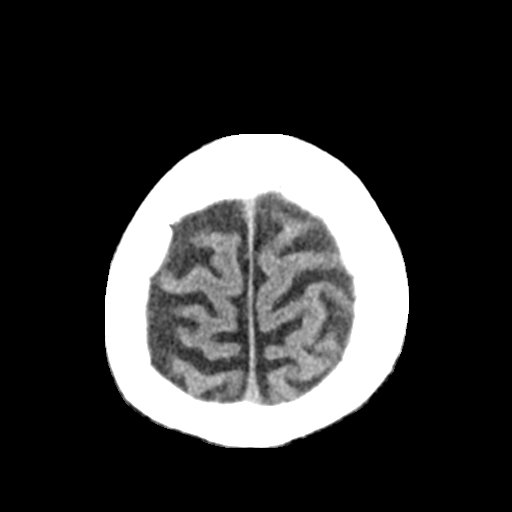
[im 27/29  brain]
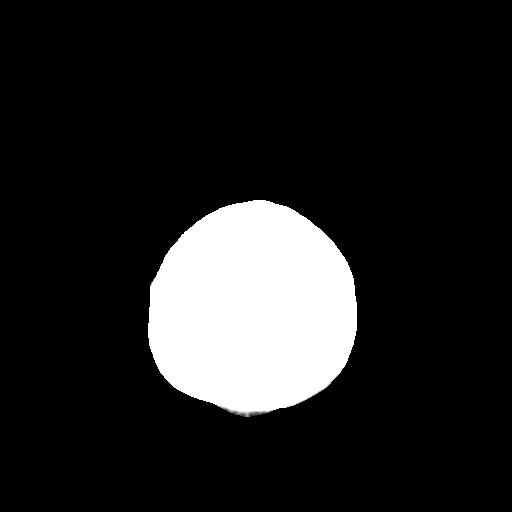
[im 27/29  bone]
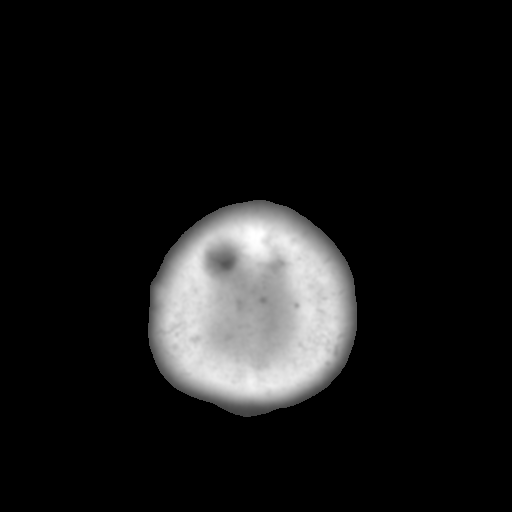

[Series 4: coronal soft tissue · coronal · 0.30mm/px · 3 of 60 slices shown]
[im 20/60  brain]
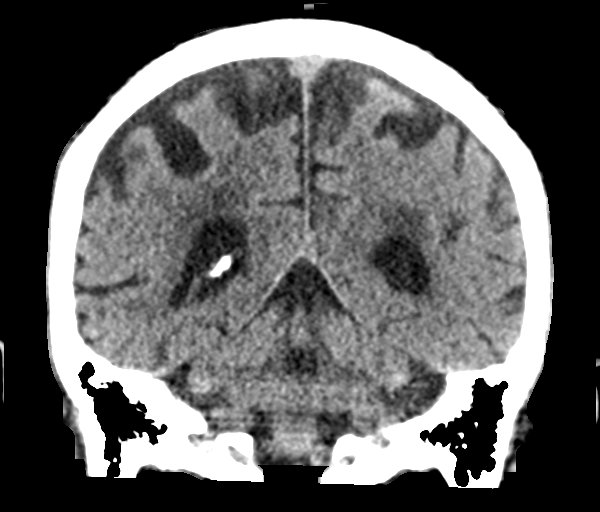
[im 27/60  brain]
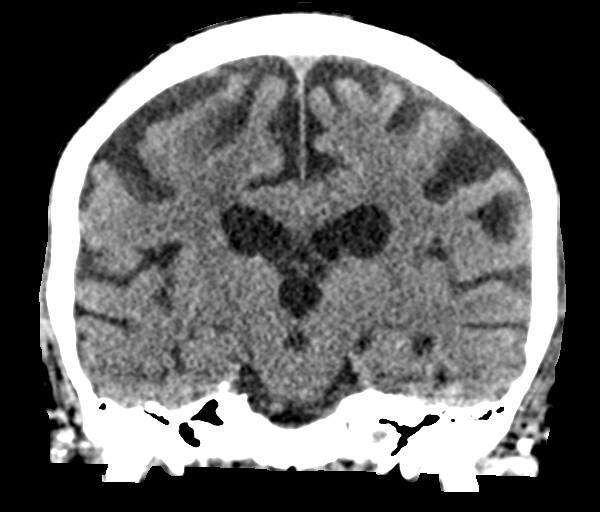
[im 33/60  brain]
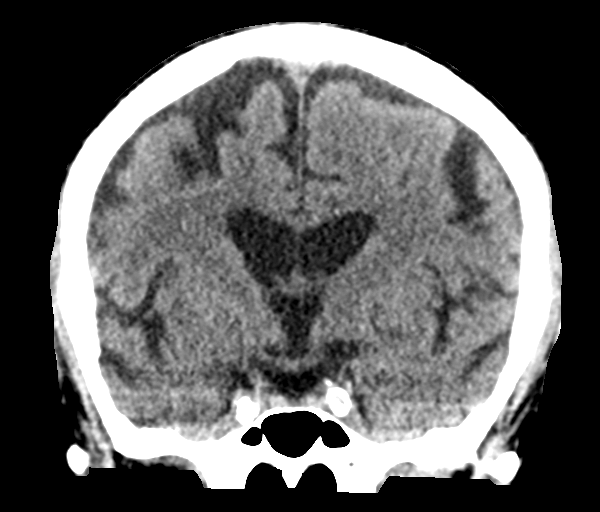

[Series 5: sagittal soft tissue · sagittal · 0.30mm/px · 3 of 55 slices shown]
[im 19/55  brain]
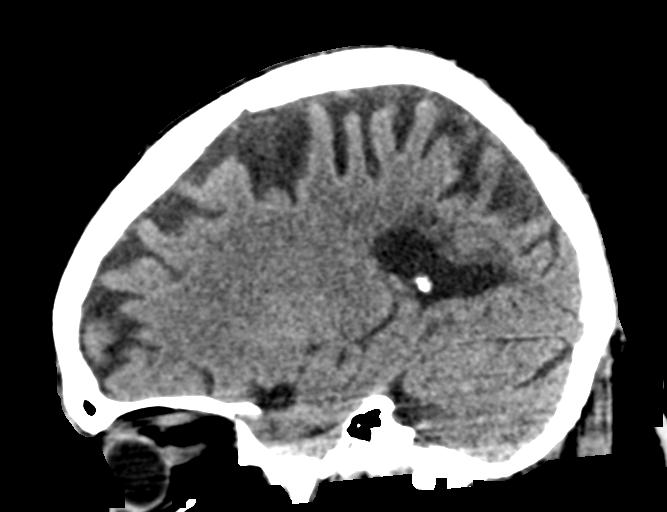
[im 28/55  brain]
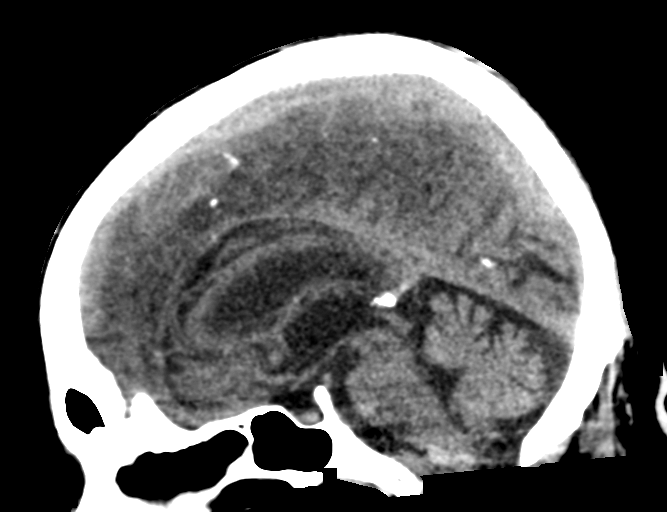
[im 37/55  brain]
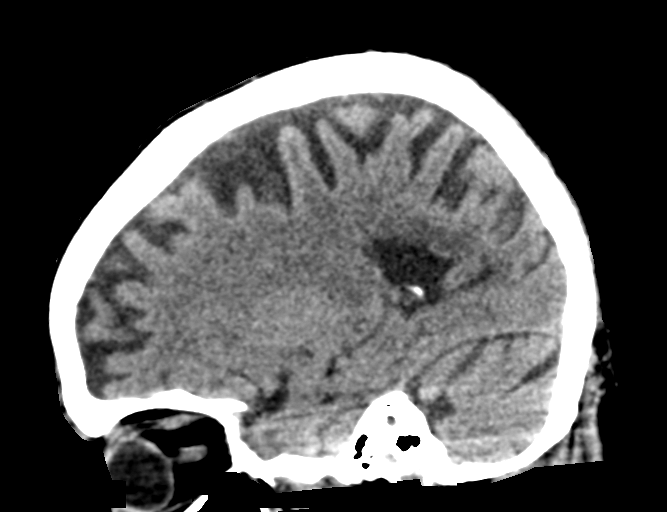

[15 of 46 positions shown; findings below may reference images not displayed]

FINDINGS: Brain: There is atrophy and chronic small vessel disease changes. No
acute intracranial abnormality. Specifically, no hemorrhage,
hydrocephalus, mass lesion, acute infarction, or significant
intracranial injury.

Vascular: No hyperdense vessel or unexpected calcification.

Skull: No acute calvarial abnormality.

Sinuses/Orbits: Visualized paranasal sinuses and mastoids clear.
Orbital soft tissues unremarkable.

Other: None
IMPRESSION: Atrophy, chronic microvascular disease.

No acute intracranial abnormality.

For

## 2019-11-29 IMAGING — DX DG FOOT COMPLETE 3+V*R*
3 series · 3 of 3 positions shown · non-contrast
Comparison: [DATE]

CLINICAL DATA: Right foot redness, swelling, necrotic 2nd toe

EXAM:
RIGHT FOOT COMPLETE - 3+ VIEW

[foot ap]
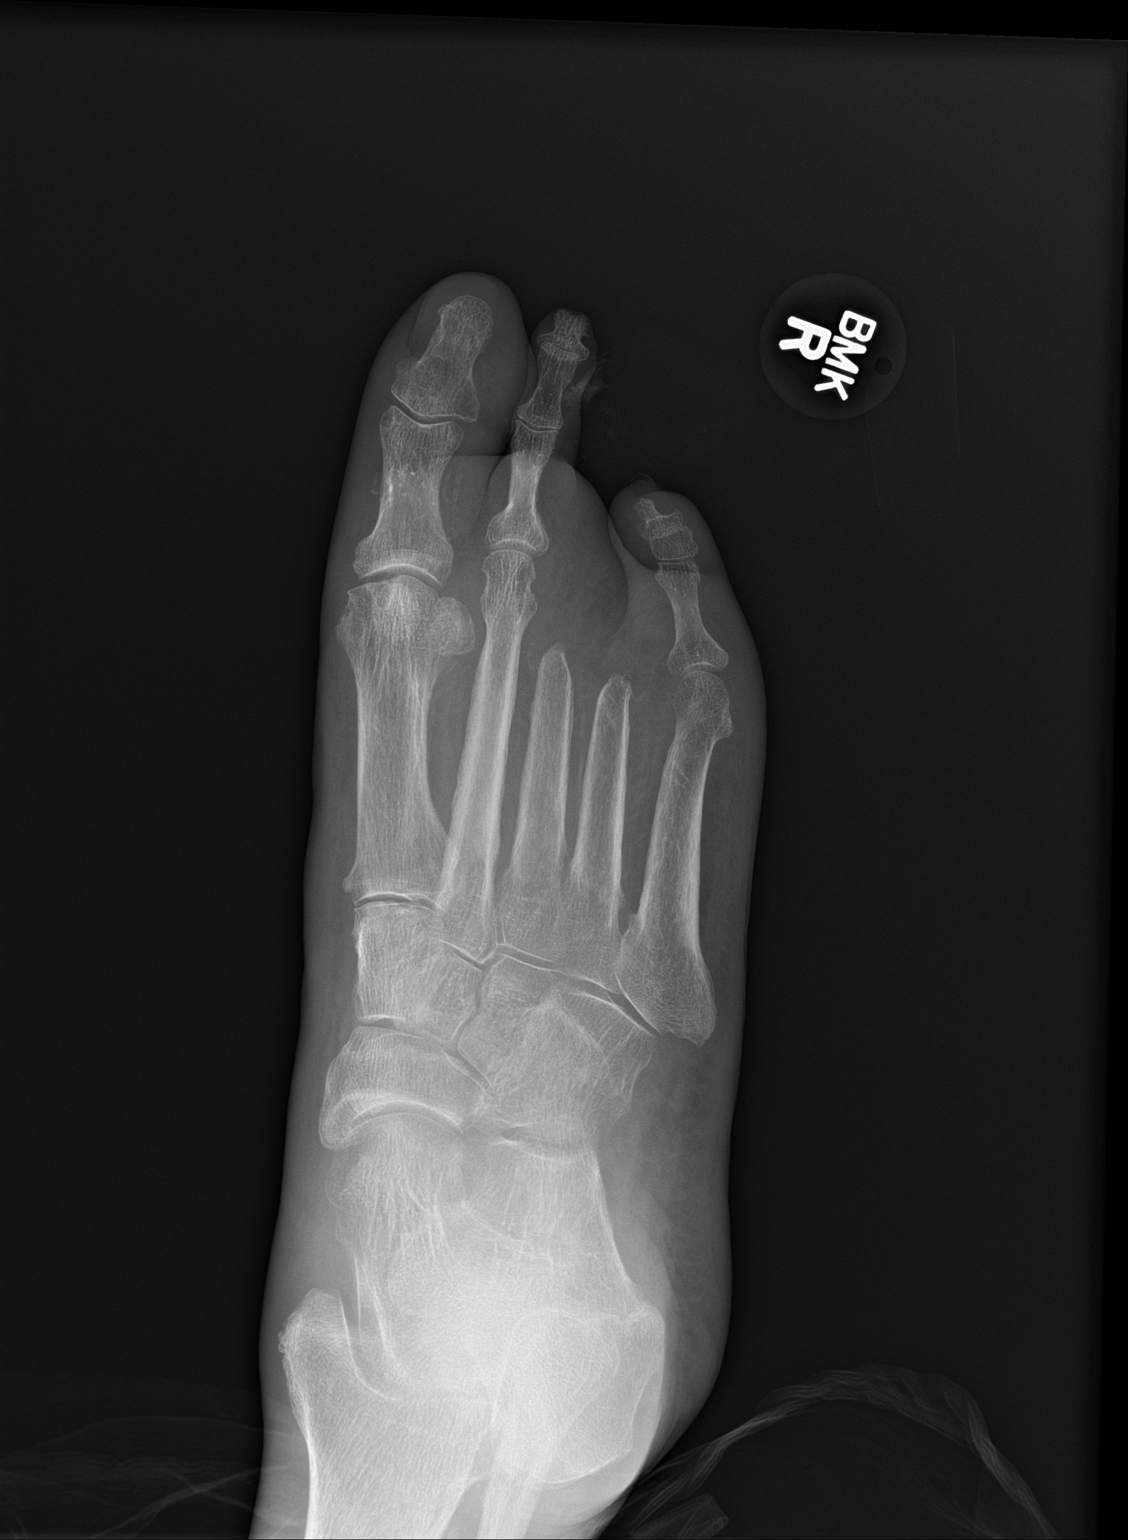

[foot obl]
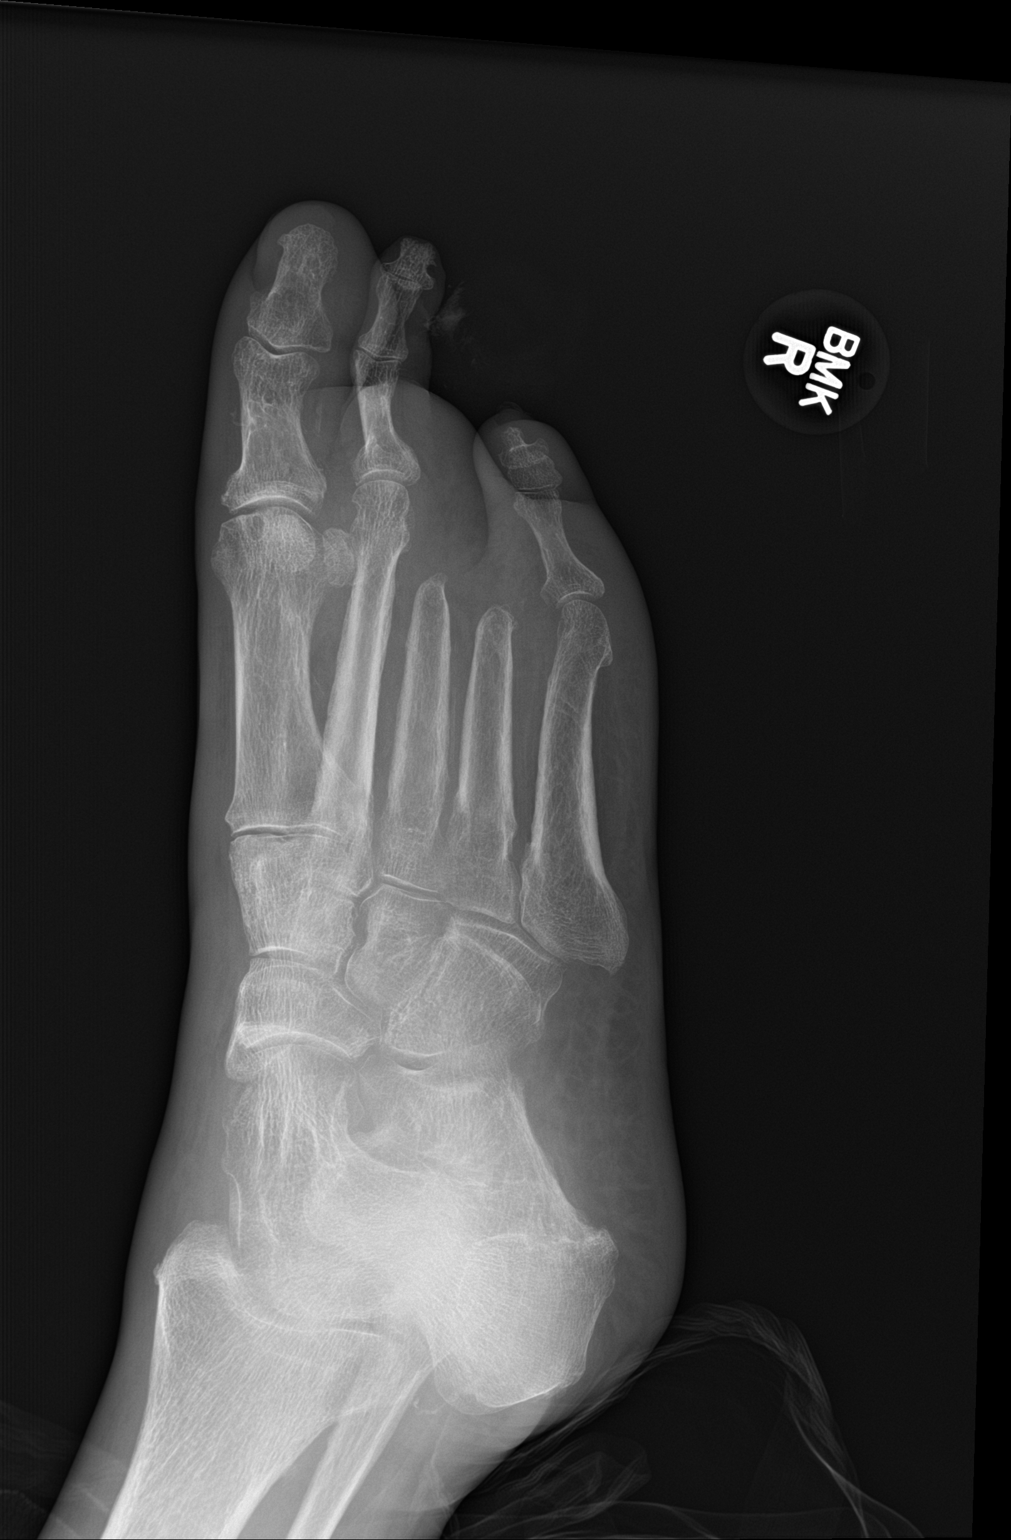

[foot lat]
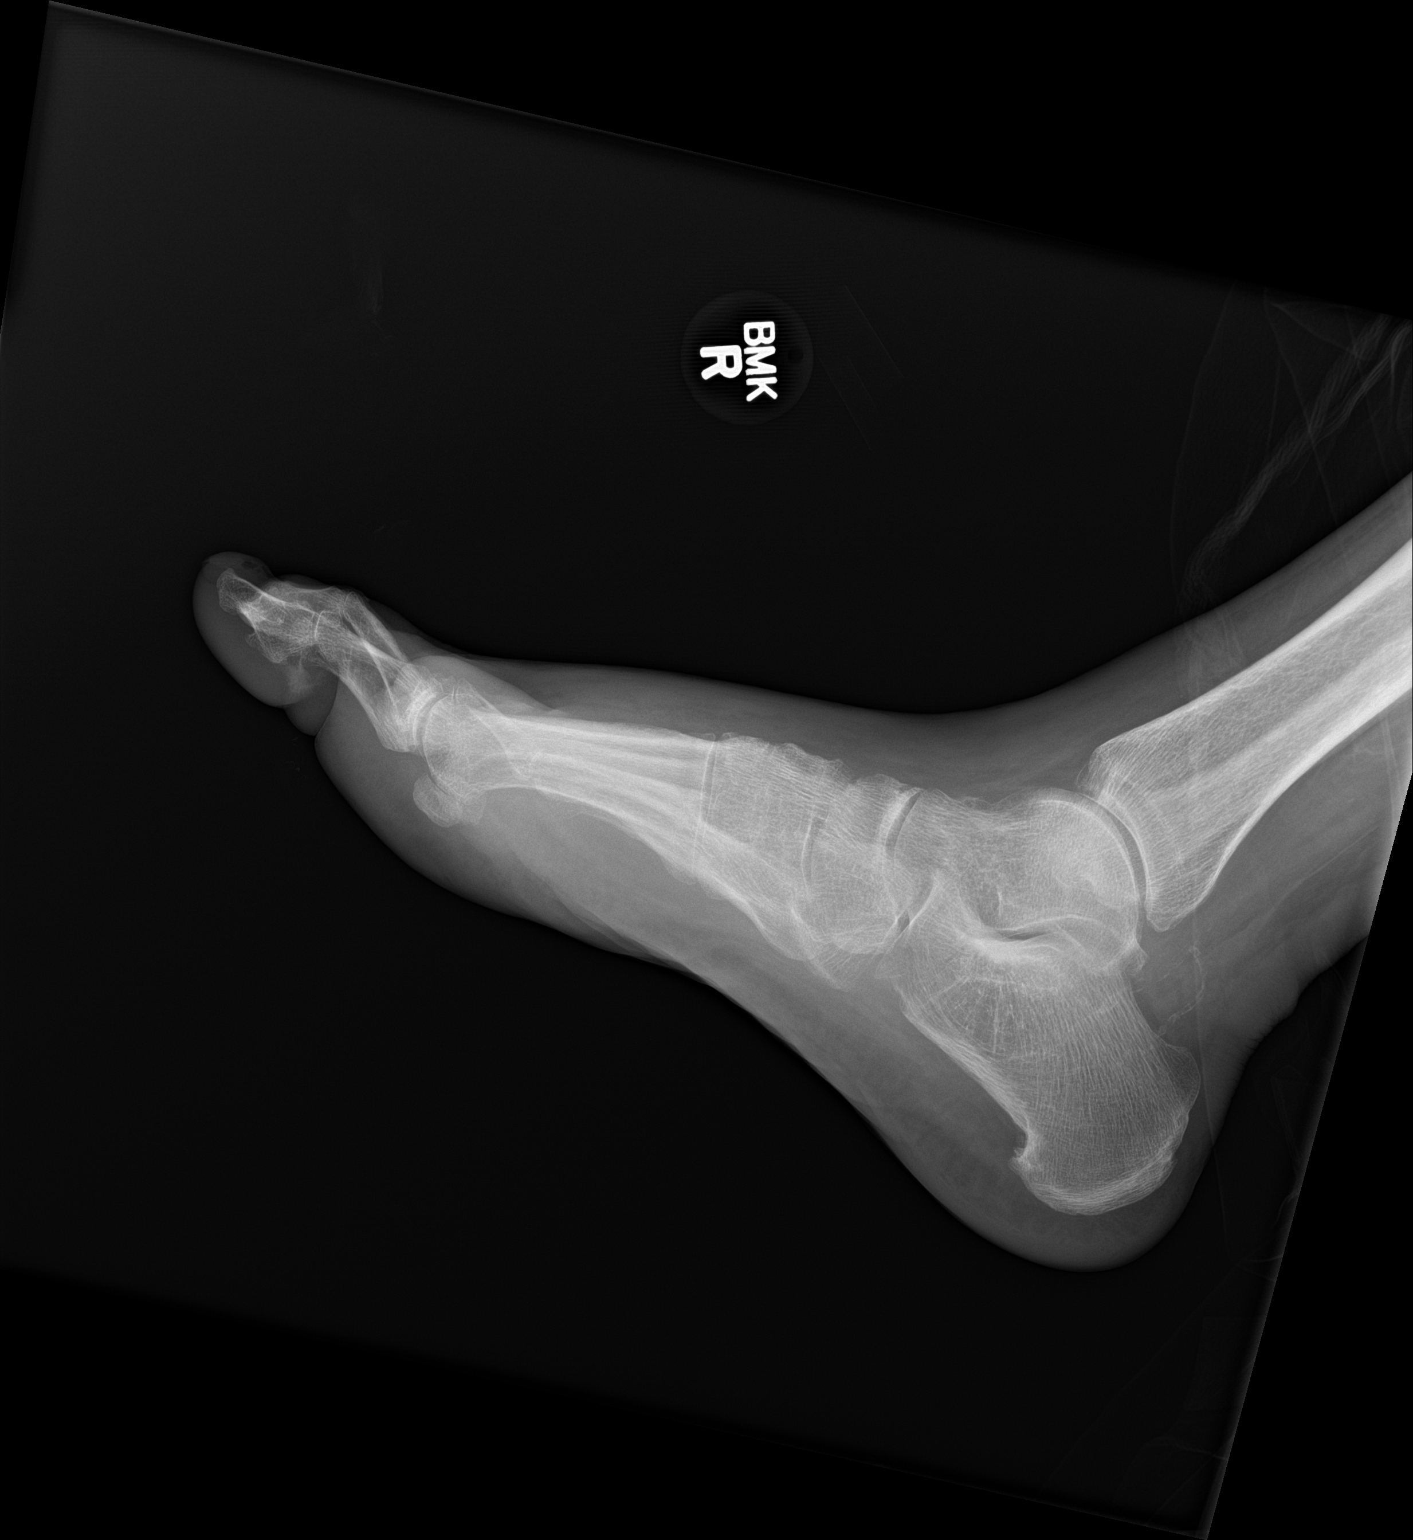

[3 of 3 positions shown; findings below may reference images not displayed]

FINDINGS: Prior transmetatarsal amputation of the 3rd and 4th toes. No bone
destruction to suggest active acute osteomyelitis. No acute bony
abnormality. Specifically, no fracture, subluxation, or dislocation.
IMPRESSION: No acute bony abnormality.

## 2019-11-29 MED ORDER — ACETAMINOPHEN 325 MG PO TABS: 650.0000 mg | ORAL_TABLET | ORAL | Status: DC | PRN

## 2019-11-29 MED ORDER — SODIUM CHLORIDE 0.9 % IV SOLN
2.0000 g | INTRAVENOUS | Status: DC
  Administered 2019-12-01: 2 g via INTRAVENOUS
  Filled 2019-11-29: qty 2

## 2019-11-29 MED ORDER — ACETAMINOPHEN 650 MG RE SUPP: 650.0000 mg | RECTAL | Status: DC | PRN

## 2019-11-29 MED ORDER — ATORVASTATIN CALCIUM 20 MG PO TABS
80.0000 mg | ORAL_TABLET | Freq: Every day | ORAL | Status: DC
  Administered 2019-11-30 – 2019-12-02 (×3): 80 mg via ORAL
  Filled 2019-11-29 (×3): qty 4

## 2019-11-29 MED ORDER — VANCOMYCIN HCL 500 MG/100ML IV SOLN
500.0000 mg | INTRAVENOUS | Status: DC
  Administered 2019-12-01: 01:00:00 500 mg via INTRAVENOUS
  Filled 2019-11-29: qty 100

## 2019-11-29 MED ORDER — METRONIDAZOLE IN NACL 5-0.79 MG/ML-% IV SOLN
500.0000 mg | Freq: Once | INTRAVENOUS | Status: AC
  Administered 2019-11-30: 500 mg via INTRAVENOUS
  Filled 2019-11-29: qty 100

## 2019-11-29 MED ORDER — VANCOMYCIN HCL IN DEXTROSE 1-5 GM/200ML-% IV SOLN
1000.0000 mg | Freq: Once | INTRAVENOUS | Status: DC
  Filled 2019-11-29: qty 200

## 2019-11-29 MED ORDER — CLOPIDOGREL BISULFATE 75 MG PO TABS
75.0000 mg | ORAL_TABLET | Freq: Every day | ORAL | Status: DC
  Administered 2019-11-30 – 2019-12-02 (×3): 75 mg via ORAL
  Filled 2019-11-29 (×3): qty 1

## 2019-11-29 MED ORDER — DONEPEZIL HCL 5 MG PO TABS
10.0000 mg | ORAL_TABLET | Freq: Every day | ORAL | Status: DC
  Administered 2019-11-30 – 2019-12-02 (×3): 10 mg via ORAL
  Filled 2019-11-29 (×3): qty 2

## 2019-11-29 MED ORDER — ASPIRIN EC 81 MG PO TBEC
81.0000 mg | DELAYED_RELEASE_TABLET | Freq: Every evening | ORAL | Status: DC
  Administered 2019-11-30 – 2019-12-01 (×2): 81 mg via ORAL
  Filled 2019-11-29 (×3): qty 1

## 2019-11-29 MED ORDER — OXYCODONE-ACETAMINOPHEN 5-325 MG PO TABS
1.0000 | ORAL_TABLET | ORAL | Status: DC | PRN
  Administered 2019-11-30 – 2019-12-02 (×6): 1 via ORAL
  Filled 2019-11-29 (×6): qty 1

## 2019-11-29 MED ORDER — INSULIN GLARGINE 100 UNIT/ML ~~LOC~~ SOLN
10.0000 [IU] | Freq: Every day | SUBCUTANEOUS | Status: DC
  Administered 2019-11-30 – 2019-12-01 (×3): 10 [IU] via SUBCUTANEOUS
  Filled 2019-11-29 (×4): qty 0.1

## 2019-11-29 MED ORDER — VANCOMYCIN HCL 1750 MG/350ML IV SOLN
1750.0000 mg | Freq: Once | INTRAVENOUS | Status: AC
  Administered 2019-11-30: 02:00:00 1750 mg via INTRAVENOUS
  Filled 2019-11-29: qty 350

## 2019-11-29 MED ORDER — ENOXAPARIN SODIUM 30 MG/0.3ML ~~LOC~~ SOLN
30.0000 mg | SUBCUTANEOUS | Status: DC
  Administered 2019-11-30 (×2): 30 mg via SUBCUTANEOUS
  Filled 2019-11-29 (×2): qty 0.3

## 2019-11-29 MED ORDER — ACETAMINOPHEN 160 MG/5ML PO SOLN
650.0000 mg | ORAL | Status: DC | PRN
  Filled 2019-11-29: qty 20.3

## 2019-11-29 MED ORDER — INSULIN ASPART 100 UNIT/ML ~~LOC~~ SOLN
0.0000 [IU] | Freq: Every day | SUBCUTANEOUS | Status: DC
  Administered 2019-11-30 – 2019-12-01 (×2): 2 [IU] via SUBCUTANEOUS
  Filled 2019-11-29 (×2): qty 1

## 2019-11-29 MED ORDER — STROKE: EARLY STAGES OF RECOVERY BOOK: Freq: Once | Status: DC

## 2019-11-29 MED ORDER — DULOXETINE HCL 30 MG PO CPEP: 30.0000 mg | ORAL_CAPSULE | Freq: Every day | ORAL | 1 refills | Status: DC

## 2019-11-29 MED ORDER — SODIUM CHLORIDE 0.9 % IV SOLN
2.0000 g | Freq: Once | INTRAVENOUS | Status: AC
  Administered 2019-11-30: 2 g via INTRAVENOUS
  Filled 2019-11-29: qty 2

## 2019-11-29 MED ORDER — INSULIN ASPART 100 UNIT/ML ~~LOC~~ SOLN
0.0000 [IU] | Freq: Three times a day (TID) | SUBCUTANEOUS | Status: DC
  Administered 2019-11-30 – 2019-12-02 (×4): 3 [IU] via SUBCUTANEOUS
  Filled 2019-11-29 (×5): qty 1

## 2019-11-29 NOTE — ED Notes (Signed)
Pt. Family member called and mentioned that Pt has pain on his right foot.
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
## 2019-11-29 NOTE — Progress Notes (Signed)
Anticoagulation monitoring(Lovenox):  86 yo male ordered Lovenox 40 mg Q24h  Filed Weights   11/29/19 1803  Weight: 74.8 kg (165 lb)   BMI    Lab Results  Component Value Date   CREATININE 1.75 (H) 11/29/2019   CREATININE 1.41 (H) 11/09/2019   CREATININE 1.61 (H) 10/08/2019   Estimated Creatinine Clearance: 28.8 mL/min (A) (by C-G formula based on SCr of 1.75 mg/dL (H)). Hemoglobin & Hematocrit     Component Value Date/Time   HGB 8.1 (L) 11/29/2019 1849   HCT 27.2 (L) 11/29/2019 1849     Per Protocol for Patient with estCrcl < 30 ml/min and BMI < 40, will transition to Lovenox 30 mg Q24h.
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
## 2019-11-29 NOTE — Progress Notes (Signed)
Please call patient to advise the following:   1) Stop Sertraline (Zoloft)  2) Start Cymbalta 30 mg daily   Goal - to help with pain control and improve treatment of depression  Please make sure that he has a follow-up with palliative care provider or myself within the next 4 weeks

## 2019-11-29 NOTE — ED Notes (Signed)
Assumed care of pt upon being roomed. AO x4, talking in full sentences with regular and unlabored breathing. Denies needs at this time. RN spoke with wife via phone for care update. Pt c/o pain in RLE, necrotic second digit noted with skin detortion to base of foot with redness, edema, and warmth. RN attempted IV access and unsuccessful. IV team paged, awaiting IV team for abx administration. [name] at bedside.

## 2019-11-29 NOTE — ED Notes (Signed)
Lab called due to patient being difficult stick

## 2019-11-29 NOTE — Progress Notes (Signed)
PHARMACY -  BRIEF ANTIBIOTIC NOTE   Pharmacy has received consult(s) for Vancomycin, Cefepime from an ED provider.  The patient's profile has been reviewed for ht/wt/allergies/indication/available labs.    One time order(s) placed for Vancomycin 1750 mg IV X 1 and Cefepime 2 gm IV X 1   Further antibiotics/pharmacy consults should be ordered by admitting physician if indicated.                       Thank you, Armen Waring D 11/29/2019  10:41 PM

## 2019-11-29 NOTE — ED Triage Notes (Signed)
Pt presents via [facility] with c/o altered mental status. Pt reports that around 10 am, pt had trouble getting words out. Pt reports improvement in symptoms but still having trouble finding words. Pt with no facial droop or other neuro defecits at this time. Pt alert and oriented x4. Pt hypertensive in triage.

## 2019-11-29 NOTE — ED Triage Notes (Signed)
Per [facility], patient woke up at 1500 and was aphasic and drooling.  No weakness, no facial droop.  Upon EMS arrival, initally patient was aphasic, but per report all symptoms resolved 2 minutes after EMS arrival.  VS wnl.

## 2019-11-30 ENCOUNTER — Ambulatory Visit: Payer: Medicare Other

## 2019-11-30 ENCOUNTER — Inpatient Hospital Stay: Payer: Medicare Other

## 2019-11-30 ENCOUNTER — Telehealth: Payer: Self-pay

## 2019-11-30 ENCOUNTER — Inpatient Hospital Stay: Admit: 2019-11-30 | Payer: Medicare Other

## 2019-11-30 ENCOUNTER — Telehealth: Payer: Self-pay | Admitting: Adult Health Nurse Practitioner

## 2019-11-30 DIAGNOSIS — N1832 Chronic kidney disease, stage 3b: Secondary | ICD-10-CM | POA: Diagnosis not present

## 2019-11-30 DIAGNOSIS — G63 Polyneuropathy in diseases classified elsewhere: Secondary | ICD-10-CM

## 2019-11-30 DIAGNOSIS — I739 Peripheral vascular disease, unspecified: Secondary | ICD-10-CM

## 2019-11-30 DIAGNOSIS — I70263 Atherosclerosis of native arteries of extremities with gangrene, bilateral legs: Secondary | ICD-10-CM | POA: Diagnosis not present

## 2019-11-30 DIAGNOSIS — E1122 Type 2 diabetes mellitus with diabetic chronic kidney disease: Secondary | ICD-10-CM

## 2019-11-30 DIAGNOSIS — N179 Acute kidney failure, unspecified: Secondary | ICD-10-CM

## 2019-11-30 DIAGNOSIS — L03115 Cellulitis of right lower limb: Secondary | ICD-10-CM | POA: Diagnosis not present

## 2019-11-30 DIAGNOSIS — I96 Gangrene, not elsewhere classified: Secondary | ICD-10-CM

## 2019-11-30 DIAGNOSIS — F0390 Unspecified dementia without behavioral disturbance: Secondary | ICD-10-CM | POA: Diagnosis not present

## 2019-11-30 DIAGNOSIS — E1159 Type 2 diabetes mellitus with other circulatory complications: Secondary | ICD-10-CM | POA: Diagnosis not present

## 2019-11-30 DIAGNOSIS — N183 Chronic kidney disease, stage 3 unspecified: Secondary | ICD-10-CM

## 2019-11-30 DIAGNOSIS — Z89421 Acquired absence of other right toe(s): Secondary | ICD-10-CM

## 2019-11-30 DIAGNOSIS — D631 Anemia in chronic kidney disease: Secondary | ICD-10-CM

## 2019-11-30 DIAGNOSIS — I2510 Atherosclerotic heart disease of native coronary artery without angina pectoris: Secondary | ICD-10-CM

## 2019-11-30 DIAGNOSIS — Z794 Long term (current) use of insulin: Secondary | ICD-10-CM

## 2019-11-30 LAB — CBC
HCT: 26.2 % — ABNORMAL LOW (ref 39.0–52.0)
Hemoglobin: 8.2 g/dL — ABNORMAL LOW (ref 13.0–17.0)
MCH: 25.6 pg — ABNORMAL LOW (ref 26.0–34.0)
MCHC: 31.3 g/dL (ref 30.0–36.0)
MCV: 81.9 fL (ref 80.0–100.0)
Platelets: 378 10*3/uL (ref 150–400)
RBC: 3.2 MIL/uL — ABNORMAL LOW (ref 4.22–5.81)
RDW: 15.8 % — ABNORMAL HIGH (ref 11.5–15.5)
WBC: 10.8 10*3/uL — ABNORMAL HIGH (ref 4.0–10.5)
nRBC: 0 % (ref 0.0–0.2)

## 2019-11-30 LAB — LIPID PANEL
Cholesterol: 94 mg/dL (ref 0–200)
HDL: 37 mg/dL — ABNORMAL LOW (ref >40)
LDL Cholesterol: 38 mg/dL (ref 0–99)
Total CHOL/HDL Ratio: 2.5 RATIO
Triglycerides: 97 mg/dL (ref <150)
VLDL: 19 mg/dL (ref 0–40)

## 2019-11-30 LAB — GLUCOSE, CAPILLARY
Glucose-Capillary: 114 mg/dL — ABNORMAL HIGH (ref 70–99)
Glucose-Capillary: 126 mg/dL — ABNORMAL HIGH (ref 70–99)
Glucose-Capillary: 156 mg/dL — ABNORMAL HIGH (ref 70–99)
Glucose-Capillary: 173 mg/dL — ABNORMAL HIGH (ref 70–99)
Glucose-Capillary: 250 mg/dL — ABNORMAL HIGH (ref 70–99)

## 2019-11-30 LAB — RESPIRATORY PANEL BY RT PCR (FLU A&B, COVID)
Influenza A by PCR: NEGATIVE
Influenza B by PCR: NEGATIVE
SARS Coronavirus 2 by RT PCR: NEGATIVE

## 2019-11-30 IMAGING — US US CAROTID DUPLEX BILAT
1 series · 13 of 24 positions shown · non-contrast
Comparison: None.

CLINICAL DATA: 89-year-old male with a history of TIA

EXAM:
BILATERAL CAROTID DUPLEX ULTRASOUND
TECHNIQUE: Gray scale imaging, color Doppler and duplex ultrasound were
performed of bilateral carotid and vertebral arteries in the neck.

[Series 1: us carotid bilateral · 13 of 68 slices shown]
[im 1/68]
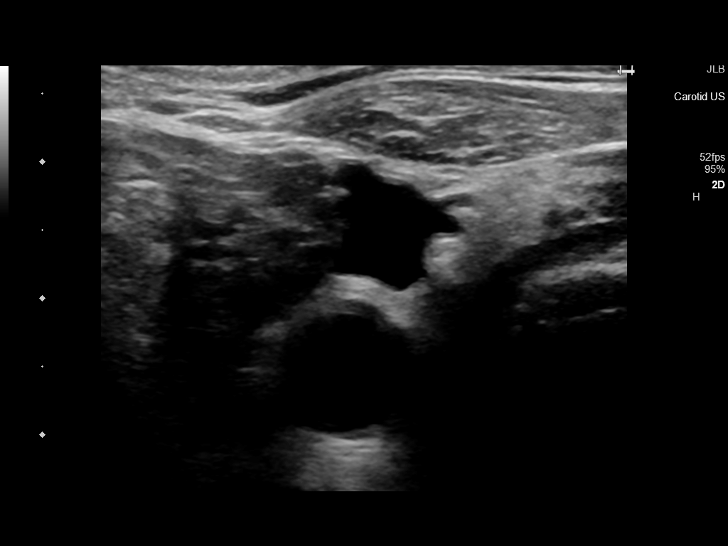
[im 6/68]
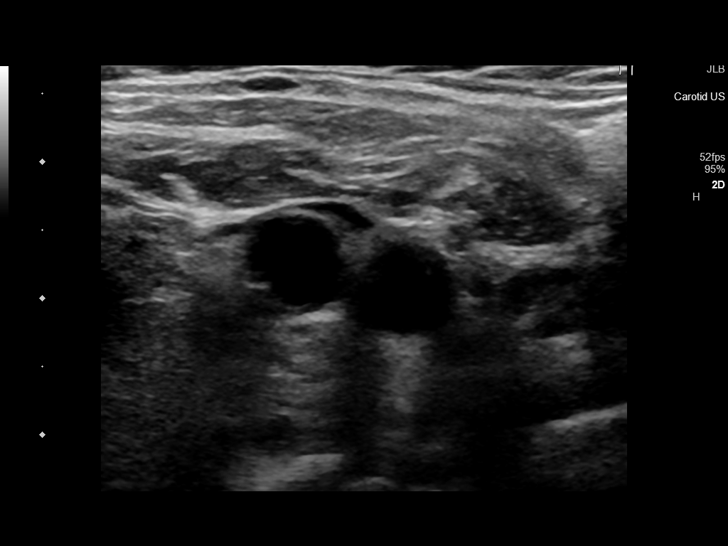
[im 12/68]
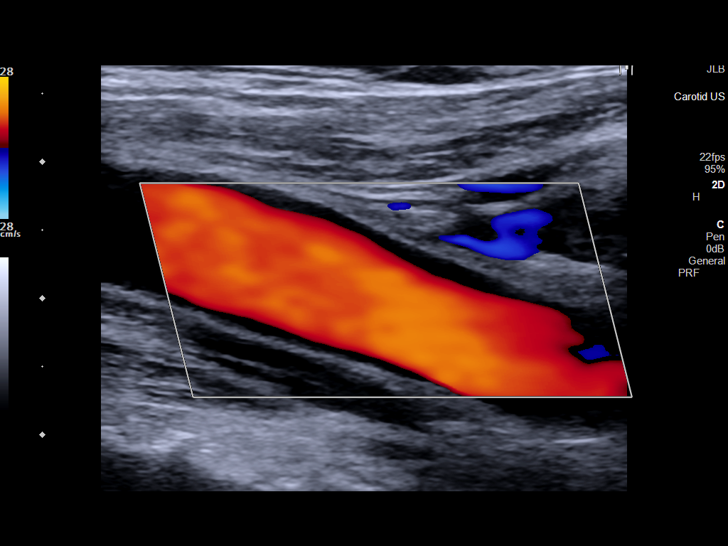
[im 18/68]
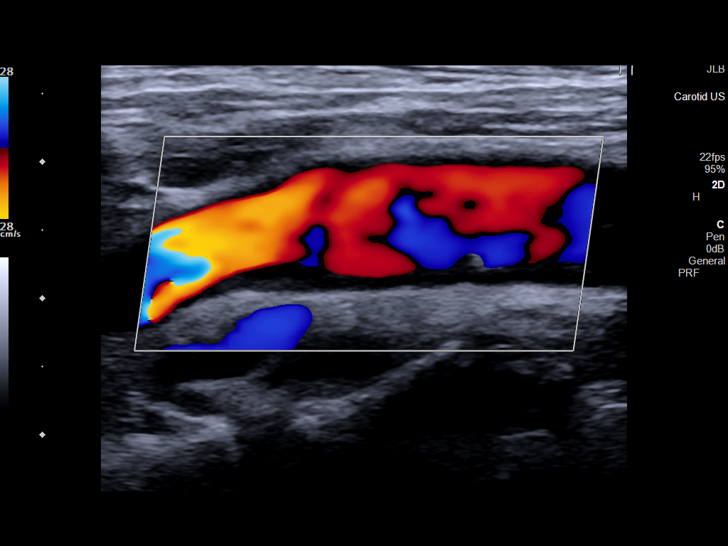
[im 24/68]
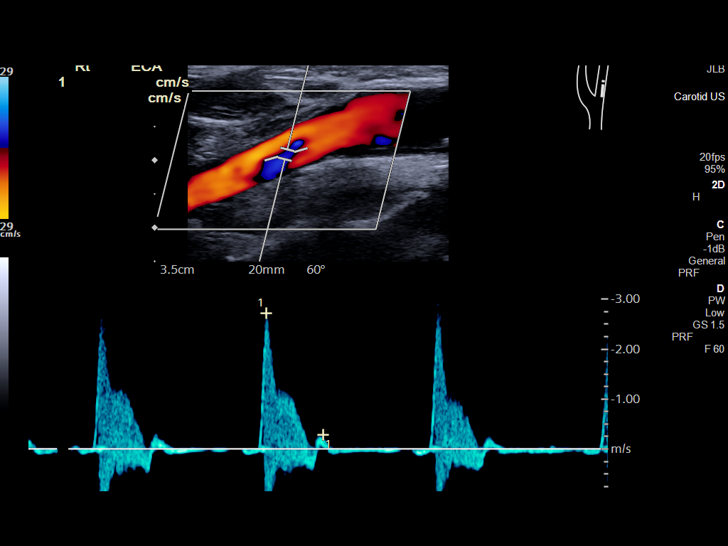
[im 30/68]
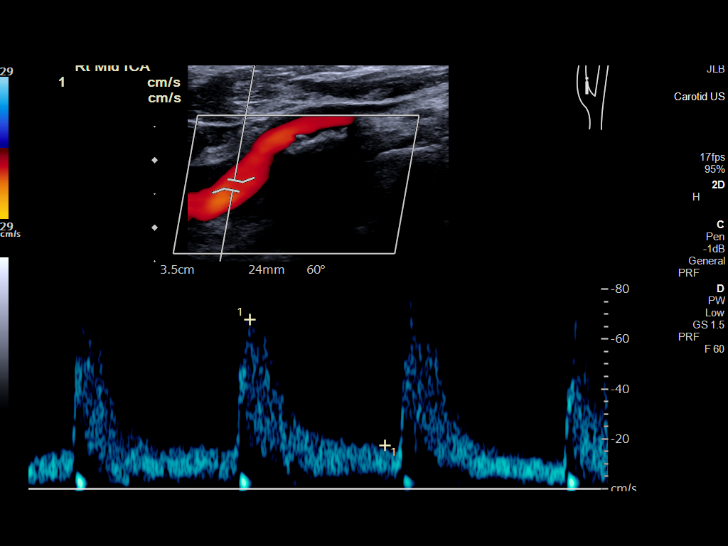
[im 35/68]
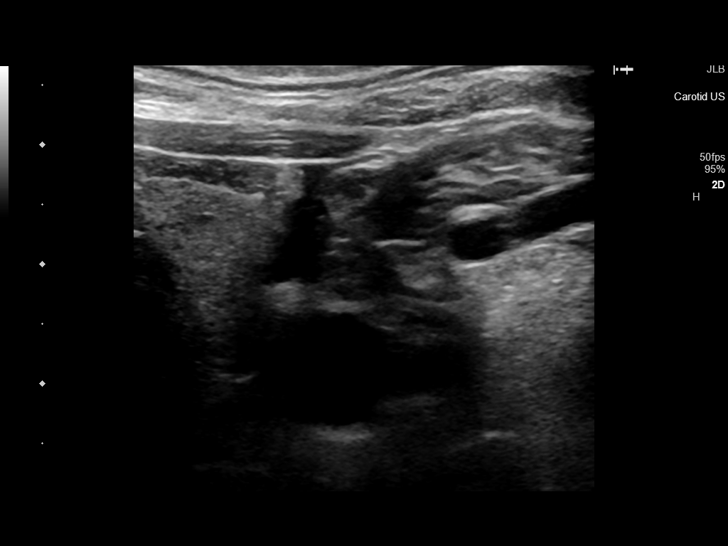
[im 38/68]
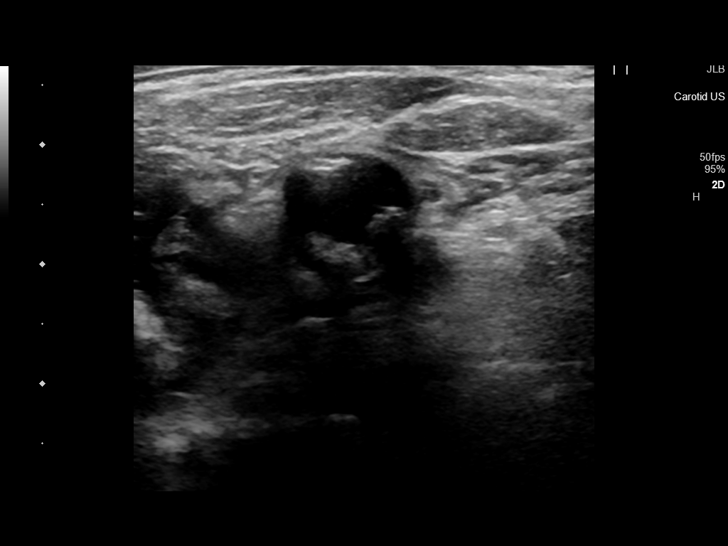
[im 44/68]
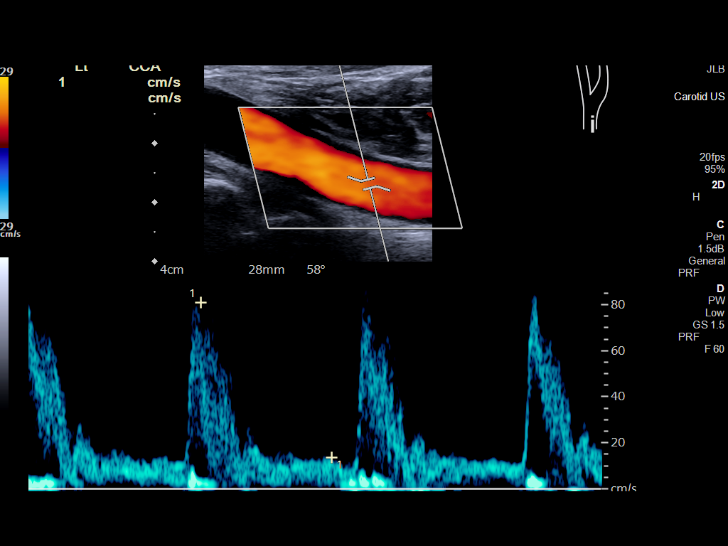
[im 50/68]
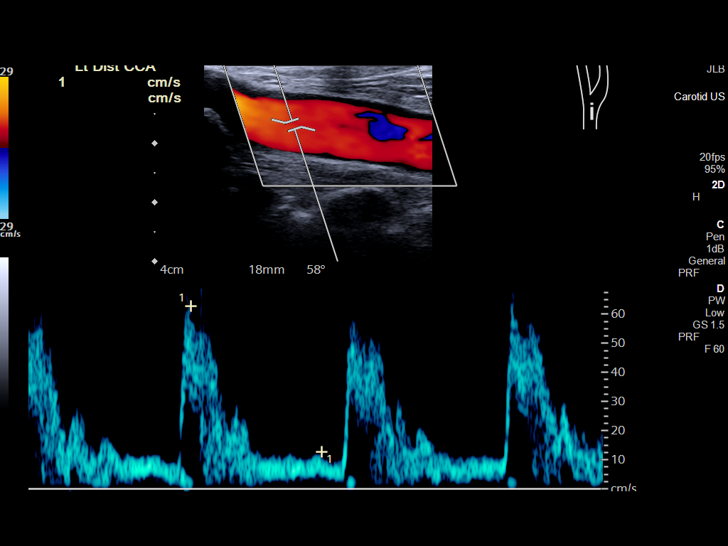
[im 56/68]
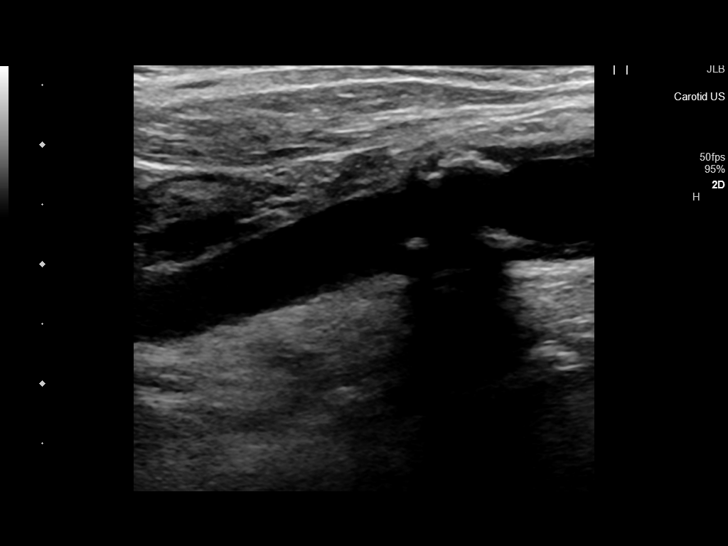
[im 62/68]
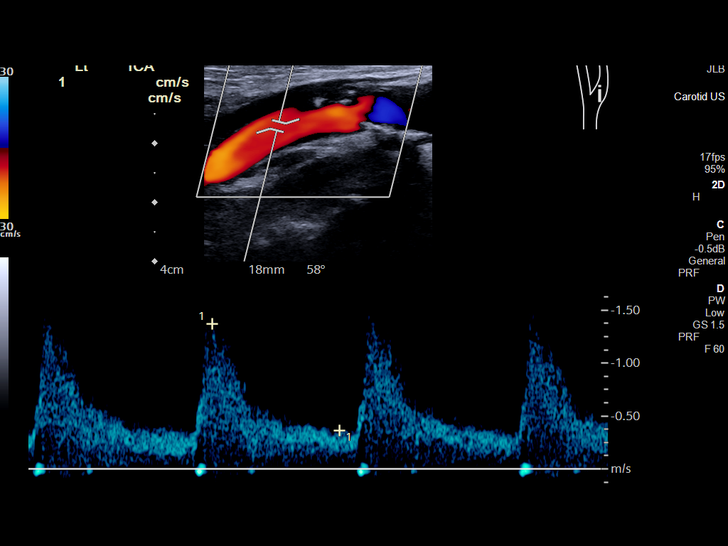
[im 68/68]
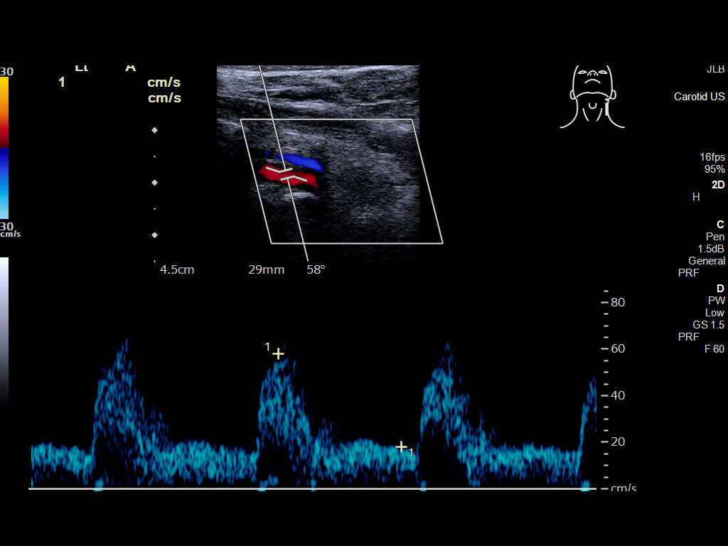

[13 of 24 positions shown; findings below may reference images not displayed]

FINDINGS: Criteria: Quantification of carotid stenosis is based on velocity
parameters that correlate the residual internal carotid diameter
with NASCET-based stenosis levels, using the diameter of the distal
internal carotid lumen as the denominator for stenosis measurement.

The following velocity measurements were obtained:

RIGHT

ICA:  Systolic 80 cm/sec, Diastolic 19 cm/sec

CCA:  82 cm/sec

SYSTOLIC ICA/CCA RATIO:

ECA:  251 cm/sec

LEFT

ICA:  Systolic 137 cm/sec, Diastolic 36 cm/sec

CCA:  71 cm/sec

SYSTOLIC ICA/CCA RATIO:

ECA:  306 cm/sec

Right Brachial SBP: Not acquired

Left Brachial SBP: Not acquired

RIGHT CAROTID ARTERY: No significant calcifications of the right
common carotid artery. Intermediate waveform maintained. Moderate
heterogeneous and partially calcified plaque at the right carotid
bifurcation. No significant lumen shadowing. Low resistance waveform
of the right ICA. No significant tortuosity.

RIGHT VERTEBRAL ARTERY: Antegrade flow with low resistance waveform.

LEFT CAROTID ARTERY: No significant calcifications of the left
common carotid artery. Intermediate waveform maintained. Moderate
heterogeneous and partially calcified plaque at the left carotid
bifurcation. No significant lumen shadowing. Low resistance waveform
of the left ICA. No significant tortuosity.

LEFT VERTEBRAL ARTERY:  Antegrade flow with low resistance waveform.
IMPRESSION: Color duplex indicates moderate heterogeneous and calcified plaque,
with no hemodynamically significant stenosis by duplex criteria in
the extracranial cerebrovascular circulation.

## 2019-11-30 MED ORDER — SODIUM CHLORIDE 0.9 % IV SOLN: INTRAVENOUS | Status: DC

## 2019-11-30 MED ORDER — GABAPENTIN 300 MG PO CAPS
300.0000 mg | ORAL_CAPSULE | Freq: Two times a day (BID) | ORAL | Status: DC
  Administered 2019-11-30 – 2019-12-02 (×4): 300 mg via ORAL
  Filled 2019-11-30 (×4): qty 1

## 2019-11-30 MED ORDER — LABETALOL HCL 5 MG/ML IV SOLN
5.0000 mg | Freq: Once | INTRAVENOUS | Status: AC
  Administered 2019-11-30: 5 mg via INTRAVENOUS
  Filled 2019-11-30: qty 4

## 2019-11-30 MED ORDER — LABETALOL HCL 5 MG/ML IV SOLN
10.0000 mg | INTRAVENOUS | Status: DC | PRN
  Administered 2019-11-30 – 2019-12-01 (×3): 10 mg via INTRAVENOUS
  Filled 2019-11-30 (×3): qty 4

## 2019-11-30 MED ORDER — ISOSORBIDE MONONITRATE ER 60 MG PO TB24
60.0000 mg | ORAL_TABLET | Freq: Every day | ORAL | Status: DC
  Administered 2019-11-30 – 2019-12-02 (×3): 60 mg via ORAL
  Filled 2019-11-30 (×3): qty 1

## 2019-11-30 MED ORDER — HYDRALAZINE HCL 20 MG/ML IJ SOLN
10.0000 mg | INTRAMUSCULAR | Status: DC | PRN
  Administered 2019-11-30: 10 mg via INTRAVENOUS
  Filled 2019-11-30: qty 1

## 2019-11-30 MED ORDER — FERROUS SULFATE 325 (65 FE) MG PO TABS
325.0000 mg | ORAL_TABLET | Freq: Every day | ORAL | Status: DC
  Administered 2019-11-30 – 2019-12-02 (×3): 325 mg via ORAL
  Filled 2019-11-30 (×3): qty 1

## 2019-11-30 MED ORDER — DOXAZOSIN MESYLATE 4 MG PO TABS
4.0000 mg | ORAL_TABLET | Freq: Every day | ORAL | Status: DC
  Administered 2019-11-30 – 2019-12-02 (×3): 4 mg via ORAL
  Filled 2019-11-30 (×3): qty 1

## 2019-11-30 MED ORDER — FINASTERIDE 5 MG PO TABS
5.0000 mg | ORAL_TABLET | Freq: Every day | ORAL | Status: DC
  Administered 2019-11-30 – 2019-12-02 (×3): 5 mg via ORAL
  Filled 2019-11-30 (×3): qty 1

## 2019-11-30 MED ORDER — INFLUENZA VAC A&B SA ADJ QUAD 0.5 ML IM PRSY
0.5000 mL | PREFILLED_SYRINGE | INTRAMUSCULAR | Status: AC
  Administered 2019-12-02: 12:00:00 0.5 mL via INTRAMUSCULAR
  Filled 2019-11-30 (×2): qty 0.5

## 2019-11-30 MED ORDER — DULOXETINE HCL 30 MG PO CPEP
30.0000 mg | ORAL_CAPSULE | Freq: Every day | ORAL | Status: DC
  Administered 2019-11-30 – 2019-12-02 (×3): 30 mg via ORAL
  Filled 2019-11-30 (×3): qty 1

## 2019-11-30 NOTE — ED Notes (Signed)
SVP 200s, MD Damita Dunnings made aware. IV team at bedside. Awaiting IV access to administer meds. AOx4

## 2019-11-30 NOTE — Progress Notes (Signed)
   11/30/19 1017  Notify: Provider  Provider Name/Title Bonner Puna  Date Provider Notified 11/30/19  Time Provider Notified [phone]  Notification Type Page  Notification Reason Other (Comment) (BP elevated)  Response No new orders  Date of Provider Response 11/30/19  Time of Provider Response [phone]  Document  Patient Outcome Stabilized after interventions  Progress note created (see row info) Yes

## 2019-11-30 NOTE — ED Notes (Signed)
Report called to receiving RN on 1C.

## 2019-11-30 NOTE — Progress Notes (Addendum)
PROGRESS NOTE  Peter [name]  VEH:[id number] DOB: 03-29-1930 DOA: 11/29/2019 PCP: Lesleigh Noe, MD  Brief Narrative: Peter [name] is an 86 y.o. male with a history of severe PVD s/p several stent angioplasties, s/p multiple toe amputations, as well as TIA, CAD, T2DM, stage IIIb CKD, iron deficiency and AOCD, and mild dementia who is followed by home hospice and presented to the ED following an episode of AMS. CT head showed chronic microvascular changes without acute abnormality. Vascular surgery was consulted for right 2nd toe gangrene. They recommended revascularization though the patient has declined. Podiatry is consulted for amputation and the patient is continued on antibiotics. TIA work up is ongoing.   Assessment & Plan: Active Problems:   DM (diabetes mellitus) type II controlled with renal manifestation (HCC)   Chronic kidney disease, stage 3b (HCC)   Peripheral neuropathy   History of amputation of lesser toe of right foot (HCC)   Hx of malignant neoplasm of prostate   Coronary artery disease involving native coronary artery of native heart without angina pectoris   Peripheral vascular disease, unspecified (HCC)   Anemia due to stage 3b chronic kidney disease (HCC)   Cellulitis of right lower leg   TIA (transient ischemic attack)   Necrotic toes (HCC)   Atherosclerosis of native arteries of extremities with gangrene, bilateral legs (HCC)   AKI (acute kidney injury) ([place])  AMS, history of TIA: CT head nonacute in ED.Drooling and slurred speech resolved on arrival to ED.  - Carotid U/S pending. Mentation seems to have remained normal.   Severe PVD complicating right 2nd toe dry gangrene, right foot cellulitis. Right foot XR without bony abnormality. He's afebrile without leukocytosis and very mild changes to suggest cellulitis at admission which is improving. - LE U/S demonstrated occlusion of the right fem-pop bypass graft. No DVT. - Has declined revascularization  -  Podiatry consulted for amputation, Dr. Cleda Mccreedy to see this PM. - Continue antibiotics given cellulitis at admission. May not need to continue these depending on clinical course/interventions. - Continue antiplatelets, statin per vascular surgery recommendations. LDL is 38.   Iron deficiency anemia requiring recurrent infusions, also an element of anemia of chronic disease:  - Pt having dark stools (he is on iron), remains hemodynamically stable. Will check FOBT and CBC - Scheduled for endoscopy per family. - Continue po iron. Indices are slightly microcytic and Hgb is about baseline ~8.   HTN, CAD:  - Continue imdur to avoid angina. ?not on beta blocker, not on allergies. antiplatelets and statin as above. - Hold HCTZ, norvasc, and losartan for permissive HTN for now.   T2DM: Well-controlled with HbA1c 6%.  - Continue insulin to maintain tight control.   Stage IIIb CKD: SCr modestly up from baseline (1.75 from ~1.6) - Monitor in AM. - Avoid nephrotoxins  BPH:  - Continue cardura, finasteride  Dementia: Mild.  - Continue home medications  DVT prophylaxis: Lovenox 30mg  Code Status: DNR Family Communication: None at bedside this morning. Spoke with daughter by phone. Disposition Plan:  Status is: Inpatient  Remains inpatient appropriate because:Inpatient level of care appropriate due to severity of illness  Dispo: The patient is from: Home              Anticipated d/c is to: Home              Anticipated d/c date is: 2 days              Patient currently is not medically stable  to d/c.  Consultants:   Vascular surgery  Podiatry  Procedures:   None  Antimicrobials:  Vancomycin, cefepime, flagyl   Subjective: Having no pain around the right toe/foot. No numbness or weakness. No fever. No bleeding noted.  Objective: Vitals:   11/30/19 0802 11/30/19 0807 11/30/19 1008 11/30/19 1218  BP: (!) 216/64 (!) 211/66 (!) 187/46 (!) 103/44  Pulse: (!) 59 (!) 59 63 (!) 55    Resp: 18  16 20   Temp: 98.3 F (36.8 C)  97.8 F (36.6 C) 98.2 F (36.8 C)  TempSrc: Oral  Oral Oral  SpO2: 99% 100% 99% 100%  Weight:      Height:        Intake/Output Summary (Last 24 hours) at 11/30/2019 1429 Last data filed at 11/30/2019 1300 Gross per 24 hour  Intake 350 ml  Output 150 ml  Net 200 ml   Filed Weights   11/29/19 1803  Weight: 74.8 kg    Gen: Nontoxic elderly male in no distress  Pulm: Non-labored breathing. Clear to auscultation bilaterally.  CV: Regular rate and rhythm. No murmur, rub, or gallop. No JVD GI: Abdomen soft, non-tender, non-distended, with normoactive bowel sounds. No organomegaly or masses felt. Ext: Weakly palpable DP pulses, some toes surgically absent. gangrenous, nearly autoamputated right 2nd toe. Very minimal swelling proximally which is not significantly tender.  Skin: As above Neuro: Alert and oriented. No focal neurological deficits. Psych: Judgement and insight appear normal. Mood & affect appropriate.   Data Reviewed: I have personally reviewed following labs and imaging studies  CBC: Recent Labs  Lab 11/29/19 1849  WBC 9.1  NEUTROABS 7.5  HGB 8.1*  HCT 27.2*  MCV 84.0  PLT 098*   Basic Metabolic Panel: Recent Labs  Lab 11/29/19 1849  NA 139  K 4.7  CL 103  CO2 23  GLUCOSE 162*  BUN 44*  CREATININE 1.75*  CALCIUM 8.6*   GFR: Estimated Creatinine Clearance: 28.8 mL/min (A) (by C-G formula based on SCr of 1.75 mg/dL (H)). Liver Function Tests: Recent Labs  Lab 11/29/19 1849  AST 23  ALT 26  ALKPHOS 91  BILITOT 0.7  PROT 7.2  ALBUMIN 3.6   No results for input(s): LIPASE, AMYLASE in the last 168 hours. No results for input(s): AMMONIA in the last 168 hours. Coagulation Profile: Recent Labs  Lab 11/29/19 1849  INR 1.1   Cardiac Enzymes: No results for input(s): CKTOTAL, CKMB, CKMBINDEX, TROPONINI in the last 168 hours. BNP (last 3 results) No results for input(s): PROBNP in the last 8760  hours. HbA1C: No results for input(s): HGBA1C in the last 72 hours. CBG: Recent Labs  Lab 11/30/19 0017 11/30/19 0816 11/30/19 1229  GLUCAP 156* 114* 126*   Lipid Profile: Recent Labs    11/30/19 0545  CHOL 94  HDL 37*  LDLCALC 38  TRIG 97  CHOLHDL 2.5   Thyroid Function Tests: No results for input(s): TSH, T4TOTAL, FREET4, T3FREE, THYROIDAB in the last 72 hours. Anemia Panel: No results for input(s): VITAMINB12, FOLATE, FERRITIN, TIBC, IRON, RETICCTPCT in the last 72 hours. Urine analysis:    Component Value Date/Time   COLORURINE YELLOW 04/18/2014 1426   APPEARANCEUR Clear 03/31/2019 1142   LABSPEC 1.009 04/18/2014 1426   PHURINE 6.5 04/18/2014 1426   GLUCOSEU Negative 03/31/2019 1142   HGBUR NEGATIVE 04/18/2014 1426   BILIRUBINUR Negative 03/31/2019 1142   KETONESUR NEGATIVE 04/18/2014 1426   PROTEINUR 3+ (A) 03/31/2019 1142   PROTEINUR NEGATIVE 04/18/2014 1426  UROBILINOGEN 0.2 04/18/2014 1426   NITRITE Negative 03/31/2019 1142   NITRITE NEGATIVE 04/18/2014 1426   LEUKOCYTESUR Negative 03/31/2019 1142   Recent Results (from the past 240 hour(s))  Respiratory Panel by RT PCR (Flu A&B, Covid) - Nasopharyngeal Swab     Status: None   Collection Time: 11/29/19 11:31 PM   Specimen: Nasopharyngeal Swab  Result Value Ref Range Status   SARS Coronavirus 2 by RT PCR NEGATIVE NEGATIVE Final    Comment: (NOTE) SARS-CoV-2 target nucleic acids are NOT DETECTED.  The SARS-CoV-2 RNA is generally detectable in upper respiratoy specimens during the acute phase of infection. The lowest concentration of SARS-CoV-2 viral copies this assay can detect is 131 copies/mL. A negative result does not preclude SARS-Cov-2 infection and should not be used as the sole basis for treatment or other patient management decisions. A negative result may occur with  improper specimen collection/handling, submission of specimen other than nasopharyngeal swab, presence of viral mutation(s)  within the areas targeted by this assay, and inadequate number of viral copies (<131 copies/mL). A negative result must be combined with clinical observations, patient history, and epidemiological information. The expected result is Negative.  Fact Sheet for Patients:  PinkCheek.be  Fact Sheet for Healthcare Providers:  GravelBags.it  This test is no t yet approved or cleared by the [place] FDA and  has been authorized for detection and/or diagnosis of SARS-CoV-2 by FDA under an Emergency Use Authorization (EUA). This EUA will remain  in effect (meaning this test can be used) for the duration of the COVID-19 declaration under Section 564(b)(1) of the Act, 21 U.S.C. section 360bbb-3(b)(1), unless the authorization is terminated or revoked sooner.     Influenza A by PCR NEGATIVE NEGATIVE Final   Influenza B by PCR NEGATIVE NEGATIVE Final    Comment: (NOTE) The Xpert Xpress SARS-CoV-2/FLU/RSV assay is intended as an aid in  the diagnosis of influenza from Nasopharyngeal swab specimens and  should not be used as a sole basis for treatment. Nasal washings and  aspirates are unacceptable for Xpert Xpress SARS-CoV-2/FLU/RSV  testing.  Fact Sheet for Patients: PinkCheek.be  Fact Sheet for Healthcare Providers: GravelBags.it  This test is not yet approved or cleared by the [place] FDA and  has been authorized for detection and/or diagnosis of SARS-CoV-2 by  FDA under an Emergency Use Authorization (EUA). This EUA will remain  in effect (meaning this test can be used) for the duration of the  Covid-19 declaration under Section 564(b)(1) of the Act, 21  U.S.C. section 360bbb-3(b)(1), unless the authorization is  terminated or revoked. Performed at [facility], [address], [place], [place] [address]       Radiology Studies: CT HEAD WO  CONTRAST  Result Date: 11/29/2019 CLINICAL DATA:  History of prostate cancer.  TIA. EXAM: CT HEAD WITHOUT CONTRAST TECHNIQUE: Contiguous axial images were obtained from the base of the skull through the vertex without intravenous contrast. COMPARISON:  None. FINDINGS: Brain: There is atrophy and chronic small vessel disease changes. No acute intracranial abnormality. Specifically, no hemorrhage, hydrocephalus, mass lesion, acute infarction, or significant intracranial injury. Vascular: No hyperdense vessel or unexpected calcification. Skull: No acute calvarial abnormality. Sinuses/Orbits: Visualized paranasal sinuses and mastoids clear. Orbital soft tissues unremarkable. Other: None IMPRESSION: Atrophy, chronic microvascular disease. No acute intracranial abnormality. For Electronically Signed   By: Rolm Baptise M.D.   On: 11/29/2019 18:42   US Venous Img Lower Unilateral Right  Result Date: 11/30/2019 CLINICAL DATA:  Redness and swelling in the right foot tonight. Necrotic second toe. History of pain, edema, color changes, prostate cancer, anticoagulation therapy. EXAM: Right LOWER EXTREMITY VENOUS DOPPLER ULTRASOUND TECHNIQUE: Gray-scale sonography with compression, as well as color and duplex ultrasound, were performed to evaluate the deep venous system(s) from the level of the common femoral vein through the popliteal and proximal calf veins. COMPARISON:  None. FINDINGS: VENOUS Normal compressibility of the common femoral, superficial femoral, and popliteal veins, as well as the visualized calf veins. Visualized portions of profunda femoral vein and great saphenous vein unremarkable. No filling defects to suggest DVT on grayscale or color Doppler imaging. Doppler waveforms show normal direction of venous flow, normal respiratory plasticity and response to augmentation. Limited views of the contralateral common femoral vein are unremarkable. OTHER Incidental finding of what appears to be a right femoral  to popliteal arterial bypass graft. The arterial system was not assessed in its entirety on this venous study, but there is evidence of graft occlusion or thrombosis. Limitations: none IMPRESSION: 1. No evidence of deep venous thrombosis in the right lower extremity. 2. Incidental finding of what appears to be an occluded right femoral to popliteal arterial bypass graft. Electronically Signed   By: Lucienne Capers M.D.   On: 11/30/2019 00:14   DG Foot Complete Right  Result Date: 11/29/2019 CLINICAL DATA:  Right foot redness, swelling, necrotic 2nd toe EXAM: RIGHT FOOT COMPLETE - 3+ VIEW COMPARISON:  10/06/2019 FINDINGS: Prior transmetatarsal amputation of the 3rd and 4th toes. No bone destruction to suggest active acute osteomyelitis. No acute bony abnormality. Specifically, no fracture, subluxation, or dislocation. IMPRESSION: No acute bony abnormality. Electronically Signed   By: Rolm Baptise M.D.   On: 11/29/2019 22:45    Scheduled Meds: .  stroke: mapping our early stages of recovery book   Does not apply Once  . aspirin EC  81 mg Oral QPM  . atorvastatin  80 mg Oral Daily  . clopidogrel  75 mg Oral Daily  . donepezil  10 mg Oral Daily  . enoxaparin (LOVENOX) injection  30 mg Subcutaneous Q24H  . insulin aspart  0-15 Units Subcutaneous TID WC  . insulin aspart  0-5 Units Subcutaneous QHS  . insulin glargine  10 Units Subcutaneous QHS  . isosorbide mononitrate  60 mg Oral Daily   Continuous Infusions: . [START ON 12/01/2019] ceFEPime (MAXIPIME) IV    . [START ON 12/01/2019] vancomycin       LOS: 1 day   Time spent: 25 minutes.  Patrecia Pour, MD [facility] [email or web address] 11/30/2019, 2:29 PM

## 2019-11-30 NOTE — Plan of Care (Signed)

## 2019-11-30 NOTE — Progress Notes (Signed)
   11/30/19 0400  Assess: MEWS Score  BP (!) 215/67  Pulse Rate 67  ECG Heart Rate 68  Resp 20  Level of Consciousness Alert  SpO2 99 %  Assess: if the MEWS score is Yellow or Red  Were vital signs taken at a resting state? Yes  Focused Assessment No change from prior assessment  Early Detection of Sepsis Score *See Row Information* Low  MEWS guidelines implemented *See Row Information* No, previously yellow, continue vital signs every 4 hours  Treat  MEWS Interventions Other (Comment) (continue to monitor)  Pain Scale 0-10  Pain Score 0  Document  Patient Outcome Stabilized after interventions  Progress note created (see row info) Yes

## 2019-11-30 NOTE — Progress Notes (Signed)
[facility] hospital Liaison note:  Patient is currently followed by [facility] community Palliative program at home. TOC Telford Nab made aware.  Will follow for discharge disposition.  Flo Shanks BSN, RN, [place] [phone]

## 2019-11-30 NOTE — Progress Notes (Signed)
OT Cancellation Note  Patient Details Name: Peter [name] MRN: [id number] DOB: 02-Sep-1930   Cancelled Treatment:    Reason Eval/Treat Not Completed: Patient at procedure or test/ unavailable;Other (comment). OT attempted x2 to see pt for evaluation this date. Upon initial attempt, pt noted to be OTF for imaging. On second attempt later in the PM, pt with SLP for evaluation. Will hold OT eval and re-attempt next date as available and pt medically appropriate for OT eval.   Shara Blazing, M.S., OTR/L Ascom: [phone] 11/30/19, 3:05 PM

## 2019-11-30 NOTE — Telephone Encounter (Signed)
Spoke to pts wife (DPR) to relay prescription change from Dr. Einar Pheasant. Wife notified me that pt is in the hospital. She will pick up new Rx from Dr. Einar Pheasant.

## 2019-11-30 NOTE — Progress Notes (Signed)
Patient ID: Peter [name], male   DOB: 01-16-1931, 86 y.o.   MRN: [id number] Patient was seen again in consultation about possible surgery for tomorrow as well as with one of his daughters in person as well as another daughter and his wife by phone.  I also was able to speak with Dr. Delana Meyer in person earlier today about the patient and his circulatory status.  At this point since the gangrenous toe appears dry with no significant cellulitis I would recommend that we hold off on any surgical intervention or amputation due to the high likelihood of this not healing and if it does experience healing challenges he would most likely end up with an above-knee amputation.  Recommended that we continue with Betadine dressings to the gangrenous toe as well as the ulcerative area on the side of the fifth toe.  Daughter was instructed on how to perform this.  We will dispense an OrthoWedge postoperative surgical shoe so that he can maintain weight only on the heel.  Physical therapy can work with him for heel weightbearing in the shoe only.  Would recommend that he is discharged with some antibiotic coverage.  Discussed with the patient and his daughter that I will plan on following up with him in a couple of weeks outpatient.  At this point we will cancel his consent form and n.p.o. orders

## 2019-11-30 NOTE — Telephone Encounter (Signed)
Spoke with patient's daughter.  Patient is currently in the hospital for [place].  Have reached out to hospital liaisons to follow. Laurie Penado K. Olena Heckle NP

## 2019-11-30 NOTE — Progress Notes (Signed)
SLP Cancellation Note  Patient Details Name: Peter [name] MRN: [id number] DOB: 1930-03-09   Cancelled treatment:       Reason Eval/Treat Not Completed: SLP screened, no needs identified, will sign off (chart reviewed; consulted NSG, met w/ pt). Pt denied any difficulty swallowing and is currently on a mech soft/regular diet(cut meats, moist foods for ease of eating); tolerates swallowing pills w/ water per NSG. Pt drank ~4ozs of thin liquids via straw while in room during Screening. Pt conversed in conversation w/out overt, gross deficits noted; pt denied any new speech-language deficits of difficulty talking w/ his Nurse, family. Pt read the menu and chose food items w/ this SLP for his late Lunch meal. No further skilled ST services indicated as pt appears at his baseline which includes Mild Dementia. Recommend f/u w/ PCP if any new changes/decline from his baseline are noted post discharge and return to home/known environment. Pt agreed. NSG to reconsult if any new change in status while admitted.      Orinda Kenner, MS, CCC-SLP Speech Language Pathologist Rehab Services [phone] [facility] 11/30/2019, 5:47 PM

## 2019-11-30 NOTE — Progress Notes (Signed)
This note also relates to the following rows which could not be included: ECG Heart Rate - Cannot attach notes to unvalidated device data Resp - Cannot attach notes to unvalidated device data    11/30/19 0134 11/30/19 0257  Assess: MEWS Score  Temp 97.9 F (36.6 C) 98.7 F (37.1 C)  BP (!) 208/70 (!) 190/51  Pulse Rate 74 74  ECG Heart Rate 87 75  Resp 18 20  Level of Consciousness Alert Alert  SpO2 100 % 97 %  O2 Device Room Air Room Air  Assess: MEWS Score  MEWS Temp 0 0  MEWS Systolic 2 0  MEWS Pulse 0 0  MEWS RR 0 0  MEWS LOC 0 0  MEWS Score 2 0  MEWS Score Color Yellow Green  Assess: if the MEWS score is Yellow or Red  Were vital signs taken at a resting state? Yes Yes  Focused Assessment No change from prior assessment No change from prior assessment  Early Detection of Sepsis Score *See Row Information* Low Low  MEWS guidelines implemented *See Row Information* Yes No, previously yellow, continue vital signs every 4 hours  Treat  MEWS Interventions Other (Comment) (notified provider) Administered prn meds/treatments  Pain Scale 0-10 Faces  Pain Score 0 0  Take Vital Signs  Increase Vital Sign Frequency   --  Yellow: Q 2hr X 2 then Q 4hr X 2, if remains yellow, continue Q 4hrs  Escalate  MEWS: Escalate  --  Yellow: discuss with charge nurse/RN and consider discussing with provider and RRT  Notify: Charge Nurse/RN  Name of Charge Nurse/RN Notified  --  Kennyth Lose, RN  Date Charge Nurse/RN Notified  --  11/30/19  Time Charge Nurse/RN Notified  --  0145  Notify: Provider  Provider Name/Title  --  Damita Dunnings, MD  Date Provider Notified  --  11/30/19  Time Provider Notified  --  0230  Notification Type  --  Page  Notification Reason  --  Other (Comment) (High Blood Pressure)  Response  --  See new orders  Date of Provider Response  --  11/30/19  Time of Provider Response  --  [phone]  Document  Patient Outcome  --  Stabilized after interventions  copy an pasted for Daylene Posey RN

## 2019-11-30 NOTE — Progress Notes (Signed)
Pharmacy Antibiotic Note  Peter [name] is a 86 y.o. male admitted on 11/29/2019 with cellulitis.  Pharmacy has been consulted for Vanc, Cefepime dosing.  Pt has anaphylaxis to PCN but appears to have completed course of cephalexin in past with no issue, MD ok to continue cefepime.    Plan: Vancomycin 1750 mg IV X 1 ordered for 10/19 @ 0100. Vancomycin 500 mg IV Q24H ordered to start on 10/20 @ ~ 0100.   Cefepime 2 gm IV X 1 given on 10/19 @ 0032. Cefepime 2 gm IV Q24H ordered to continue on 10/20 @ 0000.    Height: 5\' 8"  (172.7 cm) Weight: 74.8 kg (165 lb) IBW/kg (Calculated) : 68.4  Temp (24hrs), Avg:97.5 F (36.4 C), Min:97.5 F (36.4 C), Max:97.5 F (36.4 C)  Recent Labs  Lab 11/29/19 1849  WBC 9.1  CREATININE 1.75*    Estimated Creatinine Clearance: 28.8 mL/min (A) (by C-G formula based on SCr of 1.75 mg/dL (H)).    Allergies  Allergen Reactions   Penicillins Anaphylaxis    Reported airway compromise 50 years    Antimicrobials this admission:   >>    >>   Dose adjustments this admission:   Microbiology results:  BCx:   UCx:    Sputum:    MRSA PCR:   Thank you for allowing pharmacy to be a part of this patients care.  [name] D 11/30/2019 1:23 AM

## 2019-11-30 NOTE — Progress Notes (Addendum)
PT Cancellation Note  Patient Details Name: Rigoberto Repass MRN: [id number] DOB: 10/16/1930   Cancelled Treatment:     Orders received & chart review completed. RN reports BP improvement & cleared pt for participation in PT. PT checks vitals, BP = 171/57 mmHg (LUE, supine) - nurse made aware but cannot give any additional medication at this time. Pt also declines participation at this time. Will f/u with pt.   Waunita Schooner, PT, DPT 11/30/2019, 4:37 PM   Lavone Nian, PT, DPT 11/30/19, 4:38 PM

## 2019-11-30 NOTE — Consult Note (Signed)
Peter Peter [name] Daily Progress Note   Subjective: 10/08/19: 1. Ultrasound guidance for vascular access left femoral artery 2. Catheter placement into right common femoral artery from left femoral approach 3. Aortogram and selective right lower extremity angiogram 4.  Mechanical thrombectomy to the right SFA and popliteal arteries with the roto-Rex device 5.  Percutaneous transluminal angioplasty of the right peroneal artery 3 mm diameter angioplasty balloon             6.  Percutaneous transluminal angioplasty of the right SFA and popliteal arteries with 5 mm diameter angioplasty balloons             7.  Viabahn stent placement to the proximal SFA for residual stenosis and thrombus after angioplasty and thrombectomy using a 6 mm diameter by 10 cm length stent 8. 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  Most recent endovascular intervention on October 08, 2019.  Patient presents with chronic wound to the right second toe.  Objective: Vitals:   11/30/19 0802 11/30/19 0807 11/30/19 1008 11/30/19 1218  BP: (!) 216/64 (!) 211/66 (!) 187/46 (!) 103/44  Pulse: (!) 59 (!) 59 Peter (!) 55  Resp: 18  16 20   Temp: 98.3 F (36.8 C)  97.8 F (36.6 C) 98.2 F (36.8 C)  TempSrc: Oral  Oral Oral  SpO2: 99% 100% 99% 100%  Weight:      Height:        Intake/Output Summary (Last 24 hours) at 11/30/2019 1339 Last data filed at 11/30/2019 0945 Gross per 24 hour  Intake 350 ml  Output 150 ml  Net 200 ml   Physical Exam: A&Ox3, NAD CV: RRR Pulmonary: CTA Bilaterally Abdomen: Soft, Nontender, Nondistended Vascular:  Right lower extremity: Thigh soft.  Calf soft.  Extremity distally toes.  Hard to palpate pedal pulses however the foot  is warm.  Dry gangrene to the second toe.  Previous amputation site healthy and intact.   Laboratory: CBC    Component Value Date/Time   WBC 9.1 11/29/2019 1849   HGB 8.1 (L) 11/29/2019 1849   HCT 27.2 (L) 11/29/2019 1849   PLT 433 (H) 11/29/2019 1849   BMET    Component Value Date/Time   NA 139 11/29/2019 1849   K 4.7 11/29/2019 1849   CL 103 11/29/2019 1849   CO2 23 11/29/2019 1849   GLUCOSE 162 (H) 11/29/2019 1849   BUN 44 (H) 11/29/2019 1849   CREATININE 1.75 (H) 11/29/2019 1849   CALCIUM 8.6 (L) 11/29/2019 1849   GFRNONAA 34 (L) 11/29/2019 1849   GFRAA 52 (L) 11/09/2019 1030   Assessment/Planning: The patient is an 86 year old Peter [name] with multiple medical issues well-known to our service including 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  Most recent endovascular intervention on October 08, 2019.  Patient presents with chronic wound to the right second toe.  1) Atherosclerotic disease to the right lower extremity with gangrene: Patient's most recent endovascular intervention was on October 01, 2019.  Patient with acute into the SFA popliteal, has one-vessel runoff (peroneal).  At that time, it was recommended the patient undergo a femoral to peroneal bypass however the patient refused.  Discussed this again with the patient today.  He continues to refuse. At this time, recommend medical management with aspirin and statin  2) Diabetes: On appropriate medications. Encouraged good  control as its slows the progression of atherosclerotic disease  3) Hyperlipidemia: On statin and aspirin for medical management Encouraged good control as its slows the progression of atherosclerotic disease  Discussed with Dr. Eber Hong Peter Ingham PA-C 11/30/2019 1:39 PM

## 2019-11-30 NOTE — Plan of Care (Signed)
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
## 2019-12-01 ENCOUNTER — Telehealth: Payer: Self-pay

## 2019-12-01 ENCOUNTER — Encounter
Admission: EM | Disposition: A | Discharge status: Home Health | Payer: Self-pay | Source: Home / Self Care | Attending: Internal Medicine

## 2019-12-01 ENCOUNTER — Other Ambulatory Visit: Payer: Self-pay | Admitting: Internal Medicine

## 2019-12-01 DIAGNOSIS — L03115 Cellulitis of right lower limb: Secondary | ICD-10-CM | POA: Diagnosis not present

## 2019-12-01 LAB — CBC
HCT: 23.8 % — ABNORMAL LOW (ref 39.0–52.0)
HCT: 21.9 % — ABNORMAL LOW (ref 39.0–52.0)
Hemoglobin: 7.5 g/dL — ABNORMAL LOW (ref 13.0–17.0)
Hemoglobin: 6.8 g/dL — ABNORMAL LOW (ref 13.0–17.0)
MCH: 25.5 pg — ABNORMAL LOW (ref 26.0–34.0)
MCH: 25.4 pg — ABNORMAL LOW (ref 26.0–34.0)
MCHC: 31.5 g/dL (ref 30.0–36.0)
MCHC: 31.1 g/dL (ref 30.0–36.0)
MCV: 81 fL (ref 80.0–100.0)
MCV: 81.7 fL (ref 80.0–100.0)
Platelets: 379 10*3/uL (ref 150–400)
Platelets: 313 10*3/uL (ref 150–400)
RBC: 2.94 MIL/uL — ABNORMAL LOW (ref 4.22–5.81)
RBC: 2.68 MIL/uL — ABNORMAL LOW (ref 4.22–5.81)
RDW: 16 % — ABNORMAL HIGH (ref 11.5–15.5)
RDW: 16.1 % — ABNORMAL HIGH (ref 11.5–15.5)
WBC: 8.4 10*3/uL (ref 4.0–10.5)
WBC: 6.6 10*3/uL (ref 4.0–10.5)
nRBC: 0 % (ref 0.0–0.2)
nRBC: 0 % (ref 0.0–0.2)

## 2019-12-01 LAB — GLUCOSE, CAPILLARY
Glucose-Capillary: 92 mg/dL (ref 70–99)
Glucose-Capillary: 182 mg/dL — ABNORMAL HIGH (ref 70–99)
Glucose-Capillary: 178 mg/dL — ABNORMAL HIGH (ref 70–99)
Glucose-Capillary: 202 mg/dL — ABNORMAL HIGH (ref 70–99)

## 2019-12-01 LAB — BASIC METABOLIC PANEL
Anion gap: 9 (ref 5–15)
BUN: 41 mg/dL — ABNORMAL HIGH (ref 8–23)
CO2: 24 mmol/L (ref 22–32)
Calcium: 8.1 mg/dL — ABNORMAL LOW (ref 8.9–10.3)
Chloride: 105 mmol/L (ref 98–111)
Creatinine, Ser: 1.52 mg/dL — ABNORMAL HIGH (ref 0.61–1.24)
GFR, Estimated: 41 mL/min — ABNORMAL LOW (ref >60)
Glucose, Bld: 106 mg/dL — ABNORMAL HIGH (ref 70–99)
Potassium: 3.9 mmol/L (ref 3.5–5.1)
Sodium: 138 mmol/L (ref 135–145)

## 2019-12-01 LAB — HEMOGLOBIN A1C
Hgb A1c MFr Bld: 6.5 % — ABNORMAL HIGH (ref 4.8–5.6)
Mean Plasma Glucose: 140 mg/dL

## 2019-12-01 LAB — PREPARE RBC (CROSSMATCH)

## 2019-12-01 LAB — OCCULT BLOOD X 1 CARD TO LAB, STOOL: Fecal Occult Bld: POSITIVE — AB

## 2019-12-01 SURGERY — AMPUTATION, TOE
Anesthesia: Choice | Site: Toe | Laterality: Right

## 2019-12-01 MED ORDER — SODIUM CHLORIDE 0.9% IV SOLUTION: Freq: Once | INTRAVENOUS | Status: DC

## 2019-12-01 MED ORDER — VANCOMYCIN HCL 750 MG/150ML IV SOLN
750.0000 mg | INTRAVENOUS | Status: DC
  Filled 2019-12-01: qty 150

## 2019-12-01 MED ORDER — MEMANTINE HCL 5 MG PO TABS
10.0000 mg | ORAL_TABLET | Freq: Two times a day (BID) | ORAL | Status: DC
  Administered 2019-12-01 – 2019-12-02 (×3): 10 mg via ORAL
  Filled 2019-12-01 (×3): qty 2

## 2019-12-01 MED ORDER — SODIUM CHLORIDE 0.9 % IV SOLN
2.0000 g | Freq: Two times a day (BID) | INTRAVENOUS | Status: DC
  Administered 2019-12-01: 10:00:00 2 g via INTRAVENOUS
  Filled 2019-12-01 (×3): qty 2

## 2019-12-01 MED ORDER — DOXYCYCLINE HYCLATE 100 MG PO TABS
100.0000 mg | ORAL_TABLET | Freq: Two times a day (BID) | ORAL | Status: DC
  Administered 2019-12-01 – 2019-12-02 (×2): 100 mg via ORAL
  Filled 2019-12-01 (×2): qty 1

## 2019-12-01 MED ORDER — ALLOPURINOL 100 MG PO TABS
100.0000 mg | ORAL_TABLET | Freq: Every day | ORAL | Status: DC
  Administered 2019-12-01 – 2019-12-02 (×2): 100 mg via ORAL
  Filled 2019-12-01 (×2): qty 1

## 2019-12-01 MED ORDER — ENOXAPARIN SODIUM 40 MG/0.4ML ~~LOC~~ SOLN: 40.0000 mg | SUBCUTANEOUS | Status: DC

## 2019-12-01 NOTE — Evaluation (Signed)
Occupational Therapy Evaluation Patient Details Name: Peter [name] MRN: [id number] DOB: 13-Nov-1930 Today's Date: 12/01/2019    History of Present Illness Pt is an 86 y/o M admitted on 11/29/19 with TIA like episode that was more severe & long lasting than is typical for the pt (nonverbal, unresponsive & drooling) but pt back to baseline in ED. Pt with recent hx of RLE wound weeping & with foul odor. 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   Clinical Impression   Peter [name] was seen for OT evaluation this date. Prior to hospital admission, pt reports he required at least some assist for BADL management, however he reports his wife has limited physical abilities. Pt states he has PCAs who come 7 days/week to help with house cleaning, meal prep, etc. Pt lives with his spouse  In a 2 level home with 3 STE. Currently pt demonstrates impairments as described below (See OT problem list) which functionally limit his ability to perform ADL/self-care tasks. Pt currently requires MAX A for functional mobility, as well as MAX A for LB ADL management from STS with assist to doff/don post-op shoe and cueing for adherence to WB precautions.  Pt would benefit from skilled OT services to address noted impairments and functional limitations (see below for any additional details) in order to maximize safety and independence while minimizing falls risk and caregiver burden. Upon hospital discharge, recommend STR to maximize pt safety and return to PLOF.      Follow Up Recommendations  SNF;Supervision/Assistance - 24 hour    Equipment Recommendations  Other (comment) (TBD at next venue of care.)    Recommendations for Other Services       Precautions / Restrictions Precautions Precautions: Fall;Other (comment) Precaution Comments:  Hell wedge shoe for RLE Restrictions Weight Bearing Restrictions: Yes RLE Weight Bearing: Weight bearing as tolerated Other Position/Activity Restrictions: Heel WB with offloading shoe      Mobility Bed Mobility Overal bed mobility: Needs Assistance Bed Mobility: Supine to Sit;Sit to Supine;Rolling Rolling: Supervision (with bed rails)   Supine to sit: Max assist;HOB elevated Sit to supine: Supervision (bed rails)   General bed mobility comments: Pt notably lethargic, unable to keep eyes open during session without prompting. Mobility deferred for pt/therapist safety.    Transfers Overall transfer level: Needs assistance   Transfers: Sit to/from Stand Sit to Stand: Max assist         General transfer comment: Pt notably lethargic, unable to keep eyes open during session without prompting. Mobility deferred for pt/therapist safety.    Balance Overall balance assessment: Needs assistance Sitting-balance support: Feet supported;Bilateral upper extremity supported Sitting balance-Leahy Scale: Poor Sitting balance - Comments: difficulty shifting weight anteriorly Postural control: Posterior lean Standing balance support: Bilateral upper extremity supported Standing balance-Leahy Scale: Zero Standing balance comment: BUE support on RW, significant posterior lean                           ADL either performed or assessed with clinical judgement   ADL Overall ADL's : Needs assistance/impaired                                       General ADL Comments: Pt is functionally limited  by generalized weakness, decreased activity tolerance and RLE pain. He requires MAX A for functional mobility. Anticipate MAX A for LB ADL management from STS with total assist to don post-op shoe.     Vision Baseline Vision/History: Wears glasses Wears Glasses: Reading only Patient Visual Report: No change from baseline       Perception     Praxis      Pertinent  Vitals/Pain Pain Assessment: 0-10 Pain Score: 8  Pain Location: R gangrenous toe Pain Descriptors / Indicators: Grimacing;Discomfort;Stabbing Pain Intervention(s): Limited activity within patient's tolerance;Monitored during session;Repositioned     Hand Dominance Right   Extremity/Trunk Assessment Upper Extremity Assessment Upper Extremity Assessment: Generalized weakness   Lower Extremity Assessment Lower Extremity Assessment: Generalized weakness;RLE deficits/detail RLE Deficits / Details: Heel WB with functional mobility using post-op shoe.   Cervical / Trunk Assessment Cervical / Trunk Assessment:  (posterior pelvic tilt, rounded shoulders)   Communication Communication Communication: No difficulties   Cognition Arousal/Alertness: Lethargic;Suspect due to medications Behavior During Therapy: [facility] for tasks assessed/performed Overall Cognitive Status: No family/caregiver present to determine baseline cognitive functioning                                 General Comments: Pt A&O x4, endorses feeling groggy and is notably lethargic t/o session. No family/caregiver present to determine baseline level of cognitive function, per chart some hx of cognitive impairment at baseline.   General Comments  RLE bandages in place at start/end of session.    Exercises Other Exercises Other Exercises: Pt educated on role of OT in acute setting, safe use of AE/DME for ADL management, and falls prevention for home/hospital. Education limited by pt lethargy during session.   Shoulder Instructions      Home Living Family/patient expects to be discharged to:: Private residence Living Arrangements: Spouse/significant other Available Help at Discharge: Family Type of Home: House Home Access: Stairs to enter [employer] of Steps: 3 Entrance Stairs-Rails: Right;Left;Can reach both [place]: Two level;Able to live on main level with bedroom/bathroom     Bathroom  Shower/Tub: Walk-in shower   Bathroom Toilet: Handicapped height     Home Equipment: Environmental consultant - 2 wheels;Wheelchair - manual;Shower seat - built in;Bedside commode          Prior Functioning/Environment Level of Independence: Needs assistance  Gait / Transfers Assistance Needed: Pt reports ambulating with RW or using a WC at baseline depending on needs that day. ADL's / Homemaking Assistance Needed: States he is generally requires at least some assist for ADL management. Has PCA 7 days/week that assists with house cleaning, medication management.   Comments: Pt ambulating with RW or using w/c at baseline. Reports he needed some assist PTA but wife is not in great physical health either. Daughter can assist some.        OT Problem List: Decreased strength;Pain;Decreased range of motion;Decreased activity tolerance;Decreased safety awareness;Impaired balance (sitting and/or standing);Decreased cognition;Decreased knowledge of use of DME or AE;Decreased knowledge of precautions      OT Treatment/Interventions: Self-care/ADL training;Therapeutic exercise;Therapeutic activities;DME and/or AE instruction;Patient/family education;Balance training    OT Goals(Current goals can be found in the care plan section) Acute Rehab OT Goals Patient Stated Goal: To have less pain in my foot OT Goal Formulation: With patient Time For Goal Achievement: 12/15/19 Potential to Achieve Goals: Good ADL Goals Pt Will Perform Lower Body Dressing: sit to/from stand;with supervision;with set-up (c LRAD PRN for  improved safety and functional indep.) Pt Will Transfer to Toilet: bedside commode;with supervision;with set-up;stand pivot transfer (c LRAD PRN for improved safety and functional indep.) Pt Will Perform Toileting - Clothing Manipulation and hygiene: sit to/from stand;with supervision;with set-up;with adaptive equipment (c LRAD PRN for improved safety and functional indep.)  OT Frequency: Min 1X/week    Barriers to D/C: Decreased caregiver support;Inaccessible home environment          Co-evaluation              AM-PAC OT "6 Clicks" Daily Activity     Outcome Measure Help from another person eating meals?: A Little Help from another person taking care of personal grooming?: A Little Help from another person toileting, which includes using toliet, bedpan, or urinal?: A Lot Help from another person bathing (including washing, rinsing, drying)?: A Lot Help from another person to put on and taking off regular upper body clothing?: A Little Help from another person to put on and taking off regular lower body clothing?: A Lot 6 Click Score: 15   End of Session Equipment Utilized During Treatment: Gait belt;Rolling walker Nurse Communication: Patient requests pain meds  Activity Tolerance: Patient tolerated treatment well Patient left: in bed;with call bell/phone within reach;with bed alarm set;Other (comment) (Heels floated)  OT Visit Diagnosis: Other abnormalities of gait and mobility (R26.89);Muscle weakness (generalized) (M62.81);Pain Pain - Right/Left: Right Pain - part of body: Ankle and joints of foot                Time: 4196-2229 OT Time Calculation (min): 20 min Charges:  OT General Charges $OT Visit: 1 Visit OT Evaluation $OT Eval Moderate Complexity: 1 Mod OT Treatments $Self Care/Home Management : 8-22 mins  Shara Blazing, M.S., OTR/L Ascom: [phone] 12/01/19, 3:03 PM

## 2019-12-01 NOTE — Progress Notes (Signed)
[facility] Progress Note  Patient: Peter [name]    OVF:[id number]  [place]: 11/29/2019     Date of Service: the patient was seen and examined on 12/01/2019  Brief hospital course: Ezekiel Menzer is an 86 y.o. male with a history of severe PVD s/p several stent angioplasties, s/p multiple toe amputations, as well as TIA, CAD, T2DM, stage IIIb CKD, iron deficiency and AOCD, and mild dementia who is followed by home hospice and presented to the ED following an episode of AMS. CT head showed chronic microvascular changes without acute abnormality. Vascular surgery was consulted for right 2nd toe gangrene. They recommended revascularization though the patient has declined. Podiatry is consulted for amputation and the patient is continued on antibiotics.  Currently plan is monitor H&H.  Assessment and Plan: Recurrent TIAs. Acute metabolic encephalopathy CT head nonacute in ED. Drooling and slurred speech resolved on arrival to ED.  Mentation seems to have remained normal.  Carotid Doppler negative. Currently on aspirin and Plavix.  I explained to daughter that it will be difficult to take the patient off of the Plavix in the setting of recurrent TIAs.  Severe PVD complicating right 2nd toe dry gangrene, right foot cellulitis.  Right foot XR without bony abnormality.  He's afebrile without leukocytosis and very mild changes to suggest cellulitis at admission which is improving. - LE U/S demonstrated occlusion of the right fem-pop bypass graft. No DVT. - Has declined revascularization  - Podiatry consulted for amputation, appreciate Dr. Olin Pia input.  Not a good idea to amputate the patient as it will have less chance of healing. - Continue antibiotics given cellulitis at admission.  Switch to oral doxycycline. - Continue antiplatelets, statin per vascular surgery recommendations. LDL is 38.   Iron deficiency anemia requiring recurrent infusions, also an element of anemia of chronic  disease:  - Pt having dark stools (he is on iron), remains hemodynamically stable FOBT positive. CBC actually trending down.  We will transfuse 1 PRBC. May require upper GI endoscopy versus nuclear medicine RBC scan. Discussed with GI unable to stop Plavix for now. If continues to have bleeding or drop in hemoglobin will consult vascular surgery as well as cardiology for clearance of Plavix discontinuation.  HTN, CAD:  - Continue imdur to avoid angina. ?not on beta blocker, not on allergies. antiplatelets and statin as above. - Hold HCTZ, norvasc, and losartan for permissive HTN for now.   T2DM:  Well-controlled with HbA1c 6%.  - Continue insulin to maintain tight control.   Stage IIIb CKD:  SCr modestly up from baseline (1.75 from ~1.6) - Avoid nephrotoxins  BPH:  - Continue cardura, finasteride  Dementia: Mild.  - Continue home medications  Diet: Carb modified diet DVT Prophylaxis: SCD, pharmacological prophylaxis contraindicated due to Concern for GI bleed      Advance goals of care discussion: DNR  Family Communication: no family was present at bedside, at the time of interview.  The pt provided permission to discuss medical plan with the family. Opportunity was given to ask question and all questions were answered satisfactorily.   Disposition:  Status is: Inpatient  Remains inpatient appropriate because:IV treatments appropriate due to intensity of illness or inability to take PO   Dispo: The patient is from: Home              Anticipated d/c is to: Home              Anticipated d/c date is: 2 days  Patient currently is not medically stable to d/c.  Subjective: Patient denies any acute complaint Reported some pain in the foot after taking Percocet but currently no pain right now.  No nausea or vomiting.  Physical Exam:  General: Appear in mild distress, no Rash; Oral Mucosa Clear, moist. no Abnormal Neck Mass Or lumps, Conjunctiva normal    Cardiovascular: S1 and S2 Present, aortic systolic  Murmur, Respiratory: good respiratory effort, Bilateral Air entry present and CTA, no Crackles, no wheezes Abdomen: Bowel Sound present, Soft and no tenderness Extremities: no Pedal edema Neurology: alert and oriented to time, place, and person affect appropriate. no new focal deficit Gait not checked due to patient safety concerns  Vitals:   12/01/19 0433 12/01/19 0818 12/01/19 1149 12/01/19 1536  BP: (!) 138/30 (!) 191/58 (!) 111/51 (!) 145/52  Pulse: 66 78 70 63  Resp: 18 18 17  (!) 21  Temp: 98.5 F (36.9 C) 98.9 F (37.2 C) 98.4 F (36.9 C) 97.9 F (36.6 C)  TempSrc: Oral Oral Oral Oral  SpO2: 98% 98% 99% 99%  Weight:      Height:        Intake/Output Summary (Last 24 hours) at 12/01/2019 1909 Last data filed at 12/01/2019 1441 Gross per 24 hour  Intake 536.78 ml  Output 400 ml  Net 136.78 ml   Filed Weights   11/29/19 1803  Weight: 74.8 kg    Data Reviewed: I have personally reviewed and interpreted daily labs, tele strips, imagings as discussed above. I reviewed all nursing notes, pharmacy notes, vitals, pertinent old records I have discussed plan of care as described above with RN and patient/family.  CBC: Recent Labs  Lab 11/29/19 1849 11/30/19 1508 12/01/19 0537 12/01/19 1728  WBC 9.1 10.8* 8.4 6.6  NEUTROABS 7.5  --   --   --   HGB 8.1* 8.2* 7.5* 6.8*  HCT 27.2* 26.2* 23.8* 21.9*  MCV 84.0 81.9 81.0 81.7  PLT 433* 378 379 127   Basic Metabolic Panel: Recent Labs  Lab 11/29/19 1849 12/01/19 0537  NA 139 138  K 4.7 3.9  CL 103 105  CO2 23 24  GLUCOSE 162* 106*  BUN 44* 41*  CREATININE 1.75* 1.52*  CALCIUM 8.6* 8.1*    Studies: No results found.  Scheduled Meds:   stroke: mapping our early stages of recovery book   Does not apply Once   sodium chloride   Intravenous Once   allopurinol  100 mg Oral Daily   aspirin EC  81 mg Oral QPM   atorvastatin  80 mg Oral Daily    clopidogrel  75 mg Oral Daily   donepezil  10 mg Oral Daily   doxazosin  4 mg Oral Daily   doxycycline  100 mg Oral Q12H   DULoxetine  30 mg Oral Daily   ferrous sulfate  325 mg Oral Q breakfast   finasteride  5 mg Oral Daily   gabapentin  300 mg Oral BID   influenza vaccine adjuvanted  0.5 mL Intramuscular Tomorrow-1000   insulin aspart  0-15 Units Subcutaneous TID WC   insulin aspart  0-5 Units Subcutaneous QHS   insulin glargine  10 Units Subcutaneous QHS   isosorbide mononitrate  60 mg Oral Daily   memantine  10 mg Oral BID   Continuous Infusions: PRN Meds: acetaminophen **OR** acetaminophen (TYLENOL) oral liquid 160 mg/5 mL **OR** acetaminophen, hydrALAZINE, labetalol, oxyCODONE-acetaminophen  Time spent: 35 minutes  Author: Berle Mull, MD [facility] 12/01/2019 7:09  PM  To reach On-call, see care teams to locate the attending and reach out via [email or web address]. Between 7PM-7AM, please contact night-coverage If you still have difficulty reaching the attending provider, please page the [facility] (Director on Call) for [facility] on amion for assistance.

## 2019-12-01 NOTE — Progress Notes (Signed)
PHARMACIST - PHYSICIAN COMMUNICATION  CONCERNING:  Enoxaparin (Lovenox) for DVT Prophylaxis    RECOMMENDATION: Patient was prescribed enoxaprin 30mg  q24 hours for VTE prophylaxis.   Filed Weights   11/29/19 1803  Weight: 74.8 kg (165 lb)    Body mass index is 25.09 kg/m.  Estimated Creatinine Clearance: 33.1 mL/min (A) (by C-G formula based on SCr of 1.52 mg/dL (H)).  Patient is candidate for enoxaparin 40mg  every 24 hours based on CrCl >14ml/min AND Weight >45kg  DESCRIPTION: Pharmacy has adjusted enoxaparin dose per [facility] policy.  Patient is now receiving enoxaparin 40 mg every 24 hours    Lu Duffel, PharmD Clinical Pharmacist  12/01/2019 8:55 AM

## 2019-12-01 NOTE — Evaluation (Signed)
Physical Therapy Evaluation Patient Details Name: Peter [name] MRN: [id number] DOB: [birth year] Today's Date: 12/01/2019   History of Present Illness  Pt is an 86 y/o M admitted on 11/29/19 with TIA like episode that was more severe & long lasting than is typical for the pt (nonverbal, unresponsive & drooling) but pt back to baseline in ED. Pt with recent hx of RLE wound weeping & with foul odor. 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    Clinical Impression  Pt is pleasant & agreeable to tx. Pt demonstrates significant generalized weakness & impaired balance, requiring significant assistance to come to sitting EOB, poor static sitting balance EOB, and requires max assist to attempt sit<>stand and maintain static standing balance briefly EOB. PT does educate pt on importance & need to wear RLE offloading shoe to promote healing in R foot with PT providing total assist for donning/doffing shoe. Pt would benefit from continued skilled PT treatment to focus on deficits noted and to increase safety & independence with functional mobility. Recommending 24 hr assist at d/c.  BP during session = 145/40 mmHg (LUE, supine) Pt on room air during session & SpO2 dropped to ~85% but quickly came up so question pulse ox error, as no other instances of low SpO2 noted.    Follow Up Recommendations SNF;Supervision/Assistance - 24 hour    Equipment Recommendations   (TBD in next venue)    Recommendations for Other Services       Precautions / Restrictions Precautions Precautions: Fall Restrictions Weight Bearing Restrictions: Yes RLE Weight Bearing: Weight bearing as tolerated (with offloading shoe)      Mobility  Bed Mobility Overal bed mobility: Needs Assistance Bed Mobility: Supine to Sit;Sit to  Supine;Rolling Rolling: Supervision (with bed rails)   Supine to sit: Max assist;HOB elevated Sit to supine: Supervision (bed rails)   General bed mobility comments: pt requires max assist to upright trunk and come to sitting EOB    Transfers Overall transfer level: Needs assistance   Transfers: Sit to/from Stand Sit to Stand: Max assist         General transfer comment: max cuing for BUE placement, limited by overall weakness & posterior lean  Ambulation/Gait                Stairs            Wheelchair Mobility    Modified Rankin (Stroke Patients Only)       Balance Overall balance assessment: Needs assistance Sitting-balance support: Feet supported;Bilateral upper extremity supported Sitting balance-Leahy Scale: Poor Sitting balance - Comments: difficulty shifting weight anteriorly Postural control: Posterior lean Standing balance support: Bilateral upper extremity supported Standing balance-Leahy Scale: Zero Standing balance comment: BUE support on RW, significant posterior lean                             Pertinent Vitals/Pain Pain Assessment: 0-10 Pain Score: 9  Pain Location: R gangrenous toe Pain Descriptors / Indicators: Grimacing;Discomfort Pain Intervention(s):  (RN gave pain meds prior to session)    Home Living Family/patient expects to be discharged to:: Private residence Living Arrangements: Spouse/significant other Available Help at Discharge: Family Type of Home: House Home Access: Stairs to enter Entrance Stairs-Rails: Right;Left;Can reach both Entrance Stairs-Number of Steps: 3 Home Layout: Two level;Able to  live on main level with bedroom/bathroom Home Equipment: Gilford Rile - 2 wheels;Wheelchair - manual      Prior Function Level of Independence: Needs assistance         Comments: Pt ambulating with RW or using w/c at baseline. Reports he needed some assist PTA but wife is not in great physical health either.  Daughter can assist some.     Hand Dominance        Extremity/Trunk Assessment   Upper Extremity Assessment Upper Extremity Assessment: Generalized weakness    Lower Extremity Assessment Lower Extremity Assessment: Generalized weakness    Cervical / Trunk Assessment Cervical / Trunk Assessment:  (posterior pelvic tilt, rounded shoulders)  Communication      Cognition Arousal/Alertness: Awake/alert   Overall Cognitive Status: No family/caregiver present to determine baseline cognitive functioning                                 General Comments: Once sitting EOB pt with delayed answers to PT re: how he felt, unable to describe how he felt at end of session stating he just felt "blah"      General Comments      Exercises     Assessment/Plan    PT Assessment Patient needs continued PT services  PT Problem List Decreased strength;Decreased mobility;Decreased safety awareness;Decreased knowledge of precautions;Decreased coordination;Decreased range of motion;Decreased activity tolerance;Decreased cognition;Cardiopulmonary status limiting activity;Decreased skin integrity;Pain;Impaired sensation;Decreased knowledge of use of DME;Decreased balance       PT Treatment Interventions DME instruction;Therapeutic exercise;Wheelchair mobility training;Manual techniques;Balance training;Gait training;Stair training;Neuromuscular re-education;Modalities;Functional mobility training;Cognitive remediation;Therapeutic activities;Patient/family education    PT Goals (Current goals can be found in the Care Plan section)  Acute Rehab PT Goals Patient Stated Goal: none stated PT Goal Formulation: With patient Time For Goal Achievement: 12/15/19 Potential to Achieve Goals: Fair    Frequency Min 2X/week   Barriers to discharge Decreased caregiver support;Inaccessible home environment steps to enter home, pt's wife is not in great physical assist either ( per pt)     Co-evaluation               AM-PAC PT "6 Clicks" Mobility  Outcome Measure Help needed turning from your back to your side while in a flat bed without using bedrails?: A Little Help needed moving from lying on your back to sitting on the side of a flat bed without using bedrails?: A Lot Help needed moving to and from a bed to a chair (including a wheelchair)?: Total Help needed standing up from a chair using your arms (e.g., wheelchair or bedside chair)?: A Lot Help needed to walk in hospital room?: Total Help needed climbing 3-5 steps with a railing? : Total 6 Click Score: 10    End of Session Equipment Utilized During Treatment: Gait belt Activity Tolerance: Patient tolerated treatment well Patient left: in bed;with call bell/phone within reach;with bed alarm set Nurse Communication: Mobility status PT Visit Diagnosis: Unsteadiness on feet (R26.81);Other abnormalities of gait and mobility (R26.89);Muscle weakness (generalized) (M62.81);History of falling (Z91.81);Difficulty in walking, not elsewhere classified (R26.2)    Time: 9163-8466 PT Time Calculation (min) (ACUTE ONLY): 25 min   Charges:   PT Evaluation $PT Eval Moderate Complexity: 1 Mod PT Treatments $Therapeutic Activity: 8-22 mins       Lavone Nian, PT, DPT 12/01/19, 12:52 PM  Waunita Schooner 12/01/2019, 12:48 PM

## 2019-12-01 NOTE — Progress Notes (Signed)
Pharmacy Antibiotic Note  Peter [name] is a 86 y.o. male admitted on 11/29/2019 with cellulitis.    Pharmacy has been consulted for Vanc, Cefepime dosing.    Pt has anaphylaxis to PCN but appears to have completed course of cephalexin in past with no issue, MD ok to continue cefepime.    Plan: Renal function improved Will increase Cefepime to 2g q12h  Vancomycin 1750 mg IV X 1 ordered for 10/19 @ 0100.  Vancomycin 500 mg IV Q24H ordered to start on 10/20 @ ~ 0100.  Will increase Vancomycin to 750mg  q24h   Height: 5\' 8"  (172.7 cm) Weight: 74.8 kg (165 lb) IBW/kg (Calculated) : 68.4  Temp (24hrs), Avg:98.4 F (36.9 C), Min:97.8 F (36.6 C), Max:98.9 F (37.2 C)  Recent Labs  Lab 11/29/19 1849 11/30/19 1508 12/01/19 0537  WBC 9.1 10.8* 8.4  CREATININE 1.75*  --  1.52*    Estimated Creatinine Clearance: 33.1 mL/min (A) (by C-G formula based on SCr of 1.52 mg/dL (H)).    Allergies  Allergen Reactions  . Penicillins Anaphylaxis    Reported airway compromise 50 years    Antimicrobials this admission:  Cefepime 10/19 >>   Vancomycin 10/19 >>   Dose adjustments this admission: As outlined above  Microbiology results: COVID NEG/FLU NEG No other cultures pending.  Thank you for allowing pharmacy to be a part of this patient's care.  Lu Duffel, PharmD, BCPS Clinical Pharmacist 12/01/2019 7:27 AM

## 2019-12-01 NOTE — Telephone Encounter (Signed)
Returned patient's wife call. She wanted to inform us to cancel his procedure for tomorrow because patient is in the hospital currently.

## 2019-12-01 NOTE — TOC Initial Note (Signed)
Transition of Care [facility]) - Initial/Assessment Note    Patient Details  Name: Peter [name] MRN: [id number] Date of Birth: 1930/07/06  Transition [facility] Care [facility]) CM/SW Contact:    Shelbie Hutching, RN Phone Number: 12/01/2019, 10:41 AM  Clinical Narrative:                  Patient admitted with peripheral vascular disease, has a history of diabetes and severe peripheral vascular disease.  Patient has dry gangrene on right 2nd toe that Podiatry is recommending OP follow up with them in 2 weeks.  Patient is from home with his wife.  Patient has 2 daughters.  Patient is open with Outpatient Palliative through [facility] and [facility] for home health services.  Patient reports that his wife can pick him up at discharge, wife, Vaughan Basta reports that one of the patient's daughters will pick him up at discharge.  Patient has a walker, cane and wheelchair at home.  Patient endorses mostly being in the wheelchair recently.   TOC team will cont to follow.  Joelene Millin with [facility] aware of admission and Santiago Glad with [facility] notified of admission.   Expected Discharge Plan: [place] Barriers to Discharge: Continued Medical Work up   Patient Goals and CMS Choice Patient states their goals for this hospitalization and ongoing recovery are:: To return home with home health services CMS Medicare.gov Compare Post Acute Care list provided to:: Patient Choice offered to / list presented to : Patient, Spouse  Expected Discharge Plan and Services Expected Discharge Plan: [place]   Discharge Planning Services: CM Consult Post Acute Care Choice: [place], Resumption of Svcs/PTA Provider Living arrangements for the past 2 months: [place] Arranged: RN, PT, OT [facility] Agency: [facility] Date [place]: 12/01/19 Time [place]: 1039 Representative spoke with at [place]: Joelene Millin  Prior  Living Arrangements/Services Living arrangements for the past 2 months: [place] with:: Spouse Patient language and need for interpreter reviewed:: Yes Do you feel safe going back to the place where you live?: Yes      Need for Family Participation in Patient Care: Yes (Comment) (severe PVD) Care giver support system in place?: Yes (comment) Current home services: Home RN, Home PT, Other (comment) (OP Palliative) Criminal Activity/Legal Involvement Pertinent to Current Situation/Hospitalization: No - Comment as needed  Activities of Daily Living Home Assistive Devices/Equipment: None ADL Screening (condition at time of admission) Patient's cognitive ability adequate to safely complete daily activities?: Yes Is the patient deaf or have difficulty hearing?: No Does the patient have difficulty seeing, even when wearing glasses/contacts?: No Does the patient have difficulty concentrating, remembering, or making decisions?: No Patient able to express need for assistance with ADLs?: Yes Does the patient have difficulty dressing or bathing?: Yes Independently performs ADLs?: No Communication: Independent Dressing (OT): Needs assistance Is this a change from baseline?: Pre-admission baseline Grooming: Needs assistance Is this a change from baseline?: Pre-admission baseline Feeding: Independent Bathing: Needs assistance Does the patient have difficulty walking or climbing stairs?: Yes Weakness of Legs: Right Weakness of Arms/Hands: None  Permission Sought/Granted Permission sought to share information with : Case Manager, Family Supports, Other (comment) Permission granted to share information with : Yes, Verbal Permission Granted  Share Information with NAME: Vaughan Basta  Permission granted to share info w AGENCY: [facility]  Permission granted to share info w Relationship: wife     Emotional Assessment Appearance:: Appears younger than stated age Attitude/Demeanor/Rapport:  Engaged Affect (typically observed): Accepting Orientation: : Oriented to Self, Oriented to Place, Oriented to  Time, Oriented to Situation Alcohol / Substance Use: Not Applicable Psych Involvement: No (comment)  Admission diagnosis:  TIA (transient ischemic attack) [G45.9] Cellulitis of leg, right [L03.115] Necrotic toes (HCC) [I96] Chronic dementia without behavioral disturbance (HCC) [F03.90] Type 2 diabetes mellitus with other circulatory complication, with long-term current use of insulin ([place]) [E11.59, Z79.4] Patient Active Problem List   Diagnosis Date Noted  . Cellulitis of right lower leg 11/29/2019  . TIA (transient ischemic attack) 11/29/2019  . Necrotic toes ([place]) 11/29/2019  . Atherosclerosis of native arteries of extremities with gangrene, bilateral legs ([place]) 11/29/2019  . AKI (acute kidney injury) ([place]) 11/29/2019  . Anemia due to stage 3b chronic kidney disease ([place]) 10/25/2019  . DNR (do not resuscitate) discussion 10/14/2019  . Current moderate episode of major depressive disorder without prior episode ([place]) 10/14/2019  . Iron deficiency anemia 10/14/2019  . Cerebrovascular accident (CVA) ([place]) 10/08/2019  . Joint stiffness of right lower leg 10/04/2019  . Numbness and tingling in right hand 10/04/2019  . Right leg weakness 10/04/2019  . Memory loss or impairment 07/14/2019  . Weakness 07/14/2019  . NSTEMI (non-ST elevated myocardial infarction) ([place]) 07/02/2019  . Unstable angina ([place]) 06/27/2019  . CAD in native artery 06/27/2019  . Chest pain   . Elevated troponin level   . Polyneuropathy associated with underlying disease ([place]) 06/15/2019  . Right hand weakness 06/15/2019  . Peripheral vascular disease, unspecified ([place]) 04/06/2019  . Postural urinary incontinence 03/01/2019  . Balance problem 03/01/2019  . Trigger middle finger of right hand 03/01/2019  . Diarrhea 12/28/2018  . Coronary artery disease involving native coronary artery of native heart  without angina pectoris 09/15/2018  . Chronic SI joint pain 09/01/2018  . Bilateral hip pain 09/01/2018  . Chronic pain syndrome 09/01/2018  . Hx of myocardial infarction 06/15/2018  . Chronic bilateral low back pain 06/15/2018  . History of amputation of lesser toe of right foot ([place]) 06/15/2018  . Hx of malignant neoplasm of prostate 06/15/2018  . Atherosclerosis of native arteries of the extremities with gangrene ([place]) 11/04/2016  . Chronic pain of right knee 05/06/2016  . Peripheral neuropathy 06/15/2015  . Hyperlipidemia 06/15/2015  . Abdominal pain   . Hypertension 04/18/2014  . DM (diabetes mellitus) type II controlled with renal manifestation ([place]) 04/18/2014  . Chronic kidney disease, stage 3b ([place]) 04/18/2014  . Dementia ([place]) 04/18/2014  . Near syncope 04/18/2014  . Bradycardia 04/18/2014   PCP:  Lesleigh Noe, MD Pharmacy:   [id number] [place], [place] [address] Phone: [phone] Fax: [phone]  [facility] [place], [place] - [address] [place] [address] Phone: [phone] Fax: [phone]  [facility] [place] [place] [facility] [place] [address] Phone: [phone] Fax: [phone]     Social Determinants of Health (SDOH) Interventions    Readmission Risk Interventions Readmission Risk Prevention Plan 12/01/2019  Transportation Screening Complete  Medication Review (RN Care Manager) Complete  PCP or Specialist appointment within 3-5 days of discharge Complete  HRI or [place]  Complete  SW Recovery Care/Counseling Consult Complete  Palliative Care Screening Complete  Skilled Nursing Facility Not Applicable  Some recent data might be hidden

## 2019-12-02 ENCOUNTER — Ambulatory Visit: Payer: Medicare Other

## 2019-12-02 ENCOUNTER — Encounter: Admission: RE | Payer: Self-pay | Source: Home / Self Care

## 2019-12-02 ENCOUNTER — Ambulatory Visit: Admission: RE | Admit: 2019-12-02 | Payer: Medicare Other | Source: Home / Self Care | Admitting: Gastroenterology

## 2019-12-02 ENCOUNTER — Other Ambulatory Visit: Payer: Self-pay | Admitting: Family Medicine

## 2019-12-02 LAB — GLUCOSE, CAPILLARY
Glucose-Capillary: 90 mg/dL (ref 70–99)
Glucose-Capillary: 188 mg/dL — ABNORMAL HIGH (ref 70–99)

## 2019-12-02 LAB — CBC
HCT: 25.8 % — ABNORMAL LOW (ref 39.0–52.0)
HCT: 26.5 % — ABNORMAL LOW (ref 39.0–52.0)
HCT: 29.3 % — ABNORMAL LOW (ref 39.0–52.0)
Hemoglobin: 8.3 g/dL — ABNORMAL LOW (ref 13.0–17.0)
Hemoglobin: 8.6 g/dL — ABNORMAL LOW (ref 13.0–17.0)
Hemoglobin: 9.3 g/dL — ABNORMAL LOW (ref 13.0–17.0)
MCH: 26.1 pg (ref 26.0–34.0)
MCH: 26.1 pg (ref 26.0–34.0)
MCH: 25.8 pg — ABNORMAL LOW (ref 26.0–34.0)
MCHC: 32.2 g/dL (ref 30.0–36.0)
MCHC: 32.5 g/dL (ref 30.0–36.0)
MCHC: 31.7 g/dL (ref 30.0–36.0)
MCV: 81.1 fL (ref 80.0–100.0)
MCV: 80.5 fL (ref 80.0–100.0)
MCV: 81.4 fL (ref 80.0–100.0)
Platelets: 321 10*3/uL (ref 150–400)
Platelets: 312 10*3/uL (ref 150–400)
Platelets: 332 10*3/uL (ref 150–400)
RBC: 3.18 MIL/uL — ABNORMAL LOW (ref 4.22–5.81)
RBC: 3.29 MIL/uL — ABNORMAL LOW (ref 4.22–5.81)
RBC: 3.6 MIL/uL — ABNORMAL LOW (ref 4.22–5.81)
RDW: 15.7 % — ABNORMAL HIGH (ref 11.5–15.5)
RDW: 15.6 % — ABNORMAL HIGH (ref 11.5–15.5)
RDW: 15.6 % — ABNORMAL HIGH (ref 11.5–15.5)
WBC: 7.1 10*3/uL (ref 4.0–10.5)
WBC: 8.2 10*3/uL (ref 4.0–10.5)
WBC: 7.6 10*3/uL (ref 4.0–10.5)
nRBC: 0 % (ref 0.0–0.2)
nRBC: 0 % (ref 0.0–0.2)
nRBC: 0 % (ref 0.0–0.2)

## 2019-12-02 LAB — TYPE AND SCREEN
ABO/RH(D): O POS
Antibody Screen: NEGATIVE
Unit division: 0

## 2019-12-02 LAB — BASIC METABOLIC PANEL
Anion gap: 9 (ref 5–15)
BUN: 35 mg/dL — ABNORMAL HIGH (ref 8–23)
CO2: 23 mmol/L (ref 22–32)
Calcium: 7.8 mg/dL — ABNORMAL LOW (ref 8.9–10.3)
Chloride: 106 mmol/L (ref 98–111)
Creatinine, Ser: 1.26 mg/dL — ABNORMAL HIGH (ref 0.61–1.24)
GFR, Estimated: 51 mL/min — ABNORMAL LOW (ref >60)
Glucose, Bld: 100 mg/dL — ABNORMAL HIGH (ref 70–99)
Potassium: 4 mmol/L (ref 3.5–5.1)
Sodium: 138 mmol/L (ref 135–145)

## 2019-12-02 LAB — BPAM RBC
Blood Product Expiration Date: 202111102359
ISSUE DATE / TIME: 202110202247
Unit Type and Rh: 5100

## 2019-12-02 SURGERY — ESOPHAGOGASTRODUODENOSCOPY (EGD) WITH PROPOFOL
Anesthesia: General

## 2019-12-02 MED ORDER — HYDRALAZINE HCL 50 MG PO TABS
25.0000 mg | ORAL_TABLET | Freq: Three times a day (TID) | ORAL | Status: DC
  Administered 2019-12-02: 12:00:00 25 mg via ORAL
  Filled 2019-12-02: qty 1

## 2019-12-02 MED ORDER — AMLODIPINE BESYLATE 10 MG PO TABS
10.0000 mg | ORAL_TABLET | Freq: Every day | ORAL | Status: DC
  Administered 2019-12-02: 10:00:00 10 mg via ORAL
  Filled 2019-12-02: qty 1

## 2019-12-02 MED ORDER — DOXYCYCLINE HYCLATE 100 MG PO TABS: 100.0000 mg | ORAL_TABLET | Freq: Two times a day (BID) | ORAL | 0 refills | Status: AC

## 2019-12-02 NOTE — TOC Progression Note (Signed)
Transition of Care [facility]) - Progression Note    Patient Details  Name: Peter [name] MRN: [id number] Date of Birth: [birth year]  Transition of Care [facility]) CM/SW Contact  Shelbie Hutching, RN Phone Number: 12/02/2019, 10:49 AM  Clinical Narrative:    RNCM spoke with patient's daughter [name] via phone about discharge planning.  PT has recommended SNF but family would like to bring the patient home.  Patient is open with [facility] for home health services and they have a hired caregiver to assist at home as well.  Potential discharge for this afternoon as long as blood work remains stable.  [name] will be able to pick him up this evening.    Expected Discharge Plan: [place] Barriers to Discharge: Continued Medical Work up  Expected Discharge Plan and Services Expected Discharge Plan: [place]   Discharge Planning Services: CM Consult Post Acute Care Choice: [place], Resumption of Svcs/PTA Provider Living arrangements for the past 2 months: Single Family Home                           HH Arranged: RN, PT, OT [facility] Agency: [facility] [place] Date [place]: 12/01/19 Time [place]: 1039 Representative spoke with at [place]: [place] ([place]) Interventions    Readmission Risk Interventions Readmission Risk Prevention Plan 12/01/2019  Transportation Screening Complete  Medication Review Press photographer) Complete  PCP or Specialist appointment within 3-5 days of discharge Complete  HRI or [place] Complete  SW Recovery Care/Counseling Consult Complete  [place] Not Applicable  Some recent data might be hidden

## 2019-12-02 NOTE — Progress Notes (Signed)
Physical Therapy Treatment Patient Details Name: Peter [name] MRN: [id number] DOB: 08-29-1930 Today's Date: 12/02/2019    History of Present Illness Pt is an 86 y/o M admitted on 11/29/19 with TIA like episode that was more severe & long lasting than is typical for the pt (nonverbal, unresponsive & drooling) but pt back to baseline in ED. Pt with recent hx of RLE wound weeping & with foul odor. 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    PT Comments    Patient alert, agreeable to transfer to recliner in preparation for breakfast. BP assessed twice in supine, lowest reading 190/52, RN notified and informed PT he has had his morning meds, agreeable to transfer to chair. The patient performed supine to sit with [facility] with supervision and use of bed rails. Fair sitting balance noted. Sit <> stand from EOB with cueing for hand placement and maxA, Once up in standing, min-modA with occasional CGA to maintain static standing. Verbal cues for sequencing of step to gait pattern for safe transfer to chair. one LOB noted, modA to correct, pt endorsed anxiety after, but able to safely turn and sit. Repositioned with all needs in reach, BP 164/52. The patient would benefit from further skilled PT intervention to continue to progress towards goals. Recommendation remains appropriate.    Follow Up Recommendations  SNF     Equipment Recommendations  Other (comment) (TBD in next venue of care)    Recommendations for Other Services       Precautions / Restrictions Precautions Precautions: Fall;Other (comment) Precaution Comments: Hell wedge shoe for RLE Restrictions Weight Bearing Restrictions: Yes RLE Weight Bearing: Weight bearing as tolerated Other Position/Activity Restrictions: Heel WB with offloading shoe    Mobility   Bed Mobility Overal bed mobility: Needs Assistance Bed Mobility: Supine to Sit     Supine to sit: Supervision;HOB elevated        Transfers Overall transfer level: Needs assistance Equipment used: Rolling walker (2 wheeled) Transfers: Sit to/from [facility] Sit to Stand: Max assist Stand pivot transfers: Mod assist       General transfer comment: once up in standing, min-modA with occasional CGA to maintain static standing. Verbal cues for sequencing of step to gait pattern for safe transfer to chair. one LOB noted, modA to correct, pt endorsed anxiety after, but able to safely turn and sit.  Ambulation/Gait                 Stairs             Wheelchair Mobility    Modified Rankin (Stroke Patients Only)       Balance Overall balance assessment: Needs assistance Sitting-balance support: Feet supported Sitting balance-Leahy Scale: Fair       Standing balance-Leahy Scale: Poor Standing balance comment: BUE support on RW                            Cognition Arousal/Alertness: Awake/alert Behavior During Therapy: WFL for tasks assessed/performed Overall Cognitive Status: No family/caregiver present to determine baseline cognitive functioning                                 General Comments: Pt A&O x4  Exercises      General Comments General comments (skin integrity, edema, etc.): bandages on R foot throughout session, shoe donned prior to mobility      Pertinent Vitals/Pain Pain Assessment: 0-10 Pain Score: 8  Pain Location: R gangrenous toe Pain Descriptors / Indicators: Grimacing;Discomfort;Stabbing Pain Intervention(s): Limited activity within patient's tolerance;Monitored during session;Repositioned    Home Living                      Prior Function            PT Goals (current goals can now be found in the care plan section) Progress towards PT goals: Progressing toward  goals    Frequency    Min 2X/week      PT Plan Current plan remains appropriate    Co-evaluation              AM-PAC PT "6 Clicks" Mobility   Outcome Measure  Help needed turning from your back to your side while in a flat bed without using bedrails?: A Little Help needed moving from lying on your back to sitting on the side of a flat bed without using bedrails?: A Little Help needed moving to and from a bed to a chair (including a wheelchair)?: A Lot Help needed standing up from a chair using your arms (e.g., wheelchair or bedside chair)?: A Lot Help needed to walk in hospital room?: A Lot Help needed climbing 3-5 steps with a railing? : Total 6 Click Score: 13    End of Session Equipment Utilized During Treatment: Gait belt Activity Tolerance: Patient tolerated treatment well Patient left: with call bell/phone within reach;in chair;with chair alarm set Nurse Communication: Mobility status PT Visit Diagnosis: Unsteadiness on feet (R26.81);Other abnormalities of gait and mobility (R26.89);Muscle weakness (generalized) (M62.81);History of falling (Z91.81);Difficulty in walking, not elsewhere classified (R26.2)     Time: 9323-5573 PT Time Calculation (min) (ACUTE ONLY): 24 min  Charges:  $Therapeutic Exercise: 23-37 mins                     Lieutenant Diego PT, DPT 10:16 AM,12/02/19

## 2019-12-02 NOTE — Plan of Care (Signed)

## 2019-12-02 NOTE — Plan of Care (Signed)
  Problem: Education: Goal: Knowledge of General Education information will improve Description: Including pain rating scale, medication(s)/side effects and non-pharmacologic comfort measures 12/02/2019 1341 by Claudia Pollock, RN Outcome: Adequate for Discharge 12/02/2019 0905 by Claudia Pollock, RN Outcome: Progressing   Problem: Health Behavior/Discharge Planning: Goal: Ability to manage health-related needs will improve 12/02/2019 1341 by Claudia Pollock, RN Outcome: Adequate for Discharge 12/02/2019 0905 by Claudia Pollock, RN Outcome: Progressing   Problem: Clinical Measurements: Goal: Ability to maintain clinical measurements within normal limits will improve 12/02/2019 1341 by Claudia Pollock, RN Outcome: Adequate for Discharge 12/02/2019 0905 by Claudia Pollock, RN Outcome: Progressing Goal: Will remain free from infection 12/02/2019 1341 by Claudia Pollock, RN Outcome: Adequate for Discharge 12/02/2019 0905 by Claudia Pollock, RN Outcome: Progressing Goal: Diagnostic test results will improve 12/02/2019 1341 by Claudia Pollock, RN Outcome: Adequate for Discharge 12/02/2019 0905 by Claudia Pollock, RN Outcome: Progressing Goal: Respiratory complications will improve 12/02/2019 1341 by Claudia Pollock, RN Outcome: Adequate for Discharge 12/02/2019 0905 by Claudia Pollock, RN Outcome: Progressing Goal: Cardiovascular complication will be avoided 12/02/2019 1341 by Claudia Pollock, RN Outcome: Adequate for Discharge 12/02/2019 0905 by Claudia Pollock, RN Outcome: Progressing   Problem: Activity: Goal: Risk for activity intolerance will decrease 12/02/2019 1341 by Claudia Pollock, RN Outcome: Adequate for Discharge 12/02/2019 0905 by Claudia Pollock, RN Outcome: Progressing   Problem: Nutrition: Goal: Adequate nutrition will be maintained 12/02/2019 1341 by Claudia Pollock,  RN Outcome: Adequate for Discharge 12/02/2019 0905 by Claudia Pollock, RN Outcome: Progressing   Problem: Coping: Goal: Level of anxiety will decrease 12/02/2019 1341 by Claudia Pollock, RN Outcome: Adequate for Discharge 12/02/2019 0905 by Claudia Pollock, RN Outcome: Progressing   Problem: Elimination: Goal: Will not experience complications related to bowel motility 12/02/2019 1341 by Claudia Pollock, RN Outcome: Adequate for Discharge 12/02/2019 0905 by Claudia Pollock, RN Outcome: Progressing Goal: Will not experience complications related to urinary retention 12/02/2019 1341 by Claudia Pollock, RN Outcome: Adequate for Discharge 12/02/2019 0905 by Claudia Pollock, RN Outcome: Progressing   Problem: Pain Managment: Goal: General experience of comfort will improve 12/02/2019 1341 by Claudia Pollock, RN Outcome: Adequate for Discharge 12/02/2019 0905 by Claudia Pollock, RN Outcome: Progressing   Problem: Safety: Goal: Ability to remain free from injury will improve 12/02/2019 1341 by Claudia Pollock, RN Outcome: Adequate for Discharge 12/02/2019 0905 by Claudia Pollock, RN Outcome: Progressing   Problem: Skin Integrity: Goal: Risk for impaired skin integrity will decrease 12/02/2019 1341 by Claudia Pollock, RN Outcome: Adequate for Discharge 12/02/2019 0905 by Claudia Pollock, RN Outcome: Progressing

## 2019-12-02 NOTE — Progress Notes (Signed)
   12/02/19 0831  Assess: MEWS Score  BP (!) 203/48  Assess: MEWS Score  MEWS Temp 0  MEWS Systolic 2  MEWS Pulse 0  MEWS RR 0  MEWS LOC 0  MEWS Score 2  MEWS Score Color Yellow  Assess: if the MEWS score is Yellow or Red  Were vital signs taken at a resting state? Yes  Focused Assessment No change from prior assessment  Early Detection of Sepsis Score *See Row Information* Low  MEWS guidelines implemented *See Row Information* Yes  Treat  MEWS Interventions Administered scheduled meds/treatments  Take Vital Signs  Increase Vital Sign Frequency  Yellow: Q 2hr X 2 then Q 4hr X 2, if remains yellow, continue Q 4hrs  Escalate  MEWS: Escalate Yellow: discuss with charge nurse/RN and consider discussing with provider and RRT  Notify: Charge Nurse/RN  Name of Charge Nurse/RN Notified Amanda, RN  Date Charge Nurse/RN Notified 12/02/19  Time Charge Nurse/RN Notified [phone]  Document  Patient Outcome Stabilized after interventions  Progress note created (see row info) Yes

## 2019-12-03 ENCOUNTER — Telehealth: Payer: Self-pay

## 2019-12-03 NOTE — Telephone Encounter (Signed)
Transition Care Management Follow-up Telephone Call  Date of discharge and from where: 12/02/2019, [facility]  How have you been since you were released from the hospital? Patient states that he is doing okay. His foot is still painful but doing better.  Any questions or concerns? No  Items Reviewed:  Did the pt receive and understand the discharge instructions provided? Yes   Medications obtained and verified? Yes   Other? No   Any new allergies since your discharge? No   Dietary orders reviewed? Yes  Do you have support at home? Yes   Home Care and Equipment/Supplies: Were home health services ordered? not applicable If so, what is the name of the agency? N/A  Has the agency set up a time to come to the patient's home? not applicable Were any new equipment or medical supplies ordered?  No What is the name of the medical supply agency? N/A Were you able to get the supplies/equipment? not applicable Do you have any questions related to the use of the equipment or supplies? No  Functional Questionnaire: (I = Independent and D = Dependent) ADLs: I  Bathing/Dressing- I  Meal Prep- I  Eating- I  Maintaining continence- I  Transferring/Ambulation- I  Managing Meds- I  Follow up appointments reviewed:   PCP Hospital f/u appt confirmed? Yes  Scheduled to see Dr. Einar Pheasant 12/09/2019 on 12 pm.  [place] [facility] f/u appt confirmed? N/A   Are transportation arrangements needed? No   If their condition worsens, is the pt aware to call PCP or go to the Emergency Dept.? Yes  Was the patient provided with contact information for the PCP's office or ED? Yes  Was to pt encouraged to call back with questions or concerns? Yes

## 2019-12-04 NOTE — Discharge Summary (Signed)
[facility] Discharge Summary   Patient: Peter [name] KLK:[id number]  PCP: Peter Noe, MD  Date of admission: 11/29/2019   Date of discharge: 12/02/2019     Discharge Diagnoses:  Principal diagnosis Acute confusional episode secondary to polypharmacy.  Active Problems:   DM (diabetes mellitus) type II controlled with renal manifestation (HCC)   Chronic kidney disease, stage 3b (HCC)   Peripheral neuropathy   History of amputation of lesser toe of right foot (HCC)   Hx of malignant neoplasm of prostate   Coronary artery disease involving native coronary artery of native heart without angina pectoris   Peripheral vascular disease, unspecified ([place])   Anemia due to stage 3b chronic kidney disease (HCC)   Cellulitis of right lower leg   TIA (transient ischemic attack)   Necrotic toes (HCC)   Atherosclerosis of native arteries of extremities with gangrene, bilateral legs (HCC)   AKI (acute kidney injury) ([place])   Admitted From: Home Disposition:  Home with home health  Recommendations for Outpatient Follow-up:  1. PCP: Follow-up with PCP, follow-up with vascular surgery, follow-up with gastroenterology, follow-up with podiatry. 2. Continue to engage with family regarding goals of care 3. Follow up LABS/TEST: None   Follow-up Information    Peter Noe, MD. Go on 12/09/2019.   Specialty: Family Medicine Why: at 9:00 am Contact information: [place] [address] [phone]        Schedule an appointment as soon as possible for a visit with Peter Bellows, MD.   Specialty: Gastroenterology Why: to schedule EGD colonoscopy.  Contact information: [place] [address] [phone]        Peter [name], DPM. Schedule an appointment as soon as possible for a visit in 1 month.   Specialty: Podiatry Contact information: [place] [address] [phone]              Diet  recommendation: Cardiac diet  Activity: The patient is advised to gradually reintroduce usual activities, as tolerated  Discharge Condition: stable  Code Status: DNR   History of present illness: As per the H and P dictated on admission, "Peter [name] is a 86 y.o. male with medical history significant for Severe PVD status post several stent angioplasties right lower extremity, with left toe amputation and now with gangrene of left second toe, as well as history of dementia, CAD, DM, CKD 3b, with chronic anemia on iron infusions, recently seen by palliative care on 10/11 but not yet decided on hospice, and who also has a history of several TIA like episodes of slurred speech that usually resolve on its own or with hydration, according to his daughter who gives a history who was brought to the emergency room following an episode in which she had 1 of these episodes that was more severe and long-lasting.  According to the daughter who gives the history, patient stop talking and was drooling and not responding.  He started improving however he opted to have 911 call to take him to the hospital.  Daughter who gives the history states that lately his chronic wound on his right foot has been weeping and has a foul odor and they have been offered bypass by Peter [name] however they declined.  She states they will only agree to angioplasty.  Patient who is on dual antiplatelet therapy for his PVD is currently off his aspirin and Plavix for upcoming GI procedure to evaluate the cause of  his anemia."  Hospital Course:  Summary of his active problems in the hospital is as following. Recurrent TIAs. Acute metabolic encephalopathy  CT head nonacute in ED. Drooling and slurred speech resolved on arrival to ED.  Mentation seems to have remained normal.  Carotid Doppler negative. Currently on aspirin and Plavix.  I explained to daughter that it will be difficult to take the patient off of the Plavix in the  setting of recurrent TIAs. Suspect his current presentation is combination of hypotension causing recrudescence of his prior CVA as well as polypharmacy.  Severe PVD complicating right 2nd toe dry gangrene, right foot cellulitis.  Right foot XR without bony abnormality.  He's afebrile without leukocytosis and very mild changes to suggest cellulitis at admission which is improving. LE U/S demonstrated occlusion of the right fem-pop bypass graft. No DVT. Has declined revascularization  Podiatry consulted for amputation, appreciate Peter [name] input.  Not a good idea to amputate the patient as it will have less chance of healing. - Continue antibiotics given cellulitis at admission.  Switch to oral doxycycline. - Continueantiplatelets, statin per vascular surgery recommendations. LDL is 38.   Iron deficiency anemia requiring recurrent infusions, also an element of anemia of chronic disease:  - Pt having dark stools (he is on iron), remains hemodynamically stable FOBT positive. CBC actually trending down.  Received 1 PRBC transfusion. Evidence stable after that time stool. Recommend outpatient follow-up with GI for endoscopy and colonoscopy. Patient will need to stop Plavix for 5 days before procedure.  HTN, CAD:  - Continue imdur to avoid angina. ?not on beta blocker, not on allergies. antiplatelets and statin as above.  T2DM:  Well-controlled with HbA1c 6%.   Stage IIIb CKD:  SCr modestly up from baseline (1.75 from ~1.6) - Avoid nephrotoxins  BPH:  - Continue cardura, finasteride  Dementia: Mild.  - Continue home medications  Goals of care conversation. Had extensive conversation with the family regarding goals of care. Has severe vasculopathy involving PVD with dry gangrene as well as CAD and recurrent TIA.  Needs to be on aspirin and Plavix at least. Had a CABG in May 2021 with severe CAD requiring dual antiplatelet therapy for at least a year SP PCI. Currently has  refused revascularization. Will likely end up getting AKA if the goal is aggressive intervention. Remains in severe pain and is not able to tolerate narcotic pain medication without drop in blood pressure as well as confusion. Condition only complicated by his chronic GI bleed causing iron deficiency anemia requiring iron infusion as well as PRBC transfusion. Unable to tolerate stopping of aspirin and Plavix given his vasculopathy. At present active with [facility] for palliative care but not on hospice. Expected recurrent admission for the patient with worsening pain control. Recommended family to consider option for hospice as multiple medical comorbidities, counteracting each other with limitation of medical therapy.  Patient was ambulatory without any assistance. On the day of the discharge the patient's vitals were stable, and no other acute medical condition were reported by patient. The patient was felt safe to be discharge at Home with Home health.  Consultants: Podiatry, vascular surgery Procedures: None  Discharge Exam: General: Appear in no distress, no Rash; Oral Mucosa Clear, moist. no Abnormal Neck Mass Or lumps, Conjunctiva normal  Cardiovascular: S1 and S2 Present, no Murmur Respiratory: good respiratory effort, Bilateral Air entry present and CTA, no Crackles, no wheezes Abdomen: Bowel Sound present, Soft and no tenderness Extremities: no Pedal edema Neurology: alert and  oriented to time, place, and person affect appropriate. no new focal deficit  Filed Weights   11/29/19 1803  Weight: 74.8 kg   Vitals:   12/02/19 1208 12/02/19 1220  BP: (!) 167/59 (!) 199/45  Pulse: 60   Resp: 19   Temp: (!) 97.5 F (36.4 C)   SpO2: 99%     DISCHARGE MEDICATION: Allergies as of 12/02/2019      Reactions   Penicillins Anaphylaxis   Reported airway compromise 50 years      Medication List    STOP taking these medications   hydrochlorothiazide 12.5 MG tablet Commonly  known as: HYDRODIURIL     TAKE these medications   alendronate 70 MG tablet Commonly known as: FOSAMAX Take 70 mg by mouth every Saturday.   allopurinol 100 MG tablet Commonly known as: ZYLOPRIM TAKE 1 TABLET BY MOUTH EVERY DAY   amLODipine 10 MG tablet Commonly known as: NORVASC TAKE 1 TABLET BY MOUTH EVERY DAY   aspirin EC 81 MG tablet Take 81 mg by mouth every evening.   atorvastatin 80 MG tablet Commonly known as: LIPITOR Take 80 mg by mouth daily.   beta carotene 25000 UNIT capsule Take 25,000 Units by mouth once a week.   clopidogrel 75 MG tablet Commonly known as: PLAVIX TAKE 1 TABLET BY MOUTH EVERY DAY   donepezil 10 MG tablet Commonly known as: ARICEPT Take 10 mg by mouth daily.   doxazosin 4 MG tablet Commonly known as: CARDURA Take 4 mg by mouth daily.   doxycycline 100 MG tablet Commonly known as: VIBRA-TABS Take 1 tablet (100 mg total) by mouth 2 (two) times daily for 5 days.   DULoxetine 30 MG capsule Commonly known as: CYMBALTA Take 1 capsule (30 mg total) by mouth daily.   ferrous sulfate 325 (65 FE) MG tablet TAKE 1 TABLET BY MOUTH EVERY DAY   fidaxomicin 200 MG Tabs tablet Commonly known as: DIFICID Take 200 mg by mouth 2 (two) times daily.   finasteride 5 MG tablet Commonly known as: PROSCAR Take 1 tablet (5 mg total) by mouth daily.   Fish Oil 1000 MG Cpdr Take 1,000 Units by mouth once a week.   Flax Seed Oil 1000 MG Caps Take 1,000 mg by mouth once a week.   folic acid 379 MCG tablet Commonly known as: FOLVITE Take 400 mcg by mouth daily as needed (unsure).   gabapentin 300 MG capsule Commonly known as: NEURONTIN Take 1 capsule (300 mg total) by mouth 2 (two) times daily.   hydrALAZINE 50 MG tablet Commonly known as: APRESOLINE TAKE 1 TABLET BY MOUTH EVERY 8 HOURS What changed: when to take this   Insulin Syringe-Needle U-100 31G X 5/16" 1 ML Misc Commonly known as: TRUEplus Insulin Syringe Use daily as directed.  Dispense needles as prescribed in the past. 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   Lantus 100 UNIT/ML injection Generic drug: insulin glargine Inject 20 Units into the skin at bedtime.   losartan 100 MG tablet Commonly known as: COZAAR TAKE 1 TABLET BY MOUTH EVERY DAY   memantine 10 MG tablet Commonly known as: NAMENDA TAKE 1 TABLET BY MOUTH TWICE A DAY What changed: when to take this   nitroGLYCERIN 0.4 MG SL tablet Commonly known as: NITROSTAT Place 1 tablet (0.4 mg total) under the tongue every 5 (five) minutes x 3 doses as needed for chest  pain.   oxyCODONE-acetaminophen 5-325 MG tablet Commonly known as: PERCOCET/ROXICET Take 1 tablet by mouth every 4 (four) hours as needed for severe pain.   selenium 50 MCG Tabs tablet Take 50 mcg by mouth daily as needed (unknown).   vitamin C 1000 MG tablet Take 1,000 mg by mouth once a week.      Allergies  Allergen Reactions  . Penicillins Anaphylaxis    Reported airway compromise 50 years   Discharge Instructions    Diet - low sodium heart healthy   Complete by: As directed    Increase activity slowly   Complete by: As directed       The results of significant diagnostics from this hospitalization (including imaging, microbiology, ancillary and laboratory) are listed below for reference.    Significant Diagnostic Studies: CT HEAD WO CONTRAST  Result Date: 11/29/2019 CLINICAL DATA:  History of prostate cancer.  TIA. EXAM: CT HEAD WITHOUT CONTRAST TECHNIQUE: Contiguous axial images were obtained from the base of the skull through the vertex without intravenous contrast. COMPARISON:  None. FINDINGS: Brain: There is atrophy and chronic small vessel disease changes. No acute intracranial abnormality. Specifically, no hemorrhage, hydrocephalus, mass lesion, acute infarction, or significant intracranial injury. Vascular: No hyperdense  vessel or unexpected calcification. Skull: No acute calvarial abnormality. Sinuses/Orbits: Visualized paranasal sinuses and mastoids clear. Orbital soft tissues unremarkable. Other: None IMPRESSION: Atrophy, chronic microvascular disease. No acute intracranial abnormality. For Electronically Signed   By: Rolm Baptise M.D.   On: 11/29/2019 18:42   US Carotid Bilateral (at [facility] and [facility] only)  Result Date: 12/01/2019 CLINICAL DATA:  86 year old male with a history of TIA EXAM: BILATERAL CAROTID DUPLEX ULTRASOUND TECHNIQUE: Pearline Cables scale imaging, color Doppler and duplex ultrasound were performed of bilateral carotid and vertebral arteries in the neck. COMPARISON:  None. FINDINGS: Criteria: Quantification of carotid stenosis is based on velocity parameters that correlate the residual internal carotid diameter with NASCET-based stenosis levels, using the diameter of the distal internal carotid lumen as the denominator for stenosis measurement. The following velocity measurements were obtained: RIGHT ICA:  Systolic 80 cm/sec, Diastolic 19 cm/sec CCA:  82 cm/sec SYSTOLIC ICA/CCA RATIO:  1.0 ECA:  251 cm/sec LEFT ICA:  Systolic 315 cm/sec, Diastolic 36 cm/sec CCA:  71 cm/sec SYSTOLIC ICA/CCA RATIO:  1.9 ECA:  306 cm/sec Right Brachial SBP: Not acquired Left Brachial SBP: Not acquired RIGHT CAROTID ARTERY: No significant calcifications of the right common carotid artery. Intermediate waveform maintained. Moderate heterogeneous and partially calcified plaque at the right carotid bifurcation. No significant lumen shadowing. Low resistance waveform of the right ICA. No significant tortuosity. RIGHT VERTEBRAL ARTERY: Antegrade flow with low resistance waveform. LEFT CAROTID ARTERY: No significant calcifications of the left common carotid artery. Intermediate waveform maintained. Moderate heterogeneous and partially calcified plaque at the left carotid bifurcation. No significant lumen shadowing. Low resistance waveform of the  left ICA. No significant tortuosity. LEFT VERTEBRAL ARTERY:  Antegrade flow with low resistance waveform. IMPRESSION: Color duplex indicates moderate heterogeneous and calcified plaque, with no hemodynamically significant stenosis by duplex criteria in the extracranial cerebrovascular circulation. Signed, Dulcy Fanny. Dellia Nims, RPVI [facility] Radiology Electronically Signed   By: Corrie Mckusick D.O.   On: 12/01/2019 08:08   PERIPHERAL VASCULAR CATHETERIZATION  Result Date: 11/09/2019 See op note  US Venous Img Lower Unilateral Right  Result Date: 11/30/2019 CLINICAL DATA:  Redness and swelling in the right foot tonight. Necrotic second toe. History of pain, edema, color changes,  prostate cancer, anticoagulation therapy. EXAM: Right LOWER EXTREMITY VENOUS DOPPLER ULTRASOUND TECHNIQUE: Gray-scale sonography with compression, as well as color and duplex ultrasound, were performed to evaluate the deep venous system(s) from the level of the common femoral vein through the popliteal and proximal calf veins. COMPARISON:  None. FINDINGS: VENOUS Normal compressibility of the common femoral, superficial femoral, and popliteal veins, as well as the visualized calf veins. Visualized portions of profunda femoral vein and great saphenous vein unremarkable. No filling defects to suggest DVT on grayscale or color Doppler imaging. Doppler waveforms show normal direction of venous flow, normal respiratory plasticity and response to augmentation. Limited views of the contralateral common femoral vein are unremarkable. OTHER Incidental finding of what appears to be a right femoral to popliteal arterial bypass graft. The arterial system was not assessed in its entirety on this venous study, but there is evidence of graft occlusion or thrombosis. Limitations: none IMPRESSION: 1. No evidence of deep venous thrombosis in the right lower extremity. 2. Incidental finding of what  appears to be an occluded right femoral to popliteal arterial bypass graft. Electronically Signed   By: Lucienne Capers M.D.   On: 11/30/2019 00:14   DG Foot Complete Right  Result Date: 11/29/2019 CLINICAL DATA:  Right foot redness, swelling, necrotic 2nd toe EXAM: RIGHT FOOT COMPLETE - 3+ VIEW COMPARISON:  10/06/2019 FINDINGS: Prior transmetatarsal amputation of the 3rd and 4th toes. No bone destruction to suggest active acute osteomyelitis. No acute bony abnormality. Specifically, no fracture, subluxation, or dislocation. IMPRESSION: No acute bony abnormality. Electronically Signed   By: Rolm Baptise M.D.   On: 11/29/2019 22:45    Microbiology: Recent Results (from the past 240 hour(s))  Respiratory Panel by RT PCR (Flu A&B, Covid) - Nasopharyngeal Swab     Status: None   Collection Time: 11/29/19 11:31 PM   Specimen: Nasopharyngeal Swab  Result Value Ref Range Status   SARS Coronavirus 2 by RT PCR NEGATIVE NEGATIVE Final    Comment: (NOTE) SARS-CoV-2 target nucleic acids are NOT DETECTED.  The SARS-CoV-2 RNA is generally detectable in upper respiratoy specimens during the acute phase of infection. The lowest concentration of SARS-CoV-2 viral copies this assay can detect is 131 copies/mL. A negative result does not preclude SARS-Cov-2 infection and should not be used as the sole basis for treatment or other patient management decisions. A negative result may occur with  improper specimen collection/handling, submission of specimen other than nasopharyngeal swab, presence of viral mutation(s) within the areas targeted by this assay, and inadequate number of viral copies (<131 copies/mL). A negative result must be combined with clinical observations, patient history, and epidemiological information. The expected result is Negative.  Fact Sheet for Patients:  PinkCheek.be  Fact Sheet for Healthcare Providers:   GravelBags.it  This test is no t yet approved or cleared by the [place] FDA and  has been authorized for detection and/or diagnosis of SARS-CoV-2 by FDA under an Emergency Use Authorization (EUA). This EUA will remain  in effect (meaning this test can be used) for the duration of the COVID-19 declaration under Section 564(b)(1) of the Act, 21 U.S.C. section 360bbb-3(b)(1), unless the authorization is terminated or revoked sooner.     Influenza A by PCR NEGATIVE NEGATIVE Final   Influenza B by PCR NEGATIVE NEGATIVE Final    Comment: (NOTE) The Xpert Xpress SARS-CoV-2/FLU/RSV assay is intended as an aid in  the diagnosis of influenza from Nasopharyngeal swab specimens and  should not be used as a  sole basis for treatment. Nasal washings and  aspirates are unacceptable for Xpert Xpress SARS-CoV-2/FLU/RSV  testing.  Fact Sheet for Patients: PinkCheek.be  Fact Sheet for Healthcare Providers: GravelBags.it  This test is not yet approved or cleared by the [place] FDA and  has been authorized for detection and/or diagnosis of SARS-CoV-2 by  FDA under an Emergency Use Authorization (EUA). This EUA will remain  in effect (meaning this test can be used) for the duration of the  Covid-19 declaration under Section 564(b)(1) of the Act, 21  U.S.C. section 360bbb-3(b)(1), unless the authorization is  terminated or revoked. Performed at [facility], [address] [place], [place] [address]      Labs: CBC: Recent Labs  Lab 11/29/19 1849 11/30/19 1508 12/01/19 0537 12/01/19 1728 12/02/19 0502 12/02/19 1211 12/02/19 1738  WBC 9.1   < > 8.4 6.6 7.1 8.2 7.6  NEUTROABS 7.5  --   --   --   --   --   --   HGB 8.1*   < > 7.5* 6.8* 8.3* 8.6* 9.3*  HCT 27.2*   < > 23.8* 21.9* 25.8* 26.5* 29.3*  MCV 84.0   < > 81.0 81.7 81.1 80.5 81.4  PLT 433*   < > 379 313 321 312 332   < > =  values in this interval not displayed.   Basic Metabolic Panel: Recent Labs  Lab 11/29/19 1849 12/01/19 0537 12/02/19 0502  NA 139 138 138  K 4.7 3.9 4.0  CL 103 105 106  CO2 23 24 23   GLUCOSE 162* 106* 100*  BUN 44* 41* 35*  CREATININE 1.75* 1.52* 1.26*  CALCIUM 8.6* 8.1* 7.8*   Liver Function Tests: Recent Labs  Lab 11/29/19 1849  AST 23  ALT 26  ALKPHOS 91  BILITOT 0.7  PROT 7.2  ALBUMIN 3.6   CBG: Recent Labs  Lab 12/01/19 1151 12/01/19 1544 12/01/19 2119 12/02/19 0900 12/02/19 1203  GLUCAP 182* 178* 202* 90 188*    Time spent: 35 minutes  Signed:  Berle Mull  [facility] 12/02/2019 7:28 PM

## 2019-12-07 ENCOUNTER — Ambulatory Visit: Payer: Medicare Other

## 2019-12-07 ENCOUNTER — Other Ambulatory Visit: Payer: Self-pay | Admitting: Internal Medicine

## 2019-12-07 DIAGNOSIS — F329 Major depressive disorder, single episode, unspecified: Secondary | ICD-10-CM | POA: Diagnosis not present

## 2019-12-07 DIAGNOSIS — I10 Essential (primary) hypertension: Secondary | ICD-10-CM | POA: Diagnosis not present

## 2019-12-07 DIAGNOSIS — G894 Chronic pain syndrome: Secondary | ICD-10-CM | POA: Diagnosis not present

## 2019-12-07 DIAGNOSIS — E1142 Type 2 diabetes mellitus with diabetic polyneuropathy: Secondary | ICD-10-CM | POA: Diagnosis not present

## 2019-12-07 DIAGNOSIS — D509 Iron deficiency anemia, unspecified: Secondary | ICD-10-CM | POA: Diagnosis not present

## 2019-12-07 DIAGNOSIS — M545 Low back pain, unspecified: Secondary | ICD-10-CM | POA: Diagnosis not present

## 2019-12-09 ENCOUNTER — Ambulatory Visit (INDEPENDENT_AMBULATORY_CARE_PROVIDER_SITE_OTHER)
Admission: RE | Admit: 2019-12-09 | Discharge: 2019-12-09 | Disposition: A | Discharge status: Home / Self Care | Payer: Medicare Other | Source: Ambulatory Visit | Attending: Family Medicine | Admitting: Family Medicine

## 2019-12-09 ENCOUNTER — Ambulatory Visit: Payer: Medicare Other

## 2019-12-09 ENCOUNTER — Other Ambulatory Visit: Payer: Self-pay

## 2019-12-09 ENCOUNTER — Encounter: Payer: Self-pay | Admitting: Family Medicine

## 2019-12-09 ENCOUNTER — Ambulatory Visit (INDEPENDENT_AMBULATORY_CARE_PROVIDER_SITE_OTHER): Payer: Medicare Other | Admitting: Family Medicine

## 2019-12-09 ENCOUNTER — Other Ambulatory Visit: Payer: Self-pay | Admitting: Family Medicine

## 2019-12-09 VITALS — BP 142/46 | HR 84 | Temp 97.8°F | Wt 155.5 lb

## 2019-12-09 DIAGNOSIS — I70263 Atherosclerosis of native arteries of extremities with gangrene, bilateral legs: Secondary | ICD-10-CM

## 2019-12-09 DIAGNOSIS — D509 Iron deficiency anemia, unspecified: Secondary | ICD-10-CM

## 2019-12-09 DIAGNOSIS — K5909 Other constipation: Secondary | ICD-10-CM | POA: Insufficient documentation

## 2019-12-09 DIAGNOSIS — G459 Transient cerebral ischemic attack, unspecified: Secondary | ICD-10-CM

## 2019-12-09 DIAGNOSIS — N1832 Chronic kidney disease, stage 3b: Secondary | ICD-10-CM | POA: Diagnosis not present

## 2019-12-09 DIAGNOSIS — K5900 Constipation, unspecified: Secondary | ICD-10-CM | POA: Diagnosis not present

## 2019-12-09 DIAGNOSIS — Z7189 Other specified counseling: Secondary | ICD-10-CM | POA: Diagnosis not present

## 2019-12-09 LAB — BASIC METABOLIC PANEL
BUN: 32 mg/dL — ABNORMAL HIGH (ref 6–23)
CO2: 24 mEq/L (ref 19–32)
Calcium: 8.3 mg/dL — ABNORMAL LOW (ref 8.4–10.5)
Chloride: 105 mEq/L (ref 96–112)
Creatinine, Ser: 1.22 mg/dL (ref 0.40–1.50)
GFR: 53.27 mL/min — ABNORMAL LOW (ref >60.00)
Glucose, Bld: 176 mg/dL — ABNORMAL HIGH (ref 70–99)
Potassium: 4.5 mEq/L (ref 3.5–5.1)
Sodium: 138 mEq/L (ref 135–145)

## 2019-12-09 LAB — CBC
HCT: 25.3 % — ABNORMAL LOW (ref 39.0–52.0)
Hemoglobin: 8.5 g/dL — ABNORMAL LOW (ref 13.0–17.0)
MCHC: 33.7 g/dL (ref 30.0–36.0)
MCV: 78.6 fl (ref 78.0–100.0)
Platelets: 307 10*3/uL (ref 150.0–400.0)
RBC: 3.22 Mil/uL — ABNORMAL LOW (ref 4.22–5.81)
RDW: 17.1 % — ABNORMAL HIGH (ref 11.5–15.5)
WBC: 7.1 10*3/uL (ref 4.0–10.5)

## 2019-12-09 IMAGING — DX DG ABDOMEN 1V
2 series · 2 of 2 positions shown · non-contrast
Comparison: None.

CLINICAL DATA: Constipation

EXAM:
ABDOMEN - 1 VIEW

[abdomen kub (1 of 2)]
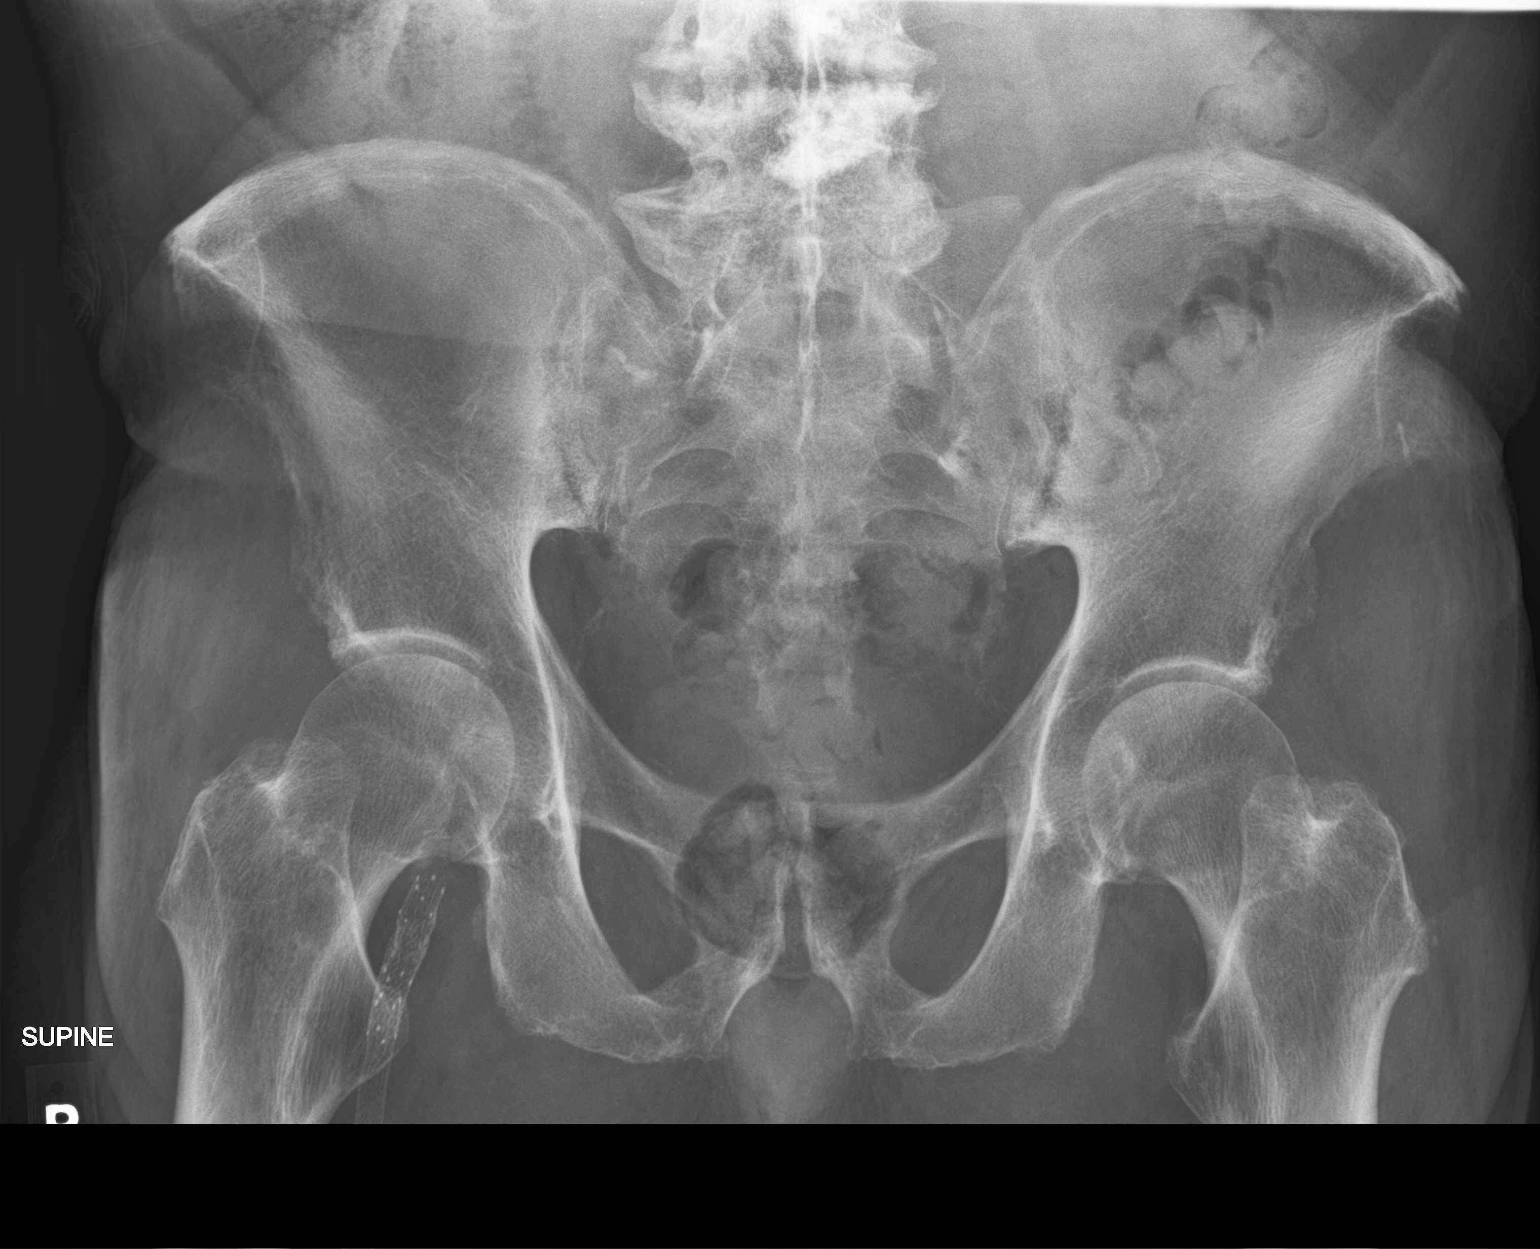

[abdomen kub (2 of 2)]
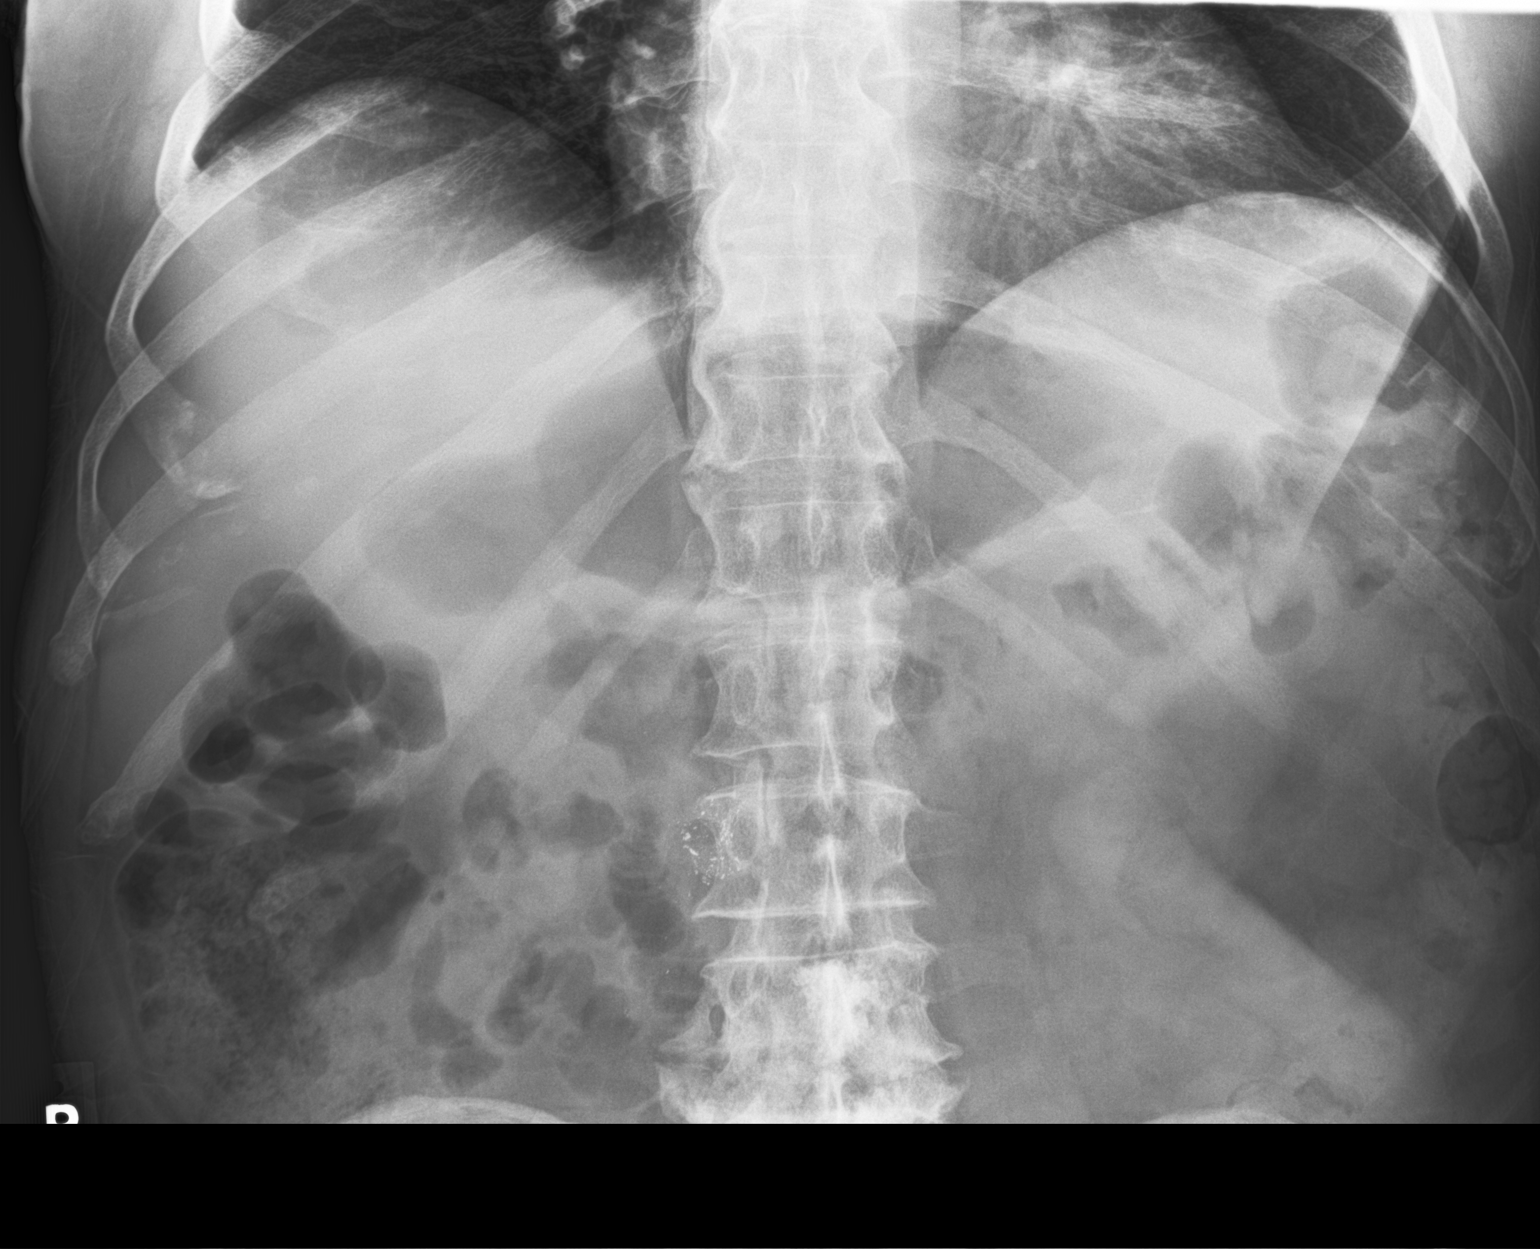

[2 of 2 positions shown; findings below may reference images not displayed]

FINDINGS: Vascular stent in the proximal right thigh. Mild fecal loading in
the colon. No other bony or soft tissue abnormalities identified.
IMPRESSION: Mild fecal loading in the colon.

## 2019-12-09 MED ORDER — DOXYCYCLINE HYCLATE 100 MG PO TABS: 100.0000 mg | ORAL_TABLET | Freq: Two times a day (BID) | ORAL | 0 refills | Status: DC

## 2019-12-09 NOTE — Assessment & Plan Note (Signed)
Continued to discuss goals of care. He is declining some interventions with vascular as well as no longer pursuing colonoscopy. Dicussed that the dark stools will likely continue without getting more information. Brought up hospice but they are happy with palliative and do want to continue to pursue hospital care if needed.

## 2019-12-09 NOTE — Progress Notes (Addendum)
Subjective:     Peter [name] is a 86 y.o. male presenting for Hospitalization Follow-up     HPI  #Gangrene - foot still painful - saw podiatry in the hospital  - going to the same person outpatient - changing the bandage and has home health aid now - who helps - he feels that the toe is getting worse - has noticed that more of his foot is effected -- no worsening redness but more pain in the foot  Canceled the colonoscopy  At this point planning to not pursue  No bright red stool Continues to have dark stools S/p iron transfusion and pRBC  #Abnormal stools - take miralax - constipation and takes medication - then will have loose stools in response - dark stools  Review of Systems   10/18-10/21/2021: Admission - gangrene or left 2nd toe and recurrent TIA episodes. TIA - CT head negative, speech improved, neg carotid doppler - suspected 2/2 to hypotension and polypharmacy and temporarily holding plavix. PVD w/ gangrene - no revascularization, no surgery - Abx. Iron deficiency anemia - Postive FOBT 1 unit pRBC. Long discussion about hospice care  Social History   Tobacco Use  Smoking Status Former Smoker  . Packs/day: 0.75  . Years: 12.00  . Pack years: 9.00  . Types: Cigars, Cigarettes  . Quit date: 41  . Years since quitting: 51.8  Smokeless Tobacco Former Systems developer  . Types: Chew  . Quit date: 2016        Objective:    BP Readings from Last 3 Encounters:  12/09/19 (!) 142/46  12/02/19 (!) 199/45  11/17/19 (!) 124/42   Wt Readings from Last 3 Encounters:  12/09/19 155 lb 8 oz (70.5 kg)  11/29/19 165 lb (74.8 kg)  11/17/19 165 lb (74.8 kg)    BP (!) 142/46   Pulse 84   Temp 97.8 F (36.6 C) (Temporal)   Wt 155 lb 8 oz (70.5 kg)   SpO2 99%   BMI 23.64 kg/m    Physical Exam Constitutional:      Appearance: Normal appearance. He is not ill-appearing or diaphoretic.  HENT:     Right Ear: External ear normal.     Left Ear: External ear  normal.  Eyes:     General: No scleral icterus.    Extraocular Movements: Extraocular movements intact.     Conjunctiva/sclera: Conjunctivae normal.  Cardiovascular:     Rate and Rhythm: Normal rate and regular rhythm.     Heart sounds: Murmur heard.   Pulmonary:     Effort: Pulmonary effort is normal. No respiratory distress.     Breath sounds: Normal breath sounds. No wheezing.  Musculoskeletal:     Cervical back: Neck supple.     Comments: Left foot tender to light palpation. Slightly warm to touch and mild swelling  Skin:    General: Skin is warm and dry.     Comments: Left foot:  Toe with gangrene. Great toe with nail injury and erythema. Erythema to the mid foot  Neurological:     Mental Status: He is alert. Mental status is at baseline.  Psychiatric:        Mood and Affect: Mood normal.             Assessment & Plan:   Problem List Items Addressed This Visit      Cardiovascular and Mediastinum   Atherosclerosis of native arteries of the extremities with gangrene [facility])    Reviewed hospital stay -  podiatry/vascular felt surgery was not a good option due to increase risk for poor healing and worsening infection. Pt is not interested in aggressive management at this time. He feels the foot is worse since discharge - on oral abx. Discussed with Dr. Cleda Mccreedy with podiatry - will extend Doxycycline BID. Podiatry next week - anticipating he will need an above the knee amputation in the future. Encouraged him to keep vascular appointment.       Relevant Medications   doxycycline (VIBRA-TABS) 100 MG tablet   TIA (transient ischemic attack)    Cont ASA and plavix. Pt with TIA in setting of holding plavix. Have discussed that he will continue dual anti-platelet therapy and not move forward with colonoscopy at this time due to increased risk for stroke if stopping medication.         Digestive   Chronic constipation    Pt with ongoing black stools and with constipation  followed by diarrhea in response to miralax as well as incontinence. KUB today to assess stool burden and evaluate for other pathology given no longer pursuing colonoscopy due to risk. Advised colace as may be gentler to get soft stools and prn miralax if worsening.       Relevant Orders   DG Abd 1 View     Genitourinary   Chronic kidney disease, stage 3b ([place])   Relevant Orders   Basic metabolic panel     Other   Iron deficiency anemia - Primary    2/2 to GI losses and now no longer pursuing colonoscopy due to risk of stopping plavix. Cont IV iron infusion. Will recheck blood counts.       Relevant Orders   CBC   Counseling regarding advance care planning and goals of care    Continued to discuss goals of care. He is declining some interventions with vascular as well as no longer pursuing colonoscopy. Dicussed that the dark stools will likely continue without getting more information. Brought up hospice but they are happy with palliative and do want to continue to pursue hospital care if needed.         Medication review completed and are accurate as follows  Outpatient Encounter Medications as of 12/09/2019  Medication Sig  . alendronate (FOSAMAX) 70 MG tablet Take 70 mg by mouth every Saturday.   [name] allopurinol (ZYLOPRIM) 100 MG tablet TAKE 1 TABLET BY MOUTH EVERY DAY (Patient taking differently: Take 100 mg by mouth daily. )  . amLODipine (NORVASC) 10 MG tablet TAKE 1 TABLET BY MOUTH EVERY DAY (Patient taking differently: Take 10 mg by mouth daily. )  . Ascorbic Acid (VITAMIN C) 1000 MG tablet Take 1,000 mg by mouth once a week.   [name] aspirin EC 81 MG tablet Take 81 mg by mouth every evening.   [name] atorvastatin (LIPITOR) 80 MG tablet Take 80 mg by mouth daily.  . beta carotene 25000 UNIT capsule Take 25,000 Units by mouth once a week.   . clopidogrel (PLAVIX) 75 MG tablet TAKE 1 TABLET BY MOUTH EVERY DAY  . donepezil (ARICEPT) 10 MG tablet Take 10 mg by mouth daily.   [name] doxazosin  (CARDURA) 4 MG tablet Take 4 mg by mouth daily.  . DULoxetine (CYMBALTA) 30 MG capsule Take 1 capsule (30 mg total) by mouth daily.  . ferrous sulfate 325 (65 FE) MG tablet TAKE 1 TABLET BY MOUTH EVERY DAY (Patient taking differently: Take 325 mg by mouth daily. )  . finasteride (PROSCAR) 5 MG tablet  Take 1 tablet (5 mg total) by mouth daily.  . Flaxseed, Linseed, (FLAX SEED OIL) 1000 MG CAPS Take 1,000 mg by mouth once a week.   . folic acid (FOLVITE) 062 MCG tablet Take 400 mcg by mouth daily as needed (unsure).   . gabapentin (NEURONTIN) 300 MG capsule Take 1 capsule (300 mg total) by mouth 2 (two) times daily.  . hydrALAZINE (APRESOLINE) 50 MG tablet TAKE 1 TABLET BY MOUTH EVERY 8 HOURS  . Insulin Syringe-Needle U-100 (TRUEPLUS INSULIN SYRINGE) 31G X 5/16" 1 ML MISC Use daily as directed. Dispense needles as prescribed in the past. DX: E11.22  . isosorbide mononitrate (IMDUR) 60 MG 24 hr tablet Take 1 tablet (60 mg total) by mouth daily.  [name] LANTUS 100 UNIT/ML injection Inject 20 Units into the skin at bedtime.  [name] losartan (COZAAR) 100 MG tablet TAKE 1 TABLET BY MOUTH EVERY DAY (Patient taking differently: Take 100 mg by mouth daily. )  . memantine (NAMENDA) 10 MG tablet TAKE 1 TABLET BY MOUTH TWICE A DAY (Patient taking differently: Take 10 mg by mouth in the morning and at bedtime. )  . nitroGLYCERIN (NITROSTAT) 0.4 MG SL tablet Place 1 tablet (0.4 mg total) under the tongue every 5 (five) minutes x 3 doses as needed for chest pain.  . Omega-3 Fatty Acids (FISH OIL) 1000 MG CPDR Take 1,000 Units by mouth once a week.   [name] oxyCODONE-acetaminophen (PERCOCET/ROXICET) 5-325 MG tablet Take 1 tablet by mouth every 4 (four) hours as needed for severe pain.  [name] selenium 50 MCG TABS tablet Take 50 mcg by mouth daily as needed (unknown).   Ginger Organ ER 9 MG C12A Take 1 capsule by mouth 2 (two) times daily.  [name] doxycycline (VIBRA-TABS) 100 MG tablet Take 1 tablet (100 mg total) by mouth 2 (two) times daily.   . [DISCONTINUED] fidaxomicin (DIFICID) 200 MG TABS tablet Take 200 mg by mouth 2 (two) times daily.   No facility-administered encounter medications on file as of 12/09/2019.   Due to mobility issues and patient with 2 story home would benefit from stair lift to get up and down stairs at home with assistance. Order placed in separate encounter.   Return in about 4 weeks (around 01/06/2020), or if symptoms worsen or fail to improve.  Lesleigh Noe, MD  This visit occurred during the SARS-CoV-2 public health emergency.  Safety protocols were in place, including screening questions prior to the visit, additional usage of staff PPE, and extensive cleaning of exam room while observing appropriate contact time as indicated for disinfecting solutions.

## 2019-12-09 NOTE — Assessment & Plan Note (Signed)
Pt with ongoing black stools and with constipation followed by diarrhea in response to miralax as well as incontinence. KUB today to assess stool burden and evaluate for other pathology given no longer pursuing colonoscopy due to risk. Advised colace as may be gentler to get soft stools and prn miralax if worsening.

## 2019-12-09 NOTE — Assessment & Plan Note (Signed)
Cont ASA and plavix. Pt with TIA in setting of holding plavix. Have discussed that he will continue dual anti-platelet therapy and not move forward with colonoscopy at this time due to increased risk for stroke if stopping medication.

## 2019-12-09 NOTE — Assessment & Plan Note (Signed)
2/2 to GI losses and now no longer pursuing colonoscopy due to risk of stopping plavix. Cont IV iron infusion. Will recheck blood counts.

## 2019-12-09 NOTE — Patient Instructions (Addendum)
#  Constipation - Would recommend daily Colace  - increase to 2 times daily as needed   I will reach out to the podiatrist and someone from our office or his office will update you

## 2019-12-09 NOTE — Assessment & Plan Note (Addendum)
Reviewed hospital stay - podiatry/vascular felt surgery was not a good option due to increase risk for poor healing and worsening infection. Pt is not interested in aggressive management at this time. He feels the foot is worse since discharge - on oral abx. Discussed with Dr. Cleda Mccreedy with podiatry - will extend Doxycycline BID. Podiatry next week - anticipating he will need an above the knee amputation in the future. Encouraged him to keep vascular appointment.

## 2019-12-10 ENCOUNTER — Telehealth: Payer: Self-pay

## 2019-12-10 ENCOUNTER — Inpatient Hospital Stay: Payer: Medicare Other

## 2019-12-10 DIAGNOSIS — D509 Iron deficiency anemia, unspecified: Secondary | ICD-10-CM | POA: Diagnosis not present

## 2019-12-10 DIAGNOSIS — N1832 Chronic kidney disease, stage 3b: Secondary | ICD-10-CM

## 2019-12-10 LAB — CBC WITH DIFFERENTIAL/PLATELET
Abs Immature Granulocytes: 0.03 10*3/uL (ref 0.00–0.07)
Basophils Absolute: 0 10*3/uL (ref 0.0–0.1)
Basophils Relative: 0 %
Eosinophils Absolute: 0.1 10*3/uL (ref 0.0–0.5)
Eosinophils Relative: 1 %
HCT: 28.2 % — ABNORMAL LOW (ref 39.0–52.0)
Hemoglobin: 9 g/dL — ABNORMAL LOW (ref 13.0–17.0)
Immature Granulocytes: 0 %
Lymphocytes Relative: 20 %
Lymphs Abs: 1.7 10*3/uL (ref 0.7–4.0)
MCH: 26 pg (ref 26.0–34.0)
MCHC: 31.9 g/dL (ref 30.0–36.0)
MCV: 81.5 fL (ref 80.0–100.0)
Monocytes Absolute: 0.5 10*3/uL (ref 0.1–1.0)
Monocytes Relative: 7 %
Neutro Abs: 5.8 10*3/uL (ref 1.7–7.7)
Neutrophils Relative %: 72 %
Platelets: 281 10*3/uL (ref 150–400)
RBC: 3.46 MIL/uL — ABNORMAL LOW (ref 4.22–5.81)
RDW: 16.1 % — ABNORMAL HIGH (ref 11.5–15.5)
WBC: 8.1 10*3/uL (ref 4.0–10.5)
nRBC: 0 % (ref 0.0–0.2)

## 2019-12-10 LAB — RETIC PANEL
Immature Retic Fract: 7.6 % (ref 2.3–15.9)
RBC.: 3.48 MIL/uL — ABNORMAL LOW (ref 4.22–5.81)
Retic Count, Absolute: 75.5 10*3/uL (ref 19.0–186.0)
Retic Ct Pct: 2.2 % (ref 0.4–3.1)
Reticulocyte Hemoglobin: 29.3 pg (ref >27.9)

## 2019-12-10 LAB — IRON AND TIBC
Iron: 30 ug/dL — ABNORMAL LOW (ref 45–182)
Saturation Ratios: 14 % — ABNORMAL LOW (ref 17.9–39.5)
TIBC: 220 ug/dL — ABNORMAL LOW (ref 250–450)
UIBC: 190 ug/dL

## 2019-12-10 LAB — FERRITIN: Ferritin: 89 ng/mL (ref 24–336)

## 2019-12-10 NOTE — Telephone Encounter (Signed)
1002 am.  Phone call made to [name]-daughter of patient to complete a follow up check-in after recent hospitalization and schedule an appointment for next week.  [name] provides an update on patient's condition since our last in-person visit.    Patient's pain is better managed with the Cymbalta.  Daughter notes the oxycodone did cause patient to be more lethargic.   Overall mood has improved and there are not as many angry outburst.   Patient is to be seen next week by podiatry for follow up on gangrenous toe.  Patient is waiting to see if he can be scheduled earlier in the week instead of next Thursday.  Per daughter and MD notes, patient has opted not to pursue aggressive measures of having a by-pass to correct the blood flow to the foot.  They are questioning if a less aggressive treatment could be completed.    Patient will continue with iron infusions due to anemia.   He is currently wheelchair bound and the family is pursing a chair lift that would allow patient to be upstairs.  He is currently working with physical therapy for strength training.  Patient and his wife continue to have a private caregiver in the home to assist and provide supervision.  Palliative Care will tentatively see patient on November 2 at 1 pm.  Daughter will cancel and reschedule if patient is able to be seen that day by podiatry.

## 2019-12-13 ENCOUNTER — Inpatient Hospital Stay: Payer: Medicare Other | Attending: Oncology | Admitting: Oncology

## 2019-12-13 ENCOUNTER — Encounter: Payer: Self-pay | Admitting: Oncology

## 2019-12-13 ENCOUNTER — Other Ambulatory Visit: Payer: Self-pay

## 2019-12-13 ENCOUNTER — Inpatient Hospital Stay: Payer: Medicare Other

## 2019-12-13 VITALS — BP 183/63 | HR 74 | Resp 16

## 2019-12-13 VITALS — BP 145/70 | HR 66 | Temp 97.2°F | Resp 18 | Wt 155.6 lb

## 2019-12-13 DIAGNOSIS — E119 Type 2 diabetes mellitus without complications: Secondary | ICD-10-CM | POA: Diagnosis not present

## 2019-12-13 DIAGNOSIS — Z8546 Personal history of malignant neoplasm of prostate: Secondary | ICD-10-CM | POA: Diagnosis not present

## 2019-12-13 DIAGNOSIS — I2510 Atherosclerotic heart disease of native coronary artery without angina pectoris: Secondary | ICD-10-CM | POA: Insufficient documentation

## 2019-12-13 DIAGNOSIS — N1832 Chronic kidney disease, stage 3b: Secondary | ICD-10-CM | POA: Diagnosis not present

## 2019-12-13 DIAGNOSIS — M359 Systemic involvement of connective tissue, unspecified: Secondary | ICD-10-CM | POA: Diagnosis not present

## 2019-12-13 DIAGNOSIS — D509 Iron deficiency anemia, unspecified: Secondary | ICD-10-CM | POA: Diagnosis not present

## 2019-12-13 DIAGNOSIS — D631 Anemia in chronic kidney disease: Secondary | ICD-10-CM

## 2019-12-13 DIAGNOSIS — Z87891 Personal history of nicotine dependence: Secondary | ICD-10-CM | POA: Diagnosis not present

## 2019-12-13 DIAGNOSIS — Z79899 Other long term (current) drug therapy: Secondary | ICD-10-CM | POA: Diagnosis not present

## 2019-12-13 DIAGNOSIS — E785 Hyperlipidemia, unspecified: Secondary | ICD-10-CM | POA: Diagnosis not present

## 2019-12-13 DIAGNOSIS — K219 Gastro-esophageal reflux disease without esophagitis: Secondary | ICD-10-CM | POA: Diagnosis not present

## 2019-12-13 DIAGNOSIS — Z7902 Long term (current) use of antithrombotics/antiplatelets: Secondary | ICD-10-CM | POA: Insufficient documentation

## 2019-12-13 DIAGNOSIS — I739 Peripheral vascular disease, unspecified: Secondary | ICD-10-CM | POA: Diagnosis not present

## 2019-12-13 DIAGNOSIS — I129 Hypertensive chronic kidney disease with stage 1 through stage 4 chronic kidney disease, or unspecified chronic kidney disease: Secondary | ICD-10-CM | POA: Diagnosis not present

## 2019-12-13 MED ORDER — SODIUM CHLORIDE 0.9 % IV SOLN: 200.0000 mg | Freq: Once | INTRAVENOUS | Status: DC

## 2019-12-13 MED ORDER — SODIUM CHLORIDE 0.9 % IV SOLN
Freq: Once | INTRAVENOUS | Status: AC
  Filled 2019-12-13: qty 250

## 2019-12-13 MED ORDER — IRON SUCROSE 20 MG/ML IV SOLN
200.0000 mg | Freq: Once | INTRAVENOUS | Status: AC
  Administered 2019-12-13: 200 mg via INTRAVENOUS
  Filled 2019-12-13: qty 10
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
## 2019-12-13 NOTE — Progress Notes (Signed)
Pt here for follow up. Pt on antibiotics for right foot infection.

## 2019-12-13 NOTE — Progress Notes (Signed)
Patient tolerated Venofer infusion well. Final BP 183/74. Patient denies any s/s at this time. Dr. Tasia Catchings made aware. Per Dr. Tasia Catchings, patient to check BP at home. If persistently high, patient is to contact PCP to make aware. Educated patient on s/s as to when to seek emergency care.

## 2019-12-14 ENCOUNTER — Ambulatory Visit: Payer: Medicare Other

## 2019-12-14 ENCOUNTER — Other Ambulatory Visit: Payer: Medicare Other

## 2019-12-14 VITALS — BP 130/54 | HR 70 | Resp 18

## 2019-12-14 DIAGNOSIS — Z515 Encounter for palliative care: Secondary | ICD-10-CM

## 2019-12-14 DIAGNOSIS — G894 Chronic pain syndrome: Secondary | ICD-10-CM | POA: Diagnosis not present

## 2019-12-14 DIAGNOSIS — F329 Major depressive disorder, single episode, unspecified: Secondary | ICD-10-CM | POA: Diagnosis not present

## 2019-12-14 DIAGNOSIS — E1142 Type 2 diabetes mellitus with diabetic polyneuropathy: Secondary | ICD-10-CM | POA: Diagnosis not present

## 2019-12-14 DIAGNOSIS — I10 Essential (primary) hypertension: Secondary | ICD-10-CM | POA: Diagnosis not present

## 2019-12-14 DIAGNOSIS — M545 Low back pain, unspecified: Secondary | ICD-10-CM | POA: Diagnosis not present

## 2019-12-14 DIAGNOSIS — D509 Iron deficiency anemia, unspecified: Secondary | ICD-10-CM | POA: Diagnosis not present

## 2019-12-14 NOTE — Progress Notes (Signed)
PATIENT NAME: Peter [name] DOB: [birth year] MRN: [id number]  PRIMARY CARE PROVIDER: Lesleigh Noe, MD  RESPONSIBLE PARTY:  Acct ID - Guarantor Home Phone Work Phone Relationship Acct Type  [id number] - Yeldell,LAW* [phone]  Self P/F     [address], [place], [place] [address]    PLAN OF CARE and INTERVENTIONS:               1.  GOALS OF CARE/ ADVANCE CARE PLANNING:  To remain at home with the assistance of wife and private caregivers who are in the home 7 days a week for 6 hours each day.               2.  PATIENT/CAREGIVER EDUCATION:  Stool softener and Plavix               3.  DISEASE STATUS:  Joint visit with [place], SW.  Greeted at the door by patient's wife.  Patient is found sitting on the sofa.  He has a rolling walker beside him.  Patient reports starting PT today and is hopeful to increase the frequency as PT is only coming once a week.    Patient provides updates on his recent hospitalization and desire to continue with colonoscopy.  Patient was seen by oncology and cardiology.  He reports getting clearance to continue with procedure.  We have discussed the use of plavix and the risk vs benefits.  At this time, patient and his wife feel having the colonoscopy to determine cause of his GI bleed would be beneficial.  I have advised they follow up with GI to further discuss proceeding with the colonoscopy.  Patient continues to have issues with constipation vs diarrhea.  I have reviewed recommendations that were given to him by Dr. Einar Pheasant.  Wife states they have not been able to pick colace but she will have the caregiver assist with this.  I have provided education on use of colace and using miralax prn for constipation.  Patient continues to have issues with pain.  He notes pain is in the lower back and right leg.  He was started on cymbalta during the recent hospitalization and notes this has been helpful.  He currently has percocet in the home but is hesitant to utilize this  due to oversedation.  I have advised that I would follow up with Amy, NP for Palliative Care regarding recommendations for something topical such as Voltaren gel.  Patient notes pain is more significant as the day progresses and feels something would be helpful at night.  He is hopeful for a topical cream to "calm the discomfort" in the right leg and lower back.  Patient discusses being hopeful to get back to his normal self.  He notes being more subdued and attributes this to medication changes and also adjusting to chronic medical conditions.  He continues with an iron infusion and reports a blood transfusion while being in the hospital.  He feels that it is too soon to really notice a difference but is hopeful after the 2nd or 3rd iron infusion he will be able to see a difference.   HISTORY OF PRESENT ILLNESS:  86 year old male with Chronic Pain, HTN, PAD and mild dementia.  Patient is being followed by Palliative Care monthly and PRN.  CODE STATUS: DNR ADVANCED DIRECTIVES: Yes MOST FORM: No PPS: 50%   PHYSICAL EXAM:   VITALS:  BP 130/54 P 70 R 18 O2 Sats 97 LUNGS: CTA CARDIAC: HRR EXTREMITIES:  Trace edema to right lower extremity SKIN: Warm and dry to touch.  Patient reports gangrene to toe.  NEURO: Alert and oriented with some forgetfulness       Lorenza Burton, RN

## 2019-12-14 NOTE — Progress Notes (Signed)
COMMUNITY PALLIATIVE CARE SW NOTE  PATIENT NAME: Reyaan Thoma DOB: [birth year] MRN: [id number]  PRIMARY CARE PROVIDER: Lesleigh Noe, MD  RESPONSIBLE PARTY:  Acct ID - Guarantor Home Phone Work Phone Relationship Acct Type  [id number] - Kwolek,LAW* [phone]  Self P/F     [place], [place], [place] [address]     PLAN OF CARE and INTERVENTIONS:             1.         GOALS OF CARE/ ADVANCE CARE PLANNING:  Patient wishes to be DNR. MOST form to be completed. Patient has [place]. Patient's goal is pain management. 2.         SOCIAL/EMOTIONAL/SPIRITUAL ASSESSMENT/ INTERVENTIONS:  SW and RN met with patient and patients wife. Patients caregiver also present. Patient and wife provided overview and update on patients care and changes. Patient had recent hosptialization due to acute confusional episode secondary to polypharmacy. Patient continues to have chronic pain in his leg, lower back and foot of which has a toe that has gangrene. Palliative care NP has made visits with patient to assist with pain management. Patient tried morphine, oxycodone, percocet and now cymbalta with no avail. Patient currently taking gabapentin, which is not helpful per patient. Patient has had one fall since last visit. Patient continues to suffer from diarrhea and constipation. Patient states that having diarhea is an integrity issue for him, due to not being able to make it to the restroom in time all the time. RN discussed the use of colace. Patient has podiatry appointment this Thu about his foot with gangrene and a vascular appt on appt. Patient shares that he received a B12 and iron infusion, he is not sure if he has benefited from them as of yet, however wife shares that he is able to do more now than before. Patient is receiving [facility].  Palliative care will continue to monitor and assist with long term care planning as needed.  3.         PATIENT/CAREGIVER EDUCATION/ COPING:  Patient alert and oriented and  engaged in conversation. Patient does have dx of dementia but does not show symptoms of memory concerns concerns/issue during visit. Patient was mild tempered during and not has agitated as previous visit.. Patients family continues to be supportive. 4.         PERSONAL EMERGENCY PLAN:  Family will call 9-1-1 for emergencies.  5.         COMMUNITY RESOURCES COORDINATION/ HEALTH CARE NAVIGATION:  [name] and wife manages care. Patient has private duty caregivers though [place] 7 days a week 12pm-6pm. 6.         FINANCIAL/LEGAL CONCERNS/INTERVENTIONS:  None.      SOCIAL HX:  Social History   Tobacco Use   Smoking status: Former Smoker    Packs/day: 0.75    Years: 12.00    Pack years: 9.00    Types: Cigars, Cigarettes    Quit date: 1970    Years since quitting: 51.8   Smokeless tobacco: Former Systems developer    Types: [place] date: 2016  Substance Use Topics   Alcohol use: Yes    Comment: occasional    CODE STATUS: wishes to be DNR  ADVANCED DIRECTIVES: Y MOST FORM COMPLETE:  To be discussed. HOSPICE EDUCATION PROVIDED: N  PPS: Patient is ambulatory with rollator and requires MIN-A with ADL's. Caregiver does meal prep.  Time spent: 45 min.  Doreene Eland, [place]

## 2019-12-15 ENCOUNTER — Encounter: Payer: Self-pay | Admitting: Family Medicine

## 2019-12-15 ENCOUNTER — Other Ambulatory Visit: Payer: Self-pay

## 2019-12-15 DIAGNOSIS — F0390 Unspecified dementia without behavioral disturbance: Secondary | ICD-10-CM

## 2019-12-15 DIAGNOSIS — R2689 Other abnormalities of gait and mobility: Secondary | ICD-10-CM

## 2019-12-15 DIAGNOSIS — Z89421 Acquired absence of other right toe(s): Secondary | ICD-10-CM

## 2019-12-15 DIAGNOSIS — I70263 Atherosclerosis of native arteries of extremities with gangrene, bilateral legs: Secondary | ICD-10-CM

## 2019-12-15 DIAGNOSIS — I739 Peripheral vascular disease, unspecified: Secondary | ICD-10-CM

## 2019-12-15 DIAGNOSIS — G63 Polyneuropathy in diseases classified elsewhere: Secondary | ICD-10-CM

## 2019-12-16 ENCOUNTER — Ambulatory Visit: Payer: Medicare Other

## 2019-12-16 DIAGNOSIS — I10 Essential (primary) hypertension: Secondary | ICD-10-CM | POA: Diagnosis not present

## 2019-12-16 DIAGNOSIS — I739 Peripheral vascular disease, unspecified: Secondary | ICD-10-CM | POA: Diagnosis not present

## 2019-12-16 DIAGNOSIS — I96 Gangrene, not elsewhere classified: Secondary | ICD-10-CM | POA: Diagnosis not present

## 2019-12-16 DIAGNOSIS — M545 Low back pain, unspecified: Secondary | ICD-10-CM | POA: Diagnosis not present

## 2019-12-16 DIAGNOSIS — Z89421 Acquired absence of other right toe(s): Secondary | ICD-10-CM | POA: Diagnosis not present

## 2019-12-16 DIAGNOSIS — F329 Major depressive disorder, single episode, unspecified: Secondary | ICD-10-CM | POA: Diagnosis not present

## 2019-12-16 DIAGNOSIS — G894 Chronic pain syndrome: Secondary | ICD-10-CM | POA: Diagnosis not present

## 2019-12-16 DIAGNOSIS — B351 Tinea unguium: Secondary | ICD-10-CM | POA: Diagnosis not present

## 2019-12-16 DIAGNOSIS — D509 Iron deficiency anemia, unspecified: Secondary | ICD-10-CM | POA: Diagnosis not present

## 2019-12-16 DIAGNOSIS — E1142 Type 2 diabetes mellitus with diabetic polyneuropathy: Secondary | ICD-10-CM | POA: Diagnosis not present

## 2019-12-16 NOTE — Telephone Encounter (Signed)
DME order placed. If needed sent clinic note from 12/09/2019

## 2019-12-20 ENCOUNTER — Ambulatory Visit (INDEPENDENT_AMBULATORY_CARE_PROVIDER_SITE_OTHER): Payer: Medicare Other | Admitting: Vascular Surgery

## 2019-12-20 DIAGNOSIS — F329 Major depressive disorder, single episode, unspecified: Secondary | ICD-10-CM | POA: Diagnosis not present

## 2019-12-20 DIAGNOSIS — I10 Essential (primary) hypertension: Secondary | ICD-10-CM | POA: Diagnosis not present

## 2019-12-20 DIAGNOSIS — E1142 Type 2 diabetes mellitus with diabetic polyneuropathy: Secondary | ICD-10-CM | POA: Diagnosis not present

## 2019-12-20 DIAGNOSIS — G894 Chronic pain syndrome: Secondary | ICD-10-CM | POA: Diagnosis not present

## 2019-12-20 DIAGNOSIS — M545 Low back pain, unspecified: Secondary | ICD-10-CM | POA: Diagnosis not present

## 2019-12-20 DIAGNOSIS — D509 Iron deficiency anemia, unspecified: Secondary | ICD-10-CM | POA: Diagnosis not present

## 2019-12-21 ENCOUNTER — Ambulatory Visit: Payer: Medicare Other

## 2019-12-21 ENCOUNTER — Other Ambulatory Visit: Payer: Self-pay

## 2019-12-21 ENCOUNTER — Inpatient Hospital Stay: Payer: Medicare Other

## 2019-12-21 VITALS — BP 180/69 | HR 80 | Resp 20

## 2019-12-21 DIAGNOSIS — D631 Anemia in chronic kidney disease: Secondary | ICD-10-CM | POA: Diagnosis not present

## 2019-12-21 DIAGNOSIS — N1832 Chronic kidney disease, stage 3b: Secondary | ICD-10-CM

## 2019-12-21 DIAGNOSIS — Z8546 Personal history of malignant neoplasm of prostate: Secondary | ICD-10-CM | POA: Diagnosis not present

## 2019-12-21 DIAGNOSIS — M359 Systemic involvement of connective tissue, unspecified: Secondary | ICD-10-CM | POA: Diagnosis not present

## 2019-12-21 DIAGNOSIS — I739 Peripheral vascular disease, unspecified: Secondary | ICD-10-CM | POA: Diagnosis not present

## 2019-12-21 DIAGNOSIS — D649 Anemia, unspecified: Secondary | ICD-10-CM

## 2019-12-21 DIAGNOSIS — I2510 Atherosclerotic heart disease of native coronary artery without angina pectoris: Secondary | ICD-10-CM | POA: Diagnosis not present

## 2019-12-21 MED ORDER — IRON SUCROSE 20 MG/ML IV SOLN
200.0000 mg | Freq: Once | INTRAVENOUS | Status: AC
  Administered 2019-12-21: 200 mg via INTRAVENOUS
  Filled 2019-12-21: qty 10

## 2019-12-21 MED ORDER — SODIUM CHLORIDE 0.9 % IV SOLN
Freq: Once | INTRAVENOUS | Status: AC
  Filled 2019-12-21: qty 250

## 2019-12-21 MED ORDER — NA SULFATE-K SULFATE-MG SULF 17.5-3.13-1.6 GM/177ML PO SOLN: 1.0000 | Freq: Once | ORAL | 0 refills | Status: AC

## 2019-12-21 MED ORDER — SODIUM CHLORIDE 0.9 % IV SOLN: 200.0000 mg | Freq: Once | INTRAVENOUS | Status: DC

## 2019-12-22 ENCOUNTER — Other Ambulatory Visit: Payer: Self-pay | Admitting: Family Medicine

## 2019-12-22 DIAGNOSIS — I10 Essential (primary) hypertension: Secondary | ICD-10-CM | POA: Diagnosis not present

## 2019-12-22 DIAGNOSIS — F329 Major depressive disorder, single episode, unspecified: Secondary | ICD-10-CM | POA: Diagnosis not present

## 2019-12-22 DIAGNOSIS — G894 Chronic pain syndrome: Secondary | ICD-10-CM | POA: Diagnosis not present

## 2019-12-22 DIAGNOSIS — M545 Low back pain, unspecified: Secondary | ICD-10-CM | POA: Diagnosis not present

## 2019-12-22 DIAGNOSIS — D509 Iron deficiency anemia, unspecified: Secondary | ICD-10-CM | POA: Diagnosis not present

## 2019-12-22 DIAGNOSIS — F321 Major depressive disorder, single episode, moderate: Secondary | ICD-10-CM

## 2019-12-22 DIAGNOSIS — E1142 Type 2 diabetes mellitus with diabetic polyneuropathy: Secondary | ICD-10-CM | POA: Diagnosis not present

## 2019-12-22 DIAGNOSIS — G63 Polyneuropathy in diseases classified elsewhere: Secondary | ICD-10-CM

## 2019-12-23 ENCOUNTER — Ambulatory Visit: Payer: Medicare Other

## 2019-12-25 ENCOUNTER — Other Ambulatory Visit: Payer: Self-pay | Admitting: Internal Medicine

## 2019-12-26 DIAGNOSIS — E1142 Type 2 diabetes mellitus with diabetic polyneuropathy: Secondary | ICD-10-CM | POA: Diagnosis not present

## 2019-12-26 DIAGNOSIS — G894 Chronic pain syndrome: Secondary | ICD-10-CM | POA: Diagnosis not present

## 2019-12-26 DIAGNOSIS — Z8673 Personal history of transient ischemic attack (TIA), and cerebral infarction without residual deficits: Secondary | ICD-10-CM | POA: Diagnosis not present

## 2019-12-26 DIAGNOSIS — Z87891 Personal history of nicotine dependence: Secondary | ICD-10-CM | POA: Diagnosis not present

## 2019-12-26 DIAGNOSIS — Z951 Presence of aortocoronary bypass graft: Secondary | ICD-10-CM | POA: Diagnosis not present

## 2019-12-26 DIAGNOSIS — D509 Iron deficiency anemia, unspecified: Secondary | ICD-10-CM | POA: Diagnosis not present

## 2019-12-26 DIAGNOSIS — R2689 Other abnormalities of gait and mobility: Secondary | ICD-10-CM | POA: Diagnosis not present

## 2019-12-26 DIAGNOSIS — I2510 Atherosclerotic heart disease of native coronary artery without angina pectoris: Secondary | ICD-10-CM | POA: Diagnosis not present

## 2019-12-26 DIAGNOSIS — N1832 Chronic kidney disease, stage 3b: Secondary | ICD-10-CM | POA: Diagnosis not present

## 2019-12-26 DIAGNOSIS — Z794 Long term (current) use of insulin: Secondary | ICD-10-CM | POA: Diagnosis not present

## 2019-12-26 DIAGNOSIS — F329 Major depressive disorder, single episode, unspecified: Secondary | ICD-10-CM | POA: Diagnosis not present

## 2019-12-26 DIAGNOSIS — I739 Peripheral vascular disease, unspecified: Secondary | ICD-10-CM | POA: Diagnosis not present

## 2019-12-26 DIAGNOSIS — F0390 Unspecified dementia without behavioral disturbance: Secondary | ICD-10-CM | POA: Diagnosis not present

## 2019-12-26 DIAGNOSIS — Z7984 Long term (current) use of oral hypoglycemic drugs: Secondary | ICD-10-CM | POA: Diagnosis not present

## 2019-12-26 DIAGNOSIS — I129 Hypertensive chronic kidney disease with stage 1 through stage 4 chronic kidney disease, or unspecified chronic kidney disease: Secondary | ICD-10-CM | POA: Diagnosis not present

## 2019-12-28 ENCOUNTER — Inpatient Hospital Stay: Payer: Medicare Other

## 2019-12-28 ENCOUNTER — Other Ambulatory Visit
Admission: RE | Admit: 2019-12-28 | Discharge: 2019-12-28 | Disposition: A | Discharge status: Home / Self Care | Payer: Medicare Other | Source: Ambulatory Visit | Attending: Gastroenterology | Admitting: Gastroenterology

## 2019-12-28 ENCOUNTER — Ambulatory Visit: Payer: Medicare Other

## 2019-12-28 ENCOUNTER — Other Ambulatory Visit: Payer: Self-pay

## 2019-12-28 VITALS — BP 160/54 | HR 72 | Temp 96.0°F

## 2019-12-28 DIAGNOSIS — E1142 Type 2 diabetes mellitus with diabetic polyneuropathy: Secondary | ICD-10-CM | POA: Diagnosis not present

## 2019-12-28 DIAGNOSIS — Z01812 Encounter for preprocedural laboratory examination: Secondary | ICD-10-CM | POA: Diagnosis not present

## 2019-12-28 DIAGNOSIS — Z20822 Contact with and (suspected) exposure to covid-19: Secondary | ICD-10-CM | POA: Diagnosis not present

## 2019-12-28 DIAGNOSIS — D509 Iron deficiency anemia, unspecified: Secondary | ICD-10-CM | POA: Diagnosis not present

## 2019-12-28 DIAGNOSIS — M359 Systemic involvement of connective tissue, unspecified: Secondary | ICD-10-CM | POA: Diagnosis not present

## 2019-12-28 DIAGNOSIS — D631 Anemia in chronic kidney disease: Secondary | ICD-10-CM | POA: Diagnosis not present

## 2019-12-28 DIAGNOSIS — N1832 Chronic kidney disease, stage 3b: Secondary | ICD-10-CM | POA: Diagnosis not present

## 2019-12-28 DIAGNOSIS — Z8546 Personal history of malignant neoplasm of prostate: Secondary | ICD-10-CM | POA: Diagnosis not present

## 2019-12-28 DIAGNOSIS — I2510 Atherosclerotic heart disease of native coronary artery without angina pectoris: Secondary | ICD-10-CM | POA: Diagnosis not present

## 2019-12-28 DIAGNOSIS — I739 Peripheral vascular disease, unspecified: Secondary | ICD-10-CM | POA: Diagnosis not present

## 2019-12-28 DIAGNOSIS — I129 Hypertensive chronic kidney disease with stage 1 through stage 4 chronic kidney disease, or unspecified chronic kidney disease: Secondary | ICD-10-CM | POA: Diagnosis not present

## 2019-12-28 MED ORDER — IRON SUCROSE 20 MG/ML IV SOLN
200.0000 mg | Freq: Once | INTRAVENOUS | Status: AC
  Administered 2019-12-28: 200 mg via INTRAVENOUS
  Filled 2019-12-28: qty 10

## 2019-12-28 MED ORDER — SODIUM CHLORIDE 0.9 % IV SOLN
Freq: Once | INTRAVENOUS | Status: AC
  Filled 2019-12-28: qty 250

## 2019-12-28 MED ORDER — SODIUM CHLORIDE 0.9 % IV SOLN: 200.0000 mg | Freq: Once | INTRAVENOUS | Status: DC

## 2019-12-28 NOTE — Progress Notes (Signed)
Pt tolerated Venofer well with no complications. Final BP 160/54. Pt has no complaints at this time and states he "feels fine" Pt states he "can monitor his BP at home and he takes BP medications at home in the evening as well. Pt aware of s/s of when to seek emergency care. Pt stable for discharge. Pt discharged with caregiver.   Shemicka Cohrs [employer]

## 2019-12-29 ENCOUNTER — Other Ambulatory Visit: Payer: Self-pay | Admitting: Family Medicine

## 2019-12-29 LAB — SARS CORONAVIRUS 2 (TAT 6-24 HRS): SARS Coronavirus 2: NEGATIVE

## 2019-12-29 NOTE — Telephone Encounter (Signed)
Last office visit 12/09/2019 for hospital follow up.  Sertraline is not on current medication list.  Refill?

## 2019-12-30 ENCOUNTER — Encounter: Payer: Self-pay | Admitting: Certified Registered"

## 2019-12-30 ENCOUNTER — Other Ambulatory Visit: Payer: Self-pay

## 2019-12-30 ENCOUNTER — Inpatient Hospital Stay (HOSPITAL_COMMUNITY)
Admission: EM | Admit: 2019-12-30 | Discharge: 2020-01-10 | DRG: 062 | Disposition: A | Discharge status: Rehabilitation Facility | Payer: Medicare Other | Attending: Neurology | Admitting: Neurology

## 2019-12-30 ENCOUNTER — Encounter: Payer: Self-pay | Admitting: Gastroenterology

## 2019-12-30 ENCOUNTER — Ambulatory Visit
Admission: RE | Admit: 2019-12-30 | Discharge: 2019-12-30 | Payer: Medicare Other | Attending: Gastroenterology | Admitting: Gastroenterology

## 2019-12-30 ENCOUNTER — Emergency Department (HOSPITAL_COMMUNITY): Payer: Medicare Other

## 2019-12-30 ENCOUNTER — Telehealth: Payer: Self-pay

## 2019-12-30 ENCOUNTER — Ambulatory Visit: Payer: Medicare Other

## 2019-12-30 ENCOUNTER — Emergency Department
Admission: EM | Admit: 2019-12-30 | Discharge: 2019-12-30 | Disposition: A | Discharge status: Home / Self Care | Payer: Medicare Other | Source: Home / Self Care | Attending: Emergency Medicine | Admitting: Emergency Medicine

## 2019-12-30 ENCOUNTER — Encounter
Admission: RE | Disposition: A | Discharge status: Other Acute Inpatient Hospital | Payer: Self-pay | Source: Home / Self Care | Attending: Gastroenterology

## 2019-12-30 DIAGNOSIS — N179 Acute kidney failure, unspecified: Secondary | ICD-10-CM | POA: Diagnosis present

## 2019-12-30 DIAGNOSIS — E1122 Type 2 diabetes mellitus with diabetic chronic kidney disease: Secondary | ICD-10-CM | POA: Insufficient documentation

## 2019-12-30 DIAGNOSIS — R0989 Other specified symptoms and signs involving the circulatory and respiratory systems: Secondary | ICD-10-CM

## 2019-12-30 DIAGNOSIS — Z8249 Family history of ischemic heart disease and other diseases of the circulatory system: Secondary | ICD-10-CM | POA: Insufficient documentation

## 2019-12-30 DIAGNOSIS — I6523 Occlusion and stenosis of bilateral carotid arteries: Secondary | ICD-10-CM | POA: Diagnosis not present

## 2019-12-30 DIAGNOSIS — D500 Iron deficiency anemia secondary to blood loss (chronic): Secondary | ICD-10-CM

## 2019-12-30 DIAGNOSIS — Z7902 Long term (current) use of antithrombotics/antiplatelets: Secondary | ICD-10-CM

## 2019-12-30 DIAGNOSIS — Z87891 Personal history of nicotine dependence: Secondary | ICD-10-CM | POA: Insufficient documentation

## 2019-12-30 DIAGNOSIS — R079 Chest pain, unspecified: Secondary | ICD-10-CM | POA: Insufficient documentation

## 2019-12-30 DIAGNOSIS — Z955 Presence of coronary angioplasty implant and graft: Secondary | ICD-10-CM | POA: Insufficient documentation

## 2019-12-30 DIAGNOSIS — I6601 Occlusion and stenosis of right middle cerebral artery: Secondary | ICD-10-CM | POA: Diagnosis not present

## 2019-12-30 DIAGNOSIS — D649 Anemia, unspecified: Secondary | ICD-10-CM

## 2019-12-30 DIAGNOSIS — I6503 Occlusion and stenosis of bilateral vertebral arteries: Secondary | ICD-10-CM | POA: Diagnosis not present

## 2019-12-30 DIAGNOSIS — Z833 Family history of diabetes mellitus: Secondary | ICD-10-CM | POA: Insufficient documentation

## 2019-12-30 DIAGNOSIS — I248 Other forms of acute ischemic heart disease: Secondary | ICD-10-CM | POA: Diagnosis present

## 2019-12-30 DIAGNOSIS — Z79899 Other long term (current) drug therapy: Secondary | ICD-10-CM | POA: Insufficient documentation

## 2019-12-30 DIAGNOSIS — I952 Hypotension due to drugs: Secondary | ICD-10-CM

## 2019-12-30 DIAGNOSIS — Z880 Allergy status to penicillin: Secondary | ICD-10-CM | POA: Insufficient documentation

## 2019-12-30 DIAGNOSIS — Z7982 Long term (current) use of aspirin: Secondary | ICD-10-CM

## 2019-12-30 DIAGNOSIS — I639 Cerebral infarction, unspecified: Secondary | ICD-10-CM | POA: Diagnosis present

## 2019-12-30 DIAGNOSIS — Z794 Long term (current) use of insulin: Secondary | ICD-10-CM

## 2019-12-30 DIAGNOSIS — E1165 Type 2 diabetes mellitus with hyperglycemia: Secondary | ICD-10-CM | POA: Diagnosis present

## 2019-12-30 DIAGNOSIS — F0390 Unspecified dementia without behavioral disturbance: Secondary | ICD-10-CM | POA: Insufficient documentation

## 2019-12-30 DIAGNOSIS — Z7189 Other specified counseling: Secondary | ICD-10-CM

## 2019-12-30 DIAGNOSIS — R059 Cough, unspecified: Secondary | ICD-10-CM | POA: Diagnosis not present

## 2019-12-30 DIAGNOSIS — R778 Other specified abnormalities of plasma proteins: Secondary | ICD-10-CM | POA: Diagnosis present

## 2019-12-30 DIAGNOSIS — Z538 Procedure and treatment not carried out for other reasons: Secondary | ICD-10-CM | POA: Insufficient documentation

## 2019-12-30 DIAGNOSIS — I129 Hypertensive chronic kidney disease with stage 1 through stage 4 chronic kidney disease, or unspecified chronic kidney disease: Secondary | ICD-10-CM | POA: Insufficient documentation

## 2019-12-30 DIAGNOSIS — Z7983 Long term (current) use of bisphosphonates: Secondary | ICD-10-CM

## 2019-12-30 DIAGNOSIS — I959 Hypotension, unspecified: Secondary | ICD-10-CM | POA: Diagnosis not present

## 2019-12-30 DIAGNOSIS — R404 Transient alteration of awareness: Secondary | ICD-10-CM | POA: Diagnosis not present

## 2019-12-30 DIAGNOSIS — E1152 Type 2 diabetes mellitus with diabetic peripheral angiopathy with gangrene: Secondary | ICD-10-CM | POA: Diagnosis present

## 2019-12-30 DIAGNOSIS — R4781 Slurred speech: Secondary | ICD-10-CM | POA: Diagnosis present

## 2019-12-30 DIAGNOSIS — I10 Essential (primary) hypertension: Secondary | ICD-10-CM | POA: Diagnosis not present

## 2019-12-30 DIAGNOSIS — Z7901 Long term (current) use of anticoagulants: Secondary | ICD-10-CM | POA: Insufficient documentation

## 2019-12-30 DIAGNOSIS — H748X1 Other specified disorders of right middle ear and mastoid: Secondary | ICD-10-CM | POA: Diagnosis not present

## 2019-12-30 DIAGNOSIS — M549 Dorsalgia, unspecified: Secondary | ICD-10-CM | POA: Diagnosis present

## 2019-12-30 DIAGNOSIS — R414 Neurologic neglect syndrome: Secondary | ICD-10-CM | POA: Diagnosis present

## 2019-12-30 DIAGNOSIS — G8194 Hemiplegia, unspecified affecting left nondominant side: Secondary | ICD-10-CM | POA: Diagnosis not present

## 2019-12-30 DIAGNOSIS — G894 Chronic pain syndrome: Secondary | ICD-10-CM | POA: Diagnosis present

## 2019-12-30 DIAGNOSIS — M25561 Pain in right knee: Secondary | ICD-10-CM | POA: Diagnosis not present

## 2019-12-30 DIAGNOSIS — I5032 Chronic diastolic (congestive) heart failure: Secondary | ICD-10-CM | POA: Diagnosis present

## 2019-12-30 DIAGNOSIS — E11649 Type 2 diabetes mellitus with hypoglycemia without coma: Secondary | ICD-10-CM | POA: Diagnosis not present

## 2019-12-30 DIAGNOSIS — R001 Bradycardia, unspecified: Secondary | ICD-10-CM | POA: Diagnosis present

## 2019-12-30 DIAGNOSIS — R29706 NIHSS score 6: Secondary | ICD-10-CM | POA: Diagnosis present

## 2019-12-30 DIAGNOSIS — Z20822 Contact with and (suspected) exposure to covid-19: Secondary | ICD-10-CM | POA: Diagnosis present

## 2019-12-30 DIAGNOSIS — R29818 Other symptoms and signs involving the nervous system: Secondary | ICD-10-CM | POA: Diagnosis not present

## 2019-12-30 DIAGNOSIS — Z8546 Personal history of malignant neoplasm of prostate: Secondary | ICD-10-CM | POA: Insufficient documentation

## 2019-12-30 DIAGNOSIS — N1832 Chronic kidney disease, stage 3b: Secondary | ICD-10-CM | POA: Insufficient documentation

## 2019-12-30 DIAGNOSIS — I160 Hypertensive urgency: Secondary | ICD-10-CM | POA: Diagnosis present

## 2019-12-30 DIAGNOSIS — Z66 Do not resuscitate: Secondary | ICD-10-CM | POA: Diagnosis present

## 2019-12-30 DIAGNOSIS — R52 Pain, unspecified: Secondary | ICD-10-CM | POA: Diagnosis not present

## 2019-12-30 DIAGNOSIS — I252 Old myocardial infarction: Secondary | ICD-10-CM

## 2019-12-30 DIAGNOSIS — E1142 Type 2 diabetes mellitus with diabetic polyneuropathy: Secondary | ICD-10-CM | POA: Diagnosis present

## 2019-12-30 DIAGNOSIS — I70263 Atherosclerosis of native arteries of extremities with gangrene, bilateral legs: Secondary | ICD-10-CM | POA: Diagnosis present

## 2019-12-30 DIAGNOSIS — E1129 Type 2 diabetes mellitus with other diabetic kidney complication: Secondary | ICD-10-CM | POA: Diagnosis present

## 2019-12-30 DIAGNOSIS — I25110 Atherosclerotic heart disease of native coronary artery with unstable angina pectoris: Secondary | ICD-10-CM | POA: Insufficient documentation

## 2019-12-30 DIAGNOSIS — I130 Hypertensive heart and chronic kidney disease with heart failure and stage 1 through stage 4 chronic kidney disease, or unspecified chronic kidney disease: Secondary | ICD-10-CM | POA: Diagnosis present

## 2019-12-30 DIAGNOSIS — N1831 Chronic kidney disease, stage 3a: Secondary | ICD-10-CM | POA: Diagnosis present

## 2019-12-30 DIAGNOSIS — I2510 Atherosclerotic heart disease of native coronary artery without angina pectoris: Secondary | ICD-10-CM | POA: Diagnosis present

## 2019-12-30 DIAGNOSIS — I63511 Cerebral infarction due to unspecified occlusion or stenosis of right middle cerebral artery: Principal | ICD-10-CM | POA: Diagnosis present

## 2019-12-30 DIAGNOSIS — R1310 Dysphagia, unspecified: Secondary | ICD-10-CM | POA: Diagnosis present

## 2019-12-30 DIAGNOSIS — K5909 Other constipation: Secondary | ICD-10-CM | POA: Diagnosis present

## 2019-12-30 DIAGNOSIS — D631 Anemia in chronic kidney disease: Secondary | ICD-10-CM | POA: Diagnosis present

## 2019-12-30 DIAGNOSIS — R7989 Other specified abnormal findings of blood chemistry: Secondary | ICD-10-CM | POA: Diagnosis present

## 2019-12-30 DIAGNOSIS — Z89421 Acquired absence of other right toe(s): Secondary | ICD-10-CM

## 2019-12-30 DIAGNOSIS — R030 Elevated blood-pressure reading, without diagnosis of hypertension: Secondary | ICD-10-CM | POA: Diagnosis not present

## 2019-12-30 DIAGNOSIS — R471 Dysarthria and anarthria: Secondary | ICD-10-CM | POA: Diagnosis present

## 2019-12-30 DIAGNOSIS — R0789 Other chest pain: Secondary | ICD-10-CM | POA: Diagnosis not present

## 2019-12-30 DIAGNOSIS — R29810 Facial weakness: Secondary | ICD-10-CM | POA: Diagnosis not present

## 2019-12-30 DIAGNOSIS — G629 Polyneuropathy, unspecified: Secondary | ICD-10-CM

## 2019-12-30 DIAGNOSIS — E785 Hyperlipidemia, unspecified: Secondary | ICD-10-CM | POA: Diagnosis present

## 2019-12-30 DIAGNOSIS — I672 Cerebral atherosclerosis: Secondary | ICD-10-CM | POA: Diagnosis present

## 2019-12-30 DIAGNOSIS — I739 Peripheral vascular disease, unspecified: Secondary | ICD-10-CM | POA: Diagnosis present

## 2019-12-30 DIAGNOSIS — I517 Cardiomegaly: Secondary | ICD-10-CM | POA: Diagnosis not present

## 2019-12-30 DIAGNOSIS — I96 Gangrene, not elsewhere classified: Secondary | ICD-10-CM | POA: Diagnosis present

## 2019-12-30 DIAGNOSIS — R197 Diarrhea, unspecified: Secondary | ICD-10-CM | POA: Diagnosis present

## 2019-12-30 DIAGNOSIS — K219 Gastro-esophageal reflux disease without esophagitis: Secondary | ICD-10-CM | POA: Diagnosis present

## 2019-12-30 DIAGNOSIS — E11621 Type 2 diabetes mellitus with foot ulcer: Secondary | ICD-10-CM | POA: Diagnosis present

## 2019-12-30 DIAGNOSIS — L97519 Non-pressure chronic ulcer of other part of right foot with unspecified severity: Secondary | ICD-10-CM | POA: Diagnosis present

## 2019-12-30 DIAGNOSIS — L8990 Pressure ulcer of unspecified site, unspecified stage: Secondary | ICD-10-CM | POA: Insufficient documentation

## 2019-12-30 LAB — CBC
HCT: 35.8 % — ABNORMAL LOW (ref 39.0–52.0)
HCT: 27.9 % — ABNORMAL LOW (ref 39.0–52.0)
Hemoglobin: 11.2 g/dL — ABNORMAL LOW (ref 13.0–17.0)
Hemoglobin: 8.7 g/dL — ABNORMAL LOW (ref 13.0–17.0)
MCH: 26 pg (ref 26.0–34.0)
MCH: 26.6 pg (ref 26.0–34.0)
MCHC: 31.3 g/dL (ref 30.0–36.0)
MCHC: 31.2 g/dL (ref 30.0–36.0)
MCV: 83.3 fL (ref 80.0–100.0)
MCV: 85.3 fL (ref 80.0–100.0)
Platelets: 308 10*3/uL (ref 150–400)
Platelets: 235 10*3/uL (ref 150–400)
RBC: 4.3 MIL/uL (ref 4.22–5.81)
RBC: 3.27 MIL/uL — ABNORMAL LOW (ref 4.22–5.81)
RDW: 17.8 % — ABNORMAL HIGH (ref 11.5–15.5)
RDW: 17.6 % — ABNORMAL HIGH (ref 11.5–15.5)
WBC: 7.8 10*3/uL (ref 4.0–10.5)
WBC: 7 10*3/uL (ref 4.0–10.5)
nRBC: 0 % (ref 0.0–0.2)
nRBC: 0 % (ref 0.0–0.2)

## 2019-12-30 LAB — BASIC METABOLIC PANEL
Anion gap: 14 (ref 5–15)
BUN: 18 mg/dL (ref 8–23)
CO2: 21 mmol/L — ABNORMAL LOW (ref 22–32)
Calcium: 9.1 mg/dL (ref 8.9–10.3)
Chloride: 108 mmol/L (ref 98–111)
Creatinine, Ser: 1.24 mg/dL (ref 0.61–1.24)
GFR, Estimated: 56 mL/min — ABNORMAL LOW (ref >60)
Glucose, Bld: 130 mg/dL — ABNORMAL HIGH (ref 70–99)
Potassium: 3.8 mmol/L (ref 3.5–5.1)
Sodium: 143 mmol/L (ref 135–145)

## 2019-12-30 LAB — TROPONIN I (HIGH SENSITIVITY)
Troponin I (High Sensitivity): 35 ng/L — ABNORMAL HIGH (ref <18)
Troponin I (High Sensitivity): 50 ng/L — ABNORMAL HIGH (ref <18)

## 2019-12-30 LAB — DIFFERENTIAL
Abs Immature Granulocytes: 0.02 10*3/uL (ref 0.00–0.07)
Basophils Absolute: 0 10*3/uL (ref 0.0–0.1)
Basophils Relative: 1 %
Eosinophils Absolute: 0.1 10*3/uL (ref 0.0–0.5)
Eosinophils Relative: 1 %
Immature Granulocytes: 0 %
Lymphocytes Relative: 19 %
Lymphs Abs: 1.3 10*3/uL (ref 0.7–4.0)
Monocytes Absolute: 0.6 10*3/uL (ref 0.1–1.0)
Monocytes Relative: 9 %
Neutro Abs: 4.9 10*3/uL (ref 1.7–7.7)
Neutrophils Relative %: 70 %

## 2019-12-30 LAB — CBG MONITORING, ED: Glucose-Capillary: 176 mg/dL — ABNORMAL HIGH (ref 70–99)

## 2019-12-30 LAB — I-STAT CHEM 8, ED
BUN: 18 mg/dL (ref 8–23)
Calcium, Ion: 1.13 mmol/L — ABNORMAL LOW (ref 1.15–1.40)
Chloride: 103 mmol/L (ref 98–111)
Creatinine, Ser: 1.3 mg/dL — ABNORMAL HIGH (ref 0.61–1.24)
Glucose, Bld: 203 mg/dL — ABNORMAL HIGH (ref 70–99)
HCT: 28 % — ABNORMAL LOW (ref 39.0–52.0)
Hemoglobin: 9.5 g/dL — ABNORMAL LOW (ref 13.0–17.0)
Potassium: 3.6 mmol/L (ref 3.5–5.1)
Sodium: 141 mmol/L (ref 135–145)
TCO2: 22 mmol/L (ref 22–32)

## 2019-12-30 IMAGING — CR DG CHEST 2V
2 series · 2 of 2 positions shown · non-contrast
Comparison: [DATE].

CLINICAL DATA: Chest pain.

EXAM:
CHEST - 2 VIEW

[chest pa]
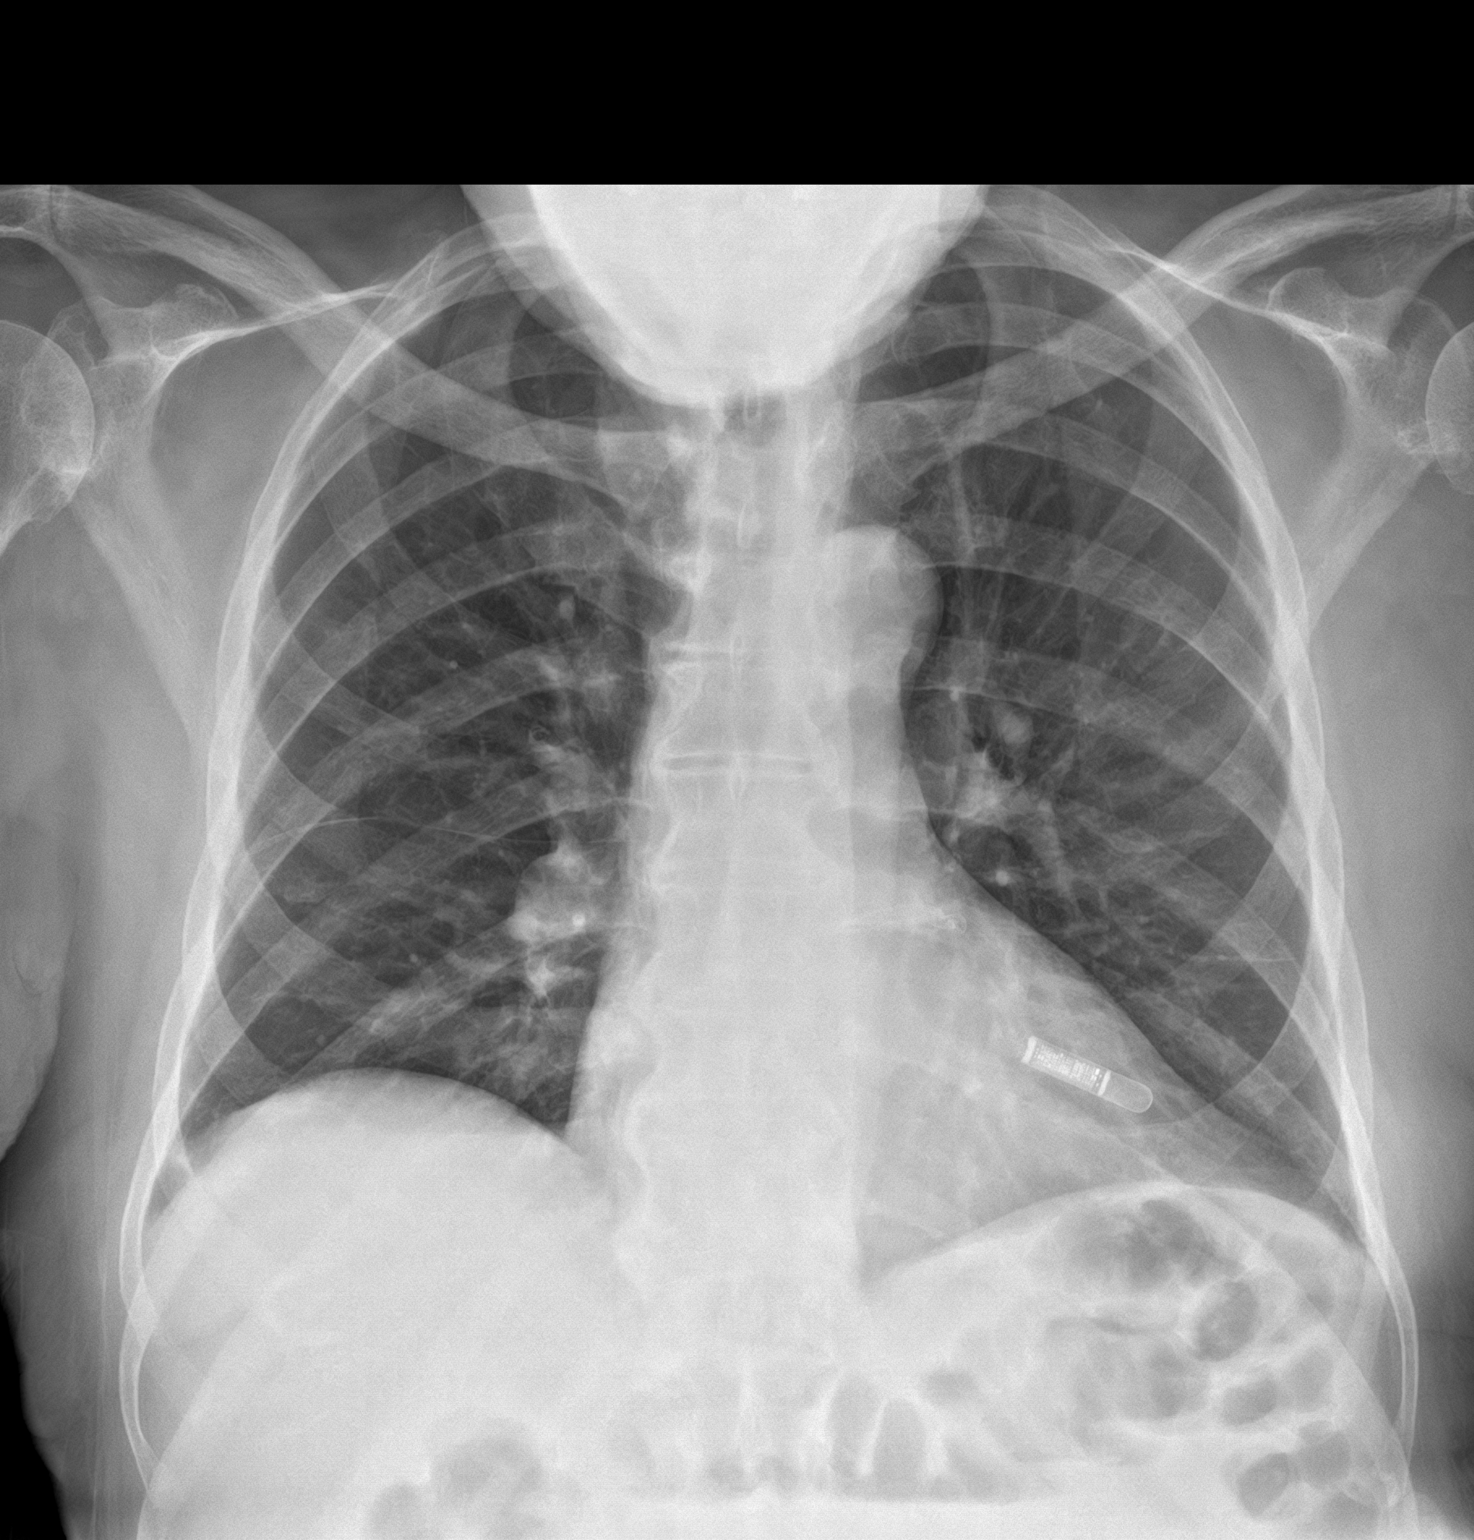

[chest lat]
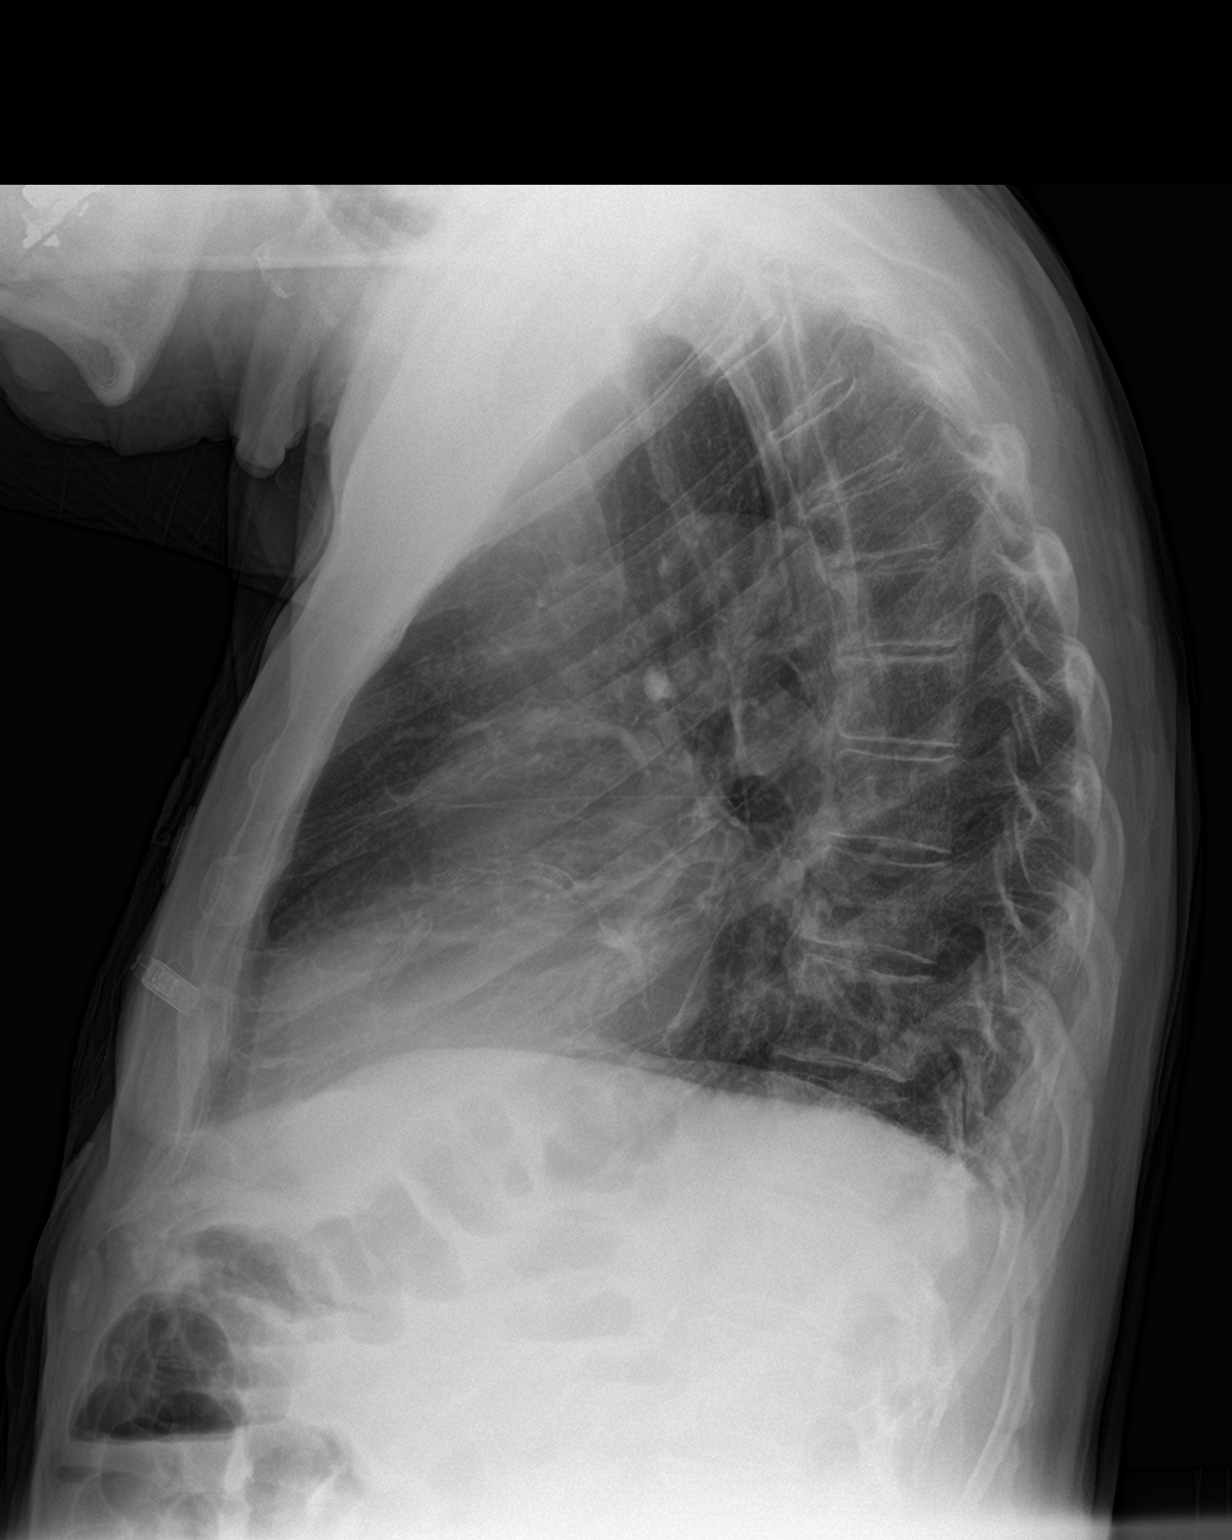

[2 of 2 positions shown; findings below may reference images not displayed]

FINDINGS: Cardiac monitor device noted in stable position. Stable
cardiomegaly. No pulmonary venous congestion. Low lung volumes. No
focal infiltrate. No pleural effusion or pneumothorax. Degenerative
change thoracic spine.
IMPRESSION: 1. Stable cardiomegaly. No pulmonary venous congestion.
2. Low lung volumes.

## 2019-12-30 IMAGING — CT CT ANGIO NECK
2 of 7 series · 8 of 33 positions shown · IV contrast (OMNI 350)
Comparison: Prior head CT from earlier same day.

CLINICAL DATA: Initial evaluation for acute stroke, left-sided
deficits.



[Series 5: cta neck · axial · 0.61mm/px · z∈[+1045,+1169]mm · 2 of 184 slices shown]
[im 62/184  soft-tissue]
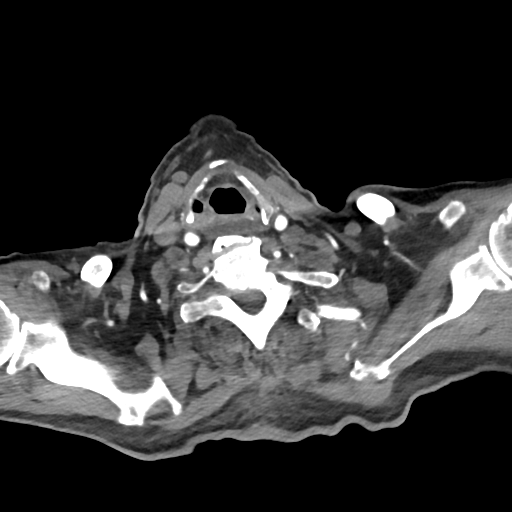
[im 123/184  soft-tissue]
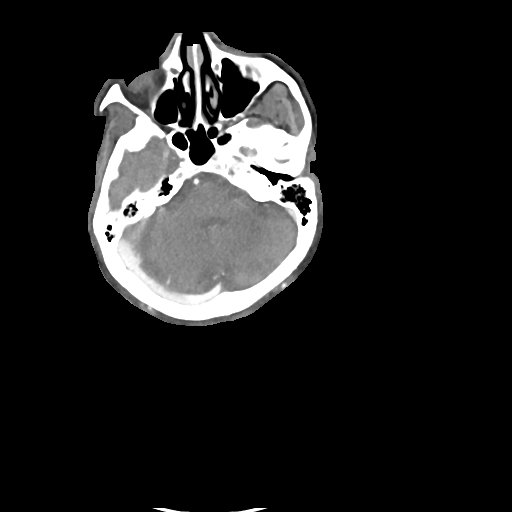

[Series 7: cta neck axial · axial · 0.47mm/px · z∈[+977,+1239]mm · 6 of 368 slices shown]
[im 53/368  soft-tissue]
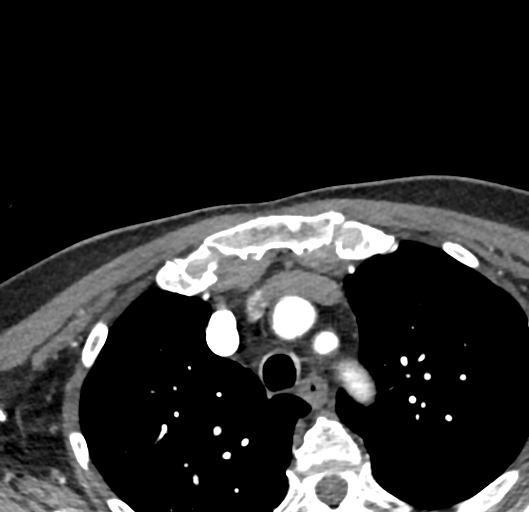
[im 105/368  bone]
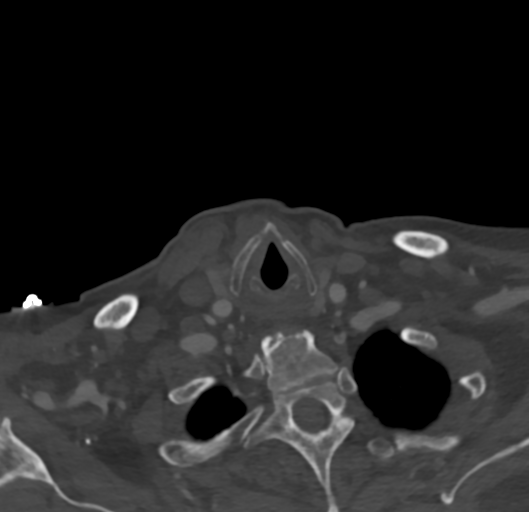
[im 158/368  soft-tissue]
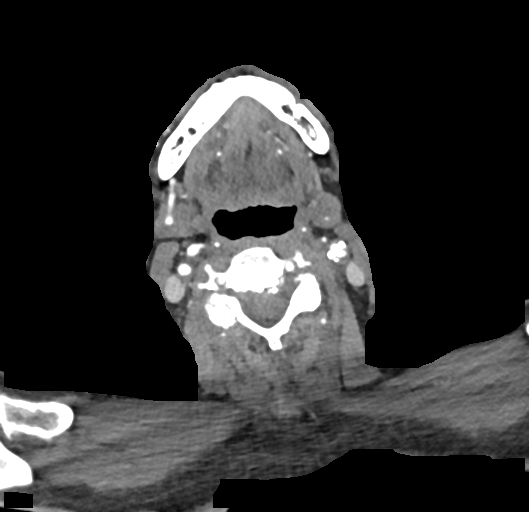
[im 210/368  bone]
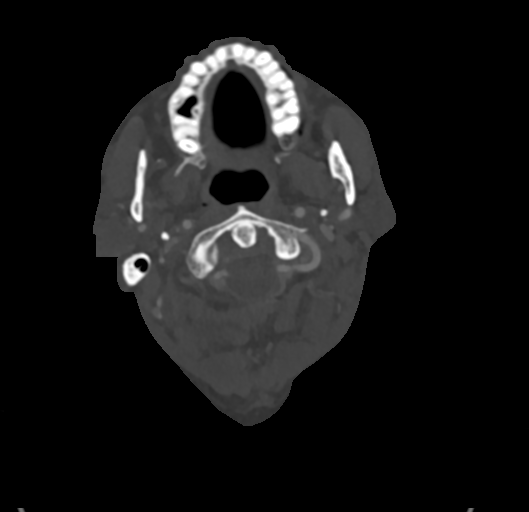
[im 263/368  soft-tissue]
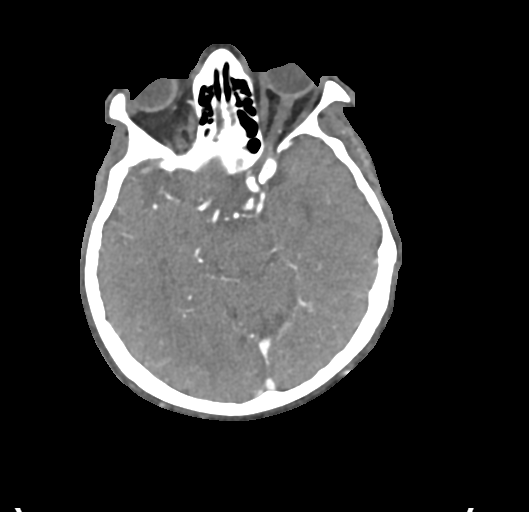
[im 315/368  bone]
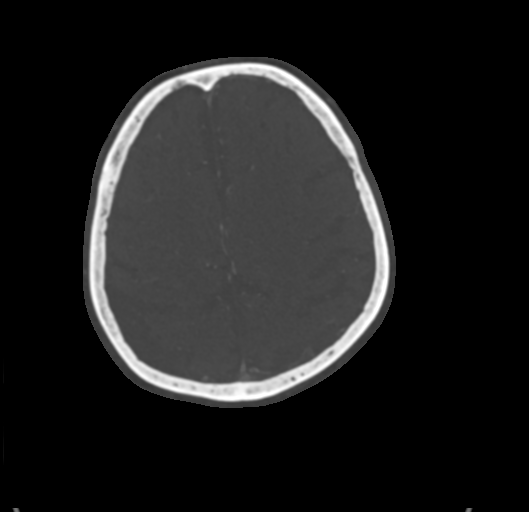

[8 of 33 positions shown; findings below may reference images not displayed]

FINDINGS: CTA NECK FINDINGS

Aortic arch: Visualized aortic arch normal caliber without aneurysm.
Bovine branching pattern with common origin of the right great
cephalic and left common carotid artery noted. Mild-to-moderate
atheromatous change noted within the proximal descending
intrathoracic aorta. No hemodynamically significant stenosis about
the origin the great vessels.

Right carotid system: Right CCA patent from its origin to the
bifurcation without stenosis. Scattered calcified plaque about the
right bifurcation/proximal right ICA with associated stenosis of up
to 50% by NASCET criteria. Right ICA patent distally to the skull
base without stenosis, dissection or occlusion.

Left carotid system: Left common carotid artery patent from its
origin to the bifurcation without significant stenosis. Multifocal
mixed plaque about the left bifurcation/proximal left ICA with
estimated 45% stenosis by NASCET criteria. Left ICA mildly irregular
but otherwise widely patent to the skull base without additional
stenosis, dissection or occlusion.

Vertebral arteries: Both vertebral arteries arise from the
subclavian arteries. No proximal subclavian artery stenosis.
Atheromatous change at the origins of the vertebral arteries with no
more than mild stenosis on the right. Vertebral arteries otherwise
patent without stenosis, dissection or occlusion.

Skeleton: No visible acute osseous abnormality. No discrete or
worrisome osseous lesions. Moderate multilevel cervical spondylosis.

Other neck: No other acute soft tissue abnormality within the neck.
No mass or adenopathy.

Upper chest: Small amount of secretions noted within the proximal
right mainstem bronchus, suggesting that this patient is at risk for
aspiration. Visualized upper chest demonstrates no other acute
finding.

Review of the MIP images confirms the above findings

CTA HEAD FINDINGS

Anterior circulation: Extensive atheromatous change throughout the
petrous, cavernous, and supraclinoid right ICA. Associated severe
stenosis at the anterior genu of the cavernous segment. Additional
scattered moderate multifocal narrowing elsewhere. On the left,
scattered atheromatous change throughout the left carotid siphon
with no more than mild to moderate multifocal narrowing. Left A1
segment widely patent. Right A1 hypoplastic with a severe proximal
stenosis (series 7, image 102). Normal anterior communicating artery
complex. Anterior cerebral arteries patent to their distal aspects
without stenosis.

Long segment severe near occlusive stenosis of the proximal right M1
segment measuring approximately 8-9 mm in length (series 7, image
104). Distal right MCA branches patent but attenuated distally.

Left M1 widely patent. Normal left MCA bifurcation. Distal left MCA
branches well perfused.

Posterior circulation: Atheromatous change about the V4 segments
bilaterally with associated moderate short-segment stenoses, right
worse than left. Left vertebral artery dominant. Both PICAs patent.
Basilar patent to its distal aspect without stenosis. Superior
cerebral arteries patent bilaterally. Right PCA supplied via the
basilar. 2 mm conical shaped focal outpouching arising at the
junction of the right P1 and P2 segment favored to reflect a
vascular infundibulum related to an occluded hypoplastic right PCOM.
Fetal type origin of the left PCA. Both PCAs remain well perfused to
their distal aspects.

Venous sinuses: Grossly patent allowing for timing the contrast
bolus.

Anatomic variants: Hypoplastic right A1 segment. Dominant left
vertebral artery. Fetal type origin of the left PCA.

Review of the MIP images confirms the above findings
IMPRESSION: 1. Negative CTA for emergent large vessel occlusion.
2. Approximate 8-9 mm severe near occlusive stenosis of the proximal
right M1 segment. Right MCA branches attenuated but patent distally
with no visible downstream occlusion.
3. Additional severe proximal right A1 stenosis.
4. Atheromatous change about the carotid bifurcations with
associated stenoses of up to 50% on the right and 45% on the left.
5. Atheromatous change about the right carotid siphon with
associated moderate to severe multifocal stenoses.
6. Small amount of secretions within the proximal right mainstem
bronchus, suggesting that this patient is at risk for aspiration.

Critical Value/emergent results were discussed by telephone at the
[facility] , who verbally acknowledged these results.

## 2019-12-30 IMAGING — CT CT HEAD CODE STROKE
3 series · 15 of 47 positions shown, 18 images · non-contrast
Comparison: None.

CLINICAL DATA: Code stroke. Initial evaluation for acute left-sided
weakness.

EXAM:
CT HEAD WITHOUT CONTRAST
TECHNIQUE: Contiguous axial images were obtained from the base of the skull
through the vertex without intravenous contrast.

[Series 3: head 5.0 st · axial · 0.39mm/px · z∈[+1153,+1293]mm · 9 of 34 slices shown, 12 images]
[im 3/34  brain]
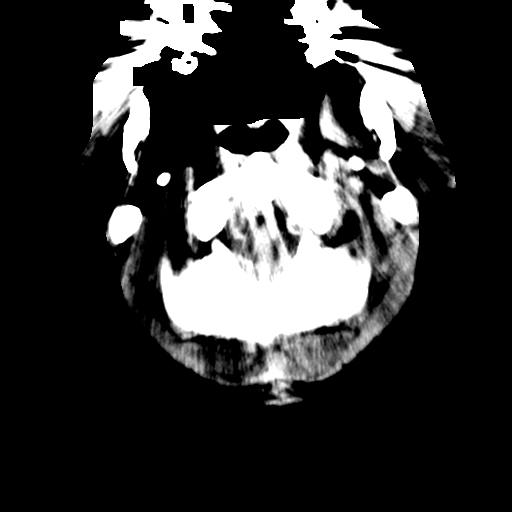
[im 3/34  bone]
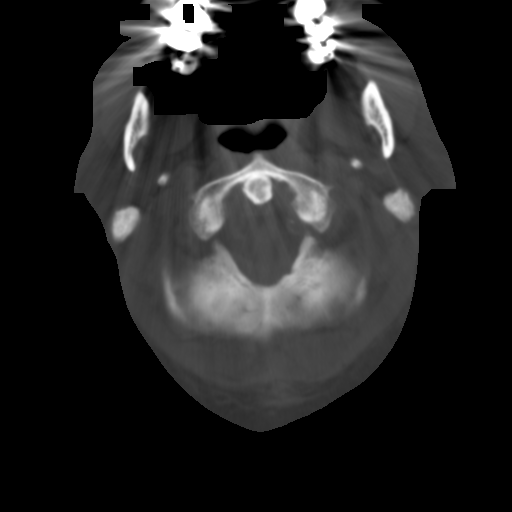
[im 6/34  brain]
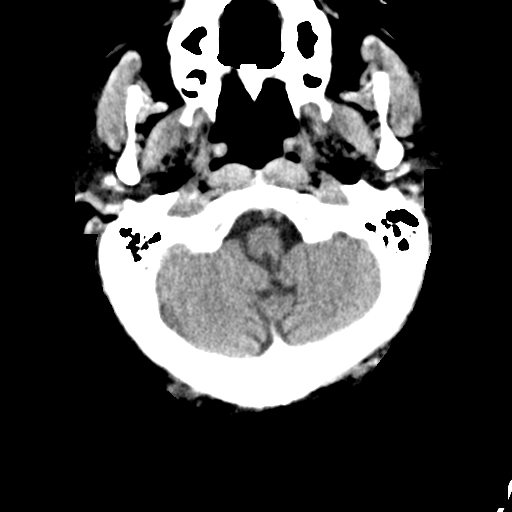
[im 10/34  brain]
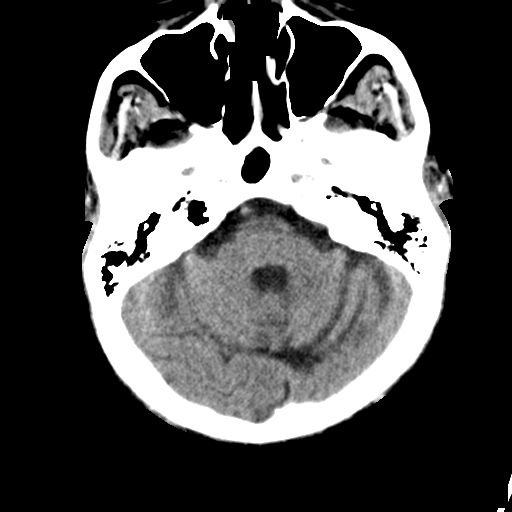
[im 13/34  brain]
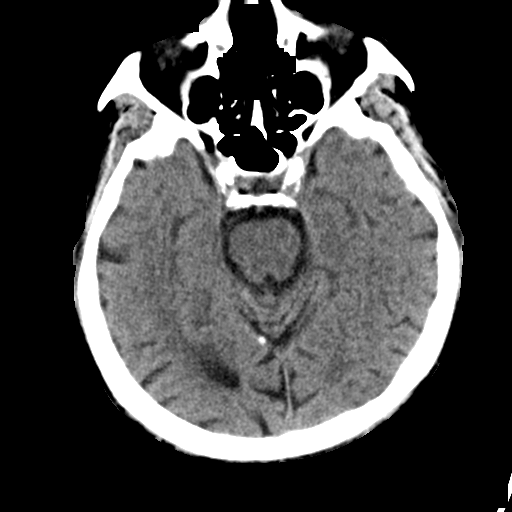
[im 18/34  brain]
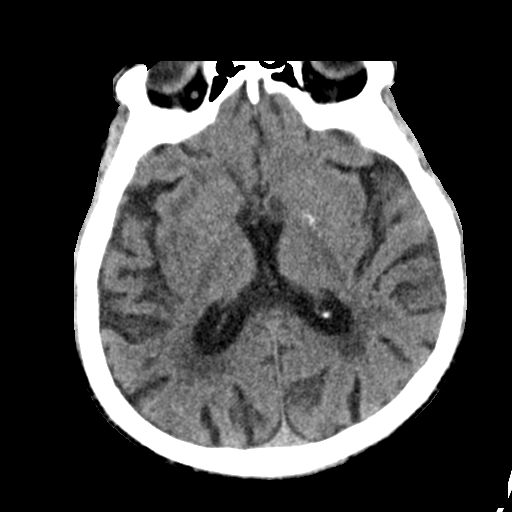
[im 18/34  bone]
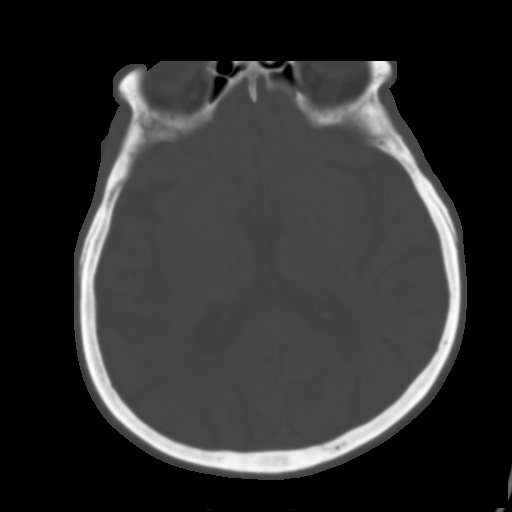
[im 21/34  brain]
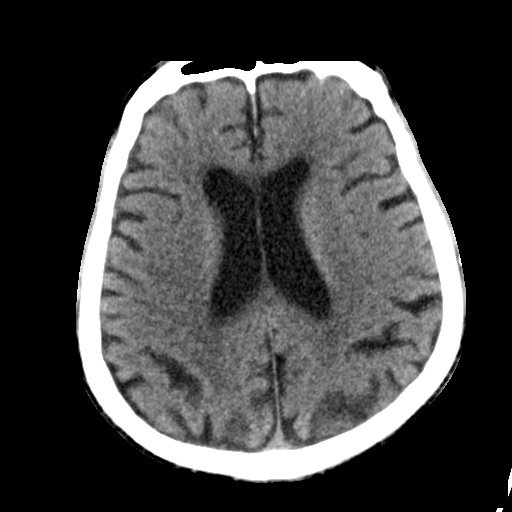
[im 24/34  brain]
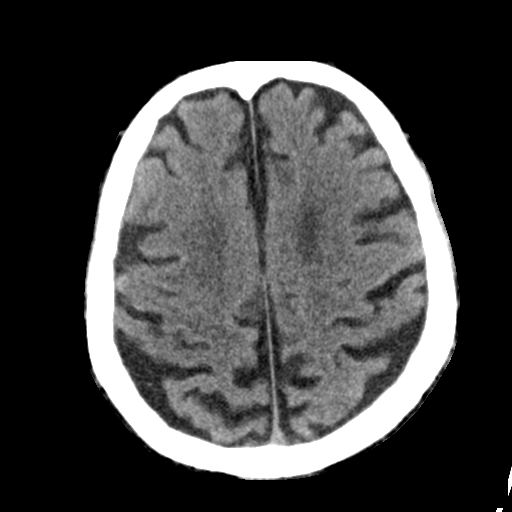
[im 28/34  brain]
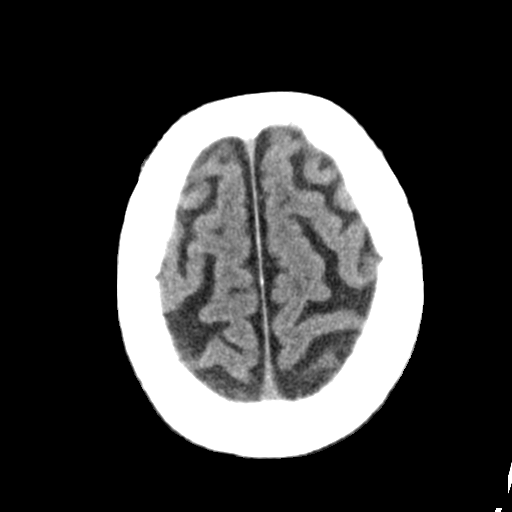
[im 31/34  brain]
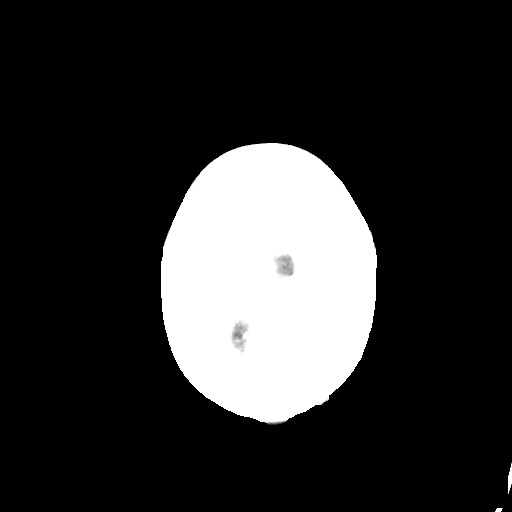
[im 31/34  bone]
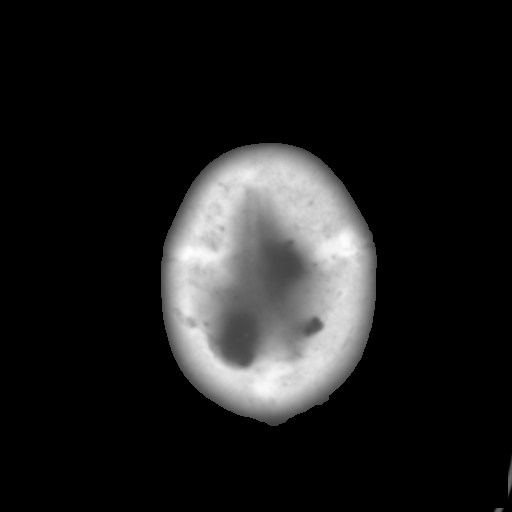

[Series 5: head 3.0 cor st · coronal · 0.30mm/px · 3 of 67 slices shown]
[im 25/67  brain]
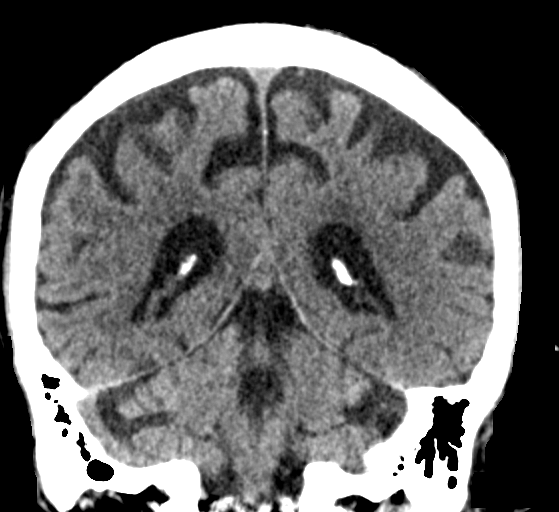
[im 31/67  brain]
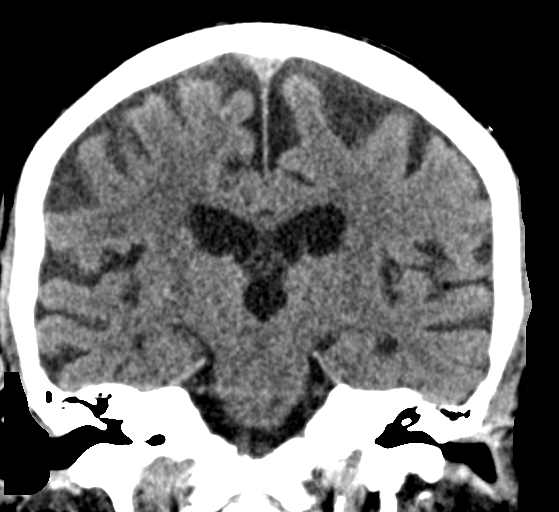
[im 36/67  brain]
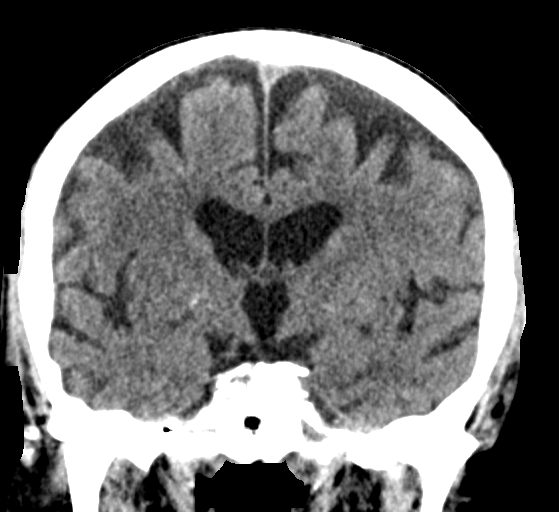

[Series 6: head 3.0 sag st · sagittal · 0.33mm/px · 3 of 55 slices shown]
[im 19/55  brain]
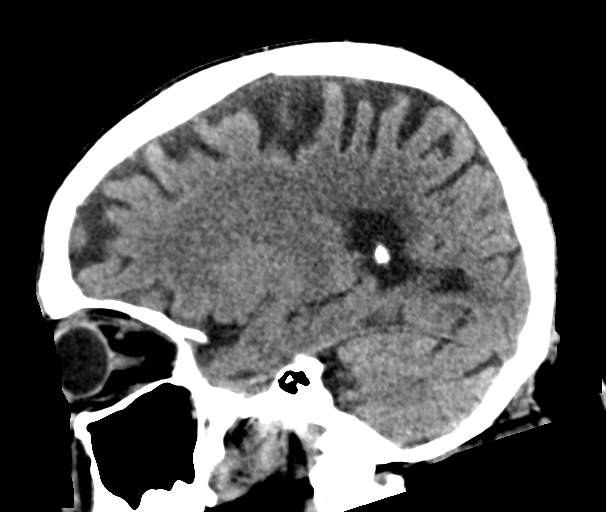
[im 28/55  brain]
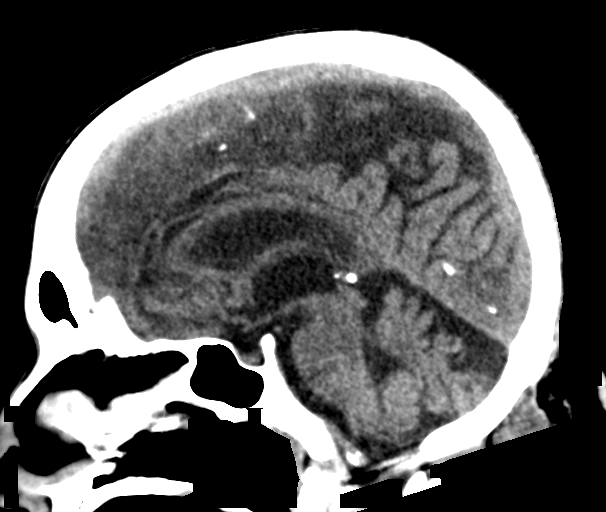
[im 37/55  brain]
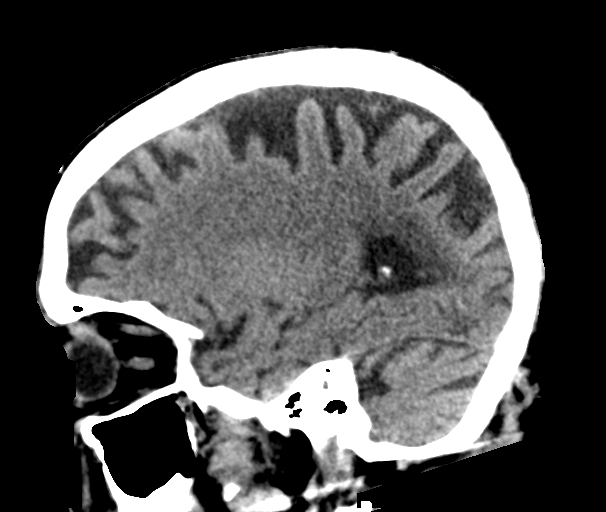

[15 of 47 positions shown; findings below may reference images not displayed]

FINDINGS: Brain: Age-related cerebral atrophy with mild chronic small vessel
ischemic disease. No acute intracranial hemorrhage. No definite
acute large vessel territory infarct. No mass lesion, mass effect,
or midline shift. No hydrocephalus or extra-axial fluid collection.

Vascular: No hyperdense vessel. Calcified atherosclerosis present at
skull base.

Skull: Scalp soft tissues within normal limits.  Calvarium intact.

Sinuses/Orbits: Globes and orbital soft tissues within normal
limits. Visualized paranasal sinuses are clear. Small chronic
appearing right mastoid effusion.

Other: None.

ASPECTS ([place] Stroke Program Early CT Score)

- Ganglionic level infarction (caudate, lentiform nuclei, internal
capsule, insula, M1-M3 cortex): 7

- Supraganglionic infarction (M4-M6 cortex): 3

Total score (0-10 with 10 being normal): 10
IMPRESSION: 1. No acute intracranial infarct or other abnormality.
2. ASPECTS is 10.
3. Age-appropriate cerebral atrophy with mild chronic small vessel
ischemic disease.

These results were communicated to Dr. TIGER at [DATE] pmon
[DATE]by text page via the [employer] messaging system.

## 2019-12-30 SURGERY — COLONOSCOPY WITH PROPOFOL
Anesthesia: General

## 2019-12-30 MED ORDER — CLONIDINE HCL 0.1 MG PO TABS
0.1000 mg | ORAL_TABLET | Freq: Once | ORAL | Status: AC
  Administered 2019-12-30: 0.1 mg via ORAL
  Filled 2019-12-30: qty 1

## 2019-12-30 MED ORDER — ISOSORBIDE MONONITRATE ER 60 MG PO TB24
60.0000 mg | ORAL_TABLET | Freq: Once | ORAL | Status: AC
  Administered 2019-12-30: 60 mg via ORAL
  Filled 2019-12-30: qty 1

## 2019-12-30 MED ORDER — SODIUM CHLORIDE 0.9 % IV SOLN: INTRAVENOUS | Status: DC

## 2019-12-30 MED ORDER — CARVEDILOL 6.25 MG PO TABS
3.1250 mg | ORAL_TABLET | Freq: Once | ORAL | Status: AC
  Administered 2019-12-30: 3.125 mg via ORAL
  Filled 2019-12-30: qty 1

## 2019-12-30 MED ORDER — DOXAZOSIN MESYLATE 4 MG PO TABS
4.0000 mg | ORAL_TABLET | Freq: Every day | ORAL | Status: DC
  Administered 2019-12-30: 4 mg via ORAL
  Filled 2019-12-30: qty 1

## 2019-12-30 MED ORDER — HYDRALAZINE HCL 50 MG PO TABS
50.0000 mg | ORAL_TABLET | Freq: Once | ORAL | Status: AC
  Administered 2019-12-30: 50 mg via ORAL
  Filled 2019-12-30: qty 1

## 2019-12-30 MED ORDER — IOHEXOL 350 MG/ML SOLN
80.0000 mL | Freq: Once | INTRAVENOUS | Status: AC | PRN
  Administered 2019-12-30: 80 mL via INTRAVENOUS

## 2019-12-30 MED ORDER — AMLODIPINE BESYLATE 5 MG PO TABS
10.0000 mg | ORAL_TABLET | Freq: Once | ORAL | Status: AC
  Administered 2019-12-30: 10 mg via ORAL
  Filled 2019-12-30: qty 2

## 2019-12-30 NOTE — Discharge Instructions (Signed)
Please follow-up with your primary care doctor regarding your high blood pressure today in the emergency department.  Please take your medications starting this evening as prescribed by your doctor.  Return to the emergency department for any chest pain, trouble breathing, or any other symptom personally concerning to yourself.

## 2019-12-30 NOTE — Progress Notes (Signed)
Pt with high BP. C/O chest tightness. Anesthesia MD wants to send to ED for evaluation. Pt's ride/caregiver updated with this information. Pt transported down to the ED via wheelchair.

## 2019-12-30 NOTE — ED Triage Notes (Signed)
Pt comes via POV with c/o CP and hypertension. Pt states the CP just started. Pt states mid upper CP. Pt states some tightness.  Pt denies any SOB, dizziness.  Pt has not taken BP meds today. Pt denies any blurred vision or weakness.

## 2019-12-30 NOTE — ED Triage Notes (Signed)
First RN Note: pt to ED via wheelchair from Endoscopy. Per Endoscopy RN pt's BP 225/78, unable to perform endoscopy due to pt's BP. Pt with hx of HTN, states did not take his BP medications due to procedure this morning.
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
## 2019-12-30 NOTE — ED Notes (Signed)
Patient transported to X-ray 

## 2019-12-30 NOTE — Anesthesia Preprocedure Evaluation (Deleted)
Anesthesia Evaluation    Airway        Dental   Pulmonary former smoker,           Cardiovascular hypertension, + CAD, + Past MI and + Peripheral Vascular Disease       Neuro/Psych PSYCHIATRIC DISORDERS Depression Dementia CVA    GI/Hepatic GERD  ,  Endo/Other  diabetes  Renal/GU Renal disease (CKD)     Musculoskeletal   Abdominal   Peds  Hematology  (+) Blood dyscrasia, anemia ,   Anesthesia Other Findings   Reproductive/Obstetrics                            Anesthesia Physical Anesthesia Plan  ASA: III  Anesthesia Plan:    Post-op Pain Management:    Induction:   PONV Risk Score and Plan:   Airway Management Planned:   Additional Equipment:   Intra-op Plan:   Post-operative Plan:   Informed Consent:   Plan Discussed with:   Anesthesia Plan Comments:         Anesthesia Quick Evaluation

## 2019-12-30 NOTE — ED Notes (Signed)
Rn Bubba Hales attempted IV. Able to get blood but unable to save IV. Will inform RN

## 2019-12-30 NOTE — Telephone Encounter (Signed)
1235 pm.  Return call made to [facility].  She advises that patient is currently in the ED due to an elevated BP.  Patient was scheduled for a colonoscopy today but this has been canceled due to the BP issues.  Vaughan Basta is asking to schedule our next visit with patient.  She is agreeable for December 1 @ 1 pm.

## 2019-12-30 NOTE — ED Provider Notes (Addendum)
Patient received in signout from Dr. Kerman Passey for evaluation of hypertension in the setting of patient missing his morning medications to have an endoscopy performed.  His work-up shows no evidence of ischemia or endorgan damage.  He was provided his home BP medications with improvement of blood pressure, and then overcorrection with a couple hypotensive episodes with maps in the mid 60s.  He had some mild disorientation and altered mentation coinciding with his blood pressures, he received IV fluids with resolution of the symptoms and soft pressures.  He was observed for another hour and maintained normotensive pressures and a sharp mental status.  No chest pain, neuro deficits or distress.  We discussed return precautions for the ED and maintaining his blood pressure regimen as an outpatient.  Patient medically stable for discharge to custody of caregiver.  Clinical Course as of Dec 29 1729  Thu Dec 30, 2019  1610 Called to the bedside because of "weird breathing" by patient's caregiver.  Patient noted to be sweaty, pale and mumbling.  Recheck of his blood pressure shows soft pressures with 90/60.  We will hang a liter of fluids, I am concerned that his blood pressure bottomed out, causing encephalopathy.   [DS]  8413 USIV placed by me due to multiple failed attempts from nurses. MAP of 66. Fluids starting.Caregiver at the bedside indicates that he seems a little sleepy, but otherwise seems okay.   [DS]  2440 [id number]. BP 145/90. Patient much sharper and looks improved.Caregiver at the bedside indicates that he looks normal.  I provide patient with a cup of water at his request, he is requesting to go home   [DS]  1730 Reassessed.  Continues to have sharp mentation.  BP 140s over 80s.  Caregiver requests discharge.  Patient request discharge.  We discussed return precautions for the ED.   [DS]    Clinical Course User Index [DS] Vladimir Crofts, MD   .1-3 Lead EKG Interpretation Performed by:  Vladimir Crofts, MD Authorized by: Vladimir Crofts, MD     Interpretation: normal     ECG rate:  62   ECG rate assessment: normal     Rhythm: sinus rhythm     Ectopy: none     Conduction: normal   Ultrasound ED Peripheral IV (Provider)  Date/Time: 12/30/2019 5:40 PM Performed by: Vladimir Crofts, MD Authorized by: Vladimir Crofts, MD   Procedure details:    Indications: hydration, hypotension, multiple failed IV attempts and poor IV access     Skin Prep: chlorhexidine gluconate     Location: right basilic v.   Angiocath:  20 G   Bedside Ultrasound Guided: Yes     Images: not archived     Patient tolerated procedure without complications: Yes     Dressing applied: Yes        Vladimir Crofts, MD 12/30/19 [place], [place], MD 12/30/19 1742

## 2019-12-30 NOTE — ED Provider Notes (Signed)
TIME SEEN: 11:24 PM  CHIEF COMPLAINT: Code stroke  HPI: Patient is an 86 year old male with history of CAD status post stents, hypertension, diabetes, CKD, peripheral arterial disease, diastolic dysfunction, mild dementia who presents to the emergency department with EMS as a code stroke. Per EMS, family reports patient had not been "feeling well" for the past 24 to 48 hours. They report he was last seen normal at 10 PM. EMS noted left sided weakness in the arm and leg, right-sided facial droop, slurred speech. Patient initially had blood pressures in the 80s/60s and a heart rate in the 40s. He improved with 300 mL of IV fluids and his blood pressure is now in the 160s/60s and heart rate in the 70s. He denies any chest pain or shortness of breath. He denies any headache. EMS reports that he has been holding his Plavix for the last 4 days as he is scheduled for colonoscopy. Patient's blood sugar was in the 200s with EMS.  On review of patient's records, it appears patient was just at [facility] for hypertension after holding his blood pressure medication due to having an endoscopy. Was given his blood pressure medications and then had a brief episode of hypotension and encephalopathy that improved after IV fluids.  ROS: Level 5 caveat secondary to acuity  PAST MEDICAL HISTORY/PAST SURGICAL HISTORY:  Past Medical History:  Diagnosis Date  . Allergy   . Anemia due to stage 3b chronic kidney disease ([place]) 10/25/2019  . Arthritis   . Carpal tunnel syndrome   . CKD (chronic kidney disease), stage III ([place])   . Coronary artery disease    a. 03/2018 PCI [facility]): RCA 95p, 38m (2.5x38 Renick DES); b. 06/2019 PCI: LM 40ost, LAD 85p, 60p/m (atherectomy w/ 3.0x48 Synergy DES p/m LAD), 75m, LCX 50d, OM2/3 50, RCA patent prox/mid stent, 50d, RPDA 50.  . Diabetes mellitus without complication ([place])   . Diastolic dysfunction    a. 06/2019 Echo: EF 60-65%, no rwma, mod LVH, Gr1 DD.  Nl RV fxn. Mildly dil LA. Mild-mod TR.  . Diverticulitis   . GERD (gastroesophageal reflux disease)   . Gout   . History of blood transfusion   . History of chicken pox   . Hyperlipidemia   . Hypertension   . Mild dementia ([place])   . Myocardial infarction ([place]) 03/2018  . Normocytic anemia    a. 06/2019 s/p 1u PRBCs.  [name] PAD (peripheral artery disease) ([place])    a. 11/2018 s/p R SFA, popliteal, and peroneal artery PTA.  . Prostate cancer [facility])    prostate  . Prostate cancer ([place])   . Syncope     MEDICATIONS:  Prior to Admission medications   Medication Sig Start Date End Date Taking? Authorizing Provider  alendronate (FOSAMAX) 70 MG tablet Take 70 mg by mouth every Saturday.  08/12/19   [provider]  allopurinol (ZYLOPRIM) 100 MG tablet TAKE 1 TABLET BY MOUTH EVERY DAY Patient taking differently: Take 100 mg by mouth daily.  10/19/19   Lesleigh Noe, MD  amLODipine (NORVASC) 10 MG tablet TAKE 1 TABLET BY MOUTH EVERY DAY Patient taking differently: Take 10 mg by mouth daily.  10/25/19   Lesleigh Noe, MD  Ascorbic Acid (VITAMIN C) 1000 MG tablet Take 1,000 mg by mouth once a week.     [provider]  aspirin EC 81 MG tablet Take 81 mg by mouth every evening.     [provider]  atorvastatin (LIPITOR) 80 MG tablet Take 80 mg by mouth daily.    [provider]  beta carotene 25000 UNIT capsule Take 25,000 Units by mouth once a week.     [provider]  carvedilol (COREG) 3.125 MG tablet carvedilol 3.125 mg tablet  TAKE 1 TABLET (3.125 MG TOTAL) BY MOUTH 2 (TWO) TIMES DAILY WITH A MEAL.    [provider]  clopidogrel (PLAVIX) 75 MG tablet TAKE 1 TABLET BY MOUTH EVERY DAY 12/07/19   End, Harrell Gave, MD  donepezil (ARICEPT) 10 MG tablet Take 10 mg by mouth daily.     [provider]  doxazosin (CARDURA) 4 MG tablet Take 4 mg by mouth daily.    [provider]  doxycycline (VIBRA-TABS) 100 MG tablet Take 1 tablet  (100 mg total) by mouth 2 (two) times daily. 12/09/19   Lesleigh Noe, MD  DULoxetine (CYMBALTA) 30 MG capsule Take 1 capsule (30 mg total) by mouth daily. 11/29/19   Lesleigh Noe, MD  ferrous sulfate 325 (65 FE) MG tablet TAKE 1 TABLET BY MOUTH EVERY DAY Patient taking differently: Take 325 mg by mouth daily.  09/13/19   Lesleigh Noe, MD  finasteride (PROSCAR) 5 MG tablet Take 1 tablet (5 mg total) by mouth daily. 08/02/19   Vaillancourt, Samantha, PA-C  Flaxseed, Linseed, (FLAX SEED OIL) 1000 MG CAPS Take 1,000 mg by mouth once a week.     [provider]  folic acid (FOLVITE) 409 MCG tablet Take 400 mcg by mouth daily as needed (unsure).     [provider]  gabapentin (NEURONTIN) 300 MG capsule Take 1 capsule (300 mg total) by mouth 2 (two) times daily. 12/28/18   Gillis Santa, MD  hydrALAZINE (APRESOLINE) 50 MG tablet TAKE 1 TABLET BY MOUTH EVERY 8 HOURS 12/27/19   End, Harrell Gave, MD  hydrochlorothiazide (HYDRODIURIL) 12.5 MG tablet TAKE 1 TABLET BY MOUTH EVERY DAY 12/29/19   Lesleigh Noe, MD  hyoscyamine (ANASPAZ) 0.125 MG TBDP disintergrating tablet hyoscyamine 0.125 mg disintegrating tablet  TAKE 3 TABLETS BY MOUTH 2 TIMES DAILY.    [provider]  Insulin Syringe-Needle U-100 (TRUEPLUS INSULIN SYRINGE) 31G X 5/16" 1 ML MISC Use daily as directed. Dispense needles as prescribed in the past. DX: E11.22 12/28/18   Lesleigh Noe, MD  isosorbide mononitrate (IMDUR) 60 MG 24 hr tablet Take 1 tablet (60 mg total) by mouth daily. 10/15/19   Loel Dubonnet, NP  LANTUS 100 UNIT/ML injection Inject 20 Units into the skin at bedtime.  11/05/19   [provider]  losartan (COZAAR) 100 MG tablet TAKE 1 TABLET BY MOUTH EVERY DAY Patient taking differently: Take 100 mg by mouth daily.  08/09/19   Lesleigh Noe, MD  memantine (NAMENDA) 10 MG tablet TAKE 1 TABLET BY MOUTH TWICE A DAY Patient taking differently: Take 10 mg by mouth in the morning and at  bedtime.  09/21/19   Lesleigh Noe, MD  nitroGLYCERIN (NITROSTAT) 0.4 MG SL tablet Place 1 tablet (0.4 mg total) under the tongue every 5 (five) minutes x 3 doses as needed for chest pain. Patient not taking: Reported on 12/13/2019 07/07/19   Cheryln Manly, NP  Omega-3 Fatty Acids (FISH OIL) 1000 MG CPDR Take 1,000 Units by mouth once a week.     [provider]  oxyCODONE-acetaminophen (PERCOCET/ROXICET) 5-325 MG tablet Take 1 tablet by mouth every 4 (four) hours as needed for severe pain. Patient not taking: Reported  on 12/13/2019    [provider]  selenium 50 MCG TABS tablet Take 50 mcg by mouth daily as needed (unknown).     [provider]  XTAMPZA ER 9 MG C12A Take 1 capsule by mouth 2 (two) times daily. 11/30/19   [provider]    ALLERGIES:  Allergies  Allergen Reactions  . Penicillins Anaphylaxis    Reported airway compromise 50 years    SOCIAL HISTORY:  Social History   Tobacco Use  . Smoking status: Former Smoker    Packs/day: 0.75    Years: 12.00    Pack years: 9.00    Types: Cigars, Cigarettes    Quit date: 1970    Years since quitting: 51.9  . Smokeless tobacco: Former Systems developer    Types: [place] date: 2016  Substance Use Topics  . Alcohol use: Yes    Comment: occasional    FAMILY HISTORY: Family History  Problem Relation Age of Onset  . Diabetes Mother   . Hypertension Mother   . Diabetes Father   . Dementia Father   . Healthy Daughter   . Healthy Daughter     EXAM: BP (!) 202/79 (BP Location: Right Arm)   Pulse 88   Resp 19   SpO2 98% 97.7 F oral CONSTITUTIONAL: Alert and oriented to person. Has a hard time answering questions appropriately. Chronically ill-appearing. Elderly. HEAD: Normocephalic, atraumatic EYES: Conjunctivae clear, pupils appear equal, EOM appear intact ENT: normal nose; moist mucous membranes NECK: Supple, normal ROM CARD: RRR; S1 and S2 appreciated; no murmurs, no clicks, no rubs,  no gallops RESP: Normal chest excursion without splinting or tachypnea; breath sounds clear and equal bilaterally; no wheezes, no rhonchi, no rales, no hypoxia or respiratory distress, speaking full sentences ABD/GI: Normal bowel sounds; non-distended; soft, non-tender, no rebound, no guarding, no peritoneal signs, no hepatosplenomegaly BACK:  The back appears normal EXT: Normal ROM in all joints; no deformity noted, no edema; no cyanosis SKIN: Normal color for age and race; warm; no rash on exposed skin NEURO: Appears weaker on the left side compared to the right but no drift. Right-sided facial droop. Slightly slurred speech. PSYCH: The patient's mood and manner are appropriate.   MEDICAL DECISION MAKING: CT head shows no acute abnormality.  CTA shows near occlusive stenosis of the proximal right M1 segment.  TPA will be initiated by Dr. Rory Percy, neurologist who has had discussion with family at bedside.  Patient is now hypertensive.  Cleviprex started.  Admitted to ICU.  ED PROGRESS: Labs show no acute abnormality.  MRI shows right MCA infarct.  BP improving on Cleviprex.  Patient receiving TPA.  Awaiting ICU bed.  Family at bedside.  I reviewed all nursing notes and pertinent previous records as available.  I have reviewed and interpreted any EKGs, lab and urine results, imaging (as available).     Date: 12/31/2019   Rate: 70  Rhythm: normal sinus rhythm  QRS Axis: normal  Intervals: normal  ST/T Wave abnormalities: Nonspecific inferolateral ST changes  Conduction Disutrbances: none  Narrative Interpretation: Nonspecific inferolateral ST changes, no change compared to previous EKG      CRITICAL CARE Performed by: Pryor Curia   Total critical care time: 45 minutes  Critical care time was exclusive of separately billable procedures and treating other patients.  Critical care was necessary to treat or prevent imminent or life-threatening deterioration.  Critical care was time  spent personally by me on the following activities: development  of treatment plan with patient and/or surrogate as well as nursing, discussions with consultants, evaluation of patient's response to treatment, examination of patient, obtaining history from patient or surrogate, ordering and performing treatments and interventions, ordering and review of laboratory studies, ordering and review of radiographic studies, pulse oximetry and re-evaluation of patient's condition.   Peter [name] was evaluated in Emergency Department on 12/30/2019 for the symptoms described in the history of present illness. He was evaluated in the context of the global COVID-19 pandemic, which necessitated consideration that the patient might be at risk for infection with the SARS-CoV-2 virus that causes COVID-19. Institutional protocols and algorithms that pertain to the evaluation of patients at risk for COVID-19 are in a state of rapid change based on information released by regulatory bodies including the CDC and federal and state organizations. These policies and algorithms were followed during the patient's care in the ED.      Natasha Burda, Delice Bison, DO 12/31/19 [id number]
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
## 2019-12-30 NOTE — ED Notes (Signed)
Pt with loose stool, pt states he had finished a bowel prep for a colonoscopy and started having chest tightness prior to the procedure.

## 2019-12-30 NOTE — H&P (Signed)
Neurology-stroke-H&P    CC: Left-sided weakness, slurred speech  History is obtained from: Patient, chart, daughter  HPI: Peter [name] is a 86 y.o. male past medical history of coronary artery disease, CKD stage III, dementia, diabetes, diastolic dysfunction, peripheral vascular disease, prostate cancer, history of multiple syncopal episodes, presented to the emergency room for evaluation of sudden onset of slurred speech, difficulty swallowing and left-sided weakness. The patient was scheduled for a GI scoping today, but could not go ahead with the procedure because of severely elevated blood pressures with systolic in the 536U.  He was seen at [facility] emergency room, treated with clonidine and discharged home.  He was at home with last known well at around 9 PM and the family noted that he had slurred speech-which was very different from his baseline speech and also had some left-sided weakness.  EMS was called and also noted some left-sided neglect on double simultaneous stimulation.  His blood pressures in route were systolics in the 44I and diastolics in the 34V with a heart rate fluctuating between 30-80. Code stroke was activated in the field.  Patient was brought in and evaluated at the ER bridge. Initial examination did reveal an NIH stroke scale of 6 with some left arm drift, left facial weakness, dysarthria as well as extinction on double simultaneous stimulation but the symptoms improved as the patient got his CT and CTA.  He had no drift in his left arm.  After the MRI his symptoms started to recur with left arm drift.  He had extinction initially which improved and then recurred. The CT head showed a near occlusive stenosis of the right M1 segment of the MCA.  He was sent in for a stat MRI because of symptoms that started to improve, and he was still within the window for IV TPA.  His modified Rankin score is a 4 at baseline and he would not be a candidate for  intervention. MRI was completed.  Shows acute to hyperacute right MCA territory infarct without consistent flair hyperintensity.  Initially decision made not to TPA due to rapidly improving symptoms.  But then due to fluctuating symptoms, I discussed in extensive detail, the risks and benefits of IV TPA with his daughter-before making a decision with her consent to give IV TPA. Other cause of delay for IV TPA versus blood pressures that required Cleviprex before he was in goal for TPA administration.  Patient denies any chest pain.  His troponins were mildly elevated at  regional this morning.  Family is concerned that there might be some sort of cardiac event going on with a stroke as well.  Family also notes that he has had stroke/TIA-like symptoms during last time when he was preparing for his colonoscopy and was dehydrated.  1 of those times he had slurred speech and left-sided weakness as well.  They cannot be sure that all the symptoms each time were left-sided only.  He is on Plavix at home-which he had discontinued for 5 days as he was preparing for the GI procedure.   LKW: 9:30 PM on 12/30/2019 tpa given?:  Yes Premorbid modified Rankin scale (mRS): 4  ROS: Performed and negative except as noted in HPI  Past Medical History:  Diagnosis Date  . Allergy   . Anemia due to stage 3b chronic kidney disease ([place]) 10/25/2019  . Arthritis   . Carpal tunnel syndrome   . CKD (chronic kidney disease), stage III ([place])   . Coronary artery disease  a. 03/2018 PCI [facility]): RCA 95p, 61m (2.5x38 [place] DES); b. 06/2019 PCI: LM 40ost, LAD 85p, 60p/m (atherectomy w/ 3.0x48 Synergy DES p/m LAD), 3m, LCX 50d, OM2/3 50, RCA patent prox/mid stent, 50d, RPDA 50.  . Diabetes mellitus without complication ([place])   . Diastolic dysfunction    a. 06/2019 Echo: EF 60-65%, no rwma, mod LVH, Gr1 DD. Nl RV fxn. Mildly dil LA. Mild-mod TR.  . Diverticulitis   . GERD (gastroesophageal  reflux disease)   . Gout   . History of blood transfusion   . History of chicken pox   . Hyperlipidemia   . Hypertension   . Mild dementia ([place])   . Myocardial infarction ([place]) 03/2018  . Normocytic anemia    a. 06/2019 s/p 1u PRBCs.  [name] PAD (peripheral artery disease) ([place])    a. 11/2018 s/p R SFA, popliteal, and peroneal artery PTA.  . Prostate cancer [facility])    prostate  . Prostate cancer ([place])   . Syncope     Family History  Problem Relation Age of Onset  . Diabetes Mother   . Hypertension Mother   . Diabetes Father   . Dementia Father   . Healthy Daughter   . Healthy Daughter    Social History:   reports that he quit smoking about 51 years ago. His smoking use included cigars and cigarettes. He has a 9.00 pack-year smoking history. He quit smokeless tobacco use about 5 years ago.  His smokeless tobacco use included chew. He reports current alcohol use. He reports that he does not use drugs.  Medications No current facility-administered medications for this encounter.  Current Outpatient Medications:  .  alendronate (FOSAMAX) 70 MG tablet, Take 70 mg by mouth every Saturday. , Disp: , Rfl:  .  allopurinol (ZYLOPRIM) 100 MG tablet, TAKE 1 TABLET BY MOUTH EVERY DAY (Patient taking differently: Take 100 mg by mouth daily. ), Disp: 90 tablet, Rfl: 1 .  amLODipine (NORVASC) 10 MG tablet, TAKE 1 TABLET BY MOUTH EVERY DAY (Patient taking differently: Take 10 mg by mouth daily. ), Disp: 90 tablet, Rfl: 1 .  Ascorbic Acid (VITAMIN C) 1000 MG tablet, Take 1,000 mg by mouth once a week. , Disp: , Rfl:  .  aspirin EC 81 MG tablet, Take 81 mg by mouth every evening. , Disp: , Rfl:  .  atorvastatin (LIPITOR) 80 MG tablet, Take 80 mg by mouth daily., Disp: , Rfl:  .  beta carotene 25000 UNIT capsule, Take 25,000 Units by mouth once a week. , Disp: , Rfl:  .  carvedilol (COREG) 3.125 MG tablet, carvedilol 3.125 mg tablet  TAKE 1 TABLET (3.125 MG TOTAL) BY MOUTH 2 (TWO) TIMES DAILY WITH A  MEAL., Disp: , Rfl:  .  clopidogrel (PLAVIX) 75 MG tablet, TAKE 1 TABLET BY MOUTH EVERY DAY, Disp: 90 tablet, Rfl: 0 .  donepezil (ARICEPT) 10 MG tablet, Take 10 mg by mouth daily. , Disp: , Rfl:  .  doxazosin (CARDURA) 4 MG tablet, Take 4 mg by mouth daily., Disp: , Rfl:  .  doxycycline (VIBRA-TABS) 100 MG tablet, Take 1 tablet (100 mg total) by mouth 2 (two) times daily., Disp: 20 tablet, Rfl: 0 .  DULoxetine (CYMBALTA) 30 MG capsule, Take 1 capsule (30 mg total) by mouth daily., Disp: 30 capsule, Rfl: 1 .  ferrous sulfate 325 (65 FE) MG tablet, TAKE 1 TABLET BY MOUTH EVERY DAY (Patient taking differently: Take 325 mg by mouth daily. ),  Disp: 90 tablet, Rfl: 3 .  finasteride (PROSCAR) 5 MG tablet, Take 1 tablet (5 mg total) by mouth daily., Disp: 30 tablet, Rfl: 6 .  Flaxseed, Linseed, (FLAX SEED OIL) 1000 MG CAPS, Take 1,000 mg by mouth once a week. , Disp: , Rfl:  .  folic acid (FOLVITE) 379 MCG tablet, Take 400 mcg by mouth daily as needed (unsure). , Disp: , Rfl:  .  gabapentin (NEURONTIN) 300 MG capsule, Take 1 capsule (300 mg total) by mouth 2 (two) times daily., Disp: 60 capsule, Rfl: 5 .  hydrALAZINE (APRESOLINE) 50 MG tablet, TAKE 1 TABLET BY MOUTH EVERY 8 HOURS, Disp: 90 tablet, Rfl: 1 .  hydrochlorothiazide (HYDRODIURIL) 12.5 MG tablet, TAKE 1 TABLET BY MOUTH EVERY DAY, Disp: 90 tablet, Rfl: 1 .  hyoscyamine (ANASPAZ) 0.125 MG TBDP disintergrating tablet, hyoscyamine 0.125 mg disintegrating tablet  TAKE 3 TABLETS BY MOUTH 2 TIMES DAILY., Disp: , Rfl:  .  Insulin Syringe-Needle U-100 (TRUEPLUS INSULIN SYRINGE) 31G X 5/16" 1 ML MISC, Use daily as directed. Dispense needles as prescribed in the past. DX: E11.22, Disp: 300 each, Rfl: 11 .  isosorbide mononitrate (IMDUR) 60 MG 24 hr tablet, Take 1 tablet (60 mg total) by mouth daily., Disp: 90 tablet, Rfl: 1 .  LANTUS 100 UNIT/ML injection, Inject 20 Units into the skin at bedtime. , Disp: , Rfl:  .  losartan (COZAAR) 100 MG tablet, TAKE 1  TABLET BY MOUTH EVERY DAY (Patient taking differently: Take 100 mg by mouth daily. ), Disp: 90 tablet, Rfl: 1 .  memantine (NAMENDA) 10 MG tablet, TAKE 1 TABLET BY MOUTH TWICE A DAY (Patient taking differently: Take 10 mg by mouth in the morning and at bedtime. ), Disp: 180 tablet, Rfl: 1 .  nitroGLYCERIN (NITROSTAT) 0.4 MG SL tablet, Place 1 tablet (0.4 mg total) under the tongue every 5 (five) minutes x 3 doses as needed for chest pain. (Patient not taking: Reported on 12/13/2019), Disp: 25 tablet, Rfl: 1 .  Omega-3 Fatty Acids (FISH OIL) 1000 MG CPDR, Take 1,000 Units by mouth once a week. , Disp: , Rfl:  .  oxyCODONE-acetaminophen (PERCOCET/ROXICET) 5-325 MG tablet, Take 1 tablet by mouth every 4 (four) hours as needed for severe pain. (Patient not taking: Reported on 12/13/2019), Disp: , Rfl:  .  selenium 50 MCG TABS tablet, Take 50 mcg by mouth daily as needed (unknown). , Disp: , Rfl:  .  XTAMPZA ER 9 MG C12A, Take 1 capsule by mouth 2 (two) times daily., Disp: , Rfl:   Exam: Current vital signs: BP (!) 143/56   Pulse 68   Temp 97.7 F (36.5 C) (Oral)   Resp 16   SpO2 98%  Vital signs in last 24 hours: Temp:  [97.6 F (36.4 C)-98 F (36.7 C)] 97.7 F (36.5 C) (11/19 0046) Pulse Rate:  [68-98] 68 (11/19 0100) Resp:  [12-28] 16 (11/19 0100) BP: (126-247)/(47-103) 143/56 (11/19 0100) SpO2:  [90 %-100 %] 98 % (11/19 0100) Weight:  [72.6 kg] 72.6 kg (11/18 1205) General: Awake alert in no distress HEENT: Normocephalic, atraumatic, dry mucous membranes, no thyromegaly Lungs: Clear to auscultation Cardiovascular: Regular rhythm Abdomen soft nondistended nontender Extremities warm well perfused Necrotic right foot Neurological exam Awake alert oriented x3 Speech is moderately dysarthric No evidence of aphasia Cranial nerves: Pupils equal round react light, extraocular movements intact, visual fields full, left face with mild flattening of nasolabial fold, facial sensation intact,  auditory acuity intact, tongue and palate midline. Motor  exam: Initially had mild vertical drift of the left upper extremity but not in the lower extremity which improved to no drift in both upper and lower extremity on the left and fluctuated up and down. Right upper and lower extremity were always full-strength. Sensory exam: Intact sensation to the left but on double simultaneous stimulation, neglects the left sensation. Coordination: No dysmetria Gait testing deferred NIH stroke scale initially was 6 then improved to 3 then back to 6.   Labs I have reviewed labs in epic and the results pertinent to this consultation are:  CBC    Component Value Date/Time   WBC 7.8 12/30/2019 1210   RBC 4.30 12/30/2019 1210   HGB 9.5 (L) 12/30/2019 2324   HCT 28.0 (L) 12/30/2019 2324   PLT 308 12/30/2019 1210   MCV 83.3 12/30/2019 1210   MCH 26.0 12/30/2019 1210   MCHC 31.3 12/30/2019 1210   RDW 17.8 (H) 12/30/2019 1210   LYMPHSABS 1.7 12/10/2019 1321   MONOABS 0.5 12/10/2019 1321   EOSABS 0.1 12/10/2019 1321   BASOSABS 0.0 12/10/2019 1321    CMP     Component Value Date/Time   NA 141 12/30/2019 2324   K 3.6 12/30/2019 2324   CL 103 12/30/2019 2324   CO2 23 12/30/2019 2315   GLUCOSE 203 (H) 12/30/2019 2324   BUN 18 12/30/2019 2324   CREATININE 1.30 (H) 12/30/2019 2324   CALCIUM 8.0 (L) 12/30/2019 2315   PROT 5.0 (L) 12/30/2019 2315   ALBUMIN 2.6 (L) 12/30/2019 2315   AST 22 12/30/2019 2315   ALT 24 12/30/2019 2315   ALKPHOS 70 12/30/2019 2315   BILITOT 0.3 12/30/2019 2315   GFRNONAA 46 (L) 12/30/2019 2315   GFRAA 52 (L) 11/09/2019 1030    Lipid Panel     Component Value Date/Time   CHOL 94 11/30/2019 0545   TRIG 97 11/30/2019 0545   HDL 37 (L) 11/30/2019 0545   CHOLHDL 2.5 11/30/2019 0545   VLDL 19 11/30/2019 0545   LDLCALC 38 11/30/2019 0545   Imaging I have reviewed the images obtained: CT-scan of the brain-no acute changes.  No bleed.  Aspects 10. CTA head and  neck with severe right M1 stenosis/subocclusive thrombus.  Severe proximal right A1 stenosis.  Atheromatous change about the carotid bifurcations with stenosis up to 50% on the right and 45% on the left.  Atheromatous changes about the right carotid siphon with associated moderate to severe multifocal stenosis.  Small amount of secretions within the right proximal mainstem bronchus suggesting risk of aspiration.  MRI examination of the brain-patchy diffusion abnormality involving the right insular cortex, subinsular white matter and overlying right frontal operculum consistent with an acute right MCA territory ischemia.  No associated hemorrhage or mass-effect.  Findings consistent with previously identified severe right M1 stenosis.  Assessment:  Mr. Rowlette is a 86 year old man who has an extensive cerebrovascular risk factor history presents to the emergency room for evaluation of sudden onset of slurred speech, difficulty swallowing and left-sided weakness. On examination he has left hemiparesis and left-sided neglect along with moderate dysarthria. His symptoms improved some on laying flat as he was getting the CT and CTA of the head. CT head did not show acute changes but the CT of the head revealed severe right M1 stenosis-likely the culprit vessel for his current presentation. MRI for the brain was done stat because of his rapidly improving symptoms and it revealed patchy diffusion abnormality in the right insular cortex, subinsular white matter  and overlying right frontal operculum consistent with a acute hyperacute right MCA territory ischemia because there was no FLAIR hyperintensity-likely secondary to the right M1 stenosis and hypoperfusion. His initial blood pressures on scene by EMS were 80/40 and in the emergency room went high as much as systolic of 299B. I suspect that the right insular stroke might be causing the autonomic instability and these wide blood pressure fluctuations along  with contribution from hypoperfusion due to dehydration-as he was preparing for his GI scopy for the past few days. He was also off of his Plavix during that time.  TPA given after detailed discussion with the family member-daughter Ms. [name] [name]. Discussed the risks and benefits-rapidly improving symptoms are considered relative contraindications but not absolute contraindications but given his extensive medical history, advanced age, risks especially of bleeding with TPA are higher.  After obtaining the MRI, and seen evolving strokes, and still being within the window, I had a detailed discussion about the benefits of TPA outweighing the risks at this time. Although there is an intracranial vessel that is severely stenosed and likely symptomatic, given his poor modified Rankin score, decided not to proceed with intervention.  Impression: Acute ischemic stroke-likely secondary to intracranial atherosclerosis and hypoperfusion Peripheral vascular disease Hypertension CKD 3 Coronary artery disease Diabetes Dementia Possible aspiration pneumonia   Recommendations: Admit to neurological ICU Frequent neurochecks per TPA protocol 24-hour CT head No antiplatelets or anticoagulants until 24 hours CT scan is negative for bleed. After the 24-hour CT scan is done and if it is negative for bleed, I would recommend dual antiplatelets and high intensity statin for 3 months PT OT speech therapy 2D echo A1c Lipid panel Gentle hydration with normal saline 75 cc an hour Anemia-hemoglobin initially reported 9.5 but final CBC revealed 8.7. Check H&H and transfuse if hemoglobin less than 7.0.  He has been anemic in the past but this morning his hemoglobin was 11.2. No complaints of chest pain but had elevated troponins at [place] regional 35-->50--now 215.-I strongly suspect that this is because of right insular involvement from the stroke but will trend and consult Cards/CCM. Check 12 lead EKG Will  talk to cardiology on call to discuss further. No acute respiratory issues but remains a high aspiration risk-keep head of the bed 30 degrees. Hyperglycemia-SSI to keep glucose within goal Creatinine 1.47 mildly elevated from baseline.  Gentle hydration. Check morning labs and replete electrolytes as needed. Possible aspiration pneumonia-check chest x-ray, n.p.o. Wound consult for the foot ulcers  N.p.o. until cleared by stroke swallow screen or formal swallow evaluation   He is a DNR-verified with the daughter.  Discussed my plan in detail with Dr. Leonides Schanz, ED provider. Discussed the plan in detail with the daughter at bedside as well. Answered all questions.   -- Amie Portland, MD [facility] Pager: [phone] If 7pm to 7am, please call on call as listed on AMION.   Present on admission: Acute ischemic stroke, DNR, possible aspiration pneumonia: Necrotic right foot   CRITICAL CARE ATTESTATION Performed by: Amie [place], MD Total critical care time: 75 minutes Critical care time was exclusive of separately billable procedures and treating other patients and/or supervising APPs/Residents/Students Critical care was necessary to treat or prevent imminent or life-threatening deterioration due to acute ischemic stroke, troponinemia This patient is critically ill and at significant risk for neurological worsening and/or death and care requires constant monitoring. Critical care was time spent personally by me on the following activities: development of treatment plan with patient  and/or surrogate as well as nursing, discussions with consultants, evaluation of patient's response to treatment, examination of patient, obtaining history from patient or surrogate, ordering and performing treatments and interventions, ordering and review of laboratory studies, ordering and review of radiographic studies, pulse oximetry, re-evaluation of patient's condition, participation in  multidisciplinary rounds and medical decision making of high complexity in the care of this patient.   Addendum Spoke with on-call cardiologist Dr. Blossom Hoops.  He would be seeing the patient at some point.  Based on preliminary discussion,he does not feel strongly about starting heparin drip at this time. I appreciate his prompt addressing of my questions and seeing the patient. We will follow his recommendations once patient is evaluated by him.  -- Amie Portland, MD [facility] Pager: [phone] If 7pm to 7am, please call on call as listed on AMION.

## 2019-12-31 ENCOUNTER — Encounter (HOSPITAL_COMMUNITY): Payer: Self-pay | Admitting: Student in an Organized Health Care Education/Training Program

## 2019-12-31 ENCOUNTER — Other Ambulatory Visit: Payer: Self-pay

## 2019-12-31 ENCOUNTER — Inpatient Hospital Stay (HOSPITAL_COMMUNITY): Payer: Medicare Other

## 2019-12-31 DIAGNOSIS — R001 Bradycardia, unspecified: Secondary | ICD-10-CM | POA: Diagnosis not present

## 2019-12-31 DIAGNOSIS — Z7982 Long term (current) use of aspirin: Secondary | ICD-10-CM | POA: Diagnosis not present

## 2019-12-31 DIAGNOSIS — Z8546 Personal history of malignant neoplasm of prostate: Secondary | ICD-10-CM | POA: Diagnosis not present

## 2019-12-31 DIAGNOSIS — I6523 Occlusion and stenosis of bilateral carotid arteries: Secondary | ICD-10-CM | POA: Diagnosis not present

## 2019-12-31 DIAGNOSIS — R1312 Dysphagia, oropharyngeal phase: Secondary | ICD-10-CM | POA: Diagnosis not present

## 2019-12-31 DIAGNOSIS — I672 Cerebral atherosclerosis: Secondary | ICD-10-CM | POA: Diagnosis not present

## 2019-12-31 DIAGNOSIS — Z7983 Long term (current) use of bisphosphonates: Secondary | ICD-10-CM | POA: Diagnosis not present

## 2019-12-31 DIAGNOSIS — N1832 Chronic kidney disease, stage 3b: Secondary | ICD-10-CM | POA: Diagnosis not present

## 2019-12-31 DIAGNOSIS — Z713 Dietary counseling and surveillance: Secondary | ICD-10-CM | POA: Diagnosis not present

## 2019-12-31 DIAGNOSIS — N390 Urinary tract infection, site not specified: Secondary | ICD-10-CM | POA: Diagnosis not present

## 2019-12-31 DIAGNOSIS — N179 Acute kidney failure, unspecified: Secondary | ICD-10-CM | POA: Diagnosis present

## 2019-12-31 DIAGNOSIS — J9811 Atelectasis: Secondary | ICD-10-CM | POA: Diagnosis not present

## 2019-12-31 DIAGNOSIS — Z79899 Other long term (current) drug therapy: Secondary | ICD-10-CM | POA: Diagnosis not present

## 2019-12-31 DIAGNOSIS — I6782 Cerebral ischemia: Secondary | ICD-10-CM | POA: Diagnosis not present

## 2019-12-31 DIAGNOSIS — I10 Essential (primary) hypertension: Secondary | ICD-10-CM | POA: Diagnosis not present

## 2019-12-31 DIAGNOSIS — R778 Other specified abnormalities of plasma proteins: Secondary | ICD-10-CM | POA: Diagnosis not present

## 2019-12-31 DIAGNOSIS — I96 Gangrene, not elsewhere classified: Secondary | ICD-10-CM | POA: Diagnosis not present

## 2019-12-31 DIAGNOSIS — R4182 Altered mental status, unspecified: Secondary | ICD-10-CM | POA: Diagnosis not present

## 2019-12-31 DIAGNOSIS — R29810 Facial weakness: Secondary | ICD-10-CM | POA: Diagnosis present

## 2019-12-31 DIAGNOSIS — I130 Hypertensive heart and chronic kidney disease with heart failure and stage 1 through stage 4 chronic kidney disease, or unspecified chronic kidney disease: Secondary | ICD-10-CM | POA: Diagnosis present

## 2019-12-31 DIAGNOSIS — I252 Old myocardial infarction: Secondary | ICD-10-CM | POA: Diagnosis not present

## 2019-12-31 DIAGNOSIS — Z20822 Contact with and (suspected) exposure to covid-19: Secondary | ICD-10-CM | POA: Diagnosis present

## 2019-12-31 DIAGNOSIS — R059 Cough, unspecified: Secondary | ICD-10-CM | POA: Diagnosis not present

## 2019-12-31 DIAGNOSIS — Z8249 Family history of ischemic heart disease and other diseases of the circulatory system: Secondary | ICD-10-CM | POA: Diagnosis not present

## 2019-12-31 DIAGNOSIS — E440 Moderate protein-calorie malnutrition: Secondary | ICD-10-CM | POA: Diagnosis present

## 2019-12-31 DIAGNOSIS — I248 Other forms of acute ischemic heart disease: Secondary | ICD-10-CM | POA: Diagnosis present

## 2019-12-31 DIAGNOSIS — E1159 Type 2 diabetes mellitus with other circulatory complications: Secondary | ICD-10-CM

## 2019-12-31 DIAGNOSIS — J690 Pneumonitis due to inhalation of food and vomit: Secondary | ICD-10-CM | POA: Diagnosis not present

## 2019-12-31 DIAGNOSIS — I6601 Occlusion and stenosis of right middle cerebral artery: Secondary | ICD-10-CM | POA: Diagnosis not present

## 2019-12-31 DIAGNOSIS — Z66 Do not resuscitate: Secondary | ICD-10-CM | POA: Diagnosis present

## 2019-12-31 DIAGNOSIS — Z7902 Long term (current) use of antithrombotics/antiplatelets: Secondary | ICD-10-CM | POA: Diagnosis not present

## 2019-12-31 DIAGNOSIS — R0989 Other specified symptoms and signs involving the circulatory and respiratory systems: Secondary | ICD-10-CM | POA: Diagnosis not present

## 2019-12-31 DIAGNOSIS — Z87891 Personal history of nicotine dependence: Secondary | ICD-10-CM | POA: Diagnosis not present

## 2019-12-31 DIAGNOSIS — R4781 Slurred speech: Secondary | ICD-10-CM | POA: Diagnosis present

## 2019-12-31 DIAGNOSIS — I69391 Dysphagia following cerebral infarction: Secondary | ICD-10-CM | POA: Diagnosis not present

## 2019-12-31 DIAGNOSIS — L8990 Pressure ulcer of unspecified site, unspecified stage: Secondary | ICD-10-CM | POA: Insufficient documentation

## 2019-12-31 DIAGNOSIS — H748X1 Other specified disorders of right middle ear and mastoid: Secondary | ICD-10-CM | POA: Diagnosis not present

## 2019-12-31 DIAGNOSIS — I6389 Other cerebral infarction: Secondary | ICD-10-CM

## 2019-12-31 DIAGNOSIS — E1152 Type 2 diabetes mellitus with diabetic peripheral angiopathy with gangrene: Secondary | ICD-10-CM | POA: Diagnosis present

## 2019-12-31 DIAGNOSIS — M2548 Effusion, other site: Secondary | ICD-10-CM | POA: Diagnosis not present

## 2019-12-31 DIAGNOSIS — I69322 Dysarthria following cerebral infarction: Secondary | ICD-10-CM | POA: Diagnosis not present

## 2019-12-31 DIAGNOSIS — I25119 Atherosclerotic heart disease of native coronary artery with unspecified angina pectoris: Secondary | ICD-10-CM

## 2019-12-31 DIAGNOSIS — R195 Other fecal abnormalities: Secondary | ICD-10-CM | POA: Diagnosis not present

## 2019-12-31 DIAGNOSIS — F329 Major depressive disorder, single episode, unspecified: Secondary | ICD-10-CM | POA: Diagnosis not present

## 2019-12-31 DIAGNOSIS — E7800 Pure hypercholesterolemia, unspecified: Secondary | ICD-10-CM | POA: Diagnosis not present

## 2019-12-31 DIAGNOSIS — I160 Hypertensive urgency: Secondary | ICD-10-CM | POA: Diagnosis not present

## 2019-12-31 DIAGNOSIS — D500 Iron deficiency anemia secondary to blood loss (chronic): Secondary | ICD-10-CM | POA: Diagnosis not present

## 2019-12-31 DIAGNOSIS — D72829 Elevated white blood cell count, unspecified: Secondary | ICD-10-CM | POA: Diagnosis not present

## 2019-12-31 DIAGNOSIS — E785 Hyperlipidemia, unspecified: Secondary | ICD-10-CM | POA: Diagnosis present

## 2019-12-31 DIAGNOSIS — M109 Gout, unspecified: Secondary | ICD-10-CM | POA: Diagnosis present

## 2019-12-31 DIAGNOSIS — Z8673 Personal history of transient ischemic attack (TIA), and cerebral infarction without residual deficits: Secondary | ICD-10-CM | POA: Diagnosis not present

## 2019-12-31 DIAGNOSIS — I5032 Chronic diastolic (congestive) heart failure: Secondary | ICD-10-CM | POA: Diagnosis present

## 2019-12-31 DIAGNOSIS — R414 Neurologic neglect syndrome: Secondary | ICD-10-CM | POA: Diagnosis present

## 2019-12-31 DIAGNOSIS — G9389 Other specified disorders of brain: Secondary | ICD-10-CM | POA: Diagnosis not present

## 2019-12-31 DIAGNOSIS — Z7901 Long term (current) use of anticoagulants: Secondary | ICD-10-CM | POA: Diagnosis not present

## 2019-12-31 DIAGNOSIS — I639 Cerebral infarction, unspecified: Secondary | ICD-10-CM | POA: Diagnosis not present

## 2019-12-31 DIAGNOSIS — I739 Peripheral vascular disease, unspecified: Secondary | ICD-10-CM | POA: Diagnosis not present

## 2019-12-31 DIAGNOSIS — G319 Degenerative disease of nervous system, unspecified: Secondary | ICD-10-CM | POA: Diagnosis not present

## 2019-12-31 DIAGNOSIS — Z794 Long term (current) use of insulin: Secondary | ICD-10-CM | POA: Diagnosis not present

## 2019-12-31 DIAGNOSIS — Z880 Allergy status to penicillin: Secondary | ICD-10-CM | POA: Diagnosis not present

## 2019-12-31 DIAGNOSIS — I517 Cardiomegaly: Secondary | ICD-10-CM | POA: Diagnosis not present

## 2019-12-31 DIAGNOSIS — E782 Mixed hyperlipidemia: Secondary | ICD-10-CM | POA: Diagnosis not present

## 2019-12-31 DIAGNOSIS — I129 Hypertensive chronic kidney disease with stage 1 through stage 4 chronic kidney disease, or unspecified chronic kidney disease: Secondary | ICD-10-CM | POA: Diagnosis not present

## 2019-12-31 DIAGNOSIS — I69354 Hemiplegia and hemiparesis following cerebral infarction affecting left non-dominant side: Secondary | ICD-10-CM | POA: Diagnosis present

## 2019-12-31 DIAGNOSIS — E1142 Type 2 diabetes mellitus with diabetic polyneuropathy: Secondary | ICD-10-CM | POA: Diagnosis not present

## 2019-12-31 DIAGNOSIS — D509 Iron deficiency anemia, unspecified: Secondary | ICD-10-CM | POA: Diagnosis not present

## 2019-12-31 DIAGNOSIS — D631 Anemia in chronic kidney disease: Secondary | ICD-10-CM | POA: Diagnosis not present

## 2019-12-31 DIAGNOSIS — Z833 Family history of diabetes mellitus: Secondary | ICD-10-CM | POA: Diagnosis not present

## 2019-12-31 DIAGNOSIS — I6503 Occlusion and stenosis of bilateral vertebral arteries: Secondary | ICD-10-CM | POA: Diagnosis not present

## 2019-12-31 DIAGNOSIS — Z515 Encounter for palliative care: Secondary | ICD-10-CM | POA: Diagnosis not present

## 2019-12-31 DIAGNOSIS — I69392 Facial weakness following cerebral infarction: Secondary | ICD-10-CM | POA: Diagnosis not present

## 2019-12-31 DIAGNOSIS — R2689 Other abnormalities of gait and mobility: Secondary | ICD-10-CM | POA: Diagnosis not present

## 2019-12-31 DIAGNOSIS — D649 Anemia, unspecified: Secondary | ICD-10-CM | POA: Diagnosis not present

## 2019-12-31 DIAGNOSIS — E1165 Type 2 diabetes mellitus with hyperglycemia: Secondary | ICD-10-CM | POA: Diagnosis not present

## 2019-12-31 DIAGNOSIS — R7989 Other specified abnormal findings of blood chemistry: Secondary | ICD-10-CM | POA: Diagnosis not present

## 2019-12-31 DIAGNOSIS — I2510 Atherosclerotic heart disease of native coronary artery without angina pectoris: Secondary | ICD-10-CM | POA: Diagnosis not present

## 2019-12-31 DIAGNOSIS — Z7984 Long term (current) use of oral hypoglycemic drugs: Secondary | ICD-10-CM | POA: Diagnosis not present

## 2019-12-31 DIAGNOSIS — R509 Fever, unspecified: Secondary | ICD-10-CM | POA: Diagnosis not present

## 2019-12-31 DIAGNOSIS — E1122 Type 2 diabetes mellitus with diabetic chronic kidney disease: Secondary | ICD-10-CM | POA: Diagnosis not present

## 2019-12-31 DIAGNOSIS — I63511 Cerebral infarction due to unspecified occlusion or stenosis of right middle cerebral artery: Secondary | ICD-10-CM | POA: Diagnosis not present

## 2019-12-31 DIAGNOSIS — G8194 Hemiplegia, unspecified affecting left nondominant side: Secondary | ICD-10-CM | POA: Diagnosis present

## 2019-12-31 DIAGNOSIS — F0390 Unspecified dementia without behavioral disturbance: Secondary | ICD-10-CM | POA: Diagnosis not present

## 2019-12-31 DIAGNOSIS — I70263 Atherosclerosis of native arteries of extremities with gangrene, bilateral legs: Secondary | ICD-10-CM | POA: Diagnosis not present

## 2019-12-31 DIAGNOSIS — R159 Full incontinence of feces: Secondary | ICD-10-CM | POA: Diagnosis present

## 2019-12-31 DIAGNOSIS — G894 Chronic pain syndrome: Secondary | ICD-10-CM | POA: Diagnosis not present

## 2019-12-31 LAB — COMPREHENSIVE METABOLIC PANEL
ALT: 24 U/L (ref 0–44)
AST: 22 U/L (ref 15–41)
Albumin: 2.6 g/dL — ABNORMAL LOW (ref 3.5–5.0)
Alkaline Phosphatase: 70 U/L (ref 38–126)
Anion gap: 9 (ref 5–15)
BUN: 18 mg/dL (ref 8–23)
CO2: 23 mmol/L (ref 22–32)
Calcium: 8 mg/dL — ABNORMAL LOW (ref 8.9–10.3)
Chloride: 106 mmol/L (ref 98–111)
Creatinine, Ser: 1.47 mg/dL — ABNORMAL HIGH (ref 0.61–1.24)
GFR, Estimated: 46 mL/min — ABNORMAL LOW (ref >60)
Glucose, Bld: 221 mg/dL — ABNORMAL HIGH (ref 70–99)
Potassium: 3.6 mmol/L (ref 3.5–5.1)
Sodium: 138 mmol/L (ref 135–145)
Total Bilirubin: 0.3 mg/dL (ref 0.3–1.2)
Total Protein: 5 g/dL — ABNORMAL LOW (ref 6.5–8.1)

## 2019-12-31 LAB — URINALYSIS, ROUTINE W REFLEX MICROSCOPIC
Bilirubin Urine: NEGATIVE
Glucose, UA: NEGATIVE mg/dL
Hgb urine dipstick: NEGATIVE
Ketones, ur: NEGATIVE mg/dL
Leukocytes,Ua: NEGATIVE
Nitrite: NEGATIVE
Protein, ur: 100 mg/dL — AB
Specific Gravity, Urine: 1.032 — ABNORMAL HIGH (ref 1.005–1.030)
pH: 5 (ref 5.0–8.0)

## 2019-12-31 LAB — HEMOGLOBIN A1C
Hgb A1c MFr Bld: 6 % — ABNORMAL HIGH (ref 4.8–5.6)
Mean Plasma Glucose: 125.5 mg/dL

## 2019-12-31 LAB — TROPONIN I (HIGH SENSITIVITY)
Troponin I (High Sensitivity): 215 ng/L (ref <18)
Troponin I (High Sensitivity): 209 ng/L (ref <18)

## 2019-12-31 LAB — GLUCOSE, CAPILLARY
Glucose-Capillary: 156 mg/dL — ABNORMAL HIGH (ref 70–99)
Glucose-Capillary: 120 mg/dL — ABNORMAL HIGH (ref 70–99)
Glucose-Capillary: 150 mg/dL — ABNORMAL HIGH (ref 70–99)
Glucose-Capillary: 226 mg/dL — ABNORMAL HIGH (ref 70–99)

## 2019-12-31 LAB — LIPID PANEL
Cholesterol: 85 mg/dL (ref 0–200)
HDL: 44 mg/dL (ref >40)
LDL Cholesterol: 11 mg/dL (ref 0–99)
Total CHOL/HDL Ratio: 1.9 RATIO
Triglycerides: 152 mg/dL — ABNORMAL HIGH (ref <150)
VLDL: 30 mg/dL (ref 0–40)

## 2019-12-31 LAB — ECHOCARDIOGRAM COMPLETE
Area-P 1/2: 2.83 cm2
Height: 67 in
S' Lateral: 3.3 cm
Weight: 2560.02 oz

## 2019-12-31 LAB — RAPID URINE DRUG SCREEN, HOSP PERFORMED
Amphetamines: NOT DETECTED
Barbiturates: NOT DETECTED
Benzodiazepines: NOT DETECTED
Cocaine: NOT DETECTED
Opiates: NOT DETECTED
Tetrahydrocannabinol: NOT DETECTED

## 2019-12-31 LAB — PROTIME-INR
INR: 1.2 (ref 0.8–1.2)
Prothrombin Time: 14.3 seconds (ref 11.4–15.2)

## 2019-12-31 LAB — MRSA PCR SCREENING: MRSA by PCR: NEGATIVE

## 2019-12-31 LAB — APTT: aPTT: 34 seconds (ref 24–36)

## 2019-12-31 LAB — ETHANOL: Alcohol, Ethyl (B): <10 mg/dL (ref <10)

## 2019-12-31 IMAGING — MR MR HEAD W/O CM
9 series · 48 of 48 positions shown · non-contrast
Comparison: Prior CTs from [DATE].

CLINICAL DATA: Initial evaluation for acute stroke, left-sided
deficits.

EXAM:
MRI HEAD WITHOUT CONTRAST
TECHNIQUE: Multiplanar, multiecho pulse sequences of the brain and surrounding
structures were obtained without intravenous contrast.

[Series 5: DWI · axial · 3.0mm · 0.88mm/px · z∈[-61,+81]mm · 10 of 100 slices shown (1 of 4)]
[im 1/100]
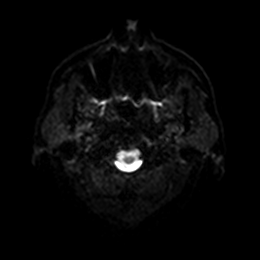
[im 12/100]
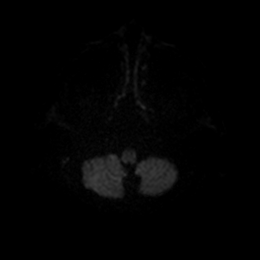
[im 23/100]
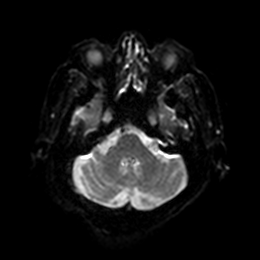
[im 34/100]
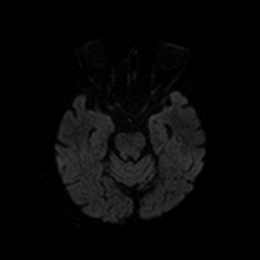
[im 45/100]
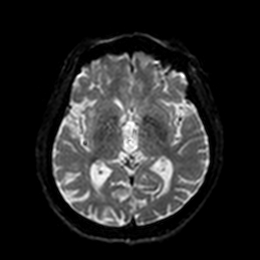
[im 56/100]
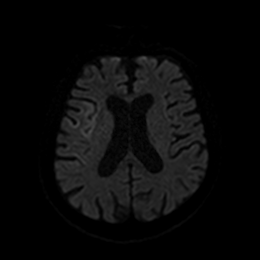
[im 67/100]
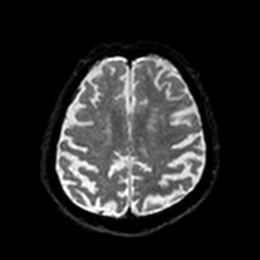
[im 78/100]
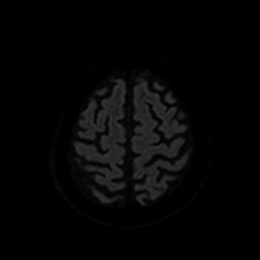
[im 89/100]
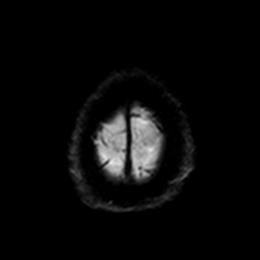
[im 100/100]
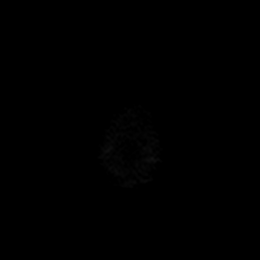

[Series 6: DWI · axial · 3.0mm · 0.88mm/px · z∈[-61,+81]mm · 5 of 50 slices shown (2 of 4)]
[im 1/50]
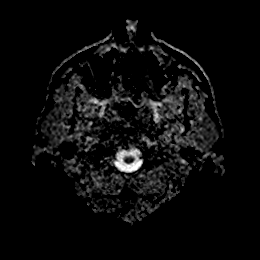
[im 13/50]
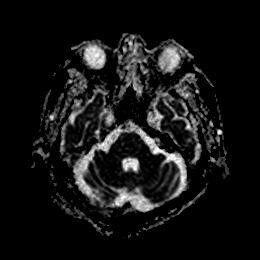
[im 25/50]
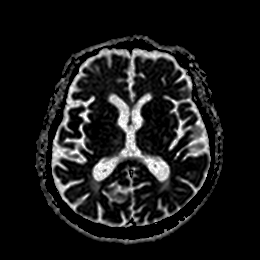
[im 37/50]
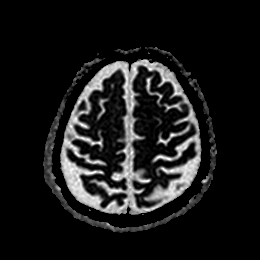
[im 50/50]
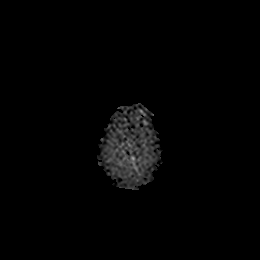

[Series 7: DWI · coronal · 4.0mm · 0.88mm/px · 7 of 68 slices shown (3 of 4)]
[im 1/68]
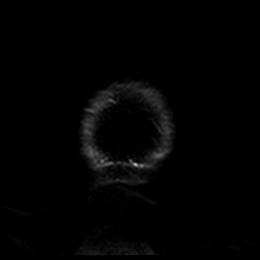
[im 12/68]
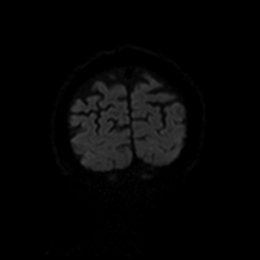
[im 23/68]
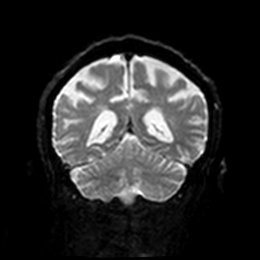
[im 34/68]
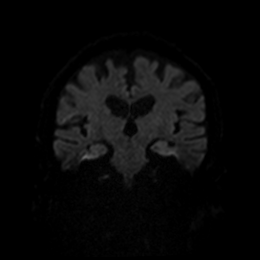
[im 45/68]
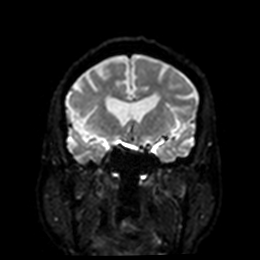
[im 56/68]
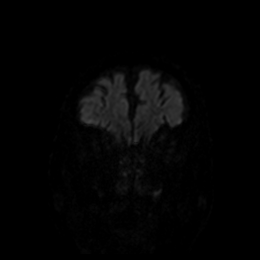
[im 68/68]
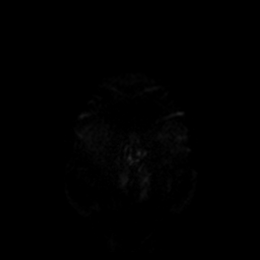

[Series 8: DWI · coronal · 4.0mm · 0.88mm/px · 3 of 34 slices shown (4 of 4)]
[im 1/34]
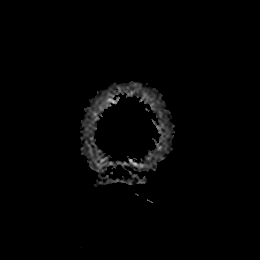
[im 17/34]
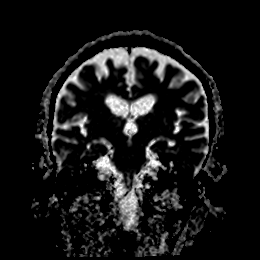
[im 34/34]
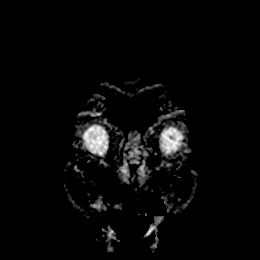

[Series 9: FLAIR · axial · 5.0mm · 0.45mm/px · z∈[-56,+83]mm · 3 of 25 slices shown]
[im 1/25]
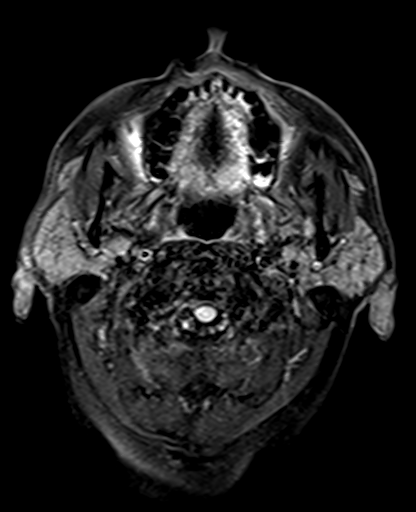
[im 13/25]
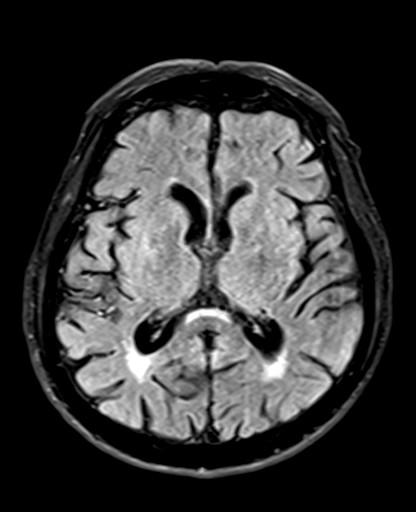
[im 25/25]
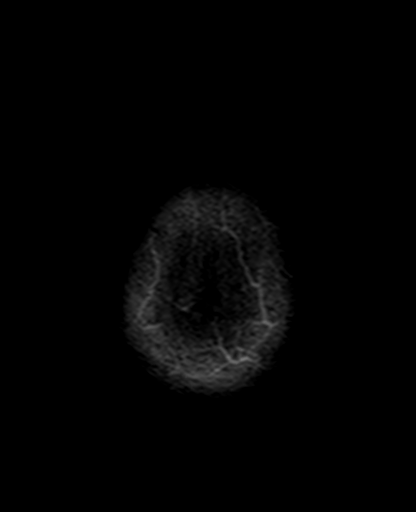

[Series 10: mag_images · axial · 3.0mm · 0.90mm/px · z∈[-61,+87]mm · 5 of 52 slices shown]
[im 1/52]
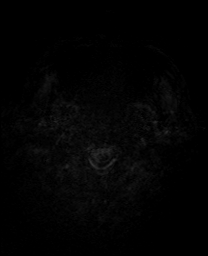
[im 13/52]
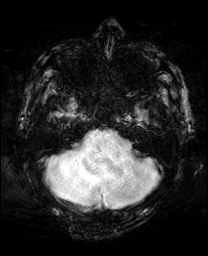
[im 26/52]
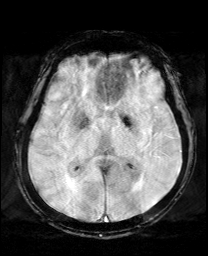
[im 39/52]
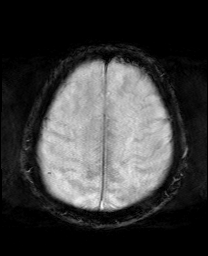
[im 52/52]
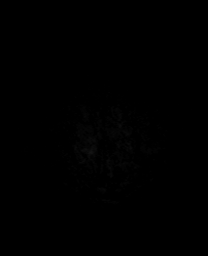

[Series 11: pha_images · axial · 3.0mm · 0.90mm/px · z∈[-61,+87]mm · 5 of 52 slices shown]
[im 1/52]
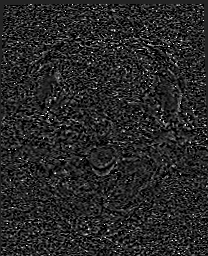
[im 13/52]
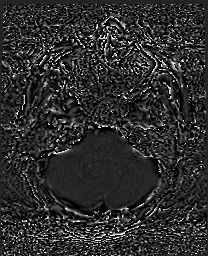
[im 26/52]
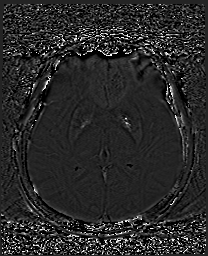
[im 39/52]
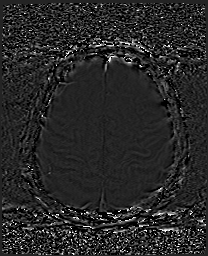
[im 52/52]
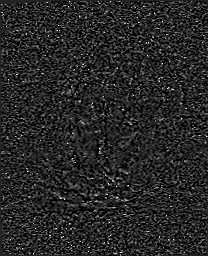

[Series 12: swi_images · axial · 3.0mm · 0.90mm/px · z∈[-61,+87]mm · 5 of 52 slices shown]
[im 1/52]
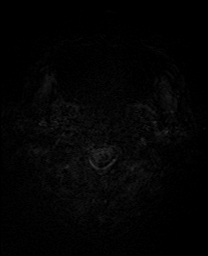
[im 13/52]
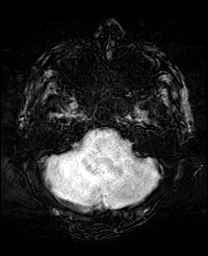
[im 26/52]
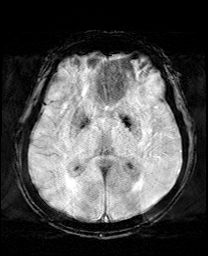
[im 39/52]
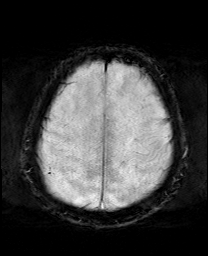
[im 52/52]
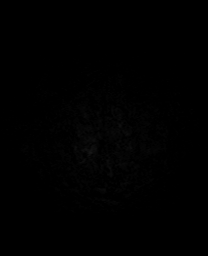

[Series 13: mip_images(sw) · axial · 24.0mm · 0.90mm/px · z∈[-51,+77]mm · 5 of 45 slices shown]
[im 1/45]
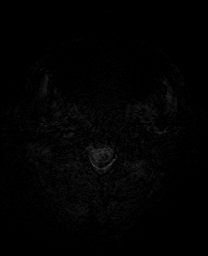
[im 12/45]
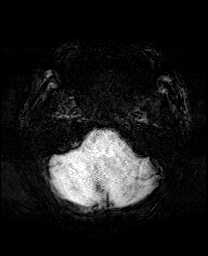
[im 23/45]
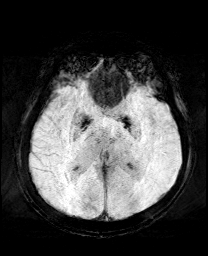
[im 34/45]
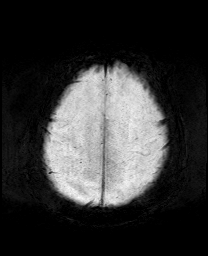
[im 45/45]
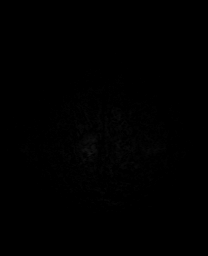

[48 of 48 positions shown; findings below may reference images not displayed]

FINDINGS: Brain: Limited stroke protocol MRI was performed.

Age-related cerebral atrophy. Patchy T2/FLAIR hyperintensity within
the periventricular white matter most consistent with chronic small
vessel ischemic disease, mild for age.

Patchy and hazy diffusion abnormality seen involving the right
insular cortex and overlying right frontal operculum, consistent
with acute right MCA territory ischemia. Patchy involvement of the
adjacent subinsular white matter. Finding likely related to the
previously identified severe right M1 stenosis. No associated
hemorrhage or mass effect. No other evidence for acute or subacute
ischemia. Gray-white matter differentiation otherwise maintained. No
other areas of encephalomalacia to suggest chronic cortical
infarction. No foci of susceptibility artifact to suggest acute or
chronic intracranial hemorrhage.

No mass lesion, midline shift or mass effect. No hydrocephalus or
extra-axial fluid collection.

Vascular: Major intracranial vascular flow voids are grossly
maintained skull base.

Skull and upper cervical spine: Grossly unremarkable on this limited
exam.

Sinuses/Orbits: Globes and orbital soft tissues demonstrate no acute
finding. Paranasal sinuses are grossly clear. Trace right mastoid
effusion noted.

Other: None.
IMPRESSION: 1. Patchy and hazy diffusion abnormality involving the right insular
cortex, subinsular white matter, and overlying right frontal
operculum, consistent with acute right MCA territory ischemia. No
associated hemorrhage or mass effect. Finding is consistent with the
previously identified severe right M1 stenosis.
2. No other acute intracranial abnormality.
3. Age-related cerebral atrophy with mild chronic small vessel
ischemic disease.

## 2019-12-31 MED ORDER — SODIUM CHLORIDE 0.9 % IV SOLN: INTRAVENOUS | Status: DC

## 2019-12-31 MED ORDER — STROKE: EARLY STAGES OF RECOVERY BOOK
Freq: Once | Status: AC
  Filled 2019-12-31: qty 1

## 2019-12-31 MED ORDER — FINASTERIDE 5 MG PO TABS
5.0000 mg | ORAL_TABLET | Freq: Every day | ORAL | Status: DC
  Administered 2019-12-31 – 2020-01-10 (×11): 5 mg via ORAL
  Filled 2019-12-31 (×11): qty 1

## 2019-12-31 MED ORDER — DONEPEZIL HCL 10 MG PO TABS
10.0000 mg | ORAL_TABLET | Freq: Every day | ORAL | Status: DC
  Administered 2019-12-31 – 2020-01-10 (×11): 10 mg via ORAL
  Filled 2019-12-31 (×12): qty 1

## 2019-12-31 MED ORDER — INSULIN ASPART 100 UNIT/ML ~~LOC~~ SOLN
0.0000 [IU] | Freq: Three times a day (TID) | SUBCUTANEOUS | Status: DC
  Administered 2019-12-31: 2 [IU] via SUBCUTANEOUS
  Administered 2019-12-31: 3 [IU] via SUBCUTANEOUS
  Administered 2019-12-31: 1 [IU] via SUBCUTANEOUS
  Administered 2020-01-01: 5 [IU] via SUBCUTANEOUS
  Administered 2020-01-01: 2 [IU] via SUBCUTANEOUS
  Administered 2020-01-02: 1 [IU] via SUBCUTANEOUS
  Administered 2020-01-02: 2 [IU] via SUBCUTANEOUS

## 2019-12-31 MED ORDER — CLEVIDIPINE BUTYRATE 0.5 MG/ML IV EMUL: 0.0000 mg/h | INTRAVENOUS | Status: DC

## 2019-12-31 MED ORDER — GABAPENTIN 300 MG PO CAPS
300.0000 mg | ORAL_CAPSULE | Freq: Two times a day (BID) | ORAL | Status: DC
  Administered 2019-12-31 – 2020-01-10 (×21): 300 mg via ORAL
  Filled 2019-12-31 (×11): qty 1
  Filled 2019-12-31: qty 3
  Filled 2019-12-31 (×9): qty 1

## 2019-12-31 MED ORDER — ATORVASTATIN CALCIUM 80 MG PO TABS
80.0000 mg | ORAL_TABLET | Freq: Every day | ORAL | Status: DC
  Administered 2019-12-31: 80 mg via ORAL
  Filled 2019-12-31: qty 1

## 2019-12-31 MED ORDER — CLEVIDIPINE BUTYRATE 0.5 MG/ML IV EMUL
0.0000 mg/h | INTRAVENOUS | Status: DC
  Administered 2019-12-31 – 2020-01-01 (×6): 8 mg/h via INTRAVENOUS
  Administered 2020-01-01 – 2020-01-02 (×2): 4 mg/h via INTRAVENOUS
  Administered 2020-01-02: 7 mg/h via INTRAVENOUS
  Administered 2020-01-02: 12 mg/h via INTRAVENOUS
  Administered 2020-01-02: 6 mg/h via INTRAVENOUS
  Administered 2020-01-02: 7 mg/h via INTRAVENOUS
  Administered 2020-01-02: 15 mg/h via INTRAVENOUS
  Administered 2020-01-02: 3 mg/h via INTRAVENOUS
  Administered 2020-01-02: 6 mg/h via INTRAVENOUS
  Administered 2020-01-03: 7 mg/h via INTRAVENOUS
  Administered 2020-01-03: 4 mg/h via INTRAVENOUS
  Administered 2020-01-04: 2 mg/h via INTRAVENOUS
  Filled 2019-12-31 (×3): qty 50
  Filled 2019-12-31: qty 100
  Filled 2019-12-31 (×8): qty 50
  Filled 2019-12-31: qty 100
  Filled 2019-12-31 (×9): qty 50

## 2019-12-31 MED ORDER — ACETAMINOPHEN 160 MG/5ML PO SOLN: 650.0000 mg | ORAL | Status: DC | PRN

## 2019-12-31 MED ORDER — SENNOSIDES-DOCUSATE SODIUM 8.6-50 MG PO TABS: 1.0000 | ORAL_TABLET | Freq: Every evening | ORAL | Status: DC | PRN

## 2019-12-31 MED ORDER — ALTEPLASE (STROKE) FULL DOSE INFUSION: 0.9000 mg/kg | Freq: Once | INTRAVENOUS | Status: DC

## 2019-12-31 MED ORDER — CLEVIDIPINE BUTYRATE 0.5 MG/ML IV EMUL
INTRAVENOUS | Status: AC
  Administered 2019-12-31: 2 mg/h via INTRAVENOUS
  Filled 2019-12-31: qty 50

## 2019-12-31 MED ORDER — PANTOPRAZOLE SODIUM 40 MG IV SOLR
40.0000 mg | Freq: Every day | INTRAVENOUS | Status: DC
  Administered 2019-12-31: 40 mg via INTRAVENOUS
  Filled 2019-12-31: qty 40

## 2019-12-31 MED ORDER — MEMANTINE HCL 10 MG PO TABS
10.0000 mg | ORAL_TABLET | Freq: Two times a day (BID) | ORAL | Status: DC
  Administered 2019-12-31 – 2020-01-10 (×21): 10 mg via ORAL
  Filled 2019-12-31 (×22): qty 1

## 2019-12-31 MED ORDER — LABETALOL HCL 5 MG/ML IV SOLN: 20.0000 mg | Freq: Once | INTRAVENOUS | Status: DC

## 2019-12-31 MED ORDER — DULOXETINE HCL 30 MG PO CPEP
30.0000 mg | ORAL_CAPSULE | Freq: Every day | ORAL | Status: DC
  Administered 2019-12-31 – 2020-01-10 (×11): 30 mg via ORAL
  Filled 2019-12-31 (×11): qty 1

## 2019-12-31 MED ORDER — ACETAMINOPHEN 325 MG PO TABS
650.0000 mg | ORAL_TABLET | ORAL | Status: DC | PRN
  Administered 2020-01-09: 650 mg via ORAL
  Filled 2019-12-31 (×2): qty 2

## 2019-12-31 MED ORDER — LABETALOL HCL 5 MG/ML IV SOLN
10.0000 mg | Freq: Once | INTRAVENOUS | Status: AC
  Administered 2019-12-31: 10 mg via INTRAVENOUS

## 2019-12-31 MED ORDER — SODIUM CHLORIDE 0.9 % IV SOLN: 50.0000 mL | Freq: Once | INTRAVENOUS | Status: DC

## 2019-12-31 MED ORDER — CARVEDILOL 3.125 MG PO TABS
3.1250 mg | ORAL_TABLET | Freq: Two times a day (BID) | ORAL | Status: DC
  Administered 2019-12-31 – 2020-01-04 (×8): 3.125 mg via ORAL
  Filled 2019-12-31 (×8): qty 1

## 2019-12-31 MED ORDER — ALTEPLASE (STROKE) FULL DOSE INFUSION
0.9000 mg/kg | Freq: Once | INTRAVENOUS | Status: AC
  Administered 2019-12-31: 65.3 mg via INTRAVENOUS
  Filled 2019-12-31: qty 100

## 2019-12-31 MED ORDER — ACETAMINOPHEN 650 MG RE SUPP: 650.0000 mg | RECTAL | Status: DC | PRN

## 2019-12-31 MED ORDER — ALLOPURINOL 100 MG PO TABS
100.0000 mg | ORAL_TABLET | Freq: Every day | ORAL | Status: DC
  Administered 2019-12-31 – 2020-01-10 (×11): 100 mg via ORAL
  Filled 2019-12-31 (×11): qty 1

## 2019-12-31 MED ORDER — FERROUS SULFATE 325 (65 FE) MG PO TABS
325.0000 mg | ORAL_TABLET | Freq: Every day | ORAL | Status: DC
  Administered 2019-12-31 – 2020-01-08 (×9): 325 mg via ORAL
  Filled 2019-12-31 (×9): qty 1

## 2019-12-31 MED ORDER — ISOSORBIDE MONONITRATE ER 30 MG PO TB24
60.0000 mg | ORAL_TABLET | Freq: Every day | ORAL | Status: DC
  Administered 2019-12-31 – 2020-01-03 (×4): 60 mg via ORAL
  Filled 2019-12-31 (×4): qty 2

## 2019-12-31 MED ORDER — CHLORHEXIDINE GLUCONATE CLOTH 2 % EX PADS
6.0000 | MEDICATED_PAD | Freq: Every day | CUTANEOUS | Status: DC
  Administered 2019-12-31 – 2020-01-10 (×11): 6 via TOPICAL

## 2019-12-31 MED ORDER — ATORVASTATIN CALCIUM 40 MG PO TABS
40.0000 mg | ORAL_TABLET | Freq: Every day | ORAL | Status: DC
  Administered 2020-01-01 – 2020-01-10 (×10): 40 mg via ORAL
  Filled 2019-12-31 (×10): qty 1

## 2019-12-31 NOTE — Consult Note (Addendum)
WOC Nurse Consult Note: Patient receiving care in [place].  Assisted with turning and wound assessment by 2 primary RNs. 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 The medial gluteal fold has a small amount of MASD-ITD. No open wounds at this time. Pressure Injury POA: No Measurement: Wound bed: Drainage (amount, consistency, odor)  Periwound: intact Dressing procedure/placement/frequency: Cleanse gluteal fold, pat dry. Cut a narrow strip of Aquacel Kellie Simmering [phone]) and lay in the gluteal fold. Cover with a sacral foam dressing. Change the Aquacel strip daily and prn.  The foam dressing can be used up to 3 days. For the right foot:  Place a small piece of Xeroform gauze Kellie Simmering [phone]) between the right lateral toe and the right middle toe. Place dry gauze around the middle toe. Secure with kerlex. I have also ordered bilateral Prevalon heel lift boots. Monitor the wound area(s) for worsening of condition such as: Signs/symptoms of infection,  Increase in size,  Development of or worsening of odor, Development of pain, or increased pain at the affected locations.  Notify the medical team if any of these develop.  Thank you for the consult.  Discussed plan of care with the bedside nurses.  [facility] nurse will not follow at this time.  Please re-consult the [place] team if needed.  Val Riles, RN, MSN, CWOCN, CNS-BC, pager [phone]

## 2019-12-31 NOTE — Plan of Care (Signed)
  Problem: Self-Care: Goal: Ability to participate in self-care as condition permits will improve Outcome: Progressing Goal: Verbalization of feelings and concerns over difficulty with self-care will improve Outcome: Progressing Goal: Ability to communicate needs accurately will improve Outcome: Progressing   Problem: Nutrition: Goal: Dietary intake will improve Outcome: Progressing   Problem: Coping: Goal: Will verbalize positive feelings about self Outcome: Completed/Met

## 2019-12-31 NOTE — Progress Notes (Signed)
STROKE TEAM PROGRESS NOTE   INTERVAL HISTORY His RN is at the bedside.  Pt is working with Therapist, sports for [employer] screening and he passed it well. He is AAO x3, still has left facial droop and slight left UE drift but LLE strong. MRI showed right MCA patchy infarcts. Will repeat CT in 24 h post tPA.  OBJECTIVE Vitals:   12/31/19 0615 12/31/19 0630 12/31/19 0700 12/31/19 0800  BP: (!) 139/52 (!) 137/50 (!) 139/47 (!) 142/50  Pulse: 74 71 72 74  Resp: 15 14 16 17   Temp:    97.6 F (36.4 C)  TempSrc:    Oral  SpO2: 100% 100% 100% 95%    CBC:  Recent Labs  Lab 12/30/19 1210 12/30/19 1210 12/30/19 2315 12/30/19 2324  WBC 7.8  --  7.0  --   NEUTROABS  --   --  4.9  --   HGB 11.2*   < > 8.7* 9.5*  HCT 35.8*   < > 27.9* 28.0*  MCV 83.3  --  85.3  --   PLT 308  --  235  --    < > = values in this interval not displayed.    Basic Metabolic Panel:  Recent Labs  Lab 12/30/19 1210 12/30/19 1210 12/30/19 2315 12/30/19 2324  NA 143   < > 138 141  K 3.8   < > 3.6 3.6  CL 108   < > 106 103  CO2 21*  --  23  --   GLUCOSE 130*   < > 221* 203*  BUN 18   < > 18 18  CREATININE 1.24   < > 1.47* 1.30*  CALCIUM 9.1  --  8.0*  --    < > = values in this interval not displayed.    Lipid Panel:     Component Value Date/Time   CHOL 85 12/31/2019 0318   TRIG 152 (H) 12/31/2019 0318   HDL 44 12/31/2019 0318   CHOLHDL 1.9 12/31/2019 0318   VLDL 30 12/31/2019 0318   LDLCALC 11 12/31/2019 0318   HgbA1c:  Lab Results  Component Value Date   HGBA1C 6.0 (H) 12/31/2019   Urine Drug Screen: No results found for: LABOPIA, COCAINSCRNUR, LABBENZ, AMPHETMU, THCU, LABBARB  Alcohol Level     Component Value Date/Time   ETH <10 12/30/2019 2315    IMAGING  CT Code Stroke CTA Head W/WO contrast CT Code Stroke CTA Neck W/WO contrast 12/31/2019 IMPRESSION:  1. Negative CTA for emergent large vessel occlusion.  2. Approximate 8-9 mm severe near occlusive stenosis of the proximal right M1  segment. Right MCA branches attenuated but patent distally with no visible downstream occlusion.  3. Additional severe proximal right A1 stenosis.  4. Atheromatous change about the carotid bifurcations with associated stenoses of up to 50% on the right and 45% on the left.  5. Atheromatous change about the right carotid siphon with associated moderate to severe multifocal stenoses.  6. Small amount of secretions within the proximal right mainstem bronchus, suggesting that this patient is at risk for aspiration.   MR BRAIN WO CONTRAST 12/31/2019 IMPRESSION:  1. Patchy and hazy diffusion abnormality involving the right insular cortex, subinsular white matter, and overlying right frontal operculum, consistent with acute right MCA territory ischemia. No associated hemorrhage or mass effect. Finding is consistent with the previously identified severe right M1 stenosis.  2. No other acute intracranial abnormality.  3. Age-related cerebral atrophy with mild chronic small vessel ischemic  disease.   CT HEAD CODE STROKE WO CONTRAST 12/30/2019 IMPRESSION:  1. No acute intracranial infarct or other abnormality.  2. ASPECTS is 10.  3. Age-appropriate cerebral atrophy with mild chronic small vessel ischemic disease.   DG Chest 2 View 12/30/2019 IMPRESSION:  1. Stable cardiomegaly. No pulmonary venous congestion.  2. Low lung volumes.   Transthoracic Echocardiogram  1. Left ventricular ejection fraction, by estimation, is 60 to 65%. The  left ventricle has normal function. The left ventricle has no regional  wall motion abnormalities. There is mild concentric left ventricular  hypertrophy. Left ventricular diastolic  parameters are consistent with Grade II diastolic dysfunction  (pseudonormalization). Elevated left atrial pressure. The E/e' is 54.  2. Right ventricular systolic function is normal. The right ventricular  size is normal. Tricuspid regurgitation signal is inadequate for assessing   PA pressure.  3. Left atrial size was mildly dilated.  4. The mitral valve is degenerative. No evidence of mitral valve  regurgitation. No evidence of mitral stenosis.  5. The aortic valve is tricuspid. Aortic valve regurgitation is not  visualized. No aortic stenosis is present.  6. The inferior vena cava is normal in size with greater than 50%  respiratory variability, suggesting right atrial pressure of 3 mmHg.  ECG - SR rate 70 BPM. (See cardiology reading for complete details)  PHYSICAL EXAM  Temp:  [97.6 F (36.4 C)-98 F (36.7 C)] 97.6 F (36.4 C) (11/19 0800) Pulse Rate:  [67-109] 74 (11/19 0800) Resp:  [12-44] 17 (11/19 0800) BP: (113-247)/(45-103) 142/50 (11/19 0800) SpO2:  [90 %-100 %] 95 % (11/19 0800) Weight:  [72.6 kg] 72.6 kg (11/18 1205)  General - Well nourished, well developed, in no apparent distress.  Ophthalmologic - fundi not visualized due to noncooperation.  Cardiovascular - Regular rhythm and rate.  Mental Status -  Level of arousal and orientation to time, place, and person were intact. Language including expression, naming, repetition, comprehension was assessed and found intact.  Cranial Nerves II - XII - II - Visual field intact OU. III, IV, VI - Extraocular movements intact. V - Facial sensation intact bilaterally. VII - mild left facial droop. VIII - Hearing & vestibular intact bilaterally. X - Palate elevates symmetrically. XI - Chin turning & shoulder shrug intact bilaterally. XII - Tongue protrusion intact.  Motor Strength - The patient's strength was normal in all extremities except mild left UE pronator drift.  Bulk was normal and fasciculations were absent.   Motor Tone - Muscle tone was assessed at the neck and appendages and was normal.  Reflexes - The patient's reflexes were symmetrical in all extremities and he had no pathological reflexes.  Sensory - Light touch, temperature/pinprick were assessed and were symmetrical.     Coordination - The patient had normal movements in the hands with no ataxia or dysmetria.  Tremor was absent.  Gait and Station - deferred.   ASSESSMENT/PLAN [name] is a 86 y.o. male with history of coronary artery disease, CKD stage III, dementia, diabetes, diastolic dysfunction, peripheral vascular disease, prostate cancer, history of multiple syncopal episodes, presented to the emergency room for evaluation of sudden onset of slurred speech, difficulty swallowing and left-sided weakness. He was scheduled for a GI scoping today, but could not go ahead with the procedure because of severely elevated blood pressures with systolic in the 315V.  His Plavix was on hold for procedure. The patient received IV t-PA Friday 12/31/19 at [place].  Stroke: acute right MCA pachy infarcts  with right M1 occlusion s/p tPA - cardioembolic vs. Large vessel disease source  CT Head - No acute intracranial infarct or other abnormality. ASPECTS is 10.   CTA H&N - Approximate 8-9 mm severe near occlusive stenosis of the proximal right M1 segment. Right MCA branches attenuated but patent distally with no visible downstream occlusion. Additional severe proximal right A1 stenosis. ICA stenosis up to 50% on the right and 45% on the left. Atheromatous change about the right carotid siphon with associated moderate to severe multifocal stenoses.  MRI head - Patchy and hazy diffusion abnormality involving the right insular cortex, subinsular white matter, and overlying right frontal operculum, consistent with acute right MCA territory ischemia. No associated hemorrhage or mass effect. Finding is consistent with the previously identified severe right M1 stenosis.   CT head repeat 24h of tPA - pending  2D Echo - pending  Consider 30 day cardiac event monitoring as outpt to rule out afib  [employer] Virus 2 - negative  LDL - 11  HgbA1c - 6.0  UDS neg  VTE prophylaxis - SCDs  aspirin 81 mg daily and  clopidogrel 75 mg daily (Plavix held 5 days for colonoscopy) prior to admission, now on No antithrombotic within 24h s/p tPA  Patient counseled to be compliant with his antithrombotic medications  Ongoing aggressive stroke risk factor management  Therapy recommendations:  pending  Disposition:  Pending  CAD s/p stent Elevated troponin  Troponin 35->50->215>209  EKG no ST-T change  TTE pending  Cardiology on board  On DAPT PTA  Will restart DAPT 24h post tPA if no bleeding  Hypertension  Home BP meds: Cozaar ; amlodipine ; HCTZ ; Coreg ; hydralazine ; Cardura  Current BP meds: resumed coreg  On cleviprex - taper off as able  Stable . BP < 180/105 post tPA . Long-term BP goal normotensive  Hyperlipidemia  Home Lipid lowering medication: Lipitor 80 mg daily  LDL 11, goal < 70  Current lipid lowering medication: decrease to lipitor 40   Continue statin at discharge  Diabetes  Home diabetic meds: insulin  Current diabetic meds: SSI   HgbA1c 6.0, goal < 7.0  Controlled  CBG monitoring  Follow up with PCP  Other Stroke Risk Factors  Advanced age  Former cigarette smoker - quit  ETOH use, advised to drink no more than 1 alcoholic beverage per day.  Hx stroke/TIA  Coronary artery disease  Dysphagia   Speech on board  Passed MBS  On dys3 and thin  Gentle hydration  Other Active Problems  Code status - DNR   Dementia on Aricept and nemanda - continue   Hx of multiple syncopal episodes CKD - stage 3a - Cre 1.30 Anemia of chronic disease  Hospital day # 0  This patient is critically ill due to right MCA infarct, right M1 occlusion, CAD with elevated troponin, dysphagia, s/p tPA and at significant risk of neurological worsening, death form recurrent stroke, hemorrhage post tPA, MI, heart failure, aspiration. This patient's care requires constant monitoring of vital signs, hemodynamics, respiratory and cardiac monitoring, review of  multiple databases, neurological assessment, discussion with family, other specialists and medical decision making of high complexity. I spent 40 minutes of neurocritical care time in the care of this patient.  Rosalin Hawking, MD PhD Stroke Neurology 12/31/2019 8:55 PM    To contact Stroke Continuity provider, please refer to [email or web address] After hours, contact General Neurology

## 2019-12-31 NOTE — ED Triage Notes (Signed)
Pt arrives to ED BIB [facility] as a Code Stroke. Per EMS pt was at home with family when family noticed around 2100(LKW) pt began to have Lf sided Facial Droop and weakness. Neurologist at bridge upon pt's arrival.

## 2019-12-31 NOTE — Code Documentation (Signed)
Was down escorting another patient to IR when this patient arrived via EMS  Code Stroke never paged out but Dr Rory Percy present when patient arrived.  LKW 2100  Was seen at [facility] for dehydration previously today and around 2100 had left sided weakness and slurred speech.   CTA +occlusion and MRI + Cleviprex and Labetalol given for BP control  Decision for tPA 0009 started at [place] initially 6  Assisted with [facility] and VS during tPA and helped with transport to ICU

## 2019-12-31 NOTE — Evaluation (Signed)
Physical Therapy Evaluation Patient Details Name: Peter [name] MRN: [id number] DOB: March 13, 1930 Today's Date: 12/31/2019   History of Present Illness  Pt is an 86 yo male presenting with acute onset of fluctuating slurred speech, L sided weakness, and difficulty swallowing s/p tPA. MRI concerning for R MCA territory ischemia. CTA revealed a small amount of secretions within the proximal R mainstem bronchus, suggestive of aspiration risk. PMH includes: prostate ca, PAD, MI, mild dementia, HTN, HLD, gout, GERD, DM, CAD, CKD   Clinical Impression  Pt admitted with/for stroke described above.  Pt needing minimal assist overall. At present pt does not have a safe amount of assist at home for optimal safety and should entertain initial 24/7 assist.  Pt currently limited functionally due to the problems listed below.  (see problems list.)  Pt will benefit from PT to maximize function and safety to be able to get home safely with available assist.     Follow Up Recommendations SNF;Other (comment) (pt refused a facility, dtr states can add assist as needed.  --HHPT)    Equipment Recommendations  None recommended by PT    Recommendations for Other Services       Precautions / Restrictions Precautions Precautions: Fall Restrictions Weight Bearing Restrictions: No      Mobility  Bed Mobility Overal bed mobility: Needs Assistance Bed Mobility: Supine to Sit     Supine to sit: Min guard     General bed mobility comments: pt with use of the rail (which he has) for slow rise via L elbow without assist    Transfers Overall transfer level: Needs assistance   Transfers: Sit to/from Stand Sit to Stand: Min assist (except mod from low toilet)         General transfer comment: assist for forward, but more boost  Ambulation/Gait Ambulation/Gait assistance: Min assist Gait Distance (Feet): 40 Feet Assistive device: Rolling walker (2 wheeled) Gait Pattern/deviations: Step-through  pattern Gait velocity: slower Gait velocity interpretation: <1.8 ft/sec, indicate of risk for recurrent falls General Gait Details: short step/stride length, mild incoordination on left with narrowed BOS.   Stairs            Wheelchair Mobility    Modified Rankin (Stroke Patients Only) Modified Rankin (Stroke Patients Only) Pre-Morbid Rankin Score: No significant disability Modified Rankin: Moderately severe disability     Balance Overall balance assessment: Needs assistance Sitting-balance support: No upper extremity supported;Feet supported   Sitting balance - Comments: no support needed   Standing balance support: Single extremity supported;During functional activity Standing balance-Leahy Scale: Poor Standing balance comment: reliant on external support or AD                             Pertinent Vitals/Pain Pain Assessment: Faces Faces Pain Scale: No hurt Pain Intervention(s): Monitored during session    Home Living Family/patient expects to be discharged to:: Private residence Living Arrangements: Spouse/significant other Available Help at Discharge: Family;Personal care attendant;Other (Comment);Available PRN/intermittently (2 separate PCA 7 days/week from ~12-6 pm) Type of Home: House Home Access: Stairs to enter Entrance Stairs-Rails: Right;Left;Can reach both Entrance Stairs-Number of Steps: 3 Home Layout: Two level;Able to live on main level with bedroom/bathroom;Other (Comment) ("man cave" upstairs) Home Equipment: Environmental consultant - 2 wheels;Wheelchair - manual;Shower seat - built in;Bedside commode;Walker - 4 wheels      Prior Function Level of Independence: Needs assistance   Gait / Transfers Assistance Needed: Pt reports ambulating with RW or using  a WC at baseline depending on needs that day.  ADL's / Homemaking Assistance Needed: States he is generally requires at least some assist for ADL management. Has PCA 7 days/week that assists with house  cleaning, medication management.  Comments: Pt ambulating with RW or using w/c at baseline. Reports he needed some assist PTA but wife is not in great physical health either. Daughter can assist some.     Hand Dominance   Dominant Hand: Right    Extremity/Trunk Assessment   Upper Extremity Assessment Upper Extremity Assessment: LUE deficits/detail;Defer to OT evaluation (L UE incoordination)    Lower Extremity Assessment Lower Extremity Assessment: Overall WFL for tasks assessed;LLE deficits/detail;RLE deficits/detail RLE Deficits / Details: geneeral weakness, pt relates acute on chronic in nature LLE Deficits / Details: mild general weakness LLE Coordination: decreased fine motor    Cervical / Trunk Assessment Cervical / Trunk Assessment: Kyphotic (cervical forward head)  Communication   Communication: No difficulties  Cognition Arousal/Alertness: Awake/alert Behavior During Therapy: WFL for tasks assessed/performed Overall Cognitive Status: No family/caregiver present to determine baseline cognitive functioning                                 General Comments: noted some inattension to L side and slowed processing, but followed direction and oriented.      General Comments General comments (skin integrity, edema, etc.): vss    Exercises     Assessment/Plan    PT Assessment Patient needs continued PT services  PT Problem List Decreased strength;Decreased activity tolerance;Decreased balance;Decreased mobility;Decreased coordination;Decreased knowledge of use of DME       PT Treatment Interventions DME instruction;Gait training;Stair training;Functional mobility training;Therapeutic activities;Neuromuscular re-education;Balance training;Patient/family education    PT Goals (Current goals can be found in the Care Plan section)  Acute Rehab PT Goals Patient Stated Goal: get better, go home PT Goal Formulation: With patient Time For Goal Achievement:  01/14/20 Potential to Achieve Goals: Good    Frequency Min 3X/week   Barriers to discharge        Co-evaluation               AM-PAC PT "6 Clicks" Mobility  Outcome Measure Help needed turning from your back to your side while in a flat bed without using bedrails?: A Little Help needed moving from lying on your back to sitting on the side of a flat bed without using bedrails?: A Little Help needed moving to and from a bed to a chair (including a wheelchair)?: A Little Help needed standing up from a chair using your arms (e.g., wheelchair or bedside chair)?: A Little Help needed to walk in hospital room?: A Little Help needed climbing 3-5 steps with a railing? : A Little 6 Click Score: 18    End of Session   Activity Tolerance: Patient tolerated treatment well Patient left: in chair;with call bell/phone within reach;with chair alarm set Nurse Communication: Mobility status PT Visit Diagnosis: Unsteadiness on feet (R26.81);Other symptoms and signs involving the nervous system (R29.898)    Time: 2778-2423 PT Time Calculation (min) (ACUTE ONLY): 38 min   Charges:   PT Evaluation $PT Eval Moderate Complexity: 1 Mod PT Treatments $Gait Training: 8-22 mins $Therapeutic Activity: 8-22 mins        12/31/2019  Ginger Carne., PT Acute Rehabilitation Services [phone]  (pager) [phone]  (office)  Tessie Fass Vung Kush 12/31/2019, 10:15 PM

## 2019-12-31 NOTE — Progress Notes (Addendum)
Progress Note  Patient Name: Peter [name] Date of Encounter: 12/31/2019  Primary Cardiologist: [name], MD   Subjective   Patient is alert and oriented, resting comfortably in bed. He denies any acute complaints at this time.   Inpatient Medications    Scheduled Meds:  Chlorhexidine Gluconate Cloth  6 each Topical Daily   insulin aspart  0-9 Units Subcutaneous TID WC   labetalol  20 mg Intravenous Once   pantoprazole (PROTONIX) IV  40 mg Intravenous QHS   Continuous Infusions:  sodium chloride     sodium chloride Stopped (12/31/19 0330)   clevidipine 8 mg/hr (12/31/19 0639)   PRN Meds: acetaminophen **OR** acetaminophen (TYLENOL) oral liquid 160 mg/5 mL **OR** acetaminophen, senna-docusate   Vital Signs    Vitals:   12/31/19 0600 12/31/19 0615 12/31/19 0630 12/31/19 0700  BP: (!) 131/49 (!) 139/52 (!) 137/50 (!) 139/47  Pulse: 68 74 71 72  Resp: 14 15 14 16   Temp:      TempSrc:      SpO2: 100% 100% 100% 100%    Intake/Output Summary (Last 24 hours) at 12/31/2019 0705 Last data filed at 12/31/2019 0500 Gross per 24 hour  Intake 69.9 ml  Output --  Net 69.9 ml   There were no vitals filed for this visit.  Telemetry    Sinus rhythm - Personally Reviewed  ECG    Not performed today - Personally Reviewed  Physical Exam  Physical Exam Constitutional:      Appearance: Normal appearance.  HENT:     Head: Normocephalic and atraumatic.     Mouth/Throat:     Mouth: Mucous membranes are dry.  Cardiovascular:     Rate and Rhythm: Normal rate and regular rhythm.     Pulses: Normal pulses.     Heart sounds: Normal heart sounds.  Pulmonary:     Effort: Pulmonary effort is normal.     Breath sounds: Normal breath sounds.  Abdominal:     General: There is no distension.     Palpations: Abdomen is soft.  Musculoskeletal:        General: No swelling. Normal range of motion.     Cervical back: Normal range of motion.  Skin:    General: Skin is  warm and dry.  Neurological:     General: No focal deficit present.     Mental Status: He is alert.      Labs    Chemistry Recent Labs  Lab 12/30/19 1210 12/30/19 2315 12/30/19 2324  NA 143 138 141  K 3.8 3.6 3.6  CL 108 106 103  CO2 21* 23  --   GLUCOSE 130* 221* 203*  BUN 18 18 18   CREATININE 1.24 1.47* 1.30*  CALCIUM 9.1 8.0*  --   PROT  --  5.0*  --   ALBUMIN  --  2.6*  --   AST  --  22  --   ALT  --  24  --   ALKPHOS  --  70  --   BILITOT  --  0.3  --   GFRNONAA 56* 46*  --   ANIONGAP 14 9  --      Hematology Recent Labs  Lab 12/30/19 1210 12/30/19 2315 12/30/19 2324  WBC 7.8 7.0  --   RBC 4.30 3.27*  --   HGB 11.2* 8.7* 9.5*  HCT 35.8* 27.9* 28.0*  MCV 83.3 85.3  --   MCH 26.0 26.6  --   MCHC 31.3  31.2  --   RDW 17.8* 17.6*  --   PLT 308 235  --     Cardiac EnzymesNo results for input(s): TROPONINI in the last 168 hours. No results for input(s): TROPIPOC in the last 168 hours.   BNPNo results for input(s): BNP, PROBNP in the last 168 hours.   DDimer No results for input(s): DDIMER in the last 168 hours.   Radiology    CT Code Stroke CTA Head W/WO contrast  Result Date: 12/31/2019 CLINICAL DATA:  Initial evaluation for acute stroke, left-sided deficits. EXAM: CT ANGIOGRAPHY HEAD AND NECK TECHNIQUE: Multidetector CT imaging of the head and neck was performed using the standard protocol during bolus administration of intravenous contrast. Multiplanar CT image reconstructions and MIPs were obtained to evaluate the vascular anatomy. Carotid stenosis measurements (when applicable) are obtained utilizing NASCET criteria, using the distal internal carotid diameter as the denominator. CONTRAST:  4mL OMNIPAQUE IOHEXOL 350 MG/ML SOLN COMPARISON:  Prior head CT from earlier same day. FINDINGS: CTA NECK FINDINGS Aortic arch: Visualized aortic arch normal caliber without aneurysm. Bovine branching pattern with common origin of the right great cephalic and left  common carotid artery noted. Mild-to-moderate atheromatous change noted within the proximal descending intrathoracic aorta. No hemodynamically significant stenosis about the origin the great vessels. Right carotid system: Right CCA patent from its origin to the bifurcation without stenosis. Scattered calcified plaque about the right bifurcation/proximal right ICA with associated stenosis of up to 50% by NASCET criteria. Right ICA patent distally to the skull base without stenosis, dissection or occlusion. Left carotid system: Left common carotid artery patent from its origin to the bifurcation without significant stenosis. Multifocal mixed plaque about the left bifurcation/proximal left ICA with estimated 45% stenosis by NASCET criteria. Left ICA mildly irregular but otherwise widely patent to the skull base without additional stenosis, dissection or occlusion. Vertebral arteries: Both vertebral arteries arise from the subclavian arteries. No proximal subclavian artery stenosis. Atheromatous change at the origins of the vertebral arteries with no more than mild stenosis on the right. Vertebral arteries otherwise patent without stenosis, dissection or occlusion. Skeleton: No visible acute osseous abnormality. No discrete or worrisome osseous lesions. Moderate multilevel cervical spondylosis. Other neck: No other acute soft tissue abnormality within the neck. No mass or adenopathy. Upper chest: Small amount of secretions noted within the proximal right mainstem bronchus, suggesting that this patient is at risk for aspiration. Visualized upper chest demonstrates no other acute finding. Review of the MIP images confirms the above findings CTA HEAD FINDINGS Anterior circulation: Extensive atheromatous change throughout the petrous, cavernous, and supraclinoid right ICA. Associated severe stenosis at the anterior genu of the cavernous segment. Additional scattered moderate multifocal narrowing elsewhere. On the left,  scattered atheromatous change throughout the left carotid siphon with no more than mild to moderate multifocal narrowing. Left A1 segment widely patent. Right A1 hypoplastic with a severe proximal stenosis (series 7, image 102). Normal anterior communicating artery complex. Anterior cerebral arteries patent to their distal aspects without stenosis. Long segment severe near occlusive stenosis of the proximal right M1 segment measuring approximately 8-9 mm in length (series 7, image 104). Distal right MCA branches patent but attenuated distally. Left M1 widely patent. Normal left MCA bifurcation. Distal left MCA branches well perfused. Posterior circulation: Atheromatous change about the V4 segments bilaterally with associated moderate short-segment stenoses, right worse than left. Left vertebral artery dominant. Both PICAs patent. Basilar patent to its distal aspect without stenosis. Superior cerebral arteries patent bilaterally. Right  PCA supplied via the basilar. 2 mm conical shaped focal outpouching arising at the junction of the right P1 and P2 segment favored to reflect a vascular infundibulum related to an occluded hypoplastic right PCOM. Fetal type origin of the left PCA. Both PCAs remain well perfused to their distal aspects. Venous sinuses: Grossly patent allowing for timing the contrast bolus. Anatomic variants: Hypoplastic right A1 segment. Dominant left vertebral artery. Fetal type origin of the left PCA. Review of the MIP images confirms the above findings IMPRESSION: 1. Negative CTA for emergent large vessel occlusion. 2. Approximate 8-9 mm severe near occlusive stenosis of the proximal right M1 segment. Right MCA branches attenuated but patent distally with no visible downstream occlusion. 3. Additional severe proximal right A1 stenosis. 4. Atheromatous change about the carotid bifurcations with associated stenoses of up to 50% on the right and 45% on the left. 5. Atheromatous change about the right  carotid siphon with associated moderate to severe multifocal stenoses. 6. Small amount of secretions within the proximal right mainstem bronchus, suggesting that this patient is at risk for aspiration. Critical Value/emergent results were discussed by telephone at the time of interpretation on 12/30/2019 at 11:40 p.m. to provider [facility] , who verbally acknowledged these results. Electronically Signed   By: Jeannine Boga M.D.   On: 12/31/2019 00:17   DG Chest 2 View  Result Date: 12/30/2019 CLINICAL DATA:  Chest pain. EXAM: CHEST - 2 VIEW COMPARISON:  06/27/2019. FINDINGS: Cardiac monitor device noted in stable position. Stable cardiomegaly. No pulmonary venous congestion. Low lung volumes. No focal infiltrate. No pleural effusion or pneumothorax. Degenerative change thoracic spine. IMPRESSION: 1. Stable cardiomegaly. No pulmonary venous congestion. 2. Low lung volumes. Electronically Signed   By: Marcello Moores  Register   On: 12/30/2019 12:38   CT Code Stroke CTA Neck W/WO contrast  Result Date: 12/31/2019 CLINICAL DATA:  Initial evaluation for acute stroke, left-sided deficits. EXAM: CT ANGIOGRAPHY HEAD AND NECK TECHNIQUE: Multidetector CT imaging of the head and neck was performed using the standard protocol during bolus administration of intravenous contrast. Multiplanar CT image reconstructions and MIPs were obtained to evaluate the vascular anatomy. Carotid stenosis measurements (when applicable) are obtained utilizing NASCET criteria, using the distal internal carotid diameter as the denominator. CONTRAST:  16mL OMNIPAQUE IOHEXOL 350 MG/ML SOLN COMPARISON:  Prior head CT from earlier same day. FINDINGS: CTA NECK FINDINGS Aortic arch: Visualized aortic arch normal caliber without aneurysm. Bovine branching pattern with common origin of the right great cephalic and left common carotid artery noted. Mild-to-moderate atheromatous change noted within the proximal descending intrathoracic aorta. No  hemodynamically significant stenosis about the origin the great vessels. Right carotid system: Right CCA patent from its origin to the bifurcation without stenosis. Scattered calcified plaque about the right bifurcation/proximal right ICA with associated stenosis of up to 50% by NASCET criteria. Right ICA patent distally to the skull base without stenosis, dissection or occlusion. Left carotid system: Left common carotid artery patent from its origin to the bifurcation without significant stenosis. Multifocal mixed plaque about the left bifurcation/proximal left ICA with estimated 45% stenosis by NASCET criteria. Left ICA mildly irregular but otherwise widely patent to the skull base without additional stenosis, dissection or occlusion. Vertebral arteries: Both vertebral arteries arise from the subclavian arteries. No proximal subclavian artery stenosis. Atheromatous change at the origins of the vertebral arteries with no more than mild stenosis on the right. Vertebral arteries otherwise patent without stenosis, dissection or occlusion. Skeleton: No visible acute osseous abnormality.  No discrete or worrisome osseous lesions. Moderate multilevel cervical spondylosis. Other neck: No other acute soft tissue abnormality within the neck. No mass or adenopathy. Upper chest: Small amount of secretions noted within the proximal right mainstem bronchus, suggesting that this patient is at risk for aspiration. Visualized upper chest demonstrates no other acute finding. Review of the MIP images confirms the above findings CTA HEAD FINDINGS Anterior circulation: Extensive atheromatous change throughout the petrous, cavernous, and supraclinoid right ICA. Associated severe stenosis at the anterior genu of the cavernous segment. Additional scattered moderate multifocal narrowing elsewhere. On the left, scattered atheromatous change throughout the left carotid siphon with no more than mild to moderate multifocal narrowing. Left A1  segment widely patent. Right A1 hypoplastic with a severe proximal stenosis (series 7, image 102). Normal anterior communicating artery complex. Anterior cerebral arteries patent to their distal aspects without stenosis. Long segment severe near occlusive stenosis of the proximal right M1 segment measuring approximately 8-9 mm in length (series 7, image 104). Distal right MCA branches patent but attenuated distally. Left M1 widely patent. Normal left MCA bifurcation. Distal left MCA branches well perfused. Posterior circulation: Atheromatous change about the V4 segments bilaterally with associated moderate short-segment stenoses, right worse than left. Left vertebral artery dominant. Both PICAs patent. Basilar patent to its distal aspect without stenosis. Superior cerebral arteries patent bilaterally. Right PCA supplied via the basilar. 2 mm conical shaped focal outpouching arising at the junction of the right P1 and P2 segment favored to reflect a vascular infundibulum related to an occluded hypoplastic right PCOM. Fetal type origin of the left PCA. Both PCAs remain well perfused to their distal aspects. Venous sinuses: Grossly patent allowing for timing the contrast bolus. Anatomic variants: Hypoplastic right A1 segment. Dominant left vertebral artery. Fetal type origin of the left PCA. Review of the MIP images confirms the above findings IMPRESSION: 1. Negative CTA for emergent large vessel occlusion. 2. Approximate 8-9 mm severe near occlusive stenosis of the proximal right M1 segment. Right MCA branches attenuated but patent distally with no visible downstream occlusion. 3. Additional severe proximal right A1 stenosis. 4. Atheromatous change about the carotid bifurcations with associated stenoses of up to 50% on the right and 45% on the left. 5. Atheromatous change about the right carotid siphon with associated moderate to severe multifocal stenoses. 6. Small amount of secretions within the proximal right  mainstem bronchus, suggesting that this patient is at risk for aspiration. Critical Value/emergent results were discussed by telephone at the time of interpretation on 12/30/2019 at 11:40 p.m. to provider [facility] , who verbally acknowledged these results. Electronically Signed   By: Jeannine Boga M.D.   On: 12/31/2019 00:17   MR BRAIN WO CONTRAST  Result Date: 12/31/2019 CLINICAL DATA:  Initial evaluation for acute stroke, left-sided deficits. EXAM: MRI HEAD WITHOUT CONTRAST TECHNIQUE: Multiplanar, multiecho pulse sequences of the brain and surrounding structures were obtained without intravenous contrast. COMPARISON:  Prior CTs from 12/30/2019. FINDINGS: Brain: Limited stroke protocol MRI was performed. Age-related cerebral atrophy. Patchy T2/FLAIR hyperintensity within the periventricular white matter most consistent with chronic small vessel ischemic disease, mild for age. Patchy and hazy diffusion abnormality seen involving the right insular cortex and overlying right frontal operculum, consistent with acute right MCA territory ischemia. Patchy involvement of the adjacent subinsular white matter. Finding likely related to the previously identified severe right M1 stenosis. No associated hemorrhage or mass effect. No other evidence for acute or subacute ischemia. Gray-white matter differentiation otherwise maintained. No  other areas of encephalomalacia to suggest chronic cortical infarction. No foci of susceptibility artifact to suggest acute or chronic intracranial hemorrhage. No mass lesion, midline shift or mass effect. No hydrocephalus or extra-axial fluid collection. Vascular: Major intracranial vascular flow voids are grossly maintained skull base. Skull and upper cervical spine: Grossly unremarkable on this limited exam. Sinuses/Orbits: Globes and orbital soft tissues demonstrate no acute finding. Paranasal sinuses are grossly clear. Trace right mastoid effusion noted. Other: None.  IMPRESSION: 1. Patchy and hazy diffusion abnormality involving the right insular cortex, subinsular white matter, and overlying right frontal operculum, consistent with acute right MCA territory ischemia. No associated hemorrhage or mass effect. Finding is consistent with the previously identified severe right M1 stenosis. 2. No other acute intracranial abnormality. 3. Age-related cerebral atrophy with mild chronic small vessel ischemic disease. Electronically Signed   By: Jeannine Boga M.D.   On: 12/31/2019 00:37   CT HEAD CODE STROKE WO CONTRAST  Result Date: 12/30/2019 CLINICAL DATA:  Code stroke. Initial evaluation for acute left-sided weakness. EXAM: CT HEAD WITHOUT CONTRAST TECHNIQUE: Contiguous axial images were obtained from the base of the skull through the vertex without intravenous contrast. COMPARISON:  None. FINDINGS: Brain: Age-related cerebral atrophy with mild chronic small vessel ischemic disease. No acute intracranial hemorrhage. No definite acute large vessel territory infarct. No mass lesion, mass effect, or midline shift. No hydrocephalus or extra-axial fluid collection. Vascular: No hyperdense vessel. Calcified atherosclerosis present at skull base. Skull: Scalp soft tissues within normal limits.  Calvarium intact. Sinuses/Orbits: Globes and orbital soft tissues within normal limits. Visualized paranasal sinuses are clear. Small chronic appearing right mastoid effusion. Other: None. ASPECTS [facility] Stroke Program Early CT Score) - Ganglionic level infarction (caudate, lentiform nuclei, internal capsule, insula, M1-M3 cortex): 7 - Supraganglionic infarction (M4-M6 cortex): 3 Total score (0-10 with 10 being normal): 10 IMPRESSION: 1. No acute intracranial infarct or other abnormality. 2. ASPECTS is 10. 3. Age-appropriate cerebral atrophy with mild chronic small vessel ischemic disease. These results were communicated to Dr. Rory Percy at 11:39 pmon 11/18/2021by text page via the [facility]  messaging system. Electronically Signed   By: Jeannine Boga M.D.   On: 12/30/2019 23:46    Cardiac Studies   Echo pending  Patient Profile     86 year old male with history of peripheral arterial disease, CAD s/p pLAD atherectomy and PCI 09/2798, diastolic dysfunction, hypertension, diabetes who presented with acute CVA now status-post TPA on whom we are consulted regarding troponin elevation  Assessment & Plan    1. Elevated Troponin: Patient presented with acute CVA with elevated troponin 35>50>215>209. EKG without evidence of ischemia and echo pending. He continues to deny any chest pain. Patient's risk factors for ACS include previous CAD s/p DES, previous smoking history, HLD, HTN, and controlled DM. His elevated troponin is likely secondary to demand ischemia in the setting of significantly elevated BP and acute stroke. Although he does not require emergent invasive management at this time, we will get an echo to assess his heart function and reevaluate his need for further ischemic evaluation in the near future. - Echo pending   2. Acute CVA Patient presented with acute right, frontal MCA stroke s/p TPA and admitted to the ICU for strict blood pressure control with clevidipine gtt with good control of blood pressure. Defer management to neurology.   3. HTN Patient developed elevated blood pressures opf 202/79 and was started on Clevidipine gtt. His subsequent BP this morning is 139/47.   4. CKD (  IIIa) Patient has a history of CKD likely secondary to HTN and DM. His baseline Cr is 1.22 with a GFR of 53. He is at baseline at this time.   5. CAD s/p DES to LAD (06/2019): Patient had a left heart cath on 07/02/19 showing severe single vessel CAD with 80-90% stenosis of the proximal LAD. There was also mild-moderate mid/distal LAD and RCA/rPDA disease. He is no s/p IVUS guided orbital atherectomy and PCI/DES of the ostial to mid LAD/. He was started on DAPT for 12 months with ASA  and Plavix and atorvastatin 80 mg daily. His lipid panel during this hospitalization showed total of 85, TAG 152, LDL of 11. His A1c is well controlled at 6.0.  - DAPT was held due to TPA, patient will restart this medication after 24 hour CT is performed without evidence of ICH.  - Continue high intensity statin.  - Continue good blood pressure and diabetes control.   6. IDDM: Patient has a history of insulin dependant DM with his most recent A1c of 6.0 on Lantus 20 units qHS. His diabetes appears to be well controlled and his kidney function is not bad enough to affect the reliability of his A1c results.   For questions or updates, please contact [place] Please consult [email or web address] for contact info under Cardiology/STEMI.      Signed, Marianna Payment, MD  12/31/2019, 7:05 AM  Pager: [phone]   Patient examined chart reviewed discussed care with patient and resident Exam with vascular disease and previous right foot amputation Neuro status improved Euvolemic with no chest pain or acute ECG changes. No further inpatient cardiac w/u planned pending echo results  Jenkins Rouge MD [facility]

## 2019-12-31 NOTE — Progress Notes (Signed)
Pt passed yale swallow eval this morning. Noted to have some spillage from mouth when drinking liquid this afternoon, some coughing noted, and daughter expressed some concerns about his voice being hoarse at the bedside. Spoke with SLP Mickel Baas and both agreed that she would come do a speech eval on him with his cognitive exam. MD Rosalin Hawking made aware of meds being held until after speech eval is completed.

## 2019-12-31 NOTE — Progress Notes (Signed)
Patient belongings at bedside:  Jacket, pants, shoes, pair of socks, shirt, belt.

## 2019-12-31 NOTE — Progress Notes (Signed)
Pharmacist Code Stroke Response  Notified to mix tPA at 0016 by Dr. Rory Percy Delivered tPA to RN at 0026  tPA dose = 6.5 mg bolus over 1 minute followed by 58.8 mg for a total dose of 65.3 mg over 1 hour  Issues/delays encountered (if applicable): Elevated SBP required labetalol and clevidipine prior to alteplase initiation  Caryl Pina 12/31/19 12:43 AM

## 2019-12-31 NOTE — Progress Notes (Signed)
  Echocardiogram 2D Echocardiogram has been performed.  Peter [name] 12/31/2019, 10:20 AM

## 2019-12-31 NOTE — Consult Note (Signed)
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  None. FINDINGS: Brain: Age-related cerebral atrophy with mild chronic small vessel ischemic disease. No acute intracranial hemorrhage. No definite acute large vessel territory infarct. No mass lesion, mass effect, or midline shift. No hydrocephalus or extra-axial fluid collection. Vascular: No hyperdense vessel. Calcified atherosclerosis present at skull base. Skull: Scalp soft tissues within normal limits.  Calvarium intact. Sinuses/Orbits: Globes and orbital soft tissues within normal limits. Visualized paranasal sinuses are clear. Small chronic appearing right mastoid effusion. Other: None. ASPECTS [facility] Stroke Program Early CT Score) - Ganglionic level infarction (caudate, lentiform nuclei, internal capsule, insula, M1-M3 cortex): 7 - Supraganglionic infarction (M4-M6 cortex): 3 Total score (0-10 with 10 being normal): 10 IMPRESSION: 1. No acute intracranial infarct or other abnormality. 2. ASPECTS is 10. 3. Age-appropriate cerebral atrophy with mild chronic small vessel ischemic disease. These results were communicated to Dr. Rory Percy at 11:39  pmon 11/18/2021by text page via the [facility] messaging system. Electronically Signed   By: Jeannine Boga M.D.   On: 12/30/2019 23:46    ECG & Cardiac Imaging   07/05/19 orbital atherectomy and PCI of ostial to mid LAD w/ synergy 3.0 x 48 mm DES.  07/02/19 Severe single vessel coronary artery disease with 80-90% proximal LAD. Lesion is suboptimally visualized due to difficult catheter engagement but appears to be heavily calcified. Mild to moderate mid/distal LAD, LCx and RCA/rPDA disease.  5/16 TTE LVEF 60-65% without RWMA, moderate concentric LVH, G1DD, RV function normal, mild/moderate TR.  Assessment & Plan    86 year old male with history of peripheral arterial disease, CAD s/p pLAD atherectomy and PCI 08/6193, diastolic dysfunction, hypertension, diabetes who presented with acute CVA now status-post TPA on whom we are consulted regarding troponin elevation.  He is without chest pain and has modest troponin elevation in the setting of acute insular stroke with severe range hypertension and hypotension at time of EMS evaluation.  Suspect this represents supply demand mismatch associated with severe range hypertension as well as acute CVA.  No need to modify antithrombotic approach and recommend continued plan per post-TPA protocol.  We can risk stratify with echocardiogram in the morning but, in the absence of symptoms, no plan for an invasive approach.  Recommend aggressive risk factor modification.  Problem list Acute R MCA CVA (insular) CAD s/p pLAD atherectomy & PCI (synergy) 06/2019 CKD 3a Troponin elevation T2DM Chronic anemia  Recommendations - Continue antithrombotics per post-TPA protocol - Prefer DAPT for at least one year from LAD intervention; resume when safe post-TPA - Obtain TTE - Continue atorvastatin 80 mg daily - Repeat lipids; add ezetemibe if LDL >70 mg/dL - trend troponin to peak  C. Albertha Ghee, MD Cardiology Fellow [facility]   Signed, Delight Hoh, MD 12/31/2019, 2:46 AM  For questions or updates, please contact   Please consult [email or web address] for contact info under Cardiology/STEMI.
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
## 2019-12-31 NOTE — Plan of Care (Signed)
  Problem: Coping: Goal: Will identify appropriate support needs Outcome: Progressing   Problem: Self-Care: Goal: Verbalization of feelings and concerns over difficulty with self-care will improve Outcome: Progressing Goal: Ability to communicate needs accurately will improve Outcome: Progressing   Problem: Nutrition: Goal: Dietary intake will improve Outcome: Progressing   Problem: Education: Goal: Knowledge of disease or condition will improve Outcome: Progressing Goal: Knowledge of secondary prevention will improve Outcome: Progressing Goal: Knowledge of patient specific risk factors addressed and post discharge goals established will improve Outcome: Progressing   Problem: Coping: Goal: Will identify appropriate support needs Outcome: Progressing   Problem: Health Behavior/Discharge Planning: Goal: Ability to manage health-related needs will improve Outcome: Progressing   Problem: Self-Care: Goal: Ability to participate in self-care as condition permits will improve Outcome: Progressing Goal: Verbalization of feelings and concerns over difficulty with self-care will improve Outcome: Progressing Goal: Ability to communicate needs accurately will improve Outcome: Progressing   Problem: Nutrition: Goal: Risk of aspiration will decrease Outcome: Progressing Goal: Dietary intake will improve Outcome: Progressing   Problem: Ischemic Stroke/TIA Tissue Perfusion: Goal: Complications of ischemic stroke/TIA will be minimized Outcome: Progressing

## 2020-01-01 ENCOUNTER — Inpatient Hospital Stay (HOSPITAL_COMMUNITY): Payer: Medicare Other

## 2020-01-01 DIAGNOSIS — I2510 Atherosclerotic heart disease of native coronary artery without angina pectoris: Secondary | ICD-10-CM

## 2020-01-01 DIAGNOSIS — I10 Essential (primary) hypertension: Secondary | ICD-10-CM | POA: Diagnosis not present

## 2020-01-01 LAB — CBC
HCT: 28.8 % — ABNORMAL LOW (ref 39.0–52.0)
Hemoglobin: 9.2 g/dL — ABNORMAL LOW (ref 13.0–17.0)
MCH: 26.7 pg (ref 26.0–34.0)
MCHC: 31.9 g/dL (ref 30.0–36.0)
MCV: 83.7 fL (ref 80.0–100.0)
Platelets: 270 10*3/uL (ref 150–400)
RBC: 3.44 MIL/uL — ABNORMAL LOW (ref 4.22–5.81)
RDW: 17.3 % — ABNORMAL HIGH (ref 11.5–15.5)
WBC: 8.5 10*3/uL (ref 4.0–10.5)
nRBC: 0 % (ref 0.0–0.2)

## 2020-01-01 LAB — GLUCOSE, CAPILLARY
Glucose-Capillary: 116 mg/dL — ABNORMAL HIGH (ref 70–99)
Glucose-Capillary: 256 mg/dL — ABNORMAL HIGH (ref 70–99)
Glucose-Capillary: 175 mg/dL — ABNORMAL HIGH (ref 70–99)
Glucose-Capillary: 202 mg/dL — ABNORMAL HIGH (ref 70–99)

## 2020-01-01 LAB — BASIC METABOLIC PANEL
Anion gap: 9 (ref 5–15)
BUN: 18 mg/dL (ref 8–23)
CO2: 21 mmol/L — ABNORMAL LOW (ref 22–32)
Calcium: 8 mg/dL — ABNORMAL LOW (ref 8.9–10.3)
Chloride: 109 mmol/L (ref 98–111)
Creatinine, Ser: 1.47 mg/dL — ABNORMAL HIGH (ref 0.61–1.24)
GFR, Estimated: 46 mL/min — ABNORMAL LOW (ref >60)
Glucose, Bld: 123 mg/dL — ABNORMAL HIGH (ref 70–99)
Potassium: 3.6 mmol/L (ref 3.5–5.1)
Sodium: 139 mmol/L (ref 135–145)

## 2020-01-01 IMAGING — CT CT HEAD W/O CM
3 of 4 series · 13 of 47 positions shown, 15 images · non-contrast
Comparison: Prior MRI from [DATE].

CLINICAL DATA: Follow-up examination 24 hour status post tPA.

EXAM:
CT HEAD WITHOUT CONTRAST
TECHNIQUE: Contiguous axial images were obtained from the base of the skull
through the vertex without intravenous contrast.

[Series 3: head without (person_name) · axial · non-contrast · 0.41mm/px · z∈[+1258,+1383]mm · 7 of 35 slices shown, 9 images]
[im 5/35  brain]
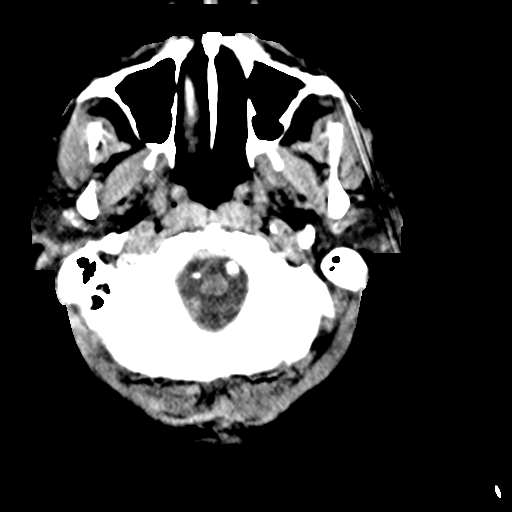
[im 5/35  bone]
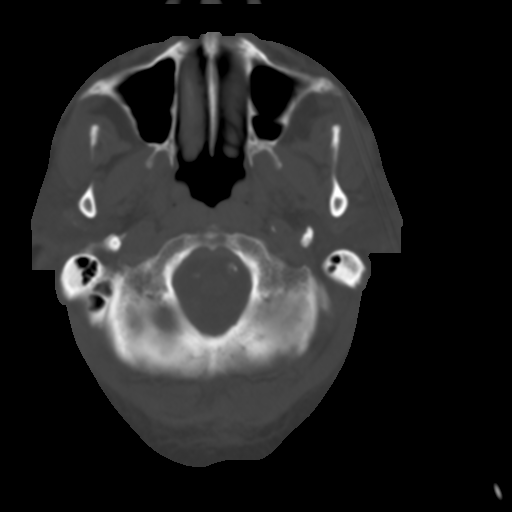
[im 9/35  brain]
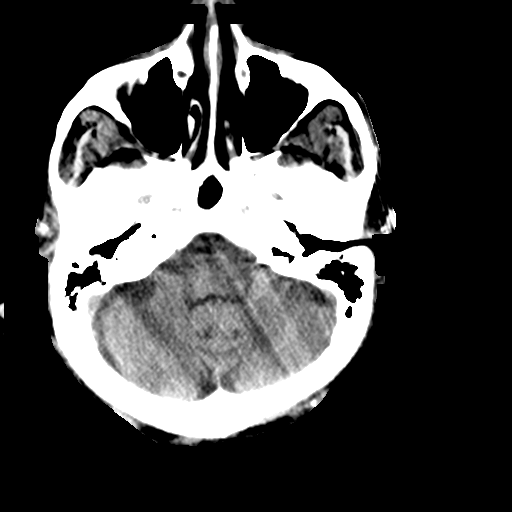
[im 13/35  brain]
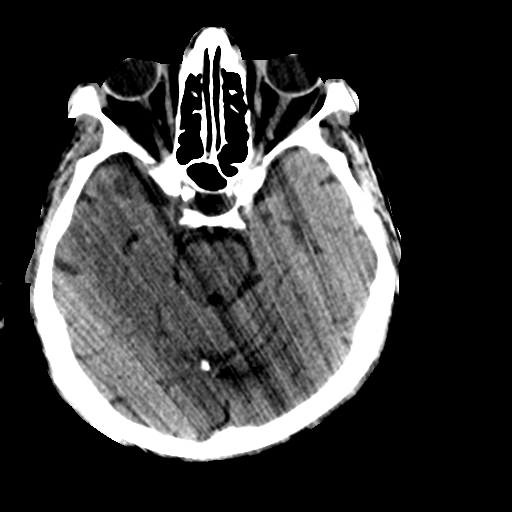
[im 18/35  brain]
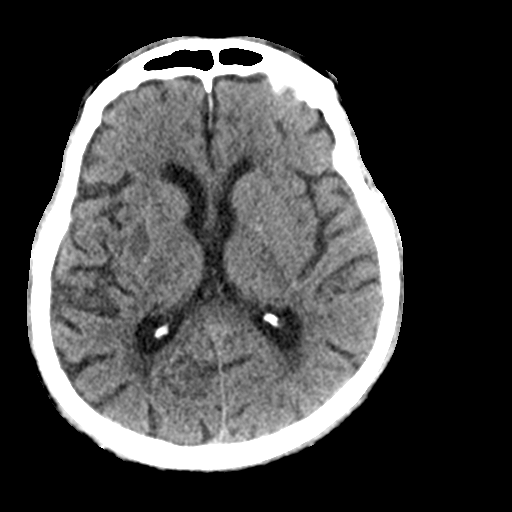
[im 22/35  brain]
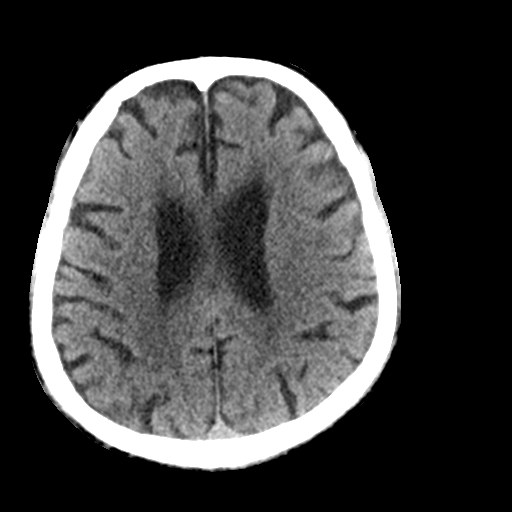
[im 22/35  bone]
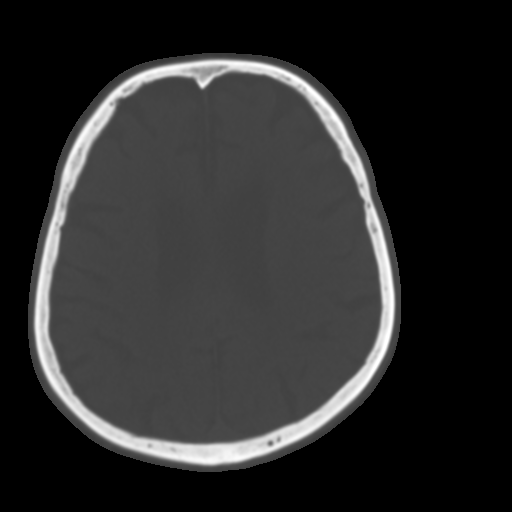
[im 26/35  brain]
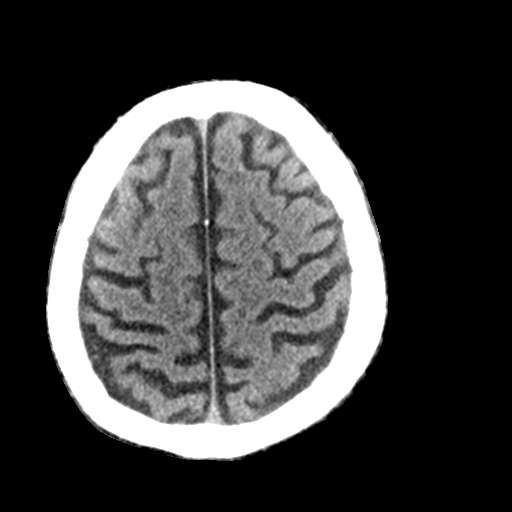
[im 30/35  brain]
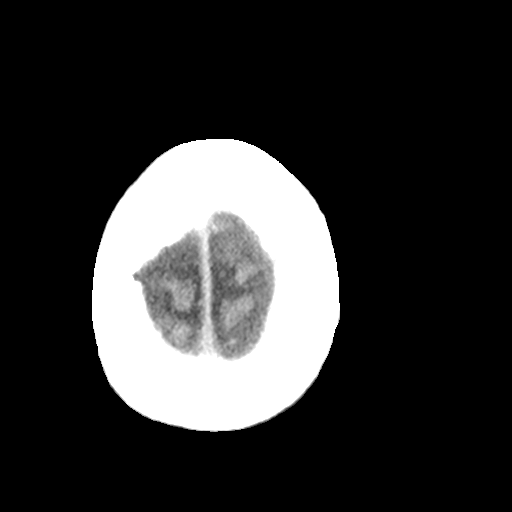

[Series 5: head without cor · coronal · non-contrast · 0.34mm/px · 3 of 71 slices shown]
[im 24/71  brain]
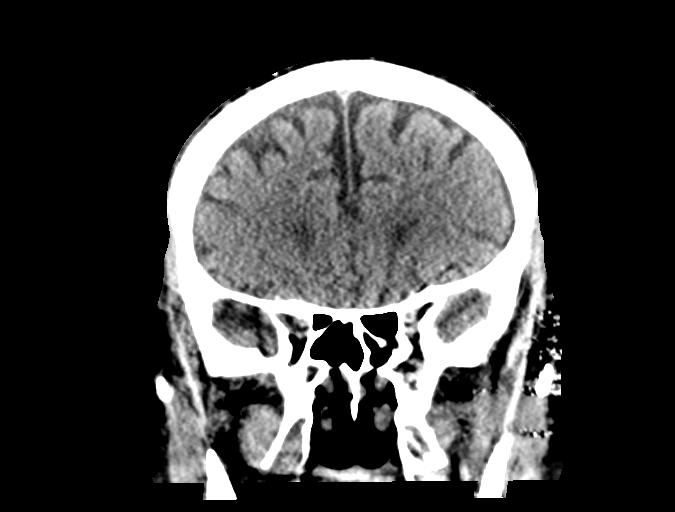
[im 32/71  brain]
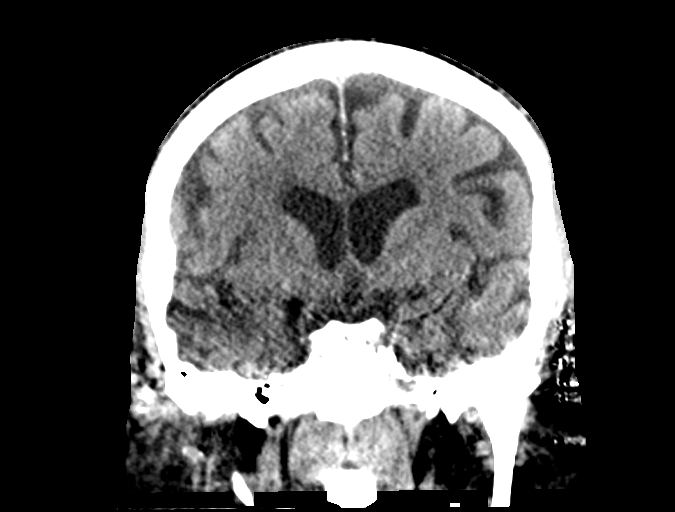
[im 39/71  brain]
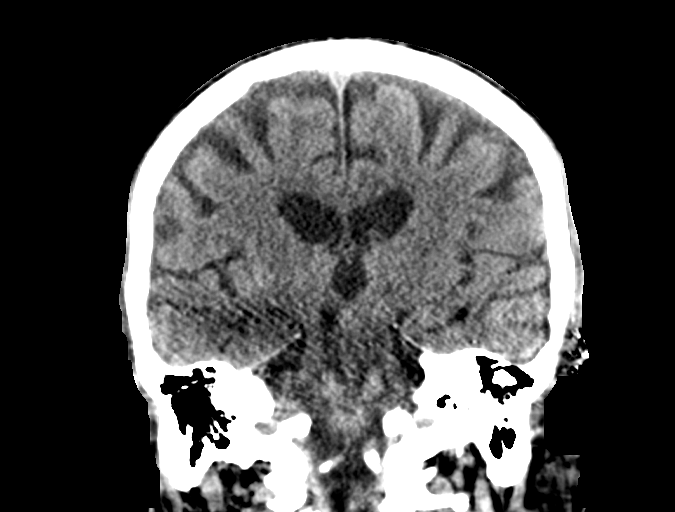

[Series 6: head without sag · sagittal · non-contrast · 0.34mm/px · 3 of 67 slices shown]
[im 23/67  brain]
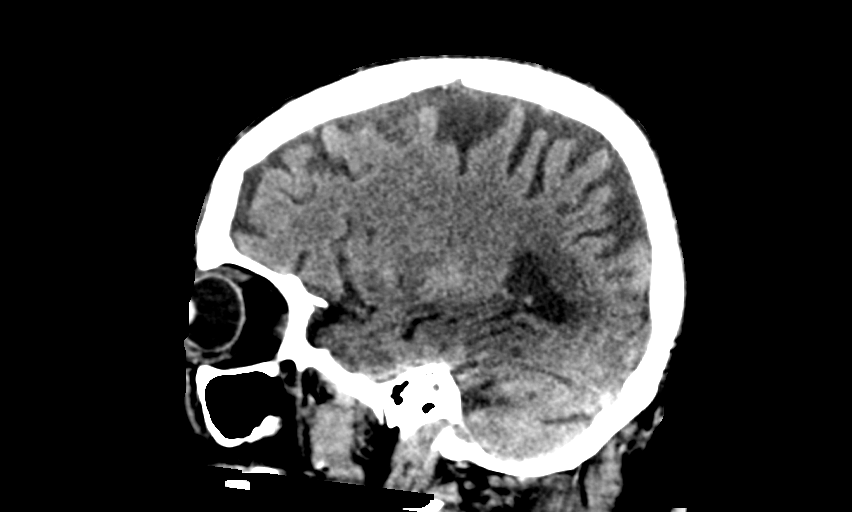
[im 34/67  brain]
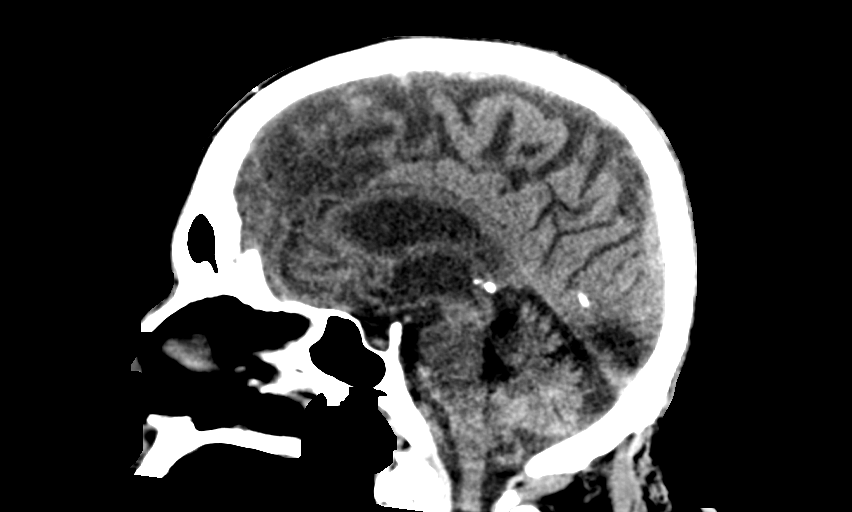
[im 45/67  brain]
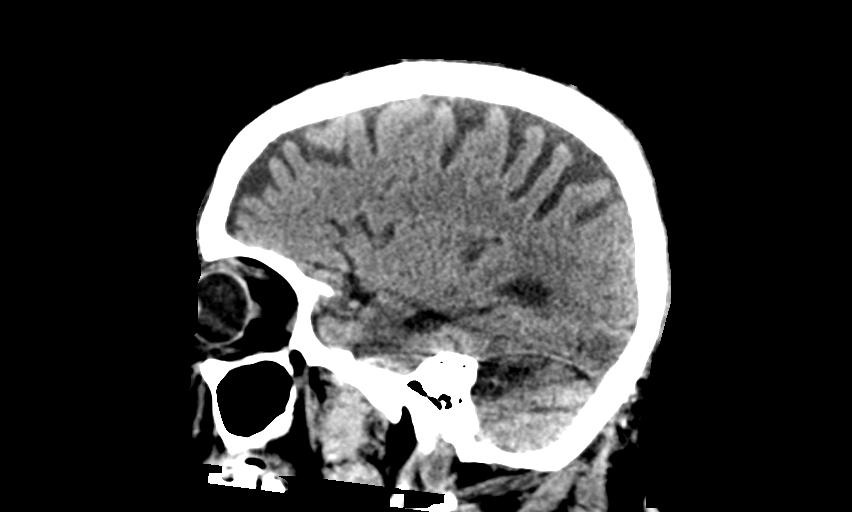

[13 of 47 positions shown; findings below may reference images not displayed]

FINDINGS: Brain: Examination technically limited by the hardening artifact
extending through the skull base.

Patchy evolving small volume acute ischemic infarcts involving the
subinsular white matter, adjacent insular cortex, and overlying
operculum noted on the right, corresponding with findings on prior
MRI. No interval hemorrhage, mass effect, or other complication.

No other evidence for acute or new large vessel territory infarct.
No intracranial hemorrhage elsewhere within the brain. Atrophy with
chronic microvascular ischemic disease again noted. No mass effect
or midline shift. No hydrocephalus or extra-axial fluid collection.

Vascular: No hyperdense vessel. Calcified atherosclerosis at the
skull base.

Skull: No new finding.

Sinuses/Orbits: Globes orbital soft tissues demonstrate no acute
finding. Paranasal sinuses are clear. Small right mastoid effusion
again noted.

Other: None.
IMPRESSION: 1. Patchy evolving small volume acute ischemic infarcts involving
the right subinsular white matter, adjacent insular cortex, and
overlying operculum, corresponding with findings on prior MRI. No
interval hemorrhage, mass effect, or other complication.
2. No other new acute intracranial abnormality.

## 2020-01-01 MED ORDER — NEPRO/CARBSTEADY PO LIQD
237.0000 mL | ORAL | Status: DC
  Administered 2020-01-02 – 2020-01-03 (×2): 237 mL via ORAL
  Filled 2020-01-01: qty 237

## 2020-01-01 MED ORDER — PROSIGHT PO TABS
1.0000 | ORAL_TABLET | Freq: Every day | ORAL | Status: DC
  Administered 2020-01-02 – 2020-01-04 (×3): 1 via ORAL
  Filled 2020-01-01 (×3): qty 1

## 2020-01-01 MED ORDER — RESOURCE THICKENUP CLEAR PO POWD
ORAL | Status: DC | PRN
  Filled 2020-01-01: qty 125

## 2020-01-01 MED ORDER — ASPIRIN EC 81 MG PO TBEC
81.0000 mg | DELAYED_RELEASE_TABLET | Freq: Every day | ORAL | Status: DC
  Administered 2020-01-01 – 2020-01-10 (×10): 81 mg via ORAL
  Filled 2020-01-01 (×10): qty 1

## 2020-01-01 MED ORDER — CLOPIDOGREL BISULFATE 75 MG PO TABS
75.0000 mg | ORAL_TABLET | Freq: Every day | ORAL | Status: DC
  Administered 2020-01-01 – 2020-01-03 (×3): 75 mg via ORAL
  Filled 2020-01-01 (×3): qty 1

## 2020-01-01 NOTE — Progress Notes (Signed)
STROKE TEAM PROGRESS NOTE   INTERVAL HISTORY Status post IV TPA.  Neurologically unchanged.  Blood pressures are still somewhat elevated.  He is requiring IV medications for this.  OBJECTIVE Vitals:   01/01/20 0321 01/01/20 0400 01/01/20 0500 01/01/20 0600  BP: (!) 176/54 (!) 162/51 (!) 137/106 (!) 184/54  Pulse: 80 77 71 82  Resp: 20 19 15 18   Temp:  98.9 F (37.2 C)    TempSrc:  Oral    SpO2: 98% 99% 98% 96%    CBC:  Recent Labs  Lab 12/30/19 1210 12/30/19 1210 12/30/19 2315 12/30/19 2324  WBC 7.8  --  7.0  --   NEUTROABS  --   --  4.9  --   HGB 11.2*   < > 8.7* 9.5*  HCT 35.8*   < > 27.9* 28.0*  MCV 83.3  --  85.3  --   PLT 308  --  235  --    < > = values in this interval not displayed.    Basic Metabolic Panel:  Recent Labs  Lab 12/30/19 1210 12/30/19 1210 12/30/19 2315 12/30/19 2324  NA 143   < > 138 141  K 3.8   < > 3.6 3.6  CL 108   < > 106 103  CO2 21*  --  23  --   GLUCOSE 130*   < > 221* 203*  BUN 18   < > 18 18  CREATININE 1.24   < > 1.47* 1.30*  CALCIUM 9.1  --  8.0*  --    < > = values in this interval not displayed.    Lipid Panel:     Component Value Date/Time   CHOL 85 12/31/2019 0318   TRIG 152 (H) 12/31/2019 0318   HDL 44 12/31/2019 0318   CHOLHDL 1.9 12/31/2019 0318   VLDL 30 12/31/2019 0318   LDLCALC 11 12/31/2019 0318   HgbA1c:  Lab Results  Component Value Date   HGBA1C 6.0 (H) 12/31/2019   Urine Drug Screen:     Component Value Date/Time   LABOPIA NONE DETECTED 12/31/2019 1806   COCAINSCRNUR NONE DETECTED 12/31/2019 1806   LABBENZ NONE DETECTED 12/31/2019 1806   AMPHETMU NONE DETECTED 12/31/2019 1806   THCU NONE DETECTED 12/31/2019 1806   LABBARB NONE DETECTED 12/31/2019 1806    Alcohol Level     Component Value Date/Time   ETH <10 12/30/2019 2315    IMAGING  CT Code Stroke CTA Head W/WO contrast CT Code Stroke CTA Neck W/WO contrast 12/31/2019 IMPRESSION:  1. Negative CTA for emergent large vessel  occlusion.  2. Approximate 8-9 mm severe near occlusive stenosis of the proximal right M1 segment. Right MCA branches attenuated but patent distally with no visible downstream occlusion.  3. Additional severe proximal right A1 stenosis.  4. Atheromatous change about the carotid bifurcations with associated stenoses of up to 50% on the right and 45% on the left.  5. Atheromatous change about the right carotid siphon with associated moderate to severe multifocal stenoses.  6. Small amount of secretions within the proximal right mainstem bronchus, suggesting that this patient is at risk for aspiration.   MR BRAIN WO CONTRAST 12/31/2019 IMPRESSION:  1. Patchy and hazy diffusion abnormality involving the right insular cortex, subinsular white matter, and overlying right frontal operculum, consistent with acute right MCA territory ischemia. No associated hemorrhage or mass effect. Finding is consistent with the previously identified severe right M1 stenosis.  2. No other acute intracranial abnormality.  3. Age-related cerebral atrophy with mild chronic small vessel ischemic disease.   CT HEAD CODE STROKE WO CONTRAST 12/30/2019 IMPRESSION:  1. No acute intracranial infarct or other abnormality.  2. ASPECTS is 10.  3. Age-appropriate cerebral atrophy with mild chronic small vessel ischemic disease.   CT HEAD WO CONTRAST - post tPA 01/01/20 IMPRESSION: 1. Patchy evolving small volume acute ischemic infarcts involving the right subinsular white matter, adjacent insular cortex, and overlying operculum, corresponding with findings on prior MRI. No interval hemorrhage, mass effect, or other complication. 2. No other new acute intracranial abnormality.  DG Chest 2 View 12/30/2019 IMPRESSION:  1. Stable cardiomegaly. No pulmonary venous congestion.  2. Low lung volumes.   Transthoracic Echocardiogram  1. Left ventricular ejection fraction, by estimation, is 60 to 65%. The  left ventricle has  normal function. The left ventricle has no regional  wall motion abnormalities. There is mild concentric left ventricular  hypertrophy. Left ventricular diastolic  parameters are consistent with Grade II diastolic dysfunction  (pseudonormalization). Elevated left atrial pressure. The E/e' is 83.  2. Right ventricular systolic function is normal. The right ventricular  size is normal. Tricuspid regurgitation signal is inadequate for assessing  PA pressure.  3. Left atrial size was mildly dilated.  4. The mitral valve is degenerative. No evidence of mitral valve  regurgitation. No evidence of mitral stenosis.  5. The aortic valve is tricuspid. Aortic valve regurgitation is not  visualized. No aortic stenosis is present.  6. The inferior vena cava is normal in size with greater than 50%  respiratory variability, suggesting right atrial pressure of 3 mmHg.  ECG - SR rate 70 BPM. (See cardiology reading for complete details)  PHYSICAL EXAM   Temp:  [97.5 F (36.4 C)-98.9 F (37.2 C)] 98.9 F (37.2 C) (11/20 0400) Pulse Rate:  [62-84] 82 (11/20 0600) Resp:  [10-28] 18 (11/20 0600) BP: (122-211)/(45-151) 184/54 (11/20 0600) SpO2:  [95 %-100 %] 96 % (11/20 0600)  General - Well nourished, well developed, in no apparent distress.  Ophthalmologic - fundi not visualized due to noncooperation.  Cardiovascular - Regular rhythm and rate.  Mental Status -  Level of arousal and orientation to time, place, and person were intact.  There is mild to moderate dysarthria noted. Language including expression, naming, repetition, comprehension was assessed and found intact.  Cranial Nerves II - XII - II - Visual field intact OU. III, IV, VI - Extraocular movements intact. V - Facial sensation intact bilaterally. VII - mild left facial droop. VIII - Hearing & vestibular intact bilaterally. X - Palate elevates symmetrically. XI - Chin turning & shoulder shrug intact bilaterally. XII -  Tongue protrusion intact.  Motor Strength - The patient's strength was normal in all extremities except mild left UE pronator drift.  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  There is marked global atrophy of the intrinsic hand muscles on the right and mild on the left.  These are associated with weakness of the hand muscles bilaterally. Motor Tone - Muscle tone was assessed at the neck and appendages and was normal.  Reflexes - The patient's reflexes were symmetrical in all extremities and he had no pathological reflexes.  Sensory - Light touch, temperature/pinprick were assessed and were symmetrical.    Coordination - The patient had normal movements in the hands with no ataxia or dysmetria.  Tremor was absent.  Gait and Station - deferred.   ASSESSMENT/PLAN [name] is a 86 y.o. male with history of coronary artery disease, CKD stage III, dementia, diabetes, diastolic dysfunction, peripheral vascular disease, prostate cancer, history of multiple syncopal episodes, presented to the emergency room for evaluation of sudden onset of slurred speech, difficulty swallowing and left-sided weakness. He was scheduled for a GI scoping today, but could not go ahead with the procedure because of severely elevated blood pressures with systolic in the 833A.  His Plavix was on hold for procedure.  The patient received IV t-PA Friday 12/31/19 at [facility].  Stroke: acute right MCA pachy infarcts with right M1 occlusion s/p tPA - cardioembolic vs. Large vessel disease source  CT Head - No acute intracranial infarct or other abnormality. ASPECTS is 10.   CTA H&N - Approximate 8-9 mm severe near occlusive stenosis of the proximal right M1 segment. Right MCA branches attenuated but patent distally with no visible downstream occlusion. Additional severe proximal right A1 stenosis.  ICA stenosis up to 50% on the right and 45% on the left. Atheromatous change about the right carotid siphon with associated moderate to severe multifocal stenoses.  MRI head - Patchy and hazy diffusion abnormality involving the right insular cortex, subinsular white matter, and overlying right frontal operculum, consistent with acute right MCA territory ischemia. No associated hemorrhage or mass effect. Finding is consistent with the previously identified severe right M1 stenosis.   CT head repeat 24h post tPA - Patchy evolving small volume acute ischemic infarcts involving the right subinsular white matter, adjacent insular cortex, and overlying operculum, corresponding with findings on prior MRI. No interval hemorrhage  2D Echo - EF 60 - 65%. No cardiac source of emboli identified.   Consider 30 day cardiac event monitoring as outpt to rule out afib  [employer] Virus 2 - negative  LDL - 11  HgbA1c - 6.0  UDS neg  VTE prophylaxis - SCDs  aspirin 81 mg daily and clopidogrel 75 mg daily (Plavix held 5 days for colonoscopy) prior to admission, now on No antithrombotic within 24h s/p tPA  Patient counseled to be compliant with his antithrombotic medications  Ongoing aggressive stroke risk factor management  Therapy recommendations:  [place] placement recommended by therapists  Disposition:  Pending  CAD s/p stent Elevated troponin  Troponin 35->50->215>209  EKG no ST-T change  TTE - EF 60 - 65%. No cardiac source of emboli identified.   Cardiology on board  On DAPT PTA  Will restart DAPT 24h post tPA if no bleeding  CT head repeat 24h post tPA - Patchy evolving small volume acute ischemic infarcts involving the right subinsular white matter, adjacent insular cortex, and overlying operculum, corresponding with findings on prior MRI. No interval hemorrhage  Will start DAPT  Hypertension  Home BP meds: Cozaar ; amlodipine ; HCTZ ; Coreg ; hydralazine ;  Cardura  Current BP meds: resumed coreg  On cleviprex - taper off as able  Stable . BP < 180/105 post tPA . Long-term BP goal normotensive  Hyperlipidemia  Home Lipid lowering medication: Lipitor 80 mg daily  LDL 11, goal < 70  Current lipid lowering medication: decrease to lipitor 40   Continue statin at discharge  Diabetes  Home diabetic meds: insulin  Current diabetic meds: SSI   HgbA1c 6.0, goal < 7.0  Controlled  CBG monitoring  Follow up with PCP  Other Stroke Risk Factors  Advanced age  Former cigarette  smoker - quit  ETOH use, advised to drink no more than 1 alcoholic beverage per day.  Hx stroke/TIA  Coronary artery disease  Dysphagia   Speech on board  Passed MBS  On dys3 and thin  Gentle hydration  Other Active Problems  Code status - DNR   Dementia on Aricept and nemanda - continue   Hx of multiple syncopal episodes CKD - stage 3a - Cre 1.30 - repeat pending Anemia of chronic disease - Hgb - 9.5 - repeat pending Liquid stool reported - C-diff quick screen ordered 01/01/20 - pending   Hospital day # 1  This patient is critically ill due to stroke post procedure and at significant risk of neurological worsening, death form severe anemia, bleeding, recurrent stroke, intracranial stenosis. This patient's care requires constant monitoring of vital signs, hemodynamics, respiratory and cardiac monitoring, review of multiple databases, neurological assessment, other specialists and medical decision making of high complexity. I spent 35 minutes of neurocritical care time in the care of this patient.  We will continue with the IV medication for blood pressure control but weaned as appropriate.      To contact Stroke Continuity provider, please refer to [email or web address] After hours, contact General Neurology

## 2020-01-01 NOTE — Progress Notes (Signed)
Progress Note  Patient Name: Christine Schiefelbein Date of Encounter: 01/01/2020  Primary Cardiologist: [name], MD   Subjective   No cardiac complaints able to sleep flat   Inpatient Medications    Scheduled Meds: . allopurinol  100 mg Oral Daily  . aspirin EC  81 mg Oral Daily  . atorvastatin  40 mg Oral Daily  . carvedilol  3.125 mg Oral BID WC  . Chlorhexidine Gluconate Cloth  6 each Topical Daily  . clopidogrel  75 mg Oral Daily  . donepezil  10 mg Oral Daily  . DULoxetine  30 mg Oral Daily  . ferrous sulfate  325 mg Oral Daily  . finasteride  5 mg Oral Daily  . gabapentin  300 mg Oral BID  . insulin aspart  0-9 Units Subcutaneous TID WC  . isosorbide mononitrate  60 mg Oral Daily  . labetalol  20 mg Intravenous Once  . memantine  10 mg Oral BID   Continuous Infusions: . sodium chloride    . sodium chloride 50 mL/hr at 01/01/20 0100  . clevidipine 2 mg/hr (01/01/20 0100)   PRN Meds: acetaminophen **OR** acetaminophen (TYLENOL) oral liquid 160 mg/5 mL **OR** acetaminophen, senna-docusate   Vital Signs    Vitals:   01/01/20 0321 01/01/20 0400 01/01/20 0500 01/01/20 0600  BP: (!) 176/54 (!) 162/51 (!) 137/106 (!) 184/54  Pulse: 80 77 71 82  Resp: 20 19 15 18   Temp:  98.9 F (37.2 C)    TempSrc:  Oral    SpO2: 98% 99% 98% 96%    Intake/Output Summary (Last 24 hours) at 01/01/2020 0748 Last data filed at 01/01/2020 0600 Gross per 24 hour  Intake 803.93 ml  Output 620 ml  Net 183.93 ml   There were no vitals filed for this visit.  Telemetry    Sinus rhythm - Personally Reviewed  ECG    Not performed today - Personally Reviewed  Physical Exam   Affect appropriate Chronically ill male  HEENT: normal Neck supple with no adenopathy JVP normal no bruits no thyromegaly Lungs clear with no wheezing and good diaphragmatic motion Heart:  S1/S2 no murmur, no rub, gallop or click PMI normal Abdomen: benighn, BS positve, no tenderness, no  AAA no bruit.  No HSM or HJR Right forefoot amputation  No edema Skin warm and dry No muscular weakness   Labs    Chemistry Recent Labs  Lab 12/30/19 1210 12/30/19 2315 12/30/19 2324  NA 143 138 141  K 3.8 3.6 3.6  CL 108 106 103  CO2 21* 23  --   GLUCOSE 130* 221* 203*  BUN 18 18 18   CREATININE 1.24 1.47* 1.30*  CALCIUM 9.1 8.0*  --   PROT  --  5.0*  --   ALBUMIN  --  2.6*  --   AST  --  22  --   ALT  --  24  --   ALKPHOS  --  70  --   BILITOT  --  0.3  --   GFRNONAA 56* 46*  --   ANIONGAP 14 9  --      Hematology Recent Labs  Lab 12/30/19 1210 12/30/19 1210 12/30/19 2315 12/30/19 2324 01/01/20 0729  WBC 7.8  --  7.0  --  8.5  RBC 4.30  --  3.27*  --  3.44*  HGB 11.2*   < > 8.7* 9.5* 9.2*  HCT 35.8*   < > 27.9* 28.0* 28.8*  MCV 83.3  --  85.3  --  83.7  MCH 26.0  --  26.6  --  26.7  MCHC 31.3  --  31.2  --  31.9  RDW 17.8*  --  17.6*  --  17.3*  PLT 308  --  235  --  270   < > = values in this interval not displayed.       Radiology    CT Code Stroke CTA Head W/WO contrast  Result Date: 12/31/2019 CLINICAL DATA:  Initial evaluation for acute stroke, left-sided deficits. EXAM: CT ANGIOGRAPHY HEAD AND NECK TECHNIQUE: Multidetector CT imaging of the head and neck was performed using the standard protocol during bolus administration of intravenous contrast. Multiplanar CT image reconstructions and MIPs were obtained to evaluate the vascular anatomy. Carotid stenosis measurements (when applicable) are obtained utilizing NASCET criteria, using the distal internal carotid diameter as the denominator. CONTRAST:  65mL OMNIPAQUE IOHEXOL 350 MG/ML SOLN COMPARISON:  Prior head CT from earlier same day. FINDINGS: CTA NECK FINDINGS Aortic arch: Visualized aortic arch normal caliber without aneurysm. Bovine branching pattern with common origin of the right great cephalic and left common carotid artery noted. Mild-to-moderate atheromatous change noted within the proximal  descending intrathoracic aorta. No hemodynamically significant stenosis about the origin the great vessels. Right carotid system: Right CCA patent from its origin to the bifurcation without stenosis. Scattered calcified plaque about the right bifurcation/proximal right ICA with associated stenosis of up to 50% by NASCET criteria. Right ICA patent distally to the skull base without stenosis, dissection or occlusion. Left carotid system: Left common carotid artery patent from its origin to the bifurcation without significant stenosis. Multifocal mixed plaque about the left bifurcation/proximal left ICA with estimated 45% stenosis by NASCET criteria. Left ICA mildly irregular but otherwise widely patent to the skull base without additional stenosis, dissection or occlusion. Vertebral arteries: Both vertebral arteries arise from the subclavian arteries. No proximal subclavian artery stenosis. Atheromatous change at the origins of the vertebral arteries with no more than mild stenosis on the right. Vertebral arteries otherwise patent without stenosis, dissection or occlusion. Skeleton: No visible acute osseous abnormality. No discrete or worrisome osseous lesions. Moderate multilevel cervical spondylosis. Other neck: No other acute soft tissue abnormality within the neck. No mass or adenopathy. Upper chest: Small amount of secretions noted within the proximal right mainstem bronchus, suggesting that this patient is at risk for aspiration. Visualized upper chest demonstrates no other acute finding. Review of the MIP images confirms the above findings CTA HEAD FINDINGS Anterior circulation: Extensive atheromatous change throughout the petrous, cavernous, and supraclinoid right ICA. Associated severe stenosis at the anterior genu of the cavernous segment. Additional scattered moderate multifocal narrowing elsewhere. On the left, scattered atheromatous change throughout the left carotid siphon with no more than mild to  moderate multifocal narrowing. Left A1 segment widely patent. Right A1 hypoplastic with a severe proximal stenosis (series 7, image 102). Normal anterior communicating artery complex. Anterior cerebral arteries patent to their distal aspects without stenosis. Long segment severe near occlusive stenosis of the proximal right M1 segment measuring approximately 8-9 mm in length (series 7, image 104). Distal right MCA branches patent but attenuated distally. Left M1 widely patent. Normal left MCA bifurcation. Distal left MCA branches well perfused. Posterior circulation: Atheromatous change about the V4 segments bilaterally with associated moderate short-segment stenoses, right worse than left. Left vertebral artery dominant. Both PICAs patent. Basilar patent to its distal aspect without stenosis. Superior cerebral arteries patent bilaterally. Right PCA supplied via the  basilar. 2 mm conical shaped focal outpouching arising at the junction of the right P1 and P2 segment favored to reflect a vascular infundibulum related to an occluded hypoplastic right PCOM. Fetal type origin of the left PCA. Both PCAs remain well perfused to their distal aspects. Venous sinuses: Grossly patent allowing for timing the contrast bolus. Anatomic variants: Hypoplastic right A1 segment. Dominant left vertebral artery. Fetal type origin of the left PCA. Review of the MIP images confirms the above findings IMPRESSION: 1. Negative CTA for emergent large vessel occlusion. 2. Approximate 8-9 mm severe near occlusive stenosis of the proximal right M1 segment. Right MCA branches attenuated but patent distally with no visible downstream occlusion. 3. Additional severe proximal right A1 stenosis. 4. Atheromatous change about the carotid bifurcations with associated stenoses of up to 50% on the right and 45% on the left. 5. Atheromatous change about the right carotid siphon with associated moderate to severe multifocal stenoses. 6. Small amount of  secretions within the proximal right mainstem bronchus, suggesting that this patient is at risk for aspiration. Critical Value/emergent results were discussed by telephone at the time of interpretation on 12/30/2019 at 11:40 p.m. to provider [facility] , who verbally acknowledged these results. Electronically Signed   By: Jeannine Boga M.D.   On: 12/31/2019 00:17   DG Chest 2 View  Result Date: 12/30/2019 CLINICAL DATA:  Chest pain. EXAM: CHEST - 2 VIEW COMPARISON:  06/27/2019. FINDINGS: Cardiac monitor device noted in stable position. Stable cardiomegaly. No pulmonary venous congestion. Low lung volumes. No focal infiltrate. No pleural effusion or pneumothorax. Degenerative change thoracic spine. IMPRESSION: 1. Stable cardiomegaly. No pulmonary venous congestion. 2. Low lung volumes. Electronically Signed   By: Marcello Moores  Register   On: 12/30/2019 12:38   CT HEAD WO CONTRAST  Result Date: 01/01/2020 CLINICAL DATA:  Follow-up examination 24 hour status post tPA. EXAM: CT HEAD WITHOUT CONTRAST TECHNIQUE: Contiguous axial images were obtained from the base of the skull through the vertex without intravenous contrast. COMPARISON:  Prior MRI from 12/31/2019. FINDINGS: Brain: Examination technically limited by the hardening artifact extending through the skull base. Patchy evolving small volume acute ischemic infarcts involving the subinsular white matter, adjacent insular cortex, and overlying operculum noted on the right, corresponding with findings on prior MRI. No interval hemorrhage, mass effect, or other complication. No other evidence for acute or new large vessel territory infarct. No intracranial hemorrhage elsewhere within the brain. Atrophy with chronic microvascular ischemic disease again noted. No mass effect or midline shift. No hydrocephalus or extra-axial fluid collection. Vascular: No hyperdense vessel. Calcified atherosclerosis at the skull base. Skull: No new finding. Sinuses/Orbits:  Globes orbital soft tissues demonstrate no acute finding. Paranasal sinuses are clear. Small right mastoid effusion again noted. Other: None. IMPRESSION: 1. Patchy evolving small volume acute ischemic infarcts involving the right subinsular white matter, adjacent insular cortex, and overlying operculum, corresponding with findings on prior MRI. No interval hemorrhage, mass effect, or other complication. 2. No other new acute intracranial abnormality. Electronically Signed   By: Jeannine Boga M.D.   On: 01/01/2020 02:25   CT Code Stroke CTA Neck W/WO contrast  Result Date: 12/31/2019 CLINICAL DATA:  Initial evaluation for acute stroke, left-sided deficits. EXAM: CT ANGIOGRAPHY HEAD AND NECK TECHNIQUE: Multidetector CT imaging of the head and neck was performed using the standard protocol during bolus administration of intravenous contrast. Multiplanar CT image reconstructions and MIPs were obtained to evaluate the vascular anatomy. Carotid stenosis measurements (when applicable) are obtained  utilizing NASCET criteria, using the distal internal carotid diameter as the denominator. CONTRAST:  15mL OMNIPAQUE IOHEXOL 350 MG/ML SOLN COMPARISON:  Prior head CT from earlier same day. FINDINGS: CTA NECK FINDINGS Aortic arch: Visualized aortic arch normal caliber without aneurysm. Bovine branching pattern with common origin of the right great cephalic and left common carotid artery noted. Mild-to-moderate atheromatous change noted within the proximal descending intrathoracic aorta. No hemodynamically significant stenosis about the origin the great vessels. Right carotid system: Right CCA patent from its origin to the bifurcation without stenosis. Scattered calcified plaque about the right bifurcation/proximal right ICA with associated stenosis of up to 50% by NASCET criteria. Right ICA patent distally to the skull base without stenosis, dissection or occlusion. Left carotid system: Left common carotid artery  patent from its origin to the bifurcation without significant stenosis. Multifocal mixed plaque about the left bifurcation/proximal left ICA with estimated 45% stenosis by NASCET criteria. Left ICA mildly irregular but otherwise widely patent to the skull base without additional stenosis, dissection or occlusion. Vertebral arteries: Both vertebral arteries arise from the subclavian arteries. No proximal subclavian artery stenosis. Atheromatous change at the origins of the vertebral arteries with no more than mild stenosis on the right. Vertebral arteries otherwise patent without stenosis, dissection or occlusion. Skeleton: No visible acute osseous abnormality. No discrete or worrisome osseous lesions. Moderate multilevel cervical spondylosis. Other neck: No other acute soft tissue abnormality within the neck. No mass or adenopathy. Upper chest: Small amount of secretions noted within the proximal right mainstem bronchus, suggesting that this patient is at risk for aspiration. Visualized upper chest demonstrates no other acute finding. Review of the MIP images confirms the above findings CTA HEAD FINDINGS Anterior circulation: Extensive atheromatous change throughout the petrous, cavernous, and supraclinoid right ICA. Associated severe stenosis at the anterior genu of the cavernous segment. Additional scattered moderate multifocal narrowing elsewhere. On the left, scattered atheromatous change throughout the left carotid siphon with no more than mild to moderate multifocal narrowing. Left A1 segment widely patent. Right A1 hypoplastic with a severe proximal stenosis (series 7, image 102). Normal anterior communicating artery complex. Anterior cerebral arteries patent to their distal aspects without stenosis. Long segment severe near occlusive stenosis of the proximal right M1 segment measuring approximately 8-9 mm in length (series 7, image 104). Distal right MCA branches patent but attenuated distally. Left M1  widely patent. Normal left MCA bifurcation. Distal left MCA branches well perfused. Posterior circulation: Atheromatous change about the V4 segments bilaterally with associated moderate short-segment stenoses, right worse than left. Left vertebral artery dominant. Both PICAs patent. Basilar patent to its distal aspect without stenosis. Superior cerebral arteries patent bilaterally. Right PCA supplied via the basilar. 2 mm conical shaped focal outpouching arising at the junction of the right P1 and P2 segment favored to reflect a vascular infundibulum related to an occluded hypoplastic right PCOM. Fetal type origin of the left PCA. Both PCAs remain well perfused to their distal aspects. Venous sinuses: Grossly patent allowing for timing the contrast bolus. Anatomic variants: Hypoplastic right A1 segment. Dominant left vertebral artery. Fetal type origin of the left PCA. Review of the MIP images confirms the above findings IMPRESSION: 1. Negative CTA for emergent large vessel occlusion. 2. Approximate 8-9 mm severe near occlusive stenosis of the proximal right M1 segment. Right MCA branches attenuated but patent distally with no visible downstream occlusion. 3. Additional severe proximal right A1 stenosis. 4. Atheromatous change about the carotid bifurcations with associated stenoses of up to  50% on the right and 45% on the left. 5. Atheromatous change about the right carotid siphon with associated moderate to severe multifocal stenoses. 6. Small amount of secretions within the proximal right mainstem bronchus, suggesting that this patient is at risk for aspiration. Critical Value/emergent results were discussed by telephone at the time of interpretation on 12/30/2019 at 11:40 p.m. to provider [facility] , who verbally acknowledged these results. Electronically Signed   By: Jeannine Boga M.D.   On: 12/31/2019 00:17   MR BRAIN WO CONTRAST  Result Date: 12/31/2019 CLINICAL DATA:  Initial evaluation for  acute stroke, left-sided deficits. EXAM: MRI HEAD WITHOUT CONTRAST TECHNIQUE: Multiplanar, multiecho pulse sequences of the brain and surrounding structures were obtained without intravenous contrast. COMPARISON:  Prior CTs from 12/30/2019. FINDINGS: Brain: Limited stroke protocol MRI was performed. Age-related cerebral atrophy. Patchy T2/FLAIR hyperintensity within the periventricular white matter most consistent with chronic small vessel ischemic disease, mild for age. Patchy and hazy diffusion abnormality seen involving the right insular cortex and overlying right frontal operculum, consistent with acute right MCA territory ischemia. Patchy involvement of the adjacent subinsular white matter. Finding likely related to the previously identified severe right M1 stenosis. No associated hemorrhage or mass effect. No other evidence for acute or subacute ischemia. Gray-white matter differentiation otherwise maintained. No other areas of encephalomalacia to suggest chronic cortical infarction. No foci of susceptibility artifact to suggest acute or chronic intracranial hemorrhage. No mass lesion, midline shift or mass effect. No hydrocephalus or extra-axial fluid collection. Vascular: Major intracranial vascular flow voids are grossly maintained skull base. Skull and upper cervical spine: Grossly unremarkable on this limited exam. Sinuses/Orbits: Globes and orbital soft tissues demonstrate no acute finding. Paranasal sinuses are grossly clear. Trace right mastoid effusion noted. Other: None. IMPRESSION: 1. Patchy and hazy diffusion abnormality involving the right insular cortex, subinsular white matter, and overlying right frontal operculum, consistent with acute right MCA territory ischemia. No associated hemorrhage or mass effect. Finding is consistent with the previously identified severe right M1 stenosis. 2. No other acute intracranial abnormality. 3. Age-related cerebral atrophy with mild chronic small vessel  ischemic disease. Electronically Signed   By: Jeannine Boga M.D.   On: 12/31/2019 00:37   ECHOCARDIOGRAM COMPLETE  Result Date: 12/31/2019    ECHOCARDIOGRAM REPORT   Patient Name:   HAREL Finkler Date of Exam: 12/31/2019 Medical Rec #:  [id number]        Height:       67.0 in Accession #:    [id number]       Weight:       160.0 lb Date of Birth:  March 10, 1930        BSA:          1.839 m Patient Age:    56 years         BP:           142/50 mmHg Patient Gender: M                HR:           69 bpm. Exam Location:  Inpatient Procedure: 2D Echo Indications:    stroke 434.91  History:        Patient has prior history of Echocardiogram examinations, most                 recent 06/27/2019. CAD, chronic kidney disease; Risk                 Factors:Hypertension  and Diabetes.  Sonographer:    Johny Chess Referring Phys: [id number] ASHISH ARORA IMPRESSIONS  1. Left ventricular ejection fraction, by estimation, is 60 to 65%. The left ventricle has normal function. The left ventricle has no regional wall motion abnormalities. There is mild concentric left ventricular hypertrophy. Left ventricular diastolic parameters are consistent with Grade II diastolic dysfunction (pseudonormalization). Elevated left atrial pressure. The E/e' is 20.  2. Right ventricular systolic function is normal. The right ventricular size is normal. Tricuspid regurgitation signal is inadequate for assessing PA pressure.  3. Left atrial size was mildly dilated.  4. The mitral valve is degenerative. No evidence of mitral valve regurgitation. No evidence of mitral stenosis.  5. The aortic valve is tricuspid. Aortic valve regurgitation is not visualized. No aortic stenosis is present.  6. The inferior vena cava is normal in size with greater than 50% respiratory variability, suggesting right atrial pressure of 3 mmHg. Comparison(s): No prior Echocardiogram. Conclusion(s)/Recommendation(s): No intracardiac source of embolism detected on this  transthoracic study. A transesophageal echocardiogram is recommended to exclude cardiac source of embolism if clinically indicated. FINDINGS  Left Ventricle: Left ventricular ejection fraction, by estimation, is 60 to 65%. The left ventricle has normal function. The left ventricle has no regional wall motion abnormalities. The left ventricular internal cavity size was normal in size. There is  mild concentric left ventricular hypertrophy. Left ventricular diastolic parameters are consistent with Grade II diastolic dysfunction (pseudonormalization). Elevated left atrial pressure. The E/e' is 6. Right Ventricle: The right ventricular size is normal. No increase in right ventricular wall thickness. Right ventricular systolic function is normal. Tricuspid regurgitation signal is inadequate for assessing PA pressure. Left Atrium: Left atrial size was mildly dilated. Right Atrium: Right atrial size was normal in size. Pericardium: Trivial pericardial effusion is present. Mitral Valve: The mitral valve is degenerative in appearance. There is mild calcification of the anterior mitral valve leaflet(s). No evidence of mitral valve regurgitation. No evidence of mitral valve stenosis. Tricuspid Valve: The tricuspid valve is grossly normal. Tricuspid valve regurgitation is trivial. No evidence of tricuspid stenosis. Aortic Valve: The aortic valve is tricuspid. Aortic valve regurgitation is not visualized. No aortic stenosis is present. Pulmonic Valve: The pulmonic valve was grossly normal. Pulmonic valve regurgitation is not visualized. No evidence of pulmonic stenosis. Aorta: The aortic root and ascending aorta are structurally normal, with no evidence of dilitation. Venous: The inferior vena cava is normal in size with greater than 50% respiratory variability, suggesting right atrial pressure of 3 mmHg. IAS/Shunts: The atrial septum is grossly normal. Additional Comments: There is a small pleural effusion in the right lateral  region.  LEFT VENTRICLE PLAX 2D LVIDd:         4.90 cm  Diastology LVIDs:         3.30 cm  LV e' medial:    4.57 cm/s LV PW:         1.10 cm  LV E/e' medial:  18.4 LV IVS:        1.30 cm  LV e' lateral:   5.98 cm/s LVOT diam:     2.20 cm  LV E/e' lateral: 14.0 LV SV:         111 LV SV Index:   60 LVOT Area:     3.80 cm  RIGHT VENTRICLE             IVC RV S prime:     15.30 cm/s  IVC diam: 1.60 cm TAPSE (M-mode): 2.4 cm  LEFT ATRIUM             Index       RIGHT ATRIUM           Index LA diam:        4.20 cm 2.28 cm/m  RA Area:     13.30 cm LA Vol (A2C):   78.9 ml 42.90 ml/m RA Volume:   31.50 ml  17.13 ml/m LA Vol (A4C):   59.4 ml 32.30 ml/m LA Biplane Vol: 69.2 ml 37.62 ml/m  AORTIC VALVE LVOT Vmax:   124.00 cm/s LVOT Vmean:  82.400 cm/s LVOT VTI:    0.292 m  AORTA Ao Root diam: 3.20 cm Ao Asc diam:  3.00 cm MITRAL VALVE MV Area (PHT): 2.83 cm     SHUNTS MV Decel Time: 268 msec     Systemic VTI:  0.29 m MV E velocity: 83.90 cm/s   Systemic Diam: 2.20 cm MV A velocity: 131.00 cm/s MV E/A ratio:  0.64 Eleonore Chiquito MD Electronically signed by Eleonore Chiquito MD Signature Date/Time: 12/31/2019/11:18:26 AM    Final    CT HEAD CODE STROKE WO CONTRAST  Result Date: 12/30/2019 CLINICAL DATA:  Code stroke. Initial evaluation for acute left-sided weakness. EXAM: CT HEAD WITHOUT CONTRAST TECHNIQUE: Contiguous axial images were obtained from the base of the skull through the vertex without intravenous contrast. COMPARISON:  None. FINDINGS: Brain: Age-related cerebral atrophy with mild chronic small vessel ischemic disease. No acute intracranial hemorrhage. No definite acute large vessel territory infarct. No mass lesion, mass effect, or midline shift. No hydrocephalus or extra-axial fluid collection. Vascular: No hyperdense vessel. Calcified atherosclerosis present at skull base. Skull: Scalp soft tissues within normal limits.  Calvarium intact. Sinuses/Orbits: Globes and orbital soft tissues within normal limits.  Visualized paranasal sinuses are clear. Small chronic appearing right mastoid effusion. Other: None. ASPECTS [facility] Stroke Program Early CT Score) - Ganglionic level infarction (caudate, lentiform nuclei, internal capsule, insula, M1-M3 cortex): 7 - Supraganglionic infarction (M4-M6 cortex): 3 Total score (0-10 with 10 being normal): 10 IMPRESSION: 1. No acute intracranial infarct or other abnormality. 2. ASPECTS is 10. 3. Age-appropriate cerebral atrophy with mild chronic small vessel ischemic disease. These results were communicated to Dr. Rory Percy at 11:39 pmon 11/18/2021by text page via the [facility] messaging system. Electronically Signed   By: Jeannine Boga M.D.   On: 12/30/2019 23:46    Cardiac Studies   Echo pending  Patient Profile     86 year old male with history of peripheral arterial disease, CAD s/p pLAD atherectomy and PCI 03/1306, diastolic dysfunction, hypertension, diabetes who presented with acute CVA now status-post TPA on whom we are consulted regarding troponin elevation  Assessment & Plan    1. Elevated Troponin: Patient presented with acute CVA with elevated troponin 35>50>215>209. EKG without evidence of ischemia and echo 12/31/19 EF 60-65% with no RWMAls  He continues to deny any chest pain. Patient's risk factors for ACS include previous CAD s/p DES, previous smoking history, HLD, HTN, and controlled DM. His elevated troponin is likely secondary to demand ischemia in the setting of significantly elevated BP and acute stroke. No further inpatient cardiac w/u planned   2. Acute CVA Patient presented with acute right, frontal MCA stroke s/p TPA and admitted to the ICU for strict blood pressure control with clevidipine gtt with good control of blood pressure. Defer management to neurology.   3. HTN Patient developed elevated blood pressures opf 202/79 and was started on Clevidipine gtt. Would  consider transition to oral medication   4. CKD (IIIa) Patient has a history  of CKD likely secondary to HTN and DM. His baseline Cr is 1.47  He is at baseline at this time.   5. CAD s/p DES to LAD (06/2019): Patient had a left heart cath on 07/02/19 showing severe single vessel CAD with 80-90% stenosis of the proximal LAD. There was also mild-moderate mid/distal LAD and RCA/rPDA disease. He is no s/p IVUS guided orbital atherectomy and PCI/DES of the ostial to mid LAD/. He was started on DAPT for 12 months with ASA and Plavix and atorvastatin 80 mg daily. His lipid panel during this hospitalization showed total of 85, TAG 152, LDL of 11. His A1c is well controlled at 6.0.  - DAPT was held due to TPA, resume f/u CT today no hemorrhage  6. IDDM: Patient has a history of insulin dependant DM with his most recent A1c of 6.0 on Lantus 20 units qHS. His diabetes appears to be well controlled and his kidney function is not bad enough to affect the reliability of his A1c results.   For questions or updates, please contact [place] Please consult [email or web address] for contact info under Cardiology/STEMI.      Signed, Jenkins Rouge, MD  01/01/2020, 7:48 AM  Pager: [phone]

## 2020-01-01 NOTE — Plan of Care (Signed)
?  Problem: Nutrition: ?Goal: Dietary intake will improve ?Outcome: Progressing ?  ?

## 2020-01-01 NOTE — Progress Notes (Signed)
Modified Barium Swallow Progress Note  Patient Details  Name: Peter [name] MRN: [id number] Date of Birth: 1930/06/20  Today's Date: 01/01/2020  Modified Barium Swallow completed.  Full report located under Chart Review in the Imaging Section.  Brief recommendations include the following:  Clinical Impression  Pt demonstrates a moderate oral dysphagia with poor bolus cohesion and formation leading to premature spillage of thin liquids and lingual residue post swallow. Pt has instances of aspiration of the spilled "head" of the bolus before/during the swallow and also pooling of the "tail" of end of the bolus that spills from oral cavity to pharyngeal sinuses post swallow. Pt does not sense aspiration until well after event as quantity of accumulated aspirate triggers a cough as it descends into trachea. Attempted cues for single sips and slower rate, chin tuck and preventative throat clearing, none of which were particularly effective in study. Chin tuck worsened aspiration. A nectar bolus was more cohesive and was not aspirated even with consecutive sips. Recommend a regular texture with nectar thick liquids. Pt may benefit from base of tongue strengthening and strategies to improve awareness of bolus formation. Will f/u acutely.    Swallow Evaluation Recommendations       SLP Diet Recommendations: Regular solids;Nectar thick liquid   Liquid Administration via: Cup;No straw   Medication Administration: Whole meds with puree   Supervision: Patient able to self feed;Staff to assist with self feeding   Compensations: Slow rate;Small sips/bites   Postural Changes: Seated upright at 90 degrees           Herbie Baltimore, MA CCC-SLP  Acute Rehabilitation Services Pager [phone] Office [phone]  Tayron Hunnell, Katherene Ponto 01/01/2020,1:15 PM

## 2020-01-01 NOTE — Evaluation (Signed)
Occupational Therapy Evaluation Patient Details Name: Peter [name] MRN: [id number] DOB: 1930/09/03 Today's Date: 01/01/2020    History of Present Illness Pt is an 86 yo male presenting with acute onset of fluctuating slurred speech, L sided weakness, and difficulty swallowing s/p tPA. MRI concerning for R MCA territory ischemia. CTA revealed a small amount of secretions within the proximal R mainstem bronchus, suggestive of aspiration risk. PMH includes: prostate ca, PAD, MI, mild dementia, HTN, HLD, gout, GERD, DM, CAD, CKD    Clinical Impression   Pt PTA: Pt living at home with daughter assisting, but mostly paid caregivers to assist with all IADLs. Pt was independent with ADL prior. Pt currently with minimal weakness in LUE. Pt supervisionA to minguardA for OOB ADL. Pt standing upright at sink for grooming and seated for break and to conserve energy with minguardA. Mobility with minguardA overall with RW. Pt would benefit from continued OT skilled services for ADL, mobility and safety in [place] setting. OT following acutely.      Follow Up Recommendations  Home health OT;Supervision/Assistance - 24 hour    Equipment Recommendations  3 in 1 bedside commode    Recommendations for Other Services       Precautions / Restrictions Precautions Precautions: Fall Restrictions Weight Bearing Restrictions: No      Mobility Bed Mobility Overal bed mobility: Needs Assistance Bed Mobility: Supine to Sit     Supine to sit: Supervision          Transfers Overall transfer level: Needs assistance   Transfers: Sit to/from Stand Sit to Stand: Min assist         General transfer comment: power up only; pt able to stabilize self once standing    Balance Overall balance assessment: Needs assistance Sitting-balance support: No upper extremity supported;Feet supported Sitting balance-Leahy Scale: Good     Standing balance support: Single extremity supported;During functional  activity Standing balance-Leahy Scale: Fair Standing balance comment: BUE on RW                           ADL either performed or assessed with clinical judgement   ADL Overall ADL's : At baseline                                       General ADL Comments: Pt supervisionA to minguardA for OOB ADL. Pt standing upright at sink for grooming and seated for break and to conserve energy.     Vision Baseline Vision/History: Wears glasses Wears Glasses: At all times Patient Visual Report: No change from baseline Vision Assessment?: No apparent visual deficits Additional Comments: glasses not in room     Perception     Praxis      Pertinent Vitals/Pain Pain Assessment: Faces Faces Pain Scale: No hurt Pain Intervention(s): Monitored during session     Hand Dominance Right   Extremity/Trunk Assessment Upper Extremity Assessment Upper Extremity Assessment: Generalized weakness;RUE deficits/detail;LUE deficits/detail RUE Deficits / Details: AROM, WFLs LUE Deficits / Details: AROM, WFLs; small 3- to 3+/5 MM grade shoulder through digits   Lower Extremity Assessment Lower Extremity Assessment: Overall WFL for tasks assessed;Defer to PT evaluation   Cervical / Trunk Assessment Cervical / Trunk Assessment: Kyphotic   Communication Communication Communication: No difficulties   Cognition Arousal/Alertness: Awake/alert Behavior During Therapy: WFL for tasks assessed/performed Overall Cognitive Status: Within Functional Limits for tasks  assessed                                 General Comments: Pt answering all questions clearly   General Comments  VSS    Exercises     Shoulder Instructions      Home Living Family/patient expects to be discharged to:: Private residence Living Arrangements: Spouse/significant other Available Help at Discharge: Family;Personal care attendant;Other (Comment);Available PRN/intermittently (2 separate  PCA 7 days/week from ~12-6 pm) Type of Home: House Home Access: Stairs to enter [employer] of Steps: 3 Entrance Stairs-Rails: Right;Left;Can reach both Home Layout:  (stays on ML) Alternate Level Stairs-Number of Steps: flight Alternate Level Stairs-Rails: Right;Left Bathroom Shower/Tub: Occupational psychologist: Handicapped height     Home Equipment: Environmental consultant - 2 wheels;Wheelchair - manual;Shower seat - built in;Bedside commode;Walker - 4 wheels          Prior Functioning/Environment Level of Independence: Needs assistance  Gait / Transfers Assistance Needed: Pt reports ambulating with RW or using a WC at baseline depending on needs that day. ADL's / Homemaking Assistance Needed: States he is generally requires at least some assist for ADL management. Has PCA 7 days/week that assists with house cleaning, medication management.   Comments: Pt ambulating with RW or using w/c at baseline. Reports he needed some assist PTA but wife is not in great physical health either. Daughter can assist some.        OT Problem List: Decreased strength;Decreased activity tolerance;Impaired balance (sitting and/or standing);Decreased safety awareness;Impaired UE functional use      OT Treatment/Interventions: Self-care/ADL training;Therapeutic exercise;Energy conservation;DME and/or AE instruction;Therapeutic activities;Cognitive remediation/compensation;Patient/family education;Visual/perceptual remediation/compensation;Balance training    OT Goals(Current goals can be found in the care plan section) Acute Rehab OT Goals Patient Stated Goal: get better, go home OT Goal Formulation: With patient Time For Goal Achievement: 01/15/20 Potential to Achieve Goals: Good ADL Goals Pt Will Perform Lower Body Dressing: with min guard assist;sitting/lateral leans;sit to/from stand Pt/caregiver will Perform Home Exercise Program: Increased strength;With written HEP provided;Left upper  extremity Additional ADL Goal #1: Pt will perform x6 mins of OOB ADL tasks with fair balance and no verbal cues for proper technique.  OT Frequency: Min 2X/week   Barriers to D/C:            Co-evaluation              AM-PAC OT "6 Clicks" Daily Activity     Outcome Measure Help from another person eating meals?: None Help from another person taking care of personal grooming?: A Little Help from another person toileting, which includes using toliet, bedpan, or urinal?: A Little Help from another person bathing (including washing, rinsing, drying)?: A Little Help from another person to put on and taking off regular upper body clothing?: A Little Help from another person to put on and taking off regular lower body clothing?: A Little 6 Click Score: 19   End of Session Equipment Utilized During Treatment: Gait belt;Rolling walker Nurse Communication: Mobility status  Activity Tolerance: Patient tolerated treatment well Patient left: in chair;with call bell/phone within reach;with chair alarm set  OT Visit Diagnosis: Unsteadiness on feet (R26.81);Muscle weakness (generalized) (M62.81);Hemiplegia and hemiparesis Hemiplegia - Right/Left: Left Hemiplegia - caused by: Unspecified                Time: 6599-3570 OT Time Calculation (min): 47 min Charges:  OT General Charges $OT  Visit: 1 Visit OT Evaluation $OT Eval Moderate Complexity: 1 Mod OT Treatments $Self Care/Home Management : 8-22 mins $Therapeutic Activity: 8-22 mins  Jefferey Pica, OTR/L Acute Rehabilitation Services Pager: [phone] Office: [phone]   Yuki Brunsman C 01/01/2020, 2:30 PM

## 2020-01-01 NOTE — Progress Notes (Signed)
Physical Therapy Treatment Patient Details Name: Peter [name] MRN: [id number] DOB: 07-Feb-1931 Today's Date: 01/01/2020    History of Present Illness Pt is an 86 yo male presenting with acute onset of fluctuating slurred speech, L sided weakness, and difficulty swallowing s/p tPA. MRI concerning for R MCA territory ischemia. CTA revealed a small amount of secretions within the proximal R mainstem bronchus, suggestive of aspiration risk. PMH includes: prostate ca, PAD, MI, mild dementia, HTN, HLD, gout, GERD, DM, CAD, CKD     PT Comments    The pt is making good progress with PT goals and mobility at this time. He was able to complete significantly increased hallway ambulation distance with minG for safety and RW to maintain stability. The pt was also able to complete multiple sit-stand transitions with minG and without use of UE on RW to steady. He initially required increased time and effort to stand, but was able to complete with increased speed and stability with reps. Continue to recommend 24/7 supervision initially following d/c, but pt will be safe to return home with support of daughters and aides.    Follow Up Recommendations  Home health PT;Supervision for mobility/OOB (discussed 24/7 supervision for first few days)     Equipment Recommendations  None recommended by PT    Recommendations for Other Services       Precautions / Restrictions Precautions Precautions: Fall Restrictions Weight Bearing Restrictions: No    Mobility  Bed Mobility Overal bed mobility: Needs Assistance Bed Mobility: Supine to Sit     Supine to sit: Supervision     General bed mobility comments: pt with use of the rail (which he has) for slow rise via L elbow without assist, does need increased time and assist for line management  Transfers Overall transfer level: Needs assistance Equipment used: Rolling walker (2 wheeled) Transfers: Sit to/from Stand Sit to Stand: Min guard          General transfer comment: minA initially to power up, progressed to minG without use of RW. 5xSTS in 30 sec  Ambulation/Gait Ambulation/Gait assistance: Min guard Gait Distance (Feet): 200 Feet Assistive device: Rolling walker (2 wheeled) Gait Pattern/deviations: Step-through pattern Gait velocity: 0.36 m/s Gait velocity interpretation: <1.31 ft/sec, indicative of household ambulator General Gait Details: slightly shortened stride with no lateral or posterior instability noted. cues for positioning in RW at times but pt able to maintain well    Modified Rankin (Stroke Patients Only) Modified Rankin (Stroke Patients Only) Pre-Morbid Rankin Score: No significant disability Modified Rankin: Moderately severe disability     Balance Overall balance assessment: Needs assistance Sitting-balance support: No upper extremity supported;Feet supported Sitting balance-Leahy Scale: Good Sitting balance - Comments: no support needed   Standing balance support: Single extremity supported;During functional activity Standing balance-Leahy Scale: Poor Standing balance comment: BUE on RW                            Cognition Arousal/Alertness: Awake/alert Behavior During Therapy: WFL for tasks assessed/performed Overall Cognitive Status: Within Functional Limits for tasks assessed                                 General Comments: Pt answering all questions clearly      Exercises General Exercises - Lower Extremity Long Arc Quad: AROM;Both;10 reps;Seated Hip Flexion/Marching: AROM;Both;10 reps;Seated Heel Raises: AROM;Both;10 reps;Seated    General Comments General comments (  skin integrity, edema, etc.): Pt with elevated BP upon arrival of PT (systolic of 810-175Z), BP of 200/73 following mobility and RN alerted, pt asymptomatic      Pertinent Vitals/Pain Pain Assessment: Faces Pain Score: 0-No pain Faces Pain Scale: No hurt Pain Intervention(s): Monitored  during session;Repositioned    Home Living Family/patient expects to be discharged to:: Private residence Living Arrangements: Spouse/significant other Available Help at Discharge: Family;Personal care attendant;Other (Comment);Available PRN/intermittently (2 separate PCA 7 days/week from ~12-6 pm) Type of Home: House Home Access: Stairs to enter Entrance Stairs-Rails: Right;Left;Can reach both Home Layout:  (stays on ML) Home Equipment: Walker - 2 wheels;Wheelchair - manual;Shower seat - built in;Bedside commode;Walker - 4 wheels      Prior Function Level of Independence: Needs assistance  Gait / Transfers Assistance Needed: Pt reports ambulating with RW or using a WC at baseline depending on needs that day. ADL's / Homemaking Assistance Needed: States he is generally requires at least some assist for ADL management. Has PCA 7 days/week that assists with house cleaning, medication management. Comments: Pt ambulating with RW or using w/c at baseline. Reports he needed some assist PTA but wife is not in great physical health either. Daughter can assist some.   PT Goals (current goals can now be found in the care plan section) Acute Rehab PT Goals Patient Stated Goal: get better, go home PT Goal Formulation: With patient Time For Goal Achievement: 01/14/20 Potential to Achieve Goals: Good Progress towards PT goals: Progressing toward goals    Frequency    Min 4X/week      PT Plan Discharge plan needs to be updated       AM-PAC PT "6 Clicks" Mobility   Outcome Measure  Help needed turning from your back to your side while in a flat bed without using bedrails?: None Help needed moving from lying on your back to sitting on the side of a flat bed without using bedrails?: A Little Help needed moving to and from a bed to a chair (including a wheelchair)?: A Little Help needed standing up from a chair using your arms (e.g., wheelchair or bedside chair)?: A Little Help needed to  walk in hospital room?: A Little Help needed climbing 3-5 steps with a railing? : A Little 6 Click Score: 19    End of Session Equipment Utilized During Treatment: Gait belt Activity Tolerance: Patient tolerated treatment well Patient left: in bed;with call bell/phone within reach Nurse Communication: Mobility status (BP) PT Visit Diagnosis: Unsteadiness on feet (R26.81);Other symptoms and signs involving the nervous system (W25.852)     Time: 7782-4235 PT Time Calculation (min) (ACUTE ONLY): 28 min  Charges:  $Gait Training: 23-37 mins                     Karma Ganja, PT, DPT   Acute Rehabilitation Department Pager #: [phone]   Otho Bellows 01/01/2020, 3:54 PM

## 2020-01-01 NOTE — Progress Notes (Signed)
Initial Nutrition Assessment  DOCUMENTATION CODES:   Not applicable  INTERVENTION:  Nepro Shake po daily, each supplement provides 425 kcal and 19 grams protein  Magic cup BID with meals, each supplement provides 290 kcal and 9 grams of protein  Double protein portions on breakfast on dinner trays  Ocuvite daily for wound healing (provides zinc, vitamin A, vitamin C, Vitamin E, copper, and selenium)  NUTRITION DIAGNOSIS:   Increased nutrient needs related to wound healing (diabetic ulcer to right middle toe with exposed bone) as evidenced by estimated needs.    GOAL:   Patient will meet greater than or equal to 90% of their needs    MONITOR:   PO intake, Supplement acceptance, Labs, I & O's, Skin, Weight trends  REASON FOR ASSESSMENT:   Malnutrition Screening Tool    ASSESSMENT:  RD working remotely.  86 year old male admitted for acute ischemic stroke likely secondary to intracranial atherosclerosis and hypoperfusion. Past medical history significant of CKD stage IIIb, CAD s/p stenting, DM, chronic dCHF, GERD, gout, HTN, HLD, dementia, PAD, prostate cancer, and multiple syncopal episodes presents with sudden onset of slurred speech, difficulty swallowing, and left-sided weakness.  Noted neurologically unchanged s/p tPA on 12/31/19 at [place]. Admitted to ICU for strict blood pressure control with clevidipine gtt, now with good control.   RD attempted to contact pt via phone, however no answer this afternoon.   Speech following, diet advanced to regular with nectar thick liquids, per flowsheet, he consumed 100% of lunch tray today. Patient has increased needs secondary to noted diabetic foot ulcer, will order Nepro daily, Magic Cup and 2x protein on meal trays to aid with meeting needs as well as Ocuvite to support wound healing.   Per chart, weights have fluctuated ~10 lbs in the last 2 months. Suspect fluctuations related to fluid shifts given CHF    Medications  reviewed and include: Aricept, Gabapentin, SSI, Imdur, Cleviprex, Namenda  IVF: NaCl @ 50 ml/hr  Labs: CBGs [phone] A1c 6.0 (12/31/19)  NUTRITION - FOCUSED PHYSICAL EXAM: Unable to complete at this time, RD working remotely.  Diet Order:   Diet Order            Diet regular Room service appropriate? Yes; Fluid consistency: Nectar Thick  Diet effective now                 EDUCATION NEEDS:   Not appropriate for education at this time  Skin:  Skin Assessment: Skin Integrity Issues: Skin Integrity Issues:: Diabetic Ulcer, Other (Comment) Diabetic Ulcer: right middle toe; bone exposed Other: MASD; medial sacrum; amputation;right third and fourth toes  Last BM:  11/18-type 7  Height:   Ht Readings from Last 1 Encounters:  12/30/19 5\' 7"  (1.702 m)    Weight:   Wt Readings from Last 1 Encounters:  12/30/19 72.6 kg    BMI:  There is no height or weight on file to calculate BMI.  Estimated Nutritional Needs:   Kcal:  2952-8413  Protein:  100-115  Fluid:  >1.8 L/day   Lajuan Lines, RD, LDN Clinical Nutrition After Hours/Weekend Pager # in [place]

## 2020-01-01 NOTE — Progress Notes (Signed)
This RN attempted to call CT regarding patient's 24-hour TPA follow up CT scan. Will call back in a few minutes and try again.

## 2020-01-02 DIAGNOSIS — I10 Essential (primary) hypertension: Secondary | ICD-10-CM | POA: Diagnosis not present

## 2020-01-02 LAB — BASIC METABOLIC PANEL
Anion gap: 10 (ref 5–15)
BUN: 24 mg/dL — ABNORMAL HIGH (ref 8–23)
CO2: 21 mmol/L — ABNORMAL LOW (ref 22–32)
Calcium: 7.7 mg/dL — ABNORMAL LOW (ref 8.9–10.3)
Chloride: 107 mmol/L (ref 98–111)
Creatinine, Ser: 1.69 mg/dL — ABNORMAL HIGH (ref 0.61–1.24)
GFR, Estimated: 39 mL/min — ABNORMAL LOW (ref >60)
Glucose, Bld: 155 mg/dL — ABNORMAL HIGH (ref 70–99)
Potassium: 3.7 mmol/L (ref 3.5–5.1)
Sodium: 138 mmol/L (ref 135–145)

## 2020-01-02 LAB — CBC
HCT: 27.2 % — ABNORMAL LOW (ref 39.0–52.0)
Hemoglobin: 8.7 g/dL — ABNORMAL LOW (ref 13.0–17.0)
MCH: 26.7 pg (ref 26.0–34.0)
MCHC: 32 g/dL (ref 30.0–36.0)
MCV: 83.4 fL (ref 80.0–100.0)
Platelets: 269 10*3/uL (ref 150–400)
RBC: 3.26 MIL/uL — ABNORMAL LOW (ref 4.22–5.81)
RDW: 17.3 % — ABNORMAL HIGH (ref 11.5–15.5)
WBC: 8.7 10*3/uL (ref 4.0–10.5)
nRBC: 0 % (ref 0.0–0.2)

## 2020-01-02 LAB — GLUCOSE, CAPILLARY
Glucose-Capillary: 137 mg/dL — ABNORMAL HIGH (ref 70–99)
Glucose-Capillary: 192 mg/dL — ABNORMAL HIGH (ref 70–99)
Glucose-Capillary: 178 mg/dL — ABNORMAL HIGH (ref 70–99)
Glucose-Capillary: 159 mg/dL — ABNORMAL HIGH (ref 70–99)

## 2020-01-02 MED ORDER — AMLODIPINE BESYLATE 5 MG PO TABS
5.0000 mg | ORAL_TABLET | Freq: Every day | ORAL | Status: DC
  Administered 2020-01-02 – 2020-01-03 (×2): 5 mg via ORAL
  Filled 2020-01-02: qty 1

## 2020-01-02 MED ORDER — INSULIN ASPART 100 UNIT/ML ~~LOC~~ SOLN
0.0000 [IU] | Freq: Three times a day (TID) | SUBCUTANEOUS | Status: DC
  Administered 2020-01-02 – 2020-01-03 (×2): 3 [IU] via SUBCUTANEOUS
  Administered 2020-01-03: 8 [IU] via SUBCUTANEOUS
  Administered 2020-01-03: 3 [IU] via SUBCUTANEOUS
  Administered 2020-01-04 (×2): 5 [IU] via SUBCUTANEOUS
  Administered 2020-01-05: 3 [IU] via SUBCUTANEOUS
  Administered 2020-01-05 (×2): 5 [IU] via SUBCUTANEOUS
  Administered 2020-01-06: 11 [IU] via SUBCUTANEOUS
  Administered 2020-01-06: 3 [IU] via SUBCUTANEOUS
  Administered 2020-01-07 (×2): 2 [IU] via SUBCUTANEOUS
  Administered 2020-01-07: 3 [IU] via SUBCUTANEOUS
  Administered 2020-01-08 – 2020-01-10 (×4): 2 [IU] via SUBCUTANEOUS
  Administered 2020-01-10: 5 [IU] via SUBCUTANEOUS

## 2020-01-02 MED ORDER — INSULIN ASPART 100 UNIT/ML ~~LOC~~ SOLN
0.0000 [IU] | Freq: Every day | SUBCUTANEOUS | Status: DC
  Administered 2020-01-03 – 2020-01-04 (×2): 2 [IU] via SUBCUTANEOUS
  Administered 2020-01-05: 3 [IU] via SUBCUTANEOUS
  Administered 2020-01-06 – 2020-01-08 (×2): 2 [IU] via SUBCUTANEOUS

## 2020-01-02 MED ORDER — HYDRALAZINE HCL 25 MG PO TABS
25.0000 mg | ORAL_TABLET | Freq: Three times a day (TID) | ORAL | Status: DC
  Administered 2020-01-02 – 2020-01-03 (×3): 25 mg via ORAL
  Filled 2020-01-02 (×3): qty 1

## 2020-01-02 NOTE — Progress Notes (Signed)
°  Speech Language Pathology Treatment: Dysphagia  Patient Details Name: Peter [name] MRN: [id number] DOB: Jun 29, 1930 Today's Date: 01/02/2020 Time: 2641-5830 SLP Time Calculation (min) (ACUTE ONLY): 15 min  Assessment / Plan / Recommendation Clinical Impression  ST follow up for therapeutic diet tolerance following MBS completed yesterday. The patient was observed to aspirate with a delayed cough response given thin liquids during that exam.  RN reported that the night nurse was concerned for aspiration but that his lungs were clear this AM.  He was also afebrile.  The patient was seen sitting upright in bed.  Vocal quality was slightly gurgly suggesting issues with management of his secretions.  Patient required cues to recall all suggested safe swallow precautions including double swallows and frequent throat clearing.  Meal observation was completed using nectar thick liquids via self fed cup sips and dry solids.  The patient was noted to cough on the liquids when he would drink while food was in his mouth.  Suggested to him to wait to drink until he had cleared all food from his oral cavity. RN was also notified of this suggestion.  ST will continue to follow during acute stay. He will require intense post acute ST to address deficits.     HPI HPI: Pt is an 86 yo male presenting with acute onset of fluctuating slurred speech, L sided weakness, and difficulty swallowing s/p tPA. MRI concerning for R MCA territory ischemia. CTA revealed a small amount of secretions within the proximal R mainstem bronchus, suggestive of aspiration risk. PMH includes: prostate ca, PAD, MI, mild dementia, HTN, HLD, gout, GERD, DM, CAD, CKD      SLP Plan  Continue with current plan of care       Recommendations  Diet recommendations: Regular;Nectar-thick liquid Liquids provided via: Cup;No straw Medication Administration: Whole meds with puree Supervision: Patient able to self feed Compensations: Slow  rate;Small sips/bites Postural Changes and/or Swallow Maneuvers: Seated upright 90 degrees;Upright 30-60 min after meal                Oral Care Recommendations: Oral care BID Follow up Recommendations: 24 hour supervision/assistance;Inpatient Rehab SLP Visit Diagnosis: Dysphagia, oropharyngeal phase (R13.12) Plan: Continue with current plan of care       [place], [place], CCC-SLP Acute Rehab SLP [phone]  Lamar Sprinkles 01/02/2020, 12:49 PM
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
## 2020-01-02 NOTE — Progress Notes (Signed)
STROKE TEAM PROGRESS NOTE   INTERVAL HISTORY The patient is progress stroke TPA and has done well but continues to have elevated blood pressures requiring IV medications.  We have started one of his medication this morning and will start also the amlodipine at half the dose to try to reduce and wean off the IV blood pressure medication.   OBJECTIVE Vitals:   01/02/20 0745 01/02/20 0800 01/02/20 0815 01/02/20 0830  BP: 105/83 (!) 151/137 (!) 152/88 (!) 167/45  Pulse: 79 86 81 78  Resp: (!) 23 (!) 21 20 (!) 23  Temp:      TempSrc:      SpO2: 98% 92% 94% (!) 89%    CBC:  Recent Labs  Lab 12/30/19 2315 12/30/19 2324 01/01/20 0729 01/02/20 0408  WBC 7.0   < > 8.5 8.7  NEUTROABS 4.9  --   --   --   HGB 8.7*   < > 9.2* 8.7*  HCT 27.9*   < > 28.8* 27.2*  MCV 85.3   < > 83.7 83.4  PLT 235   < > 270 269   < > = values in this interval not displayed.    Basic Metabolic Panel:  Recent Labs  Lab 01/01/20 0729 01/02/20 0408  NA 139 138  K 3.6 3.7  CL 109 107  CO2 21* 21*  GLUCOSE 123* 155*  BUN 18 24*  CREATININE 1.47* 1.69*  CALCIUM 8.0* 7.7*    Lipid Panel:     Component Value Date/Time   CHOL 85 12/31/2019 0318   TRIG 152 (H) 12/31/2019 0318   HDL 44 12/31/2019 0318   CHOLHDL 1.9 12/31/2019 0318   VLDL 30 12/31/2019 0318   LDLCALC 11 12/31/2019 0318   HgbA1c:  Lab Results  Component Value Date   HGBA1C 6.0 (H) 12/31/2019   Urine Drug Screen:     Component Value Date/Time   LABOPIA NONE DETECTED 12/31/2019 1806   COCAINSCRNUR NONE DETECTED 12/31/2019 1806   LABBENZ NONE DETECTED 12/31/2019 1806   AMPHETMU NONE DETECTED 12/31/2019 1806   THCU NONE DETECTED 12/31/2019 1806   LABBARB NONE DETECTED 12/31/2019 1806    Alcohol Level     Component Value Date/Time   ETH <10 12/30/2019 2315    IMAGING  CT Code Stroke CTA Head W/WO contrast CT Code Stroke CTA Neck W/WO contrast 12/31/2019 IMPRESSION:  1. Negative CTA for emergent large vessel occlusion.   2. Approximate 8-9 mm severe near occlusive stenosis of the proximal right M1 segment. Right MCA branches attenuated but patent distally with no visible downstream occlusion.  3. Additional severe proximal right A1 stenosis.  4. Atheromatous change about the carotid bifurcations with associated stenoses of up to 50% on the right and 45% on the left.  5. Atheromatous change about the right carotid siphon with associated moderate to severe multifocal stenoses.  6. Small amount of secretions within the proximal right mainstem bronchus, suggesting that this patient is at risk for aspiration.   MR BRAIN WO CONTRAST 12/31/2019 IMPRESSION:  1. Patchy and hazy diffusion abnormality involving the right insular cortex, subinsular white matter, and overlying right frontal operculum, consistent with acute right MCA territory ischemia. No associated hemorrhage or mass effect. Finding is consistent with the previously identified severe right M1 stenosis.  2. No other acute intracranial abnormality.  3. Age-related cerebral atrophy with mild chronic small vessel ischemic disease.   CT HEAD CODE STROKE WO CONTRAST 12/30/2019 IMPRESSION:  1. No acute intracranial infarct or  other abnormality.  2. ASPECTS is 10.  3. Age-appropriate cerebral atrophy with mild chronic small vessel ischemic disease.   CT HEAD WO CONTRAST - post tPA 01/01/20 IMPRESSION: 1. Patchy evolving small volume acute ischemic infarcts involving the right subinsular white matter, adjacent insular cortex, and overlying operculum, corresponding with findings on prior MRI. No interval hemorrhage, mass effect, or other complication. 2. No other new acute intracranial abnormality.  DG Chest 2 View 12/30/2019 IMPRESSION:  1. Stable cardiomegaly. No pulmonary venous congestion.  2. Low lung volumes.   Transthoracic Echocardiogram  1. Left ventricular ejection fraction, by estimation, is 60 to 65%. The  left ventricle has normal  function. The left ventricle has no regional  wall motion abnormalities. There is mild concentric left ventricular  hypertrophy. Left ventricular diastolic  parameters are consistent with Grade II diastolic dysfunction  (pseudonormalization). Elevated left atrial pressure. The E/e' is 69.  2. Right ventricular systolic function is normal. The right ventricular  size is normal. Tricuspid regurgitation signal is inadequate for assessing  PA pressure.  3. Left atrial size was mildly dilated.  4. The mitral valve is degenerative. No evidence of mitral valve  regurgitation. No evidence of mitral stenosis.  5. The aortic valve is tricuspid. Aortic valve regurgitation is not  visualized. No aortic stenosis is present.  6. The inferior vena cava is normal in size with greater than 50%  respiratory variability, suggesting right atrial pressure of 3 mmHg.  ECG - SR rate 70 BPM. (See cardiology reading for complete details)  PHYSICAL EXAM   Temp:  [96.7 F (35.9 C)-99.4 F (37.4 C)] 99.4 F (37.4 C) (11/21 0400) Pulse Rate:  [64-86] 78 (11/21 0830) Resp:  [11-27] 23 (11/21 0830) BP: (105-190)/(40-137) 167/45 (11/21 0830) SpO2:  [89 %-100 %] 89 % (11/21 0830)  General - Well nourished, well developed, in no apparent distress.  Ophthalmologic - fundi not visualized due to noncooperation.  Cardiovascular - Regular rhythm and rate.  Mental Status -  Level of arousal and orientation to time, place, and person were intact.  There is mild to moderate dysarthria noted. Language including expression, naming, repetition, comprehension was assessed and found intact.  Cranial Nerves II - XII - II - Visual field intact OU. III, IV, VI - Extraocular movements intact. V - Facial sensation intact bilaterally. VII - mild left facial droop. VIII - Hearing & vestibular intact bilaterally. X - Palate elevates symmetrically. XI - Chin turning & shoulder shrug intact bilaterally. XII - Tongue  protrusion intact.  Motor Strength - The patient's strength was normal in all extremities except mild left UE pronator drift.  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  There is marked global atrophy of the intrinsic hand muscles on the right and mild on the left.  These are associated with weakness of the hand muscles bilaterally. Motor Tone - Muscle tone was assessed at the neck and appendages and was normal.  Reflexes - The patient's reflexes were symmetrical in all extremities and he had no pathological reflexes.  Sensory - Light touch, temperature/pinprick were assessed and were symmetrical.    Coordination - The patient had normal movements in the hands with no ataxia or dysmetria.  Tremor was absent.  Gait and Station - deferred.   ASSESSMENT/PLAN Mr. Cosby Proby is a 86 y.o. male with history of coronary artery disease, CKD  stage III, dementia, diabetes, diastolic dysfunction, peripheral vascular disease, prostate cancer, history of multiple syncopal episodes, presented to the emergency room for evaluation of sudden onset of slurred speech, difficulty swallowing and left-sided weakness. He was scheduled for a GI scoping today, but could not go ahead with the procedure because of severely elevated blood pressures with systolic in the 623J.  His Plavix was on hold for procedure.  The patient received IV t-PA Friday 12/31/19 at [facility].  Stroke: acute right MCA pachy infarcts with right M1 occlusion s/p tPA - cardioembolic vs. Large vessel disease source  CT Head - No acute intracranial infarct or other abnormality. ASPECTS is 10.   CTA H&N - Approximate 8-9 mm severe near occlusive stenosis of the proximal right M1 segment. Right MCA branches attenuated but patent distally with no visible downstream occlusion. Additional severe proximal right A1 stenosis. ICA  stenosis up to 50% on the right and 45% on the left. Atheromatous change about the right carotid siphon with associated moderate to severe multifocal stenoses.  MRI head - Patchy and hazy diffusion abnormality involving the right insular cortex, subinsular white matter, and overlying right frontal operculum, consistent with acute right MCA territory ischemia. No associated hemorrhage or mass effect. Finding is consistent with the previously identified severe right M1 stenosis.   CT head repeat 24h post tPA - Patchy evolving small volume acute ischemic infarcts involving the right subinsular white matter, adjacent insular cortex, and overlying operculum, corresponding with findings on prior MRI. No interval hemorrhage  2D Echo - EF 60 - 65%. No cardiac source of emboli identified.   Consider 30 day cardiac event monitoring as outpt to rule out afib  [employer] Virus 2 - negative  LDL - 11  HgbA1c - 6.0  UDS neg  VTE prophylaxis - SCDs  aspirin 81 mg daily and clopidogrel 75 mg daily (Plavix held 5 days for colonoscopy) prior to admission, now back on Aspirin 81 mg daily and Plavix 75 mg daily restarted 01/01/20  Patient counseled to be compliant with his antithrombotic medications  Ongoing aggressive stroke risk factor management  Therapy recommendations:  Turner placement recommended by therapists  Disposition:  Pending  CAD s/p stent Elevated troponin  Troponin 35->50->215>209  EKG no ST-T change  TTE - EF 60 - 65%. No cardiac source of emboli identified.   Cardiology on board  On DAPT PTA  Will restart DAPT 24h post tPA if no bleeding  CT head repeat 24h post tPA 11/20 2:25 - Patchy evolving small volume acute ischemic infarcts involving the right subinsular white matter, adjacent insular cortex, and overlying operculum, corresponding with findings on prior MRI. No interval hemorrhage  Will start DAPT as above  Hypertension  Home BP meds: Cozaar  ; amlodipine ; HCTZ ; Coreg ; hydralazine ; Cardura  Current BP meds: Coreg and Norvasc resumed  On cleviprex - taper off as able  Stable ( SBP 105 - 160's 01/02/20) . BP < 180/105 post tPA . Long-term BP goal normotensive  Hyperlipidemia  Home Lipid lowering medication: Lipitor 80 mg daily  LDL 11, goal < 70  Current lipid lowering medication: decrease to lipitor 40   Continue statin at discharge  Diabetes  Home diabetic meds: insulin  Current diabetic meds: SSI   HgbA1c 6.0, goal < 7.0  Controlled  CBG monitoring  Follow up with PCP  Other Stroke Risk Factors  Advanced age  Former cigarette smoker - quit  ETOH use, advised to drink  no more than 1 alcoholic beverage per day.  Hx stroke/TIA  Coronary artery disease  Dysphagia   Speech on board  Passed MBS  On dys3 and thin  Gentle hydration  Other Active Problems  Code status - DNR   Dementia on Aricept and nemanda - continue   Hx of multiple syncopal episodes CKD - stage 3a - Creatinine - 1.30->1.47->1.69 Anemia of chronic disease - Hgb - 9.5->8.7->9.2->8.7 Liquid stool reported - C-diff quick screen ordered 01/01/20 - pending Hypocalcemia - Calcium - 8.0->8.0->7.7  Hospital day # 2  This patient is critically ill due to stroke post tPA and at significant risk of neurological worsening, death form severe anemia, bleeding, recurrent stroke, intracranial stenosis. This patient's care requires constant monitoring of vital signs, hemodynamics, respiratory and cardiac monitoring, review of multiple databases, neurological assessment, other specialists and medical decision making of high complexity. I spent 35 minutes of neurocritical care time in the care of this patient.  We will continue with the IV medication for blood pressure control but weaned as appropriate.      To contact Stroke Continuity provider, please refer to [email or web address] After hours, contact General Neurology

## 2020-01-03 ENCOUNTER — Ambulatory Visit (INDEPENDENT_AMBULATORY_CARE_PROVIDER_SITE_OTHER): Payer: Medicare Other | Admitting: Vascular Surgery

## 2020-01-03 ENCOUNTER — Other Ambulatory Visit: Payer: Self-pay | Admitting: Physician Assistant

## 2020-01-03 DIAGNOSIS — I160 Hypertensive urgency: Secondary | ICD-10-CM | POA: Diagnosis not present

## 2020-01-03 DIAGNOSIS — I639 Cerebral infarction, unspecified: Secondary | ICD-10-CM

## 2020-01-03 LAB — CBC
HCT: 26.5 % — ABNORMAL LOW (ref 39.0–52.0)
Hemoglobin: 8.5 g/dL — ABNORMAL LOW (ref 13.0–17.0)
MCH: 26.4 pg (ref 26.0–34.0)
MCHC: 32.1 g/dL (ref 30.0–36.0)
MCV: 82.3 fL (ref 80.0–100.0)
Platelets: 273 10*3/uL (ref 150–400)
RBC: 3.22 MIL/uL — ABNORMAL LOW (ref 4.22–5.81)
RDW: 17.7 % — ABNORMAL HIGH (ref 11.5–15.5)
WBC: 10.1 10*3/uL (ref 4.0–10.5)
nRBC: 0 % (ref 0.0–0.2)

## 2020-01-03 LAB — BASIC METABOLIC PANEL
Anion gap: 7 (ref 5–15)
BUN: 32 mg/dL — ABNORMAL HIGH (ref 8–23)
CO2: 23 mmol/L (ref 22–32)
Calcium: 7.7 mg/dL — ABNORMAL LOW (ref 8.9–10.3)
Chloride: 108 mmol/L (ref 98–111)
Creatinine, Ser: 2.3 mg/dL — ABNORMAL HIGH (ref 0.61–1.24)
GFR, Estimated: 27 mL/min — ABNORMAL LOW (ref >60)
Glucose, Bld: 203 mg/dL — ABNORMAL HIGH (ref 70–99)
Potassium: 4.1 mmol/L (ref 3.5–5.1)
Sodium: 138 mmol/L (ref 135–145)

## 2020-01-03 LAB — GLUCOSE, CAPILLARY
Glucose-Capillary: 156 mg/dL — ABNORMAL HIGH (ref 70–99)
Glucose-Capillary: 227 mg/dL — ABNORMAL HIGH (ref 70–99)
Glucose-Capillary: 137 mg/dL — ABNORMAL HIGH (ref 70–99)

## 2020-01-03 MED ORDER — AMLODIPINE BESYLATE 5 MG PO TABS
5.0000 mg | ORAL_TABLET | Freq: Once | ORAL | Status: AC
  Administered 2020-01-03: 5 mg via ORAL

## 2020-01-03 MED ORDER — TICAGRELOR 90 MG PO TABS
90.0000 mg | ORAL_TABLET | Freq: Two times a day (BID) | ORAL | Status: DC
  Administered 2020-01-03 – 2020-01-10 (×14): 90 mg via ORAL
  Filled 2020-01-03 (×14): qty 1

## 2020-01-03 MED ORDER — AMLODIPINE BESYLATE 10 MG PO TABS
10.0000 mg | ORAL_TABLET | Freq: Every day | ORAL | Status: DC
  Filled 2020-01-03: qty 1

## 2020-01-03 MED ORDER — LABETALOL HCL 5 MG/ML IV SOLN
5.0000 mg | INTRAVENOUS | Status: DC | PRN
  Administered 2020-01-03 (×2): 5 mg via INTRAVENOUS
  Filled 2020-01-03 (×3): qty 4

## 2020-01-03 MED ORDER — DOXAZOSIN MESYLATE 4 MG PO TABS
4.0000 mg | ORAL_TABLET | Freq: Every day | ORAL | Status: DC
  Administered 2020-01-03: 4 mg via ORAL
  Filled 2020-01-03 (×2): qty 1

## 2020-01-03 MED ORDER — HYDRALAZINE HCL 50 MG PO TABS
50.0000 mg | ORAL_TABLET | Freq: Three times a day (TID) | ORAL | Status: DC
  Administered 2020-01-03 (×2): 50 mg via ORAL
  Filled 2020-01-03 (×2): qty 1

## 2020-01-03 MED ORDER — DOCUSATE SODIUM 100 MG PO CAPS
100.0000 mg | ORAL_CAPSULE | Freq: Every day | ORAL | Status: DC
  Administered 2020-01-04 – 2020-01-10 (×7): 100 mg via ORAL
  Filled 2020-01-03 (×8): qty 1

## 2020-01-03 NOTE — Plan of Care (Signed)
  Problem: Self-Care: Goal: Verbalization of feelings and concerns over difficulty with self-care will improve Outcome: Progressing

## 2020-01-03 NOTE — Progress Notes (Signed)
Physical Therapy Treatment Patient Details Name: Peter [name] MRN: [id number] DOB: [birth year] Today's Date: 01/03/2020    History of Present Illness Pt is an 86 yo male presenting with acute onset of fluctuating slurred speech, L sided weakness, and difficulty swallowing s/p tPA. MRI concerning for R MCA territory ischemia. CTA revealed a small amount of secretions within the proximal R mainstem bronchus, suggestive of aspiration risk. PMH includes: prostate ca, PAD, MI, mild dementia, HTN, HLD, gout, GERD, DM, CAD, CKD     PT Comments    The pt remains highly motivated and engaged in PT sessions, but was noted to have slight decrease in functional strength, power, and stability from prior session (Saturday 11/20). Per RN staff the pt required max/totalA of 2 to complete pivot transfers this morning, when he had been transferring without use of AD on supervision level at last session. The pt was able to complete multiple transfers requiring min/modA to power up and steady with use of RW, and did show improvement with reps through session. The pt was also able to complete 3 bouts of 75 ft ambulation with seated rest between to increase reps of sit-stand transfers practiced through today's session. Vitals remained stable today, and pt denied sx with all mobility. The pt was able to mobilize with minG/minA to steady with use of RW for gait and transfers, and will still be safe to d/c home with family support or aides present for 24/7 supervision and assist. The pt remains in agreement with this plan, and will continue to benefit from skilled PT acutely to maintain mobility and following d/c.     Follow Up Recommendations  Home health PT;Supervision for mobility/OOB (discussed 24/7 supervision for a few days)     Equipment Recommendations  None recommended by PT    Recommendations for Other Services       Precautions / Restrictions Precautions Precautions: Fall Restrictions Weight Bearing  Restrictions: No    Mobility  Bed Mobility Overal bed mobility: Needs Assistance Bed Mobility: Sit to Supine       Sit to supine: Min guard   General bed mobility comments: increased effort to bring LEs into bed, no assist required   Transfers Overall transfer level: Needs assistance Equipment used: Rolling walker (2 wheeled) Transfers: Sit to/from Stand Sit to Stand: Mod assist;Min assist;+2 safety/equipment         General transfer comment: modA initially progressed to minA as session continued - increased time/cues for transition of UEs to RW. performed multiple sit<>stands during session   Ambulation/Gait Ambulation/Gait assistance: Min guard Gait Distance (Feet): 75 Feet (x3) Assistive device: Rolling walker (2 wheeled) Gait Pattern/deviations: Step-through pattern;Decreased stride length;Trunk flexed     General Gait Details: slightly shortened stride with no lateral or posterior instability noted. cues for positioning in RW at times but pt able to maintain well    Modified Rankin (Stroke Patients Only) Modified Rankin (Stroke Patients Only) Pre-Morbid Rankin Score: No significant disability Modified Rankin: Moderately severe disability     Balance Overall balance assessment: Needs assistance Sitting-balance support: No upper extremity supported;Feet supported Sitting balance-Leahy Scale: Fair Sitting balance - Comments: no support needed   Standing balance support: Single extremity supported;During functional activity Standing balance-Leahy Scale: Poor Standing balance comment: pt able to don mask with minA to steady, BUE support for gait                            Cognition Arousal/Alertness:  Awake/alert Behavior During Therapy: WFL for tasks assessed/performed Overall Cognitive Status: History of cognitive impairments - at baseline                                 General Comments: hx of mild dementia; overall appears  appropriate today for basic tasks       Exercises      General Comments General comments (skin integrity, edema, etc.): BP generally below parameter of systolic <967 even with mobility, pt denies sx, bilateral hands with new edema since Saturday      Pertinent Vitals/Pain Pain Assessment: No/denies pain Faces Pain Scale: No hurt Pain Intervention(s): Monitored during session           PT Goals (current goals can now be found in the care plan section) Acute Rehab PT Goals Patient Stated Goal: get better, go home PT Goal Formulation: With patient Time For Goal Achievement: 01/14/20 Potential to Achieve Goals: Good Progress towards PT goals: Progressing toward goals    Frequency    Min 4X/week      PT Plan Current plan remains appropriate    Co-evaluation PT/OT/SLP Co-Evaluation/Treatment: Yes Reason for Co-Treatment: For patient/therapist safety;To address functional/ADL transfers (Per RN reports pt had experienced sig decline in mobility) PT goals addressed during session: Mobility/safety with mobility;Balance;Proper use of DME;Strengthening/ROM OT goals addressed during session: ADL's and self-care      AM-PAC PT "6 Clicks" Mobility   Outcome Measure  Help needed turning from your back to your side while in a flat bed without using bedrails?: None Help needed moving from lying on your back to sitting on the side of a flat bed without using bedrails?: A Little Help needed moving to and from a bed to a chair (including a wheelchair)?: A Little Help needed standing up from a chair using your arms (e.g., wheelchair or bedside chair)?: A Little Help needed to walk in hospital room?: A Little Help needed climbing 3-5 steps with a railing? : A Little 6 Click Score: 19    End of Session Equipment Utilized During Treatment: Gait belt Activity Tolerance: Patient tolerated treatment well Patient left: in bed;with call bell/phone within reach Nurse Communication:  Mobility status PT Visit Diagnosis: Unsteadiness on feet (R26.81);Other symptoms and signs involving the nervous system (R29.898)     Time: 1500-1540 PT Time Calculation (min) (ACUTE ONLY): 40 min  Charges:  $Gait Training: 8-22 mins                     Karma Ganja, PT, DPT   Acute Rehabilitation Department Pager #: [phone]   Otho Bellows 01/03/2020, 5:48 PM
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
## 2020-01-03 NOTE — Progress Notes (Signed)
  Speech Language Pathology Treatment: Dysphagia  Patient Details Name: Peter [name] MRN: [id number] DOB: 18-Oct-1930 Today's Date: 01/03/2020 Time: 7972-8206 SLP Time Calculation (min) (ACUTE ONLY): 10 min  Assessment / Plan / Recommendation Clinical Impression  Pt was seen to address swallowing goals. Pt does not have a wet vocal quality today, as was noted on previous date, and there is no coughing observed during PO intake. Pt can recall participation in [place] over the weekend but is not sure why he is on nectar thick liquids. Education was provided about oropharyngeal function, including aspiration of thin liquids. SLP also provided Min-Mod cues for use of strategies from MBS including multiple swallows and use of throat clearing. In subsequent sessions, will plan to initiate more exercises to work on progressing pt back toward thin liquids as able.   HPI HPI: Pt is an 86 yo male presenting with acute onset of fluctuating slurred speech, L sided weakness, and difficulty swallowing s/p tPA. MRI concerning for R MCA territory ischemia. CTA revealed a small amount of secretions within the proximal R mainstem bronchus, suggestive of aspiration risk. PMH includes: prostate ca, PAD, MI, mild dementia, HTN, HLD, gout, GERD, DM, CAD, CKD      SLP Plan  Continue with current plan of care       Recommendations  Diet recommendations: Regular;Nectar-thick liquid Liquids provided via: Cup;No straw Medication Administration: Whole meds with puree Supervision: Patient able to self feed Compensations: Slow rate;Small sips/bites Postural Changes and/or Swallow Maneuvers: Seated upright 90 degrees;Upright 30-60 min after meal                Oral Care Recommendations: Oral care BID Follow up Recommendations: 24 hour supervision/assistance;Inpatient Rehab SLP Visit Diagnosis: Dysphagia, unspecified (R13.10) Plan: Continue with current plan of care       GO                Osie Bond.,  M.A. [place] Acute Rehabilitation Services Pager [phone] Office [phone]  01/03/2020, 12:30 PM

## 2020-01-03 NOTE — Plan of Care (Signed)
?  Problem: Nutrition: ?Goal: Dietary intake will improve ?Outcome: Progressing ?  ?

## 2020-01-03 NOTE — Progress Notes (Addendum)
STROKE TEAM PROGRESS NOTE   INTERVAL HISTORY Patient is lying comfortably in bed.  He states he is recovered very well.  Blood pressure adequately controlled.  No new complaints.     OBJECTIVE Vitals:   01/03/20 1330 01/03/20 1345 01/03/20 1400 01/03/20 1430  BP: (!) 152/57 (!) 159/50 (!) 147/46 (!) 122/95  Pulse: 65 70 72 66  Resp: 19 (!) 22 (!) 25 19  Temp:      TempSrc:      SpO2: 96% 97% 95% 96%    CBC:  Recent Labs  Lab 12/30/19 2315 12/30/19 2324 01/02/20 0408 01/03/20 0147  WBC 7.0   < > 8.7 10.1  NEUTROABS 4.9  --   --   --   HGB 8.7*   < > 8.7* 8.5*  HCT 27.9*   < > 27.2* 26.5*  MCV 85.3   < > 83.4 82.3  PLT 235   < > 269 273   < > = values in this interval not displayed.    Basic Metabolic Panel:  Recent Labs  Lab 01/02/20 0408 01/03/20 0147  NA 138 138  K 3.7 4.1  CL 107 108  CO2 21* 23  GLUCOSE 155* 203*  BUN 24* 32*  CREATININE 1.69* 2.30*  CALCIUM 7.7* 7.7*    Lipid Panel:     Component Value Date/Time   CHOL 85 12/31/2019 0318   TRIG 152 (H) 12/31/2019 0318   HDL 44 12/31/2019 0318   CHOLHDL 1.9 12/31/2019 0318   VLDL 30 12/31/2019 0318   LDLCALC 11 12/31/2019 0318   HgbA1c:  Lab Results  Component Value Date   HGBA1C 6.0 (H) 12/31/2019   Urine Drug Screen:     Component Value Date/Time   LABOPIA NONE DETECTED 12/31/2019 1806   COCAINSCRNUR NONE DETECTED 12/31/2019 1806   LABBENZ NONE DETECTED 12/31/2019 1806   AMPHETMU NONE DETECTED 12/31/2019 1806   THCU NONE DETECTED 12/31/2019 1806   LABBARB NONE DETECTED 12/31/2019 1806    Alcohol Level     Component Value Date/Time   ETH <10 12/30/2019 2315    IMAGING  CT Code Stroke CTA Head W/WO contrast CT Code Stroke CTA Neck W/WO contrast 12/31/2019 IMPRESSION:  1. Negative CTA for emergent large vessel occlusion.  2. Approximate 8-9 mm severe near occlusive stenosis of the proximal right M1 segment. Right MCA branches attenuated but patent distally with no visible  downstream occlusion.  3. Additional severe proximal right A1 stenosis.  4. Atheromatous change about the carotid bifurcations with associated stenoses of up to 50% on the right and 45% on the left.  5. Atheromatous change about the right carotid siphon with associated moderate to severe multifocal stenoses.  6. Small amount of secretions within the proximal right mainstem bronchus, suggesting that this patient is at risk for aspiration.   MR BRAIN WO CONTRAST 12/31/2019 IMPRESSION:  1. Patchy and hazy diffusion abnormality involving the right insular cortex, subinsular white matter, and overlying right frontal operculum, consistent with acute right MCA territory ischemia. No associated hemorrhage or mass effect. Finding is consistent with the previously identified severe right M1 stenosis.  2. No other acute intracranial abnormality.  3. Age-related cerebral atrophy with mild chronic small vessel ischemic disease.   CT HEAD CODE STROKE WO CONTRAST 12/30/2019 IMPRESSION:  1. No acute intracranial infarct or other abnormality.  2. ASPECTS is 10.  3. Age-appropriate cerebral atrophy with mild chronic small vessel ischemic disease.   CT HEAD WO CONTRAST - post tPA  01/01/20 IMPRESSION: 1. Patchy evolving small volume acute ischemic infarcts involving the right subinsular white matter, adjacent insular cortex, and overlying operculum, corresponding with findings on prior MRI. No interval hemorrhage, mass effect, or other complication. 2. No other new acute intracranial abnormality.  DG Chest 2 View 12/30/2019 IMPRESSION:  1. Stable cardiomegaly. No pulmonary venous congestion.  2. Low lung volumes.   Transthoracic Echocardiogram  1. Left ventricular ejection fraction, by estimation, is 60 to 65%. The  left ventricle has normal function. The left ventricle has no regional  wall motion abnormalities. There is mild concentric left ventricular  hypertrophy. Left ventricular diastolic   parameters are consistent with Grade II diastolic dysfunction  (pseudonormalization). Elevated left atrial pressure. The E/e' is 72.  2. Right ventricular systolic function is normal. The right ventricular  size is normal. Tricuspid regurgitation signal is inadequate for assessing  PA pressure.  3. Left atrial size was mildly dilated.  4. The mitral valve is degenerative. No evidence of mitral valve  regurgitation. No evidence of mitral stenosis.  5. The aortic valve is tricuspid. Aortic valve regurgitation is not  visualized. No aortic stenosis is present.  6. The inferior vena cava is normal in size with greater than 50%  respiratory variability, suggesting right atrial pressure of 3 mmHg.  ECG - SR rate 70 BPM. (See cardiology reading for complete details)  PHYSICAL EXAM   Temp:  [97.3 F (36.3 C)-99.1 F (37.3 C)] 97.3 F (36.3 C) (11/22 1234) Pulse Rate:  [60-78] 66 (11/22 1430) Resp:  [0-25] 19 (11/22 1430) BP: (122-190)/(29-151) 122/95 (11/22 1430) SpO2:  [91 %-98 %] 96 % (11/22 1430)  General - Well nourished, well developed, in no apparent distress.  Ophthalmologic - fundi not visualized due to noncooperation.  Cardiovascular - Regular rhythm and rate.  Mental Status -  Level of arousal and orientation to time, place, and person were intact.  There is mild to moderate dysarthria noted. Language including expression, naming, repetition, comprehension was assessed and found intact.  Cranial Nerves II - XII - II - Visual field intact OU. III, IV, VI - Extraocular movements intact. V - Facial sensation intact bilaterally. VII - mild left facial droop. VIII - Hearing & vestibular intact bilaterally. X - Palate elevates symmetrically. XI - Chin turning & shoulder shrug intact bilaterally. XII - Tongue protrusion intact.  Motor Strength - The patient's strength was normal in all extremities except mild left UE pronator drift.  There is clawhand deformity of the  hands bilaterally more profound on the right side Likely from prior ulnar nerve damage at the elbow.  There is marked global atrophy of the intrinsic hand muscles on the right and mild on the left.  These are associated with weakness of the hand muscles bilaterally. Motor Tone - Muscle tone was assessed at the neck and was normal.  Reflexes - The patient's reflexes were symmetrical in all extremities and he had no pathological reflexes.  Sensory - Light touch, temperature/pinprick were assessed and were symmetrical.    Coordination - The patient had normal movements in the hands with no ataxia or dysmetria on right side. Mild left sided ataxia.   Gait and Station - deferred.   ASSESSMENT/PLAN [name] is a 86 y.o. male with history of coronary artery disease, CKD stage III, dementia, diabetes, diastolic dysfunction, peripheral vascular disease, prostate cancer, history of multiple syncopal episodes, presented to the emergency room for evaluation of sudden onset of slurred speech, difficulty swallowing and left-sided  weakness. He was scheduled for a GI scoping today, but could not go ahead with the procedure because of severely elevated blood pressures with systolic in the 295A.  His Plavix was on hold for procedure.  The patient received IV t-PA Friday 12/31/19 at [place].  Stroke: acute right MCA pachy infarcts with right M1 occlusion s/p tPA - cardioembolic vs. Large vessel disease source     CT Head - No acute intracranial infarct or other abnormality. ASPECTS is 10.   CTA H&N - Approximate 8-9 mm severe near occlusive stenosis of the proximal right M1 segment. Right MCA branches attenuated but patent distally with no visible downstream occlusion. Additional severe proximal right A1 stenosis. ICA stenosis up to 50% on the right and 45% on the left. Atheromatous change about the right carotid siphon with associated moderate to severe multifocal stenoses.  MRI head - Patchy and hazy  diffusion abnormality involving the right insular cortex, subinsular white matter, and overlying right frontal operculum, consistent with acute right MCA territory ischemia. No associated hemorrhage or mass effect. Finding is consistent with the previously identified severe right M1 stenosis.   CT head repeat 24h post tPA - Patchy evolving small volume acute ischemic infarcts involving the right subinsular white matter, adjacent insular cortex, and overlying operculum, corresponding with findings on prior MRI. No interval hemorrhage  2D Echo - EF 60 - 65%. No cardiac source of emboli identified.   Consider 30 day cardiac event monitoring as outpt to rule out afib  [employer] Virus 2 - negative  LDL - 11  HgbA1c - 6.0  UDS neg  VTE prophylaxis - SCDs  aspirin 81 mg daily and clopidogrel 75 mg daily (Plavix held 5 days for colonoscopy) prior to admission, now back on Aspirin 81 mg daily and Plavix 75 mg daily restarted 01/01/20. Plan to change plavix to brillinta if patient can afford it. Pt will be on aspirin 81mg  and brillinta for 3 months, then discontinue aspirin and continue only with brillinta.   Patient counseled to be compliant with his antithrombotic medications  Ongoing aggressive stroke risk factor management  Therapy recommendations:  [place] placement recommended by therapists  Disposition:  Pending  CAD s/p stent Elevated troponin  Troponin 35->50->215>209  EKG no ST-T change  TTE - EF 60 - 65%. No cardiac source of emboli identified.   Cardiology on board  On DAPT PTA  Will restart DAPT 24h post tPA if no bleeding  CT head repeat 24h post tPA 11/20 2:25 - Patchy evolving small volume acute ischemic infarcts involving the right subinsular white matter, adjacent insular cortex, and overlying operculum, corresponding with findings on prior MRI. No interval hemorrhage  Will start DAPT as above  Hypertension  Home BP meds: Cozaar ; amlodipine  ; HCTZ ; Coreg ; hydralazine ; Cardura  Current BP meds: Coreg and Norvasc resumed  On cleviprex - taper off today. Use prn labetalol.   Ok to transfer to floor once cleviprex discontinued and blood pressure remains stable.   Stable ( SBP 105 - 160's 01/02/20) . BP < 180/105 post tPA . Long-term BP goal normotensive  Hyperlipidemia  Home Lipid lowering medication: Lipitor 80 mg daily  LDL 11, goal < 70  Current lipid lowering medication: decrease to lipitor 40   Continue statin at discharge  Diabetes  Home diabetic meds: insulin  Current diabetic meds: SSI   HgbA1c 6.0, goal < 7.0  Controlled  CBG monitoring  Follow up with PCP  Other Stroke Risk Factors  Advanced age  Former cigarette smoker - quit  ETOH use, advised to drink no more than 1 alcoholic beverage per day.  Hx stroke/TIA  Coronary artery disease  Dysphagia   Speech on board  Passed MBS  On dys3 and thin  Gentle hydration  Other Active Problems  Code status - DNR   Dementia on Aricept and nemanda - continue   Hx of multiple syncopal episodes CKD - stage 3a - Creatinine - 1.30->1.47->1.69 Anemia of chronic disease - Hgb - 9.5->8.7->9.2->8.7 Liquid stool reported - C-diff quick screen ordered 01/01/20 - pending Hypocalcemia - Calcium - 8.0->8.0->7.7  Hospital day # 3  Recommend dual antiplatelet therapy of aspirin and Brilinta for 3 months if patient can afford it otherwise aspirin and Plavix.  Mobilize out of bed.  Ongoing therapy consults.  Increase home blood pressure medications with   blood pressure goal below 185/110.  Taper Cleviprex drip and may use as needed IV labetalol or hydralazine.D/w patient and daughter over the phone and answered questions. This patient is critically ill and at significant risk of neurological worsening, death and care requires constant monitoring of vital signs, hemodynamics,respiratory and cardiac monitoring, extensive review of multiple databases,  frequent neurological assessment, discussion with family, other specialists and medical decision making of high complexity.I have made any additions or clarifications directly to the above note.This critical care time does not reflect procedure time, or teaching time or supervisory time of PA/NP/Med Resident etc but could involve care discussion time.  I spent 30 minutes of neurocritical care time  in the care of  this patient.     Antony Contras, MD    To contact Stroke Continuity provider, please refer to [email or web address] After hours, contact General Neurology

## 2020-01-03 NOTE — Progress Notes (Addendum)
Occupational Therapy Treatment Patient Details Name: Peter [name] MRN: [id number] DOB: July 29, 1930 Today's Date: 01/03/2020    History of present illness Pt is an 86 yo male presenting with acute onset of fluctuating slurred speech, L sided weakness, and difficulty swallowing s/p tPA. MRI concerning for R MCA territory ischemia. CTA revealed a small amount of secretions within the proximal R mainstem bronchus, suggestive of aspiration risk. PMH includes: prostate ca, PAD, MI, mild dementia, HTN, HLD, gout, GERD, DM, CAD, CKD    OT comments  Pt making gradual progress towards OT goals. Pt with increased weakness today compared to initial OT/PT eval - though as session progressed mobility improves. Pt tolerating room and hallway level mobility using RW - initially requiring up to [facility] for functional transfers progressed to [place] during session. Pt requiring up to [place] for toileting ADL. BP monitored throughout (see below). He reports he has an aide 7 days/wk to assist with iADL tasks, reports daughter is arranging for 24hr supervision/assist initially at time of discharge. Continue to recommend followup HHOT services at this time however will continue to monitor for pt progress - if he is unable to have 24hr supervision/assist at time of d/c pt may require ST SNF initially. Acute OT to follow.   BP start of session 153/47 With initial mobility and seated rest break: 184/61 With additional mobility/standing activity and seated rest 175/65 End of session supine in bed: 177/63   Follow Up Recommendations  Home health OT;Supervision/Assistance - 24 hour (pending pt has necessary support/assist, if not [facility]rehab)    Equipment Recommendations  3 in 1 bedside commode          Precautions / Restrictions Precautions Precautions: Fall Restrictions Weight Bearing Restrictions: No       Mobility Bed Mobility Overal bed mobility: Needs Assistance Bed Mobility: Sit to Supine       Sit to  supine: Min guard   General bed mobility comments: increased effort to bring LEs into bed, no assist required   Transfers Overall transfer level: Needs assistance Equipment used: Rolling walker (2 wheeled) Transfers: Sit to/from Stand Sit to Stand: Mod assist;Min assist;+2 safety/equipment         General transfer comment: modA initially progressed to minA as session continued - increased time/cues for transition of UEs to RW. performed multiple sit<>stands during session     Balance Overall balance assessment: Needs assistance Sitting-balance support: No upper extremity supported;Feet supported Sitting balance-Leahy Scale: Fair     Standing balance support: Single extremity supported;During functional activity Standing balance-Leahy Scale: Poor Standing balance comment: BUE on RW                           ADL either performed or assessed with clinical judgement   ADL Overall ADL's : Needs assistance/impaired     Grooming: Set up;Sitting                   Toilet Transfer: Minimal assistance;+2 for safety/equipment;Ambulation;RW;BSC Toilet Transfer Details (indicate cue type and reason): BSC over toilet  Toileting- Clothing Manipulation and Hygiene: Moderate assistance;Sit to/from stand Toileting - Clothing Manipulation Details (indicate cue type and reason): assist for thoroughness with posterior pericare      Functional mobility during ADLs: Minimal assistance;+2 for safety/equipment;Rolling walker       Vision       Perception     Praxis      Cognition Arousal/Alertness: Awake/alert Behavior During Therapy: [facility] for tasks  assessed/performed Overall Cognitive Status: History of cognitive impairments - at baseline                                 General Comments: hx of mild dementia; overall appears appropriate today for basic tasks         Exercises     Shoulder Instructions       General Comments      Pertinent  Vitals/ Pain       Pain Assessment: No/denies pain  Home Living                                          Prior Functioning/Environment              Frequency  Min 2X/week        Progress Toward Goals  OT Goals(current goals can now be found in the care plan section)  Progress towards OT goals: Progressing toward goals  Acute Rehab OT Goals Patient Stated Goal: get better, go home OT Goal Formulation: With patient Time For Goal Achievement: 01/15/20 Potential to Achieve Goals: Good ADL Goals Pt Will Perform Lower Body Dressing: with min guard assist;sitting/lateral leans;sit to/from stand Pt/caregiver will Perform Home Exercise Program: Increased strength;With written HEP provided;Left upper extremity Additional ADL Goal #1: Pt will perform x6 mins of OOB ADL tasks with fair balance and no verbal cues for proper technique.  Plan Discharge plan remains appropriate    Co-evaluation    PT/OT/SLP Co-Evaluation/Treatment: Yes Reason for Co-Treatment: For patient/therapist safety;To address functional/ADL transfers;Other (comment) (per RN report pt had experienced decline in mobility )   OT goals addressed during session: ADL's and self-care      AM-PAC OT "6 Clicks" Daily Activity     Outcome Measure   Help from another person eating meals?: None Help from another person taking care of personal grooming?: A Little Help from another person toileting, which includes using toliet, bedpan, or urinal?: A Little Help from another person bathing (including washing, rinsing, drying)?: A Little Help from another person to put on and taking off regular upper body clothing?: A Little Help from another person to put on and taking off regular lower body clothing?: A Little 6 Click Score: 19    End of Session Equipment Utilized During Treatment: Gait belt;Rolling walker  OT Visit Diagnosis: Unsteadiness on feet (R26.81);Muscle weakness (generalized) (M62.81)    Activity Tolerance Patient tolerated treatment well   Patient Left in bed;with call bell/phone within reach;with bed alarm set   Nurse Communication Mobility status;Other (comment) (BP)        Time: 1501-1540 OT Time Calculation (min): 39 min  Charges: OT General Charges $OT Visit: 1 Visit OT Treatments $Self Care/Home Management : 8-22 mins $Therapeutic Activity: 8-22 mins  Lou Cal, OT Acute Rehabilitation Services Pager [phone] Office [place] 01/03/2020, 5:32 PM

## 2020-01-04 DIAGNOSIS — I160 Hypertensive urgency: Secondary | ICD-10-CM | POA: Diagnosis not present

## 2020-01-04 DIAGNOSIS — I639 Cerebral infarction, unspecified: Secondary | ICD-10-CM | POA: Diagnosis not present

## 2020-01-04 LAB — BASIC METABOLIC PANEL
Anion gap: 10 (ref 5–15)
BUN: 40 mg/dL — ABNORMAL HIGH (ref 8–23)
CO2: 20 mmol/L — ABNORMAL LOW (ref 22–32)
Calcium: 7.7 mg/dL — ABNORMAL LOW (ref 8.9–10.3)
Chloride: 109 mmol/L (ref 98–111)
Creatinine, Ser: 1.86 mg/dL — ABNORMAL HIGH (ref 0.61–1.24)
GFR, Estimated: 35 mL/min — ABNORMAL LOW (ref >60)
Glucose, Bld: 166 mg/dL — ABNORMAL HIGH (ref 70–99)
Potassium: 3.9 mmol/L (ref 3.5–5.1)
Sodium: 139 mmol/L (ref 135–145)

## 2020-01-04 LAB — CBC WITH DIFFERENTIAL/PLATELET
Abs Immature Granulocytes: 0.04 10*3/uL (ref 0.00–0.07)
Basophils Absolute: 0 10*3/uL (ref 0.0–0.1)
Basophils Relative: 0 %
Eosinophils Absolute: 0.2 10*3/uL (ref 0.0–0.5)
Eosinophils Relative: 2 %
HCT: 25.5 % — ABNORMAL LOW (ref 39.0–52.0)
Hemoglobin: 8.2 g/dL — ABNORMAL LOW (ref 13.0–17.0)
Immature Granulocytes: 0 %
Lymphocytes Relative: 16 %
Lymphs Abs: 1.4 10*3/uL (ref 0.7–4.0)
MCH: 26.6 pg (ref 26.0–34.0)
MCHC: 32.2 g/dL (ref 30.0–36.0)
MCV: 82.8 fL (ref 80.0–100.0)
Monocytes Absolute: 0.8 10*3/uL (ref 0.1–1.0)
Monocytes Relative: 8 %
Neutro Abs: 6.9 10*3/uL (ref 1.7–7.7)
Neutrophils Relative %: 74 %
Platelets: 255 10*3/uL (ref 150–400)
RBC: 3.08 MIL/uL — ABNORMAL LOW (ref 4.22–5.81)
RDW: 17.9 % — ABNORMAL HIGH (ref 11.5–15.5)
WBC: 9.3 10*3/uL (ref 4.0–10.5)
nRBC: 0 % (ref 0.0–0.2)

## 2020-01-04 LAB — GLUCOSE, CAPILLARY
Glucose-Capillary: 220 mg/dL — ABNORMAL HIGH (ref 70–99)
Glucose-Capillary: 120 mg/dL — ABNORMAL HIGH (ref 70–99)
Glucose-Capillary: 202 mg/dL — ABNORMAL HIGH (ref 70–99)
Glucose-Capillary: 250 mg/dL — ABNORMAL HIGH (ref 70–99)
Glucose-Capillary: 248 mg/dL — ABNORMAL HIGH (ref 70–99)

## 2020-01-04 MED ORDER — LABETALOL HCL 5 MG/ML IV SOLN
20.0000 mg | INTRAVENOUS | Status: DC | PRN
  Administered 2020-01-04 – 2020-01-05 (×3): 20 mg via INTRAVENOUS
  Filled 2020-01-04 (×2): qty 4

## 2020-01-04 MED ORDER — DOXAZOSIN MESYLATE 4 MG PO TABS
4.0000 mg | ORAL_TABLET | Freq: Every day | ORAL | Status: DC
  Administered 2020-01-04: 4 mg via ORAL
  Filled 2020-01-04 (×2): qty 1

## 2020-01-04 MED ORDER — ADULT MULTIVITAMIN W/MINERALS CH
1.0000 | ORAL_TABLET | Freq: Every day | ORAL | Status: DC
  Administered 2020-01-04 – 2020-01-10 (×7): 1 via ORAL
  Filled 2020-01-04 (×7): qty 1

## 2020-01-04 MED ORDER — ISOSORBIDE MONONITRATE ER 30 MG PO TB24
60.0000 mg | ORAL_TABLET | Freq: Every day | ORAL | Status: DC
  Administered 2020-01-04: 60 mg via ORAL
  Filled 2020-01-04 (×2): qty 2

## 2020-01-04 MED ORDER — HYDRALAZINE HCL 50 MG PO TABS
75.0000 mg | ORAL_TABLET | Freq: Three times a day (TID) | ORAL | Status: DC
  Administered 2020-01-04 (×3): 75 mg via ORAL
  Filled 2020-01-04 (×3): qty 1

## 2020-01-04 MED ORDER — AMLODIPINE BESYLATE 10 MG PO TABS
10.0000 mg | ORAL_TABLET | Freq: Every day | ORAL | Status: DC
  Administered 2020-01-04: 10 mg via ORAL
  Filled 2020-01-04: qty 1

## 2020-01-04 MED ORDER — CARVEDILOL 3.125 MG PO TABS
3.1250 mg | ORAL_TABLET | Freq: Once | ORAL | Status: AC
  Administered 2020-01-04: 3.125 mg via ORAL
  Filled 2020-01-04: qty 1

## 2020-01-04 MED ORDER — CARVEDILOL 6.25 MG PO TABS
6.2500 mg | ORAL_TABLET | Freq: Two times a day (BID) | ORAL | Status: DC
  Administered 2020-01-04 – 2020-01-10 (×12): 6.25 mg via ORAL
  Filled 2020-01-04: qty 1
  Filled 2020-01-04: qty 2
  Filled 2020-01-04 (×2): qty 1
  Filled 2020-01-04: qty 2
  Filled 2020-01-04 (×3): qty 1
  Filled 2020-01-04: qty 2
  Filled 2020-01-04: qty 1
  Filled 2020-01-04 (×2): qty 2

## 2020-01-04 NOTE — Progress Notes (Signed)
Physical Therapy Treatment Patient Details Name: Peter [name] MRN: [id number] DOB: 10/13/1930 Today's Date: 01/04/2020    History of Present Illness Pt is an 86 yo male presenting with acute onset of fluctuating slurred speech, L sided weakness, and difficulty swallowing s/p tPA. MRI concerning for R MCA territory ischemia. CTA revealed a small amount of secretions within the proximal R mainstem bronchus, suggestive of aspiration risk. PMH includes: prostate ca, PAD, MI, mild dementia, HTN, HLD, gout, GERD, DM, CAD, CKD     PT Comments    Pt improved from yesterday. Pt requiring modA to power up but steady. Pt with increased amb tolerance to 150' with RW and min guard assist. Pt assisted to bathroom to have BM, left with RN in room to supervise patient. Acute PT to cont to follow.    Follow Up Recommendations  Home health PT;Supervision for mobility/OOB     Equipment Recommendations  None recommended by PT    Recommendations for Other Services       Precautions / Restrictions Precautions Precautions: Fall Restrictions Weight Bearing Restrictions: No    Mobility  Bed Mobility Overal bed mobility: Needs Assistance Bed Mobility: Supine to Sit     Supine to sit: Min guard     General bed mobility comments: HOB elevated, increased time   Transfers Overall transfer level: Needs assistance Equipment used: Rolling walker (2 wheeled) Transfers: Sit to/from Stand Sit to Stand: Mod assist         General transfer comment: modA to power up and steady during transition of hands from bed to RW  Ambulation/Gait Ambulation/Gait assistance: Min guard Gait Distance (Feet): 150 Feet Assistive device: Rolling walker (2 wheeled) Gait Pattern/deviations: Step-through pattern;Decreased stride length;Trunk flexed Gait velocity: trunk flexed, short shuffled steps, began with increased UE dependency but improved with amb progression Gait velocity interpretation: <1.31 ft/sec,  indicative of household ambulator General Gait Details: slightly shortened stride with no lateral or posterior instability noted. cues for positioning in RW at times but pt able to maintain well   Stairs             Wheelchair Mobility    Modified Rankin (Stroke Patients Only) Modified Rankin (Stroke Patients Only) Pre-Morbid Rankin Score: No significant disability Modified Rankin: Moderately severe disability     Balance Overall balance assessment: Needs assistance Sitting-balance support: No upper extremity supported Sitting balance-Leahy Scale: Fair Sitting balance - Comments: no physical assist   Standing balance support: Bilateral upper extremity supported Standing balance-Leahy Scale: Poor Standing balance comment: dependent on RW for safe amb                            Cognition Arousal/Alertness: Awake/alert Behavior During Therapy: WFL for tasks assessed/performed Overall Cognitive Status: History of cognitive impairments - at baseline                                 General Comments: hx of mild dementia, pt with delayed processing however was able to follow all commands and complete all tasks asked      Exercises      General Comments General comments (skin integrity, edema, etc.): pt with known necrotic R toe that per RN will fall off, covered by dressing and sock, not observed      Pertinent Vitals/Pain Pain Assessment: 0-10 Pain Score: 8  Pain Location: R toe Pain Descriptors / Indicators: Discomfort  Pain Intervention(s): Monitored during session    Home Living                      Prior Function            PT Goals (current goals can now be found in the care plan section) Progress towards PT goals: Progressing toward goals    Frequency    Min 4X/week      PT Plan Current plan remains appropriate    Co-evaluation              AM-PAC PT "6 Clicks" Mobility   Outcome Measure  Help needed  turning from your back to your side while in a flat bed without using bedrails?: None Help needed moving from lying on your back to sitting on the side of a flat bed without using bedrails?: A Little Help needed moving to and from a bed to a chair (including a wheelchair)?: A Little Help needed standing up from a chair using your arms (e.g., wheelchair or bedside chair)?: A Little Help needed to walk in hospital room?: A Little Help needed climbing 3-5 steps with a railing? : A Little 6 Click Score: 19    End of Session Equipment Utilized During Treatment: Gait belt Activity Tolerance: Patient tolerated treatment well Patient left:  (on commode with RN) Nurse Communication: Mobility status PT Visit Diagnosis: Unsteadiness on feet (R26.81)     Time: 2355-7322 PT Time Calculation (min) (ACUTE ONLY): 19 min  Charges:  $Gait Training: 8-22 mins                     Peter [name], PT, DPT Acute Rehabilitation Services Pager #: [phone] Office #: [phone]    [place] Lopes 01/04/2020, 2:23 PM

## 2020-01-04 NOTE — Progress Notes (Signed)
STROKE TEAM PROGRESS NOTE   INTERVAL HISTORY      Patient is sitting up in bed.  He has no new complaints.  Neurological exam is unchanged and stable.  Blood pressure adequately controlled.  But is still on Cardene drip.  Discussed with pharmacy plan to increase hydralazine to 75 mg 3 times daily and Coreg 12.5 mg daily.  Serum creatinine has improved to 1.86 after lisinopril and had to chlorothiazide was discontinued 2 days ago. OBJECTIVE Vitals:   01/04/20 0600 01/04/20 0700 01/04/20 0741 01/04/20 0750  BP: (!) 174/60 (!) 173/52 (!) 210/62 (!) 194/71  Pulse: 73 66 61 70  Resp: 18 (!) 21 20 (!) 21  Temp:   98 F (36.7 C)   TempSrc:   Oral   SpO2: 95% 100% 100% 99%   CBC:  Recent Labs  Lab 12/30/19 2315 12/30/19 2324 01/03/20 0147 01/04/20 0249  WBC 7.0   < > 10.1 9.3  NEUTROABS 4.9  --   --  6.9  HGB 8.7*   < > 8.5* 8.2*  HCT 27.9*   < > 26.5* 25.5*  MCV 85.3   < > 82.3 82.8  PLT 235   < > 273 255   < > = values in this interval not displayed.   Basic Metabolic Panel:  Recent Labs  Lab 01/02/20 0408 01/03/20 0147  NA 138 138  K 3.7 4.1  CL 107 108  CO2 21* 23  GLUCOSE 155* 203*  BUN 24* 32*  CREATININE 1.69* 2.30*  CALCIUM 7.7* 7.7*   Lipid Panel:     Component Value Date/Time   CHOL 85 12/31/2019 0318   TRIG 152 (H) 12/31/2019 0318   HDL 44 12/31/2019 0318   CHOLHDL 1.9 12/31/2019 0318   VLDL 30 12/31/2019 0318   LDLCALC 11 12/31/2019 0318   HgbA1c:  Lab Results  Component Value Date   HGBA1C 6.0 (H) 12/31/2019   Urine Drug Screen:     Component Value Date/Time   LABOPIA NONE DETECTED 12/31/2019 1806   COCAINSCRNUR NONE DETECTED 12/31/2019 1806   LABBENZ NONE DETECTED 12/31/2019 1806   AMPHETMU NONE DETECTED 12/31/2019 1806   THCU NONE DETECTED 12/31/2019 1806   LABBARB NONE DETECTED 12/31/2019 1806    Alcohol Level     Component Value Date/Time   ETH <10 12/30/2019 2315    IMAGING  CT Code Stroke CTA Head W/WO contrast CT Code Stroke  CTA Neck W/WO contrast 12/31/2019 IMPRESSION:  1. Negative CTA for emergent large vessel occlusion.  2. Approximate 8-9 mm severe near occlusive stenosis of the proximal right M1 segment. Right MCA branches attenuated but patent distally with no visible downstream occlusion.  3. Additional severe proximal right A1 stenosis.  4. Atheromatous change about the carotid bifurcations with associated stenoses of up to 50% on the right and 45% on the left.  5. Atheromatous change about the right carotid siphon with associated moderate to severe multifocal stenoses.  6. Small amount of secretions within the proximal right mainstem bronchus, suggesting that this patient is at risk for aspiration.   MR BRAIN WO CONTRAST 12/31/2019 IMPRESSION:  1. Patchy and hazy diffusion abnormality involving the right insular cortex, subinsular white matter, and overlying right frontal operculum, consistent with acute right MCA territory ischemia. No associated hemorrhage or mass effect. Finding is consistent with the previously identified severe right M1 stenosis.  2. No other acute intracranial abnormality.  3. Age-related cerebral atrophy with mild chronic small vessel ischemic disease.  CT HEAD CODE STROKE WO CONTRAST 12/30/2019 IMPRESSION:  1. No acute intracranial infarct or other abnormality.  2. ASPECTS is 10.  3. Age-appropriate cerebral atrophy with mild chronic small vessel ischemic disease.   CT HEAD WO CONTRAST - post tPA 01/01/20 IMPRESSION: 1. Patchy evolving small volume acute ischemic infarcts involving the right subinsular white matter, adjacent insular cortex, and overlying operculum, corresponding with findings on prior MRI. No interval hemorrhage, mass effect, or other complication. 2. No other new acute intracranial abnormality.  DG Chest 2 View 12/30/2019 IMPRESSION:  1. Stable cardiomegaly. No pulmonary venous congestion.  2. Low lung volumes.   Transthoracic Echocardiogram  1.  Left ventricular ejection fraction, by estimation, is 60 to 65%. The left ventricle has normal function. The left ventricle has no regional wall motion abnormalities. There is mild concentric left ventricular hypertrophy. Left ventricular diastolic parameters are consistent with Grade II diastolic dysfunction (pseudonormalization). Elevated left atrial pressure. The E/e' is 3.  2. Right ventricular systolic function is normal. The right ventricular size is normal. Tricuspid regurgitation signal is inadequate for assessing PA pressure.  3. Left atrial size was mildly dilated.  4. The mitral valve is degenerative. No evidence of mitral valve regurgitation. No evidence of mitral stenosis.  5. The aortic valve is tricuspid. Aortic valve regurgitation is not visualized. No aortic stenosis is present.  6. The inferior vena cava is normal in size with greater than 50% respiratory variability, suggesting right atrial pressure of 3 mmHg.  ECG - SR rate 70 BPM. (See cardiology reading for complete details)   PHYSICAL EXAM   General - Well nourished, well developed, in no apparent distress.  Ophthalmologic - fundi not visualized due to noncooperation.  Cardiovascular - Regular rhythm and rate.  Mental Status -  Level of arousal and orientation to time, place, and person were intact.  There is mild to moderate dysarthria noted. Language including expression, naming, repetition, comprehension was assessed and found intact.  Cranial Nerves II - XII - II - Visual field intact OU. III, IV, VI - Extraocular movements intact. V - Facial sensation intact bilaterally. VII - mild left facial droop. VIII - Hearing & vestibular intact bilaterally. X - Palate elevates symmetrically. XI - Chin turning & shoulder shrug intact bilaterally. XII - Tongue protrusion intact.  Motor Strength - The patient's strength was normal in all extremities except mild left UE pronator drift.  There is clawhand deformity  of the hands bilaterally more profound on the right side Likely from prior ulnar nerve damage at the elbow.  There is marked global atrophy of the intrinsic hand muscles on the right and mild on the left.  These are associated with weakness of the hand muscles bilaterally. Motor Tone - Muscle tone was assessed at the neck and was normal.  Reflexes - The patient's reflexes were symmetrical in all extremities and he had no pathological reflexes.  Sensory - Light touch, temperature/pinprick were assessed and were symmetrical.    Coordination - The patient had normal movements in the hands with no ataxia or dysmetria on right side. Mild left sided ataxia.   Gait and Station - deferred.   ASSESSMENT/PLAN [name] is a 86 y.o. male with history of coronary artery disease, CKD stage III, dementia, diabetes, diastolic dysfunction, peripheral vascular disease, prostate cancer, history of multiple syncopal episodes, presented to the emergency room for evaluation of sudden onset of slurred speech, difficulty swallowing and left-sided weakness. He was scheduled for a GI scoping today,  but could not go ahead with the procedure because of severely elevated blood pressures with systolic in the 702O.  His Plavix was on hold for procedure.  The patient received IV t-PA Friday 12/31/19 at [facility].  Stroke: acute right MCA pachy infarcts with right M1 occlusion s/p tPA - cardioembolic vs. Large vessel disease source  CT Head - No acute intracranial infarct or other abnormality. ASPECTS is 10.   CTA H&N - Approximate 8-9 mm severe near occlusive stenosis of the proximal right M1 segment. Right MCA branches attenuated but patent distally with no visible downstream occlusion. Additional severe proximal right A1 stenosis. ICA stenosis up to 50% on the right and 45% on the left. Atheromatous change about the right carotid siphon with associated moderate to severe multifocal stenoses.  MRI head - Patchy and  hazy diffusion abnormality involving the right insular cortex, subinsular white matter, and overlying right frontal operculum, consistent with acute right MCA territory ischemia. No associated hemorrhage or mass effect. Finding is consistent with the previously identified severe right M1 stenosis.   CT head repeat 24h post tPA - Patchy evolving small volume acute ischemic infarcts involving the right subinsular white matter, adjacent insular cortex, and overlying operculum, corresponding with findings on prior MRI. No interval hemorrhage  2D Echo - EF 60 - 65%. No cardiac source of emboli identified.   Consider 30 day cardiac event monitoring as outpt to rule out afib  [employer] Virus 2 - negative  LDL - 11  HgbA1c - 6.0  UDS neg  VTE prophylaxis - SCDs  aspirin 81 mg daily and clopidogrel 75 mg daily (Plavix held 5 days for colonoscopy) prior to admission, Aspirin 81 mg daily and Plavix 75 mg daily restarted 01/01/20, changed plavix to brillinta. Continue aspirin 81mg  and brillinta for 3 months, then discontinue aspirin and continue only with brillinta.    Therapy recommendations:  HH PT, HH OT   Disposition:  Pending  Plan transfer to floor once cleviprex discontinued and blood pressure remains stable.   CAD s/p stent Elevated troponin  Troponin 35->50->215>209  EKG no ST-T change  TTE - EF 60 - 65%. No cardiac source of emboli identified.   Cardiology on board  On DAPT PTA  CT head repeat 24h post tPA 11/20 2:25 - Patchy evolving small volume acute ischemic infarcts involving the right subinsular white matter, adjacent insular cortex, and overlying operculum, corresponding with findings on prior MRI. No interval hemorrhage  On DAPT as above  Hypertensive Urgency  Home BP meds: Cozaar ; amlodipine ; HCTZ ; Coreg ; hydralazine ; Cardura  BP up this am   On cleviprex now, recently resumed. RN to wean. Increased prn labetalol dose  Increased po med  dosing . Long-term BP goal normotensive  Hyperlipidemia  Home Lipid lowering medication: Lipitor 80 mg daily  LDL 11, goal < 70  Current lipid lowering medication: decrease to lipitor 40   Continue statin at discharge  Diabetes  Home diabetic meds: insulin  Current diabetic meds: SSI   HgbA1c 6.0, goal < 7.0  Controlled  CBG monitoring  Follow up with PCP  Other Stroke Risk Factors  Advanced age  Former cigarette smoker - quit  ETOH use, advised to drink no more than 1 alcoholic beverage per day.  Hx stroke/TIA  Coronary artery disease  Dysphagia   Speech on board  Passed MBS  On dys 3 and thin  Gentle hydration  Other Active Problems  Code status - DNR  Dementia on Aricept and nemanda - continue   Hx of multiple syncopal episode  CKD - stage 3a - Creatinine - 1.30->1.47->1.69->2.30->1.86  necrotic R foot 2nd toe w/o planned amputation per podiatry, VVS 12/2019. dsg changes daily  Anemia of chronic disease - Hgb - 8.2  Liquid stool reported - C-diff quick screen ordered 01/01/20 - negatvie - stopped enteric isolation  Hypocalcemia - Calcium - 7.7  Hospital day # 4 Mobilize out of bed.  Continue therapy consults.  Repeat creatinine.  Increase Coreg and hydralazine.  Patient has necrotic right second toe but this is an old finding and he has followed up with the podiatrist but not considered to be a surgical candidate since he has severe peripheral vascular disease and amputation wound will not likely heal.  Transfer to neurology floor bed later when off Cleviprex drip.  Greater than 50% time during this 35-minute visit was spent on counseling and coordination of care about his stroke blood pressure control and discussion with care team.  Discussed with pharmacist Antony Contras, MD To contact Stroke Continuity provider, please refer to [email or web address] After hours, contact General Neurology

## 2020-01-04 NOTE — Progress Notes (Addendum)
Progress Note  Patient Name: Peter [name] Date of Encounter: 01/04/2020  [facility] Cardiologist: [name], MD   Subjective   Pt remains on cleviprex. No complaints.  Inpatient Medications    Scheduled Meds: . allopurinol  100 mg Oral Daily  . amLODipine  10 mg Oral Daily  . aspirin EC  81 mg Oral Daily  . atorvastatin  40 mg Oral Daily  . carvedilol  6.25 mg Oral BID WC  . Chlorhexidine Gluconate Cloth  6 each Topical Daily  . docusate sodium  100 mg Oral Daily  . donepezil  10 mg Oral Daily  . doxazosin  4 mg Oral Daily  . DULoxetine  30 mg Oral Daily  . feeding supplement (NEPRO CARB STEADY)  237 mL Oral Q24H  . ferrous sulfate  325 mg Oral Daily  . finasteride  5 mg Oral Daily  . gabapentin  300 mg Oral BID  . hydrALAZINE  75 mg Oral TID  . insulin aspart  0-15 Units Subcutaneous TID WC  . insulin aspart  0-5 Units Subcutaneous QHS  . isosorbide mononitrate  60 mg Oral Daily  . labetalol  20 mg Intravenous Once  . memantine  10 mg Oral BID  . multivitamin  1 tablet Oral Daily  . ticagrelor  90 mg Oral BID   Continuous Infusions: . sodium chloride    . sodium chloride Stopped (01/02/20 1234)  . clevidipine 2 mg/hr (01/04/20 0749)   PRN Meds: acetaminophen **OR** acetaminophen (TYLENOL) oral liquid 160 mg/5 mL **OR** acetaminophen, labetalol, Resource ThickenUp Clear, senna-docusate   Vital Signs    Vitals:   01/04/20 0600 01/04/20 0700 01/04/20 0741 01/04/20 0750  BP: (!) 174/60 (!) 173/52 (!) 210/62 (!) 194/71  Pulse: 73 66 61 70  Resp: 18 (!) 21 20 (!) 21  Temp:   98 F (36.7 C)   TempSrc:   Oral   SpO2: 95% 100% 100% 99%    Intake/Output Summary (Last 24 hours) at 01/04/2020 0934 Last data filed at 01/04/2020 0700 Gross per 24 hour  Intake 913.22 ml  Output 200 ml  Net 713.22 ml   Last 3 Weights 12/30/2019 12/30/2019 12/13/2019  Weight (lbs) 160 lb 160 lb 155 lb 9.6 oz  Weight (kg) 72.576 kg 72.576 kg 70.58 kg       Telemetry    Sinus rhythm, PACs 70s - Personally Reviewed  ECG    No new tracings - Personally Reviewed  Physical Exam   GEN: No acute distress.   Neck: No JVD Cardiac: RRR, no murmurs, rubs, or gallops.  Respiratory: Clear to auscultation bilaterally. GI: Soft, nontender, non-distended  MS: No edema; No deformity. Psych: Normal affect   Labs    High Sensitivity Troponin:   Recent Labs  Lab 12/30/19 1210 12/30/19 1427 12/30/19 2315 12/31/19 0318  TROPONINIHS 35* 50* 215* 209*      Chemistry Recent Labs  Lab 12/30/19 2315 12/30/19 2324 01/01/20 0729 01/02/20 0408 01/03/20 0147  NA 138   < > 139 138 138  K 3.6   < > 3.6 3.7 4.1  CL 106   < > 109 107 108  CO2 23   < > 21* 21* 23  GLUCOSE 221*   < > 123* 155* 203*  BUN 18   < > 18 24* 32*  CREATININE 1.47*   < > 1.47* 1.69* 2.30*  CALCIUM 8.0*   < > 8.0* 7.7* 7.7*  PROT 5.0*  --   --   --   --  ALBUMIN 2.6*  --   --   --   --   AST 22  --   --   --   --   ALT 24  --   --   --   --   ALKPHOS 70  --   --   --   --   BILITOT 0.3  --   --   --   --   GFRNONAA 46*   < > 46* 39* 27*  ANIONGAP 9   < > 9 10 7    < > = values in this interval not displayed.     Hematology Recent Labs  Lab 01/02/20 0408 01/03/20 0147 01/04/20 0249  WBC 8.7 10.1 9.3  RBC 3.26* 3.22* 3.08*  HGB 8.7* 8.5* 8.2*  HCT 27.2* 26.5* 25.5*  MCV 83.4 82.3 82.8  MCH 26.7 26.4 26.6  MCHC 32.0 32.1 32.2  RDW 17.3* 17.7* 17.9*  PLT 269 273 255    BNPNo results for input(s): BNP, PROBNP in the last 168 hours.   DDimer No results for input(s): DDIMER in the last 168 hours.   Radiology    No results found.  Cardiac Studies   Echo 12/31/19: 1. Left ventricular ejection fraction, by estimation, is 60 to 65%. The  left ventricle has normal function. The left ventricle has no regional  wall motion abnormalities. There is mild concentric left ventricular  hypertrophy. Left ventricular diastolic  parameters are consistent with  Grade II diastolic dysfunction  (pseudonormalization). Elevated left atrial pressure. The E/e' is 32.  2. Right ventricular systolic function is normal. The right ventricular  size is normal. Tricuspid regurgitation signal is inadequate for assessing  PA pressure.  3. Left atrial size was mildly dilated.  4. The mitral valve is degenerative. No evidence of mitral valve  regurgitation. No evidence of mitral stenosis.  5. The aortic valve is tricuspid. Aortic valve regurgitation is not  visualized. No aortic stenosis is present.  6. The inferior vena cava is normal in size with greater than 50%  respiratory variability, suggesting right atrial pressure of 3 mmHg.    Coronary stent intervention 07/05/19: Conclusions: 1. Severely diseased proximal and mid LAD with multifocal stenoses of up to 80-90% and heavy calcification. 2. Successful IVUS-guided orbital atherectomy and PCI of the ostial through mid LAD using a Synergy 3.0 x 48 mm drug-eluting stent (postdilated up to 3.9 mm) with less than 10% residual stenosis and TIMI-3 flow.  Recommendations: 1. Continue dual antiplatelet therapy with aspirin and clopidogrel for at least 12 months. 2. Titrate nitroglycerin for relief of chest pain that occurred during atherectomy and balloon inflation as well as for improved blood pressure control. 3. Remove left femoral artery sheath with manual compression once ACT has fallen below 175 seconds. 4. Aggressive secondary prevention.   Patient Profile     86 y.o. male with a history of peripheral arterial disease with multiple prior amputations, CAD s/p RCA and LAD PCI, T2Dm, HTN, CKD3, and prostatic adenocarcinoma admitted s/p TPA in setting of code stroke.  Assessment & Plan    Elevated troponin Hypertensive urgency - elevated troponin in the setting of HTN urgency and stroke - intervention in May 2021 - no chest pain - suspect demand ischemia - echo with preserved EF and no WMA - will  not proceed with invasive testing at this time - DAPT has been resumed   Acute CVA - per neuro - did receive TPA, head CT stable - DAPT resumed -  have ordered a 30 day event monitor to rule out atrial arrhythmia   CAD - demand ischemia this admission - cath May 2021 with orbital atherectomy and DES of ostial through mid LAD - DAPT x 12 months   Hypertension - on imdur, 25 mg hydralazine TID, 10 mg amlodipine - losartan and HCTZ on hold for renal function - on IV cleviprex - primary has already increased hydralazine to 75 mg TID today - will hold off on further changes for now, would like to avoid clonidine   Hyperlipidemia with LDL goal < 70 - lipitor was decreased to 40 mg by neuro   CKD stage IIIa - sCr 2.30 yesterday, BMP pending today - baseline appears to be around 1.9      For questions or updates, please contact [facility] Please consult [email or web address] for contact info under        Signed, Ledora Bottcher, PA  01/04/2020, 9:34 AM    History and all data above reviewed.  Patient examined.  I agree with the findings as above.  He feels well.  Walked with PT. No pain.  No SOB. The patient exam reveals COR:RRR  ,  Lungs: Clear  ,  Abd: Positive bowel sounds, no rebound no guarding, Ext Mild edema of the hand  .  All available labs, radiology testing, previous records reviewed. Agree with documented assessment and plan.   Hydralazine increased today.  Norvasc increased yesterday.  I am hoping that we can get him off of clevidipine today.    Jeneen Rinks Shanara Schnieders  11:19 AM  01/04/2020

## 2020-01-04 NOTE — Progress Notes (Signed)
Nutrition Follow-up  DOCUMENTATION CODES:   Not applicable  INTERVENTION:   Magic cup TID with meals, each supplement provides 290 kcal and 9 grams of protein  Double protein on each meal tray  MVI with minerals daily    NUTRITION DIAGNOSIS:   Increased nutrient needs related to wound healing (diabetic ulcer to right middle toe with exposed bone) as evidenced by estimated needs. Ongoing.  GOAL:   Patient will meet greater than or equal to 90% of their needs Met.   MONITOR:   PO intake, Supplement acceptance, Labs, I & O's, Skin, Weight trends  REASON FOR ASSESSMENT:   Malnutrition Screening Tool    ASSESSMENT:   86 year old male admitted for acute ischemic stroke likely secondary to intracranial atherosclerosis and hypoperfusion. Past medical history significant of CKD stage IIIb, CAD s/p stenting, DM, chronic dCHF, GERD, gout, HTN, HLD, dementia, PAD, prostate cancer, and multiple syncopal episodes presents with sudden onset of slurred speech, difficulty swallowing, and left-sided weakness.  Pt discussed during ICU rounds and with RN.  Pt eating well, remains on cleviprex. Pt lives at home; plans to return to home with home health at discharge.   Medications and labs reviewed  Cleviprex @ 8 ml/hr provides:  384 kcal    Diet Order:   Diet Order            Diet regular Room service appropriate? Yes; Fluid consistency: Nectar Thick  Diet effective now                 EDUCATION NEEDS:   Not appropriate for education at this time  Skin:  Skin Assessment: Skin Integrity Issues: Skin Integrity Issues:: Diabetic Ulcer, Other (Comment) Diabetic Ulcer: right middle toe; bone exposed Other: MASD; medial sacrum; amputation;right third and fourth toes  Last BM:  11/23  Height:   Ht Readings from Last 1 Encounters:  12/30/19 '5\' 7"'  (1.702 m)    Weight:   Wt Readings from Last 1 Encounters:  12/30/19 72.6 kg    Ideal Body Weight:     BMI:  There is no  height or weight on file to calculate BMI.  Estimated Nutritional Needs:   Kcal:  6256-3893  Protein:  100-115  Fluid:  >1.8 L/day  Lockie Pares., RD, LDN, CNSC See AMiON for contact information

## 2020-01-05 ENCOUNTER — Other Ambulatory Visit: Payer: Self-pay | Admitting: Internal Medicine

## 2020-01-05 ENCOUNTER — Other Ambulatory Visit (HOSPITAL_COMMUNITY): Payer: Self-pay | Admitting: Nurse Practitioner

## 2020-01-05 DIAGNOSIS — F329 Major depressive disorder, single episode, unspecified: Secondary | ICD-10-CM | POA: Diagnosis not present

## 2020-01-05 DIAGNOSIS — I739 Peripheral vascular disease, unspecified: Secondary | ICD-10-CM | POA: Diagnosis not present

## 2020-01-05 DIAGNOSIS — Z794 Long term (current) use of insulin: Secondary | ICD-10-CM | POA: Diagnosis not present

## 2020-01-05 DIAGNOSIS — D509 Iron deficiency anemia, unspecified: Secondary | ICD-10-CM | POA: Diagnosis not present

## 2020-01-05 DIAGNOSIS — I129 Hypertensive chronic kidney disease with stage 1 through stage 4 chronic kidney disease, or unspecified chronic kidney disease: Secondary | ICD-10-CM | POA: Diagnosis not present

## 2020-01-05 DIAGNOSIS — Z951 Presence of aortocoronary bypass graft: Secondary | ICD-10-CM

## 2020-01-05 DIAGNOSIS — F0390 Unspecified dementia without behavioral disturbance: Secondary | ICD-10-CM | POA: Diagnosis not present

## 2020-01-05 DIAGNOSIS — I2510 Atherosclerotic heart disease of native coronary artery without angina pectoris: Secondary | ICD-10-CM | POA: Diagnosis not present

## 2020-01-05 DIAGNOSIS — Z87891 Personal history of nicotine dependence: Secondary | ICD-10-CM

## 2020-01-05 DIAGNOSIS — R2689 Other abnormalities of gait and mobility: Secondary | ICD-10-CM | POA: Diagnosis not present

## 2020-01-05 DIAGNOSIS — E1142 Type 2 diabetes mellitus with diabetic polyneuropathy: Secondary | ICD-10-CM | POA: Diagnosis not present

## 2020-01-05 DIAGNOSIS — I160 Hypertensive urgency: Secondary | ICD-10-CM | POA: Diagnosis not present

## 2020-01-05 DIAGNOSIS — Z8673 Personal history of transient ischemic attack (TIA), and cerebral infarction without residual deficits: Secondary | ICD-10-CM

## 2020-01-05 DIAGNOSIS — I639 Cerebral infarction, unspecified: Secondary | ICD-10-CM | POA: Diagnosis not present

## 2020-01-05 DIAGNOSIS — G894 Chronic pain syndrome: Secondary | ICD-10-CM | POA: Diagnosis not present

## 2020-01-05 DIAGNOSIS — Z7984 Long term (current) use of oral hypoglycemic drugs: Secondary | ICD-10-CM | POA: Diagnosis not present

## 2020-01-05 DIAGNOSIS — N1832 Chronic kidney disease, stage 3b: Secondary | ICD-10-CM | POA: Diagnosis not present

## 2020-01-05 LAB — GLUCOSE, CAPILLARY
Glucose-Capillary: 151 mg/dL — ABNORMAL HIGH (ref 70–99)
Glucose-Capillary: 204 mg/dL — ABNORMAL HIGH (ref 70–99)
Glucose-Capillary: 211 mg/dL — ABNORMAL HIGH (ref 70–99)
Glucose-Capillary: 281 mg/dL — ABNORMAL HIGH (ref 70–99)

## 2020-01-05 LAB — BASIC METABOLIC PANEL
Anion gap: 8 (ref 5–15)
BUN: 37 mg/dL — ABNORMAL HIGH (ref 8–23)
CO2: 20 mmol/L — ABNORMAL LOW (ref 22–32)
Calcium: 7.8 mg/dL — ABNORMAL LOW (ref 8.9–10.3)
Chloride: 109 mmol/L (ref 98–111)
Creatinine, Ser: 1.52 mg/dL — ABNORMAL HIGH (ref 0.61–1.24)
GFR, Estimated: 44 mL/min — ABNORMAL LOW (ref >60)
Glucose, Bld: 241 mg/dL — ABNORMAL HIGH (ref 70–99)
Potassium: 4 mmol/L (ref 3.5–5.1)
Sodium: 137 mmol/L (ref 135–145)

## 2020-01-05 MED ORDER — CLONIDINE HCL 0.1 MG PO TABS
0.1000 mg | ORAL_TABLET | Freq: Three times a day (TID) | ORAL | Status: DC
  Administered 2020-01-05: 0.1 mg via ORAL
  Filled 2020-01-05: qty 1

## 2020-01-05 MED ORDER — AMLODIPINE BESYLATE 10 MG PO TABS
10.0000 mg | ORAL_TABLET | Freq: Every day | ORAL | Status: DC
  Administered 2020-01-05 – 2020-01-10 (×6): 10 mg via ORAL
  Filled 2020-01-05 (×6): qty 1

## 2020-01-05 MED ORDER — HYDRALAZINE HCL 25 MG PO TABS: 75.0000 mg | ORAL_TABLET | Freq: Three times a day (TID) | ORAL | 2 refills | Status: DC

## 2020-01-05 MED ORDER — ATORVASTATIN CALCIUM 80 MG PO TABS: 40.0000 mg | ORAL_TABLET | Freq: Every day | ORAL | Status: DC

## 2020-01-05 MED ORDER — TICAGRELOR 90 MG PO TABS: 90.0000 mg | ORAL_TABLET | Freq: Two times a day (BID) | ORAL | 2 refills | Status: DC

## 2020-01-05 MED ORDER — ISOSORBIDE MONONITRATE ER 60 MG PO TB24
60.0000 mg | ORAL_TABLET | Freq: Every day | ORAL | Status: DC
  Administered 2020-01-05 – 2020-01-10 (×6): 60 mg via ORAL
  Filled 2020-01-05: qty 2
  Filled 2020-01-05 (×3): qty 1
  Filled 2020-01-05: qty 2
  Filled 2020-01-05: qty 1

## 2020-01-05 MED ORDER — CLONIDINE HCL 0.1 MG PO TABS
0.1000 mg | ORAL_TABLET | Freq: Two times a day (BID) | ORAL | Status: DC
  Administered 2020-01-05 – 2020-01-06 (×2): 0.1 mg via ORAL
  Filled 2020-01-05 (×2): qty 1

## 2020-01-05 MED ORDER — DOXAZOSIN MESYLATE 4 MG PO TABS
4.0000 mg | ORAL_TABLET | Freq: Every day | ORAL | Status: DC
  Administered 2020-01-05 – 2020-01-10 (×6): 4 mg via ORAL
  Filled 2020-01-05 (×6): qty 1

## 2020-01-05 MED ORDER — CARVEDILOL 6.25 MG PO TABS: 6.2500 mg | ORAL_TABLET | Freq: Two times a day (BID) | ORAL | 2 refills | Status: DC

## 2020-01-05 MED ORDER — HYDRALAZINE HCL 50 MG PO TABS
100.0000 mg | ORAL_TABLET | Freq: Three times a day (TID) | ORAL | Status: DC
  Administered 2020-01-05: 100 mg via ORAL
  Filled 2020-01-05: qty 2

## 2020-01-05 MED ORDER — CLONIDINE HCL 0.1 MG PO TABS: 0.1000 mg | ORAL_TABLET | Freq: Two times a day (BID) | ORAL | 11 refills | Status: DC

## 2020-01-05 MED ORDER — HYDRALAZINE HCL 50 MG PO TABS
75.0000 mg | ORAL_TABLET | Freq: Three times a day (TID) | ORAL | Status: DC
  Administered 2020-01-05 – 2020-01-06 (×3): 75 mg via ORAL
  Filled 2020-01-05 (×3): qty 1

## 2020-01-05 MED FILL — cloNIDine HCL 0.1 MG TABS: 0.1 | 30 days supply | Qty: 60 | Fill #0

## 2020-01-05 MED FILL — BRILINTA 90 MG TABLET: 90 | 30 days supply | Qty: 60 | Fill #0

## 2020-01-05 MED FILL — hydrALAZINE HCL 25 MG TABS: 25 | 30 days supply | Qty: 180 | Fill #0

## 2020-01-05 MED FILL — CARVEDILOL 6.25 MG TABLET: 6.25 | 30 days supply | Qty: 60 | Fill #0

## 2020-01-05 NOTE — Progress Notes (Addendum)
Occupational Therapy Treatment Patient Details Name: Peter [name] MRN: [id number] DOB: 1931/01/28 Today's Date: 01/05/2020    History of present illness Pt is an 86 yo male presenting with acute onset of fluctuating slurred speech, L sided weakness, and difficulty swallowing s/p tPA. MRI concerning for R MCA territory ischemia. CTA revealed a small amount of secretions within the proximal R mainstem bronchus, suggestive of aspiration risk. PMH includes: prostate ca, PAD, MI, mild dementia, HTN, HLD, gout, GERD, DM, CAD, CKD    OT comments  Pt with gradual progress towards OT goals. Pt up in bathroom with RN upon arrival to room. Pt requiring modA for toileting ADL, minA for mobility tasks using RW throughout. Pt does require seated rest break while performing grooming ADL at sink with min cues provided to initiate break. BP end of session 170/49. Continue to recommend pt have hands on 24hr supervision/assist at time of discharge given assist needed for functional transfers as well as fatigue with prolonged standing activity. Recommend follow up [facility] services to further maximize his safety and independence with ADL and mobility. Will follow while acutely admitted.   Follow Up Recommendations  Home health OT;Supervision/Assistance - 24 hour (pending pt has necessary assist)    Equipment Recommendations  3 in 1 bedside commode          Precautions / Restrictions Precautions Precautions: Fall Restrictions Weight Bearing Restrictions: No       Mobility Bed Mobility               General bed mobility comments: OOB in bathroom with RN upon arrival   Transfers Overall transfer level: Needs assistance Equipment used: Rolling walker (2 wheeled) Transfers: Sit to/from Stand Sit to Stand: Min assist         General transfer comment: assist to power up and steady during transition of hands from bed to RW; stood from [facility] over toilet and BSC in front of sink    Balance Overall  balance assessment: Needs assistance Sitting-balance support: No upper extremity supported Sitting balance-Leahy Scale: Fair Sitting balance - Comments: no physical assist   Standing balance support: Bilateral upper extremity supported Standing balance-Leahy Scale: Poor Standing balance comment: dependent on UE support                           ADL either performed or assessed with clinical judgement   ADL Overall ADL's : Needs assistance/impaired     Grooming: Oral care;Minimal assistance;Standing;Supervision/safety;Sitting Grooming Details (indicate cue type and reason): minA during standing portion; supervision while seated, pt needing to sit for rest break during task completion, completing remainder of task while seated          Upper Body Dressing : Minimal assistance;Sitting Upper Body Dressing Details (indicate cue type and reason): donning new gown      Toilet Transfer: Minimal assistance;BSC;Ambulation Toilet Transfer Details (indicate cue type and reason): BSC over toilet Toileting- Clothing Manipulation and Hygiene: Moderate assistance;Sit to/from stand Toileting - Clothing Manipulation Details (indicate cue type and reason): pt requires assist for pericare after BM; reports typically could perform on his own at home via lateral leans given his toilet is a bit wider     Functional mobility during ADLs: Minimal assistance;Rolling walker                 Cognition Arousal/Alertness: Awake/alert Behavior During Therapy: WFL for tasks assessed/performed Overall Cognitive Status: History of cognitive impairments - at baseline  General Comments: hx of mild dementia, pt with delayed processing however was able to follow all commands and complete all tasks asked        Exercises     Shoulder Instructions       General Comments pt with episodes of vtach and PVCs during session - RN in room at this time.  Pt brady at rest end of session HR in the upper 50s, SpO2 > 96% on RA    Pertinent Vitals/ Pain       Pain Assessment: No/denies pain  Home Living                                          Prior Functioning/Environment              Frequency  Min 2X/week        Progress Toward Goals  OT Goals(current goals can now be found in the care plan section)  Progress towards OT goals: Progressing toward goals  Acute Rehab OT Goals Patient Stated Goal: get better, go home OT Goal Formulation: With patient Time For Goal Achievement: 01/15/20 Potential to Achieve Goals: Good ADL Goals Pt Will Perform Lower Body Dressing: with min guard assist;sitting/lateral leans;sit to/from stand Pt/caregiver will Perform Home Exercise Program: Increased strength;With written HEP provided;Left upper extremity Additional ADL Goal #1: Pt will perform x6 mins of OOB ADL tasks with fair balance and no verbal cues for proper technique.  Plan Discharge plan remains appropriate    Co-evaluation                 AM-PAC OT "6 Clicks" Daily Activity     Outcome Measure   Help from another person eating meals?: None Help from another person taking care of personal grooming?: A Little Help from another person toileting, which includes using toliet, bedpan, or urinal?: A Little Help from another person bathing (including washing, rinsing, drying)?: A Little Help from another person to put on and taking off regular upper body clothing?: A Little Help from another person to put on and taking off regular lower body clothing?: A Little 6 Click Score: 19    End of Session Equipment Utilized During Treatment: Gait belt;Rolling walker  OT Visit Diagnosis: Unsteadiness on feet (R26.81);Muscle weakness (generalized) (M62.81) Hemiplegia - Right/Left: Left   Activity Tolerance Patient tolerated treatment well   Patient Left in chair;with call bell/phone within reach;with chair alarm  set   Nurse Communication Mobility status        Time: [phone] OT Time Calculation (min): 31 min  Charges: OT General Charges $OT Visit: 1 Visit OT Treatments $Self Care/Home Management : 23-37 mins  Lou Cal, OT Acute Rehabilitation Services Pager [phone] Office [phone]    Raymondo Band 01/05/2020, 9:47 AM

## 2020-01-05 NOTE — Progress Notes (Signed)
  Speech Language Pathology Treatment: Dysphagia  Patient Details Name: Peter [name] MRN: [id number] DOB: [birth year] Today's Date: 01/05/2020 Time: 7121-9758 SLP Time Calculation (min) (ACUTE ONLY): 15 min  Assessment / Plan / Recommendation Clinical Impression  Pt was seen for swallowing given acute changes in speech noted earlier by RN, also associated wtih coughing while drinking nectar thick liquids. RN does note that this was only with larger boluses. His speech seems to be back to his baseline here in the hospital, and he was observed with nectar thick liquids/regular solids. SLP provided Min cues for use of smaller sips. SLP also introduced effortful swallows and masako maneuver, although pt was not able to swallow with his tongue out. He had some confusion with how to implement an effortful swallow, but appeared to better complete this exercise after training. Pt was noted to cough after SLP left the room, and pt said that he was trying to swallow with his tongue out. Reiterated NOT to perform that exercise with any POs. Pt will continue to benefit from SLP f/u acutely and with [facility] SLP follow up.    HPI HPI: Pt is an 86 yo male presenting with acute onset of fluctuating slurred speech, L sided weakness, and difficulty swallowing s/p tPA. MRI concerning for R MCA territory ischemia. CTA revealed a small amount of secretions within the proximal R mainstem bronchus, suggestive of aspiration risk. PMH includes: prostate ca, PAD, MI, mild dementia, HTN, HLD, gout, GERD, DM, CAD, CKD      SLP Plan  Continue with current plan of care       Recommendations  Diet recommendations: Regular;Nectar-thick liquid Liquids provided via: Cup;No straw Medication Administration: Whole meds with puree Supervision: Patient able to self feed Compensations: Slow rate;Small sips/bites Postural Changes and/or Swallow Maneuvers: Seated upright 90 degrees;Upright 30-60 min after meal                 Oral Care Recommendations: Oral care BID Follow up Recommendations: 24 hour supervision/assistance;Inpatient Rehab vs Ambulatory [facility] Center SLP SLP Visit Diagnosis: Dysphagia, unspecified (R13.10) Plan: Continue with current plan of care       GO                Osie Bond., M.A. [facility] Acute Rehabilitation Services Pager [phone] Office [phone]  01/05/2020, 2:30 PM

## 2020-01-05 NOTE — Discharge Summary (Addendum)
Stroke Discharge Summary  Patient ID: Peter [name]   MRN: [id number]      DOB: Sep 20, 1930  Date of Admission: 12/30/2019 Date of Discharge: 01/10/2020  Attending Physician:  Rosalin Hawking, MD, Stroke MD Consultant(s):   Letta Kocher, MD (  cardiology ), Val Riles RN ([place]) Patient's PCP:  Lesleigh Noe, MD  DISCHARGE DIAGNOSIS:  Principal Problem:   Acute ischemic stroke ([place]) R MCA w/ R M1 occlusion s/p tPA, embolic vs large vessel disease source Active Problems:   Hypertension   DM (diabetes mellitus) type II controlled with renal manifestation ([place])   Chronic kidney disease, stage 3b ([place])   Dementia ([place])   Bradycardia   Peripheral neuropathy   Hyperlipidemia   Hx of myocardial infarction   Hx of malignant neoplasm of prostate   Chronic pain syndrome   Coronary artery disease involving native coronary artery of native heart without angina pectoris   Peripheral vascular disease, unspecified (HCC)   CAD in native artery   Elevated troponin level   DNR (do not resuscitate) discussion   Anemia due to stage 3b chronic kidney disease ([place])   Necrotic toes ([place])   Atherosclerosis of native arteries of extremities with gangrene, bilateral legs (HCC)   Chronic constipation   Pressure injury of skin   Allergies as of 01/10/2020      Reactions   Penicillins Anaphylaxis   Reported airway compromise 86 years      Medication List    STOP taking these medications   clopidogrel 75 MG tablet Commonly known as: PLAVIX   hydrochlorothiazide 12.5 MG tablet Commonly known as: HYDRODIURIL Replaced by: hydrochlorothiazide 12.5 MG capsule   losartan 100 MG tablet Commonly known as: COZAAR   oxyCODONE-acetaminophen 5-325 MG tablet Commonly known as: PERCOCET/ROXICET     TAKE these medications   alendronate 70 MG tablet Commonly known as: FOSAMAX Take 70 mg by mouth every Saturday.   allopurinol 100 MG tablet Commonly known as: ZYLOPRIM TAKE 1 TABLET BY MOUTH  EVERY DAY   amLODipine 10 MG tablet Commonly known as: NORVASC TAKE 1 TABLET BY MOUTH EVERY DAY   aspirin EC 81 MG tablet Take 81 mg by mouth every evening.   atorvastatin 80 MG tablet Commonly known as: LIPITOR Take 0.5 tablets (40 mg total) by mouth daily. What changed: how much to take   carvedilol 6.25 MG tablet Commonly known as: COREG Take 1 tablet (6.25 mg total) by mouth 2 (two) times daily with a meal. What changed:   medication strength  how much to take   donepezil 10 MG tablet Commonly known as: ARICEPT Take 10 mg by mouth daily.   doxazosin 4 MG tablet Commonly known as: CARDURA TAKE 1 TABLET (4 MG TOTAL) BY MOUTH AT BEDTIME. What changed: See the new instructions.   DULoxetine 30 MG capsule Commonly known as: CYMBALTA Take 1 capsule (30 mg total) by mouth daily.   ferrous sulfate 325 (65 FE) MG tablet Take 1 tablet (325 mg total) by mouth 2 (two) times daily with a meal. What changed: when to take this   finasteride 5 MG tablet Commonly known as: PROSCAR Take 1 tablet (5 mg total) by mouth daily.   gabapentin 300 MG capsule Commonly known as: NEURONTIN Take 1 capsule (300 mg total) by mouth 2 (two) times daily.   hydrALAZINE 100 MG tablet Commonly known as: APRESOLINE Take 1 tablet (100 mg total) by mouth 3 (three) times daily. What changed:  medication strength  how much to take  when to take this   hydrochlorothiazide 12.5 MG capsule Commonly known as: MICROZIDE Take 1 capsule (12.5 mg total) by mouth daily. Start taking on: January 11, 2020 Replaces: hydrochlorothiazide 12.5 MG tablet   Insulin Syringe-Needle U-100 31G X 5/16" 1 ML Misc Commonly known as: TRUEplus Insulin Syringe Use daily as directed. Dispense needles as prescribed in the past. 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   Lantus 100 UNIT/ML injection Generic drug: insulin  glargine Inject 20 Units into the skin at bedtime as needed (for high blood sugar). What changed: Another medication with the same name was added. Make sure you understand how and when to take each.   insulin glargine 100 UNIT/ML injection Commonly known as: Lantus Inject 0.2 mLs (20 Units total) into the skin daily. Start taking on: January 11, 2020 What changed: You were already taking a medication with the same name, and this prescription was added. Make sure you understand how and when to take each.   memantine 10 MG tablet Commonly known as: NAMENDA TAKE 1 TABLET BY MOUTH TWICE A DAY What changed: when to take this   nitroGLYCERIN 0.4 MG SL tablet Commonly known as: NITROSTAT Place 1 tablet (0.4 mg total) under the tongue every 5 (five) minutes x 3 doses as needed for chest pain.   pantoprazole 40 MG tablet Commonly known as: PROTONIX Take 1 tablet (40 mg total) by mouth daily. Start taking on: January 11, 2020   Resource ThickenUp Clear Powd Take 120 g by mouth as needed (for nectar thick liquids).   ticagrelor 90 MG Tabs tablet Commonly known as: BRILINTA Take 1 tablet (90 mg total) by mouth 2 (two) times daily.            Discharge Care Instructions  (From admission, onward)         Start     Ordered   01/10/20 0000  Discharge wound care:       Comments: Wound care  Daily      Comments: Place a small piece of Xeroform gauze Kellie Simmering [phone]) between the right lateral toe and the right middle toe. Place dry gauze around the middle toe. Secure with kerlex.  Wound care  Daily      Comments: Cleanse gluteal fold, pat dry. Cut a narrow strip of Aquacel Kellie Simmering [phone]) and lay in the gluteal fold. Cover with a sacral foam dressing. Change the Aquacel strip daily and prn.  The foam dressing can be used up to 3 days.   01/10/20 1221   01/07/20 0000  Discharge wound care:       Comments: Wound care  Daily      Comments: Place a small piece of Xeroform gauze Kellie Simmering  [phone]) between the right lateral toe and the right middle toe. Place dry gauze around the middle toe. Secure with kerlex.  12/31/19 0906    12/31/19 0907   Wound care  Daily      Comments: Cleanse gluteal fold, pat dry. Cut a narrow strip of Aquacel Kellie Simmering [phone]) and lay in the gluteal fold. Cover with a sacral foam dressing. Change the Aquacel strip daily and prn.  The foam dressing can be used up to 3 days.   01/07/20 1126   01/05/20 0000  Discharge wound care:       Comments: Wound care  Daily  Comments: Place a small piece of Xeroform gauze Kellie Simmering [phone]) between the right lateral toe and the right middle toe. Place dry gauze around the middle toe. Secure with kerlex.   Wound care  Daily      Comments: Cleanse gluteal fold, pat dry. Cut a narrow strip of Aquacel Kellie Simmering [phone]) and lay in the gluteal fold. Cover with a sacral foam dressing. Change the Aquacel strip daily and prn.  The foam dressing can be used up to 3 days.   01/05/20 1532          LABORATORY STUDIES CBC    Component Value Date/Time   WBC 8.8 01/10/2020 0444   RBC 3.16 (L) 01/10/2020 0444   HGB 8.4 (L) 01/10/2020 0444   HCT 26.4 (L) 01/10/2020 0444   PLT 275 01/10/2020 0444   MCV 83.5 01/10/2020 0444   MCH 26.6 01/10/2020 0444   MCHC 31.8 01/10/2020 0444   RDW 17.1 (H) 01/10/2020 0444   LYMPHSABS 1.4 01/04/2020 0249   MONOABS 0.8 01/04/2020 0249   EOSABS 0.2 01/04/2020 0249   BASOSABS 0.0 01/04/2020 0249   CMP    Component Value Date/Time   NA 138 01/10/2020 0444   K 3.9 01/10/2020 0444   CL 103 01/10/2020 0444   CO2 22 01/10/2020 0444   GLUCOSE 116 (H) 01/10/2020 0444   BUN 28 (H) 01/10/2020 0444   CREATININE 1.39 (H) 01/10/2020 0444   CALCIUM 8.2 (L) 01/10/2020 0444   PROT 5.0 (L) 01/08/2020 0313   ALBUMIN 2.4 (L) 01/08/2020 0313   AST 28 01/08/2020 0313   ALT 39 01/08/2020 0313   ALKPHOS 60 01/08/2020 0313   BILITOT 0.5 01/08/2020 0313   GFRNONAA 49 (L) 01/10/2020 0444   GFRAA 52  (L) 11/09/2019 1030   COAGS Lab Results  Component Value Date   INR 1.2 12/30/2019   INR 1.1 11/29/2019   INR 1.1 07/03/2019   Lipid Panel    Component Value Date/Time   CHOL 85 12/31/2019 0318   TRIG 152 (H) 12/31/2019 0318   HDL 44 12/31/2019 0318   CHOLHDL 1.9 12/31/2019 0318   VLDL 30 12/31/2019 0318   LDLCALC 11 12/31/2019 0318   HgbA1C  Lab Results  Component Value Date   HGBA1C 6.0 (H) 12/31/2019   Urinalysis    Component Value Date/Time   COLORURINE YELLOW 12/31/2019 1806   APPEARANCEUR HAZY (A) 12/31/2019 1806   APPEARANCEUR Clear 03/31/2019 1142   LABSPEC 1.032 (H) 12/31/2019 1806   PHURINE 5.0 12/31/2019 1806   GLUCOSEU NEGATIVE 12/31/2019 1806   HGBUR NEGATIVE 12/31/2019 1806   BILIRUBINUR NEGATIVE 12/31/2019 1806   BILIRUBINUR Negative 03/31/2019 1142   KETONESUR NEGATIVE 12/31/2019 1806   PROTEINUR 100 (A) 12/31/2019 1806   UROBILINOGEN 0.2 04/18/2014 1426   NITRITE NEGATIVE 12/31/2019 1806   LEUKOCYTESUR NEGATIVE 12/31/2019 1806   Urine Drug Screen     Component Value Date/Time   LABOPIA NONE DETECTED 12/31/2019 1806   COCAINSCRNUR NONE DETECTED 12/31/2019 1806   LABBENZ NONE DETECTED 12/31/2019 1806   AMPHETMU NONE DETECTED 12/31/2019 1806   THCU NONE DETECTED 12/31/2019 1806   LABBARB NONE DETECTED 12/31/2019 1806    Alcohol Level    Component Value Date/Time   ETH <10 12/30/2019 2315    SIGNIFICANT DIAGNOSTIC STUDIES  CT HEAD CODE STROKE WO CONTRAST 12/30/2019 1. No acute intracranial infarct or other abnormality.  2. ASPECTS is 10.  3. Age-appropriate cerebral atrophy with mild chronic small vessel ischemic disease.    CT  Code Stroke CTA Head W/WO contrast CT Code Stroke CTA Neck W/WO contrast 12/31/2019 1. Negative CTA for emergent large vessel occlusion.  2. Approximate 8-9 mm severe near occlusive stenosis of the proximal right M1 segment. Right MCA branches attenuated but patent distally with no visible downstream occlusion.   3. Additional severe proximal right A1 stenosis.  4. Atheromatous change about the carotid bifurcations with associated stenoses of up to 50% on the right and 45% on the left.  5. Atheromatous change about the right carotid siphon with associated moderate to severe multifocal stenoses.  6. Small amount of secretions within the proximal right mainstem bronchus, suggesting that this patient is at risk for aspiration.    MR BRAIN WO CONTRAST 12/31/2019 1. Patchy and hazy diffusion abnormality involving the right insular cortex, subinsular white matter, and overlying right frontal operculum, consistent with acute right MCA territory ischemia. No associated hemorrhage or mass effect. Finding is consistent with the previously identified severe right M1 stenosis.  2. No other acute intracranial abnormality.  3. Age-related cerebral atrophy with mild chronic small vessel ischemic disease.   CT HEAD WO CONTRAST - post tPA 01/01/20 1. Patchy evolving small volume acute ischemic infarcts involving the right subinsular white matter, adjacent insular cortex, and overlying operculum, corresponding with findings on prior MRI. No interval hemorrhage, mass effect, or other complication. 2. No other new acute intracranial abnormality.   CT HEAD WO CONTRAST 01/06/20 Low-density now evident in the deep insular cortex and underlying white matter tracks on the right consistent with acute infarction in that location shown by recent MRI. More widespread right insular cortex shows some loss of gray-white differentiation but no pronounced swelling. No evidence of regional hemorrhage.   DG Chest 2 View 01/09/2020 Minimal left basilar atelectasis. 12/30/2019 1. Stable cardiomegaly. No pulmonary venous congestion.  2. Low lung volumes.    Transthoracic Echocardiogram   1. Left ventricular ejection fraction, by estimation, is 60 to 65%. The left ventricle has normal function. The left ventricle has no regional wall  motion abnormalities. There is mild concentric left ventricular hypertrophy. Left ventricular diastolic parameters are consistent with Grade II diastolic dysfunction (pseudonormalization). Elevated left atrial pressure. The E/e' is 47.   2. Right ventricular systolic function is normal. The right ventricular size is normal. Tricuspid regurgitation signal is inadequate for assessing PA pressure.   3. Left atrial size was mildly dilated.   4. The mitral valve is degenerative. No evidence of mitral valve regurgitation. No evidence of mitral stenosis.   5. The aortic valve is tricuspid. Aortic valve regurgitation is not visualized. No aortic stenosis is present.   6. The inferior vena cava is normal in size with greater than 50% respiratory variability, suggesting right atrial pressure of 3 mmHg.   ECG - SR rate 70 BPM. (See cardiology reading for complete details)      HISTORY OF PRESENT ILLNESS Fenton Candee is a 86 y.o. male past medical history of coronary artery disease, CKD stage III, dementia, diabetes, diastolic dysfunction, peripheral vascular disease, prostate cancer, history of multiple syncopal episodes, presented to the emergency room for evaluation of sudden onset of slurred speech, difficulty swallowing and left-sided weakness. The patient was scheduled for a GI scoping today, but could not go ahead with the procedure because of severely elevated blood pressures with systolic in the 381O.  He was seen at [facility] emergency room, treated with clonidine and discharged home.  He was at home with last known well at around 9  PM on 12/30/2019 when the family noted that he had slurred speech-which was very different from his baseline speech and also had some left-sided weakness.  EMS was called and also noted some left-sided neglect on double simultaneous stimulation.  His blood pressures in route were systolics in the 81X and diastolics in the 91Y with a heart rate fluctuating between  30-80. Code stroke was activated in the field.  Patient was brought in and evaluated in the ED.  Initial examination did reveal an NIH stroke scale of 6 with some left arm drift, left facial weakness, dysarthria as well as extinction on double simultaneous stimulation but the symptoms improved as the patient got his CT and CTA.  He had no drift in his left arm.  After the MRI his symptoms started to recur with left arm drift.  He had extinction initially which improved and then recurred.  The CT head showed a near occlusive stenosis of the right M1 segment of the MCA.  He was sent in for a stat MRI because of symptoms that started to improve, and he was still within the window for IV TPA.  His modified Rankin score is a 4 at baseline and he would not be a candidate for intervention. MRI shows acute to hyperacute right MCA territory infarct without consistent flair hyperintensity.  Initially decision made not to TPA due to rapidly improving symptoms.  But then due to fluctuating symptoms, Dr. Rory Percy discussed in extensive detail, the risks and benefits of IV TPA with his daughter-before making a decision with her consent to give IV TPA. He required blood pressure treated with Cleviprex before he was able to receive TPA.   Patient denies any chest pain.  His troponins were mildly elevated at [facility] this morning.  Family is concerned that there might be some sort of cardiac event going on with a stroke as well. Family also notes that he has had stroke/TIA-like symptoms during last time when he was preparing for his colonoscopy and was dehydrated.  1 of those times he had slurred speech and left-sided weakness as well.  They cannot be sure that all the symptoms each time were left-sided only.  He is on Plavix at home-which he had discontinued for 5 days as he was preparing for the GI procedure.    HOSPITAL COURSE Mr. Shlok Raz is a 86 y.o. male with history of coronary artery disease, CKD stage  III, dementia, diabetes, diastolic dysfunction, peripheral vascular disease, prostate cancer, history of multiple syncopal episodes, presented to the emergency room for evaluation of sudden onset of slurred speech, difficulty swallowing and left-sided weakness. He was scheduled for a GI scoping today, but could not go ahead with the procedure because of severely elevated blood pressures with systolic in the 782N.  His Plavix was on hold for procedure. The patient received IV t-PA Friday 12/31/19 at [place].   Stroke: acute right MCA pachy infarcts with right M1 occlusion s/p tPA - cardioembolic vs. Large vessel disease source  CT Head - No acute intracranial infarct or other abnormality. ASPECTS is 10.   CTA H&N - Approximate 8-9 mm severe near occlusive stenosis of the proximal right M1 segment. Right MCA branches attenuated but patent distally with no visible downstream occlusion. Additional severe proximal right A1 stenosis. ICA stenosis up to 50% on the right and 45% on the left. Atheromatous change about the right carotid siphon with associated moderate to severe multifocal stenoses.  MRI head - Patchy and hazy diffusion  abnormality involving the right insular cortex, subinsular white matter, and overlying right frontal operculum, consistent with acute right MCA territory ischemia. No associated hemorrhage or mass effect. Finding is consistent with the previously identified severe right M1 stenosis.   CT head - 11/20 and 11/25 - Patchy evolving small volume acute ischemic infarcts involving the right subinsular white matter, adjacent insular cortex, and overlying operculum, corresponding with findings on prior MRI. No interval hemorrhage  2D Echo - EF 60 - 65%. No cardiac source of emboli identified.   Consider 30 day cardiac event monitoring as outpt to rule out afib  [employer] Virus 2 - negative  LDL - 11  HgbA1c - 6.0  UDS neg  VTE prophylaxis - SCDs  aspirin 81 mg daily and  clopidogrel 75 mg daily (Plavix held 5 days for colonoscopy) prior to admission, now on Aspirin 81 mg daily and brillinta. Per Dr. Leonie Man, continue DAPT for 3 months, then brillinta alone.     Therapy recommendations:  CIR  Disposition:  CIR today   CAD s/p stent 5/21 Elevated troponin  Troponin 35->50->215>209  EKG no ST-T change  TTE - EF 60 - 65%. No cardiac source of emboli identified.   Cardiology on board - no ischemic workup planned  On DAPT , continue on d/c   Anemia of chronic disease   Hgb - 8.2->7.5->7.5->1u PRBC ->8.4  FOBT  positive   Increase iron pill to bid  Iron 31, TIBC 189, Sat 16 (all low) and ferritin normal    On PPI daily  11/18 GI admission for EGD/colonoscopy 2/2 anemia and diarrhea  Appreciate GI consult from Dr Paulita Fujita. No procedure recommended - OK to continue DAPT  CBC monitoring  Hypertensive Urgency Bradycardia on BB  Home BP meds: Cozaar ; amlodipine ; HCTZ ; Coreg ; hydralazine ; Cardura  BP remains up this am - po meds again increased  Off cleviprex - now on Coreg 6.25mg  bid; Norvasc 10mg  daily ; imdur 60mg  daily; Cardura 4mg  daily ; and hydralazine 100mg  Tid  Avoid labetalol if HR down, discussed with RN  Cardiology Dr. Percival Spanish is on board  Long-term BP goal normotensive   Hyperlipidemia  Home Lipid lowering medication: Lipitor 80 mg daily  LDL 11, goal < 70  Current lipid lowering medication: decrease to lipitor 40   Continue statin at discharge   Diabetes type II Hypoglycemia  Home diabetic meds: lantus 20 q hs prn  Current diabetic meds: SSI   HgbA1c 6.0, goal < 7.0  Controlled  CBG monitoring  On lantus 10 q hs->10 q day  Follow up with PCP  Hypoglycemia 45 yesterday with trend of low glucose in the morning. Change Lantus Qhs to Qday. Glucose much improved   Dysphagia  At Risk Aspiration  Speech on board  Passed MBS  On regular, nectar thick liquids  Off IVF  Afebrile and no  leukocytosis  CXR 11/28 Minimal left basilar atelectasis.  Other Stroke Risk Factors  Advanced age  Former cigarette smoker - quit  ETOH use, advised to drink no more than 1 alcoholic beverage per day.  Hx stroke/TIA  Coronary artery disease s/p orbital atherectomy and DES 5/21. Put on DAPT x 12 mo    Other Active Problems  Dementia on Aricept and nemanda - continue   Hx of multiple syncopal episode  CKD - stage 3a - Creatinine - 1.30->1.47->1.69->2.30->1.86->1.52->1.34->1.39   necrotic R foot 2nd toe w/o planned amputation per podiatry, VVS 12/2019. dsg changes daily  Chronic diarrhea - C-diff quick screen ordered 01/01/20 - negatvie - stopped enteric isolation. GI consulted, seems diarrhea improving, no procedure recommended  Deconditioning   Chronic back pain - bed position makes pt back hurts  DISCHARGE EXAM Blood pressure (!) 166/58, pulse 62, temperature (!) 97.5 F (36.4 C), temperature source Oral, resp. rate 20, height 5\' 7"  (1.702 m), weight 72.5 kg, SpO2 97 %. General - Well nourished, well developed elderly male, in no apparent distress.  Ophthalmologic - fundi not visualized due to noncooperation.  Cardiovascular - Regular rhythm and rate.  Mental Status -  Level of arousal and orientation to time, place, and person were intact.  There is minimal dysarthria noted. Language including expression, naming, repetition, comprehension was assessed and found intact.  Cranial Nerves II - XII - II - Visual field intact OU. III, IV, VI - Extraocular movements intact. V - Facial sensation intact bilaterally. VII - mild left facial droop. VIII - Hearing & vestibular intact bilaterally. X - Palate elevates symmetrically. XI - Chin turning & shoulder shrug intact bilaterally. XII - Tongue protrusion intact.  Motor Strength - The patient's strength was normal in all extremities except mild left UE pronator drift.  There is clawhand deformity of the hands  bilaterally more profound on the right side Likely from prior ulnar nerve damage at the elbow.  There is marked global atrophy of the intrinsic hand muscles on the right and mild on the left.  These are associated with weakness of the hand muscles bilaterally. Motor Tone - Muscle tone was assessed at the neck and was normal.  Reflexes - The patient's reflexes were symmetrical in all extremities and he had no pathological reflexes.  Sensory - Light touch, temperature/pinprick were assessed and were symmetrical.    Coordination - The patient had normal movements in the hands with no ataxia or dysmetria on right side. Mild left sided ataxia.    Gait and Station - deferred.  DISCHARGE DIET:  Low sodium heart healthy w/ nectar thick liquids   DISCHARGE PLAN  Disposition:  CIR  aspirin 81 mg daily and Brilinta (ticagrelor) 90 mg bid for secondary stroke prevention for 3 months then BRILINTA alone.  Ongoing stroke risk factor control by Primary Care Physician at time of discharge  30 d montior to look for AF as possible source of stroke arranged by cardiology - they will mail to pt  Follow-up PCP Lesleigh Noe, MD in 2 weeks following rehab stay  Follow-up in [facility] in 4 weeks following rehab stay, office to schedule an appointment.   Follow-up [name], MD (cardiology) 02/23/2020 at 1500    45 minutes were spent preparing discharge.  Rosalin Hawking, MD PhD Stroke Neurology 01/10/2020 1:27 PM

## 2020-01-05 NOTE — Progress Notes (Signed)
Pt shown to have increased slurred speech and slightly weaker on left side during assessment than previously upon waking. Pts BP at 112/42 at this time with continuing HR in mid to high 40s since this morning. Called and spoke to NP [employer]. Laid patient back in recliner and increased fluids to 169ml/hr x4 hours per verbal order from [place]. Pt's slurred speech resolved while on the phone with Ivin Booty. Will continue to monitor.

## 2020-01-05 NOTE — Progress Notes (Addendum)
Progress Note  Patient Name: Peter [name] Date of Encounter: 01/05/2020  Primary Cardiologist: [name], MD   Subjective   Up to chair, working with PT. BP remains elevated   Inpatient Medications    Scheduled Meds: . allopurinol  100 mg Oral Daily  . amLODipine  10 mg Oral Daily  . aspirin EC  81 mg Oral Daily  . atorvastatin  40 mg Oral Daily  . carvedilol  6.25 mg Oral BID WC  . Chlorhexidine Gluconate Cloth  6 each Topical Daily  . cloNIDine  0.1 mg Oral TID  . docusate sodium  100 mg Oral Daily  . donepezil  10 mg Oral Daily  . doxazosin  4 mg Oral Daily  . DULoxetine  30 mg Oral Daily  . ferrous sulfate  325 mg Oral Daily  . finasteride  5 mg Oral Daily  . gabapentin  300 mg Oral BID  . hydrALAZINE  100 mg Oral TID  . insulin aspart  0-15 Units Subcutaneous TID WC  . insulin aspart  0-5 Units Subcutaneous QHS  . isosorbide mononitrate  60 mg Oral Daily  . labetalol  20 mg Intravenous Once  . memantine  10 mg Oral BID  . multivitamin with minerals  1 tablet Oral Daily  . ticagrelor  90 mg Oral BID   Continuous Infusions: . sodium chloride    . sodium chloride 50 mL/hr at 01/04/20 2000   PRN Meds: acetaminophen **OR** acetaminophen (TYLENOL) oral liquid 160 mg/5 mL **OR** acetaminophen, labetalol, Resource ThickenUp Clear, senna-docusate   Vital Signs    Vitals:   01/05/20 0700 01/05/20 0733 01/05/20 0743 01/05/20 0810  BP: (!) 190/55 (!) 199/60  (!) 194/53  Pulse: (!) 57 62  64  Resp: 16     Temp:   98 F (36.7 C)   TempSrc:   Oral   SpO2: 99%       Intake/Output Summary (Last 24 hours) at 01/05/2020 0856 Last data filed at 01/05/2020 0700 Gross per 24 hour  Intake 632.45 ml  Output 450 ml  Net 182.45 ml   There were no vitals filed for this visit.  Physical Exam   General: Well developed, well nourished, NAD Neck: Negative for carotid bruits. No JVD Lungs:Clear to ausculation bilaterally. No wheezes, rales, or rhonchi.  Breathing is unlabored. Cardiovascular: RRR with S1 S2. No murmurs Extremities: No edema. Radial pulses 2+ bilaterally Neuro: Alert and oriented. MAE spontaneously. Psych: Responds to questions appropriately with normal affect.    Labs    Chemistry Recent Labs  Lab 12/30/19 2315 12/30/19 2324 01/02/20 0408 01/03/20 0147 01/04/20 0249  NA 138   < > 138 138 139  K 3.6   < > 3.7 4.1 3.9  CL 106   < > 107 108 109  CO2 23   < > 21* 23 20*  GLUCOSE 221*   < > 155* 203* 166*  BUN 18   < > 24* 32* 40*  CREATININE 1.47*   < > 1.69* 2.30* 1.86*  CALCIUM 8.0*   < > 7.7* 7.7* 7.7*  PROT 5.0*  --   --   --   --   ALBUMIN 2.6*  --   --   --   --   AST 22  --   --   --   --   ALT 24  --   --   --   --   ALKPHOS 70  --   --   --   --  BILITOT 0.3  --   --   --   --   GFRNONAA 46*   < > 39* 27* 35*  ANIONGAP 9   < > 10 7 10    < > = values in this interval not displayed.     Hematology Recent Labs  Lab 01/02/20 0408 01/03/20 0147 01/04/20 0249  WBC 8.7 10.1 9.3  RBC 3.26* 3.22* 3.08*  HGB 8.7* 8.5* 8.2*  HCT 27.2* 26.5* 25.5*  MCV 83.4 82.3 82.8  MCH 26.7 26.4 26.6  MCHC 32.0 32.1 32.2  RDW 17.3* 17.7* 17.9*  PLT 269 273 255    Cardiac EnzymesNo results for input(s): TROPONINI in the last 168 hours. No results for input(s): TROPIPOC in the last 168 hours.   BNPNo results for input(s): BNP, PROBNP in the last 168 hours.   DDimer No results for input(s): DDIMER in the last 168 hours.   Radiology    No results found.  Telemetry    NSR - Personally Reviewed  ECG    No new tracing as of 01/05/20- Personally Reviewed  Cardiac Studies   Echo 12/31/19: 1. Left ventricular ejection fraction, by estimation, is 60 to 65%. The  left ventricle has normal function. The left ventricle has no regional  wall motion abnormalities. There is mild concentric left ventricular  hypertrophy. Left ventricular diastolic  parameters are consistent with Grade II diastolic dysfunction   (pseudonormalization). Elevated left atrial pressure. The E/e' is 63.  2. Right ventricular systolic function is normal. The right ventricular  size is normal. Tricuspid regurgitation signal is inadequate for assessing  PA pressure.  3. Left atrial size was mildly dilated.  4. The mitral valve is degenerative. No evidence of mitral valve  regurgitation. No evidence of mitral stenosis.  5. The aortic valve is tricuspid. Aortic valve regurgitation is not  visualized. No aortic stenosis is present.  6. The inferior vena cava is normal in size with greater than 50%  respiratory variability, suggesting right atrial pressure of 3 mmHg.    Coronary stent intervention 07/05/19: Conclusions: 1. Severely diseased proximal and mid LAD with multifocal stenoses of up to 80-90% and heavy calcification. 2. Successful IVUS-guided orbital atherectomy and PCI of the ostial through mid LAD using a Synergy 3.0 x 48 mm drug-eluting stent (postdilated up to 3.9 mm) with less than 10% residual stenosis and TIMI-3 flow.  Recommendations: 1. Continue dual antiplatelet therapy with aspirin and clopidogrel for at least 12 months. 2. Titrate nitroglycerin for relief of chest pain that occurred during atherectomy and balloon inflation as well as for improved blood pressure control. 3. Remove left femoral artery sheath with manual compression once ACT has fallen below 175 seconds. 4. Aggressive secondary prevention.  Patient Profile     86 y.o. male with a history of peripheral arterial disease with multiple prior amputations, CAD s/p RCA and LAD PCI, T2Dm, HTN, CKD3, and prostatic adenocarcinoma admitted s/p TPA in setting of code stroke.  Assessment & Plan    1. Elevated troponin/hypertensive urgency: -Trop elevation in the setting of HTN urgency>>denies anginal symptoms, no chest pain  -Cleviprex off however BP remains markedly elevated with SBPs in the 180-200 range.  -Discussion with pharmacy and  neurology this AM with plans for clonidine per primary team   -HR limiting much titration on beta blocker -Echo with preserved EF and no WMA -DAPT has been resumed  2. Acute CVA -Received TPA, head CT stable -DAPT resumed -30 day event monitor ordered to rule  out atrial arrhythmia  3. CAD -Demand ischemia this admission>>>no chest pain  -Cath May 2021 with orbital atherectomy and DES of ostial through mid LAD -DAPT x 12 months  4. Hypertension -IV Cleviprex stopped>>clonidine added per primary team today given markedly elevated pressures with SBPs in the 180-200 range -Currently on hydralazine 100, clonidine 0.1, carvedilol 6.25, amlodipine 10 -Once renal function improves>>can restart ACE/ARB -Creatinine improved from yesterday   5. Hyperlipidemia: -Lipitor was decreased to 40 mg by neuro -Goal LDL <70  6. CKD stage III: -Creatinine improved to 1.86 today from 2.30 yesterday -Baseline appears to be around 1.9     Signed, Kathyrn Drown NP-C [facility] Pager: [phone] 01/05/2020, 8:56 AM     For questions or updates, please contact   Please consult [email or web address] for contact info under Cardiology/STEMI.  History and all data above reviewed.  Patient examined.  I agree with the findings as above.  He is off IV.  Feels OK.  No pain.  No SOB.  The patient exam reveals COR:RRR  ,  Lungs: Clear  ,  Abd: Positive bowel sounds, no rebound no guarding, Ext No edema  .  All available labs, radiology testing, previous records reviewed. Agree with documented assessment and plan.  Agree with clonidine.  BP is now trending down.  We will follow as needed.    Jeneen Rinks Emilina Smarr  11:16 AM  01/05/2020

## 2020-01-05 NOTE — Progress Notes (Signed)
STROKE TEAM PROGRESS NOTE   INTERVAL HISTORY Patient is sitting in the bedside chair.  He states is doing fine.  His blood pressure continues to be significantly elevated and clonidine has been added this morning with plans also to increase hydralazine.  He has been cleared by therapy to go home with home therapies but blood pressure control continues to be an issue.  OBJECTIVE Vitals:   01/05/20 1142 01/05/20 1200 01/05/20 1300 01/05/20 1400  BP:  (!) 112/42 (!) 132/37 (!) 160/56  Pulse:  (!) 47 (!) 48 (!) 54  Resp:  19 17 17   Temp: (!) 97.2 F (36.2 C)     TempSrc: Oral     SpO2:  100% 100% 100%   CBC:  Recent Labs  Lab 12/30/19 2315 12/30/19 2324 01/03/20 0147 01/04/20 0249  WBC 7.0   < > 10.1 9.3  NEUTROABS 4.9  --   --  6.9  HGB 8.7*   < > 8.5* 8.2*  HCT 27.9*   < > 26.5* 25.5*  MCV 85.3   < > 82.3 82.8  PLT 235   < > 273 255   < > = values in this interval not displayed.   Basic Metabolic Panel:  Recent Labs  Lab 01/04/20 0249 01/05/20 1145  NA 139 137  K 3.9 4.0  CL 109 109  CO2 20* 20*  GLUCOSE 166* 241*  BUN 40* 37*  CREATININE 1.86* 1.52*  CALCIUM 7.7* 7.8*   Lipid Panel:     Component Value Date/Time   CHOL 85 12/31/2019 0318   TRIG 152 (H) 12/31/2019 0318   HDL 44 12/31/2019 0318   CHOLHDL 1.9 12/31/2019 0318   VLDL 30 12/31/2019 0318   LDLCALC 11 12/31/2019 0318   HgbA1c:  Lab Results  Component Value Date   HGBA1C 6.0 (H) 12/31/2019   Urine Drug Screen:     Component Value Date/Time   LABOPIA NONE DETECTED 12/31/2019 1806   COCAINSCRNUR NONE DETECTED 12/31/2019 1806   LABBENZ NONE DETECTED 12/31/2019 1806   AMPHETMU NONE DETECTED 12/31/2019 1806   THCU NONE DETECTED 12/31/2019 1806   LABBARB NONE DETECTED 12/31/2019 1806    Alcohol Level     Component Value Date/Time   ETH <10 12/30/2019 2315    IMAGING  CT Code Stroke CTA Head W/WO contrast CT Code Stroke CTA Neck W/WO contrast 12/31/2019 IMPRESSION:  1. Negative CTA for  emergent large vessel occlusion.  2. Approximate 8-9 mm severe near occlusive stenosis of the proximal right M1 segment. Right MCA branches attenuated but patent distally with no visible downstream occlusion.  3. Additional severe proximal right A1 stenosis.  4. Atheromatous change about the carotid bifurcations with associated stenoses of up to 50% on the right and 45% on the left.  5. Atheromatous change about the right carotid siphon with associated moderate to severe multifocal stenoses.  6. Small amount of secretions within the proximal right mainstem bronchus, suggesting that this patient is at risk for aspiration.   MR BRAIN WO CONTRAST 12/31/2019 IMPRESSION:  1. Patchy and hazy diffusion abnormality involving the right insular cortex, subinsular white matter, and overlying right frontal operculum, consistent with acute right MCA territory ischemia. No associated hemorrhage or mass effect. Finding is consistent with the previously identified severe right M1 stenosis.  2. No other acute intracranial abnormality.  3. Age-related cerebral atrophy with mild chronic small vessel ischemic disease.   CT HEAD CODE STROKE WO CONTRAST 12/30/2019 IMPRESSION:  1. No acute intracranial  infarct or other abnormality.  2. ASPECTS is 10.  3. Age-appropriate cerebral atrophy with mild chronic small vessel ischemic disease.   CT HEAD WO CONTRAST - post tPA 01/01/20 IMPRESSION: 1. Patchy evolving small volume acute ischemic infarcts involving the right subinsular white matter, adjacent insular cortex, and overlying operculum, corresponding with findings on prior MRI. No interval hemorrhage, mass effect, or other complication. 2. No other new acute intracranial abnormality.  DG Chest 2 View 12/30/2019 IMPRESSION:  1. Stable cardiomegaly. No pulmonary venous congestion.  2. Low lung volumes.   Transthoracic Echocardiogram  1. Left ventricular ejection fraction, by estimation, is 60 to 65%. The  left ventricle has normal function. The left ventricle has no regional wall motion abnormalities. There is mild concentric left ventricular hypertrophy. Left ventricular diastolic parameters are consistent with Grade II diastolic dysfunction (pseudonormalization). Elevated left atrial pressure. The E/e' is 45.  2. Right ventricular systolic function is normal. The right ventricular size is normal. Tricuspid regurgitation signal is inadequate for assessing PA pressure.  3. Left atrial size was mildly dilated.  4. The mitral valve is degenerative. No evidence of mitral valve regurgitation. No evidence of mitral stenosis.  5. The aortic valve is tricuspid. Aortic valve regurgitation is not visualized. No aortic stenosis is present.  6. The inferior vena cava is normal in size with greater than 50% respiratory variability, suggesting right atrial pressure of 3 mmHg.  ECG - SR rate 70 BPM. (See cardiology reading for complete details)   PHYSICAL EXAM    General - Well nourished, well developed elderly male, in no apparent distress.  Ophthalmologic - fundi not visualized due to noncooperation.  Cardiovascular - Regular rhythm and rate.  Mental Status -  Level of arousal and orientation to time, place, and person were intact.  There is mild to moderate dysarthria noted. Language including expression, naming, repetition, comprehension was assessed and found intact.  Cranial Nerves II - XII - II - Visual field intact OU. III, IV, VI - Extraocular movements intact. V - Facial sensation intact bilaterally. VII - mild left facial droop. VIII - Hearing & vestibular intact bilaterally. X - Palate elevates symmetrically. XI - Chin turning & shoulder shrug intact bilaterally. XII - Tongue protrusion intact.  Motor Strength - The patient's strength was normal in all extremities except mild left UE pronator drift.  There is clawhand deformity of the hands bilaterally more profound on the right side  Likely from prior ulnar nerve damage at the elbow.  There is marked global atrophy of the intrinsic hand muscles on the right and mild on the left.  These are associated with weakness of the hand muscles bilaterally. Motor Tone - Muscle tone was assessed at the neck and was normal.  Reflexes - The patient's reflexes were symmetrical in all extremities and he had no pathological reflexes.  Sensory - Light touch, temperature/pinprick were assessed and were symmetrical.    Coordination - The patient had normal movements in the hands with no ataxia or dysmetria on right side. Mild left sided ataxia.   Gait and Station - deferred.   ASSESSMENT/PLAN [name] is a 86 y.o. male with history of coronary artery disease, CKD stage III, dementia, diabetes, diastolic dysfunction, peripheral vascular disease, prostate cancer, history of multiple syncopal episodes, presented to the emergency room for evaluation of sudden onset of slurred speech, difficulty swallowing and left-sided weakness. He was scheduled for a GI scoping today, but could not go ahead with the procedure because of  severely elevated blood pressures with systolic in the 626R.  His Plavix was on hold for procedure.  The patient received IV t-PA Friday 12/31/19 at [facility].  Stroke: acute right MCA pachy infarcts with right M1 occlusion s/p tPA - cardioembolic vs. Large vessel disease source  CT Head - No acute intracranial infarct or other abnormality. ASPECTS is 10.   CTA H&N - Approximate 8-9 mm severe near occlusive stenosis of the proximal right M1 segment. Right MCA branches attenuated but patent distally with no visible downstream occlusion. Additional severe proximal right A1 stenosis. ICA stenosis up to 50% on the right and 45% on the left. Atheromatous change about the right carotid siphon with associated moderate to severe multifocal stenoses.  MRI head - Patchy and hazy diffusion abnormality involving the right insular  cortex, subinsular white matter, and overlying right frontal operculum, consistent with acute right MCA territory ischemia. No associated hemorrhage or mass effect. Finding is consistent with the previously identified severe right M1 stenosis.   CT head repeat 24h post tPA - Patchy evolving small volume acute ischemic infarcts involving the right subinsular white matter, adjacent insular cortex, and overlying operculum, corresponding with findings on prior MRI. No interval hemorrhage  2D Echo - EF 60 - 65%. No cardiac source of emboli identified.   Consider 30 day cardiac event monitoring as outpt to rule out afib  [employer] Virus 2 - negative  LDL - 11  HgbA1c - 6.0  UDS neg  VTE prophylaxis - SCDs  aspirin 81 mg daily and clopidogrel 75 mg daily (Plavix held 5 days for colonoscopy) prior to admission, Aspirin 81 mg daily and Plavix 75 mg daily restarted 01/01/20, changed plavix to brillinta. Continue aspirin 81mg  and brillinta for 3 months, then discontinue aspirin and continue only with brillinta.    Therapy recommendations:  HH PT, [facility] OT  W/ 24/7 care  Disposition:  Pending  Plan transfer to floor vs d/c home depending on post hospital care plans  CAD s/p stent Elevated troponin  Troponin 35->50->215>209  EKG no ST-T change  TTE - EF 60 - 65%. No cardiac source of emboli identified.   Cardiology on board  On DAPT PTA  CT head repeat 24h post tPA 11/20 2:25 - Patchy evolving small volume acute ischemic infarcts involving the right subinsular white matter, adjacent insular cortex, and overlying operculum, corresponding with findings on prior MRI. No interval hemorrhage  On DAPT as above  Hypertensive Urgency Bradycardia on BB  Home BP meds: Cozaar ; amlodipine ; HCTZ ; Coreg ; hydralazine ; Cardura  BP remains up this am - po meds again increased  Now off cleviprex   Avoid labetalol if HR down, discussed with RN . Long-term BP goal  normotensive  Hyperlipidemia  Home Lipid lowering medication: Lipitor 80 mg daily  LDL 11, goal < 70  Current lipid lowering medication: decrease to lipitor 40   Continue statin at discharge  Diabetes  Home diabetic meds: insulin  Current diabetic meds: SSI   HgbA1c 6.0, goal < 7.0  Controlled  CBG monitoring  Follow up with PCP  Other Stroke Risk Factors  Advanced age  Former cigarette smoker - quit  ETOH use, advised to drink no more than 1 alcoholic beverage per day.  Hx stroke/TIA  Coronary artery disease  Dysphagia   Speech on board  Passed MBS  On dys 3 and thin  Gentle hydration  Other Active Problems  Code status - DNR   Dementia on  Aricept and nemanda - continue   Hx of multiple syncopal episode  CKD - stage 3a - Creatinine - 1.30->1.47->1.69->2.30->1.86  necrotic R foot 2nd toe w/o planned amputation per podiatry, [facility] 12/2019. dsg changes daily  Anemia of chronic disease - Hgb - 8.2  Liquid stool reported - C-diff quick screen ordered 01/01/20 - negatvie - stopped enteric isolation  Hypocalcemia - Calcium - 7.7  Hospital day # 5  Plan add clonidine 0.1 twice daily as well as increase hydralazine to 100 mg  times daily for better blood pressure control.  May consider discharge later today or tomorrow depending upon how his blood pressure does.  Discussed with pharmacist.  Greater than 50% time during this 25-minute visit was spent on counseling and coordination of care about his blood pressure management and stroke and answering questions Antony Contras, MD To contact Stroke Continuity provider, please refer to [email or web address] After hours, contact General Neurology

## 2020-01-05 NOTE — Plan of Care (Signed)
  Problem: Education: Goal: Knowledge of secondary prevention will improve Outcome: Progressing Goal: Knowledge of patient specific risk factors addressed and post discharge goals established will improve Outcome: Progressing   Problem: Coping: Goal: Will identify appropriate support needs Outcome: Progressing   Problem: Self-Care: Goal: Verbalization of feelings and concerns over difficulty with self-care will improve Outcome: Progressing Goal: Ability to communicate needs accurately will improve Outcome: Progressing   Problem: Nutrition: Goal: Dietary intake will improve Outcome: Progressing   Problem: Education: Goal: Knowledge of disease or condition will improve Outcome: Progressing Goal: Knowledge of secondary prevention will improve Outcome: Progressing Goal: Knowledge of patient specific risk factors addressed and post discharge goals established will improve Outcome: Progressing   Problem: Coping: Goal: Will identify appropriate support needs Outcome: Progressing   Problem: Health Behavior/Discharge Planning: Goal: Ability to manage health-related needs will improve Outcome: Progressing   Problem: Self-Care: Goal: Ability to participate in self-care as condition permits will improve Outcome: Progressing Goal: Verbalization of feelings and concerns over difficulty with self-care will improve Outcome: Progressing Goal: Ability to communicate needs accurately will improve Outcome: Progressing   Problem: Nutrition: Goal: Risk of aspiration will decrease Outcome: Progressing Goal: Dietary intake will improve Outcome: Progressing   Problem: Ischemic Stroke/TIA Tissue Perfusion: Goal: Complications of ischemic stroke/TIA will be minimized Outcome: Progressing   Problem: Education: Goal: Knowledge of General Education information will improve Description: Including pain rating scale, medication(s)/side effects and non-pharmacologic comfort measures Outcome:  Progressing   Problem: Health Behavior/Discharge Planning: Goal: Ability to manage health-related needs will improve Outcome: Progressing   Problem: Clinical Measurements: Goal: Ability to maintain clinical measurements within normal limits will improve Outcome: Progressing Goal: Will remain free from infection Outcome: Progressing   Problem: Activity: Goal: Risk for activity intolerance will decrease Outcome: Progressing   Problem: Nutrition: Goal: Adequate nutrition will be maintained Outcome: Progressing   Problem: Coping: Goal: Level of anxiety will decrease Outcome: Progressing   Problem: Elimination: Goal: Will not experience complications related to bowel motility Outcome: Progressing Goal: Will not experience complications related to urinary retention Outcome: Progressing   Problem: Pain Managment: Goal: General experience of comfort will improve Outcome: Progressing   Problem: Safety: Goal: Ability to remain free from injury will improve Outcome: Progressing

## 2020-01-05 NOTE — Progress Notes (Signed)
Physical Therapy Treatment Patient Details Name: Peter [name] MRN: [id number] DOB: April 04, 1930 Today's Date: 01/05/2020    History of Present Illness Pt is an 86 yo male presenting with acute onset of fluctuating slurred speech, L sided weakness, and difficulty swallowing s/p tPA. MRI concerning for R MCA territory ischemia. CTA revealed a small amount of secretions within the proximal R mainstem bronchus, suggestive of aspiration risk. PMH includes: prostate ca, PAD, MI, mild dementia, HTN, HLD, gout, GERD, DM, CAD, CKD     PT Comments    Pt progressing steadily toward goals.  Pt still having trouble building momentum for more ease of transitions to EOB, sit to stand and still ambulates tentatively with the RW.  Expect can further improve with HHPT.   Follow Up Recommendations  Home health PT;Supervision for mobility/OOB     Equipment Recommendations  None recommended by PT    Recommendations for Other Services       Precautions / Restrictions Precautions Precautions: Fall    Mobility  Bed Mobility Overal bed mobility: Needs Assistance Bed Mobility: Supine to Sit;Sit to Supine     Supine to sit: Min guard Sit to supine: Min guard      Transfers Overall transfer level: Needs assistance Equipment used: Rolling walker (2 wheeled) Transfers: Sit to/from Stand Sit to Stand: Min assist         General transfer comment: practice working on set up and hand placement.  cued/demo'd coming further forward before standing.  Still need some assist forward and boost.  Ambulation/Gait Ambulation/Gait assistance: Min guard Gait Distance (Feet): 170 Feet Assistive device: Rolling walker (2 wheeled) Gait Pattern/deviations: Step-through pattern;Decreased stride length;Trunk flexed   Gait velocity interpretation: <1.8 ft/sec, indicate of risk for recurrent falls General Gait Details: shorter stride, flexed posture, safe use of the RW   Stairs             Wheelchair  Mobility    Modified Rankin (Stroke Patients Only) Modified Rankin (Stroke Patients Only) Pre-Morbid Rankin Score: No significant disability Modified Rankin: Moderate disability     Balance Overall balance assessment: Needs assistance   Sitting balance-Leahy Scale: Fair     Standing balance support: Bilateral upper extremity supported Standing balance-Leahy Scale: Poor Standing balance comment: dependent on UE support                            Cognition Arousal/Alertness: Awake/alert Behavior During Therapy: WFL for tasks assessed/performed Overall Cognitive Status: History of cognitive impairments - at baseline                                        Exercises      General Comments        Pertinent Vitals/Pain Pain Assessment: Faces Faces Pain Scale: No hurt Pain Intervention(s): Monitored during session    Home Living                      Prior Function            PT Goals (current goals can now be found in the care plan section) Acute Rehab PT Goals Patient Stated Goal: get better, go home PT Goal Formulation: With patient Time For Goal Achievement: 01/14/20 Potential to Achieve Goals: Good Progress towards PT goals: Progressing toward goals    Frequency    Min 4X/week  PT Plan Current plan remains appropriate    Co-evaluation              AM-PAC PT "6 Clicks" Mobility   Outcome Measure  Help needed turning from your back to your side while in a flat bed without using bedrails?: None Help needed moving from lying on your back to sitting on the side of a flat bed without using bedrails?: A Little Help needed moving to and from a bed to a chair (including a wheelchair)?: A Little Help needed standing up from a chair using your arms (e.g., wheelchair or bedside chair)?: A Little Help needed to walk in hospital room?: A Little Help needed climbing 3-5 steps with a railing? : A Little 6 Click Score:  19    End of Session   Activity Tolerance: Patient tolerated treatment well Patient left: in bed;with call bell/phone within reach Nurse Communication: Mobility status PT Visit Diagnosis: Other abnormalities of gait and mobility (R26.89)     Time: 3546-5681 PT Time Calculation (min) (ACUTE ONLY): 38 min  Charges:  $Gait Training: 8-22 mins $Therapeutic Activity: 23-37 mins                     01/05/2020  Ginger Carne., PT Acute Rehabilitation Services [phone]  (pager) [phone]  (office)   Tessie Fass Syd Newsome 01/05/2020, 6:17 PM

## 2020-01-05 NOTE — TOC Initial Note (Addendum)
Transition of Care [facility]) - Initial/Assessment Note    Patient Details  Name: Peter [name] MRN: [id number] Date of Birth: 1930/02/14  Transition of [facility]) CM/SW Contact:    Ella Bodo, RN Phone Number: 01/05/2020, 4:35 PM  Clinical Narrative:                 Pt is an 86 yo male presenting with acute onset of fluctuating slurred speech, L sided weakness, and difficulty swallowing s/p tPA. MRI concerning for R MCA territory ischemia. CTA revealed a small amount of secretions within the proximal R mainstem bronchus, suggestive of aspiration risk. PTA, pt requires assistance with ADLs; he lives with his wife and has a Optician, dispensing 7 days a week.  He also has some assistance from his daughter.  PT/OT/and speech therapy recommending home health follow-up: Spoke with patient's daughter, Peter [name]; she states that patient is active with [facility] for home health PT.  She is agreeable to the addition of home health OT and home health speech therapy, if recommended.  Left message for provider to order home health services.  Addendum:  87 Spoke with [place] with [facility]; she states that patient is active with this agency for HHPT.  NP to enter orders for PT,OT and ST; Amy will add OT and ST to current PT and notify [facility] agency of additional orders.    Expected Discharge Plan: [place] Barriers to Discharge: Continued Medical Work up   Patient Goals and CMS Choice   CMS Medicare.gov Compare Post Acute Care list provided to:: Patient Represenative (must comment) (daughter) Choice offered to / list presented to : Adult Children  Expected Discharge Plan and Services Expected Discharge Plan: [place]   Discharge Planning Services: CM Consult Post Acute Care Choice: [place] arrangements for the past 2 months: Single Family Home Expected Discharge Date: 01/05/20                                    Prior  Living Arrangements/Services Living arrangements for the past 2 months: Single Family Home Lives with:: Spouse Patient language and need for interpreter reviewed:: Yes Do you feel safe going back to the place where you live?: Yes      Need for Family Participation in Patient Care: Yes (Comment) Care giver support system in place?: Yes (comment) Current home services: Home PT Criminal Activity/Legal Involvement Pertinent to Current Situation/Hospitalization: No - Comment as needed  Activities of Daily Living Home Assistive Devices/Equipment: None ADL Screening (condition at time of admission) Patient's cognitive ability adequate to safely complete daily activities?: Yes Is the patient deaf or have difficulty hearing?: No Does the patient have difficulty seeing, even when wearing glasses/contacts?: No Does the patient have difficulty concentrating, remembering, or making decisions?: Yes Patient able to express need for assistance with ADLs?: Yes Does the patient have difficulty dressing or bathing?: No Independently performs ADLs?: No Communication: Independent Dressing (OT): Needs assistance Is this a change from baseline?: Change from baseline, expected to last >3 days Grooming: Needs assistance Is this a change from baseline?: Change from baseline, expected to last >3 days Feeding: Independent with device (comment) (needs setup) Bathing: Needs assistance Is this a change from baseline?: Change from baseline, expected to last >3 days Toileting: Needs assistance Is this a change from baseline?: Change from baseline, expected to last >3days In/Out  Bed: Needs assistance Is this a change from baseline?: Change from baseline, expected to last >3 days Walks in Home: Needs assistance Is this a change from baseline?: Change from baseline, expected to last >3 days Does the patient have difficulty walking or climbing stairs?: Yes Weakness of Legs: Both Weakness of Arms/Hands:  Left  Permission Sought/Granted Permission sought to share information with : Family Supports Permission granted to share information with : Yes, Verbal Permission Granted              Emotional Assessment Appearance:: Appears stated age Attitude/Demeanor/Rapport: Engaged Affect (typically observed): Accepting Orientation: : Oriented to Self, Oriented to Place, Oriented to  Time, Oriented to Situation      Admission diagnosis:  Acute ischemic stroke ([place]) [I63.9] Labile blood pressure [R09.89] Patient Active Problem List   Diagnosis Date Noted  . Acute ischemic stroke ([place]) R MCA w/ R M1 occlusion s/p tPA, embolic vs large vessel disease source 12/31/2019  . Pressure injury of skin 12/31/2019  . Chronic constipation 12/09/2019  . Counseling regarding advance care planning and goals of care 12/09/2019  . Cellulitis of right lower leg 11/29/2019  . TIA (transient ischemic attack) 11/29/2019  . Necrotic toes ([place]) 11/29/2019  . Atherosclerosis of native arteries of extremities with gangrene, bilateral legs ([place]) 11/29/2019  . AKI (acute kidney injury) ([place]) 11/29/2019  . Anemia due to stage 3b chronic kidney disease ([place]) 10/25/2019  . DNR (do not resuscitate) discussion 10/14/2019  . Current moderate episode of major depressive disorder without prior episode ([place]) 10/14/2019  . Iron deficiency anemia 10/14/2019  . Cerebrovascular accident (CVA) ([place]) 10/08/2019  . Joint stiffness of right lower leg 10/04/2019  . Numbness and tingling in right hand 10/04/2019  . Right leg weakness 10/04/2019  . Memory loss or impairment 07/14/2019  . Weakness 07/14/2019  . NSTEMI (non-ST elevated myocardial infarction) ([place]) 07/02/2019  . Unstable angina ([place]) 06/27/2019  . CAD in native artery 06/27/2019  . Chest pain   . Elevated troponin level   . Polyneuropathy associated with underlying disease ([place]) 06/15/2019  . Right hand weakness 06/15/2019  . Peripheral vascular disease,  unspecified ([place]) 04/06/2019  . Postural urinary incontinence 03/01/2019  . Balance problem 03/01/2019  . Trigger middle finger of right hand 03/01/2019  . Diarrhea 12/28/2018  . Coronary artery disease involving native coronary artery of native heart without angina pectoris 09/15/2018  . Chronic SI joint pain 09/01/2018  . Bilateral hip pain 09/01/2018  . Chronic pain syndrome 09/01/2018  . Hx of myocardial infarction 06/15/2018  . Chronic bilateral low back pain 06/15/2018  . History of amputation of lesser toe of right foot ([place]) 06/15/2018  . Hx of malignant neoplasm of prostate 06/15/2018  . Atherosclerosis of native arteries of the extremities with gangrene ([place]) 11/04/2016  . Chronic pain of right knee 05/06/2016  . Peripheral neuropathy 06/15/2015  . Hyperlipidemia 06/15/2015  . Abdominal pain   . Hypertension 04/18/2014  . DM (diabetes mellitus) type II controlled with renal manifestation ([place]) 04/18/2014  . Chronic kidney disease, stage 3b ([place]) 04/18/2014  . Dementia ([place]) 04/18/2014  . Near syncope 04/18/2014  . Bradycardia 04/18/2014   PCP:  Lesleigh Noe, MD Pharmacy:   [id number] [place], [place] [address] Phone: [phone] Fax: [phone]  [facility] [place], [place] - [address] [place] [address] Phone: [phone] Fax: [phone]  [facility]  [place] [place] [facility] [address] [place] [address] Phone: [phone] Fax: [phone]     Social Determinants of Health (SDOH) Interventions    Readmission Risk Interventions Readmission Risk Prevention Plan 12/01/2019  Transportation Screening Complete  Medication Review ([place]) Complete  PCP or Specialist appointment within 3-5 days of discharge Complete  HRI or [place]  Complete  SW Recovery Care/Counseling Consult Complete  Palliative Care Screening Complete  [place] Not Applicable  Some recent data might be hidden   Reinaldo Raddle, RN, BSN  Trauma/Neuro ICU Case Manager [phone]

## 2020-01-06 ENCOUNTER — Inpatient Hospital Stay (HOSPITAL_COMMUNITY): Payer: Medicare Other

## 2020-01-06 ENCOUNTER — Encounter (HOSPITAL_COMMUNITY): Payer: Self-pay | Admitting: Student in an Organized Health Care Education/Training Program

## 2020-01-06 DIAGNOSIS — I639 Cerebral infarction, unspecified: Secondary | ICD-10-CM | POA: Diagnosis not present

## 2020-01-06 LAB — GLUCOSE, CAPILLARY
Glucose-Capillary: 151 mg/dL — ABNORMAL HIGH (ref 70–99)
Glucose-Capillary: 306 mg/dL — ABNORMAL HIGH (ref 70–99)
Glucose-Capillary: 95 mg/dL (ref 70–99)
Glucose-Capillary: 216 mg/dL — ABNORMAL HIGH (ref 70–99)

## 2020-01-06 IMAGING — CT CT HEAD W/O CM
3 series · 14 of 47 positions shown, 16 images · non-contrast
Comparison: [DATE] CT.  [DATE] MRI.

CLINICAL DATA: Follow-up stroke

EXAM:
CT HEAD WITHOUT CONTRAST
TECHNIQUE: Contiguous axial images were obtained from the base of the skull
through the vertex without intravenous contrast.

[Series 3: head 5.0 h30s · axial · 0.41mm/px · z∈[+1290,+1425]mm · 8 of 33 slices shown, 10 images]
[im 3/33  brain]
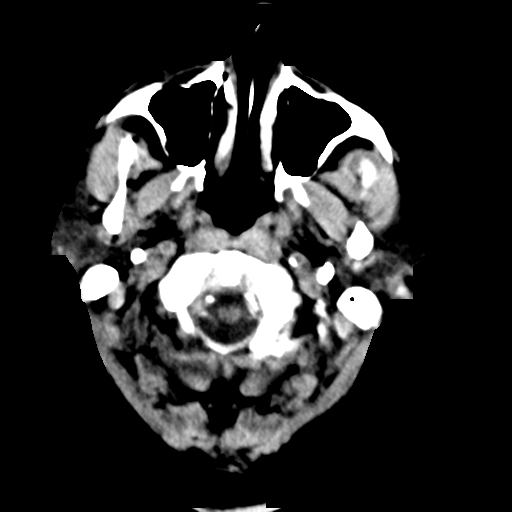
[im 3/33  bone]
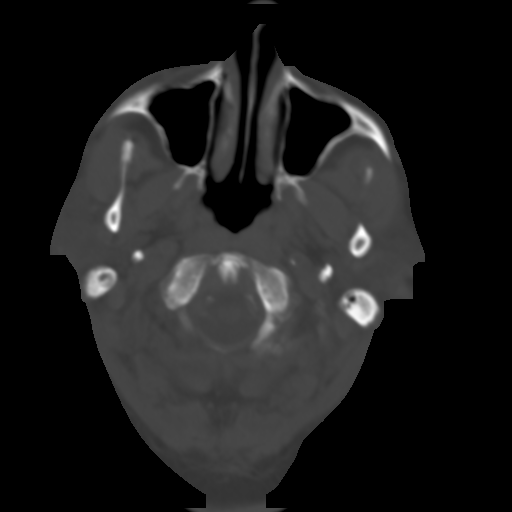
[im 7/33  brain]
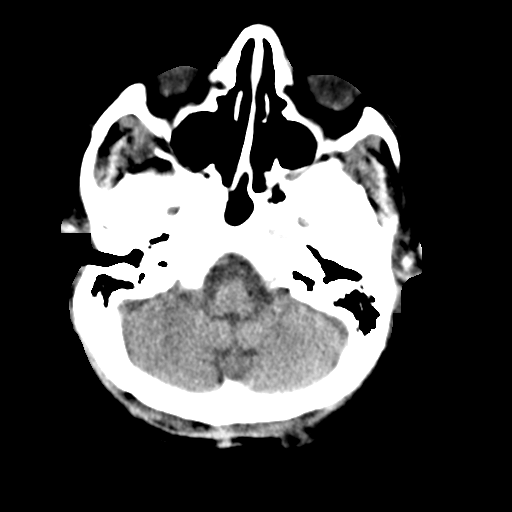
[im 10/33  brain]
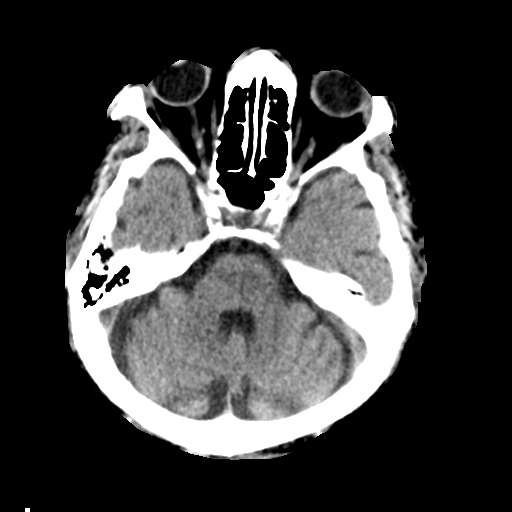
[im 15/33  brain]
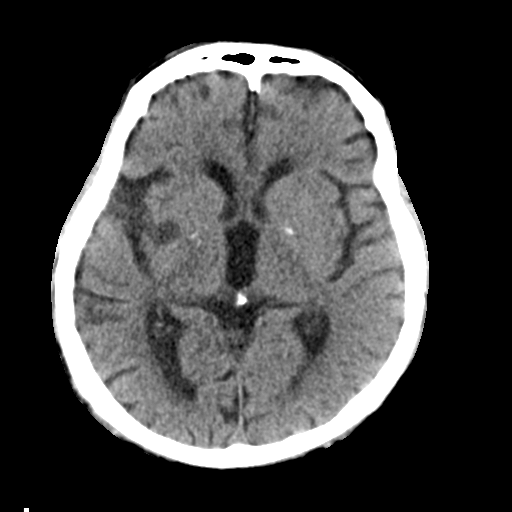
[im 18/33  brain]
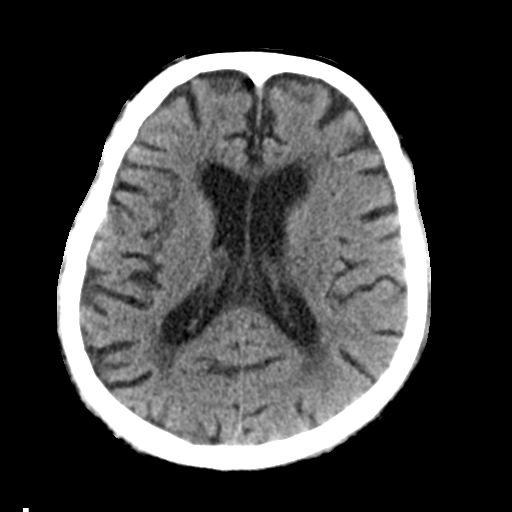
[im 18/33  bone]
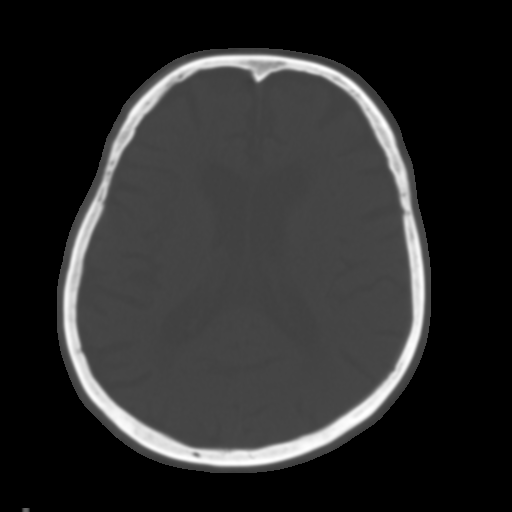
[im 23/33  brain]
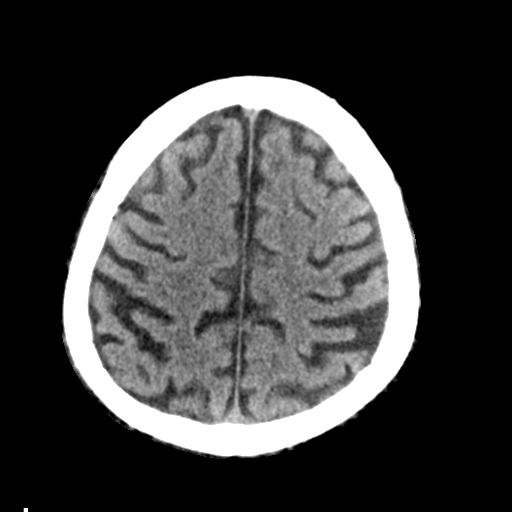
[im 26/33  brain]
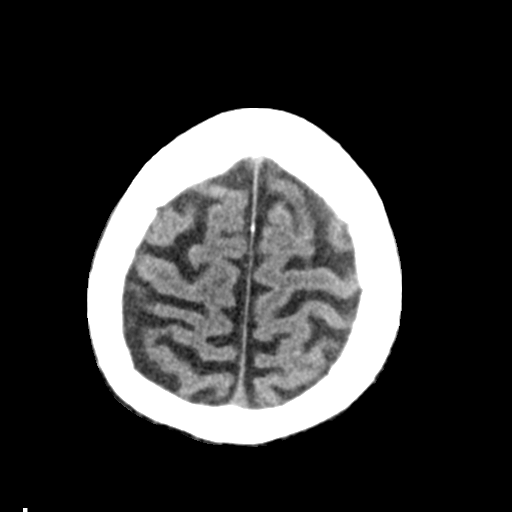
[im 30/33  brain]
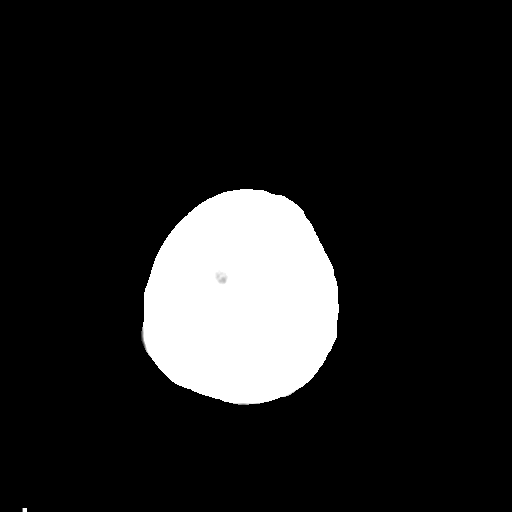

[Series 5: head 3.0 mpr cor · coronal · 0.33mm/px · 3 of 70 slices shown]
[im 24/70  brain]
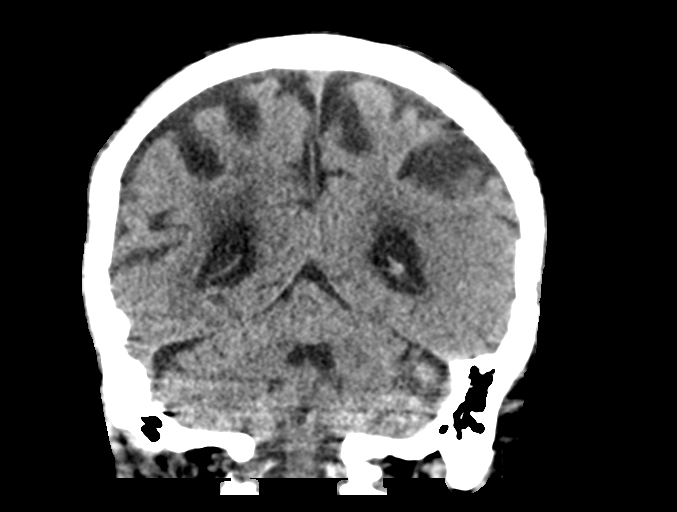
[im 31/70  brain]
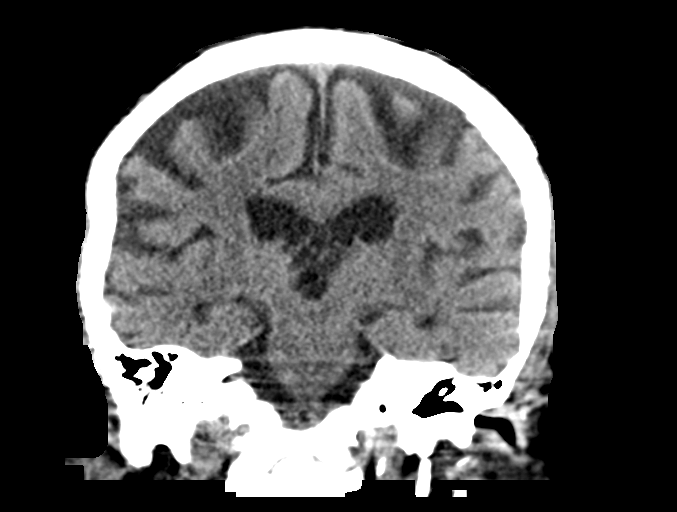
[im 39/70  brain]
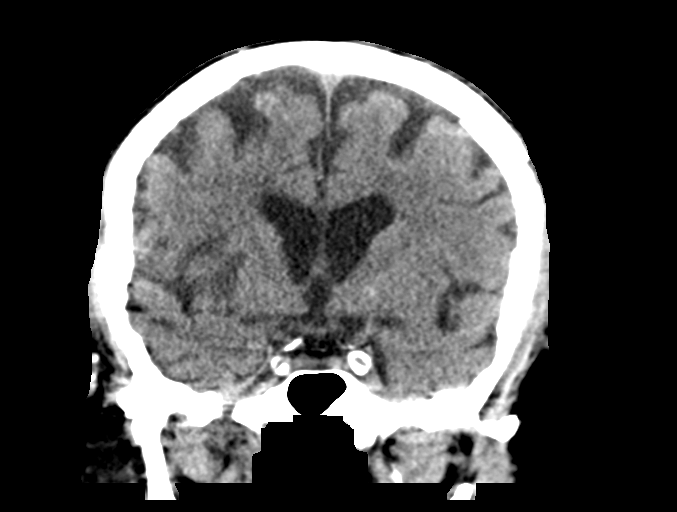

[Series 6: head 3.0 mpr sag · sagittal · 0.33mm/px · 3 of 65 slices shown]
[im 22/65  brain]
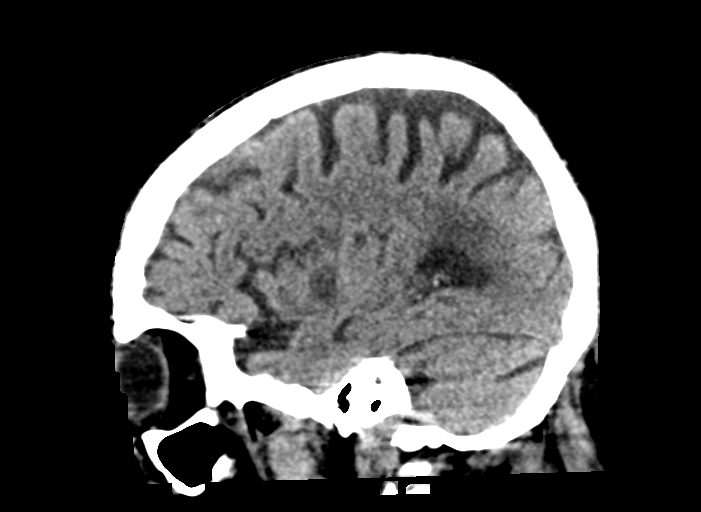
[im 33/65  brain]
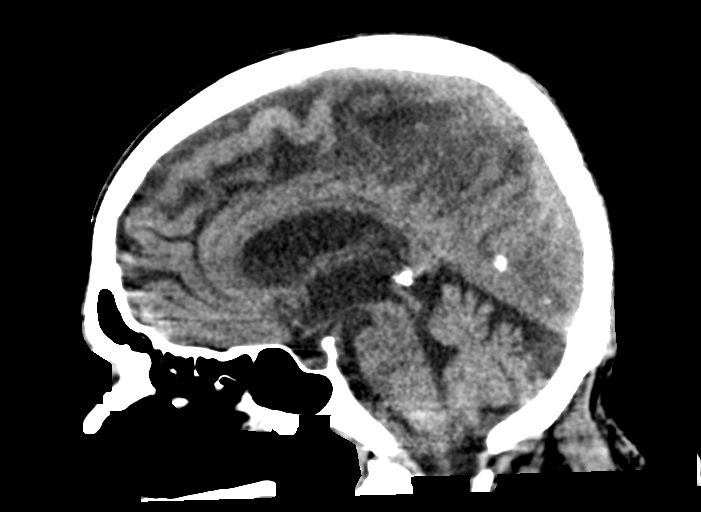
[im 43/65  brain]
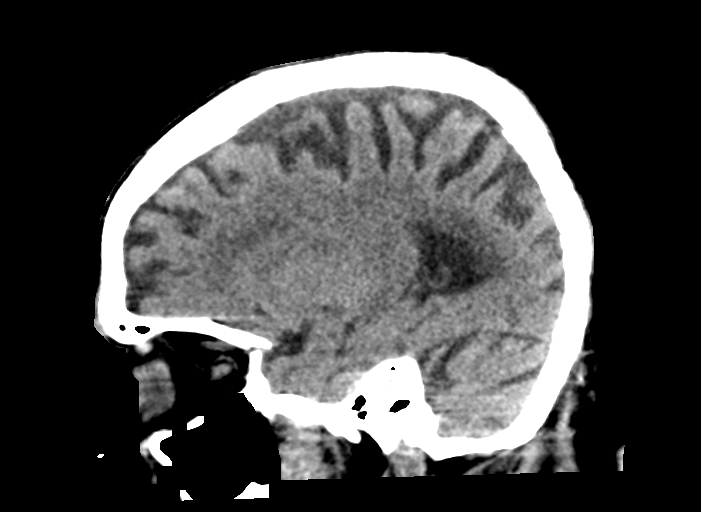

[14 of 47 positions shown; findings below may reference images not displayed]

FINDINGS: Brain: The brainstem and cerebellum are unremarkable. Chronic
small-vessel ischemic changes of the deep white matter appear
similar to the previous study. Low-density now evident in the deep
insular cortex on the right and underlying white matter tracks
consistent with acute infarction in that location shown by recent
MRI. More widespread right insular cortex shows some loss of
gray-white differentiation but no pronounced swelling. No evidence
of regional hemorrhage. No hydrocephalus or extra-axial collection

Vascular: There is atherosclerotic calcification of the major
vessels at the base of the brain.

Skull: Negative

Sinuses/Orbits: Clear/normal

Other: None
IMPRESSION: Low-density now evident in the deep insular cortex and underlying
white matter tracks on the right consistent with acute infarction in
that location shown by recent MRI. More widespread right insular
cortex shows some loss of gray-white differentiation but no
pronounced swelling. No evidence of regional hemorrhage.

## 2020-01-06 MED ORDER — SODIUM CHLORIDE 0.9 % IV BOLUS
500.0000 mL | Freq: Once | INTRAVENOUS | Status: AC
  Administered 2020-01-06: 500 mL via INTRAVENOUS

## 2020-01-06 MED ORDER — HYDRALAZINE HCL 50 MG PO TABS
50.0000 mg | ORAL_TABLET | Freq: Three times a day (TID) | ORAL | Status: DC
  Administered 2020-01-06 – 2020-01-07 (×5): 50 mg via ORAL
  Filled 2020-01-06 (×6): qty 1

## 2020-01-06 MED ORDER — INSULIN GLARGINE 100 UNIT/ML ~~LOC~~ SOLN
10.0000 [IU] | Freq: Every day | SUBCUTANEOUS | Status: DC
  Administered 2020-01-06: 10 [IU] via SUBCUTANEOUS
  Filled 2020-01-06 (×2): qty 0.1

## 2020-01-06 NOTE — Progress Notes (Signed)
Call for questions

## 2020-01-06 NOTE — Progress Notes (Signed)
STROKE TEAM PROGRESS NOTE   INTERVAL HISTORY Patient was sitting in the bedside chair during am rounds and did well initially when he got of bed for 1-2 hrs but when daughter arrived to take him home he developed increased dysarthria and left-sided weakness.  He was laid flat and his left-sided weakness improved but he still had mild dysarthria.  His IV had been taken out and subsequently it was reestablished and given 1 L fluid.  His blood pressure was running in the 829 systolic at this time.[name]  His daughter states that his blood pressure usually runs in the 562-130 systolic range at home.  His blood glucose elevated at 300 but he has only been getting sliding scale insulin and home medications for diabetes have not yet been resumed OBJECTIVE Vitals:   01/06/20 0800 01/06/20 0840 01/06/20 0900 01/06/20 1156  BP: (!) 166/91  (!) 165/50   Pulse: 69  64   Resp: 19  18   Temp: 97.8 F (36.6 C)   98.6 F (37 C)  TempSrc: Oral   Oral  SpO2: 98%  100%   Weight:  72.5 kg    Height:  5\' 7"  (1.702 m)     CBC:  Recent Labs  Lab 12/30/19 2315 12/30/19 2324 01/03/20 0147 01/04/20 0249  WBC 7.0   < > 10.1 9.3  NEUTROABS 4.9  --   --  6.9  HGB 8.7*   < > 8.5* 8.2*  HCT 27.9*   < > 26.5* 25.5*  MCV 85.3   < > 82.3 82.8  PLT 235   < > 273 255   < > = values in this interval not displayed.   Basic Metabolic Panel:  Recent Labs  Lab 01/04/20 0249 01/05/20 1145  NA 139 137  K 3.9 4.0  CL 109 109  CO2 20* 20*  GLUCOSE 166* 241*  BUN 40* 37*  CREATININE 1.86* 1.52*  CALCIUM 7.7* 7.8*   Lipid Panel:     Component Value Date/Time   CHOL 85 12/31/2019 0318   TRIG 152 (H) 12/31/2019 0318   HDL 44 12/31/2019 0318   CHOLHDL 1.9 12/31/2019 0318   VLDL 30 12/31/2019 0318   LDLCALC 11 12/31/2019 0318   HgbA1c:  Lab Results  Component Value Date   HGBA1C 6.0 (H) 12/31/2019   Urine Drug Screen:     Component Value Date/Time   LABOPIA NONE DETECTED 12/31/2019 1806   COCAINSCRNUR NONE  DETECTED 12/31/2019 1806   LABBENZ NONE DETECTED 12/31/2019 1806   AMPHETMU NONE DETECTED 12/31/2019 1806   THCU NONE DETECTED 12/31/2019 1806   LABBARB NONE DETECTED 12/31/2019 1806    Alcohol Level     Component Value Date/Time   ETH <10 12/30/2019 2315    IMAGING  CT Code Stroke CTA Head W/WO contrast CT Code Stroke CTA Neck W/WO contrast 12/31/2019 IMPRESSION:  1. Negative CTA for emergent large vessel occlusion.  2. Approximate 8-9 mm severe near occlusive stenosis of the proximal right M1 segment. Right MCA branches attenuated but patent distally with no visible downstream occlusion.  3. Additional severe proximal right A1 stenosis.  4. Atheromatous change about the carotid bifurcations with associated stenoses of up to 50% on the right and 45% on the left.  5. Atheromatous change about the right carotid siphon with associated moderate to severe multifocal stenoses.  6. Small amount of secretions within the proximal right mainstem bronchus, suggesting that this patient is at risk for aspiration.   MR BRAIN  WO CONTRAST 12/31/2019 IMPRESSION:  1. Patchy and hazy diffusion abnormality involving the right insular cortex, subinsular white matter, and overlying right frontal operculum, consistent with acute right MCA territory ischemia. No associated hemorrhage or mass effect. Finding is consistent with the previously identified severe right M1 stenosis.  2. No other acute intracranial abnormality.  3. Age-related cerebral atrophy with mild chronic small vessel ischemic disease.   CT HEAD CODE STROKE WO CONTRAST 12/30/2019 IMPRESSION:  1. No acute intracranial infarct or other abnormality.  2. ASPECTS is 10.  3. Age-appropriate cerebral atrophy with mild chronic small vessel ischemic disease.   CT HEAD WO CONTRAST - post tPA 01/01/20 IMPRESSION: 1. Patchy evolving small volume acute ischemic infarcts involving the right subinsular white matter, adjacent insular cortex, and  overlying operculum, corresponding with findings on prior MRI. No interval hemorrhage, mass effect, or other complication. 2. No other new acute intracranial abnormality.  DG Chest 2 View 12/30/2019 IMPRESSION:  1. Stable cardiomegaly. No pulmonary venous congestion.  2. Low lung volumes.   Transthoracic Echocardiogram  1. Left ventricular ejection fraction, by estimation, is 60 to 65%. The left ventricle has normal function. The left ventricle has no regional wall motion abnormalities. There is mild concentric left ventricular hypertrophy. Left ventricular diastolic parameters are consistent with Grade II diastolic dysfunction (pseudonormalization). Elevated left atrial pressure. The E/e' is 16.  2. Right ventricular systolic function is normal. The right ventricular size is normal. Tricuspid regurgitation signal is inadequate for assessing PA pressure.  3. Left atrial size was mildly dilated.  4. The mitral valve is degenerative. No evidence of mitral valve regurgitation. No evidence of mitral stenosis.  5. The aortic valve is tricuspid. Aortic valve regurgitation is not visualized. No aortic stenosis is present.  6. The inferior vena cava is normal in size with greater than 50% respiratory variability, suggesting right atrial pressure of 3 mmHg.  ECG - SR rate 70 BPM. (See cardiology reading for complete details)   PHYSICAL EXAM    General - Well nourished, well developed elderly male, in no apparent distress.  Ophthalmologic - fundi not visualized due to noncooperation.  Cardiovascular - Regular rhythm and rate.  Mental Status -  Level of arousal and orientation to time, place, and person were intact.  There is mild to moderate dysarthria noted. Language including expression, naming, repetition, comprehension was assessed and found intact.  Cranial Nerves II - XII - II - Visual field intact OU. III, IV, VI - Extraocular movements intact. V - Facial sensation intact  bilaterally. VII - mild left facial droop. VIII - Hearing & vestibular intact bilaterally. X - Palate elevates symmetrically. XI - Chin turning & shoulder shrug intact bilaterally. XII - Tongue protrusion intact.  Motor Strength - The patient's strength was normal in all extremities except mild left UE pronator drift.  There is clawhand deformity of the hands bilaterally more profound on the right side Likely from prior ulnar nerve damage at the elbow.  There is marked global atrophy of the intrinsic hand muscles on the right and mild on the left.  These are associated with weakness of the hand muscles bilaterally. Motor Tone - Muscle tone was assessed at the neck and was normal.  Reflexes - The patient's reflexes were symmetrical in all extremities and he had no pathological reflexes.  Sensory - Light touch, temperature/pinprick were assessed and were symmetrical.    Coordination - The patient had normal movements in the hands with no ataxia or dysmetria on right  side. Mild left sided ataxia.   Gait and Station - deferred.   ASSESSMENT/PLAN [name] is a 86 y.o. male with history of coronary artery disease, CKD stage III, dementia, diabetes, diastolic dysfunction, peripheral vascular disease, prostate cancer, history of multiple syncopal episodes, presented to the emergency room for evaluation of sudden onset of slurred speech, difficulty swallowing and left-sided weakness. He was scheduled for a GI scoping today, but could not go ahead with the procedure because of severely elevated blood pressures with systolic in the 353I.  His Plavix was on hold for procedure.  The patient received IV t-PA Friday 12/31/19 at [place] [facility].  Stroke: acute right MCA pachy infarcts with right M1 occlusion s/p tPA - cardioembolic vs. Large vessel disease source  CT Head - No acute intracranial infarct or other abnormality. ASPECTS is 10.   CTA H&N - Approximate 8-9 mm severe near occlusive stenosis of  the proximal right M1 segment. Right MCA branches attenuated but patent distally with no visible downstream occlusion. Additional severe proximal right A1 stenosis. ICA stenosis up to 50% on the right and 45% on the left. Atheromatous change about the right carotid siphon with associated moderate to severe multifocal stenoses.  MRI head - Patchy and hazy diffusion abnormality involving the right insular cortex, subinsular white matter, and overlying right frontal operculum, consistent with acute right MCA territory ischemia. No associated hemorrhage or mass effect. Finding is consistent with the previously identified severe right M1 stenosis.   CT head repeat 24h post tPA - Patchy evolving small volume acute ischemic infarcts involving the right subinsular white matter, adjacent insular cortex, and overlying operculum, corresponding with findings on prior MRI. No interval hemorrhage  2D Echo - EF 60 - 65%. No cardiac source of emboli identified.   Consider 30 day cardiac event monitoring as outpt to rule out afib  [employer] Virus 2 - negative  LDL - 11  HgbA1c - 6.0  UDS neg  VTE prophylaxis - SCDs  aspirin 81 mg daily and clopidogrel 75 mg daily (Plavix held 5 days for colonoscopy) prior to admission, Aspirin 81 mg daily and Plavix 75 mg daily restarted 01/01/20, changed plavix to brillinta. Continue aspirin 81mg  and brillinta for 3 months, then discontinue aspirin and continue only with brillinta.    Therapy recommendations:  HH PT, [facility] OT  W/ 24/7 care  Disposition:  Pending  Plan transfer to floor vs d/c home depending on post hospital care plans  CAD s/p stent Elevated troponin  Troponin 35->50->215>209  EKG no ST-T change  TTE - EF 60 - 65%. No cardiac source of emboli identified.   Cardiology on board  On DAPT PTA  CT head repeat 24h post tPA 11/20 2:25 - Patchy evolving small volume acute ischemic infarcts involving the right subinsular white matter, adjacent insular  cortex, and overlying operculum, corresponding with findings on prior MRI. No interval hemorrhage  On DAPT as above  Hypertensive Urgency Bradycardia on BB  Home BP meds: Cozaar ; amlodipine ; HCTZ ; Coreg ; hydralazine ; Cardura  BP remains up this am - po meds again increased  Now off cleviprex   Avoid labetalol if HR down, discussed with RN . Long-term BP goal normotensive  Hyperlipidemia  Home Lipid lowering medication: Lipitor 80 mg daily  LDL 11, goal < 70  Current lipid lowering medication: decrease to lipitor 40   Continue statin at discharge  Diabetes  Home diabetic meds: insulin  Current diabetic meds: SSI  HgbA1c 6.0, goal < 7.0  Controlled  CBG monitoring  Follow up with PCP  Other Stroke Risk Factors  Advanced age  Former cigarette smoker - quit  ETOH use, advised to drink no more than 1 alcoholic beverage per day.  Hx stroke/TIA  Coronary artery disease  Dysphagia   Speech on board  Passed MBS  On dys 3 and thin  Gentle hydration  Other Active Problems  Code status - DNR   Dementia on Aricept and nemanda - continue   Hx of multiple syncopal episode  CKD - stage 3a - Creatinine - 1.30->1.47->1.69->2.30->1.86  necrotic R foot 2nd toe w/o planned amputation per podiatry, [facility] 12/2019. dsg changes daily  Anemia of chronic disease - Hgb - 8.2  Liquid stool reported - C-diff quick screen ordered 01/01/20 - negatvie - stopped enteric isolation  Hypocalcemia - Calcium - 7.7  Hospital day # 6  Plan Will cancel discharge home today.  Keep flat in bed and give 1 L normal saline bolus.  If symptoms do not resolve we will consider repeating CT scan of the head.  Patient seems to be sensitive to blood pressure drops in the 373 systolic range and likely lives with a higher systolic blood pressure to perfuse his brain.  Recommend discontinue clonidine and reduce the dose of hydralazine back to his home dose of 50 mg 3 times daily.  Long  discussion with patient's daughter at the bedside as well as with another daughter via Carolee Rota and answered questions.  Greater than 50% time during this 35-minute visit was spent on counseling and coordination of care about his blood pressure management and stroke and answering questions Antony Contras, MD To contact Stroke Continuity provider, please refer to [email or web address] After hours, contact General Neurology

## 2020-01-07 ENCOUNTER — Other Ambulatory Visit: Payer: Self-pay | Admitting: Physician Assistant

## 2020-01-07 DIAGNOSIS — I639 Cerebral infarction, unspecified: Secondary | ICD-10-CM | POA: Diagnosis not present

## 2020-01-07 LAB — GLUCOSE, CAPILLARY
Glucose-Capillary: 134 mg/dL — ABNORMAL HIGH (ref 70–99)
Glucose-Capillary: 180 mg/dL — ABNORMAL HIGH (ref 70–99)
Glucose-Capillary: 141 mg/dL — ABNORMAL HIGH (ref 70–99)
Glucose-Capillary: 178 mg/dL — ABNORMAL HIGH (ref 70–99)

## 2020-01-07 MED ORDER — INSULIN GLARGINE 100 UNIT/ML ~~LOC~~ SOLN
20.0000 [IU] | Freq: Every day | SUBCUTANEOUS | Status: DC
  Administered 2020-01-07 – 2020-01-08 (×2): 20 [IU] via SUBCUTANEOUS
  Filled 2020-01-07 (×4): qty 0.2

## 2020-01-07 NOTE — Progress Notes (Signed)
STROKE TEAM PROGRESS NOTE   INTERVAL HISTORY Patient was sitting in the bedside chair during am rounds and and is doing well.  Speech is back to normal and left-sided deficits are quite minimum.  He wants to go home.  However when he was evaluated by physical therapist and family was on the way to take him home and noticed that he required maximum assist and could barely walk 5 feet and they have changed recommendations now from home health to inpatient rehab.   OBJECTIVE Vitals:   01/06/20 2357 01/07/20 0345 01/07/20 0700 01/07/20 1218  BP: (!) 167/51 (!) 178/54 (!) 179/56 (!) 140/46  Pulse:    (!) 51  Resp: (!) 21 15 15 16   Temp: 98.7 F (37.1 C) 98.9 F (37.2 C) 98 F (36.7 C) 98 F (36.7 C)  TempSrc: Oral Oral Oral Oral  SpO2: 97% 96% 98% 100%  Weight:      Height:       CBC:  Recent Labs  Lab 01/03/20 0147 01/04/20 0249  WBC 10.1 9.3  NEUTROABS  --  6.9  HGB 8.5* 8.2*  HCT 26.5* 25.5*  MCV 82.3 82.8  PLT 273 810   Basic Metabolic Panel:  Recent Labs  Lab 01/04/20 0249 01/05/20 1145  NA 139 137  K 3.9 4.0  CL 109 109  CO2 20* 20*  GLUCOSE 166* 241*  BUN 40* 37*  CREATININE 1.86* 1.52*  CALCIUM 7.7* 7.8*   Lipid Panel:     Component Value Date/Time   CHOL 85 12/31/2019 0318   TRIG 152 (H) 12/31/2019 0318   HDL 44 12/31/2019 0318   CHOLHDL 1.9 12/31/2019 0318   VLDL 30 12/31/2019 0318   LDLCALC 11 12/31/2019 0318   HgbA1c:  Lab Results  Component Value Date   HGBA1C 6.0 (H) 12/31/2019   Urine Drug Screen:     Component Value Date/Time   LABOPIA NONE DETECTED 12/31/2019 1806   COCAINSCRNUR NONE DETECTED 12/31/2019 1806   LABBENZ NONE DETECTED 12/31/2019 1806   AMPHETMU NONE DETECTED 12/31/2019 1806   THCU NONE DETECTED 12/31/2019 1806   LABBARB NONE DETECTED 12/31/2019 1806    Alcohol Level     Component Value Date/Time   ETH <10 12/30/2019 2315    IMAGING  CT Code Stroke CTA Head W/WO contrast CT Code Stroke CTA Neck W/WO  contrast 12/31/2019 IMPRESSION:  1. Negative CTA for emergent large vessel occlusion.  2. Approximate 8-9 mm severe near occlusive stenosis of the proximal right M1 segment. Right MCA branches attenuated but patent distally with no visible downstream occlusion.  3. Additional severe proximal right A1 stenosis.  4. Atheromatous change about the carotid bifurcations with associated stenoses of up to 50% on the right and 45% on the left.  5. Atheromatous change about the right carotid siphon with associated moderate to severe multifocal stenoses.  6. Small amount of secretions within the proximal right mainstem bronchus, suggesting that this patient is at risk for aspiration.   MR BRAIN WO CONTRAST 12/31/2019 IMPRESSION:  1. Patchy and hazy diffusion abnormality involving the right insular cortex, subinsular white matter, and overlying right frontal operculum, consistent with acute right MCA territory ischemia. No associated hemorrhage or mass effect. Finding is consistent with the previously identified severe right M1 stenosis.  2. No other acute intracranial abnormality.  3. Age-related cerebral atrophy with mild chronic small vessel ischemic disease.   CT HEAD CODE STROKE WO CONTRAST 12/30/2019 IMPRESSION:  1. No acute intracranial infarct or other  abnormality.  2. ASPECTS is 10.  3. Age-appropriate cerebral atrophy with mild chronic small vessel ischemic disease.   CT HEAD WO CONTRAST - post tPA 01/01/20 IMPRESSION: 1. Patchy evolving small volume acute ischemic infarcts involving the right subinsular white matter, adjacent insular cortex, and overlying operculum, corresponding with findings on prior MRI. No interval hemorrhage, mass effect, or other complication. 2. No other new acute intracranial abnormality.  DG Chest 2 View 12/30/2019 IMPRESSION:  1. Stable cardiomegaly. No pulmonary venous congestion.  2. Low lung volumes.   Transthoracic Echocardiogram  1. Left  ventricular ejection fraction, by estimation, is 60 to 65%. The left ventricle has normal function. The left ventricle has no regional wall motion abnormalities. There is mild concentric left ventricular hypertrophy. Left ventricular diastolic parameters are consistent with Grade II diastolic dysfunction (pseudonormalization). Elevated left atrial pressure. The E/e' is 33.  2. Right ventricular systolic function is normal. The right ventricular size is normal. Tricuspid regurgitation signal is inadequate for assessing PA pressure.  3. Left atrial size was mildly dilated.  4. The mitral valve is degenerative. No evidence of mitral valve regurgitation. No evidence of mitral stenosis.  5. The aortic valve is tricuspid. Aortic valve regurgitation is not visualized. No aortic stenosis is present.  6. The inferior vena cava is normal in size with greater than 50% respiratory variability, suggesting right atrial pressure of 3 mmHg.  ECG - SR rate 70 BPM. (See cardiology reading for complete details)   PHYSICAL EXAM    General - Well nourished, well developed elderly male, in no apparent distress.  Ophthalmologic - fundi not visualized due to noncooperation.  Cardiovascular - Regular rhythm and rate.  Mental Status -  Level of arousal and orientation to time, place, and person were intact.  There is minimal dysarthria noted. Language including expression, naming, repetition, comprehension was assessed and found intact.  Cranial Nerves II - XII - II - Visual field intact OU. III, IV, VI - Extraocular movements intact. V - Facial sensation intact bilaterally. VII - mild left facial droop. VIII - Hearing & vestibular intact bilaterally. X - Palate elevates symmetrically. XI - Chin turning & shoulder shrug intact bilaterally. XII - Tongue protrusion intact.  Motor Strength - The patient's strength was normal in all extremities except mild left UE pronator drift.  There is clawhand deformity  of the hands bilaterally more profound on the right side Likely from prior ulnar nerve damage at the elbow.  There is marked global atrophy of the intrinsic hand muscles on the right and mild on the left.  These are associated with weakness of the hand muscles bilaterally. Motor Tone - Muscle tone was assessed at the neck and was normal.  Reflexes - The patient's reflexes were symmetrical in all extremities and he had no pathological reflexes.  Sensory - Light touch, temperature/pinprick were assessed and were symmetrical.    Coordination - The patient had normal movements in the hands with no ataxia or dysmetria on right side. Mild left sided ataxia.   Gait and Station - deferred.   ASSESSMENT/PLAN [name] is a 86 y.o. male with history of coronary artery disease, CKD stage III, dementia, diabetes, diastolic dysfunction, peripheral vascular disease, prostate cancer, history of multiple syncopal episodes, presented to the emergency room for evaluation of sudden onset of slurred speech, difficulty swallowing and left-sided weakness. He was scheduled for a GI scoping today, but could not go ahead with the procedure because of severely elevated blood pressures with  systolic in the 818E.  His Plavix was on hold for procedure.  The patient received IV t-PA Friday 12/31/19 at [place].  Stroke: acute right MCA pachy infarcts with right M1 occlusion s/p tPA - cardioembolic vs. Large vessel disease source  CT Head - No acute intracranial infarct or other abnormality. ASPECTS is 10.   CTA H&N - Approximate 8-9 mm severe near occlusive stenosis of the proximal right M1 segment. Right MCA branches attenuated but patent distally with no visible downstream occlusion. Additional severe proximal right A1 stenosis. ICA stenosis up to 50% on the right and 45% on the left. Atheromatous change about the right carotid siphon with associated moderate to severe multifocal stenoses.  MRI head - Patchy and  hazy diffusion abnormality involving the right insular cortex, subinsular white matter, and overlying right frontal operculum, consistent with acute right MCA territory ischemia. No associated hemorrhage or mass effect. Finding is consistent with the previously identified severe right M1 stenosis.   CT head repeat 24h post tPA - Patchy evolving small volume acute ischemic infarcts involving the right subinsular white matter, adjacent insular cortex, and overlying operculum, corresponding with findings on prior MRI. No interval hemorrhage  2D Echo - EF 60 - 65%. No cardiac source of emboli identified.   Consider 30 day cardiac event monitoring as outpt to rule out afib  [employer] Virus 2 - negative  LDL - 11  HgbA1c - 6.0  UDS neg  VTE prophylaxis - SCDs  aspirin 81 mg daily and clopidogrel 75 mg daily (Plavix held 5 days for colonoscopy) prior to admission, Aspirin 81 mg daily and Plavix 75 mg daily restarted 01/01/20, changed plavix to brillinta. Continue aspirin 81mg  and brillinta for 3 months, then discontinue aspirin and continue only with brillinta.    Therapy recommendations:  HH PT, [facility] OT  W/ 24/7 care  Disposition:  Pending  Plan transfer to floor vs d/c home depending on post hospital care plans  CAD s/p stent Elevated troponin  Troponin 35->50->215>209  EKG no ST-T change  TTE - EF 60 - 65%. No cardiac source of emboli identified.   Cardiology on board  On DAPT PTA  CT head repeat 24h post tPA 11/20 2:25 - Patchy evolving small volume acute ischemic infarcts involving the right subinsular white matter, adjacent insular cortex, and overlying operculum, corresponding with findings on prior MRI. No interval hemorrhage  On DAPT as above  Hypertensive Urgency Bradycardia on BB  Home BP meds: Cozaar ; amlodipine ; HCTZ ; Coreg ; hydralazine ; Cardura  BP remains up this am - po meds again increased  Now off cleviprex   Avoid labetalol if HR down, discussed  with RN . Long-term BP goal normotensive  Hyperlipidemia  Home Lipid lowering medication: Lipitor 80 mg daily  LDL 11, goal < 70  Current lipid lowering medication: decrease to lipitor 40   Continue statin at discharge  Diabetes  Home diabetic meds: insulin  Current diabetic meds: SSI   HgbA1c 6.0, goal < 7.0  Controlled  CBG monitoring  Follow up with PCP  Other Stroke Risk Factors  Advanced age  Former cigarette smoker - quit  ETOH use, advised to drink no more than 1 alcoholic beverage per day.  Hx stroke/TIA  Coronary artery disease  Dysphagia   Speech on board  Passed MBS  On dys 3 and thin  Gentle hydration  Other Active Problems  Code status - DNR   Dementia on Aricept and nemanda - continue  Hx of multiple syncopal episode  CKD - stage 3a - Creatinine - 1.30->1.47->1.69->2.30->1.86  necrotic R foot 2nd toe w/o planned amputation per podiatry, [facility] 12/2019. dsg changes daily  Anemia of chronic disease - Hgb - 8.2  Liquid stool reported - C-diff quick screen ordered 01/01/20 - negatvie - stopped enteric isolation  Hypocalcemia - Calcium - 7.7  Hospital day # 7  Plan Will cancel discharge home today.  Has patient's physical therapy needs have changed and they now recommend inpatient rehab.  I spoke to the patient's daughter earlier today prior to the physical therapy assessment and had suggested discharge home but now we will need to cancel that plan.  Discussed with physical therapist who spoke to the patient's daughter and updated her about change in plans and family was okay with decision greater than 50% time during this 25-minute visit was spent on counseling and coordination of care about his blood pressure management and stroke and answering questions Antony Contras, MD To contact Stroke Continuity provider, please refer to [email or web address] After hours, contact General Neurology

## 2020-01-07 NOTE — Plan of Care (Signed)
Problem: Education: Goal: Knowledge of secondary prevention will improve Outcome: Adequate for Discharge Goal: Knowledge of patient specific risk factors addressed and post discharge goals established will improve Outcome: Adequate for Discharge Goal: Individualized Educational Video(s) Outcome: Adequate for Discharge   Problem: Coping: Goal: Will identify appropriate support needs Outcome: Adequate for Discharge   Problem: Self-Care: Goal: Verbalization of feelings and concerns over difficulty with self-care will improve Outcome: Adequate for Discharge Goal: Ability to communicate needs accurately will improve Outcome: Adequate for Discharge   Problem: Nutrition: Goal: Dietary intake will improve Outcome: Adequate for Discharge   Problem: Education: Goal: Knowledge of disease or condition will improve Outcome: Adequate for Discharge Goal: Knowledge of secondary prevention will improve Outcome: Adequate for Discharge Goal: Knowledge of patient specific risk factors addressed and post discharge goals established will improve Outcome: Adequate for Discharge Goal: Individualized Educational Video(s) Outcome: Adequate for Discharge   Problem: Coping: Goal: Will identify appropriate support needs Outcome: Adequate for Discharge   Problem: Health Behavior/Discharge Planning: Goal: Ability to manage health-related needs will improve Outcome: Adequate for Discharge   Problem: Self-Care: Goal: Ability to participate in self-care as condition permits will improve Outcome: Adequate for Discharge Goal: Verbalization of feelings and concerns over difficulty with self-care will improve Outcome: Adequate for Discharge Goal: Ability to communicate needs accurately will improve Outcome: Adequate for Discharge   Problem: Nutrition: Goal: Risk of aspiration will decrease Outcome: Adequate for Discharge Goal: Dietary intake will improve Outcome: Adequate for Discharge   Problem:  Ischemic Stroke/TIA Tissue Perfusion: Goal: Complications of ischemic stroke/TIA will be minimized Outcome: Adequate for Discharge   Problem: Education: Goal: Knowledge of General Education information will improve Description: Including pain rating scale, medication(s)/side effects and non-pharmacologic comfort measures Outcome: Adequate for Discharge   Problem: Health Behavior/Discharge Planning: Goal: Ability to manage health-related needs will improve Outcome: Adequate for Discharge   Problem: Clinical Measurements: Goal: Ability to maintain clinical measurements within normal limits will improve Outcome: Adequate for Discharge Goal: Will remain free from infection Outcome: Adequate for Discharge Goal: Diagnostic test results will improve Outcome: Adequate for Discharge Goal: Respiratory complications will improve Outcome: Adequate for Discharge Goal: Cardiovascular complication will be avoided Outcome: Adequate for Discharge   Problem: Activity: Goal: Risk for activity intolerance will decrease Outcome: Adequate for Discharge   Problem: Nutrition: Goal: Adequate nutrition will be maintained Outcome: Adequate for Discharge   Problem: Coping: Goal: Level of anxiety will decrease Outcome: Adequate for Discharge   Problem: Elimination: Goal: Will not experience complications related to bowel motility Outcome: Adequate for Discharge Goal: Will not experience complications related to urinary retention Outcome: Adequate for Discharge   Problem: Pain Managment: Goal: General experience of comfort will improve Outcome: Adequate for Discharge   Problem: Safety: Goal: Ability to remain free from injury will improve Outcome: Adequate for Discharge   Problem: Skin Integrity: Goal: Risk for impaired skin integrity will decrease Outcome: Adequate for Discharge   Problem: Education: Goal: Knowledge of General Education information will improve Description: Including pain  rating scale, medication(s)/side effects and non-pharmacologic comfort measures Outcome: Adequate for Discharge   Problem: Health Behavior/Discharge Planning: Goal: Ability to manage health-related needs will improve Outcome: Adequate for Discharge   Problem: Clinical Measurements: Goal: Ability to maintain clinical measurements within normal limits will improve Outcome: Adequate for Discharge Goal: Will remain free from infection Outcome: Adequate for Discharge Goal: Diagnostic test results will improve Outcome: Adequate for Discharge Goal: Respiratory complications will improve Outcome: Adequate for Discharge Goal: Cardiovascular complication  will be avoided Outcome: Adequate for Discharge   Problem: Activity: Goal: Risk for activity intolerance will decrease Outcome: Adequate for Discharge   Problem: Nutrition: Goal: Adequate nutrition will be maintained Outcome: Adequate for Discharge   Problem: Coping: Goal: Level of anxiety will decrease Outcome: Adequate for Discharge   Problem: Elimination: Goal: Will not experience complications related to bowel motility Outcome: Adequate for Discharge Goal: Will not experience complications related to urinary retention Outcome: Adequate for Discharge   Problem: Pain Managment: Goal: General experience of comfort will improve Outcome: Adequate for Discharge   Problem: Safety: Goal: Ability to remain free from injury will improve Outcome: Adequate for Discharge   Problem: Skin Integrity: Goal: Risk for impaired skin integrity will decrease Outcome: Adequate for Discharge

## 2020-01-07 NOTE — Progress Notes (Signed)
Physical Therapy Treatment Patient Details Name: Peter [name] MRN: [id number] DOB: [birth year] Today's Date: 01/07/2020    History of Present Illness Pt is an 86 yo male presenting with acute onset of fluctuating slurred speech, L sided weakness, and difficulty swallowing s/p tPA. MRI concerning for R MCA territory ischemia. CTA revealed a small amount of secretions within the proximal R mainstem bronchus, suggestive of aspiration risk. PMH includes: prostate ca, PAD, MI, mild dementia, HTN, HLD, gout, GERD, DM, CAD, CKD     PT Comments    Pt with significant regression this session, now requiring much greater physical assistance to stand and unable to safely ambulate more than a few feet at this time. Pt demonstrates left inattention and is generally weak in both LEs. This leads to significant foot drag during ambulation attempts and multiple losses of balance requiring assistance to prevent falls. Pt also has poor awareness of his current deficits and their implication on his mobility, wanting to walk around the unit despite not being able to clear each foot to step. Pt will benefit from continued acute PT POC as well and aggressive mobilization to improve mobility quality and reduce falls risk. PT updates recommendations to CIR due to new assistance requirements and increased falls risk.   Follow Up Recommendations  CIR     Equipment Recommendations  Wheelchair (measurements PT);Wheelchair cushion (measurements PT);Other (comment) (mechanical lift, all if home today)    Recommendations for Other Services Rehab consult     Precautions / Restrictions Precautions Precautions: Fall Restrictions Weight Bearing Restrictions: No    Mobility  Bed Mobility               General bed mobility comments: pt received and left in recliner  Transfers Overall transfer level: Needs assistance Equipment used: Rolling walker (2 wheeled);1 person hand held assist Transfers: Sit to/from  Stand Sit to Stand: Mod assist;Max assist         General transfer comment: mod-maxA, LE weaknes resulting in impaired ability to extend knees and hips into standing. PT provides cues for anterior trunk lean, rocking, and foot placement.  Ambulation/Gait Ambulation/Gait assistance: Max assist Gait Distance (Feet): 5 Feet Assistive device: Rolling walker (2 wheeled) Gait Pattern/deviations: Shuffle Gait velocity: reduced Gait velocity interpretation: <1.31 ft/sec, indicative of household ambulator General Gait Details: pt with short shuffling gait, inattention to LLE resulting in significant foot drag and LLE left far behind pt. Requires maxA to prevent fall due to loss of balance toward R side   Stairs             Wheelchair Mobility    Modified Rankin (Stroke Patients Only) Modified Rankin (Stroke Patients Only) Pre-Morbid Rankin Score: No significant disability Modified Rankin: Moderately severe disability     Balance Overall balance assessment: Needs assistance Sitting-balance support: Single extremity supported;Bilateral upper extremity supported;Feet supported Sitting balance-Leahy Scale: Poor Sitting balance - Comments: reliant on UE support due to posterior lean Postural control: Posterior lean Standing balance support: Bilateral upper extremity supported Standing balance-Leahy Scale: Zero Standing balance comment: mod-maxA due to posterior and left lateral lean                            Cognition Arousal/Alertness: Awake/alert Behavior During Therapy: WFL for tasks assessed/performed Overall Cognitive Status: History of cognitive impairments - at baseline  General Comments: pt with significant impairments in awareness, does recognize he has weakness and has difficulty mobilizing but is unable to understand how he cannot walk out of the room despite being unable to clear each foot to step       Exercises      General Comments General comments (skin integrity, edema, etc.): VSS on RA, pt assisted in oral hygiene tasks      Pertinent Vitals/Pain Pain Assessment: Faces Faces Pain Scale: Hurts little more Pain Location: R toe Pain Descriptors / Indicators: Discomfort Pain Intervention(s): Monitored during session    Home Living                      Prior Function            PT Goals (current goals can now be found in the care plan section) Acute Rehab PT Goals Patient Stated Goal: get better, go home Progress towards PT goals: Not progressing toward goals - comment    Frequency    Min 4X/week      PT Plan Discharge plan needs to be updated    Co-evaluation              AM-PAC PT "6 Clicks" Mobility   Outcome Measure  Help needed turning from your back to your side while in a flat bed without using bedrails?: None Help needed moving from lying on your back to sitting on the side of a flat bed without using bedrails?: A Lot Help needed moving to and from a bed to a chair (including a wheelchair)?: Total Help needed standing up from a chair using your arms (e.g., wheelchair or bedside chair)?: A Lot Help needed to walk in hospital room?: Total Help needed climbing 3-5 steps with a railing? : Total 6 Click Score: 11    End of Session   Activity Tolerance: Patient tolerated treatment well Patient left: in chair;with call bell/phone within reach;with chair alarm set Nurse Communication: Mobility status PT Visit Diagnosis: Other abnormalities of gait and mobility (R26.89)     Time: 4709-6283 PT Time Calculation (min) (ACUTE ONLY): 35 min  Charges:  $Therapeutic Activity: 23-37 mins                     Zenaida Niece, PT, DPT Acute Rehabilitation Pager: [phone]    Zenaida Niece 01/07/2020, 12:05 PM

## 2020-01-07 NOTE — Progress Notes (Signed)
Progress Note  Patient Name: Peter [name] Date of Encounter: 01/07/2020  Primary Cardiologist: [name], MD   Subjective   Noted yesterday to have increased dysarthria and required IV fluid.  BPs in the 656C systolic clonidine discontinued and hydralazine reduced.    Today he is back to his baseline.  Denies pain except his right foot.    Inpatient Medications    Scheduled Meds: . allopurinol  100 mg Oral Daily  . amLODipine  10 mg Oral Daily  . aspirin EC  81 mg Oral Daily  . atorvastatin  40 mg Oral Daily  . carvedilol  6.25 mg Oral BID WC  . Chlorhexidine Gluconate Cloth  6 each Topical Daily  . docusate sodium  100 mg Oral Daily  . donepezil  10 mg Oral Daily  . doxazosin  4 mg Oral Daily  . DULoxetine  30 mg Oral Daily  . ferrous sulfate  325 mg Oral Daily  . finasteride  5 mg Oral Daily  . gabapentin  300 mg Oral BID  . hydrALAZINE  50 mg Oral TID  . insulin aspart  0-15 Units Subcutaneous TID WC  . insulin aspart  0-5 Units Subcutaneous QHS  . insulin glargine  20 Units Subcutaneous QHS  . isosorbide mononitrate  60 mg Oral Daily  . memantine  10 mg Oral BID  . multivitamin with minerals  1 tablet Oral Daily  . ticagrelor  90 mg Oral BID   Continuous Infusions: . sodium chloride    . sodium chloride Stopped (01/06/20 0900)   PRN Meds: acetaminophen **OR** acetaminophen (TYLENOL) oral liquid 160 mg/5 mL **OR** acetaminophen, labetalol, senna-docusate   Vital Signs    Vitals:   01/06/20 2000 01/06/20 2357 01/07/20 0345 01/07/20 0700  BP:  (!) 167/51 (!) 178/54 (!) 179/56  Pulse:      Resp:  (!) 21 15 15   Temp: (!) 97.3 F (36.3 C) 98.7 F (37.1 C) 98.9 F (37.2 C) 98 F (36.7 C)  TempSrc: Oral Oral Oral Oral  SpO2:  97% 96% 98%  Weight:      Height:        Intake/Output Summary (Last 24 hours) at 01/07/2020 0937 Last data filed at 01/06/2020 1700 Gross per 24 hour  Intake 613.11 ml  Output --  Net 613.11 ml   Filed Weights    01/06/20 0840  Weight: 72.5 kg    Physical Exam   GEN: No  acute distress.   Neck: No  JVD Cardiac: RRR, no murmurs, rubs, or gallops.  Respiratory: Clear   to auscultation bilaterally. GI: Soft, nontender, non-distended, normal bowel sounds  MS:  No edema; No deformity. Neuro:   Nonfocal  Psych: Oriented and appropriate     Labs    Chemistry Recent Labs  Lab 01/03/20 0147 01/04/20 0249 01/05/20 1145  NA 138 139 137  K 4.1 3.9 4.0  CL 108 109 109  CO2 23 20* 20*  GLUCOSE 203* 166* 241*  BUN 32* 40* 37*  CREATININE 2.30* 1.86* 1.52*  CALCIUM 7.7* 7.7* 7.8*  GFRNONAA 27* 35* 44*  ANIONGAP 7 10 8      Hematology Recent Labs  Lab 01/02/20 0408 01/03/20 0147 01/04/20 0249  WBC 8.7 10.1 9.3  RBC 3.26* 3.22* 3.08*  HGB 8.7* 8.5* 8.2*  HCT 27.2* 26.5* 25.5*  MCV 83.4 82.3 82.8  MCH 26.7 26.4 26.6  MCHC 32.0 32.1 32.2  RDW 17.3* 17.7* 17.9*  PLT 269 273 255  Cardiac EnzymesNo results for input(s): TROPONINI in the last 168 hours. No results for input(s): TROPIPOC in the last 168 hours.   BNPNo results for input(s): BNP, PROBNP in the last 168 hours.   DDimer No results for input(s): DDIMER in the last 168 hours.   Radiology    CT HEAD WO CONTRAST  Result Date: 01/06/2020 CLINICAL DATA:  Follow-up stroke EXAM: CT HEAD WITHOUT CONTRAST TECHNIQUE: Contiguous axial images were obtained from the base of the skull through the vertex without intravenous contrast. COMPARISON:  01/01/2020 CT.  12/31/2019 MRI. FINDINGS: Brain: The brainstem and cerebellum are unremarkable. Chronic small-vessel ischemic changes of the deep white matter appear similar to the previous study. Low-density now evident in the deep insular cortex on the right and underlying white matter tracks consistent with acute infarction in that location shown by recent MRI. More widespread right insular cortex shows some loss of gray-white differentiation but no pronounced swelling. No evidence of  regional hemorrhage. No hydrocephalus or extra-axial collection Vascular: There is atherosclerotic calcification of the major vessels at the base of the brain. Skull: Negative Sinuses/Orbits: Clear/normal Other: None IMPRESSION: Low-density now evident in the deep insular cortex and underlying white matter tracks on the right consistent with acute infarction in that location shown by recent MRI. More widespread right insular cortex shows some loss of gray-white differentiation but no pronounced swelling. No evidence of regional hemorrhage. Electronically Signed   By: Nelson Chimes M.D.   On: 01/06/2020 14:47    Telemetry    NSR, SB - Personally Reviewed  ECG    No new tracing as of 01/05/20- Personally Reviewed  Cardiac Studies   Echo 12/31/19: 1. Left ventricular ejection fraction, by estimation, is 60 to 65%. The  left ventricle has normal function. The left ventricle has no regional  wall motion abnormalities. There is mild concentric left ventricular  hypertrophy. Left ventricular diastolic  parameters are consistent with Grade II diastolic dysfunction  (pseudonormalization). Elevated left atrial pressure. The E/e' is 75.  2. Right ventricular systolic function is normal. The right ventricular  size is normal. Tricuspid regurgitation signal is inadequate for assessing  PA pressure.  3. Left atrial size was mildly dilated.  4. The mitral valve is degenerative. No evidence of mitral valve  regurgitation. No evidence of mitral stenosis.  5. The aortic valve is tricuspid. Aortic valve regurgitation is not  visualized. No aortic stenosis is present.  6. The inferior vena cava is normal in size with greater than 50%  respiratory variability, suggesting right atrial pressure of 3 mmHg.    Coronary stent intervention 07/05/19: Conclusions: 1. Severely diseased proximal and mid LAD with multifocal stenoses of up to 80-90% and heavy calcification. 2. Successful IVUS-guided orbital  atherectomy and PCI of the ostial through mid LAD using a Synergy 3.0 x 48 mm drug-eluting stent (postdilated up to 3.9 mm) with less than 10% residual stenosis and TIMI-3 flow.  Recommendations: 1. Continue dual antiplatelet therapy with aspirin and clopidogrel for at least 12 months. 2. Titrate nitroglycerin for relief of chest pain that occurred during atherectomy and balloon inflation as well as for improved blood pressure control. 3. Remove left femoral artery sheath with manual compression once ACT has fallen below 175 seconds. 4. Aggressive secondary prevention.  Patient Profile     86 y.o. male with a history of peripheral arterial disease with multiple prior amputations, CAD s/p RCA and LAD PCI, T2Dm, HTN, CKD3, and prostatic adenocarcinoma admitted s/p TPA in setting of  code stroke.  Assessment & Plan    1. Elevated troponin/hypertensive urgency: Medical management.   2. Acute CVA Note acute events yesterday and plans for some permissive HTN.   There is a question of an embolic event and he will need an out patient monitor.  We will send to him a 30 day monitor.    3. CAD On DAPT  4. Hypertension As above.  Meds adjusted.    He can have this followed as an outpatient. Continue current meds.      5. Hyperlipidemia: On Lipitor 40 mg daily.   6. CKD stage III: Creatinine improved to 1.52.      Signed, Minus Breeding  For questions or updates, please contact   Please consult [email or web address] for contact info under Cardiology/STEMI.

## 2020-01-08 DIAGNOSIS — I70263 Atherosclerosis of native arteries of extremities with gangrene, bilateral legs: Secondary | ICD-10-CM

## 2020-01-08 DIAGNOSIS — I739 Peripheral vascular disease, unspecified: Secondary | ICD-10-CM

## 2020-01-08 DIAGNOSIS — F0390 Unspecified dementia without behavioral disturbance: Secondary | ICD-10-CM

## 2020-01-08 DIAGNOSIS — I639 Cerebral infarction, unspecified: Secondary | ICD-10-CM | POA: Diagnosis not present

## 2020-01-08 DIAGNOSIS — Z794 Long term (current) use of insulin: Secondary | ICD-10-CM

## 2020-01-08 DIAGNOSIS — N1832 Chronic kidney disease, stage 3b: Secondary | ICD-10-CM | POA: Diagnosis not present

## 2020-01-08 DIAGNOSIS — E782 Mixed hyperlipidemia: Secondary | ICD-10-CM

## 2020-01-08 DIAGNOSIS — I2510 Atherosclerotic heart disease of native coronary artery without angina pectoris: Secondary | ICD-10-CM | POA: Diagnosis not present

## 2020-01-08 DIAGNOSIS — E1122 Type 2 diabetes mellitus with diabetic chronic kidney disease: Secondary | ICD-10-CM

## 2020-01-08 DIAGNOSIS — I96 Gangrene, not elsewhere classified: Secondary | ICD-10-CM

## 2020-01-08 DIAGNOSIS — N183 Chronic kidney disease, stage 3 unspecified: Secondary | ICD-10-CM

## 2020-01-08 DIAGNOSIS — D631 Anemia in chronic kidney disease: Secondary | ICD-10-CM

## 2020-01-08 LAB — BASIC METABOLIC PANEL
Anion gap: 10 (ref 5–15)
BUN: 33 mg/dL — ABNORMAL HIGH (ref 8–23)
CO2: 22 mmol/L (ref 22–32)
Calcium: 8.2 mg/dL — ABNORMAL LOW (ref 8.9–10.3)
Chloride: 107 mmol/L (ref 98–111)
Creatinine, Ser: 1.34 mg/dL — ABNORMAL HIGH (ref 0.61–1.24)
GFR, Estimated: 51 mL/min — ABNORMAL LOW (ref >60)
Glucose, Bld: 118 mg/dL — ABNORMAL HIGH (ref 70–99)
Potassium: 3.9 mmol/L (ref 3.5–5.1)
Sodium: 139 mmol/L (ref 135–145)

## 2020-01-08 LAB — CBC
HCT: 24.5 % — ABNORMAL LOW (ref 39.0–52.0)
Hemoglobin: 7.5 g/dL — ABNORMAL LOW (ref 13.0–17.0)
MCH: 25.8 pg — ABNORMAL LOW (ref 26.0–34.0)
MCHC: 30.6 g/dL (ref 30.0–36.0)
MCV: 84.2 fL (ref 80.0–100.0)
Platelets: 253 10*3/uL (ref 150–400)
RBC: 2.91 MIL/uL — ABNORMAL LOW (ref 4.22–5.81)
RDW: 18.3 % — ABNORMAL HIGH (ref 11.5–15.5)
WBC: 8.7 10*3/uL (ref 4.0–10.5)
nRBC: 0 % (ref 0.0–0.2)

## 2020-01-08 LAB — GLUCOSE, CAPILLARY
Glucose-Capillary: 94 mg/dL (ref 70–99)
Glucose-Capillary: 127 mg/dL — ABNORMAL HIGH (ref 70–99)
Glucose-Capillary: 113 mg/dL — ABNORMAL HIGH (ref 70–99)
Glucose-Capillary: 204 mg/dL — ABNORMAL HIGH (ref 70–99)

## 2020-01-08 LAB — HEPATIC FUNCTION PANEL
ALT: 39 U/L (ref 0–44)
AST: 28 U/L (ref 15–41)
Albumin: 2.4 g/dL — ABNORMAL LOW (ref 3.5–5.0)
Alkaline Phosphatase: 60 U/L (ref 38–126)
Bilirubin, Direct: <0.1 mg/dL (ref 0.0–0.2)
Total Bilirubin: 0.5 mg/dL (ref 0.3–1.2)
Total Protein: 5 g/dL — ABNORMAL LOW (ref 6.5–8.1)

## 2020-01-08 MED ORDER — GUAIFENESIN-DM 100-10 MG/5ML PO SYRP
5.0000 mL | ORAL_SOLUTION | Freq: Four times a day (QID) | ORAL | Status: DC | PRN
  Administered 2020-01-08 – 2020-01-10 (×5): 5 mL via ORAL
  Filled 2020-01-08 (×6): qty 5

## 2020-01-08 MED ORDER — HYDROCHLOROTHIAZIDE 12.5 MG PO CAPS
12.5000 mg | ORAL_CAPSULE | Freq: Every day | ORAL | Status: DC
  Administered 2020-01-08 – 2020-01-10 (×3): 12.5 mg via ORAL
  Filled 2020-01-08 (×3): qty 1

## 2020-01-08 MED ORDER — FERROUS SULFATE 325 (65 FE) MG PO TABS
325.0000 mg | ORAL_TABLET | Freq: Two times a day (BID) | ORAL | Status: DC
  Administered 2020-01-08 – 2020-01-10 (×4): 325 mg via ORAL
  Filled 2020-01-08 (×4): qty 1

## 2020-01-08 MED ORDER — HYDRALAZINE HCL 50 MG PO TABS
100.0000 mg | ORAL_TABLET | Freq: Three times a day (TID) | ORAL | Status: DC
  Administered 2020-01-08 – 2020-01-10 (×6): 100 mg via ORAL
  Filled 2020-01-08 (×6): qty 2

## 2020-01-08 MED ORDER — HYDRALAZINE HCL 50 MG PO TABS
75.0000 mg | ORAL_TABLET | Freq: Three times a day (TID) | ORAL | Status: DC
  Administered 2020-01-08: 75 mg via ORAL

## 2020-01-08 NOTE — Progress Notes (Signed)
Physical Therapy Treatment Patient Details Name: Peter [name] MRN: [id number] DOB: 08-Mar-1930 Today's Date: 01/08/2020    History of Present Illness Pt is an 86 yo male presenting with acute onset of fluctuating slurred speech, L sided weakness, and difficulty swallowing s/p tPA. MRI concerning for R MCA territory ischemia. CTA revealed a small amount of secretions within the proximal R mainstem bronchus, suggestive of aspiration risk. PMH includes: prostate ca, PAD, MI, mild dementia, HTN, HLD, gout, GERD, DM, CAD, CKD     PT Comments    Patient received in bed, very pleasant and cooperative but with ongoing poor insight into deficits and safety. Overall still needs Mod-MaxA to mobilize OOB with RW, still very weak with poor balance and limited functional activity tolerance. Deferred gait today as he kept having incontinent bowel movements/urination in standing, but we were able to pivot to the chair after multiple attempts. Fatigues very easily in standing. Left up in recliner with all needs met, chair alarm active and nursing staff aware of patient status. Agree that he would very much benefit from CIR stay.     Follow Up Recommendations  CIR     Equipment Recommendations  Wheelchair (measurements PT);Wheelchair cushion (measurements PT);Other (comment) (mechanical lift if home without CIR)    Recommendations for Other Services       Precautions / Restrictions Precautions Precautions: Fall Restrictions Weight Bearing Restrictions: No    Mobility  Bed Mobility Overal bed mobility: Needs Assistance Bed Mobility: Supine to Sit     Supine to sit: Min assist;HOB elevated     General bed mobility comments: able to get most of the way to EOB with min guard then needed MinA to scoot hips to being square at EOB  Transfers Overall transfer level: Needs assistance Equipment used: Rolling walker (2 wheeled) Transfers: Sit to/from [facility] Sit to Stand: Max  assist Stand pivot transfers: Mod assist       General transfer comment: maxA and extended time to really boost up into standing position, cues to extend knees and trunk, then ModA to gain balance; able to pivot wtih ModA for balance /RW  Ambulation/Gait             General Gait Details: deferred- multiple incontinent BMs in standing   Stairs             Wheelchair Mobility    Modified Rankin (Stroke Patients Only)       Balance Overall balance assessment: Needs assistance Sitting-balance support: Bilateral upper extremity supported;Feet supported Sitting balance-Leahy Scale: Poor Sitting balance - Comments: reliant on UE support due to posterior and left lean Postural control: Posterior lean;Left lateral lean   Standing balance-Leahy Scale: Zero Standing balance comment: mod-maxA due to posterior and left lateral lean                            Cognition Arousal/Alertness: Awake/alert Behavior During Therapy: WFL for tasks assessed/performed Overall Cognitive Status: History of cognitive impairments - at baseline                                 General Comments: significatn impairments in awareness and safety, poor processing and limited awareness of deficits      Exercises      General Comments        Pertinent Vitals/Pain Pain Assessment: 0-10 Pain Score: 7  Pain Location: R  toe Pain Descriptors / Indicators: Discomfort Pain Intervention(s): Monitored during session;Limited activity within patient's tolerance    Home Living                      Prior Function            PT Goals (current goals can now be found in the care plan section) Acute Rehab PT Goals Patient Stated Goal: get better, go home PT Goal Formulation: With patient Time For Goal Achievement: 01/14/20 Potential to Achieve Goals: Good Progress towards PT goals: Progressing toward goals (slowly)    Frequency    Min 4X/week      PT  Plan Current plan remains appropriate    Co-evaluation              AM-PAC PT "6 Clicks" Mobility   Outcome Measure  Help needed turning from your back to your side while in a flat bed without using bedrails?: A Little Help needed moving from lying on your back to sitting on the side of a flat bed without using bedrails?: A Lot Help needed moving to and from a bed to a chair (including a wheelchair)?: A Lot Help needed standing up from a chair using your arms (e.g., wheelchair or bedside chair)?: A Lot Help needed to walk in hospital room?: Total Help needed climbing 3-5 steps with a railing? : Total 6 Click Score: 11    End of Session Equipment Utilized During Treatment: Gait belt Activity Tolerance: Patient tolerated treatment well;Other (comment) (limited by multiple incontinent BMs) Patient left: in chair;with call bell/phone within reach;with chair alarm set Nurse Communication: Mobility status;Other (comment) (need for bath/incontinent BMs) PT Visit Diagnosis: Other abnormalities of gait and mobility (R26.89)     Time: 1000-1031 PT Time Calculation (min) (ACUTE ONLY): 31 min  Charges:  $Therapeutic Activity: 23-37 mins                     Windell Norfolk, DPT, PN1   Supplemental Physical Therapist [place]    Pager [phone] Acute Rehab Office [phone]

## 2020-01-08 NOTE — PMR Pre-admission (Signed)
PMR Admission Coordinator Pre-Admission Assessment  Patient: Peter [name] is an 86 y.o., male MRN: [id number] DOB: Aug 25, 1930 Height: '5\' 7"'  (170.2 cm) Weight: 72.5 kg  Insurance Information HMO:     PPO:      PCP:      IPA:      80/20: yes     OTHER:  PRIMARY: Medicare Part A and B      Policy#: [id number]      Subscriber:  Benefits:  Phone #:   Online passport once source 01/10/2020   Name:  Eff. Date:  A 04/11/1997 and B 07/12/2000 part A and 07/12/2000 part B.  Deductible $1484 CIR: 100%      SNF: 20 full days Outpatient: 80%     Co-Pay: 20% Home Health: 100%      Co-Pay:  DME: 80%     Co-Pay: 20% Providers: pt choice  SECONDARY: [employer] PPO:     Policy#: [id number]        The "Data Collection Information Summary" for patients in Inpatient Rehabilitation Facilities with attached "Privacy Act [place] Records" was provided and verbally reviewed with: Patient and wife  Emergency Contact Information   [name] is main Systems analyst Information    Name Relation Home Work Mobile   [name],[name] Daughter   [phone]   [name], [name] [phone]  [phone]   Diamond Nickel Daughter [phone]  [phone]      Current Medical History  Patient Admitting Diagnosis: R MCA ischemia  History of Present Illness  Peter [name] is a 86 y.o. right-handed male with history of CAD/PCI maintained on aspirin and Plavix, CKD stage III, dementia maintained on Aricept, diabetes mellitus, diastolic congestive heart failure, hypertension, hyperlipidemia, peripheral vascular disease, prostate cancer, history of multiple syncopal episodes.  Has s a personal care attendant 7 days a week 12-6 PM.  Two-level home bed and bath main level 3 steps to entry.  Reportedly ambulated with a rolling walker using a wheelchair also at baseline depending on needs for that day.  Presented 12/30/2019 with left-sided weakness and slurred speech.  Cranial CT scan showed no acute  changes.  Patient did receive TPA.  CT angiogram of head and neck negative for emergent large vessel occlusion.  Approximate 8 to 9 mm severe near occlusive stenosis of the proximal right M1 segment.  MRI of the brain showed patchy and hazy diffusion abnormality involving the right insular cortex subinsular white matter and overlying right frontal operculum consistent with acute right MCA territory ischemia.  No associated hemorrhage or mass-effect.  Admission chemistries unremarkable except glucose 130, troponin 35-50, alcohol negative, hemoglobin 8.7.  Echocardiogram with ejection fraction of 60 to 65% no wall motion abnormalities grade 2 diastolic dysfunction.  Hospital course gastroenterology consulted for anemia and Hemoccult positive stools.  Given patient's acute fresh CVA lack of overt destabilizing bleeding GI evaluation was deferred for now.  Would not pursue endoscopy or colonoscopy at least for the next 6 months.  Noted hemoglobin 7.5 patient was transfused 1 unit packed red blood cells 01/09/2020.  Cardiology services consulted for elevated troponin and history of CAD with PCI.  Patient currently maintained on aspirin  therapy advised to follow-up outpatient.  No ischemia work-up was planned.  Currently maintained on a regular consistency diet with nectar thick liquids.  Patient's [facility] from [facility]  has been reviewed by the rehabilitation admission coordinator and physician.  Past Medical History  Past Medical History:  Diagnosis Date  . Allergy   .  Anemia due to stage 3b chronic kidney disease ([place]) 10/25/2019  . Arthritis   . Carpal tunnel syndrome   . CKD (chronic kidney disease), stage III ([place])   . Coronary artery disease    a. 03/2018 PCI [facility]): RCA 95p, 66m(2.5x38 [place]); b. 06/2019 PCI: LM 40ost, LAD 85p, 60p/m (atherectomy w/ 3.0x48 Synergy DES p/m LAD), 548mLCX 50d, OM2/3 50, RCA patent prox/mid stent, 50d, RPDA 50.  .  Diabetes mellitus without complication ([facility]  . Diastolic dysfunction    a. 06/2019 Echo: EF 60-65%, no rwma, mod LVH, Gr1 DD. Nl RV fxn. Mildly dil LA. Mild-mod TR.  . Diverticulitis   . GERD (gastroesophageal reflux disease)   . Gout   . History of blood transfusion   . History of chicken pox   . Hyperlipidemia   . Hypertension   . Mild dementia ([facility] [place]  . Myocardial infarction ([facility] [place]02/2020  . Normocytic anemia    a. 06/2019 s/p 1u PRBCs.  . Marland KitchenAD (peripheral artery disease) ([facility]   a. 11/2018 s/p R SFA, popliteal, and peroneal artery PTA.  . [facility]   prostate  . Prostate cancer ([facility] [place]  . Syncope     Family History   family history includes Dementia in his father; Diabetes in his father and mother; Healthy in his daughter and daughter; Hypertension in his mother.  Prior Rehab/Hospitalizations Has the patient had prior rehab or hospitalizations prior to admission? Yes  Has the patient had major surgery during 100 days prior to admission? No   Current Medications  Current Facility-Administered Medications:  .  acetaminophen (TYLENOL) tablet 650 mg, 650 mg, Oral, Q4H PRN, 650 mg at 01/09/20 2124 **OR** acetaminophen (TYLENOL) 160 MG/5ML solution 650 mg, 650 mg, Per Tube, Q4H PRN **OR** acetaminophen (TYLENOL) suppository 650 mg, 650 mg, Rectal, Q4H PRN, Biby, Sharon L, NP .  allopurinol (ZYLOPRIM) tablet 100 mg, 100 mg, Oral, Daily, Biby, Sharon L, NP, 100 mg at 01/10/20 1127 .  amLODipine (NORVASC) tablet 10 mg, 10 mg, Oral, Daily, Biby, Sharon L, NP, 10 mg at 01/10/20 1127 .  aspirin EC tablet 81 mg, 81 mg, Oral, Daily, Biby, Sharon L, NP, 81 mg at 01/10/20 1126 .  atorvastatin (LIPITOR) tablet 40 mg, 40 mg, Oral, Daily, Biby, Sharon L, NP, 40 mg at 01/10/20 1127 .  carvedilol (COREG) tablet 6.25 mg, 6.25 mg, Oral, BID WC, Biby, Sharon L, NP, 6.25 mg at 01/10/20 1127 .  Chlorhexidine Gluconate Cloth 2 % PADS 6 each, 6 each, Topical, Daily, Biby, Sharon L, NP, 6  each at 01/10/20 1128 .  docusate sodium (COLACE) capsule 100 mg, 100 mg, Oral, Daily, Biby, Sharon L, NP, 100 mg at 01/10/20 1126 .  donepezil (ARICEPT) tablet 10 mg, 10 mg, Oral, Daily, Biby, Sharon L, NP, 10 mg at 01/10/20 1127 .  doxazosin (CARDURA) tablet 4 mg, 4 mg, Oral, Daily, Biby, Sharon L, NP, 4 mg at 01/10/20 1125 .  DULoxetine (CYMBALTA) DR capsule 30 mg, 30 mg, Oral, Daily, Biby, Sharon L, NP, 30 mg at 01/10/20 1127 .  ferrous sulfate tablet 325 mg, 325 mg, Oral, BID WC, XuRosalin HawkingMD, 325 mg at 01/10/20 1127 .  finasteride (PROSCAR) tablet 5 mg, 5 mg, Oral, Daily, Biby, Sharon L, NP, 5 mg at 01/10/20 1126 .  gabapentin (NEURONTIN) capsule 300 mg, 300 mg, Oral, BID, Biby, Sharon L, NP, 300 mg at 01/10/20 1126 .  guaiFENesin-dextromethorphan (ROBITUSSIN DM)  100-10 MG/5ML syrup 5 mL, 5 mL, Oral, Q6H PRN, Rosalin Hawking, MD, 5 mL at 01/10/20 0522 .  hydrALAZINE (APRESOLINE) tablet 100 mg, 100 mg, Oral, TID, Rosalin Hawking, MD, 100 mg at 01/10/20 1126 .  hydrochlorothiazide (MICROZIDE) capsule 12.5 mg, 12.5 mg, Oral, Daily, Rinehuls, David L, PA-C, 12.5 mg at 01/10/20 1128 .  insulin aspart (novoLOG) injection 0-15 Units, 0-15 Units, Subcutaneous, TID WC, Donzetta Starch, NP, 2 Units at 01/10/20 0636 .  insulin aspart (novoLOG) injection 0-5 Units, 0-5 Units, Subcutaneous, QHS, Biby, Massie Kluver, NP, 2 Units at 01/08/20 2208 .  insulin glargine (LANTUS) injection 20 Units, 20 Units, Subcutaneous, Daily, Rosalin Hawking, MD, 20 Units at 01/10/20 1128 .  isosorbide mononitrate (IMDUR) 24 hr tablet 60 mg, 60 mg, Oral, Daily, Biby, Sharon L, NP, 60 mg at 01/10/20 1127 .  labetalol (NORMODYNE) injection 20 mg, 20 mg, Intravenous, Q2H PRN, Biby, Sharon L, NP, 20 mg at 01/05/20 0556 .  memantine (NAMENDA) tablet 10 mg, 10 mg, Oral, BID, Biby, Sharon L, NP, 10 mg at 01/10/20 1128 .  multivitamin with minerals tablet 1 tablet, 1 tablet, Oral, Daily, Biby, Sharon L, NP, 1 tablet at 01/10/20 1126 .   pantoprazole (PROTONIX) EC tablet 40 mg, 40 mg, Oral, Daily, Rinehuls, David L, PA-C, 40 mg at 01/10/20 1126 .  [employer], , Oral, PRN, Rosalin Hawking, MD .  senna-docusate (Senokot-S) tablet 1 tablet, 1 tablet, Oral, QHS PRN, Donzetta Starch, NP .  ticagrelor (BRILINTA) tablet 90 mg, 90 mg, Oral, BID, Biby, Sharon L, NP, 90 mg at 01/10/20 1128  Patients Current Diet:  Diet Order            Diet - low sodium heart healthy           Diet regular Room service appropriate? Yes; Fluid consistency: Nectar Thick  Diet effective now                 Precautions / Restrictions Precautions Precautions: Fall Restrictions Weight Bearing Restrictions: No   Has the patient had 2 or more falls or a fall with injury in the past year? Yes  Prior Activity Level Limited Community (1-2x/wk): did drive locally; He was Mod I with RW and needed some supervision with ADLS  Prior Functional Level Self Care: Did the patient need help bathing, dressing, using the toilet or eating? Needed supervision with adls   Indoor Mobility: Did the patient need assistance with walking from room to room (with or without device)? Independent  Stairs: Did the patient need assistance with internal or external stairs (with or without device)? Independent  Functional Cognition: Did the patient need help planning regular tasks such as shopping or remembering to take medications? Dependent  Home Assistive Devices / Equipment Home Assistive Devices/Equipment: None Home Equipment: Environmental consultant - 2 wheels, Wheelchair - manual, Shower seat - built in, Engineer, civil (consulting), Environmental consultant - 4 wheels  Prior Device Use: Indicate devices/aids used by the patient prior to current illness, exacerbation or injury? Walker and wheelchair depending on how he was for the day  Current Functional Level Cognition  Arousal/Alertness: Awake/alert Overall Cognitive Status: History of cognitive impairments - at baseline Orientation Level:  Oriented X4 General Comments: oriented to month however deficits noted in relation to processing as well as safety awareness Attention: Sustained Sustained Attention: Appears intact Memory: Impaired Memory Impairment: Decreased recall of new information Awareness: Appears intact Problem Solving: Appears intact Safety/Judgment: Appears intact    Extremity Assessment (includes Sensation/Coordination)  Upper Extremity Assessment: Generalized weakness, RUE deficits/detail, LUE deficits/detail RUE Deficits / Details: AROM, WFLs LUE Deficits / Details: AROM, WFLs; small 3- to 3+/5 MM grade shoulder through digits  Lower Extremity Assessment: Overall WFL for tasks assessed, Defer to PT evaluation RLE Deficits / Details: geneeral weakness, pt relates acute on chronic in nature LLE Deficits / Details: mild general weakness LLE Coordination: decreased fine motor    ADLs  Overall ADL's : Needs assistance/impaired Grooming: Oral care, Minimal assistance, Standing, Supervision/safety, Sitting Grooming Details (indicate cue type and reason): minA during standing portion; supervision while seated, pt needing to sit for rest break during task completion, completing remainder of task while seated  Upper Body Dressing : Minimal assistance, Sitting Upper Body Dressing Details (indicate cue type and reason): donning new gown  Lower Body Dressing: Total assistance, Sitting/lateral leans Lower Body Dressing Details (indicate cue type and reason): to don new socks from [facility] Toilet Transfer: Minimal assistance, Moderate assistance, BSC, RW, Stand-pivot Toilet Transfer Details (indicate cue type and reason): standpivot transfer from EOB>BSC with RW, up to MOD A when transitioning BUES from sitting surface to RW Toileting- Clothing Manipulation and Hygiene: Sit to/from stand, Total assistance Toileting - Clothing Manipulation Details (indicate cue type and reason): total A for posterior pericare in  standing Functional mobility during ADLs: Minimal assistance, Moderate assistance, Cueing for sequencing, Rolling walker General ADL Comments: pt continues to present with balance deficits, decreased activity tolerance, and generalized weakness impacting pts ability to complete BADLs    Mobility  Overal bed mobility: Needs Assistance Bed Mobility: Sit to Supine Supine to sit: Min assist, HOB elevated Sit to supine: Min assist General bed mobility comments: light MIN A to elevate BLEs back to bed    Transfers  Overall transfer level: Needs assistance Equipment used: Rolling walker (2 wheeled) Transfers: Sit to/from Stand, [employer] Transfers Sit to Stand: Min assist, Mod assist Stand pivot transfers: Min assist General transfer comment: pt completed x4 sit<>stands during session. pt required up to MOD A to stand needing most assist to steady self when transitioning BUES from sitting surface to RW, however as session progressed pt sit<>stand with MIN A. Pt completed x2 stand pivot transfers with MIN A needing assist to maneuver RW and cues to sequene pivotal steps    Ambulation / Gait / Stairs / Wheelchair Mobility  Ambulation/Gait Ambulation/Gait assistance: Herbalist (Feet): 10 Feet (x2 bouts (~5 ft anterior then ~5 ft posterior)`) Assistive device: Rolling walker (2 wheeled) Gait Pattern/deviations: Shuffle, Decreased stride length, Trunk flexed General Gait Details: Provided verbal and visual cues for pt to inc step length through cuing pt to step on therapist's toes, mod success. Pt demonstrated significantly improved bilat weight shifting for inc bilat step length on second bout when provided visual demonstration prior.  Gait velocity: reduced Gait velocity interpretation: <1.31 ft/sec, indicative of household ambulator    Posture / Balance Dynamic Sitting Balance Sitting balance - Comments: at least one UE supported during static sitting while pt sitting EOB to  talk on phone Balance Overall balance assessment: Needs assistance Sitting-balance support: Feet supported, Single extremity supported Sitting balance-Leahy Scale: Poor Sitting balance - Comments: at least one UE supported during static sitting while pt sitting EOB to talk on phone Postural control: Posterior lean, Left lateral lean Standing balance support: Bilateral upper extremity supported, During functional activity Standing balance-Leahy Scale: Poor Standing balance comment: reliant on BUE support and external assist    Special needs/care consideration Skin: 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  Diabetic management: DM2 managed with sQ Novolog and Lantus; Hgb A1c 6.0 To consider 30 day cardiac event monitor as outpatint to rule out Afib Palliative for pain management   Previous Home Environment  Living Arrangements: Spouse/significant other  Lives With: Spouse Available Help at Discharge: Family, Personal care attendant, Other (Comment), Available PRN/intermittently Type of Home: House Home Layout:  (stays on ML) Alternate Level Stairs-Rails: Right, Left Alternate Level Stairs-Number of Steps: flight Home Access: Stairs to enter Entrance Stairs-Rails: Right, Left, Can reach both Entrance Stairs-Number of Steps: 3 Bathroom Shower/Tub: Multimedia programmer: Handicapped height Bathroom Accessibility: Yes How Accessible: Accessible via walker [place]:  (aide private pay, can increase as needed) Additional Comments: [name] main contact; pt's wife has Huntington's disease with cognition issues  Discharge Living Setting Plans for Discharge Living Setting: Patient's home Type of Home at Discharge: House Discharge Home Layout: Multi-level Alternate Level Stairs-Rails: Right Alternate Level Stairs-Number of Steps: 15 (15) Discharge Home Access: Stairs to enter Entrance Stairs-Rails: Can  reach both Entrance Stairs-Number of Steps: 4 Discharge Bathroom Shower/Tub: Walk-in shower Discharge Bathroom Toilet: Handicapped height Discharge Bathroom Accessibility: Yes How Accessible: Accessible via wheelchair, Accessible via walker Does the patient have any problems obtaining your medications?: No  Social/Family/Support Systems Patient Roles: Spouse Contact Information: [name] is main contact, wife with Huntington's Anticipated Caregiver: [name], is main contact Anticipated Ambulance person Information: see above Ability/Limitations of Caregiver: [name] lives in [place]. Patient and spouse live alone Caregiver Availability:  (SPouse avialalbe 24/7 but patient was MOd I with RW and supe) Discharge Plan Discussed with Primary Caregiver: Yes Is Caregiver In Agreement with Plan?: Yes Does Caregiver/Family have Issues with Lodging/Transportation while Pt is in Rehab?: No  [name] lives in [place] and [place] lives in [place].  So Intermittent assist. [name] is primary to talk to besides the wife. Wife has Huntington's, [name] is an Network engineer.  Goals Patient/Family Goal for Rehab: PT, OT, and SLP Mod I to supervision Expected length of stay: 12-14 days Additional Information: followed by palliative for symptom pain management for back and knee pain Pt/Family Agrees to Admission and willing to participate: Yes Program Orientation Provided & Reviewed with Pt/Caregiver Including Roles  & Responsibilities: Yes Additional Information Needs: retired Arts development officer; loves music, plays trumpet  Decrease burden of Care through IP rehab admission: n/a  Possible need for SNF placement upon discharge: not anticipated  Patient Condition: I have reviewed medical records from [facility] , spoken with CM, and patient and daughter. I met with patient at the bedside and discussed via phone for inpatient rehabilitation assessment.  Patient will benefit from  ongoing PT, OT and SLP, can actively participate in 3 hours of therapy a day 5 days of the week, and can make measurable gains during the admission.  Patient will also benefit from the coordinated team approach during an Inpatient Acute Rehabilitation admission.  The patient will receive intensive therapy as well as Rehabilitation physician, nursing, social worker, and care management interventions.  Due to safety, disease management, medication administration, pain management and patient education the patient requires 24 hour a day rehabilitation nursing.  The patient is currently  Min to mod asisst overall with mobility and basic ADLs.  Discharge setting and therapy post discharge at home with home health is anticipated.  Patient has agreed to participate in the Acute Inpatient Rehabilitation Program  and will admit today.  Preadmission Screen Completed By:  Cleatrice Burke, 01/10/2020 12:00 PM ______________________________________________________________________   Discussed status with Dr. Ranell Patrick on  01/10/2020  at  1145 and received approval for admission today.  Admission Coordinator:  Cleatrice Burke, RN, time  1499 Date  01/10/2020   Assessment/Plan: Diagnosis: R MCA stroke, likely embolic 1. Does the need for close, 24 hr/day Medical supervision in concert with the patient's rehab needs make it unreasonable for this patient to be served in a less intensive setting? Yes 2. Co-Morbidities requiring supervision/potential complications: HTN, DM type 2, CKD stage 3b, dementia, bradycardia, peripheral neuropathy, HLD 3. Due to bladder management, bowel management, safety, skin/wound care, disease management, medication administration, pain management and patient education, does the patient require 24 hr/day rehab nursing? Yes 4. Does the patient require coordinated care of a physician, rehab nurse, PT, OT to address physical and functional deficits in the context of the above medical  diagnosis(es)? Yes Addressing deficits in the following areas: balance, endurance, locomotion, strength, transferring, bowel/bladder control, bathing, dressing, feeding, grooming, toileting and psychosocial support 5. Can the patient actively participate in an intensive therapy program of at least 3 hrs of therapy 5 days a week? Yes 6. The potential for patient to make measurable gains while on inpatient rehab is excellent 7. Anticipated functional outcomes upon discharge from inpatient rehab: modified independent PT, modified independent OT, modified independent SLP 8. Estimated rehab length of stay to reach the above functional goals is: 10-12 days 9. Anticipated discharge destination: Home 10. Overall Rehab/Functional Prognosis: excellent   MD Signature: Leeroy Cha, MD

## 2020-01-08 NOTE — Progress Notes (Signed)
STROKE TEAM PROGRESS NOTE   INTERVAL HISTORY No family is at the bedside. Pt sitting in chair, awake alert and orientated. Not in acute distress. PT/OT still recommending CIR due to Max assist. Pt neuro stable. He continues to have high BP, will increase hydralazine dose. Pt Hb drop to 7.5 from 8.2, no overt bleeding. Will do FOBT, increase iron pills to bid.   OBJECTIVE Vitals:   01/08/20 0400 01/08/20 0403 01/08/20 0415 01/08/20 0827  BP: (!) 194/51 (!) 195/53 (!) 180/60 (!) 192/58  Pulse: 62 65  66  Resp: 18 18 14 20   Temp: 98 F (36.7 C) 98 F (36.7 C)  97.8 F (36.6 C)  TempSrc: Oral Oral  Oral  SpO2: 98% 98%  96%  Weight:      Height:       CBC:  Recent Labs  Lab 01/04/20 0249 01/08/20 0313  WBC 9.3 8.7  NEUTROABS 6.9  --   HGB 8.2* 7.5*  HCT 25.5* 24.5*  MCV 82.8 84.2  PLT 255 147   Basic Metabolic Panel:  Recent Labs  Lab 01/05/20 1145 01/08/20 0313  NA 137 139  K 4.0 3.9  CL 109 107  CO2 20* 22  GLUCOSE 241* 118*  BUN 37* 33*  CREATININE 1.52* 1.34*  CALCIUM 7.8* 8.2*   Lipid Panel:     Component Value Date/Time   CHOL 85 12/31/2019 0318   TRIG 152 (H) 12/31/2019 0318   HDL 44 12/31/2019 0318   CHOLHDL 1.9 12/31/2019 0318   VLDL 30 12/31/2019 0318   LDLCALC 11 12/31/2019 0318   HgbA1c:  Lab Results  Component Value Date   HGBA1C 6.0 (H) 12/31/2019   Urine Drug Screen:     Component Value Date/Time   LABOPIA NONE DETECTED 12/31/2019 1806   COCAINSCRNUR NONE DETECTED 12/31/2019 1806   LABBENZ NONE DETECTED 12/31/2019 1806   AMPHETMU NONE DETECTED 12/31/2019 1806   THCU NONE DETECTED 12/31/2019 1806   LABBARB NONE DETECTED 12/31/2019 1806    Alcohol Level     Component Value Date/Time   ETH <10 12/30/2019 2315    IMAGING  CT Code Stroke CTA Head W/WO contrast CT Code Stroke CTA Neck W/WO contrast 12/31/2019 IMPRESSION:  1. Negative CTA for emergent large vessel occlusion.  2. Approximate 8-9 mm severe near occlusive stenosis of  the proximal right M1 segment. Right MCA branches attenuated but patent distally with no visible downstream occlusion.  3. Additional severe proximal right A1 stenosis.  4. Atheromatous change about the carotid bifurcations with associated stenoses of up to 50% on the right and 45% on the left.  5. Atheromatous change about the right carotid siphon with associated moderate to severe multifocal stenoses.  6. Small amount of secretions within the proximal right mainstem bronchus, suggesting that this patient is at risk for aspiration.   MR BRAIN WO CONTRAST 12/31/2019 IMPRESSION:  1. Patchy and hazy diffusion abnormality involving the right insular cortex, subinsular white matter, and overlying right frontal operculum, consistent with acute right MCA territory ischemia. No associated hemorrhage or mass effect. Finding is consistent with the previously identified severe right M1 stenosis.  2. No other acute intracranial abnormality.  3. Age-related cerebral atrophy with mild chronic small vessel ischemic disease.   CT HEAD CODE STROKE WO CONTRAST 12/30/2019 IMPRESSION:  1. No acute intracranial infarct or other abnormality.  2. ASPECTS is 10.  3. Age-appropriate cerebral atrophy with mild chronic small vessel ischemic disease.   CT HEAD WO CONTRAST -  post tPA 01/01/20 IMPRESSION: 1. Patchy evolving small volume acute ischemic infarcts involving the right subinsular white matter, adjacent insular cortex, and overlying operculum, corresponding with findings on prior MRI. No interval hemorrhage, mass effect, or other complication. 2. No other new acute intracranial abnormality.  CT HEAD WO CONTRAST  01/06/20 IMPRESSION:  Low-density now evident in the deep insular cortex and underlying white matter tracks on the right consistent with acute infarction in that location shown by recent MRI.   More widespread right insular cortex shows some loss of gray-white differentiation but no pronounced  swelling.   No evidence of regional hemorrhage.  DG Chest 2 View 12/30/2019 IMPRESSION:  1. Stable cardiomegaly. No pulmonary venous congestion.  2. Low lung volumes.   Transthoracic Echocardiogram  1. Left ventricular ejection fraction, by estimation, is 60 to 65%. The left ventricle has normal function. The left ventricle has no regional wall motion abnormalities. There is mild concentric left ventricular hypertrophy. Left ventricular diastolic parameters are consistent with Grade II diastolic dysfunction (pseudonormalization). Elevated left atrial pressure. The E/e' is 88.  2. Right ventricular systolic function is normal. The right ventricular size is normal. Tricuspid regurgitation signal is inadequate for assessing PA pressure.  3. Left atrial size was mildly dilated.  4. The mitral valve is degenerative. No evidence of mitral valve regurgitation. No evidence of mitral stenosis.  5. The aortic valve is tricuspid. Aortic valve regurgitation is not visualized. No aortic stenosis is present.  6. The inferior vena cava is normal in size with greater than 50% respiratory variability, suggesting right atrial pressure of 3 mmHg.  ECG - SR rate 70 BPM. (See cardiology reading for complete details)   PHYSICAL EXAM    General - Well nourished, well developed elderly male, in no apparent distress.  Ophthalmologic - fundi not visualized due to noncooperation.  Cardiovascular - Regular rhythm and rate.  Mental Status -  Level of arousal and orientation to time, place, and person were intact.  There is minimal dysarthria noted. Language including expression, naming, repetition, comprehension was assessed and found intact.  Cranial Nerves II - XII - II - Visual field intact OU. III, IV, VI - Extraocular movements intact. V - Facial sensation intact bilaterally. VII - mild left facial droop. VIII - Hearing & vestibular intact bilaterally. X - Palate elevates symmetrically. XI -  Chin turning & shoulder shrug intact bilaterally. XII - Tongue protrusion intact.  Motor Strength - The patient's strength was normal in all extremities except mild left UE pronator drift.  There is clawhand deformity of the hands bilaterally more profound on the right side Likely from prior ulnar nerve damage at the elbow.  There is marked global atrophy of the intrinsic hand muscles on the right and mild on the left.  These are associated with weakness of the hand muscles bilaterally. Motor Tone - Muscle tone was assessed at the neck and was normal.  Reflexes - The patient's reflexes were symmetrical in all extremities and he had no pathological reflexes.  Sensory - Light touch, temperature/pinprick were assessed and were symmetrical.    Coordination - The patient had normal movements in the hands with no ataxia or dysmetria on right side. Mild left sided ataxia.    Gait and Station - deferred.   ASSESSMENT/PLAN [name] is a 86 y.o. male with history of coronary artery disease, CKD stage III, dementia, diabetes, diastolic dysfunction, peripheral vascular disease, prostate cancer, history of multiple syncopal episodes, presented to the emergency room  for evaluation of sudden onset of slurred speech, difficulty swallowing and left-sided weakness. He was scheduled for a GI scoping today, but could not go ahead with the procedure because of severely elevated blood pressures with systolic in the 761P.  His Plavix was on hold for procedure. The patient received IV t-PA Friday 12/31/19 at [place].  Stroke: acute right MCA pachy infarcts with right M1 occlusion s/p tPA - cardioembolic vs. Large vessel disease source  CT Head - No acute intracranial infarct or other abnormality. ASPECTS is 10.   CTA H&N - Approximate 8-9 mm severe near occlusive stenosis of the proximal right M1 segment. Right MCA branches attenuated but patent distally with no visible downstream occlusion. Additional severe  proximal right A1 stenosis. ICA stenosis up to 50% on the right and 45% on the left. Atheromatous change about the right carotid siphon with associated moderate to severe multifocal stenoses.  MRI head - Patchy and hazy diffusion abnormality involving the right insular cortex, subinsular white matter, and overlying right frontal operculum, consistent with acute right MCA territory ischemia. No associated hemorrhage or mass effect. Finding is consistent with the previously identified severe right M1 stenosis.   CT head - 11/20 and 11/25 - Patchy evolving small volume acute ischemic infarcts involving the right subinsular white matter, adjacent insular cortex, and overlying operculum, corresponding with findings on prior MRI. No interval hemorrhage  2D Echo - EF 60 - 65%. No cardiac source of emboli identified.   Consider 30 day cardiac event monitoring as outpt to rule out afib  [employer] Virus 2 - negative  LDL - 11  HgbA1c - 6.0  UDS neg  VTE prophylaxis - SCDs  aspirin 81 mg daily and clopidogrel 75 mg daily (Plavix held 5 days for colonoscopy) prior to admission, now on Aspirin 81 mg daily and brillinta. Per Dr. Leonie Man, continue DAPT for 3 months, then brillinta alone.    Therapy recommendations:  CIR  Disposition:  Pending  CAD s/p stent Elevated troponin  Troponin 35->50->215>209  EKG no ST-T change  TTE - EF 60 - 65%. No cardiac source of emboli identified.   Cardiology on board - no work up planned  On DAPT now, continue on discharge  Hypertensive Urgency Bradycardia on BB  Home BP meds: Cozaar ; amlodipine ; HCTZ ; Coreg ; hydralazine ; Cardura  BP remains up this am - po meds again increased  Off cleviprex - now on Coreg 6.25mg  bid; Norvasc 10mg  daily ; imdur 60mg  daily; Cardura 4mg  daily ; and hydralazine 100mg  Tid  Avoid labetalol if HR down, discussed with RN . Long-term BP goal normotensive  Hyperlipidemia  Home Lipid lowering medication: Lipitor 80  mg daily  LDL 11, goal < 70  Current lipid lowering medication: decrease to lipitor 40   Continue statin at discharge  Diabetes  Home diabetic meds: insulin  Current diabetic meds: SSI   HgbA1c 6.0, goal < 7.0  Controlled  CBG monitoring  Follow up with PCP  Anemia of chronic disease   Hgb - 8.2->7.5 (monitor)  FOBT pending  Increase iron pill to bid  11/18 GI admission for EGD/colonoscopy 2/2 anemia and diarrhea  Discussed with Dr. Paulita Fujita, he will see pt tomorrow  CBC monitoring  Dysphagia   Speech on board  Passed MBS  On regular and nectar thick  Off IVF  Other Stroke Risk Factors  Advanced age  Former cigarette smoker - quit  ETOH use, advised to drink no more than 1  alcoholic beverage per day.  Other Active Problems  Code status - DNR   Dementia on Aricept and nemanda - continue   Hx of multiple syncopal episode  CKD - stage 3a - Cre - 1.30->1.47->1.69->2.30->1.86->1.52->1.34  Necrotic R foot 2nd toe w/o planned amputation per podiatry, [facility]VS 12/2019. Dsg changes daily.  Chronic diarrhea - C-diff quick screen ordered 01/01/20 - negatvie - stopped enteric isolation - GI will see him tomorrow  Hospital day # 8  Patient condition worsened within the last 24 hours, has developed generalized weakness, worsening anemia, diarrhea, and I have ordered FOBT and called GI consult. I discussed with Dr. Paulita Fujita. I spent  35 minutes in total face-to-face time with the patient, more than 50% of which was spent in counseling and coordination of care, reviewing test results, images and medication, and discussing the diagnosis, treatment plan and potential prognosis. This patient's care requiresreview of multiple databases, neurological assessment, discussion with family, other specialists and medical decision making of high complexity.  Rosalin Hawking, MD PhD Stroke Neurology 01/08/2020 12:26 PM    To contact Stroke Continuity provider, please refer to  [email or web address] After hours, contact General Neurology

## 2020-01-08 NOTE — Progress Notes (Signed)
Progress Note  Patient Name: Peter [name] Date of Encounter: 01/08/2020  Primary Cardiologist: [name], MD   Subjective   Noted two days ago to have increased dysarthria and required IV fluid.  He is coughing up a lot of mucous today.   Inpatient Medications    Scheduled Meds: . allopurinol  100 mg Oral Daily  . amLODipine  10 mg Oral Daily  . aspirin EC  81 mg Oral Daily  . atorvastatin  40 mg Oral Daily  . carvedilol  6.25 mg Oral BID WC  . Chlorhexidine Gluconate Cloth  6 each Topical Daily  . docusate sodium  100 mg Oral Daily  . donepezil  10 mg Oral Daily  . doxazosin  4 mg Oral Daily  . DULoxetine  30 mg Oral Daily  . ferrous sulfate  325 mg Oral Daily  . finasteride  5 mg Oral Daily  . gabapentin  300 mg Oral BID  . hydrALAZINE  50 mg Oral TID  . insulin aspart  0-15 Units Subcutaneous TID WC  . insulin aspart  0-5 Units Subcutaneous QHS  . insulin glargine  20 Units Subcutaneous QHS  . isosorbide mononitrate  60 mg Oral Daily  . memantine  10 mg Oral BID  . multivitamin with minerals  1 tablet Oral Daily  . ticagrelor  90 mg Oral BID   Continuous Infusions: . sodium chloride    . sodium chloride Stopped (01/06/20 0900)   PRN Meds: acetaminophen **OR** acetaminophen (TYLENOL) oral liquid 160 mg/5 mL **OR** acetaminophen, labetalol, senna-docusate   Vital Signs    Vitals:   01/08/20 0000 01/08/20 0400 01/08/20 0403 01/08/20 0415  BP: (!) 175/51 (!) 194/51 (!) 195/53 (!) 180/60  Pulse: 65 62 65   Resp: 18 18 18 14   Temp: 98.6 F (37 C) 98 F (36.7 C) 98 F (36.7 C)   TempSrc: Oral Oral Oral   SpO2:  98% 98%   Weight:      Height:        Intake/Output Summary (Last 24 hours) at 01/08/2020 0827 Last data filed at 01/07/2020 1724 Gross per 24 hour  Intake --  Output 250 ml  Net -250 ml   Filed Weights   01/06/20 0840  Weight: 72.5 kg    Physical Exam   GEN: No  acute distress.   Neck: No  JVD Cardiac: RRR, no murmurs,  rubs, or gallops.  Respiratory:   Bilateral left greater than right coarse crackles.  GI: Soft, nontender, non-distended, normal bowel sounds  MS:  No edema; No deformity. Neuro:   Nonfocal  Psych: Oriented and appropriate     Labs    Chemistry Recent Labs  Lab 01/04/20 0249 01/05/20 1145 01/08/20 0313  NA 139 137 139  K 3.9 4.0 3.9  CL 109 109 107  CO2 20* 20* 22  GLUCOSE 166* 241* 118*  BUN 40* 37* 33*  CREATININE 1.86* 1.52* 1.34*  CALCIUM 7.7* 7.8* 8.2*  PROT  --   --  5.0*  ALBUMIN  --   --  2.4*  AST  --   --  28  ALT  --   --  39  ALKPHOS  --   --  60  BILITOT  --   --  0.5  GFRNONAA 35* 44* 51*  ANIONGAP 10 8 10      Hematology Recent Labs  Lab 01/03/20 0147 01/04/20 0249 01/08/20 0313  WBC 10.1 9.3 8.7  RBC 3.22* 3.08*  2.91*  HGB 8.5* 8.2* 7.5*  HCT 26.5* 25.5* 24.5*  MCV 82.3 82.8 84.2  MCH 26.4 26.6 25.8*  MCHC 32.1 32.2 30.6  RDW 17.7* 17.9* 18.3*  PLT 273 255 253    Cardiac EnzymesNo results for input(s): TROPONINI in the last 168 hours. No results for input(s): TROPIPOC in the last 168 hours.   BNPNo results for input(s): BNP, PROBNP in the last 168 hours.   DDimer No results for input(s): DDIMER in the last 168 hours.   Radiology    CT HEAD WO CONTRAST  Result Date: 01/06/2020 CLINICAL DATA:  Follow-up stroke EXAM: CT HEAD WITHOUT CONTRAST TECHNIQUE: Contiguous axial images were obtained from the base of the skull through the vertex without intravenous contrast. COMPARISON:  01/01/2020 CT.  12/31/2019 MRI. FINDINGS: Brain: The brainstem and cerebellum are unremarkable. Chronic small-vessel ischemic changes of the deep white matter appear similar to the previous study. Low-density now evident in the deep insular cortex on the right and underlying white matter tracks consistent with acute infarction in that location shown by recent MRI. More widespread right insular cortex shows some loss of gray-white differentiation but no pronounced  swelling. No evidence of regional hemorrhage. No hydrocephalus or extra-axial collection Vascular: There is atherosclerotic calcification of the major vessels at the base of the brain. Skull: Negative Sinuses/Orbits: Clear/normal Other: None IMPRESSION: Low-density now evident in the deep insular cortex and underlying white matter tracks on the right consistent with acute infarction in that location shown by recent MRI. More widespread right insular cortex shows some loss of gray-white differentiation but no pronounced swelling. No evidence of regional hemorrhage. Electronically Signed   By: Nelson Chimes M.D.   On: 01/06/2020 14:47    Telemetry    NSR, SB - Personally Reviewed  ECG    No new tracing as of 01/05/20- Personally Reviewed  Cardiac Studies   Echo 12/31/19: 1. Left ventricular ejection fraction, by estimation, is 60 to 65%. The  left ventricle has normal function. The left ventricle has no regional  wall motion abnormalities. There is mild concentric left ventricular  hypertrophy. Left ventricular diastolic  parameters are consistent with Grade II diastolic dysfunction  (pseudonormalization). Elevated left atrial pressure. The E/e' is 37.  2. Right ventricular systolic function is normal. The right ventricular  size is normal. Tricuspid regurgitation signal is inadequate for assessing  PA pressure.  3. Left atrial size was mildly dilated.  4. The mitral valve is degenerative. No evidence of mitral valve  regurgitation. No evidence of mitral stenosis.  5. The aortic valve is tricuspid. Aortic valve regurgitation is not  visualized. No aortic stenosis is present.  6. The inferior vena cava is normal in size with greater than 50%  respiratory variability, suggesting right atrial pressure of 3 mmHg.    Coronary stent intervention 07/05/19: Conclusions: 1. Severely diseased proximal and mid LAD with multifocal stenoses of up to 80-90% and heavy  calcification. 2. Successful IVUS-guided orbital atherectomy and PCI of the ostial through mid LAD using a Synergy 3.0 x 48 mm drug-eluting stent (postdilated up to 3.9 mm) with less than 10% residual stenosis and TIMI-3 flow.  Recommendations: 1. Continue dual antiplatelet therapy with aspirin and clopidogrel for at least 12 months. 2. Titrate nitroglycerin for relief of chest pain that occurred during atherectomy and balloon inflation as well as for improved blood pressure control. 3. Remove left femoral artery sheath with manual compression once ACT has fallen below 175 seconds. 4. Aggressive secondary prevention.  Patient Profile     86 y.o. male with a history of peripheral arterial disease with multiple prior amputations, CAD s/p RCA and LAD PCI, T2Dm, HTN, CKD3, and prostatic adenocarcinoma admitted s/p TPA in setting of code stroke.  Assessment & Plan    1. Elevated troponin/hypertensive urgency: No ischemia work up is planned   2. Acute CVA AMS two nights ago when BP below 130.  Plans for some permissive HTN.    We can see if he can get an event monitor applied on Monday if he is here either before he goes to rehab in our out patient.   3. CAD On DAPT  4. Hypertension BP has crept back up above target 150s.  His clonidine was held and hydral decreased two days ago.  I would suggest going back up on the hydralazine to 75 mg tid but we can hold the clonidine still.    5. Hyperlipidemia: On Lipitor 40 mg daily.   6. CKD stage III: Creatinine back to baseline.   7.  Cough: Needs good pulmonary toilet.  Ordered incentive spirometer.    Signed, Minus Breeding  For questions or updates, please contact   Please consult [email or web address] for contact info under Cardiology/STEMI.

## 2020-01-08 NOTE — Progress Notes (Signed)
Inpatient Rehab Admissions Coordinator:   Met with patient at bedside to discuss potential CIR admission. Pt. Stated interest. Will pursue for potential admit next week, pending bed availability.  Maeryn Mcgath, MS, CCC-SLP Rehab Admissions Coordinator  [phone] (celll) [phone] (office) 

## 2020-01-09 ENCOUNTER — Inpatient Hospital Stay (HOSPITAL_COMMUNITY): Payer: Medicare Other

## 2020-01-09 DIAGNOSIS — D500 Iron deficiency anemia secondary to blood loss (chronic): Secondary | ICD-10-CM

## 2020-01-09 DIAGNOSIS — R059 Cough, unspecified: Secondary | ICD-10-CM

## 2020-01-09 DIAGNOSIS — I2510 Atherosclerotic heart disease of native coronary artery without angina pectoris: Secondary | ICD-10-CM | POA: Diagnosis not present

## 2020-01-09 DIAGNOSIS — I639 Cerebral infarction, unspecified: Secondary | ICD-10-CM | POA: Diagnosis not present

## 2020-01-09 DIAGNOSIS — I70263 Atherosclerosis of native arteries of extremities with gangrene, bilateral legs: Secondary | ICD-10-CM | POA: Diagnosis not present

## 2020-01-09 DIAGNOSIS — N1832 Chronic kidney disease, stage 3b: Secondary | ICD-10-CM | POA: Diagnosis not present

## 2020-01-09 LAB — BASIC METABOLIC PANEL
Anion gap: 11 (ref 5–15)
BUN: 30 mg/dL — ABNORMAL HIGH (ref 8–23)
CO2: 22 mmol/L (ref 22–32)
Calcium: 8.2 mg/dL — ABNORMAL LOW (ref 8.9–10.3)
Chloride: 106 mmol/L (ref 98–111)
Creatinine, Ser: 1.23 mg/dL (ref 0.61–1.24)
GFR, Estimated: 57 mL/min — ABNORMAL LOW (ref >60)
Glucose, Bld: 96 mg/dL (ref 70–99)
Potassium: 3.9 mmol/L (ref 3.5–5.1)
Sodium: 139 mmol/L (ref 135–145)

## 2020-01-09 LAB — GLUCOSE, CAPILLARY
Glucose-Capillary: 45 mg/dL — ABNORMAL LOW (ref 70–99)
Glucose-Capillary: 77 mg/dL (ref 70–99)
Glucose-Capillary: 146 mg/dL — ABNORMAL HIGH (ref 70–99)
Glucose-Capillary: 132 mg/dL — ABNORMAL HIGH (ref 70–99)
Glucose-Capillary: 139 mg/dL — ABNORMAL HIGH (ref 70–99)

## 2020-01-09 LAB — CBC
HCT: 23.9 % — ABNORMAL LOW (ref 39.0–52.0)
Hemoglobin: 7.5 g/dL — ABNORMAL LOW (ref 13.0–17.0)
MCH: 26 pg (ref 26.0–34.0)
MCHC: 31.4 g/dL (ref 30.0–36.0)
MCV: 83 fL (ref 80.0–100.0)
Platelets: 261 10*3/uL (ref 150–400)
RBC: 2.88 MIL/uL — ABNORMAL LOW (ref 4.22–5.81)
RDW: 18 % — ABNORMAL HIGH (ref 11.5–15.5)
WBC: 8.1 10*3/uL (ref 4.0–10.5)
nRBC: 0 % (ref 0.0–0.2)

## 2020-01-09 LAB — OCCULT BLOOD X 1 CARD TO LAB, STOOL: Fecal Occult Bld: POSITIVE — AB

## 2020-01-09 LAB — PREPARE RBC (CROSSMATCH)

## 2020-01-09 IMAGING — DX DG CHEST 1V PORT
1 series · 1 of 1 positions shown · non-contrast
Comparison: Chest radiograph dated [DATE]

CLINICAL DATA: Cough

EXAM:
PORTABLE CHEST 1 VIEW

[chest]
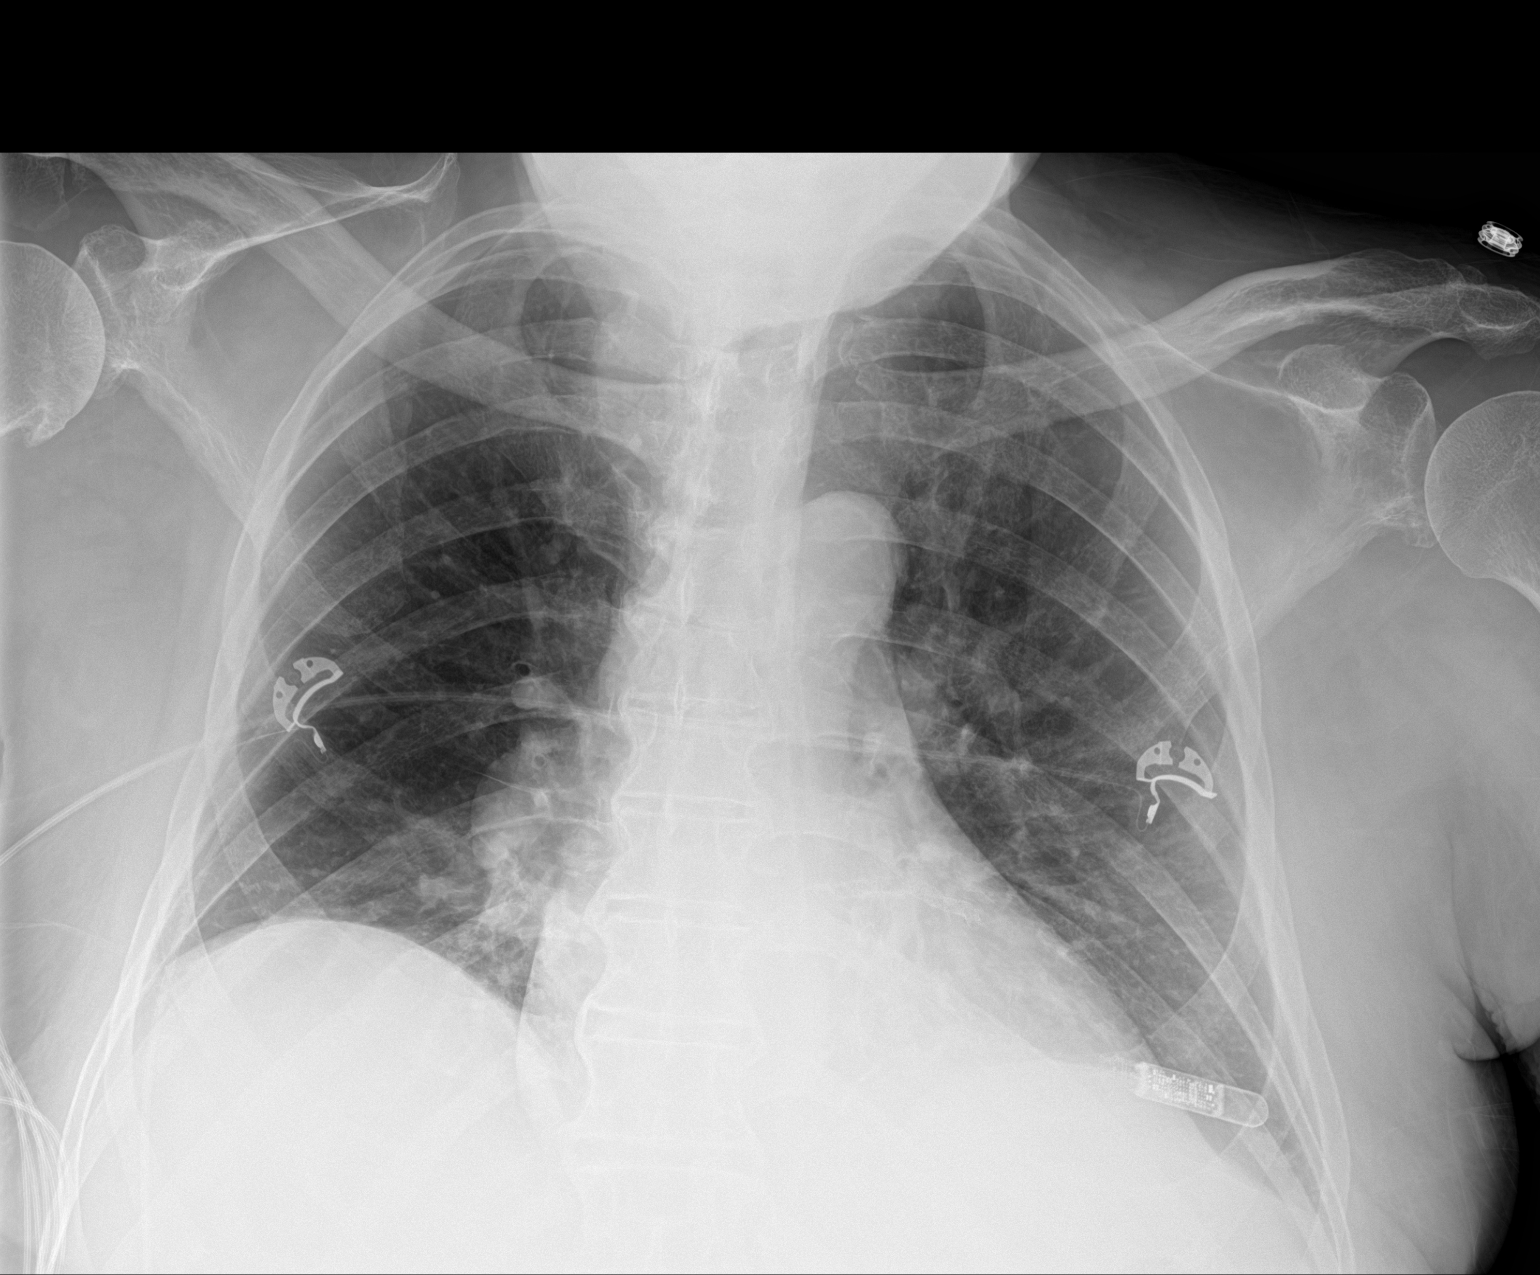

[1 of 1 positions shown; findings below may reference images not displayed]

FINDINGS: The heart is enlarged. There is minimal left basilar atelectasis.
The right lung is clear. There is no pleural effusion or
pneumothorax. Degenerative changes are seen in the spine. A cardiac
loop recorder overlies the left chest.
IMPRESSION: Minimal left basilar atelectasis.

## 2020-01-09 MED ORDER — RESOURCE THICKENUP CLEAR PO POWD
ORAL | Status: DC | PRN
  Filled 2020-01-09: qty 125

## 2020-01-09 MED ORDER — PANTOPRAZOLE SODIUM 40 MG PO TBEC
40.0000 mg | DELAYED_RELEASE_TABLET | Freq: Every day | ORAL | Status: DC
  Administered 2020-01-09 – 2020-01-10 (×2): 40 mg via ORAL
  Filled 2020-01-09 (×2): qty 1

## 2020-01-09 MED ORDER — GLUCOSE 40 % PO GEL
2.0000 | ORAL | Status: AC
  Administered 2020-01-09: 75 g via ORAL
  Filled 2020-01-09: qty 2

## 2020-01-09 MED ORDER — INSULIN GLARGINE 100 UNIT/ML ~~LOC~~ SOLN
20.0000 [IU] | Freq: Every day | SUBCUTANEOUS | Status: DC
  Administered 2020-01-10: 20 [IU] via SUBCUTANEOUS
  Filled 2020-01-09: qty 0.2

## 2020-01-09 MED ORDER — SODIUM CHLORIDE 0.9% IV SOLUTION: Freq: Once | INTRAVENOUS | Status: AC

## 2020-01-09 MED ORDER — FUROSEMIDE 10 MG/ML IJ SOLN
20.0000 mg | Freq: Once | INTRAMUSCULAR | Status: AC
  Administered 2020-01-09: 20 mg via INTRAVENOUS
  Filled 2020-01-09: qty 4

## 2020-01-09 NOTE — Progress Notes (Signed)
STROKE TEAM PROGRESS NOTE   INTERVAL HISTORY No family is at the bedside. Pt lying in bed, awake alert, he said he has back pain, and his position in bed makes his back pain worse. That is why he did not do well with PT/OT. He is deconditioned. He had low glucose this am and down to 45. Will change his lantus to day dose instead of bedtime dose. GI on board and recommend no procedures. Discussed with daughter over the phone and plan for PRBC 1 unit transfusion for low hb and positive FOBT. Intermittent coughing per RN and daughter, however, no coughing during my encounter, will check CXR. So far afebile and no leukocytosis.   OBJECTIVE Vitals:   01/08/20 1713 01/08/20 1957 01/08/20 2326 01/09/20 0217  BP: (!) 191/54 (!) 175/53 (!) 173/49 (!) 164/52  Pulse: 66 (!) 57 63 61  Resp: 18 20 16 17   Temp: 97.9 F (36.6 C) 98.6 F (37 C) 98.4 F (36.9 C) 98.4 F (36.9 C)  TempSrc: Oral Oral Axillary Axillary  SpO2: 99% 98% 97% 99%  Weight:      Height:       CBC:  Recent Labs  Lab 01/04/20 0249 01/04/20 0249 01/08/20 0313 01/09/20 0252  WBC 9.3   < > 8.7 8.1  NEUTROABS 6.9  --   --   --   HGB 8.2*   < > 7.5* 7.5*  HCT 25.5*   < > 24.5* 23.9*  MCV 82.8   < > 84.2 83.0  PLT 255   < > 253 261   < > = values in this interval not displayed.   Basic Metabolic Panel:  Recent Labs  Lab 01/08/20 0313 01/09/20 0252  NA 139 139  K 3.9 3.9  CL 107 106  CO2 22 22  GLUCOSE 118* 96  BUN 33* 30*  CREATININE 1.34* 1.23  CALCIUM 8.2* 8.2*   Lipid Panel:     Component Value Date/Time   CHOL 85 12/31/2019 0318   TRIG 152 (H) 12/31/2019 0318   HDL 44 12/31/2019 0318   CHOLHDL 1.9 12/31/2019 0318   VLDL 30 12/31/2019 0318   LDLCALC 11 12/31/2019 0318   HgbA1c:  Lab Results  Component Value Date   HGBA1C 6.0 (H) 12/31/2019   Urine Drug Screen:     Component Value Date/Time   LABOPIA NONE DETECTED 12/31/2019 1806   COCAINSCRNUR NONE DETECTED 12/31/2019 1806   LABBENZ NONE  DETECTED 12/31/2019 1806   AMPHETMU NONE DETECTED 12/31/2019 1806   THCU NONE DETECTED 12/31/2019 1806   LABBARB NONE DETECTED 12/31/2019 1806    Alcohol Level     Component Value Date/Time   ETH <10 12/30/2019 2315    IMAGING  CT Code Stroke CTA Head W/WO contrast CT Code Stroke CTA Neck W/WO contrast 12/31/2019 IMPRESSION:  1. Negative CTA for emergent large vessel occlusion.  2. Approximate 8-9 mm severe near occlusive stenosis of the proximal right M1 segment. Right MCA branches attenuated but patent distally with no visible downstream occlusion.  3. Additional severe proximal right A1 stenosis.  4. Atheromatous change about the carotid bifurcations with associated stenoses of up to 50% on the right and 45% on the left.  5. Atheromatous change about the right carotid siphon with associated moderate to severe multifocal stenoses.  6. Small amount of secretions within the proximal right mainstem bronchus, suggesting that this patient is at risk for aspiration.   MR BRAIN WO CONTRAST 12/31/2019 IMPRESSION:  1. Patchy and  hazy diffusion abnormality involving the right insular cortex, subinsular white matter, and overlying right frontal operculum, consistent with acute right MCA territory ischemia. No associated hemorrhage or mass effect. Finding is consistent with the previously identified severe right M1 stenosis.  2. No other acute intracranial abnormality.  3. Age-related cerebral atrophy with mild chronic small vessel ischemic disease.   CT HEAD CODE STROKE WO CONTRAST 12/30/2019 IMPRESSION:  1. No acute intracranial infarct or other abnormality.  2. ASPECTS is 10.  3. Age-appropriate cerebral atrophy with mild chronic small vessel ischemic disease.   CT HEAD WO CONTRAST - post tPA 01/01/20 IMPRESSION: 1. Patchy evolving small volume acute ischemic infarcts involving the right subinsular white matter, adjacent insular cortex, and overlying operculum, corresponding with  findings on prior MRI. No interval hemorrhage, mass effect, or other complication. 2. No other new acute intracranial abnormality.  CT HEAD WO CONTRAST  01/06/20 IMPRESSION:  Low-density now evident in the deep insular cortex and underlying white matter tracks on the right consistent with acute infarction in that location shown by recent MRI.   More widespread right insular cortex shows some loss of gray-white differentiation but no pronounced swelling.   No evidence of regional hemorrhage.  DG Chest 2 View 12/30/2019 IMPRESSION:  1. Stable cardiomegaly. No pulmonary venous congestion.  2. Low lung volumes.   Transthoracic Echocardiogram  1. Left ventricular ejection fraction, by estimation, is 60 to 65%. The left ventricle has normal function. The left ventricle has no regional wall motion abnormalities. There is mild concentric left ventricular hypertrophy. Left ventricular diastolic parameters are consistent with Grade II diastolic dysfunction (pseudonormalization). Elevated left atrial pressure. The E/e' is 32.  2. Right ventricular systolic function is normal. The right ventricular size is normal. Tricuspid regurgitation signal is inadequate for assessing PA pressure.  3. Left atrial size was mildly dilated.  4. The mitral valve is degenerative. No evidence of mitral valve regurgitation. No evidence of mitral stenosis.  5. The aortic valve is tricuspid. Aortic valve regurgitation is not visualized. No aortic stenosis is present.  6. The inferior vena cava is normal in size with greater than 50% respiratory variability, suggesting right atrial pressure of 3 mmHg.  ECG - SR rate 70 BPM. (See cardiology reading for complete details)   PHYSICAL EXAM    General - Well nourished, well developed elderly male, in no apparent distress.  Ophthalmologic - fundi not visualized due to noncooperation.  Cardiovascular - Regular rhythm and rate.  Mental Status -  Level of arousal and  orientation to time, place, and person were intact.  There is minimal dysarthria noted. Language including expression, naming, repetition, comprehension was assessed and found intact.  Cranial Nerves II - XII - II - Visual field intact OU. III, IV, VI - Extraocular movements intact. V - Facial sensation intact bilaterally. VII - mild left facial droop. VIII - Hearing & vestibular intact bilaterally. X - Palate elevates symmetrically. XI - Chin turning & shoulder shrug intact bilaterally. XII - Tongue protrusion intact.  Motor Strength - The patient's strength was normal in all extremities except mild left UE pronator drift.  There is clawhand deformity of the hands bilaterally more profound on the right side Likely from prior ulnar nerve damage at the elbow.  There is marked global atrophy of the intrinsic hand muscles on the right and mild on the left.  These are associated with weakness of the hand muscles bilaterally. Motor Tone - Muscle tone was assessed at the neck and  was normal.  Reflexes - The patient's reflexes were symmetrical in all extremities and he had no pathological reflexes.  Sensory - Light touch, temperature/pinprick were assessed and were symmetrical.    Coordination - The patient had normal movements in the hands with no ataxia or dysmetria on right side. Mild left sided ataxia.    Gait and Station - deferred.   ASSESSMENT/PLAN [name] is a 86 y.o. male with history of coronary artery disease, CKD stage III, dementia, diabetes, diastolic dysfunction, peripheral vascular disease, prostate cancer, history of multiple syncopal episodes, presented to the emergency room for evaluation of sudden onset of slurred speech, difficulty swallowing and left-sided weakness. He was scheduled for a GI scoping today, but could not go ahead with the procedure because of severely elevated blood pressures with systolic in the 263Z.  His Plavix was on hold for procedure. The  patient received IV t-PA Friday 12/31/19 at [place].  Stroke: acute right MCA pachy infarcts with right M1 occlusion s/p tPA - cardioembolic vs. Large vessel disease source  CT Head - No acute intracranial infarct or other abnormality. ASPECTS is 10.   CTA H&N - Approximate 8-9 mm severe near occlusive stenosis of the proximal right M1 segment. Right MCA branches attenuated but patent distally with no visible downstream occlusion. Additional severe proximal right A1 stenosis. ICA stenosis up to 50% on the right and 45% on the left. Atheromatous change about the right carotid siphon with associated moderate to severe multifocal stenoses.  MRI head - Patchy and hazy diffusion abnormality involving the right insular cortex, subinsular white matter, and overlying right frontal operculum, consistent with acute right MCA territory ischemia. No associated hemorrhage or mass effect. Finding is consistent with the previously identified severe right M1 stenosis.   CT head - 11/20 and 11/25 - Patchy evolving small volume acute ischemic infarcts involving the right subinsular white matter, adjacent insular cortex, and overlying operculum, corresponding with findings on prior MRI. No interval hemorrhage  2D Echo - EF 60 - 65%. No cardiac source of emboli identified.   Consider 30 day cardiac event monitoring as outpt to rule out afib  [employer] Virus 2 - negative  LDL - 11  HgbA1c - 6.0  UDS neg  VTE prophylaxis - SCDs  aspirin 81 mg daily and clopidogrel 75 mg daily (Plavix held 5 days for colonoscopy) prior to admission, now on Aspirin 81 mg daily and brillinta. Per Dr. Leonie Man, continue DAPT for 3 months, then brillinta alone.    Therapy recommendations:  CIR  Disposition:  Pending  CAD s/p stent Elevated troponin  Troponin 35->50->215>209  EKG no ST-T change  TTE - EF 60 - 65%. No cardiac source of emboli identified.   Cardiology on board - no work up planned  On DAPT now, continue on  discharge  Hypertensive Urgency Bradycardia on BB  Home BP meds: Cozaar ; amlodipine ; HCTZ ; Coreg ; hydralazine ; Cardura  BP remains up this am - po meds again increased  Off cleviprex - now on Coreg 6.25mg  bid; Norvasc 10mg  daily ; imdur 60mg  daily; Cardura 4mg  daily ; and hydralazine 100mg  Tid  Avoid labetalol if HR down, discussed with RN  Cardiology Dr. Percival Spanish is on board . Long-term BP goal normotensive  Hyperlipidemia  Home Lipid lowering medication: Lipitor 80 mg daily  LDL 11, goal < 70  Current lipid lowering medication: decrease to lipitor 40   Continue statin at discharge  Diabetes hypoglycemia  Home diabetic  meds: insulin  Current diabetic meds: SSI   HgbA1c 6.0, goal < 7.0  Controlled  CBG monitoring  Follow up with PCP  Hypoglycemia 45 this am with trend of low glucose in the morning. Change Lantus Qhs to Qday.  Anemia of chronic disease   Hgb - 8.2->7.5->7.5  FOBT  positive   Increase iron pill to bid  Iron and ferritin level pending  On PPI daily  11/18 GI admission for EGD/colonoscopy 2/2 anemia and diarrhea  Appreciate GI consult from Dr Paulita Fujita. No procedure recommended - OK to continue DAPT  CBC monitoring  Will consider 1U PRBC transfusion  Dysphagia  Aspiration risk  Speech on board   Passed MBS  On regular and nectar thick  Off IVF  Afebrile and no leukocytosis  CXR pending   Other Stroke Risk Factors  Advanced age  Former cigarette smoker - quit  ETOH use, advised to drink no more than 1 alcoholic beverage per day.  Other Active Problems  Code status - DNR   Dementia on Aricept and nemanda - continue   Hx of multiple syncopal episode  CKD - stage 3a - Cre - 1.30->1.47->1.69->2.30->1.86->1.52->1.34  Necrotic R foot 2nd toe w/o planned amputation per podiatry, [facility] 12/2019. Dsg changes daily.  Chronic diarrhea - C-diff quick screen ordered 01/01/20 - negatvie - stopped enteric isolation. GI  consulted, seems diarrhea improving, no procedure recommended  Deconditioning   Chronic back pain - bed position makes pt back hurts  Hospital day # 9  I had long discussion with daughter over the phone, updated pt current condition, treatment plan and potential prognosis, and answered all the questions. She expressed understanding and appreciation. I spent  35 minutes in total face-to-face time with the patient, more than 50% of which was spent in counseling and coordination of care, reviewing test results, images and medication, and discussing the diagnosis, treatment plan and potential prognosis. This patient's care requiresreview of multiple databases, neurological assessment, discussion with family, other specialists and medical decision making of high complexity.  Rosalin Hawking, MD PhD Stroke Neurology 01/09/2020 4:26 PM    To contact Stroke Continuity provider, please refer to [email or web address] After hours, contact General Neurology
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
## 2020-01-09 NOTE — Progress Notes (Signed)
Episode of hypoglycemia of 45, administered Glutose as per standing orders, pt asymptomatic, able to follow commands, converse and answer questions, on call MD made aware through page, will continue to monitor

## 2020-01-09 NOTE — Progress Notes (Signed)
Progress Note  Patient Name: Peter [name] Date of Encounter: 01/09/2020  Primary Cardiologist: [name], MD   Subjective   He says he is still having some coughing and very weak.  No new pain or SOB.   Inpatient Medications    Scheduled Meds: . allopurinol  100 mg Oral Daily  . amLODipine  10 mg Oral Daily  . aspirin EC  81 mg Oral Daily  . atorvastatin  40 mg Oral Daily  . carvedilol  6.25 mg Oral BID WC  . Chlorhexidine Gluconate Cloth  6 each Topical Daily  . docusate sodium  100 mg Oral Daily  . donepezil  10 mg Oral Daily  . doxazosin  4 mg Oral Daily  . DULoxetine  30 mg Oral Daily  . ferrous sulfate  325 mg Oral BID WC  . finasteride  5 mg Oral Daily  . gabapentin  300 mg Oral BID  . hydrALAZINE  100 mg Oral TID  . hydrochlorothiazide  12.5 mg Oral Daily  . insulin aspart  0-15 Units Subcutaneous TID WC  . insulin aspart  0-5 Units Subcutaneous QHS  . insulin glargine  20 Units Subcutaneous QHS  . isosorbide mononitrate  60 mg Oral Daily  . memantine  10 mg Oral BID  . multivitamin with minerals  1 tablet Oral Daily  . ticagrelor  90 mg Oral BID   Continuous Infusions:  PRN Meds: acetaminophen **OR** acetaminophen (TYLENOL) oral liquid 160 mg/5 mL **OR** acetaminophen, guaiFENesin-dextromethorphan, labetalol, Resource ThickenUp Clear, senna-docusate   Vital Signs    Vitals:   01/08/20 1713 01/08/20 1957 01/08/20 2326 01/09/20 0217  BP: (!) 191/54 (!) 175/53 (!) 173/49 (!) 164/52  Pulse: 66 (!) 57 63 61  Resp: 18 20 16 17   Temp: 97.9 F (36.6 C) 98.6 F (37 C) 98.4 F (36.9 C) 98.4 F (36.9 C)  TempSrc: Oral Oral Axillary Axillary  SpO2: 99% 98% 97% 99%  Weight:      Height:        Intake/Output Summary (Last 24 hours) at 01/09/2020 0749 Last data filed at 01/08/2020 1021 Gross per 24 hour  Intake 150 ml  Output --  Net 150 ml   Filed Weights   01/06/20 0840  Weight: 72.5 kg    Physical Exam   GEN: No  acute distress.    Neck: No  JVD Cardiac: RRR, no murmurs, rubs, or gallops.  Respiratory:    Improved breath sounds at the bases with few coarse crackles.   GI: Soft, nontender, non-distended, normal bowel sounds  MS:  Trace edema; No deformity. Neuro:   Nonfocal  Psych: Oriented and appropriate     Labs    Chemistry Recent Labs  Lab 01/05/20 1145 01/08/20 0313 01/09/20 0252  NA 137 139 139  K 4.0 3.9 3.9  CL 109 107 106  CO2 20* 22 22  GLUCOSE 241* 118* 96  BUN 37* 33* 30*  CREATININE 1.52* 1.34* 1.23  CALCIUM 7.8* 8.2* 8.2*  PROT  --  5.0*  --   ALBUMIN  --  2.4*  --   AST  --  28  --   ALT  --  39  --   ALKPHOS  --  60  --   BILITOT  --  0.5  --   GFRNONAA 44* 51* 57*  ANIONGAP 8 10 11      Hematology Recent Labs  Lab 01/04/20 0249 01/08/20 0313 01/09/20 0252  WBC 9.3 8.7 8.1  RBC 3.08* 2.91* 2.88*  HGB 8.2* 7.5* 7.5*  HCT 25.5* 24.5* 23.9*  MCV 82.8 84.2 83.0  MCH 26.6 25.8* 26.0  MCHC 32.2 30.6 31.4  RDW 17.9* 18.3* 18.0*  PLT 255 253 261    Cardiac EnzymesNo results for input(s): TROPONINI in the last 168 hours. No results for input(s): TROPIPOC in the last 168 hours.   BNPNo results for input(s): BNP, PROBNP in the last 168 hours.   DDimer No results for input(s): DDIMER in the last 168 hours.   Radiology    No results found.  Telemetry    NSR, SB - Personally Reviewed  ECG    No new tracing as of 01/05/20- Personally Reviewed  Cardiac Studies   Echo 12/31/19: 1. Left ventricular ejection fraction, by estimation, is 60 to 65%. The  left ventricle has normal function. The left ventricle has no regional  wall motion abnormalities. There is mild concentric left ventricular  hypertrophy. Left ventricular diastolic  parameters are consistent with Grade II diastolic dysfunction  (pseudonormalization). Elevated left atrial pressure. The E/e' is 19.  2. Right ventricular systolic function is normal. The right ventricular  size is normal. Tricuspid  regurgitation signal is inadequate for assessing  PA pressure.  3. Left atrial size was mildly dilated.  4. The mitral valve is degenerative. No evidence of mitral valve  regurgitation. No evidence of mitral stenosis.  5. The aortic valve is tricuspid. Aortic valve regurgitation is not  visualized. No aortic stenosis is present.  6. The inferior vena cava is normal in size with greater than 50%  respiratory variability, suggesting right atrial pressure of 3 mmHg.    Coronary stent intervention 07/05/19: Conclusions: 1. Severely diseased proximal and mid LAD with multifocal stenoses of up to 80-90% and heavy calcification. 2. Successful IVUS-guided orbital atherectomy and PCI of the ostial through mid LAD using a Synergy 3.0 x 48 mm drug-eluting stent (postdilated up to 3.9 mm) with less than 10% residual stenosis and TIMI-3 flow.  Recommendations: 1. Continue dual antiplatelet therapy with aspirin and clopidogrel for at least 12 months. 2. Titrate nitroglycerin for relief of chest pain that occurred during atherectomy and balloon inflation as well as for improved blood pressure control. 3. Remove left femoral artery sheath with manual compression once ACT has fallen below 175 seconds. 4. Aggressive secondary prevention.  Patient Profile     86 y.o. male with a history of peripheral arterial disease with multiple prior amputations, CAD s/p RCA and LAD PCI, T2Dm, HTN, CKD3, and prostatic adenocarcinoma admitted s/p TPA in setting of code stroke.  Assessment & Plan    1. Elevated troponin/hypertensive urgency: Demand ischemia.  No ischemia work up is planned.   2. Acute CVA Permissive HTN given increased symptoms when hs SBP went below 130.    3. CAD On DAPT.  He has had worsening anemia and guaiac positive stools.  However, he has had labile neurologic symptoms. Defer to neurology and primary team but he would be high risk off DAPT.   4. Hypertension BP has crept back up  to the 160s - 170s.  However, his hydralazine dose was increased twice yesterday.  I would suggest observing today without change if SBP remains 170s or below.   5. Hyperlipidemia: On Lipitor 40 mg daily.   6. CKD stage III: Creatinine back to baseline.   7.  Cough: Ordered incentive spirometer.  He has done well with this and, despite continued cough, he has improved lower lung sounds.  Signed, Minus Breeding  For questions or updates, please contact   Please consult [email or web address] for contact info under Cardiology/STEMI.

## 2020-01-10 ENCOUNTER — Other Ambulatory Visit: Payer: Self-pay

## 2020-01-10 ENCOUNTER — Inpatient Hospital Stay (HOSPITAL_COMMUNITY)
Admission: RE | Admit: 2020-01-10 | Discharge: 2020-02-04 | DRG: 056 | Disposition: A | Discharge status: Hospice | Payer: Medicare Other | Source: Intra-hospital | Attending: Physical Medicine & Rehabilitation | Admitting: Physical Medicine & Rehabilitation

## 2020-01-10 ENCOUNTER — Encounter (HOSPITAL_COMMUNITY): Payer: Self-pay | Admitting: Physical Medicine & Rehabilitation

## 2020-01-10 DIAGNOSIS — R001 Bradycardia, unspecified: Secondary | ICD-10-CM | POA: Diagnosis not present

## 2020-01-10 DIAGNOSIS — Z7982 Long term (current) use of aspirin: Secondary | ICD-10-CM

## 2020-01-10 DIAGNOSIS — I252 Old myocardial infarction: Secondary | ICD-10-CM

## 2020-01-10 DIAGNOSIS — E1152 Type 2 diabetes mellitus with diabetic peripheral angiopathy with gangrene: Secondary | ICD-10-CM | POA: Diagnosis present

## 2020-01-10 DIAGNOSIS — D649 Anemia, unspecified: Secondary | ICD-10-CM

## 2020-01-10 DIAGNOSIS — D631 Anemia in chronic kidney disease: Secondary | ICD-10-CM | POA: Diagnosis present

## 2020-01-10 DIAGNOSIS — I63511 Cerebral infarction due to unspecified occlusion or stenosis of right middle cerebral artery: Secondary | ICD-10-CM | POA: Diagnosis not present

## 2020-01-10 DIAGNOSIS — E1151 Type 2 diabetes mellitus with diabetic peripheral angiopathy without gangrene: Secondary | ICD-10-CM | POA: Diagnosis present

## 2020-01-10 DIAGNOSIS — J189 Pneumonia, unspecified organism: Secondary | ICD-10-CM | POA: Diagnosis not present

## 2020-01-10 DIAGNOSIS — I672 Cerebral atherosclerosis: Secondary | ICD-10-CM | POA: Diagnosis not present

## 2020-01-10 DIAGNOSIS — I70263 Atherosclerosis of native arteries of extremities with gangrene, bilateral legs: Secondary | ICD-10-CM | POA: Diagnosis not present

## 2020-01-10 DIAGNOSIS — E1165 Type 2 diabetes mellitus with hyperglycemia: Secondary | ICD-10-CM | POA: Diagnosis not present

## 2020-01-10 DIAGNOSIS — Z8546 Personal history of malignant neoplasm of prostate: Secondary | ICD-10-CM | POA: Diagnosis not present

## 2020-01-10 DIAGNOSIS — R1312 Dysphagia, oropharyngeal phase: Secondary | ICD-10-CM | POA: Diagnosis present

## 2020-01-10 DIAGNOSIS — N1832 Chronic kidney disease, stage 3b: Secondary | ICD-10-CM | POA: Diagnosis not present

## 2020-01-10 DIAGNOSIS — E876 Hypokalemia: Secondary | ICD-10-CM

## 2020-01-10 DIAGNOSIS — G319 Degenerative disease of nervous system, unspecified: Secondary | ICD-10-CM | POA: Diagnosis not present

## 2020-01-10 DIAGNOSIS — L89152 Pressure ulcer of sacral region, stage 2: Secondary | ICD-10-CM | POA: Diagnosis not present

## 2020-01-10 DIAGNOSIS — Z7901 Long term (current) use of anticoagulants: Secondary | ICD-10-CM

## 2020-01-10 DIAGNOSIS — G63 Polyneuropathy in diseases classified elsewhere: Secondary | ICD-10-CM

## 2020-01-10 DIAGNOSIS — N390 Urinary tract infection, site not specified: Secondary | ICD-10-CM | POA: Diagnosis not present

## 2020-01-10 DIAGNOSIS — I2510 Atherosclerotic heart disease of native coronary artery without angina pectoris: Secondary | ICD-10-CM | POA: Diagnosis present

## 2020-01-10 DIAGNOSIS — D72829 Elevated white blood cell count, unspecified: Secondary | ICD-10-CM | POA: Diagnosis not present

## 2020-01-10 DIAGNOSIS — Z880 Allergy status to penicillin: Secondary | ICD-10-CM

## 2020-01-10 DIAGNOSIS — Z7983 Long term (current) use of bisphosphonates: Secondary | ICD-10-CM

## 2020-01-10 DIAGNOSIS — Z6824 Body mass index (BMI) 24.0-24.9, adult: Secondary | ICD-10-CM

## 2020-01-10 DIAGNOSIS — R7309 Other abnormal glucose: Secondary | ICD-10-CM | POA: Diagnosis not present

## 2020-01-10 DIAGNOSIS — R509 Fever, unspecified: Secondary | ICD-10-CM

## 2020-01-10 DIAGNOSIS — R159 Full incontinence of feces: Secondary | ICD-10-CM | POA: Diagnosis present

## 2020-01-10 DIAGNOSIS — E43 Unspecified severe protein-calorie malnutrition: Secondary | ICD-10-CM | POA: Diagnosis not present

## 2020-01-10 DIAGNOSIS — B961 Klebsiella pneumoniae [K. pneumoniae] as the cause of diseases classified elsewhere: Secondary | ICD-10-CM | POA: Diagnosis present

## 2020-01-10 DIAGNOSIS — G8929 Other chronic pain: Secondary | ICD-10-CM | POA: Diagnosis present

## 2020-01-10 DIAGNOSIS — I130 Hypertensive heart and chronic kidney disease with heart failure and stage 1 through stage 4 chronic kidney disease, or unspecified chronic kidney disease: Secondary | ICD-10-CM | POA: Diagnosis present

## 2020-01-10 DIAGNOSIS — I6782 Cerebral ischemia: Secondary | ICD-10-CM | POA: Diagnosis not present

## 2020-01-10 DIAGNOSIS — Z79899 Other long term (current) drug therapy: Secondary | ICD-10-CM | POA: Diagnosis not present

## 2020-01-10 DIAGNOSIS — E1122 Type 2 diabetes mellitus with diabetic chronic kidney disease: Secondary | ICD-10-CM | POA: Diagnosis present

## 2020-01-10 DIAGNOSIS — I69391 Dysphagia following cerebral infarction: Secondary | ICD-10-CM | POA: Diagnosis not present

## 2020-01-10 DIAGNOSIS — I639 Cerebral infarction, unspecified: Secondary | ICD-10-CM | POA: Diagnosis not present

## 2020-01-10 DIAGNOSIS — N1831 Chronic kidney disease, stage 3a: Secondary | ICD-10-CM | POA: Diagnosis not present

## 2020-01-10 DIAGNOSIS — Z713 Dietary counseling and surveillance: Secondary | ICD-10-CM

## 2020-01-10 DIAGNOSIS — I69392 Facial weakness following cerebral infarction: Secondary | ICD-10-CM

## 2020-01-10 DIAGNOSIS — E119 Type 2 diabetes mellitus without complications: Secondary | ICD-10-CM | POA: Diagnosis not present

## 2020-01-10 DIAGNOSIS — E785 Hyperlipidemia, unspecified: Secondary | ICD-10-CM | POA: Diagnosis present

## 2020-01-10 DIAGNOSIS — J9811 Atelectasis: Secondary | ICD-10-CM | POA: Diagnosis not present

## 2020-01-10 DIAGNOSIS — K219 Gastro-esophageal reflux disease without esophagitis: Secondary | ICD-10-CM | POA: Diagnosis present

## 2020-01-10 DIAGNOSIS — M549 Dorsalgia, unspecified: Secondary | ICD-10-CM | POA: Diagnosis present

## 2020-01-10 DIAGNOSIS — I69322 Dysarthria following cerebral infarction: Secondary | ICD-10-CM | POA: Diagnosis not present

## 2020-01-10 DIAGNOSIS — H547 Unspecified visual loss: Secondary | ICD-10-CM | POA: Diagnosis present

## 2020-01-10 DIAGNOSIS — I69354 Hemiplegia and hemiparesis following cerebral infarction affecting left non-dominant side: Principal | ICD-10-CM

## 2020-01-10 DIAGNOSIS — I6523 Occlusion and stenosis of bilateral carotid arteries: Secondary | ICD-10-CM | POA: Diagnosis present

## 2020-01-10 DIAGNOSIS — I10 Essential (primary) hypertension: Secondary | ICD-10-CM

## 2020-01-10 DIAGNOSIS — F419 Anxiety disorder, unspecified: Secondary | ICD-10-CM | POA: Diagnosis present

## 2020-01-10 DIAGNOSIS — D62 Acute posthemorrhagic anemia: Secondary | ICD-10-CM | POA: Diagnosis not present

## 2020-01-10 DIAGNOSIS — G4700 Insomnia, unspecified: Secondary | ICD-10-CM | POA: Diagnosis present

## 2020-01-10 DIAGNOSIS — Z833 Family history of diabetes mellitus: Secondary | ICD-10-CM

## 2020-01-10 DIAGNOSIS — I739 Peripheral vascular disease, unspecified: Secondary | ICD-10-CM | POA: Diagnosis not present

## 2020-01-10 DIAGNOSIS — R4182 Altered mental status, unspecified: Secondary | ICD-10-CM | POA: Diagnosis not present

## 2020-01-10 DIAGNOSIS — M2550 Pain in unspecified joint: Secondary | ICD-10-CM | POA: Diagnosis not present

## 2020-01-10 DIAGNOSIS — J690 Pneumonitis due to inhalation of food and vomit: Secondary | ICD-10-CM | POA: Diagnosis not present

## 2020-01-10 DIAGNOSIS — Z794 Long term (current) use of insulin: Secondary | ICD-10-CM | POA: Diagnosis not present

## 2020-01-10 DIAGNOSIS — M21512 Acquired clawhand, left hand: Secondary | ICD-10-CM | POA: Diagnosis present

## 2020-01-10 DIAGNOSIS — A499 Bacterial infection, unspecified: Secondary | ICD-10-CM | POA: Diagnosis not present

## 2020-01-10 DIAGNOSIS — R7989 Other specified abnormal findings of blood chemistry: Secondary | ICD-10-CM | POA: Diagnosis not present

## 2020-01-10 DIAGNOSIS — I6381 Other cerebral infarction due to occlusion or stenosis of small artery: Secondary | ICD-10-CM | POA: Diagnosis not present

## 2020-01-10 DIAGNOSIS — D500 Iron deficiency anemia secondary to blood loss (chronic): Secondary | ICD-10-CM

## 2020-01-10 DIAGNOSIS — Z7401 Bed confinement status: Secondary | ICD-10-CM | POA: Diagnosis not present

## 2020-01-10 DIAGNOSIS — Z8249 Family history of ischemic heart disease and other diseases of the circulatory system: Secondary | ICD-10-CM

## 2020-01-10 DIAGNOSIS — M21511 Acquired clawhand, right hand: Secondary | ICD-10-CM | POA: Diagnosis present

## 2020-01-10 DIAGNOSIS — M79671 Pain in right foot: Secondary | ICD-10-CM | POA: Diagnosis not present

## 2020-01-10 DIAGNOSIS — E11649 Type 2 diabetes mellitus with hypoglycemia without coma: Secondary | ICD-10-CM | POA: Diagnosis not present

## 2020-01-10 DIAGNOSIS — M109 Gout, unspecified: Secondary | ICD-10-CM | POA: Diagnosis present

## 2020-01-10 DIAGNOSIS — I160 Hypertensive urgency: Secondary | ICD-10-CM | POA: Diagnosis present

## 2020-01-10 DIAGNOSIS — J90 Pleural effusion, not elsewhere classified: Secondary | ICD-10-CM | POA: Diagnosis not present

## 2020-01-10 DIAGNOSIS — I5032 Chronic diastolic (congestive) heart failure: Secondary | ICD-10-CM

## 2020-01-10 DIAGNOSIS — Z955 Presence of coronary angioplasty implant and graft: Secondary | ICD-10-CM

## 2020-01-10 DIAGNOSIS — I6621 Occlusion and stenosis of right posterior cerebral artery: Secondary | ICD-10-CM | POA: Diagnosis not present

## 2020-01-10 DIAGNOSIS — J811 Chronic pulmonary edema: Secondary | ICD-10-CM | POA: Diagnosis not present

## 2020-01-10 DIAGNOSIS — R059 Cough, unspecified: Secondary | ICD-10-CM | POA: Diagnosis not present

## 2020-01-10 DIAGNOSIS — Z87891 Personal history of nicotine dependence: Secondary | ICD-10-CM

## 2020-01-10 DIAGNOSIS — I69398 Other sequelae of cerebral infarction: Secondary | ICD-10-CM

## 2020-01-10 DIAGNOSIS — I6389 Other cerebral infarction: Secondary | ICD-10-CM | POA: Diagnosis not present

## 2020-01-10 DIAGNOSIS — E440 Moderate protein-calorie malnutrition: Secondary | ICD-10-CM | POA: Diagnosis not present

## 2020-01-10 DIAGNOSIS — F321 Major depressive disorder, single episode, moderate: Secondary | ICD-10-CM

## 2020-01-10 DIAGNOSIS — Z515 Encounter for palliative care: Secondary | ICD-10-CM

## 2020-01-10 DIAGNOSIS — F0390 Unspecified dementia without behavioral disturbance: Secondary | ICD-10-CM | POA: Diagnosis present

## 2020-01-10 DIAGNOSIS — I959 Hypotension, unspecified: Secondary | ICD-10-CM | POA: Diagnosis not present

## 2020-01-10 DIAGNOSIS — H748X3 Other specified disorders of middle ear and mastoid, bilateral: Secondary | ICD-10-CM | POA: Diagnosis not present

## 2020-01-10 LAB — BASIC METABOLIC PANEL
Anion gap: 13 (ref 5–15)
BUN: 28 mg/dL — ABNORMAL HIGH (ref 8–23)
CO2: 22 mmol/L (ref 22–32)
Calcium: 8.2 mg/dL — ABNORMAL LOW (ref 8.9–10.3)
Chloride: 103 mmol/L (ref 98–111)
Creatinine, Ser: 1.39 mg/dL — ABNORMAL HIGH (ref 0.61–1.24)
GFR, Estimated: 49 mL/min — ABNORMAL LOW (ref >60)
Glucose, Bld: 116 mg/dL — ABNORMAL HIGH (ref 70–99)
Potassium: 3.9 mmol/L (ref 3.5–5.1)
Sodium: 138 mmol/L (ref 135–145)

## 2020-01-10 LAB — IRON AND TIBC
Iron: 31 ug/dL — ABNORMAL LOW (ref 45–182)
Saturation Ratios: 16 % — ABNORMAL LOW (ref 17.9–39.5)
TIBC: 189 ug/dL — ABNORMAL LOW (ref 250–450)
UIBC: 158 ug/dL

## 2020-01-10 LAB — TYPE AND SCREEN
ABO/RH(D): O POS
Antibody Screen: NEGATIVE
Unit division: 0

## 2020-01-10 LAB — CBC
HCT: 26.4 % — ABNORMAL LOW (ref 39.0–52.0)
Hemoglobin: 8.4 g/dL — ABNORMAL LOW (ref 13.0–17.0)
MCH: 26.6 pg (ref 26.0–34.0)
MCHC: 31.8 g/dL (ref 30.0–36.0)
MCV: 83.5 fL (ref 80.0–100.0)
Platelets: 275 10*3/uL (ref 150–400)
RBC: 3.16 MIL/uL — ABNORMAL LOW (ref 4.22–5.81)
RDW: 17.1 % — ABNORMAL HIGH (ref 11.5–15.5)
WBC: 8.8 10*3/uL (ref 4.0–10.5)
nRBC: 0 % (ref 0.0–0.2)

## 2020-01-10 LAB — BPAM RBC
Blood Product Expiration Date: 202112282359
ISSUE DATE / TIME: 202111281806
Unit Type and Rh: 5100

## 2020-01-10 LAB — GLUCOSE, CAPILLARY
Glucose-Capillary: 121 mg/dL — ABNORMAL HIGH (ref 70–99)
Glucose-Capillary: 214 mg/dL — ABNORMAL HIGH (ref 70–99)
Glucose-Capillary: 115 mg/dL — ABNORMAL HIGH (ref 70–99)
Glucose-Capillary: 165 mg/dL — ABNORMAL HIGH (ref 70–99)

## 2020-01-10 LAB — FERRITIN: Ferritin: 286 ng/mL (ref 24–336)

## 2020-01-10 MED ORDER — MEMANTINE HCL 10 MG PO TABS
10.0000 mg | ORAL_TABLET | Freq: Two times a day (BID) | ORAL | Status: DC
  Administered 2020-01-10 – 2020-02-04 (×49): 10 mg via ORAL
  Filled 2020-01-10 (×3): qty 1
  Filled 2020-01-10 (×2): qty 2
  Filled 2020-01-10 (×6): qty 1
  Filled 2020-01-10 (×2): qty 2
  Filled 2020-01-10 (×2): qty 1
  Filled 2020-01-10 (×4): qty 2
  Filled 2020-01-10: qty 1
  Filled 2020-01-10: qty 2
  Filled 2020-01-10 (×6): qty 1
  Filled 2020-01-10: qty 2
  Filled 2020-01-10: qty 1
  Filled 2020-01-10: qty 2
  Filled 2020-01-10 (×8): qty 1
  Filled 2020-01-10: qty 2
  Filled 2020-01-10: qty 1
  Filled 2020-01-10: qty 2
  Filled 2020-01-10: qty 1
  Filled 2020-01-10: qty 2
  Filled 2020-01-10 (×7): qty 1
  Filled 2020-01-10: qty 2

## 2020-01-10 MED ORDER — PANTOPRAZOLE SODIUM 40 MG PO TBEC: 40.0000 mg | DELAYED_RELEASE_TABLET | Freq: Every day | ORAL | Status: DC

## 2020-01-10 MED ORDER — HYDRALAZINE HCL 50 MG PO TABS
100.0000 mg | ORAL_TABLET | Freq: Three times a day (TID) | ORAL | Status: DC
  Administered 2020-01-10 – 2020-02-04 (×65): 100 mg via ORAL
  Filled 2020-01-10 (×73): qty 2

## 2020-01-10 MED ORDER — SORBITOL 70 % SOLN
30.0000 mL | Freq: Every day | Status: DC | PRN
  Administered 2020-01-16 – 2020-01-30 (×2): 30 mL via ORAL
  Filled 2020-01-10 (×2): qty 30

## 2020-01-10 MED ORDER — AMLODIPINE BESYLATE 10 MG PO TABS
10.0000 mg | ORAL_TABLET | Freq: Every day | ORAL | Status: DC
  Administered 2020-01-11 – 2020-02-04 (×25): 10 mg via ORAL
  Filled 2020-01-10 (×25): qty 1

## 2020-01-10 MED ORDER — HYDROCHLOROTHIAZIDE 12.5 MG PO CAPS: 12.5000 mg | ORAL_CAPSULE | Freq: Every day | ORAL | Status: DC

## 2020-01-10 MED ORDER — GABAPENTIN 300 MG PO CAPS
300.0000 mg | ORAL_CAPSULE | Freq: Two times a day (BID) | ORAL | Status: DC
  Administered 2020-01-10 – 2020-02-01 (×43): 300 mg via ORAL
  Filled 2020-01-10 (×44): qty 1

## 2020-01-10 MED ORDER — PANTOPRAZOLE SODIUM 40 MG PO TBEC
40.0000 mg | DELAYED_RELEASE_TABLET | Freq: Every day | ORAL | Status: DC
  Administered 2020-01-11 – 2020-02-04 (×25): 40 mg via ORAL
  Filled 2020-01-10 (×24): qty 1

## 2020-01-10 MED ORDER — INSULIN GLARGINE 100 UNIT/ML ~~LOC~~ SOLN: 20.0000 [IU] | Freq: Every day | SUBCUTANEOUS | 11 refills | Status: DC

## 2020-01-10 MED ORDER — TICAGRELOR 90 MG PO TABS
90.0000 mg | ORAL_TABLET | Freq: Two times a day (BID) | ORAL | Status: DC
  Administered 2020-01-10 – 2020-02-04 (×50): 90 mg via ORAL
  Filled 2020-01-10 (×51): qty 1

## 2020-01-10 MED ORDER — DULOXETINE HCL 30 MG PO CPEP
30.0000 mg | ORAL_CAPSULE | Freq: Every day | ORAL | Status: DC
  Administered 2020-01-11 – 2020-02-04 (×25): 30 mg via ORAL
  Filled 2020-01-10 (×25): qty 1

## 2020-01-10 MED ORDER — CARVEDILOL 6.25 MG PO TABS
6.2500 mg | ORAL_TABLET | Freq: Two times a day (BID) | ORAL | Status: DC
  Administered 2020-01-10 – 2020-02-04 (×47): 6.25 mg via ORAL
  Filled 2020-01-10 (×49): qty 1

## 2020-01-10 MED ORDER — HYDROCHLOROTHIAZIDE 12.5 MG PO CAPS
12.5000 mg | ORAL_CAPSULE | Freq: Every day | ORAL | Status: DC
  Administered 2020-01-11 – 2020-01-24 (×14): 12.5 mg via ORAL
  Filled 2020-01-10 (×14): qty 1

## 2020-01-10 MED ORDER — DONEPEZIL HCL 10 MG PO TABS
10.0000 mg | ORAL_TABLET | Freq: Every day | ORAL | Status: DC
  Administered 2020-01-10 – 2020-02-03 (×24): 10 mg via ORAL
  Filled 2020-01-10 (×25): qty 1

## 2020-01-10 MED ORDER — DOXAZOSIN MESYLATE 4 MG PO TABS
4.0000 mg | ORAL_TABLET | Freq: Every day | ORAL | Status: DC
  Administered 2020-01-11 – 2020-02-04 (×25): 4 mg via ORAL
  Filled 2020-01-10 (×25): qty 1

## 2020-01-10 MED ORDER — INSULIN GLARGINE 100 UNIT/ML ~~LOC~~ SOLN
20.0000 [IU] | Freq: Every day | SUBCUTANEOUS | Status: DC
  Administered 2020-01-11 – 2020-01-22 (×12): 20 [IU] via SUBCUTANEOUS
  Filled 2020-01-10 (×14): qty 0.2

## 2020-01-10 MED ORDER — ATORVASTATIN CALCIUM 40 MG PO TABS
40.0000 mg | ORAL_TABLET | Freq: Every day | ORAL | Status: DC
  Administered 2020-01-11 – 2020-02-04 (×25): 40 mg via ORAL
  Filled 2020-01-10 (×25): qty 1

## 2020-01-10 MED ORDER — ASPIRIN EC 81 MG PO TBEC
81.0000 mg | DELAYED_RELEASE_TABLET | Freq: Every day | ORAL | Status: DC
  Administered 2020-01-11 – 2020-02-04 (×25): 81 mg via ORAL
  Filled 2020-01-10 (×25): qty 1

## 2020-01-10 MED ORDER — INFLUENZA VAC A&B SA ADJ QUAD 0.5 ML IM PRSY: 0.5000 mL | PREFILLED_SYRINGE | INTRAMUSCULAR | Status: DC

## 2020-01-10 MED ORDER — HYDRALAZINE HCL 100 MG PO TABS: 100.0000 mg | ORAL_TABLET | Freq: Three times a day (TID) | ORAL | Status: DC

## 2020-01-10 MED ORDER — FINASTERIDE 5 MG PO TABS
5.0000 mg | ORAL_TABLET | Freq: Every day | ORAL | Status: DC
  Administered 2020-01-11 – 2020-02-04 (×25): 5 mg via ORAL
  Filled 2020-01-10 (×25): qty 1

## 2020-01-10 MED ORDER — ALLOPURINOL 100 MG PO TABS
100.0000 mg | ORAL_TABLET | Freq: Every day | ORAL | Status: DC
  Administered 2020-01-11 – 2020-02-04 (×25): 100 mg via ORAL
  Filled 2020-01-10 (×25): qty 1

## 2020-01-10 MED ORDER — FERROUS SULFATE 325 (65 FE) MG PO TABS: 325.0000 mg | ORAL_TABLET | Freq: Two times a day (BID) | ORAL | 3 refills | Status: DC

## 2020-01-10 MED ORDER — ISOSORBIDE MONONITRATE ER 30 MG PO TB24
60.0000 mg | ORAL_TABLET | Freq: Every day | ORAL | Status: DC
  Administered 2020-01-11 – 2020-02-04 (×25): 60 mg via ORAL
  Filled 2020-01-10 (×26): qty 2

## 2020-01-10 MED ORDER — RESOURCE THICKENUP CLEAR PO POWD: 1.0000 | ORAL | Status: DC | PRN

## 2020-01-10 NOTE — Progress Notes (Signed)
Verified with pt whether he has had the flu shot this year or not. Pt states he has had the flu vaccine this year.

## 2020-01-10 NOTE — Progress Notes (Signed)
Inpatient Rehabilitation Medication Review by a Pharmacist  A complete drug regimen review was completed for this patient to identify any potential clinically significant medication issues.  Clinically significant medication issues were identified:  Yes   Type of Medication Issue Identified Description of Issue Urgent (address now) Non-Urgent (address on AM team rounds) Plan Plan Accepted by Provider? (Yes / No / Pending AM Rounds)  Drug Interaction(s) (clinically significant)       Duplicate Therapy  Pt has order for flu shot for 01/11/20; per Epic immunization record, pt already rec'd flu shot on 12/17/19 at [facility] Non urgent Contact CIR provider via secure chat to review  Pending CIR provider review  Allergy       No Medication Administration End Date       Incorrect Dose       Additional Drug Therapy Needed  Pt on nectar thick diet; no order for Resource ThickenUp (was on Resource ThickenUp at [facility]); Ferrous sulfate BID was on discharge list, but not ordered on admission to CIR Non urgent Contact CIR provider via secure chat to review Pending CIR provider review  Other  The following meds were on pt's dicharge list from [facility], but were not ordered at [facility] or on admission to CIR: NTG Alendronate Non urgent Contact provider via secure chat to review Pending CIR provider review    Name of provider notified for urgent issues identified:  N/A  For non-urgent medication issues to be resolved on team rounds tomorrow morning a [facility] Secure Chat Handoff was sent to:  Marlowe Sh[place], PA  Time spent performing this drug regimen review (minutes):  [place], PharmD, BCPS, [facility] Clinical Pharmacist 01/10/2020 3:22 PM

## 2020-01-10 NOTE — Progress Notes (Signed)
Peter Ribas, MD  Physician  Physical Medicine and Rehabilitation  PMR Pre-admission     Signed  Date of Service:  01/08/2020  9:17 AM      Related encounter: ED to Hosp-Admission (Discharged) from 12/30/2019 in [facility]      Signed       Show:Clear all '[x]' Manual'[x]' Template'[x]' Copied  Added by: '[x]' Peter [name], Vertis Kelch, RN'[x]' Raulkar, Clide Deutscher, MD'[x]' Genella Mech, CCC-SLP  '[]' Hover for details PMR Admission Coordinator Pre-Admission Assessment   Patient: Peter [name] is an 86 y.o., male MRN: [id number] DOB: 16-Feb-1930 Height: '5\' 7"'  (170.2 cm) Weight: 72.5 kg   Insurance Information HMO:     PPO:      PCP:      IPA:      80/20: yes     OTHER:  PRIMARY: Medicare Part A and B      Policy#: [id number]      Subscriber:  Benefits:  Phone #:   Online passport once source 01/10/2020   Name:  Eff. Date:  A 04/11/1997 and B 07/12/2000 part A and 07/12/2000 part B.  Deductible $1484 CIR: 100%      SNF: 20 full days Outpatient: 80%     Co-Pay: 20% Home Health: 100%      Co-Pay:  DME: 80%     Co-Pay: 20% Providers: pt choice   SECONDARY: [employer] PPO:     Policy#: [id number]         The "Data Collection Information Summary" for patients in Inpatient Rehabilitation Facilities with attached "Privacy Act [place] Records" was provided and verbally reviewed with: Patient and wife   Emergency Contact Information    Peter [name] is main Veterinary surgeon Information     Name Relation Home Work Mobile    Peter [name] Daughter     [phone]    Peter [name], Peter [name] [phone]   [phone]    Peter [name] Daughter [phone]   [phone]         Current Medical History  Patient Admitting Diagnosis: R MCA ischemia   History of Present Illness  Peter [name] is a 86 y.o. right-handed male with history of CAD/PCI maintained on aspirin and Plavix, CKD stage III, dementia maintained on Aricept, diabetes mellitus, diastolic  congestive heart failure, hypertension, hyperlipidemia, peripheral vascular disease, prostate cancer, history of multiple syncopal episodes.  Has s a personal care attendant 7 days a week 12-6 PM.  Two-level home bed and bath main level 3 steps to entry.  Reportedly ambulated with a rolling walker using a wheelchair also at baseline depending on needs for that day.  Presented 12/30/2019 with left-sided weakness and slurred speech.  Cranial CT scan showed no acute changes.  Patient did receive TPA.  CT angiogram of head and neck negative for emergent large vessel occlusion.  Approximate 8 to 9 mm severe near occlusive stenosis of the proximal right M1 segment.  MRI of the brain showed patchy and hazy diffusion abnormality involving the right insular cortex subinsular white matter and overlying right frontal operculum consistent with acute right MCA territory ischemia.  No associated hemorrhage or mass-effect.  Admission chemistries unremarkable except glucose 130, troponin 35-50, alcohol negative, hemoglobin 8.7.  Echocardiogram with ejection fraction of 60 to 65% no wall motion abnormalities grade 2 diastolic dysfunction.  Hospital course gastroenterology consulted for anemia and Hemoccult positive stools.  Given patient's acute fresh CVA lack of overt destabilizing bleeding GI evaluation was deferred for now.  Would not pursue endoscopy or colonoscopy at least for the next 6 months.  Noted hemoglobin 7.5 patient was transfused 1 unit packed red blood cells 01/09/2020.  Cardiology services consulted for elevated troponin and history of CAD with PCI.  Patient currently maintained on aspirin  therapy advised to follow-up outpatient.  No ischemia work-up was planned.  Currently maintained on a regular consistency diet with nectar thick liquids.   Patient's medical record from [facility]  has been reviewed by the rehabilitation admission coordinator and physician.   Past Medical History      Past  Medical History:  Diagnosis Date  . Allergy    . Anemia due to stage 3b chronic kidney disease ([place]) 10/25/2019  . Arthritis    . Carpal tunnel syndrome    . CKD (chronic kidney disease), stage III ([place])    . Coronary artery disease      a. 03/2018 PCI [facility]): RCA 95p, 40m(2.5x38 SRansonDES); b. 06/2019 PCI: LM 40ost, LAD 85p, 60p/m (atherectomy w/ 3.0x48 Synergy DES p/m LAD), 544mLCX 50d, OM2/3 50, RCA patent prox/mid stent, 50d, RPDA 50.  . Diabetes mellitus without complication ([facility]   . Diastolic dysfunction      a. 06/2019 Echo: EF 60-65%, no rwma, mod LVH, Gr1 DD. Nl RV fxn. Mildly dil LA. Mild-mod TR.  . Diverticulitis    . GERD (gastroesophageal reflux disease)    . Gout    . History of blood transfusion    . History of chicken pox    . Hyperlipidemia    . Hypertension    . Mild dementia ([facility] [place]   . Myocardial infarction ([facility]/2020  . Normocytic anemia      a. 06/2019 s/p 1u PRBCs.  . Marland KitchenAD (peripheral artery disease) ([facility]     a. 11/2018 s/p R SFA, popliteal, and peroneal artery PTA.  . [facility]     prostate  . Prostate cancer ([facility]   . Syncope        Family History   family history includes Dementia in his father; Diabetes in his father and mother; Healthy in his daughter and daughter; Hypertension in his mother.   Prior Rehab/Hospitalizations Has the patient had prior rehab or hospitalizations prior to admission? Yes   Has the patient had major surgery during 100 days prior to admission? No               Current Medications   Current Facility-Administered Medications:  .  acetaminophen (TYLENOL) tablet 650 mg, 650 mg, Oral, Q4H PRN, 650 mg at 01/09/20 2124 **OR** acetaminophen (TYLENOL) 160 MG/5ML solution 650 mg, 650 mg, Per Tube, Q4H PRN **OR** acetaminophen (TYLENOL) suppository 650 mg, 650 mg, Rectal, Q4H PRN, Biby, Sharon L, NP .  allopurinol (ZYLOPRIM) tablet 100 mg, 100 mg, Oral, Daily, Biby, Sharon L, NP, 100 mg at 01/10/20  1127 .  amLODipine (NORVASC) tablet 10 mg, 10 mg, Oral, Daily, Biby, Sharon L, NP, 10 mg at 01/10/20 1127 .  aspirin EC tablet 81 mg, 81 mg, Oral, Daily, Biby, Sharon L, NP, 81 mg at 01/10/20 1126 .  atorvastatin (LIPITOR) tablet 40 mg, 40 mg, Oral, Daily, Biby, Sharon L, NP, 40 mg at 01/10/20 1127 .  carvedilol (COREG) tablet 6.25 mg, 6.25 mg, Oral, BID WC, Biby, Sharon L, NP, 6.25 mg at 01/10/20 1127 .  Chlorhexidine Gluconate Cloth 2 % PADS 6 each, 6 each, Topical, Daily, Biby,  Massie Kluver, NP, 6 each at 01/10/20 1128 .  docusate sodium (COLACE) capsule 100 mg, 100 mg, Oral, Daily, Biby, Sharon L, NP, 100 mg at 01/10/20 1126 .  donepezil (ARICEPT) tablet 10 mg, 10 mg, Oral, Daily, Biby, Sharon L, NP, 10 mg at 01/10/20 1127 .  doxazosin (CARDURA) tablet 4 mg, 4 mg, Oral, Daily, Biby, Sharon L, NP, 4 mg at 01/10/20 1125 .  DULoxetine (CYMBALTA) DR capsule 30 mg, 30 mg, Oral, Daily, Biby, Sharon L, NP, 30 mg at 01/10/20 1127 .  ferrous sulfate tablet 325 mg, 325 mg, Oral, BID WC, Rosalin Hawking, MD, 325 mg at 01/10/20 1127 .  finasteride (PROSCAR) tablet 5 mg, 5 mg, Oral, Daily, Biby, Sharon L, NP, 5 mg at 01/10/20 1126 .  gabapentin (NEURONTIN) capsule 300 mg, 300 mg, Oral, BID, Biby, Sharon L, NP, 300 mg at 01/10/20 1126 .  guaiFENesin-dextromethorphan (ROBITUSSIN DM) 100-10 MG/5ML syrup 5 mL, 5 mL, Oral, Q6H PRN, Rosalin Hawking, MD, 5 mL at 01/10/20 0522 .  hydrALAZINE (APRESOLINE) tablet 100 mg, 100 mg, Oral, TID, Rosalin Hawking, MD, 100 mg at 01/10/20 1126 .  hydrochlorothiazide (MICROZIDE) capsule 12.5 mg, 12.5 mg, Oral, Daily, Rinehuls, David L, PA-C, 12.5 mg at 01/10/20 1128 .  insulin aspart (novoLOG) injection 0-15 Units, 0-15 Units, Subcutaneous, TID WC, Donzetta Starch, NP, 2 Units at 01/10/20 0636 .  insulin aspart (novoLOG) injection 0-5 Units, 0-5 Units, Subcutaneous, QHS, Biby, Massie Kluver, NP, 2 Units at 01/08/20 2208 .  insulin glargine (LANTUS) injection 20 Units, 20 Units, Subcutaneous, Daily,  Rosalin Hawking, MD, 20 Units at 01/10/20 1128 .  isosorbide mononitrate (IMDUR) 24 hr tablet 60 mg, 60 mg, Oral, Daily, Biby, Sharon L, NP, 60 mg at 01/10/20 1127 .  labetalol (NORMODYNE) injection 20 mg, 20 mg, Intravenous, Q2H PRN, Biby, Sharon L, NP, 20 mg at 01/05/20 0556 .  memantine (NAMENDA) tablet 10 mg, 10 mg, Oral, BID, Biby, Sharon L, NP, 10 mg at 01/10/20 1128 .  multivitamin with minerals tablet 1 tablet, 1 tablet, Oral, Daily, Biby, Sharon L, NP, 1 tablet at 01/10/20 1126 .  pantoprazole (PROTONIX) EC tablet 40 mg, 40 mg, Oral, Daily, Rinehuls, David L, PA-C, 40 mg at 01/10/20 1126 .  [employer], , Oral, PRN, Rosalin Hawking, MD .  senna-docusate (Senokot-S) tablet 1 tablet, 1 tablet, Oral, QHS PRN, Donzetta Starch, NP .  ticagrelor (BRILINTA) tablet 90 mg, 90 mg, Oral, BID, Biby, Sharon L, NP, 90 mg at 01/10/20 1128   Patients Current Diet:     Diet Order                      Diet - low sodium heart healthy              Diet regular Room service appropriate? Yes; Fluid consistency: Nectar Thick  Diet effective now                      Precautions / Restrictions Precautions Precautions: Fall Restrictions Weight Bearing Restrictions: No    Has the patient had 2 or more falls or a fall with injury in the past year? Yes   Prior Activity Level Limited Community (1-2x/wk): did drive locally; He was Mod I with RW and needed some supervision with ADLS   Prior Functional Level Self Care: Did the patient need help bathing, dressing, using the toilet or eating? Needed supervision with adls    Indoor Mobility: Did the  patient need assistance with walking from room to room (with or without device)? Independent   Stairs: Did the patient need assistance with internal or external stairs (with or without device)? Independent   Functional Cognition: Did the patient need help planning regular tasks such as shopping or remembering to take medications? Dependent   Home  Assistive Devices / Equipment Home Assistive Devices/Equipment: None Home Equipment: Environmental consultant - 2 wheels, Wheelchair - manual, Shower seat - built in, Engineer, civil (consulting), Environmental consultant - 4 wheels   Prior Device Use: Indicate devices/aids used by the patient prior to current illness, exacerbation or injury? Walker and wheelchair depending on how he was for the day   Current Functional Level Cognition   Arousal/Alertness: Awake/alert Overall Cognitive Status: History of cognitive impairments - at baseline Orientation Level: Oriented X4 General Comments: oriented to month however deficits noted in relation to processing as well as safety awareness Attention: Sustained Sustained Attention: Appears intact Memory: Impaired Memory Impairment: Decreased recall of new information Awareness: Appears intact Problem Solving: Appears intact Safety/Judgment: Appears intact    Extremity Assessment (includes Sensation/Coordination)   Upper Extremity Assessment: Generalized weakness, RUE deficits/detail, LUE deficits/detail RUE Deficits / Details: AROM, WFLs LUE Deficits / Details: AROM, WFLs; small 3- to 3+/5 MM grade shoulder through digits  Lower Extremity Assessment: Overall WFL for tasks assessed, Defer to PT evaluation RLE Deficits / Details: geneeral weakness, pt relates acute on chronic in nature LLE Deficits / Details: mild general weakness LLE Coordination: decreased fine motor     ADLs   Overall ADL's : Needs assistance/impaired Grooming: Oral care, Minimal assistance, Standing, Supervision/safety, Sitting Grooming Details (indicate cue type and reason): minA during standing portion; supervision while seated, pt needing to sit for rest break during task completion, completing remainder of task while seated  Upper Body Dressing : Minimal assistance, Sitting Upper Body Dressing Details (indicate cue type and reason): donning new gown  Lower Body Dressing: Total assistance, Sitting/lateral  leans Lower Body Dressing Details (indicate cue type and reason): to don new socks from [facility] Toilet Transfer: Minimal assistance, Moderate assistance, BSC, RW, Stand-pivot Toilet Transfer Details (indicate cue type and reason): standpivot transfer from EOB>BSC with RW, up to MOD A when transitioning BUES from sitting surface to RW Toileting- Clothing Manipulation and Hygiene: Sit to/from stand, Total assistance Toileting - Clothing Manipulation Details (indicate cue type and reason): total A for posterior pericare in standing Functional mobility during ADLs: Minimal assistance, Moderate assistance, Cueing for sequencing, Rolling walker General ADL Comments: pt continues to present with balance deficits, decreased activity tolerance, and generalized weakness impacting pts ability to complete BADLs     Mobility   Overal bed mobility: Needs Assistance Bed Mobility: Sit to Supine Supine to sit: Min assist, HOB elevated Sit to supine: Min assist General bed mobility comments: light MIN A to elevate BLEs back to bed     Transfers   Overall transfer level: Needs assistance Equipment used: Rolling walker (2 wheeled) Transfers: Sit to/from Stand, [employer] Transfers Sit to Stand: Min assist, Mod assist Stand pivot transfers: Min assist General transfer comment: pt completed x4 sit<>stands during session. pt required up to MOD A to stand needing most assist to steady self when transitioning BUES from sitting surface to RW, however as session progressed pt sit<>stand with MIN A. Pt completed x2 stand pivot transfers with MIN A needing assist to maneuver RW and cues to sequene pivotal steps     Ambulation / Gait / Stairs / Emergency planning/management officer  Ambulation/Gait Ambulation/Gait assistance: Min assist Gait Distance (Feet): 10 Feet (x2 bouts (~5 ft anterior then ~5 ft posterior)`) Assistive device: Rolling walker (2 wheeled) Gait Pattern/deviations: Shuffle, Decreased stride length, Trunk  flexed General Gait Details: Provided verbal and visual cues for pt to inc step length through cuing pt to step on therapist's toes, mod success. Pt demonstrated significantly improved bilat weight shifting for inc bilat step length on second bout when provided visual demonstration prior.  Gait velocity: reduced Gait velocity interpretation: <1.31 ft/sec, indicative of household ambulator     Posture / Balance Dynamic Sitting Balance Sitting balance - Comments: at least one UE supported during static sitting while pt sitting EOB to talk on phone Balance Overall balance assessment: Needs assistance Sitting-balance support: Feet supported, Single extremity supported Sitting balance-Leahy Scale: Poor Sitting balance - Comments: at least one UE supported during static sitting while pt sitting EOB to talk on phone Postural control: Posterior lean, Left lateral lean Standing balance support: Bilateral upper extremity supported, During functional activity Standing balance-Leahy Scale: Poor Standing balance comment: reliant on BUE support and external assist     Special needs/care consideration Skin: R toe amputation (old) daughter states toe to auto amputate, Cracking on bilateral heels, Ecchymosis to R toe.  Diabetic management: DM2 managed with sQ Novolog and Lantus; Hgb A1c 6.0 To consider 30 day cardiac event monitor as outpatint to rule out Afib Palliative for pain management    Previous Home Environment  Living Arrangements: Spouse/significant other  Lives With: Spouse Available Help at Discharge: Family, Personal care attendant, Other (Comment), Available PRN/intermittently Type of Home: House Home Layout:  (stays on ML) Alternate Level Stairs-Rails: Right, Left Alternate Level Stairs-Number of Steps: flight Home Access: Stairs to enter Entrance Stairs-Rails: Right, Left, Can reach both Entrance Stairs-Number of Steps: 3 Bathroom Shower/Tub: Multimedia programmer:  Handicapped height Bathroom Accessibility: Yes How Accessible: Accessible via walker [place]:  (aide private pay, can increase as needed) Additional Comments: Peter [name] main contact; pt's wife has Huntington's disease with cognition issues   Discharge Living Setting Plans for Discharge Living Setting: Patient's home Type of Home at Discharge: House Discharge Home Layout: Multi-level Alternate Level Stairs-Rails: Right Alternate Level Stairs-Number of Steps: 15 (15) Discharge Home Access: Stairs to enter Entrance Stairs-Rails: Can reach both Entrance Stairs-Number of Steps: 4 Discharge Bathroom Shower/Tub: Walk-in shower Discharge Bathroom Toilet: Handicapped height Discharge Bathroom Accessibility: Yes How Accessible: Accessible via wheelchair, Accessible via walker Does the patient have any problems obtaining your medications?: No   Social/Family/Support Systems Patient Roles: Spouse Contact Information: Peter [name] is main contact, wife with Huntington's Anticipated Caregiver: Peter [name], is main contact Anticipated Ambulance person Information: see above Ability/Limitations of Caregiver: Peter [name] lives in [place]. Patient and spouse live alone Caregiver Availability:  (SPouse avialalbe 24/7 but patient was MOd I with RW and supe) Discharge Plan Discussed with Primary Caregiver: Yes Is Caregiver In Agreement with Plan?: Yes Does Caregiver/Family have Issues with Lodging/Transportation while Pt is in Rehab?: No   Peter [name] lives in [place] and [place] lives in [place].  So Intermittent assist. Peter [name] is primary to talk to besides the wife. Wife has Huntington's, Peter [name] is an Network engineer.   Goals Patient/Family Goal for Rehab: PT, OT, and SLP Mod I to supervision Expected length of stay: 12-14 days Additional Information: followed by palliative for symptom pain management for back and knee pain Pt/Family Agrees to Admission and willing to participate: Yes Program  Orientation Provided & Reviewed with Pt/Caregiver Including  Roles  & Responsibilities: Yes Additional Information Needs: retired Arts development officer; loves music, plays trumpet   Decrease burden of Care through IP rehab admission: n/a   Possible need for SNF placement upon discharge: not anticipated   Patient Condition: I have reviewed medical records from [facility] , spoken with CM, and patient and daughter. I met with patient at the bedside and discussed via phone for inpatient rehabilitation assessment.  Patient will benefit from ongoing PT, OT and SLP, can actively participate in 3 hours of therapy a day 5 days of the week, and can make measurable gains during the admission.  Patient will also benefit from the coordinated team approach during an Inpatient Acute Rehabilitation admission.  The patient will receive intensive therapy as well as Rehabilitation physician, nursing, social worker, and care management interventions.  Due to safety, disease management, medication administration, pain management and patient education the patient requires 24 hour a day rehabilitation nursing.  The patient is currently  Min to mod asisst overall with mobility and basic ADLs.  Discharge setting and therapy post discharge at home with home health is anticipated.  Patient has agreed to participate in the Acute Inpatient Rehabilitation Program and will admit today.   Preadmission Screen Completed By:  Peter [name], 01/10/2020 12:00 PM ______________________________________________________________________   Discussed status with Dr. Ranell Patrick on  01/10/2020  at  1145 and received approval for admission today.   Admission Coordinator:  Peter Burke, RN, time  2197 Date  01/10/2020    Assessment/Plan: Diagnosis: R MCA stroke, likely embolic 1. Does the need for close, 24 hr/day Medical supervision in concert with the patient's rehab needs make it unreasonable for this patient to be  served in a less intensive setting? Yes 2. Co-Morbidities requiring supervision/potential complications: HTN, DM type 2, CKD stage 3b, dementia, bradycardia, peripheral neuropathy, HLD 3. Due to bladder management, bowel management, safety, skin/wound care, disease management, medication administration, pain management and patient education, does the patient require 24 hr/day rehab nursing? Yes 4. Does the patient require coordinated care of a physician, rehab nurse, PT, OT to address physical and functional deficits in the context of the above medical diagnosis(es)? Yes Addressing deficits in the following areas: balance, endurance, locomotion, strength, transferring, bowel/bladder control, bathing, dressing, feeding, grooming, toileting and psychosocial support 5. Can the patient actively participate in an intensive therapy program of at least 3 hrs of therapy 5 days a week? Yes 6. The potential for patient to make measurable gains while on inpatient rehab is excellent 7. Anticipated functional outcomes upon discharge from inpatient rehab: modified independent PT, modified independent OT, modified independent SLP 8. Estimated rehab length of stay to reach the above functional goals is: 10-12 days 9. Anticipated discharge destination: Home 10. Overall Rehab/Functional Prognosis: excellent     MD Signature: Peter Cha, MD        Revision History                                                   Note Details  Author Peter Ribas, MD File Time 01/10/2020 12:06 PM  Author Type Physician Status Signed  Last Editor Peter Ribas, MD Service Physical Medicine and [place] # [id number] Admit Date 01/10/2020

## 2020-01-10 NOTE — Progress Notes (Signed)
Report given for patient to transfer to 4w-09
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
## 2020-01-10 NOTE — Progress Notes (Signed)
Physical Therapy Treatment Patient Details Name: Peter [name] MRN: [id number] DOB: 1930/08/11 Today's Date: 01/10/2020    History of Present Illness Pt is an 86 yo male presenting with acute onset of fluctuating slurred speech, L sided weakness, and difficulty swallowing s/p tPA. MRI concerning for R MCA territory ischemia. CTA revealed a small amount of secretions within the proximal R mainstem bronchus, suggestive of aspiration risk. PMH includes: prostate ca, PAD, MI, mild dementia, HTN, HLD, gout, GERD, DM, CAD, CKD     PT Comments    Pt has made significant progress towards his goals this date. He was able to transition supine to sit with HOB elevated with minA, transfer sit to stand with minA, and ambulate ~5 ft anteriorly followed by ~5 ft posteriorly x2 separate bouts with a RW with only minA this date. The pt responds well to visual demonstration/cues as he displayed improved bilat weight shifting and thus improved bilat step length during his second gait bout following a visual demonstration. The pt would benefit greatly from intensive therapy sessions in the CIR setting to maximize his independence and safety with functional mobility. Will continue to follow acutely.    Follow Up Recommendations  CIR;Supervision/Assistance - 24 hour     Equipment Recommendations  Wheelchair (measurements PT);Wheelchair cushion (measurements PT);Other (comment) (mechanical lift if home without CIR)    Recommendations for Other Services Rehab consult     Precautions / Restrictions Precautions Precautions: Fall Restrictions Weight Bearing Restrictions: No    Mobility  Bed Mobility Overal bed mobility: Needs Assistance Bed Mobility: Supine to Sit     Supine to sit: Min assist;HOB elevated     General bed mobility comments: Cues to manage legs off EOB and to push up with hands to ascend trunk, minA to complete with extra time.  Transfers Overall transfer level: Needs  assistance Equipment used: Rolling walker (2 wheeled) Transfers: Sit to/from Stand Sit to Stand: Min assist         General transfer comment: Visual demonstration with verbal cues provided to sequence transfer to stand, cuing pt to extend knees and hips and to push up through hands. MinA and extra time to power up to stand safely.  Ambulation/Gait Ambulation/Gait assistance: Min assist Gait Distance (Feet): 10 Feet (x2 bouts (~5 ft anterior then ~5 ft posterior)`) Assistive device: Rolling walker (2 wheeled) Gait Pattern/deviations: Shuffle;Decreased stride length;Trunk flexed Gait velocity: reduced Gait velocity interpretation: <1.31 ft/sec, indicative of household ambulator General Gait Details: Provided verbal and visual cues for pt to inc step length through cuing pt to step on therapist's toes, mod success. Pt demonstrated significantly improved bilat weight shifting for inc bilat step length on second bout when provided visual demonstration prior.    Stairs             Wheelchair Mobility    Modified Rankin (Stroke Patients Only) Modified Rankin (Stroke Patients Only) Pre-Morbid Rankin Score: No significant disability Modified Rankin: Moderately severe disability     Balance Overall balance assessment: Needs assistance Sitting-balance support: Bilateral upper extremity supported;Feet supported Sitting balance-Leahy Scale: Poor Sitting balance - Comments: UE support with min guard for safe static sitting EOB.   Standing balance support: Bilateral upper extremity supported Standing balance-Leahy Scale: Poor Standing balance comment: MinA and bilat UE support on RW to maintain balance.                            Cognition Arousal/Alertness: Awake/alert Behavior During  Therapy: WFL for tasks assessed/performed Overall Cognitive Status: History of cognitive impairments - at baseline                                 General Comments:  significatn impairments in awareness and safety, poor processing and limited awareness of deficits      Exercises      General Comments        Pertinent Vitals/Pain Pain Assessment: Faces Faces Pain Scale: Hurts little more Pain Location: R toe and lower back Pain Descriptors / Indicators: Discomfort;Grimacing;Guarding Pain Intervention(s): Limited activity within patient's tolerance;Monitored during session;Repositioned    Home Living                      Prior Function            PT Goals (current goals can now be found in the care plan section) Acute Rehab PT Goals Patient Stated Goal: to improve PT Goal Formulation: With patient Time For Goal Achievement: 01/14/20 Potential to Achieve Goals: Good Progress towards PT goals: Progressing toward goals    Frequency    Min 4X/week      PT Plan Current plan remains appropriate    Co-evaluation              AM-PAC PT "6 Clicks" Mobility   Outcome Measure  Help needed turning from your back to your side while in a flat bed without using bedrails?: A Little Help needed moving from lying on your back to sitting on the side of a flat bed without using bedrails?: A Little Help needed moving to and from a bed to a chair (including a wheelchair)?: A Little Help needed standing up from a chair using your arms (e.g., wheelchair or bedside chair)?: A Little Help needed to walk in hospital room?: A Little Help needed climbing 3-5 steps with a railing? : Total 6 Click Score: 16    End of Session Equipment Utilized During Treatment: Gait belt Activity Tolerance: Patient tolerated treatment well Patient left: in chair;with call bell/phone within reach;with chair alarm set Nurse Communication: Mobility status PT Visit Diagnosis: Other abnormalities of gait and mobility (R26.89);Unsteadiness on feet (R26.81);Difficulty in walking, not elsewhere classified (R26.2);Other symptoms and signs involving the nervous  system (J69.678)     Time: 9381-0175 PT Time Calculation (min) (ACUTE ONLY): 22 min  Charges:  $Gait Training: 8-22 mins                     Moishe Spice, PT, DPT Acute Rehabilitation Services  Pager: [phone] Office: [place] 01/10/2020, 10:23 AM

## 2020-01-10 NOTE — Progress Notes (Signed)
Patient ID: Peter [name], male   DOB: 09/08/1930, 86 y.o.   MRN: [id number] Admit to unit, reviewed therapy schedule , medications and plan of care. Margarito Liner

## 2020-01-10 NOTE — Progress Notes (Signed)
Occupational Therapy Treatment Patient Details Name: Peter [name] MRN: [id number] DOB: [birth year] Today's Date: 01/10/2020    History of present illness Pt is an 86 yo male presenting with acute onset of fluctuating slurred speech, L sided weakness, and difficulty swallowing s/p tPA. MRI concerning for R MCA territory ischemia. CTA revealed a small amount of secretions within the proximal R mainstem bronchus, suggestive of aspiration risk. PMH includes: prostate ca, PAD, MI, mild dementia, HTN, HLD, gout, GERD, DM, CAD, CKD    OT comments  Pt making steady progress towards OT goals this session. Pt continues to present with balance deficits, decreased activity tolerance, and generalized weakness impacting pts ability to complete BADLs. Pt required up to MOD A initially to power into standing needing most assist to steady self when transitioning BUEs from sitting surface to RW, however as session progressed pt able to stand with MIN A. Pt completed x2 stand pivot transfer with RW needing MIN A to manage RW and sequence pivotal steps. Pt currently requires [place] for UB ADLs and total A for LB ADLs. Updated DC plan as indicated below. Feel pt more appropriate for an intensive rehab stay d/t deficits listed below. Will let OTR know about change in POC. Will continue to follow acutely.    Follow Up Recommendations  CIR    Equipment Recommendations  3 in 1 bedside commode    Recommendations for Other Services      Precautions / Restrictions Precautions Precautions: Fall Restrictions Weight Bearing Restrictions: No       Mobility Bed Mobility Overal bed mobility: Needs Assistance Bed Mobility: Sit to Supine     Sit to supine: Min assist   General bed mobility comments: light MIN A to elevate BLEs back to bed  Transfers Overall transfer level: Needs assistance Equipment used: Rolling walker (2 wheeled) Transfers: Sit to/from [facility] Sit to Stand: Min  assist;Mod assist Stand pivot transfers: Min assist       General transfer comment: pt completed x4 sit<>stands during session. pt required up to MOD A to stand needing most assist to steady self when transitioning BUES from sitting surface to RW, however as session progressed pt sit<>stand with MIN A. Pt completed x2 stand pivot transfers with MIN A needing assist to maneuver RW and cues to sequene pivotal steps    Balance Overall balance assessment: Needs assistance Sitting-balance support: Feet supported;Single extremity supported Sitting balance-Leahy Scale: Poor Sitting balance - Comments: at least one UE supported during static sitting while pt sitting EOB to talk on phone   Standing balance support: Bilateral upper extremity supported;During functional activity Standing balance-Leahy Scale: Poor Standing balance comment: reliant on BUE support and external assist                           ADL either performed or assessed with clinical judgement   ADL Overall ADL's : Needs assistance/impaired                 Upper Body Dressing : Minimal assistance;Sitting Upper Body Dressing Details (indicate cue type and reason): donning new gown  Lower Body Dressing: Total assistance;Sitting/lateral leans Lower Body Dressing Details (indicate cue type and reason): to don new socks from [facility] Toilet Transfer: Minimal assistance;Moderate assistance;BSC;RW;Stand-pivot Toilet Transfer Details (indicate cue type and reason): standpivot transfer from EOB>BSC with RW, up to MOD A when transitioning BUES from sitting surface to RW Toileting- Clothing Manipulation and Hygiene: Sit to/from stand;Total assistance  Toileting - Clothing Manipulation Details (indicate cue type and reason): total A for posterior pericare in standing     Functional mobility during ADLs: Minimal assistance;Moderate assistance;Cueing for sequencing;Rolling walker General ADL Comments: pt continues to present  with balance deficits, decreased activity tolerance, and generalized weakness impacting pts ability to complete BADLs     Vision       Perception     Praxis      Cognition Arousal/Alertness: Awake/alert Behavior During Therapy: WFL for tasks assessed/performed Overall Cognitive Status: History of cognitive impairments - at baseline                                 General Comments: oriented to month however deficits noted in relation to processing as well as safety awareness        Exercises Other Exercises Other Exercises: noted improvements in AROM with LUE- sh flexion WFL and grip strength 4/5   Shoulder Instructions       General Comments      Pertinent Vitals/ Pain       Pain Assessment: No/denies pain Faces Pain Scale: Hurts little more Pain Location: R toe and lower back Pain Descriptors / Indicators: Discomfort;Grimacing;Guarding Pain Intervention(s): Limited activity within patient's tolerance;Monitored during session;Repositioned  Home Living                           Bathroom Accessibility: Yes How Accessible: Accessible via walker     Additional Comments: [name] main contact; pt's wife has Huntington's disease with cognition issues      Prior Functioning/Environment Level of Independence: Independent with assistive device(s)        Comments: Mod I with RW unless back and foot pain he uses wheelchair   Frequency  Min 2X/week        Progress Toward Goals  OT Goals(current goals can now be found in the care plan section)  Progress towards OT goals: Progressing toward goals  Acute Rehab OT Goals Patient Stated Goal: to improve OT Goal Formulation: With patient Time For Goal Achievement: 01/15/20 Potential to Achieve Goals: Good  Plan Discharge plan needs to be updated;Frequency needs to be updated    Co-evaluation                 AM-PAC OT "6 Clicks" Daily Activity     Outcome Measure   Help from  another person eating meals?: None Help from another person taking care of personal grooming?: A Little Help from another person toileting, which includes using toliet, bedpan, or urinal?: A Lot Help from another person bathing (including washing, rinsing, drying)?: A Lot Help from another person to put on and taking off regular upper body clothing?: A Lot Help from another person to put on and taking off regular lower body clothing?: Total 6 Click Score: 14    End of Session Equipment Utilized During Treatment: Gait belt;Rolling walker;Other (comment) (BSC)  OT Visit Diagnosis: Unsteadiness on feet (R26.81);Muscle weakness (generalized) (M62.81) Hemiplegia - Right/Left: Left Hemiplegia - caused by: Unspecified   Activity Tolerance Patient tolerated treatment well   Patient Left in bed;with call bell/phone within reach;with bed alarm set   Nurse Communication Mobility status;Other (comment) (needs new condom cath and new protective sacral dressing back to bed)        Time: 5956-3875 OT Time Calculation (min): 38 min  Charges: OT General Charges $OT Visit: 1  Visit OT Treatments $Self Care/Home Management : 38-52 mins  Lanier Clam., COTA/L Acute Rehabilitation Services [phone] [place] 01/10/2020, 11:56 AM

## 2020-01-10 NOTE — Progress Notes (Signed)
Per telemetry patient had 11 beat run of SVT, nonsustained.  Stripped saved.

## 2020-01-10 NOTE — Progress Notes (Signed)
RN called into room by patient who was complaining that dressing was wrapped to tightly causing pain.  RN removed prevalon boot and gauze dressing.  Xeroform was applied according to order.  RN cleansed wound and redressed with new dressing according to order.  Pt stated that it felt better at this time.

## 2020-01-10 NOTE — Progress Notes (Signed)
Reviewed Dr. Rosezella [place] and GI note from yesterday. No further recommendations.  Plavix changed to Brillinta this admission 2nd to stroke s/p tPA.   [facility] will sign off.   Medication Recommendations:  Continue current therpay.  Other recommendations (labs, testing, etc):  None Follow up as an outpatient:  Dr. Saunders Revel 02/23/20

## 2020-01-10 NOTE — Progress Notes (Signed)
Inpatient Rehabilitation Admissions Coordinator  I met with patient at bedside and spoke with his wife by phone Both are in agreement to admit to [place] today. I have left voicemail for his daughter, [name] to contact me . I have alerted acute team and TOC that I can admit to [facility] today. I will make the arrangements.  Danne Baxter, RN, MSN Rehab Admissions Coordinator [phone] 01/10/2020 11:32 AM
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
## 2020-01-11 ENCOUNTER — Inpatient Hospital Stay (HOSPITAL_COMMUNITY): Payer: Medicare Other

## 2020-01-11 ENCOUNTER — Inpatient Hospital Stay (HOSPITAL_COMMUNITY): Payer: Medicare Other | Admitting: Occupational Therapy

## 2020-01-11 DIAGNOSIS — I63511 Cerebral infarction due to unspecified occlusion or stenosis of right middle cerebral artery: Secondary | ICD-10-CM | POA: Diagnosis not present

## 2020-01-11 DIAGNOSIS — E440 Moderate protein-calorie malnutrition: Secondary | ICD-10-CM | POA: Insufficient documentation

## 2020-01-11 LAB — CBC WITH DIFFERENTIAL/PLATELET
Abs Immature Granulocytes: 0.04 10*3/uL (ref 0.00–0.07)
Basophils Absolute: 0 10*3/uL (ref 0.0–0.1)
Basophils Relative: 1 %
Eosinophils Absolute: 0.1 10*3/uL (ref 0.0–0.5)
Eosinophils Relative: 2 %
HCT: 26.2 % — ABNORMAL LOW (ref 39.0–52.0)
Hemoglobin: 8.4 g/dL — ABNORMAL LOW (ref 13.0–17.0)
Immature Granulocytes: 1 %
Lymphocytes Relative: 18 %
Lymphs Abs: 1.5 10*3/uL (ref 0.7–4.0)
MCH: 26.8 pg (ref 26.0–34.0)
MCHC: 32.1 g/dL (ref 30.0–36.0)
MCV: 83.4 fL (ref 80.0–100.0)
Monocytes Absolute: 0.7 10*3/uL (ref 0.1–1.0)
Monocytes Relative: 9 %
Neutro Abs: 5.9 10*3/uL (ref 1.7–7.7)
Neutrophils Relative %: 69 %
Platelets: 292 10*3/uL (ref 150–400)
RBC: 3.14 MIL/uL — ABNORMAL LOW (ref 4.22–5.81)
RDW: 17 % — ABNORMAL HIGH (ref 11.5–15.5)
WBC: 8.4 10*3/uL (ref 4.0–10.5)
nRBC: 0 % (ref 0.0–0.2)

## 2020-01-11 LAB — COMPREHENSIVE METABOLIC PANEL
ALT: 45 U/L — ABNORMAL HIGH (ref 0–44)
AST: 24 U/L (ref 15–41)
Albumin: 2.4 g/dL — ABNORMAL LOW (ref 3.5–5.0)
Alkaline Phosphatase: 68 U/L (ref 38–126)
Anion gap: 12 (ref 5–15)
BUN: 28 mg/dL — ABNORMAL HIGH (ref 8–23)
CO2: 26 mmol/L (ref 22–32)
Calcium: 8.1 mg/dL — ABNORMAL LOW (ref 8.9–10.3)
Chloride: 101 mmol/L (ref 98–111)
Creatinine, Ser: 1.34 mg/dL — ABNORMAL HIGH (ref 0.61–1.24)
GFR, Estimated: 51 mL/min — ABNORMAL LOW (ref >60)
Glucose, Bld: 79 mg/dL (ref 70–99)
Potassium: 3.4 mmol/L — ABNORMAL LOW (ref 3.5–5.1)
Sodium: 139 mmol/L (ref 135–145)
Total Bilirubin: 0.8 mg/dL (ref 0.3–1.2)
Total Protein: 5 g/dL — ABNORMAL LOW (ref 6.5–8.1)

## 2020-01-11 LAB — GLUCOSE, CAPILLARY
Glucose-Capillary: 77 mg/dL (ref 70–99)
Glucose-Capillary: 154 mg/dL — ABNORMAL HIGH (ref 70–99)
Glucose-Capillary: 126 mg/dL — ABNORMAL HIGH (ref 70–99)
Glucose-Capillary: 142 mg/dL — ABNORMAL HIGH (ref 70–99)

## 2020-01-11 MED ORDER — INSULIN ASPART 100 UNIT/ML ~~LOC~~ SOLN
0.0000 [IU] | Freq: Every day | SUBCUTANEOUS | Status: DC
  Administered 2020-01-13 – 2020-01-27 (×2): 2 [IU] via SUBCUTANEOUS
  Administered 2020-01-28 – 2020-01-30 (×3): 3 [IU] via SUBCUTANEOUS
  Administered 2020-02-01: 2 [IU] via SUBCUTANEOUS
  Administered 2020-02-02: 3 [IU] via SUBCUTANEOUS
  Administered 2020-02-03: 2 [IU] via SUBCUTANEOUS

## 2020-01-11 MED ORDER — INSULIN ASPART 100 UNIT/ML ~~LOC~~ SOLN
0.0000 [IU] | Freq: Three times a day (TID) | SUBCUTANEOUS | Status: DC
  Administered 2020-01-11: 2 [IU] via SUBCUTANEOUS
  Administered 2020-01-12 – 2020-01-13 (×2): 3 [IU] via SUBCUTANEOUS
  Administered 2020-01-14: 8 [IU] via SUBCUTANEOUS
  Administered 2020-01-14 – 2020-01-15 (×2): 3 [IU] via SUBCUTANEOUS
  Administered 2020-01-16: 2 [IU] via SUBCUTANEOUS
  Administered 2020-01-16: 5 [IU] via SUBCUTANEOUS
  Administered 2020-01-17: 2 [IU] via SUBCUTANEOUS
  Administered 2020-01-19: 3 [IU] via SUBCUTANEOUS
  Administered 2020-01-19 – 2020-01-22 (×4): 2 [IU] via SUBCUTANEOUS
  Administered 2020-01-24 – 2020-01-26 (×2): 3 [IU] via SUBCUTANEOUS
  Administered 2020-01-27 – 2020-01-28 (×3): 5 [IU] via SUBCUTANEOUS
  Administered 2020-01-28: 2 [IU] via SUBCUTANEOUS
  Administered 2020-01-29: 5 [IU] via SUBCUTANEOUS
  Administered 2020-01-29: 3 [IU] via SUBCUTANEOUS
  Administered 2020-01-29: 5 [IU] via SUBCUTANEOUS
  Administered 2020-01-30: 8 [IU] via SUBCUTANEOUS
  Administered 2020-01-30: 3 [IU] via SUBCUTANEOUS
  Administered 2020-01-30: 15 [IU] via SUBCUTANEOUS
  Administered 2020-01-31 – 2020-02-02 (×6): 5 [IU] via SUBCUTANEOUS
  Administered 2020-02-03 (×2): 8 [IU] via SUBCUTANEOUS
  Administered 2020-02-04: 3 [IU] via SUBCUTANEOUS

## 2020-01-11 MED ORDER — POTASSIUM CHLORIDE CRYS ER 20 MEQ PO TBCR
40.0000 meq | EXTENDED_RELEASE_TABLET | Freq: Once | ORAL | Status: AC
  Administered 2020-01-11: 40 meq via ORAL
  Filled 2020-01-11: qty 2

## 2020-01-11 MED ORDER — GUAIFENESIN-DM 100-10 MG/5ML PO SYRP
5.0000 mL | ORAL_SOLUTION | Freq: Four times a day (QID) | ORAL | Status: DC | PRN
  Administered 2020-02-03: 5 mL via ORAL
  Filled 2020-01-11 (×2): qty 5

## 2020-01-11 MED ORDER — INSULIN ASPART 100 UNIT/ML ~~LOC~~ SOLN: 0.0000 [IU] | Freq: Three times a day (TID) | SUBCUTANEOUS | Status: DC

## 2020-01-11 NOTE — Progress Notes (Signed)
Physical Therapy Session Note  Patient Details  Name: Peter [name] MRN: [id number] Date of Birth: Aug 06, 1930  Today's Date: 01/11/2020 PT Individual Time: 9191-6606 PT Individual Time Calculation (min): 30 min   Short Term Goals: Week 1:  PT Short Term Goal 1 (Week 1): Pt will ambulate 36ft w/RW and mod assist PT Short Term Goal 2 (Week 1): Pt will transfer bed to/from wc w/RW and mod assist PT Short Term Goal 3 (Week 1): Pt will maintain static sitting balance on edge of bed w/min assist  Skilled Therapeutic Interventions/Progress Updates:  Patient in bed asleep upon PT arrival. Patient easily aroused and agreeable to PT session. Patient reported mild R toe pain during session, RN made aware. PT provided repositioning, rest breaks, and distraction as pain interventions throughout session. 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 Reports toe loss due to a blister on his foot.   Patient reporting increased fatigue from full day of therapies. Also presented with persistent cough with sputum production at beginning of session. Reports cough improved with medication, RN made aware.   Therapeutic Activity: Bed Mobility: Patient performed supine to/from sit with min A. Provided verbal cues for rolling L, setting bottom elbow, and pushing to his elbow, then hand to come to sitting. Patient scooted EOB with supervision x3 with only small shifts in positioning.  Transfers: Patient attempted sit to/from stand using a RW from low bed then with the bed elevated with max A, patient unable to come to standing with poor initiation and motor planning for mobility. Provided verbal cues for hand placement on RW, forward weight shift, foot placement and initiation.  Neuromuscular Re-ed: Patient performed the following sitting balance activities: -mid-line orientation: patient initially supervision in sitting at  midline, following transfer attempts patient with mild progressing to moderate L lean in sitting, cued patient for B hands in his lap to reduce pushing with R hand, and provided external cue for self correcting to midline (touching his R shoulder the therapist's shoulder on his R) x4 min -dynamic sitting balance: seated to figure four sitting R/L x3 with increased posterior lean with hip flexion with CGA-min A, and lateral leans to the L x5 with L hand pushing to return to midline, required cues for finding midline again after leaning L  Patient in bed at end of session with breaks locked, bed alarm set, and all needs within reach.    Therapy Documentation Precautions:  Precautions Precautions: Fall Precaution Comments: hx syncope, L lean, hx dementia Restrictions Weight Bearing Restrictions: No   Therapy/Group: Individual Therapy  Autumn Gunn L Becket Wecker PT, DPT  01/11/2020, 9:26 PM

## 2020-01-11 NOTE — Plan of Care (Signed)
°  Problem: RH Balance Goal: LTG: Patient will maintain dynamic sitting balance (OT) Description: LTG:  Patient will maintain dynamic sitting balance with assistance during activities of daily living (OT) Flowsheets (Taken 01/11/2020 1713) LTG: Pt will maintain dynamic sitting balance during ADLs with: Supervision/Verbal cueing Goal: LTG Patient will maintain dynamic standing with ADLs (OT) Description: LTG:  Patient will maintain dynamic standing balance with assist during activities of daily living (OT)  Flowsheets (Taken 01/11/2020 1713) LTG: Pt will maintain dynamic standing balance during ADLs with: Contact Guard/Touching assist   Problem: Sit to Stand Goal: LTG:  Patient will perform sit to stand in prep for activites of daily living with assistance level (OT) Description: LTG:  Patient will perform sit to stand in prep for activites of daily living with assistance level (OT) Flowsheets (Taken 01/11/2020 1713) LTG: PT will perform sit to stand in prep for activites of daily living with assistance level: Contact Guard/Touching assist   Problem: RH Eating Goal: LTG Patient will perform eating w/assist, cues/equip (OT) Description: LTG: Patient will perform eating with assist, with/without cues using equipment (OT) Flowsheets (Taken 01/11/2020 1713) LTG: Pt will perform eating with assistance level of: Supervision/Verbal cueing   Problem: RH Grooming Goal: LTG Patient will perform grooming w/assist,cues/equip (OT) Description: LTG: Patient will perform grooming with assist, with/without cues using equipment (OT) Flowsheets (Taken 01/11/2020 1713) LTG: Pt will perform grooming with assistance level of: Supervision/Verbal cueing   Problem: RH Bathing Goal: LTG Patient will bathe all body parts with assist levels (OT) Description: LTG: Patient will bathe all body parts with assist levels (OT) Flowsheets (Taken 01/11/2020 1713) LTG: Pt will perform bathing with assistance level/cueing:  Minimal Assistance - Patient > 75%   Problem: RH Dressing Goal: LTG Patient will perform upper body dressing (OT) Description: LTG Patient will perform upper body dressing with assist, with/without cues (OT). Flowsheets (Taken 01/11/2020 1713) LTG: Pt will perform upper body dressing with assistance level of: Supervision/Verbal cueing Goal: LTG Patient will perform lower body dressing w/assist (OT) Description: LTG: Patient will perform lower body dressing with assist, with/without cues in positioning using equipment (OT) Flowsheets (Taken 01/11/2020 1713) LTG: Pt will perform lower body dressing with assistance level of: Minimal Assistance - Patient > 75%   Problem: RH Toileting Goal: LTG Patient will perform toileting task (3/3 steps) with assistance level (OT) Description: LTG: Patient will perform toileting task (3/3 steps) with assistance level (OT)  Flowsheets (Taken 01/11/2020 1713) LTG: Pt will perform toileting task (3/3 steps) with assistance level: Minimal Assistance - Patient > 75%   Problem: RH Tub/Shower Transfers Goal: LTG Patient will perform tub/shower transfers w/assist (OT) Description: LTG: Patient will perform tub/shower transfers with assist, with/without cues using equipment (OT) Flowsheets (Taken 01/11/2020 1713) LTG: Pt will perform tub/shower stall transfers with assistance level of: Contact Guard/Touching assist   Problem: RH Attention Goal: LTG Patient will demonstrate this level of attention during functional activites (OT) Description: LTG:  Patient will demonstrate this level of attention during functional activites  (OT) Flowsheets (Taken 01/11/2020 1713) Patient will demonstrate this level of attention during functional activites: Selective LTG: Patient will demonstrate this level of attention during functional activites (OT): Supervision
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
## 2020-01-11 NOTE — Progress Notes (Signed)
Initial Nutrition Assessment  DOCUMENTATION CODES:   Non-severe (moderate) malnutrition in context of chronic illness  INTERVENTION:   - Magic cup TID with meals, each supplement provides 290 kcal and 9 grams of protein  - Vital Cuisine Shake BID, each supplement provides 520 kcal and 22 grams of protein  - Encourage adequate PO intake  - RD will follow up for appropriateness of diet education at follow-up  NUTRITION DIAGNOSIS:   Severe Malnutrition related to chronic illness (dementia, CHF, prostate cancer) as evidenced by moderate fat depletion, severe muscle depletion, percent weight loss (10.4% weight loss in 6 months).  GOAL:   Patient will meet greater than or equal to 90% of their needs  MONITOR:   PO intake, Supplement acceptance, Diet advancement, Labs, Weight trends, Skin, I & O's  REASON FOR ASSESSMENT:   Malnutrition Screening Tool, Consult Diet education  ASSESSMENT:   86 year old male with PMH of CAD/PCI, CKD stage III, dementia, DM, CHF, HTN, HLD, PVD, prostate cancer. Presented 12/30/19 with left-sided weakness and slurred speech. Pt found to have acute right MCA stroke. Admitted to CIR on 11/29.   Spoke with pt at bedside. Pt brushing teeth with NT assistance at time of RD visit.  Pt reports appetite is "great" and that he has been eating "great." No meal completions charted since admission to CIR.  Pt endorses weight loss that began at the beginning of this year. He reports his UBW is 225 lbs and that now he weighs 150 lbs. He does not know why he lost weight. He reports that he has been eating the same amount as he always does. He states that he eats 3 large meals daily.  Reviewed weight history in chart. Pt with an 8.3 kg weight loss since 07/07/19. This is a 10.4% weight loss in 6 months which is significant for timeframe.  Pt is amenable to consuming oral nutrition supplements during admission. RD to order Magic Cups and [employer] with lunch  meals.  Medications reviewed and include: lantus 20 units daily, protonix  Labs reviewed: potassium 3.4, BUN 28, creatinine 1.34, hemoglobin 8.4 CBG's: 77-214 x 24 hours  NUTRITION - FOCUSED PHYSICAL EXAM:    Most Recent Value  Orbital Region Mild depletion  Upper Arm Region Moderate depletion  Thoracic and Lumbar Region Moderate depletion  Buccal Region Mild depletion  Temple Region Mild depletion  Clavicle Bone Region Severe depletion  Clavicle and Acromion Bone Region Severe depletion  Scapular Bone Region Moderate depletion  Dorsal Hand Severe depletion  Patellar Region Moderate depletion  Anterior Thigh Region Moderate depletion  Posterior Calf Region Moderate depletion  Edema (RD Assessment) None  Hair Reviewed  Eyes Reviewed  Mouth Reviewed  Skin Reviewed  Nails Reviewed       Diet Order:   Diet Order            Diet regular Room service appropriate? Yes; Fluid consistency: Nectar Thick  Diet effective now                 EDUCATION NEEDS:   Not appropriate for education at this time  Skin:  Skin Assessment: Skin Integrity Issues: Diabetic Ulcer: right middle toe Other: MASD to medial sacrum  Last BM:  01/11/20  Height:   Ht Readings from Last 1 Encounters:  01/10/20 5\' 7"  (1.702 m)    Weight:   Wt Readings from Last 1 Encounters:  01/10/20 71.8 kg    BMI:  Body mass index is 24.79 kg/m.  Estimated  Nutritional Needs:   Kcal:  1900-2100  Protein:  100-115 grams  Fluid:  2.0 L/day    Gustavus Bryant, MS, RD, LDN Inpatient Clinical Dietitian Please see AMiON for contact information.

## 2020-01-11 NOTE — Progress Notes (Signed)
[facility] PHYSICAL MEDICINE & REHABILITATION PROGRESS NOTE   Subjective/Complaints: Patient is very sleep this morning while wheeling back from therapy. PT says he did a good job and is worn out now. Patient states he slept poorly last night. I have ordered lavender drops to forehead at night.   ROS: + insomnia  Objective:   DG CHEST PORT 1 VIEW  Result Date: 01/09/2020 CLINICAL DATA:  Cough EXAM: PORTABLE CHEST 1 VIEW COMPARISON:  Chest radiograph dated 12/30/2019 FINDINGS: The heart is enlarged. There is minimal left basilar atelectasis. The right lung is clear. There is no pleural effusion or pneumothorax. Degenerative changes are seen in the spine. A cardiac loop recorder overlies the left chest. IMPRESSION: Minimal left basilar atelectasis. Electronically Signed   By: Zerita Boers M.D.   On: 01/09/2020 17:11   Recent Labs    01/10/20 0444 01/11/20 0701  WBC 8.8 8.4  HGB 8.4* 8.4*  HCT 26.4* 26.2*  PLT 275 292   Recent Labs    01/10/20 0444 01/11/20 0701  NA 138 139  K 3.9 3.4*  CL 103 101  CO2 22 26  GLUCOSE 116* 79  BUN 28* 28*  CREATININE 1.39* 1.34*  CALCIUM 8.2* 8.1*    Intake/Output Summary (Last 24 hours) at 01/11/2020 1135 Last data filed at 01/11/2020 0651 Gross per 24 hour  Intake 120 ml  Output 100 ml  Net 20 ml        Physical Exam: Vital Signs Blood pressure (!) 160/58, pulse 69, temperature 98.3 F (36.8 C), temperature source Oral, resp. rate 18, height 5\' 7"  (1.702 m), weight 71.8 kg, SpO2 97 %. Gen: no distress, normal appearing HEENT: oral mucosa pink and moist, NCAT Cardio: Reg rate Chest: normal effort, normal rate of breathing Abd: soft, non-distended Ext: no edema Skin: Clean and intact without signs of breakdown Neuro: Patient is alert in no acute distress.  Left facial droop. Mildly dysarthric.  Provides name and age.  Follows simple commands. 5/5 strength throughout.  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 Atrophy  bilateral hand muscles.  Psych: Pt's affect is appropriate. Pt is cooperative  Assessment/Plan: 1. Functional deficits which require 3+ hours per day of interdisciplinary therapy in a comprehensive inpatient rehab setting.  Physiatrist is providing close team supervision and 24 hour management of active medical problems listed below.  Physiatrist and rehab team continue to assess barriers to discharge/monitor patient progress toward functional and medical goals  Care Tool:  Bathing              Bathing assist       Upper Body Dressing/Undressing Upper body dressing        Upper body assist      Lower Body Dressing/Undressing Lower body dressing            Lower body assist       Toileting Toileting    Toileting assist       Transfers Chair/bed transfer  Transfers assist           Locomotion Ambulation   Ambulation assist              Walk 10 feet activity   Assist           Walk 50 feet activity   Assist           Walk 150 feet activity   Assist           Walk 10 feet on uneven surface  activity  Assist           Wheelchair     Assist               Wheelchair 50 feet with 2 turns activity    Assist            Wheelchair 150 feet activity     Assist          Blood pressure (!) 160/58, pulse 69, temperature 98.3 F (36.8 C), temperature source Oral, resp. rate 18, height 5\' 7"  (1.702 m), weight 71.8 kg, SpO2 97 %.    Medical Problem List and Plan: 1.  Left-sided weakness/dysphagia and dysarthria secondary to acute right MCA patchy infarct with right M1 occlusion status post TPA             -patient may shower             -ELOS/Goals: modI 10-12 days  -Interdisciplinary Team Conference today  2.  Antithrombotics: -DVT/anticoagulation: SCDs             -antiplatelet therapy: Aspirin 81 mg daily and Brilinta 90 mg twice daily x3 months then Brilinta alone 3. Pain  Management: Neurontin 300 mg twice daily, Cymbalta 30 mg daily. Pain is well controlled.  4. Mood: Namenda 10 mg twice daily, Aricept 10 mg daily             -antipsychotic agents: N/A 5. Neuropsych: This patient is capable of making decisions on his own behalf. 6. Skin/Wound Care: Routine skin checks 7. Fluids/Electrolytes/Nutrition: Routine in and outs with follow-up chemistries 8.  Dysphagia.  Regular consistency with nectar liquids.  Follow-up speech therapy 9.  Hypertension.  Norvasc 10 mg daily, Coreg 6.25 mg twice daily, Cardura 4 mg daily, hydralazine 100 mg 3 times daily, HCTZ 12.5 mg daily, Imdur 60 mg daily. Systolic BP elevated and diastolic BP low- monitor TID.  10.  History of prostate cancer.  Continue Proscar 5 mg daily. 11.  Diabetes mellitus.  Hemoglobin A1c 6.0.  Lantus insulin 20 units daily             11/29: CBG 214 last night. RD consult for dietary education.   11/30: improved control- high of 165.  12.  Diastolic congestive heart failure.  Monitor for any signs of fluid overload  13.  History of gout.  Allopurinol 100 mg daily.  Monitor for any gout flareups 14.  Acute on chronic anemia.  Follow-up outpatient GI services for future endoscopy colonoscopy.  Continue Protonix 40 mg daily 15.  Hyperlipidemia.  Lipitor 16.  CAD with PCI.  Continue aspirin and Brilinta for now follow-up outpatient cardiology service.  No current plan for ischemic work-up 17.  CKD stage III.  Follow-up chemistries. Cr 1.30 on 11/29.  18. Hypokalemia: K+ 3.4- supplement 67meq today and repeat tomorrow  LOS: 1 days A FACE TO FACE EVALUATION WAS PERFORMED  Yuriy Cui P Elmer Merwin 01/11/2020, 11:35 AM

## 2020-01-11 NOTE — Progress Notes (Signed)
Patient ID: Peter [name], male   DOB: 1930/03/29, 86 y.o.   MRN: [id number]  SW spoke with pt dtr [name] to get updates on d/c plan. Confirms plan is for pt to d/c to home with private aide service with [facility]. Current 12pm-6pm; 6hrs per day 7 day a week. States they are able to get more time if needed. Pt was independent prior to admission. Pt wife does have Huntington's Disease. Pt DME: bed rail, toilet rails, raised toilet seat, shower chair 3in1 BSC, RW, rollator, w/c, transport chair, elevated couch cushions, grab bars in bathroom, gait belts, and 4 steps to enter home with bilateral handrails. She would like to know if his wife is able to come and visit with her tomorrow since she has not seen him. SW informed will speak with attending and follow-up once there is more information.   SW made efforts to complete assessment with pt,however, pt was sleepy. SW to continue to make efforts to complete.  [name], MSW, [place] Office: [phone] Cell: [phone] Fax: [phone]
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
## 2020-01-11 NOTE — Patient Care Conference (Signed)
Inpatient RehabilitationTeam Conference and Plan of Care Update Date: 01/11/2020   Time: 10:14 AM    Patient Name: Peter [name]      Medical Record Number: [id number]  Date of Birth: 1930/10/07 Sex: Male         Room/Bed: 4W09C/4W09C-01 Payor Info: Payor: MEDICARE / Plan: MEDICARE PART A AND B / Product Type: *No Product type* /    Admit Date/Time:  01/10/2020  2:41 PM  Primary Diagnosis:  Right middle cerebral artery stroke [facility])  Hospital Problems: Principal Problem:   Right middle cerebral artery stroke [facility])    Expected Discharge Date: Expected Discharge Date:  (Set next week)  Team Members Present: Physician leading conference: Dr. Leeroy Cha Care Coodinator Present: [name], LCSWA;[place] Creig Hines, RN, BSN, CRRN Nurse Present: Rozetta Nunnery, RN PT Present: Apolinar Junes, PT OT Present: Laverle Hobby, OT SLP Present: Weston Anna, SLP PPS Coordinator present : Ileana Ladd, Burna Mortimer, SLP     Current Status/Progress Goal Weekly Team Focus  Bowel/Bladder   Continent/Incontinent of B/B  Reduce incontinent episodes  Assess B/B Qshift and PRN   Swallow/Nutrition/ Hydration   Eval Pending         ADL's   Eval pending         Mobility   Eval pending         Communication   Eval Pending         Safety/Cognition/ Behavioral Observations  Eval Pending         Pain   No pain reported  Pain <3/10  Assess pain Qshift and PRN   Skin   Diabetic ulcer on RT second toe, MASD on sacrum  No new skin breakdowns  Assess Qshift and PRN     Discharge Planning:  Pt to be assessed. Pt dtr [name] reports they currently have a private aide that helps 7days per week 12pm-6pm in which she was only proving supervision level of care and assistsanace with IADLs. Reports pt was independent prior to admission. Pt currently established with [facility] for HHPT.   Team Discussion: Continent B/B with some incontinent episodes. Nursing to begin timed toileting.  No reports of pain, patient is forgetful. OT, PT, SLP evals pending. Patient on target to meet rehab goals: Evals and goals are pending.  *See Care Plan and progress notes for long and short-term goals.   Revisions to Treatment Plan:  Not at this time due to pending evals.  Teaching Needs: Begin family education when appropriate.  Current Barriers to Discharge: Inaccessible home environment, Decreased caregiver support, Medical stability, Home enviroment access/layout, Incontinence and Lack of/limited family support  Possible Resolutions to Barriers: Continue current medications, timed toileting, provide emotional support to patient and family.     Medical Summary Current Status: Multiple BM today, CBGs well controlled, denies pain, somnolent after PT today but worked very hard  Barriers to Discharge: Medical stability;Decreased family/caregiver support  Barriers to Discharge Comments: Wife has Huntington's Disease but they have aide at home and can increase hours if needed, hypertensive, HR well controlled, hypokalemic Possible Resolutions to [employer] Focus: Monitor BP TID- currently with elevated systolic and soft diastolic, monitor HR TID and continue Coreg, supplement potassium and repeat weekly   Continued Need for Acute Rehabilitation Level of Care: The patient requires daily medical management by a physician with specialized training in physical medicine and rehabilitation for the following reasons: Direction of a multidisciplinary physical rehabilitation program to maximize functional independence : Yes Medical management of patient stability for  increased activity during participation in an intensive rehabilitation regime.: Yes Analysis of laboratory values and/or radiology reports with any subsequent need for medication adjustment and/or medical intervention. : Yes   I attest that I was present, lead the team conference, and concur with the assessment and plan of the  team.   Cristi Loron 01/11/2020, 12:50 PM

## 2020-01-11 NOTE — Progress Notes (Signed)
Inpatient Rehabilitation  Patient information reviewed and entered into eRehab system by Phylisha Dix M. Marlyss Cissell, M.A., CCC/SLP, PPS Coordinator.  Information including medical coding, functional ability and quality indicators will be reviewed and updated through discharge.    

## 2020-01-12 ENCOUNTER — Inpatient Hospital Stay (HOSPITAL_COMMUNITY): Payer: Medicare Other

## 2020-01-12 DIAGNOSIS — I63511 Cerebral infarction due to unspecified occlusion or stenosis of right middle cerebral artery: Secondary | ICD-10-CM | POA: Diagnosis not present

## 2020-01-12 LAB — GLUCOSE, CAPILLARY
Glucose-Capillary: 100 mg/dL — ABNORMAL HIGH (ref 70–99)
Glucose-Capillary: 105 mg/dL — ABNORMAL HIGH (ref 70–99)
Glucose-Capillary: 168 mg/dL — ABNORMAL HIGH (ref 70–99)
Glucose-Capillary: 148 mg/dL — ABNORMAL HIGH (ref 70–99)

## 2020-01-12 LAB — BASIC METABOLIC PANEL
Anion gap: 11 (ref 5–15)
BUN: 30 mg/dL — ABNORMAL HIGH (ref 8–23)
CO2: 26 mmol/L (ref 22–32)
Calcium: 8.3 mg/dL — ABNORMAL LOW (ref 8.9–10.3)
Chloride: 103 mmol/L (ref 98–111)
Creatinine, Ser: 1.49 mg/dL — ABNORMAL HIGH (ref 0.61–1.24)
GFR, Estimated: 45 mL/min — ABNORMAL LOW (ref >60)
Glucose, Bld: 98 mg/dL (ref 70–99)
Potassium: 3.8 mmol/L (ref 3.5–5.1)
Sodium: 140 mmol/L (ref 135–145)

## 2020-01-12 IMAGING — CT CT HEAD W/O CM
3 series · 15 of 47 positions shown, 18 images · non-contrast
Comparison: [DATE].

CLINICAL DATA: Altered mental status.

EXAM:
CT HEAD WITHOUT CONTRAST
TECHNIQUE: Contiguous axial images were obtained from the base of the skull
through the vertex without intravenous contrast.

[Series 3: head 5.0 h30s · axial · 0.40mm/px · z∈[-124,+26]mm · 9 of 36 slices shown, 12 images]
[im 3/36  brain]
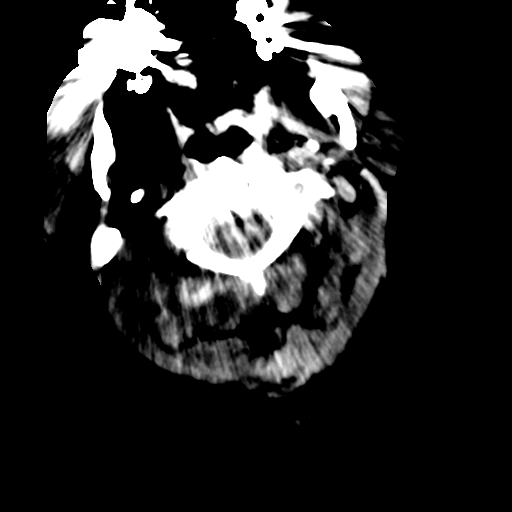
[im 3/36  bone]
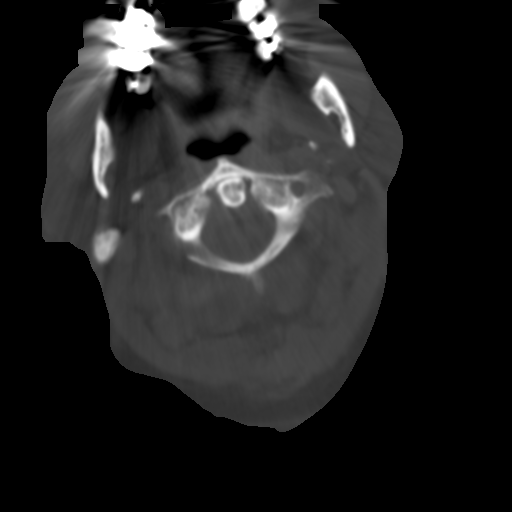
[im 7/36  brain]
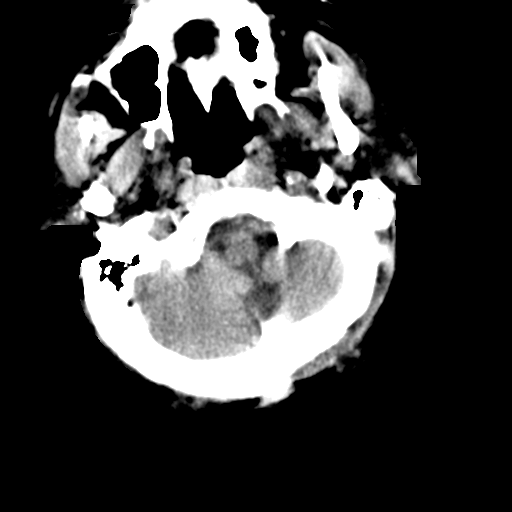
[im 10/36  brain]
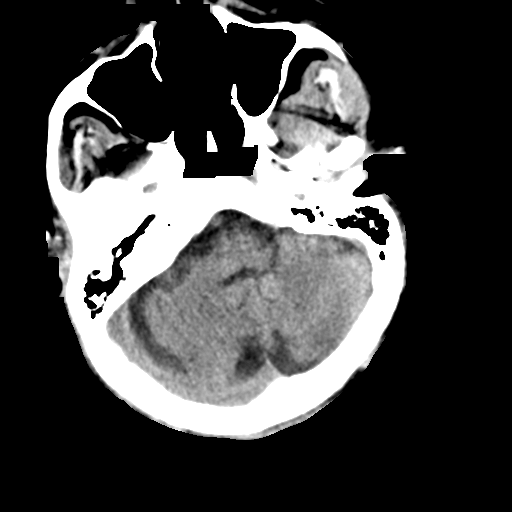
[im 14/36  brain]
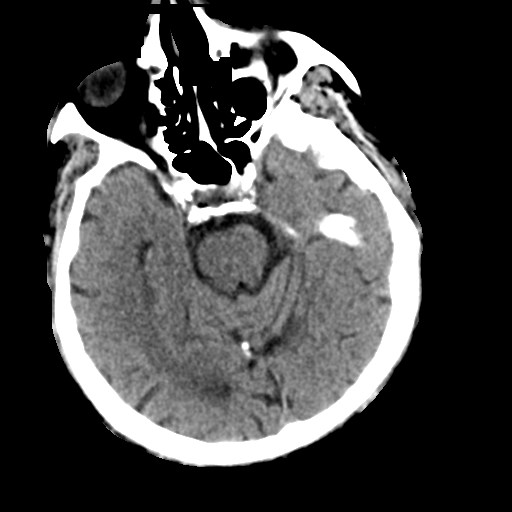
[im 19/36  brain]
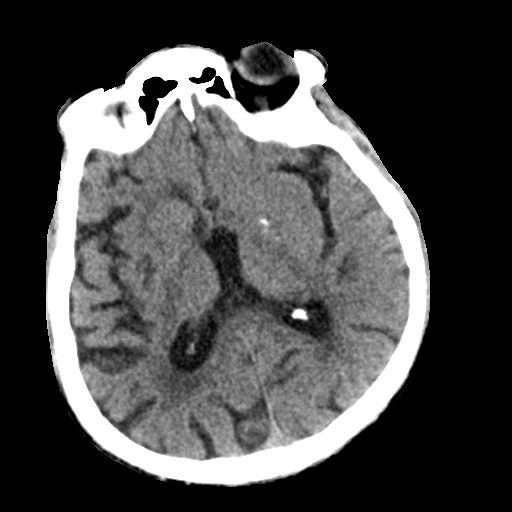
[im 19/36  bone]
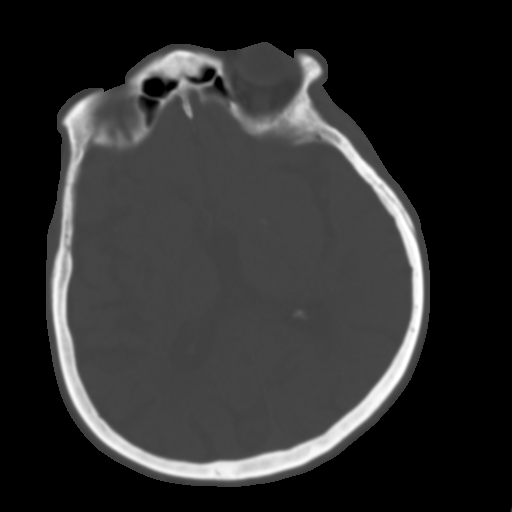
[im 22/36  brain]
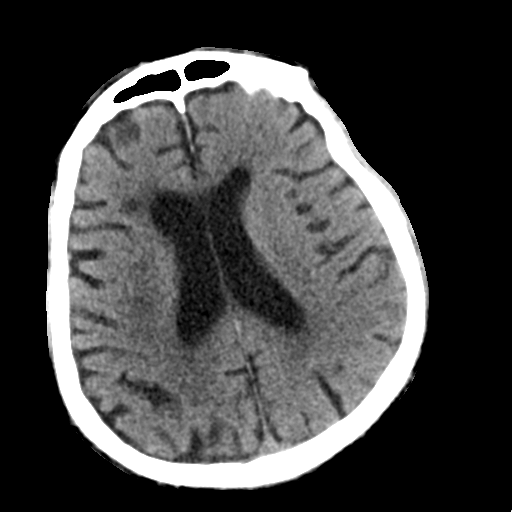
[im 26/36  brain]
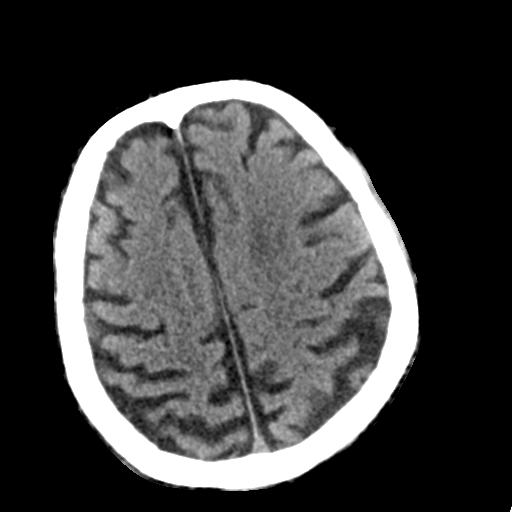
[im 29/36  brain]
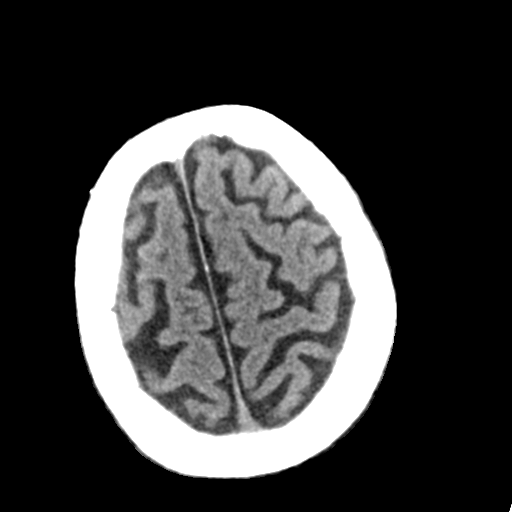
[im 33/36  brain]
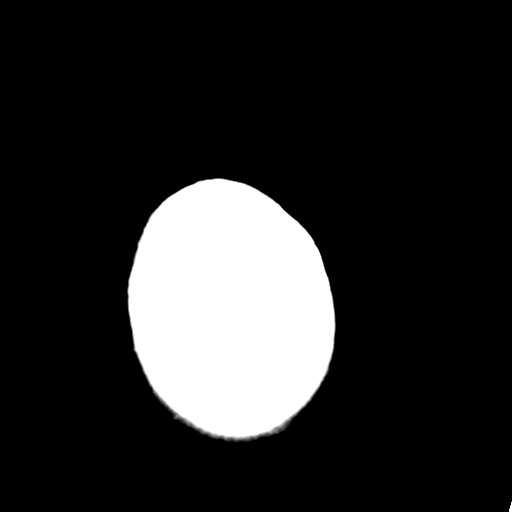
[im 33/36  bone]
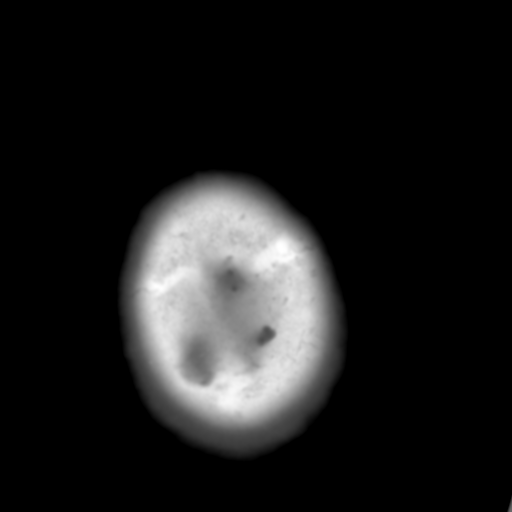

[Series 5: head 3.0 mpr cor · coronal · 0.35mm/px · 3 of 77 slices shown]
[im 26/77  brain]
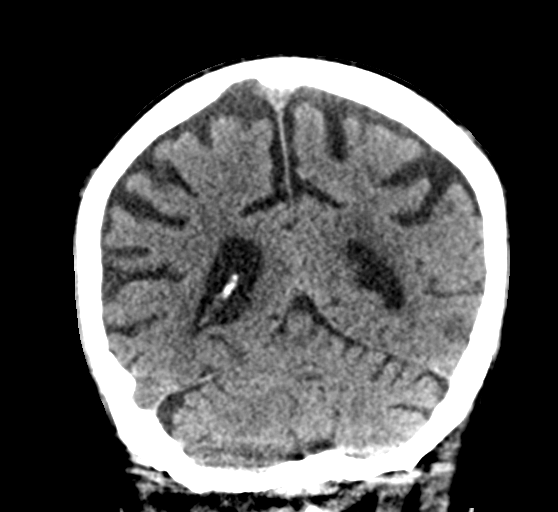
[im 34/77  brain]
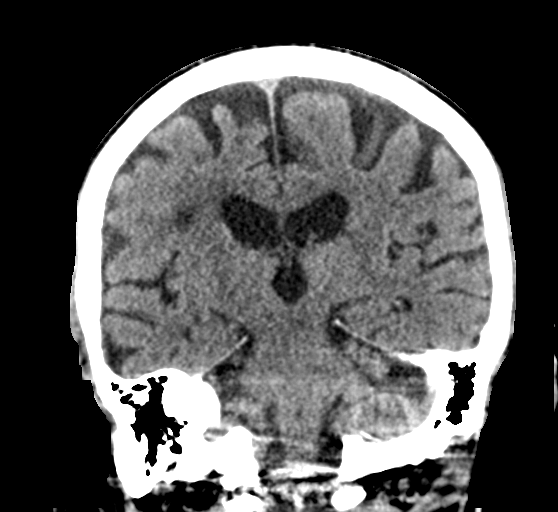
[im 43/77  brain]
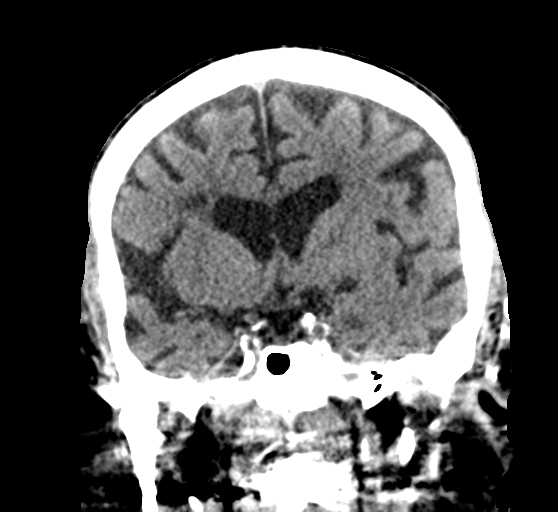

[Series 6: head 3.0 mpr sag · sagittal · 0.35mm/px · 3 of 66 slices shown]
[im 25/66  brain]
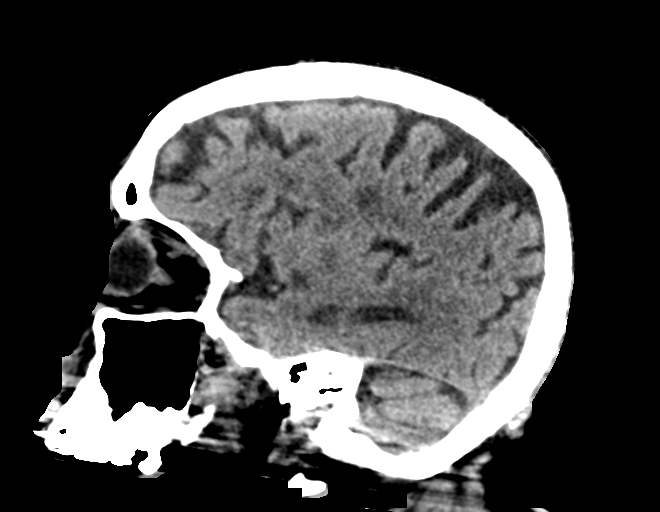
[im 33/66  brain]
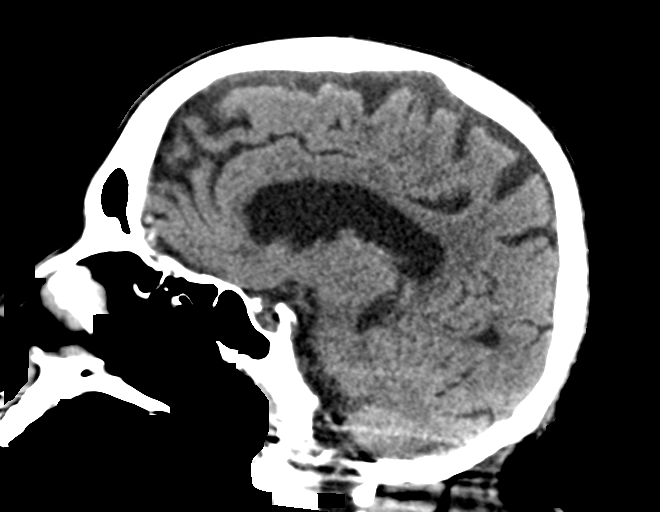
[im 41/66  brain]
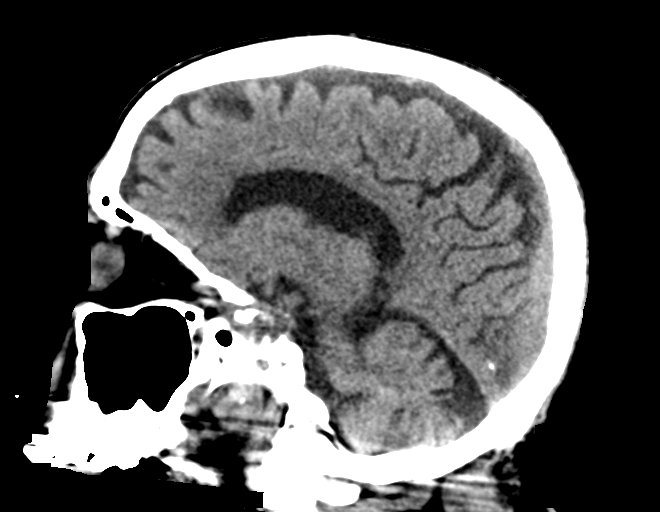

[15 of 47 positions shown; findings below may reference images not displayed]

FINDINGS: Brain: Mild diffuse cortical atrophy is noted. Mild chronic ischemic
white matter disease is noted. No hemorrhage or mass lesion is
noted. Ventricular size is within normal limits. No midline shift is
noted. Low density noted in right insular cortex and underlying
white matter is decreased compared to prior exam consistent with
acute to subacute infarction. There are now noted new rounded low
densities in the right periventricular white matter consistent with
acute infarctions.

Vascular: No hyperdense vessel or unexpected calcification.

Skull: Normal. Negative for fracture or focal lesion.

Sinuses/Orbits: No acute finding.

Other: None.
IMPRESSION: Low density noted in right insular cortex and underlying white
matter is decreased compared to prior exam consistent with acute to
subacute infarction. There are now noted new rounded low densities
in the right periventricular white matter consistent with acute
infarctions. MRI may be performed further evaluation.

## 2020-01-12 MED ORDER — SODIUM CHLORIDE 0.9 % IV SOLN: INTRAVENOUS | Status: AC

## 2020-01-12 NOTE — Progress Notes (Signed)
Checked on pt. Very lethargic Patient states  he cannot communicate well. He states he is not in any pain right.  He is AXO4. Was able to tell me where he was, what year it was, his name.  Just very tired he states. Will continue to monitor.

## 2020-01-12 NOTE — Progress Notes (Signed)
Occupational Therapy Session Note  Patient Details  Name: Peter [name] MRN: [id number] Date of Birth: 1930-04-01  Today's Date: 01/12/2020 OT Individual Time: 5208-0223 OT Individual Time Calculation (min): 40 min    Short Term Goals: Week 1:  OT Short Term Goal 1 (Week 1): Pt will transfer to toilet/BSC with Max A of 1 OT Short Term Goal 2 (Week 1): Pt will static stand with no more than Mod A during ADL OT Short Term Goal 3 (Week 1): Pt will perform UB/LB bathing with Mod A with AE PRN OT Short Term Goal 4 (Week 1): Pt will perform sit to stand Mod A to decrease BOC  Skilled Therapeutic Interventions/Progress Updates:    Pt received supine, snoring but easily awoken. No c/o pain. Pt's brief was checked to ensure pt was continent, which he was. Pt completed rolling to the R with min A and then he transitioned into sitting EOB with max A. Once EOB pt maintained sitting balance with CGA initially, starting oral care and then began to require mod A for sitting balance 2/2 L lean/push. Pt was able to complete oral care with min A overall, mod cueing for initiation/termination. Pt completed face washing, while mod cueing was provided for midline orientation. Pt cued to use bed rail on the R to reduce L lean, which it did momentarily. Pt was returned to supine, requiring mod cueing and min A to scoot up in bed with bed in trend position. Pt was given several sips of nectar thickened water with min cueing. Pt was left supine with all needs met, bed alarm set.   Therapy Documentation Precautions:  Precautions Precautions: Fall Precaution Comments: hx syncope, L lean, hx dementia Restrictions Weight Bearing Restrictions: No   Therapy/Group: Individual Therapy  Curtis Sites 01/12/2020, 7:26 AM

## 2020-01-12 NOTE — Plan of Care (Signed)
  Problem: RH Memory Goal: LTG Patient will use memory compensatory aids to (SLP) Description: LTG:  Patient will use memory compensatory aids to recall biographical/new, daily complex information with cues (SLP) Flowsheets (Taken 01/12/2020 1555) LTG: Patient will use memory compensatory aids to (SLP): (added due to limited evaluation) Supervision Note: Added due to limited evaluation

## 2020-01-12 NOTE — Progress Notes (Signed)
[place] PHYSICAL MEDICINE & REHABILITATION PROGRESS NOTE   Subjective/Complaints: Patient continued to be very lethargic this morning. Discussed with Cherie PT and he has been requiring significantly increased assistance with therapy. CT Head ordered and does show new areas of infarction- discussed with neurology who recommended continued Aspirin and Brilinta.   ROS: + insomnia  Objective:   CT HEAD WO CONTRAST  Result Date: 01/12/2020 CLINICAL DATA:  Altered mental status. EXAM: CT HEAD WITHOUT CONTRAST TECHNIQUE: Contiguous axial images were obtained from the base of the skull through the vertex without intravenous contrast. COMPARISON:  January 06, 2020. FINDINGS: Brain: Mild diffuse cortical atrophy is noted. Mild chronic ischemic white matter disease is noted. No hemorrhage or mass lesion is noted. Ventricular size is within normal limits. No midline shift is noted. Low density noted in right insular cortex and underlying white matter is decreased compared to prior exam consistent with acute to subacute infarction. There are now noted new rounded low densities in the right periventricular white matter consistent with acute infarctions. Vascular: No hyperdense vessel or unexpected calcification. Skull: Normal. Negative for fracture or focal lesion. Sinuses/Orbits: No acute finding. Other: None. IMPRESSION: Low density noted in right insular cortex and underlying white matter is decreased compared to prior exam consistent with acute to subacute infarction. There are now noted new rounded low densities in the right periventricular white matter consistent with acute infarctions. MRI may be performed further evaluation. Electronically Signed   By: Marijo Conception M.D.   On: 01/12/2020 10:33   Recent Labs    01/10/20 0444 01/11/20 0701  WBC 8.8 8.4  HGB 8.4* 8.4*  HCT 26.4* 26.2*  PLT 275 292   Recent Labs    01/11/20 0701 01/12/20 0451  NA 139 140  K 3.4* 3.8  CL 101 103  CO2 26 26   GLUCOSE 79 98  BUN 28* 30*  CREATININE 1.34* 1.49*  CALCIUM 8.1* 8.3*    Intake/Output Summary (Last 24 hours) at 01/12/2020 1542 Last data filed at 01/12/2020 1300 Gross per 24 hour  Intake 640 ml  Output --  Net 640 ml        Physical Exam: Vital Signs Blood pressure (!) 146/55, pulse 71, temperature (!) 97.4 F (36.3 C), resp. rate 14, height 5\' 7"  (1.702 m), weight 71.8 kg, SpO2 93 %. Gen: no distress, normal appearing HEENT: oral mucosa pink and moist, NCAT Cardio: Reg rate Chest: normal effort, normal rate of breathing Abd: soft, non-distended Ext: no edema Skin: Clean and intact without signs of breakdown Neuro: Patient is alert in no acute distress.  Left facial droop. Mildly dysarthric.  Provides name and age.  Follows simple commands. 5/5 strength throughout.  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 Atrophy bilateral hand muscles.  Psych: Pt's affect is appropriate. Pt is cooperative  Assessment/Plan: 1. Functional deficits which require 3+ hours per day of interdisciplinary therapy in a comprehensive inpatient rehab setting.  Physiatrist is providing close team supervision and 24 hour management of active medical problems listed below.  Physiatrist and rehab team continue to assess barriers to discharge/monitor patient progress toward functional and medical goals  Care Tool:  Bathing              Bathing assist       Upper Body Dressing/Undressing Upper body dressing   What is the patient wearing?: Pull over shirt    Upper body assist Assist Level: Maximal Assistance - Patient 25 - 49% (per staff report)  Lower Body Dressing/Undressing Lower body dressing      What is the patient wearing?: Pants, Underwear/pull up     Lower body assist Assist for lower body dressing: Total Assistance - Patient < 25%     Toileting Toileting    Toileting assist Assist for toileting: Total Assistance - Patient < 25%     Transfers Chair/bed  transfer  Transfers assist     Chair/bed transfer assist level: Maximal Assistance - Patient 25 - 49%     Locomotion Ambulation   Ambulation assist      Assist level: 2 helpers Assistive device: Walker-rolling Max distance: 40   Walk 10 feet activity   Assist     Assist level: 2 helpers Assistive device: Walker-rolling   Walk 50 feet activity   Assist Walk 50 feet with 2 turns activity did not occur: Safety/medical concerns         Walk 150 feet activity   Assist Walk 150 feet activity did not occur: Safety/medical concerns         Walk 10 feet on uneven surface  activity   Assist Walk 10 feet on uneven surfaces activity did not occur: Safety/medical concerns         Wheelchair     Assist Will patient use wheelchair at discharge?: No             Wheelchair 50 feet with 2 turns activity    Assist            Wheelchair 150 feet activity     Assist          Blood pressure (!) 146/55, pulse 71, temperature (!) 97.4 F (36.3 C), resp. rate 14, height 5\' 7"  (1.702 m), weight 71.8 kg, SpO2 93 %.    Medical Problem List and Plan: 1.  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  2.  Antithrombotics: -DVT/anticoagulation: SCDs             -antiplatelet therapy: Aspirin 81 mg daily and Brilinta 90 mg twice daily x3 months then Brilinta alone 3. Pain Management: Neurontin 300 mg twice daily, Cymbalta 30 mg daily. Pain is well controlled.  4. Mood: Namenda 10 mg twice daily, Aricept 10 mg daily             -antipsychotic agents: N/A 5. Neuropsych: This patient is capable of making decisions on his own behalf. 6. Skin/Wound Care: Routine skin checks 7.  Fluids/Electrolytes/Nutrition: Routine in and outs with follow-up chemistries 8.  Dysphagia.  Regular consistency with nectar liquids.  Follow-up speech therapy 9.  Hypertension.  Norvasc 10 mg daily, Coreg 6.25 mg twice daily, Cardura 4 mg daily, hydralazine 100 mg 3 times daily, HCTZ 12.5 mg daily, Imdur 60 mg daily. Systolic BP elevated and diastolic BP low- monitor TID. Better controlled.  10.  History of prostate cancer.  Continue Proscar 5 mg daily. 11.  Diabetes mellitus.  Hemoglobin A1c 6.0.  Lantus insulin 20 units daily             11/29: CBG 214 last night. RD consult for dietary education.   11/30: improved control- high of 165.  12.  Diastolic congestive heart failure.  Monitor for any signs of fluid overload  13.  History of gout.  Allopurinol 100 mg daily.  Monitor for any gout flareups 14.  Acute on chronic anemia.  Follow-up outpatient GI services for future endoscopy colonoscopy.  Continue Protonix 40 mg daily 15.  Hyperlipidemia.  Lipitor 16.  CAD with PCI.  Continue aspirin and Brilinta for now follow-up outpatient cardiology service.  No current plan for ischemic work-up 17.  CKD stage III.  Follow-up chemistries. Cr 1.30 on 11/29. Up to 1.49 on 12/1. IV fluids tonight and repeat BMP tomorrow.  18. Hypokalemia: K+ 3.4- supplement 72meq- has since normalized  LOS: 2 days A FACE TO FACE EVALUATION WAS PERFORMED  Clide Deutscher Sonnie Bias 01/12/2020, 3:42 PM

## 2020-01-12 NOTE — Progress Notes (Signed)
CT of head results shows new rounded low densities in the right periventricular white matter consistent with acute infarction.  Low density noted in the right insular cortex and underlying white matter decreased compared to prior exam.  Neurology has been contacted to review scan and await recommendations.  Patient currently remains on aspirin as well as Brilinta.

## 2020-01-12 NOTE — Progress Notes (Signed)
[facility] ([facility]) Community Based Palliative Care   °    °This patient is enrolled in our palliative care services in the community.  [facility] will continue to follow for any discharge planning needs and to coordinate continuation of palliative care.   °If you have questions or need assistance, please call [phone] or contact the hospital Liaison listed on AMION.    ° °Thank you for the opportunity to participate in this patient’s care. °    °Chrislyn King, BSN, RN °[facility] Hospital Liaison   °[phone] (24h on call) ° °

## 2020-01-12 NOTE — Progress Notes (Addendum)
Speech Language Pathology Daily Session Note  Patient Details  Name: Peter [name] MRN: [id number] Date of Birth: 08-13-1930  Today's Date: 01/12/2020 SLP Individual Time: 9562-1308 SLP Individual Time Calculation (min): 29 min  Short Term Goals: Week 1: SLP Short Term Goal 1 (Week 1): Pt will participate in continued cognitive lingustic assessment. SLP Short Term Goal 2 (Week 1): Pt will demonstrate use of speech intelligibility skills at the phrase level with 80% accuarcy with min A verbal cues. SLP Short Term Goal 3 (Week 1): Pt will consume regular textures and nectar thick liquids with mod A verbal cues for swallow strategies. SLP Short Term Goal 4 (Week 1): Pt will be alert and participate in trials of thin liquids with minimal overt s/s aspiration over x1 prior to placement on water protocol. SLP Short Term Goal 5 (Week 1): Pt will recall daily and novel events with mod A verbal cues for use of external aids.  Skilled Therapeutic Interventions:Skilled ST services focused on cognitive skills. Pt required max A multimodal cues , including repositioning in bed and col compress to become alert. Pt demonstrated increased alertness in 10 minute interval with mod A verbal cues and continuous hand holding for alertness. Pt was squeezing SLP hands and asked " why am I squeezing your hand? ", however pt was only able to stop motor pattern on squeezing for 30 seconds and then began to squeeze again. SLP facilitated continued cognitive linguistic assessment, with subsections of the Cognistat during periods of alertness. Pt demonstrated sustained attention to listing up to 7 digits numbers with cues for remain alert during subtest, pt demonstrated severe deficits in delayed recall 0/4 words, moderate deficits in calculations, [facility] abstract language, WFL in repeating of phrase/sentences. Pt's speech intelligibility increased during periods of alertness 80% intelligible at phrase level, however during  majority of session (fading alertness in 1 minute to 30 second intervals) pt demonstrated 60% intelligibility at phrase level. Pt requested to use the bathroom and was able to press call bell with verbal prompt, however transport team arrived. Pt missed 15 minutes of treatment for CT scan. Pt was transported out of room for CT scan SLP recommends to continue skilled services. SLP added cognitive goals.      Pain Pain Assessment Pain Scale: 0-10 Pain Score: 0-No pain Pain Type: Acute pain Pain Location: Foot Pain Intervention(s): Medication (See eMAR)  Therapy/Group: Individual Therapy  Patric Vanpelt  [facility] 01/12/2020, 10:09 AM

## 2020-01-12 NOTE — Progress Notes (Signed)
Physical Therapy Session Note  Patient Details  Name: Peter [name] MRN: [id number] Date of Birth: [birth year]  Today's Date: 01/12/2020 PT Individual Time: 0800-0900 PT Individual Time Calculation: 60 min  Short Term Goals: Week 1:  PT Short Term Goal 1 (Week 1): Pt will ambulate 57ft w/RW and mod assist PT Short Term Goal 2 (Week 1): Pt will transfer bed to/from wc w/RW and mod assist PT Short Term Goal 3 (Week 1): Pt will maintain static sitting balance on edge of bed w/min assist  Skilled Therapeutic Interventions/Progress Updates:     [name]. Patient alert and agreeable to PT session. Patient reported mild R toe/foot pain during session, RN made aware. PT provided repositioning, rest breaks, and distraction as pain interventions throughout session.  Patient was incontinent of bladder at beginning of session.   Therapeutic Activity: Bed Mobility: Patient performed rolling R/L with min-mod A for doffing incontinence brief and performing peri-care. Provided verbal and tactile cues for technique and sequencing. Noted increased extensor tone in R lower extremity when assist patient to bend his R knee. Patient stated he needed to have a BM. No BSC in the room and patient reported urgency for BM. Placed bed pan under patient with total A. Retrieved drop arm BSC while patient on the bed pan. Upon PT return patient stated he had finished. Initiated peri-care and patient then incontinent of bowl in side-lying. Continued peri-care and donned incontinence brief with total A. During, had patient reach to visual targets with L upper extremity form improved ROM and motor control. He performed supine to/from sit with mod A with increased time for initiation. Provided verbal cues for rolling R then setting R elbow to push up to sittng. Patient sat EOB with CGA-close supervision.   MD rounded with patient EOB. Informed MD of changes in mobility noted at evaluation yesterday from 2  days ago on acute, decline from min-mod A to max A +2 for transfers and decreased arousal/activity tolerance, and increased tone in R lower extremity. Patient returned to lying then boosted up in the bed with total A.  Patient in bed asleep at end of session with breaks locked, bed alarm set, and all needs within reach.   Therapy Documentation Precautions:  Precautions Precautions: Fall Precaution Comments: hx syncope, L lean, hx dementia Restrictions Weight Bearing Restrictions: No   Therapy/Group: Individual Therapy  Magie Ciampa L Kaylib Furness PT, DPT  01/12/2020, 3:36 PM

## 2020-01-12 NOTE — Progress Notes (Signed)
Spoke with neurology after CT scan reviewed recommendations are to continue plan of care with aspirin and Brilinta.  No MRI needed at this time.
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
## 2020-01-12 NOTE — Progress Notes (Signed)
Patient ID: Peter [name], male   DOB: 09/04/1930, 86 y.o.   MRN: [id number]  SW called pt dtr [name] to inform that attending agreeable to pt dtr and wife coming in to visit. Reports that she spoke with assistant nursing director yesterday who gave the approval. SW informed there will be follow-up next week after team conference with more details on pt progress and d/c date.   [name], MSW, [place] Office: [phone] Cell: [phone] Fax: [phone]

## 2020-01-12 NOTE — Progress Notes (Signed)
Occupational Therapy Session Note  Patient Details  Name: Peter [name] MRN: [id number] Date of Birth: Jun 14, 1930  Today's Date: 01/12/2020 OT Individual Time: 1515-1600 OT Individual Time Calculation (min): 45 min    Short Term Goals: Week 1:  OT Short Term Goal 1 (Week 1): Pt will transfer to toilet/BSC with Max A of 1 OT Short Term Goal 2 (Week 1): Pt will static stand with no more than Mod A during ADL OT Short Term Goal 3 (Week 1): Pt will perform UB/LB bathing with Mod A with AE PRN OT Short Term Goal 4 (Week 1): Pt will perform sit to stand Mod A to decrease BOC  Skilled Therapeutic Interventions/Progress Updates:    1;1. Pt received in bed agreeable to OT after significant time to arouse. Pt supine>sitting with EOB with MAX A for bathing and dressing. Focus of session on midline orientation during functional tasks at EOB. Pt completes UB bathing with CGA for sitting balance and LB bathing with up to MAX A at sit to stand level. Pt dons shirt iht MOD A d/t fatigue sitting and requires leaning rest break onto R elbow. Pt able to thread 1 leg into pants and OT advances pants past hips after lotion application with BUE for NMR. Exited session with pt seated in bed, exit alarm on and call light in reach   Therapy Documentation Precautions:  Precautions Precautions: Fall Precaution Comments: hx syncope, L lean, hx dementia Restrictions Weight Bearing Restrictions: No General:   Vital Signs: Therapy Vitals Temp: (!) 97.4 F (36.3 C) Pulse Rate: 71 Resp: 14 BP: (!) 146/55 Patient Position (if appropriate): Lying Oxygen Therapy SpO2: 93 % O2 Device: Room Air Pain:   ADL: ADL Upper Body Dressing: Maximal assistance Lower Body Dressing: Dependent Where Assessed-Lower Body Dressing: Edge of bed Toileting: Dependent Where Assessed-Toileting: Bed level Vision   Perception    Praxis   Exercises:   Other Treatments:     Therapy/Group: Individual  Therapy  Tonny Branch 01/12/2020, 3:23 PM

## 2020-01-13 ENCOUNTER — Inpatient Hospital Stay (HOSPITAL_COMMUNITY): Payer: Medicare Other | Admitting: Speech Pathology

## 2020-01-13 ENCOUNTER — Inpatient Hospital Stay (HOSPITAL_COMMUNITY): Payer: Medicare Other

## 2020-01-13 ENCOUNTER — Other Ambulatory Visit: Payer: Self-pay | Admitting: Family Medicine

## 2020-01-13 DIAGNOSIS — G63 Polyneuropathy in diseases classified elsewhere: Secondary | ICD-10-CM

## 2020-01-13 DIAGNOSIS — R7989 Other specified abnormal findings of blood chemistry: Secondary | ICD-10-CM

## 2020-01-13 DIAGNOSIS — I10 Essential (primary) hypertension: Secondary | ICD-10-CM | POA: Diagnosis not present

## 2020-01-13 DIAGNOSIS — E119 Type 2 diabetes mellitus without complications: Secondary | ICD-10-CM

## 2020-01-13 DIAGNOSIS — F321 Major depressive disorder, single episode, moderate: Secondary | ICD-10-CM

## 2020-01-13 DIAGNOSIS — I63511 Cerebral infarction due to unspecified occlusion or stenosis of right middle cerebral artery: Secondary | ICD-10-CM | POA: Diagnosis not present

## 2020-01-13 LAB — BASIC METABOLIC PANEL
Anion gap: 11 (ref 5–15)
BUN: 28 mg/dL — ABNORMAL HIGH (ref 8–23)
CO2: 26 mmol/L (ref 22–32)
Calcium: 8.6 mg/dL — ABNORMAL LOW (ref 8.9–10.3)
Chloride: 106 mmol/L (ref 98–111)
Creatinine, Ser: 1.46 mg/dL — ABNORMAL HIGH (ref 0.61–1.24)
GFR, Estimated: 46 mL/min — ABNORMAL LOW (ref >60)
Glucose, Bld: 87 mg/dL (ref 70–99)
Potassium: 3.6 mmol/L (ref 3.5–5.1)
Sodium: 143 mmol/L (ref 135–145)

## 2020-01-13 LAB — GLUCOSE, CAPILLARY
Glucose-Capillary: 88 mg/dL (ref 70–99)
Glucose-Capillary: 101 mg/dL — ABNORMAL HIGH (ref 70–99)
Glucose-Capillary: 179 mg/dL — ABNORMAL HIGH (ref 70–99)
Glucose-Capillary: 215 mg/dL — ABNORMAL HIGH (ref 70–99)

## 2020-01-13 MED ORDER — POTASSIUM CHLORIDE CRYS ER 20 MEQ PO TBCR
20.0000 meq | EXTENDED_RELEASE_TABLET | Freq: Every day | ORAL | Status: DC
  Administered 2020-01-14 – 2020-02-04 (×22): 20 meq via ORAL
  Filled 2020-01-13 (×24): qty 1

## 2020-01-13 MED ORDER — SODIUM CHLORIDE 0.9 % IV SOLN: INTRAVENOUS | Status: AC

## 2020-01-13 NOTE — Progress Notes (Addendum)
Speech Language Pathology Daily Session Notes  Patient Details  Name: Peter [name] MRN: [id number] Date of Birth: 1930-08-21  Today's Date: 01/13/2020  Session 1: SLP Individual Time: 4098-1191 SLP Individual Time Calculation (min): 30 min   Session 2: SLP Individual Time: 1355-1450 SLP Individual Time Calculation (min): 55 min   Short Term Goals: Week 1: SLP Short Term Goal 1 (Week 1): Pt will participate in continued cognitive lingustic assessment. SLP Short Term Goal 2 (Week 1): Pt will demonstrate use of speech intelligibility skills at the phrase level with 80% accuarcy with min A verbal cues. SLP Short Term Goal 3 (Week 1): Pt will consume regular textures and nectar thick liquids with mod A verbal cues for swallow strategies. SLP Short Term Goal 4 (Week 1): Pt will be alert and participate in trials of thin liquids with minimal overt s/s aspiration over x1 prior to placement on water protocol. SLP Short Term Goal 5 (Week 1): Pt will recall daily and novel events with mod A verbal cues for use of external aids.  Skilled Therapeutic Interventions:  Session 1: Skilled treatment session focused on speech goals. Upon arrival, patient was awake in bed. SLP facilitated session by providing education regarding speech intelligibility strategies. Patient required Mod verbal cues for utilization of strategies at the sentence level to achieve ~80% intelligibility. Patient left upright in bed with alarm on and all needs within reach. Continue with current plan of care.   Session 2: Skilled treatment session focused on cognitive and dysphagia goals. Upon arrival, patient was asleep in bed and slow to arouse despite Max encouragement and environmental modifications. Patient sat EOB to maximize arousal in order to focus on self-feeding and PO intake. Patient consumed Dys. 1 textures and nectar-thick liquids but demonstrated anterior spillage with prolonged AP transit and oral residue resulting in  an intermittent wet vocal quality. Patient also demonstrated prolonged mastication with solid textures. Due to patient's lethargy, recommend patient downgrade to Dys. 2 textures and continue nectar-thick liquids with full supervision. Patient transferred back to bed at end of session and left with alarm on and all needs within reach. Continue with current plan of care.   Pain Pain Assessment Pain Scale: 0-10 Pain Score: 0-No pain  Therapy/Group: Individual Therapy  Laden Fieldhouse 01/13/2020, 11:55 AM

## 2020-01-13 NOTE — Progress Notes (Signed)
Physical Therapy Session Note  Patient Details  Name: Peter [name] MRN: [id number] Date of Birth: [birth year]  Today's Date: 01/13/2020 PT Individual Time: 1015-1130 PT Individual Time Calculation (min): 75 min   Short Term Goals: Week 1:  PT Short Term Goal 1 (Week 1): Pt will ambulate 61ft w/RW and mod assist PT Short Term Goal 2 (Week 1): Pt will transfer bed to/from wc w/RW and mod assist PT Short Term Goal 3 (Week 1): Pt will maintain static sitting balance on edge of bed w/min assist  Skilled Therapeutic Interventions/Progress Updates:     Patient in bed asleep upon PT arrival. Patient easily aroused, but remained lethargic throughout session and agreeable to PT session. Patient reported moderate R toe and knee pain during session, RN made aware. PT provided repositioning, rest breaks, and distraction as pain interventions throughout session.   [name] called at beginning of session. PT spoke with her on the phone with the patient between wakefulness and sleep during conversation. Discussed patient's change in functional status since admission to CIR. Daughter reports that the patient usually does not get up until ~10am at baseline, will adjust therapy schedule to improve arousal and success with mobility to start after 10am. Also, discussed d/c planning, patient's daughter wishes to have the patient d/c home to be with his wife. Reports that min A level would easily be able to be provided by a 24/7 care attendant, however, is willing to find increased skilled assist for a higher level of assist in order to have him come home if necessary. Informed the patient and his daughter about rehab team conference on Tuesday to determine ELOS, goals, and discuss medical and therapy status/progress to determine best POC for the patient. His daughter expressed concerns about results from his chest x-ray and asked that he perform IS frequently and sit OOB as much as he is able to. Informed  nursing and therapy staff about IS and plan to trial patient sitting in w/c at nurses station for supervision at end of session.  Finally, discussed patient's pain management and R lower extremity "stiffness" at baseline. RN made aware and to follow up with his daughter later today.   Therapeutic Activity: Bed Mobility: Noted patient was incontinent of bowl and bladder in the bed with soiled sheets. Patient performed rolling R/L x3 with min A with cues for initiation of upper/lower extremity movement and technique. Performed peri-care and lower body clothing management with total A bed level. Removed bed linens during rolling. Patient sat EOB from L side-lying on a clean towel with mod A. Provided verbal cues for setting his L elbow and using his R hand to push to sit up. Patient sat EOB with mod A due to increased L lean in sitting. Patient able to self correct with visual target on R for his R shoulder to touch, but unable to maintain >4-5 sec at a time requiring CGA-mod A for sitting balance while performing upper body bathing and donning pants and a shirt with max-total a for energy/time management.  Transfers: Patient performed sit to/from stand with mod-max A of 1 from an elevated bed with PT blocking his L knee to prevent buckling and supporting his L side due to mild L lean in standing. +2 assist for pulling patient's pants up in standing. He performed step pivot to w/c and reciprocal scooting for improved positioning in the chair with max A and cues for sequencing and tehcnique.  Patient transported to the nurses station in the w/c  for full supervision for safety due to decreased trunk control and intermittent lethargy. RN made aware and agreed to return patient to bed with +2 assist if patient demonstrates unsafe positioning in the w/c.  Patient in w/c handed off to charge nurse and secretary at end of session with breaks locked, seat belt alarm set, and all needs within reach.    Therapy  Documentation Precautions:  Precautions Precautions: Fall Precaution Comments: hx syncope, L lean, hx dementia Restrictions Weight Bearing Restrictions: No   Therapy/Group: Individual Therapy  Keliah Harned L Zaydah Nawabi PT, DPT  01/13/2020, 5:32 PM

## 2020-01-13 NOTE — Telephone Encounter (Signed)
Patients wife is calling in about a call she received. Was returning a call to [name]. EM

## 2020-01-13 NOTE — Progress Notes (Signed)
Occupational Therapy Session Note  Patient Details  Name: Peter [name] MRN: [id number] Date of Birth: 08-09-1930  Today's Date: 01/13/2020 OT Individual Time: 1505-1605 OT Individual Time Calculation (min): 60 min    Short Term Goals: Week 1:  OT Short Term Goal 1 (Week 1): Pt will transfer to toilet/BSC with Max A of 1 OT Short Term Goal 2 (Week 1): Pt will static stand with no more than Mod A during ADL OT Short Term Goal 3 (Week 1): Pt will perform UB/LB bathing with Mod A with AE PRN OT Short Term Goal 4 (Week 1): Pt will perform sit to stand Mod A to decrease BOC  Skilled Therapeutic Interventions/Progress Updates:    1:1. Pt received in bed asleep but aroused easily, especially when daughter, Peter [name], entered the room. Pt interactive with daughter throughout session. [name] is an OT and assisted as +2 throughout session for hygiene and clothing management. Pt with malodorous brief and upon transferring to w/c with MAX A apparent pt had a BM. Pt transfers to [facility] as stated above with grab bar on R and has a small BM and bladder movement into toilet. Pt with mucous-like consistency of stool (RN alerted) and sacral patch changed (RN alerted to adjust as needed). Pt stood 5x on toilet for posterior hygeine and peri care with mod increasing to total A d/t increased L lean/push with fatigue. Cues to lean L towards wall with varying success as well as for posture provided throughout. At sink, L inattention apparent as well as decreased continuation skill as pt would reach for toothbrush on L of sink after being cued and stop pulling it off the charger midway through motion. Pt anxiously wanting to use mouthwash, however education on aspiration provided and waiting to SLP clears use. Pt verbalized understanding. Pt requried MOD A to thread LLE and pull shirt don back when donning shirt seated. Pt left in w/c with RN and daughter in room. All needs met.  Therapy Documentation Precautions:   Precautions Precautions: Fall Precaution Comments: hx syncope, L lean, hx dementia Restrictions Weight Bearing Restrictions: No General:   Vital Signs: Therapy Vitals Temp: (!) 97.5 F (36.4 C) Pulse Rate: 64 Resp: 14 BP: (!) 155/54 Patient Position (if appropriate): Sitting Oxygen Therapy SpO2: 100 % O2 Device: Room Air Pain:   ADL: ADL Upper Body Dressing: Maximal assistance Lower Body Dressing: Dependent Where Assessed-Lower Body Dressing: Edge of bed Toileting: Dependent Where Assessed-Toileting: Bed level Vision   Perception    Praxis   Exercises:   Other Treatments:     Therapy/Group: Individual Therapy  Tonny Branch 01/13/2020, 4:33 PM

## 2020-01-13 NOTE — IPOC Note (Signed)
Overall Plan of Care [facility]) Patient Details Name: Peter [name] MRN: [id number] DOB: 1930-11-02  Admitting Diagnosis: Right middle cerebral artery stroke [facility])  Hospital Problems: Principal Problem:   Right middle cerebral artery stroke [facility]) Active Problems:   Malnutrition of moderate degree     Functional Problem List: Nursing Bowel, Bladder, Endurance, Medication Management, Safety, Skin Integrity  PT Balance, Endurance, Motor, Perception, Safety, Sensory, Skin Integrity  OT Balance, Cognition, Endurance, Motor, Perception, Safety, Sensory  SLP Cognition  TR         Basic ADLs: OT Grooming, Bathing, Dressing, Toileting     Advanced  ADLs: OT       Transfers: PT Bed Mobility, Bed to Chair, Car, Manufacturing systems engineer, Metallurgist: PT Ambulation, Emergency planning/management officer, Stairs     Additional Impairments: OT None  SLP Swallowing, Communication, Social Cognition expression Attention  TR      Anticipated Outcomes Item Anticipated Outcome  Self Feeding Set up  Swallowing  Mod I   Basic self-care  Min A  Toileting  Min A   Bathroom Transfers CGA  Bowel/Bladder  manage bowel and bladder with min assist  Transfers  cga  Locomotion  cga  Communication  Mod I  Cognition  Mod I  Pain     Safety/Judgment  maintain safety with cues/reminders   Therapy Plan: PT Intensity: Minimum of 1-2 x/day ,45 to 90 minutes PT Frequency: Total of 15 hours over 7 days of combined therapies PT Duration Estimated Length of Stay: 17-21 days OT Intensity: Minimum of 1-2 x/day, 45 to 90 minutes OT Frequency: Total of 15 hours over 7 days of combined therapies OT Duration/Estimated Length of Stay: 17-21 days SLP Intensity: Minumum of 1-2 x/day, 30 to 90 minutes SLP Frequency: 3 to 5 out of 7 days SLP Duration/Estimated Length of Stay: 17-21 days   Due to the current state of emergency, patients may not be receiving their 3-hours of Medicare-mandated  therapy.   Team Interventions: Nursing Interventions Patient/Family Education, Bladder Management, Bowel Management, Disease Management/Prevention, Pain Management, Medication Management, Skin Care/Wound Management, Discharge Planning  PT interventions Ambulation/gait training, DME/adaptive equipment instruction, Neuromuscular re-education, Psychosocial support, Stair training, UE/LE Strength taining/ROM, Wheelchair propulsion/positioning, Training and development officer, Discharge planning, Therapeutic Activities, UE/LE Coordination activities, Skin care/wound management, Cognitive remediation/compensation, Disease management/prevention, Functional mobility training, Patient/family education, Therapeutic Exercise, Splinting/orthotics  OT Interventions Balance/vestibular training, Discharge planning, Functional electrical stimulation, Pain management, Self Care/advanced ADL retraining, Therapeutic Activities, UE/LE Coordination activities, Visual/perceptual remediation/compensation, Therapeutic Exercise, Skin care/wound managment, Patient/family education, Functional mobility training, Disease mangement/prevention, Cognitive remediation/compensation, Community reintegration, Engineer, drilling, Neuromuscular re-education, Psychosocial support, UE/LE Strength taining/ROM, Wheelchair propulsion/positioning  SLP Interventions Dysphagia/aspiration precaution training, Cognitive remediation/compensation, English as a second language teacher, Functional tasks, Patient/family education, Internal/external aids, Speech/Language facilitation  TR Interventions    SW/CM Interventions Discharge Planning, Psychosocial Support, Patient/Family Education   Barriers to Discharge MD  Medical stability  Nursing Decreased caregiver support, Incontinence    PT Other (comments) dementia, skin integrity  OT      SLP      SW Decreased caregiver support, Lack of/limited family support     Team Discharge Planning: Destination:  PT-Home ,OT- Home (with PCA from 12-6 daily) , SLP-Home Projected Follow-up: PT-Home health PT, OT-  Home health OT, SLP-24 hour supervision/assistance, Home Health SLP (TBD) Projected Equipment Needs: PT-To be determined, OT- To be determined, SLP-None recommended by SLP Equipment Details: PT-need to be further investigated, OT-  Patient/family involved in discharge planning: PT- Patient,  OT-Patient, SLP-Patient, Family member/caregiver  MD ELOS: 17-21 days Medical Rehab Prognosis:  Excellent Assessment: The patient has been admitted for CIR therapies with the diagnosis of right MCA infarct. The team will be addressing functional mobility, strength, stamina, balance, safety, adaptive techniques and equipment, self-care, bowel and bladder mgt, patient and caregiver education, NMR, communication, cognition, community reentry. Goals have been set at supervision to [place] with mobility and self-care and mod I with cognition and communication.   Due to the current state of emergency, patients may not be receiving their 3 hours per day of Medicare-mandated therapy.    Meredith Staggers, MD, FAAPMR      See Team Conference Notes for weekly updates to the plan of care

## 2020-01-13 NOTE — Progress Notes (Signed)
Pt participated in therapy this morning, tired after getting in chair. Pt responding to commands AxO4. Patient back in bed, did not want to eat lunch or drink anything. Vitals taken. PA and doctor made aware. No new orders at this time. Will continue to monitor.  Dayna Ramus

## 2020-01-13 NOTE — Progress Notes (Signed)
[place] PHYSICAL MEDICINE & REHABILITATION PROGRESS NOTE   Subjective/Complaints: Pt more alert this morning. Tells me he's not sleeping well. Feels restless and also right foot hurts  ROS: Patient denies fever, rash, sore throat, blurred vision, nausea, vomiting, diarrhea, cough, shortness of breath or chest pain,  headache, or mood change.    Objective:   CT HEAD WO CONTRAST  Result Date: 01/12/2020 CLINICAL DATA:  Altered mental status. EXAM: CT HEAD WITHOUT CONTRAST TECHNIQUE: Contiguous axial images were obtained from the base of the skull through the vertex without intravenous contrast. COMPARISON:  January 06, 2020. FINDINGS: Brain: Mild diffuse cortical atrophy is noted. Mild chronic ischemic white matter disease is noted. No hemorrhage or mass lesion is noted. Ventricular size is within normal limits. No midline shift is noted. Low density noted in right insular cortex and underlying white matter is decreased compared to prior exam consistent with acute to subacute infarction. There are now noted new rounded low densities in the right periventricular white matter consistent with acute infarctions. Vascular: No hyperdense vessel or unexpected calcification. Skull: Normal. Negative for fracture or focal lesion. Sinuses/Orbits: No acute finding. Other: None. IMPRESSION: Low density noted in right insular cortex and underlying white matter is decreased compared to prior exam consistent with acute to subacute infarction. There are now noted new rounded low densities in the right periventricular white matter consistent with acute infarctions. MRI may be performed further evaluation. Electronically Signed   By: Marijo Conception M.D.   On: 01/12/2020 10:33   Recent Labs    01/11/20 0701  WBC 8.4  HGB 8.4*  HCT 26.2*  PLT 292   Recent Labs    01/12/20 0451 01/13/20 0608  NA 140 143  K 3.8 3.6  CL 103 106  CO2 26 26  GLUCOSE 98 87  BUN 30* 28*  CREATININE 1.49* 1.46*  CALCIUM  8.3* 8.6*    Intake/Output Summary (Last 24 hours) at 01/13/2020 0957 Last data filed at 01/13/2020 0900 Gross per 24 hour  Intake 190 ml  Output --  Net 190 ml        Physical Exam: Vital Signs Blood pressure (!) 164/52, pulse 64, temperature 98.3 F (36.8 C), temperature source Oral, resp. rate 18, height 5\' 7"  (1.702 m), weight 71.8 kg, SpO2 96 %. Constitutional: No distress . Vital signs reviewed. HEENT: EOMI, oral membranes moist Neck: supple Cardiovascular: RRR without murmur. No JVD    Respiratory/Chest: CTA Bilaterally without wheezes or rales. Normal effort    GI/Abdomen: BS +, non-tender, non-distended Ext: no clubbing, cyanosis, or edema Psych: pleasant and cooperative, a little flat Skin: right 2nd toe ischemic throughout its length.  Neuro: Patient is alert in no acute distress.  L CNVII, dysarthric.  Provides name and age.  Follows simple commands. 5/5 strength throughout.  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 Atrophy bilateral hand muscles. Missing toes.    Assessment/Plan: 1. Functional deficits which require 3+ hours per day of interdisciplinary therapy in a comprehensive inpatient rehab setting.  Physiatrist is providing close team supervision and 24 hour management of active medical problems listed below.  Physiatrist and rehab team continue to assess barriers to discharge/monitor patient progress toward functional and medical goals  Care Tool:  Bathing    Body parts bathed by patient: Right arm, Left arm, Chest, Abdomen, Front perineal area, Right upper leg, Left upper leg, Face   Body parts bathed by helper: Buttocks, Right lower leg, Left lower leg  Bathing assist Assist Level: Maximal Assistance - Patient 24 - 49%     Upper Body Dressing/Undressing Upper body dressing   What is the patient wearing?: Pull over shirt    Upper body assist Assist Level: Moderate Assistance - Patient 50 - 74%    Lower Body  Dressing/Undressing Lower body dressing      What is the patient wearing?: Pants, Incontinence brief     Lower body assist Assist for lower body dressing: Moderate Assistance - Patient 50 - 74%     Toileting Toileting    Toileting assist Assist for toileting: Total Assistance - Patient < 25%     Transfers Chair/bed transfer  Transfers assist     Chair/bed transfer assist level: Maximal Assistance - Patient 25 - 49%     Locomotion Ambulation   Ambulation assist      Assist level: 2 helpers Assistive device: Walker-rolling Max distance: 40   Walk 10 feet activity   Assist     Assist level: 2 helpers Assistive device: Walker-rolling   Walk 50 feet activity   Assist Walk 50 feet with 2 turns activity did not occur: Safety/medical concerns         Walk 150 feet activity   Assist Walk 150 feet activity did not occur: Safety/medical concerns         Walk 10 feet on uneven surface  activity   Assist Walk 10 feet on uneven surfaces activity did not occur: Safety/medical concerns         Wheelchair     Assist Will patient use wheelchair at discharge?: No             Wheelchair 50 feet with 2 turns activity    Assist            Wheelchair 150 feet activity     Assist          Blood pressure (!) 164/52, pulse 64, temperature 98.3 F (36.8 C), temperature source Oral, resp. rate 18, height 5\' 7"  (1.702 m), weight 71.8 kg, SpO2 96 %.    Medical Problem List and Plan: 1.  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 CT 12/1 without acute changes. Likely sleep related.  2.  Antithrombotics: -DVT/anticoagulation: SCDs             -antiplatelet therapy: Aspirin 81 mg daily and Brilinta 90 mg twice daily x3 months then Brilinta  alone 3. Pain Management: Neurontin 300 mg twice daily, Cymbalta 30 mg daily.   -has pain in right foot at night  -will schedule tylenol 4. Mood: Namenda 10 mg twice daily, Aricept 10 mg daily             -antipsychotic agents: N/A 5. Neuropsych: This patient is capable of making decisions on his own behalf. 6. Skin/Wound Care: Routine skin checks  -kerlix wrap right foot to keep ischemic toe from catching7. Fluids/Electrolytes/Nutrition: Routine in and outs with follow-up chemistries 8.  Dysphagia.  Regular consistency with nectar liquids.  Follow-up speech therapy 9.  Hypertension.  Norvasc 10 mg daily, Coreg 6.25 mg twice daily, Cardura 4 mg daily, hydralazine 100 mg 3 times daily, HCTZ 12.5 mg daily, Imdur 60 mg daily. Systolic BP elevated and diastolic  BP low- monitor TID.  -12/2 bp controlled 10.  History of prostate cancer.  Continue Proscar 5 mg daily. 11.  Diabetes mellitus.  Hemoglobin A1c 6.0.  Lantus insulin 20 units daily             11/29: CBG 214 last night. RD consult for dietary education.   11/30-12/2: improved control- high of 168.  12.  Diastolic congestive heart failure.  Monitor for any signs of fluid overload  -need weight QOD Filed Weights   01/10/20 1448  Weight: 71.8 kg    13.  History of gout.  Allopurinol 100 mg daily.  Monitor for any gout flareups 14.  Acute on chronic anemia.  Follow-up outpatient GI services for future endoscopy colonoscopy.  Continue Protonix 40 mg daily 15.  Hyperlipidemia.  Lipitor 16.  CAD with PCI.  Continue aspirin and Brilinta for now follow-up outpatient cardiology service.  No current plan for ischemic work-up 17.  CKD stage III.  Follow-up chemistries. Cr 1.30 on 11/29. Up to 1.49 on 12/1.   -IVF started 12/1--repeat again tonight  -BUN/CR stable today  18. Hypokalemia: K+ 3.6- supplement 68meq--->continue 32meq daily    LOS: 3 days A FACE TO FACE EVALUATION WAS PERFORMED  Meredith Staggers 01/13/2020, 9:57 AM

## 2020-01-14 ENCOUNTER — Inpatient Hospital Stay (HOSPITAL_COMMUNITY): Payer: Medicare Other

## 2020-01-14 ENCOUNTER — Telehealth: Payer: Self-pay | Admitting: Family Medicine

## 2020-01-14 DIAGNOSIS — I63511 Cerebral infarction due to unspecified occlusion or stenosis of right middle cerebral artery: Secondary | ICD-10-CM | POA: Diagnosis not present

## 2020-01-14 DIAGNOSIS — R7989 Other specified abnormal findings of blood chemistry: Secondary | ICD-10-CM | POA: Diagnosis not present

## 2020-01-14 DIAGNOSIS — E119 Type 2 diabetes mellitus without complications: Secondary | ICD-10-CM | POA: Diagnosis not present

## 2020-01-14 DIAGNOSIS — I10 Essential (primary) hypertension: Secondary | ICD-10-CM | POA: Diagnosis not present

## 2020-01-14 LAB — GLUCOSE, CAPILLARY
Glucose-Capillary: 71 mg/dL (ref 70–99)
Glucose-Capillary: 154 mg/dL — ABNORMAL HIGH (ref 70–99)
Glucose-Capillary: 285 mg/dL — ABNORMAL HIGH (ref 70–99)
Glucose-Capillary: 198 mg/dL — ABNORMAL HIGH (ref 70–99)

## 2020-01-14 MED ORDER — SODIUM CHLORIDE 0.9 % IV SOLN: INTRAVENOUS | Status: DC

## 2020-01-14 NOTE — Telephone Encounter (Signed)
Spoke to the wife explained to her that we would do a hospital followup with the patient when he gets out and at the time of the appt we would discuss the medication refills with him. EM  She understood the conversation  And will call to schedule once he gets out of the hospital

## 2020-01-14 NOTE — Telephone Encounter (Signed)
Pt called in wanted to get a refill for  Diuloxetine(cymbalta)

## 2020-01-14 NOTE — Progress Notes (Signed)
Physical Therapy Session Note  Patient Details  Name: Peter [name] MRN: [id number] Date of Birth: [birth year]  Today's Date: 01/14/2020 PT Individual Time: 1000-1040 PT Individual Time Calculation (min): 40 min and 45 mins and Today's Date: 01/14/2020 PT Missed Time: 35 Minutes Missed Time Reason: Patient fatigue  Short Term Goals: Week 1:  PT Short Term Goal 1 (Week 1): Pt will ambulate 34ft w/RW and mod assist PT Short Term Goal 2 (Week 1): Pt will transfer bed to/from wc w/RW and mod assist PT Short Term Goal 3 (Week 1): Pt will maintain static sitting balance on edge of bed w/min assist  Skilled Therapeutic Interventions/Progress Updates:   Session 1: Patient received asleep in bed, difficult to rouse due to level of lethargy. With gentle sternal rub, patient about to rouse long enough to state that he had to go to the bathroom. 2nd assist requested of NT due to patients level of fatigue. It was determined that patient was unable to remain awake long enough to safely transfer to [facility], even with use of Stedy. PT opting to use bedpan. By the time patient able to roll onto bedpan, he had already been incontinent of both bladder and bowel. Patient able to roll in bed, using bed rails, with MinA for initiation. Multiple bouts of rolling needed for complete peri-hygiene and changing of bedsheets. Patient continuously falling asleep throughout this task requiring gentle sternal rub each time to rouse. TotalA provided for peri-hygiene. It was determined that given patients current level of arousal, it would be unsafe to sit edge of bed to change t-shirt. With back of bed elevated, patient able to sit away from back of bed with ModA and MaxA to don clean t-shirt. Patient remaining in bed, unable to participate in further meaningful therapy at this time. Bed alarm on, call light within reach.    Session 2: Patient received sitting up in wc at nurses station, agreeable to PT. He reports "discomfort" in  his R foot/2nd digit, but declines pain interventions. Patient appearing much more aware and alert this afternoon compared to AM session. Able to engage in meaningful conversation and was oriented x4. PT propelling patient to therapy gym in wc for time management and energy conservation. Sit <>stands with mirror for visual feedback on posture. He required initially MaxA for sit <>stand progressing to [place] over 4 trials. Dynamic standing balance completed placing pegs in board according to picture. CGA/MinA provided for postural support, increasing with fatigue to consistent MinA, ModA needed for accurate completion of peg board. Patient ambulating ~128ft with [name] and wc follow for safety. Flexed posture with decreased B step length noted. Minimal B knee flexion noted through swing phase. Patient remaining up in wc, seatbelt alarm on, at nurses station for safety.   Therapy Documentation Precautions:  Precautions Precautions: Fall Precaution Comments: hx syncope, L lean, hx dementia Restrictions Weight Bearing Restrictions: No     Therapy/Group: Individual Therapy  Karoline Caldwell, PT, DPT, CBIS  01/14/2020, 7:47 AM

## 2020-01-14 NOTE — Progress Notes (Signed)
[place] PHYSICAL MEDICINE & REHABILITATION PROGRESS NOTE   Subjective/Complaints: Had a reasonable night. No new complaints.   ROS: Patient denies fever, rash, sore throat, blurred vision, nausea, vomiting, diarrhea, cough, shortness of breath or chest pain,  back pain, headache, or mood change.   Objective:   No results found. No results for input(s): WBC, HGB, HCT, PLT in the last 72 hours. Recent Labs    01/12/20 0451 01/13/20 0608  NA 140 143  K 3.8 3.6  CL 103 106  CO2 26 26  GLUCOSE 98 87  BUN 30* 28*  CREATININE 1.49* 1.46*  CALCIUM 8.3* 8.6*    Intake/Output Summary (Last 24 hours) at 01/14/2020 1046 Last data filed at 01/14/2020 0900 Gross per 24 hour  Intake 750 ml  Output --  Net 750 ml        Physical Exam: Vital Signs Blood pressure (!) 169/49, pulse 61, temperature 98.3 F (36.8 C), temperature source Oral, resp. rate 16, height 5\' 7"  (1.702 m), weight 72.2 kg, SpO2 96 %. Constitutional: No distress . Vital signs reviewed. HEENT: EOMI, oral membranes moist Neck: supple Cardiovascular: RRR without murmur. No JVD    Respiratory/Chest: CTA Bilaterally without wheezes or rales. Normal effort    GI/Abdomen: BS +, non-tender, non-distended Ext: no clubbing, cyanosis, or edema Psych: pleasant but flat Skin: right 2nd toe ischemic throughout its length. No change Neuro: Patient is alert in no acute distress.  L CNVII, dysarthric.  Provides name and age.  Follows simple commands. 5/5 strength throughout.  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 Atrophy bilateral hand muscles. Missing toes.---stable    Assessment/Plan: 1. Functional deficits which require 3+ hours per day of interdisciplinary therapy in a comprehensive inpatient rehab setting.  Physiatrist is providing close team supervision and 24 hour management of active medical problems listed below.  Physiatrist and rehab team continue to assess barriers to discharge/monitor  patient progress toward functional and medical goals  Care Tool:  Bathing    Body parts bathed by patient: Right arm, Left arm, Chest, Abdomen, Front perineal area, Right upper leg, Left upper leg, Face   Body parts bathed by helper: Buttocks, Right lower leg, Left lower leg     Bathing assist Assist Level: Maximal Assistance - Patient 24 - 49%     Upper Body Dressing/Undressing Upper body dressing   What is the patient wearing?: Pull over shirt    Upper body assist Assist Level: Moderate Assistance - Patient 50 - 74%    Lower Body Dressing/Undressing Lower body dressing      What is the patient wearing?: Pants, Incontinence brief     Lower body assist Assist for lower body dressing: Moderate Assistance - Patient 50 - 74%     Toileting Toileting    Toileting assist Assist for toileting: 2 Helpers     Transfers Chair/bed transfer  Transfers assist     Chair/bed transfer assist level: Maximal Assistance - Patient 25 - 49%     Locomotion Ambulation   Ambulation assist      Assist level: 2 helpers Assistive device: Walker-rolling Max distance: 40   Walk 10 feet activity   Assist     Assist level: 2 helpers Assistive device: Walker-rolling   Walk 50 feet activity   Assist Walk 50 feet with 2 turns activity did not occur: Safety/medical concerns         Walk 150 feet activity   Assist Walk 150 feet activity did not occur:  Safety/medical concerns         Walk 10 feet on uneven surface  activity   Assist Walk 10 feet on uneven surfaces activity did not occur: Safety/medical concerns         Wheelchair     Assist Will patient use wheelchair at discharge?: No             Wheelchair 50 feet with 2 turns activity    Assist            Wheelchair 150 feet activity     Assist          Blood pressure (!) 169/49, pulse 61, temperature 98.3 F (36.8 C), temperature source Oral, resp. rate 16, height 5\' 7"   (1.702 m), weight 72.2 kg, SpO2 96 %.    Medical Problem List and Plan: 1.  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  CT 12/1 without acute changes.    2.  Antithrombotics: -DVT/anticoagulation: SCDs             -antiplatelet therapy: Aspirin 81 mg daily and Brilinta 90 mg twice daily x3 months then Brilinta alone 3. Pain Management: Neurontin 300 mg twice daily, Cymbalta 30 mg daily.   -has pain in right foot at night  -will schedule tylenol 4. Mood: Namenda 10 mg twice daily, Aricept 10 mg daily             -antipsychotic agents: N/A 5. Neuropsych: This patient is capable of making decisions on his own behalf. 6. Skin/Wound Care: Routine skin checks  -kerlix wrap ordered for right foot to keep ischemic toe from catching 7. Fluids/Electrolytes/Nutrition: encourage PO 8.  Dysphagia.  Regular consistency with nectar liquids.  Follow-up speech therapy 9.  Hypertension.  Norvasc 10 mg daily, Coreg 6.25 mg twice daily, Cardura 4 mg daily, hydralazine 100 mg 3 times daily, HCTZ 12.5 mg daily, Imdur 60 mg daily. Systolic BP elevated and diastolic BP low- monitor TID.  -12/3 bp reasonably controlled 10.  History of prostate cancer.  Continue Proscar 5 mg daily. 11.  Diabetes mellitus.  Hemoglobin A1c 6.0.  Lantus insulin 20 units daily             11/29: CBG 214 last night. RD consult for dietary education.   12/3 readings higher in PM, increase lantus to 24u qam 12.  Diastolic congestive heart failure.  Monitor for any signs of fluid overload  -weights QOD Filed Weights   01/10/20 1448 01/14/20 0600  Weight: 71.8 kg 72.2 kg    13.  History of gout.  Allopurinol 100 mg daily.  Monitor for any gout flareups 14.  Acute on chronic anemia.  Follow-up outpatient GI services for future endoscopy colonoscopy.  Continue  Protonix 40 mg daily 15.  Hyperlipidemia.  Lipitor 16.  CAD with PCI.  Continue aspirin and Brilinta for now follow-up outpatient cardiology service.  No current plan for ischemic work-up 17.  CKD stage III.  Follow-up chemistries. Cr 1.30 on 11/29. Up to 1.49 on 12/1.   -IVF HS x 2 nights- repeat tonight and check BMET tomorrow  18. Hypokalemia: K+ 3.6- supplement 55meq--->continue 42meq daily    LOS: 4 days A FACE TO FACE EVALUATION WAS PERFORMED  Peter [name]  Peter [name] 01/14/2020, 10:46 AM

## 2020-01-14 NOTE — Progress Notes (Signed)
Blood sugar 71. RN gave Orange juice to prevent a hypoglycemic event. Pt stated, "I am very thirsty." Pt in no acute distress. RN will continue to monitor.

## 2020-01-14 NOTE — Progress Notes (Signed)
Occupational Therapy Session Note  Patient Details  Name: Peter [name] MRN: [id number] Date of Birth: 03-23-1930  Today's Date: 01/14/2020 OT Individual Time: 1300-1400 OT Individual Time Calculation (min): 60 min    Short Term Goals: Week 1:  OT Short Term Goal 1 (Week 1): Pt will transfer to toilet/BSC with Max A of 1 OT Short Term Goal 2 (Week 1): Pt will static stand with no more than Mod A during ADL OT Short Term Goal 3 (Week 1): Pt will perform UB/LB bathing with Mod A with AE PRN OT Short Term Goal 4 (Week 1): Pt will perform sit to stand Mod A to decrease BOC  Skilled Therapeutic Interventions/Progress Updates:    1:1. Pt received in bed agreeable to OT. Pt received with RN and NT in room changing sheet/pt after incontinent BM. Sacral dressing changed. Pt sits EOB with MIN A for trunk elevation. Pt completes dressing at sit to stand at EOB with MAX A to don pants (HOH A to thread BLE/L attention continuation cues) and MAX A to stand at EOB with OT advancing pants past hips with VC for R weight shift. Pt dons shirt with MIN A to pull down back and VC for continuation and L attention (hemi technique). Pt transfers with MAX A to w/c to R with increased trunk and hip flexion in stnading. For baseline outcome measures, Pt completes box and blocks assessment RUE -29 LUE-20 and attempted 9HPT but unable to complete with LUE (3 pegs in 2 min 30 sec). Pt left at RN station with secretary supervising while NT changes sheets and is aware of pt positioning.   Therapy Documentation Precautions:  Precautions Precautions: Fall Precaution Comments: hx syncope, L lean, hx dementia Restrictions Weight Bearing Restrictions: No General:   Vital Signs: Therapy Vitals Temp: 98.3 F (36.8 C) Temp Source: Oral Pulse Rate: 61 Resp: 16 BP: (!) 169/49 Patient Position (if appropriate): Lying Oxygen Therapy SpO2: 96 % O2 Device: Room Air Pain:   ADL: ADL Upper Body Dressing: Maximal  assistance Lower Body Dressing: Dependent Where Assessed-Lower Body Dressing: Edge of bed Toileting: Dependent Where Assessed-Toileting: Bed level Vision   Perception    Praxis   Exercises:   Other Treatments:     Therapy/Group: Individual Therapy  Tonny Branch 01/14/2020, 6:55 AM

## 2020-01-14 NOTE — Plan of Care (Signed)
  Problem: Consults Goal: RH STROKE PATIENT EDUCATION Description: See Patient Education module for education specifics  Outcome: Progressing   Problem: RH BOWEL ELIMINATION Goal: RH STG MANAGE BOWEL WITH ASSISTANCE Description: STG Manage Bowel with min Assistance. Outcome: Progressing   Problem: RH BLADDER ELIMINATION Goal: RH STG MANAGE BLADDER WITH ASSISTANCE Description: STG Manage Bladder With min  Assistance Outcome: Progressing   Problem: RH SKIN INTEGRITY Goal: RH STG SKIN FREE OF INFECTION/BREAKDOWN Description: Manage with min assist Outcome: Progressing Goal: RH STG MAINTAIN SKIN INTEGRITY WITH ASSISTANCE Description: STG Maintain Skin Integrity With min  Assistance. Outcome: Progressing   Problem: RH SAFETY Goal: RH STG ADHERE TO SAFETY PRECAUTIONS W/ASSISTANCE/DEVICE Description: STG Adhere to Safety Precautions With Assistance/Device. Outcome: Progressing   Problem: RH KNOWLEDGE DEFICIT Goal: RH STG INCREASE KNOWLEDGE OF DIABETES Description: Patient will be able to manage DM using medications and dietary modification using handouts and educational information with cues/reminders Outcome: Progressing Goal: RH STG INCREASE KNOWLEDGE OF HYPERTENSION Description: Patient will be able to manage HTN using medications and dietary modification using handouts and educational information with cues/reminders Outcome: Progressing Goal: RH STG INCREASE KNOWLEGDE OF HYPERLIPIDEMIA Description: Patient will be able to manage HLD using medications and dietary modification using handouts and educational information with cues/reminders Outcome: Progressing Goal: RH STG INCREASE KNOWLEDGE OF STROKE PROPHYLAXIS Description: Patient will be able to manage secondary stroke risks using medications and dietary modification using handouts and educational information with cues/reminders Outcome: Progressing

## 2020-01-14 NOTE — Progress Notes (Signed)
[facility] ([facility]) Community Based Palliative Care   °    °This patient is enrolled in our palliative care services in the community.  [facility] will continue to follow for any discharge planning needs and to coordinate continuation of palliative care.   °If you have questions or need assistance, please call [phone] or contact the hospital Liaison listed on AMION.    ° °Thank you for the opportunity to participate in this patient’s care. °    °Chrislyn King, BSN, RN °[facility] Hospital Liaison   °[phone] (24h on call) ° °

## 2020-01-15 ENCOUNTER — Inpatient Hospital Stay (HOSPITAL_COMMUNITY): Payer: Medicare Other

## 2020-01-15 DIAGNOSIS — E119 Type 2 diabetes mellitus without complications: Secondary | ICD-10-CM | POA: Diagnosis not present

## 2020-01-15 DIAGNOSIS — I63511 Cerebral infarction due to unspecified occlusion or stenosis of right middle cerebral artery: Secondary | ICD-10-CM | POA: Diagnosis not present

## 2020-01-15 DIAGNOSIS — R7989 Other specified abnormal findings of blood chemistry: Secondary | ICD-10-CM | POA: Diagnosis not present

## 2020-01-15 DIAGNOSIS — I10 Essential (primary) hypertension: Secondary | ICD-10-CM | POA: Diagnosis not present

## 2020-01-15 LAB — BASIC METABOLIC PANEL
Anion gap: 11 (ref 5–15)
BUN: 30 mg/dL — ABNORMAL HIGH (ref 8–23)
CO2: 26 mmol/L (ref 22–32)
Calcium: 8.2 mg/dL — ABNORMAL LOW (ref 8.9–10.3)
Chloride: 106 mmol/L (ref 98–111)
Creatinine, Ser: 1.34 mg/dL — ABNORMAL HIGH (ref 0.61–1.24)
GFR, Estimated: 51 mL/min — ABNORMAL LOW (ref >60)
Glucose, Bld: 102 mg/dL — ABNORMAL HIGH (ref 70–99)
Potassium: 3.8 mmol/L (ref 3.5–5.1)
Sodium: 143 mmol/L (ref 135–145)

## 2020-01-15 LAB — GLUCOSE, CAPILLARY
Glucose-Capillary: 79 mg/dL (ref 70–99)
Glucose-Capillary: 159 mg/dL — ABNORMAL HIGH (ref 70–99)
Glucose-Capillary: 65 mg/dL — ABNORMAL LOW (ref 70–99)
Glucose-Capillary: 69 mg/dL — ABNORMAL LOW (ref 70–99)
Glucose-Capillary: 103 mg/dL — ABNORMAL HIGH (ref 70–99)
Glucose-Capillary: 128 mg/dL — ABNORMAL HIGH (ref 70–99)

## 2020-01-15 IMAGING — MR MR HEAD W/O CM
12 of 13 series · 44 of 48 positions shown · non-contrast
Comparison: Prior head CT examinations [DATE] and earlier.
Brain MRI [DATE]. CT angiogram head/neck [DATE].

CLINICAL DATA: Stroke, follow-up.

EXAM:
MRI HEAD WITHOUT CONTRAST
TECHNIQUE: Multiplanar, multiecho pulse sequences of the brain and surrounding
structures were obtained without intravenous contrast.

[Series 5: DWI · axial · 3.0mm · 0.88mm/px · z∈[-83,+56]mm · 8 of 96 slices shown (1 of 4)]
[im 1/96]
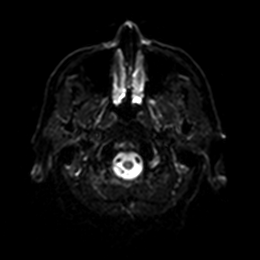
[im 14/96]
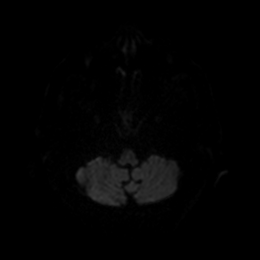
[im 28/96]
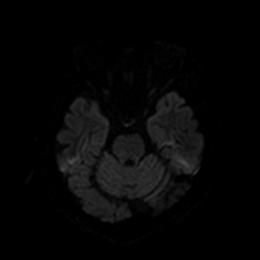
[im 41/96]
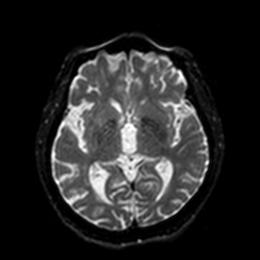
[im 55/96]
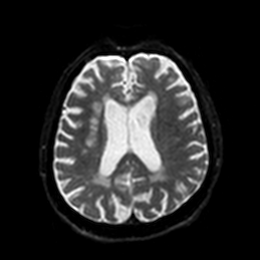
[im 68/96]
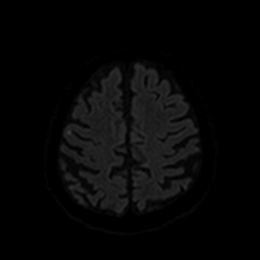
[im 82/96]
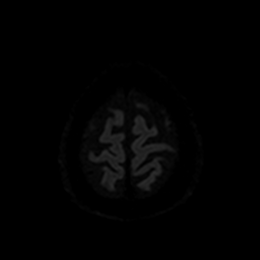
[im 96/96]
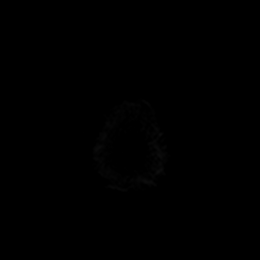

[Series 6: DWI · axial · 3.0mm · 0.88mm/px · z∈[-83,+56]mm · 4 of 48 slices shown (2 of 4)]
[im 1/48]
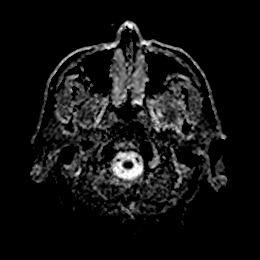
[im 16/48]
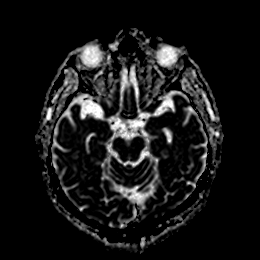
[im 32/48]
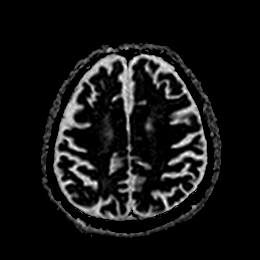
[im 48/48]
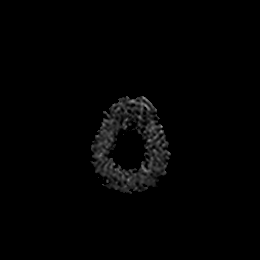

[Series 7: DWI · coronal · 4.0mm · 0.88mm/px · 5 of 66 slices shown (3 of 4)]
[im 1/66]
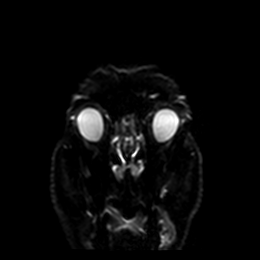
[im 17/66]
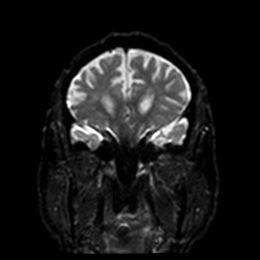
[im 33/66]
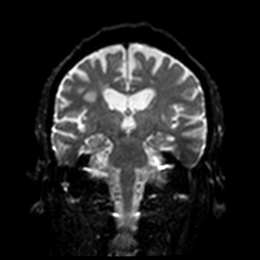
[im 49/66]
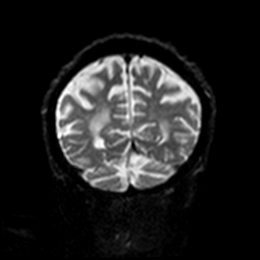
[im 66/66]
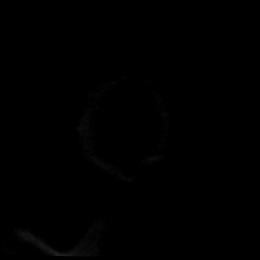

[Series 8: DWI · coronal · 4.0mm · 0.88mm/px · 3 of 33 slices shown (4 of 4)]
[im 1/33]
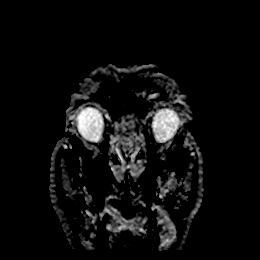
[im 17/33]
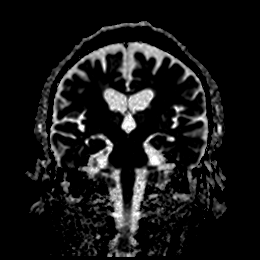
[im 33/33]
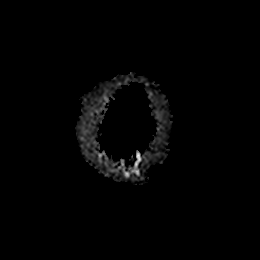

[Series 9: T1 · sagittal · 5.0mm · 0.75mm/px · 2 of 25 slices shown]
[im 1/25]
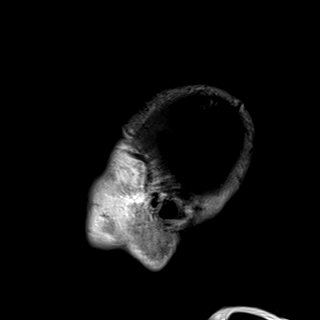
[im 25/25]
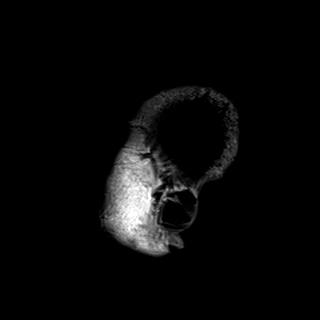

[Series 10: T2 · axial · 5.0mm · 0.72mm/px · z∈[-89,+54]mm · 2 of 25 slices shown (1 of 2)]
[im 1/25]
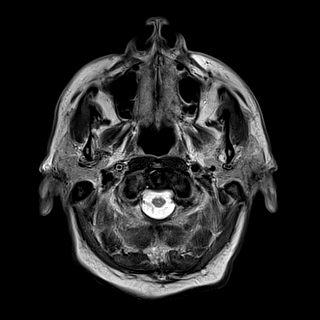
[im 25/25]
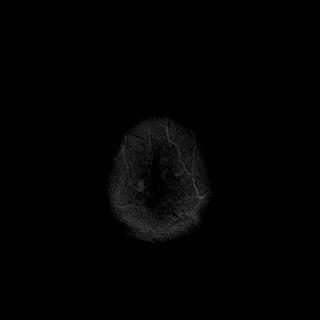

[Series 11: FLAIR · axial · 5.0mm · 0.45mm/px · z∈[-88,+55]mm · 2 of 25 slices shown]
[im 1/25]
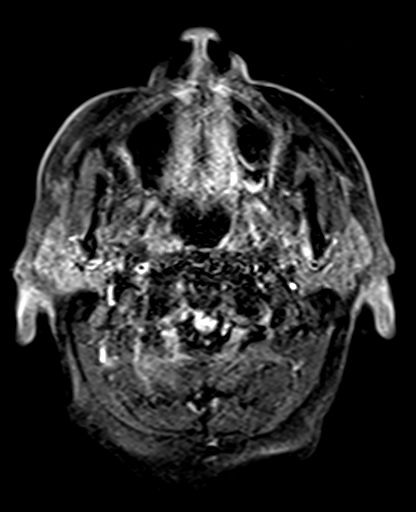
[im 25/25]
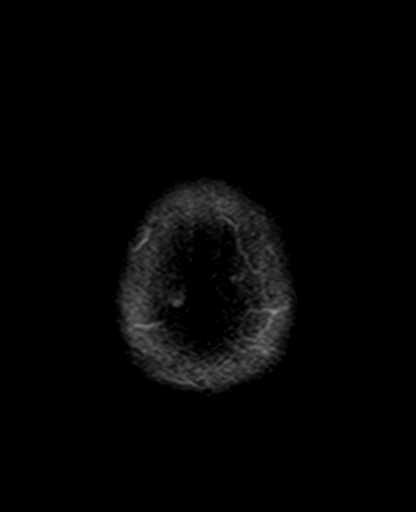

[Series 12: mag_images · axial · 3.0mm · 0.90mm/px · z∈[-93,+59]mm · 4 of 52 slices shown]
[im 1/52]
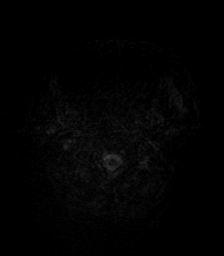
[im 18/52]
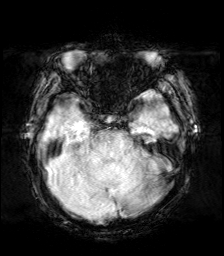
[im 35/52]
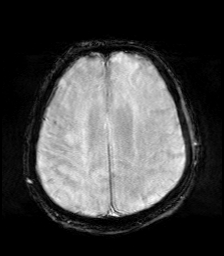
[im 52/52]
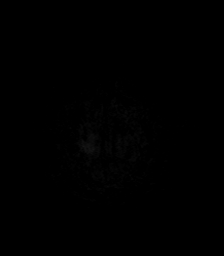

[Series 13: pha_images · axial · 3.0mm · 0.90mm/px · z∈[-90,+59]mm · 4 of 51 slices shown]
[im 1/51]
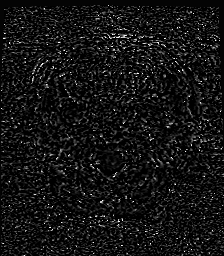
[im 17/51]
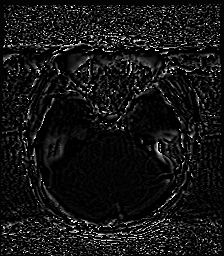
[im 34/51]
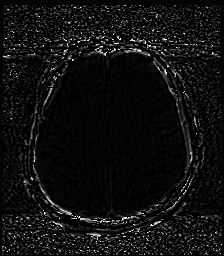
[im 51/51]
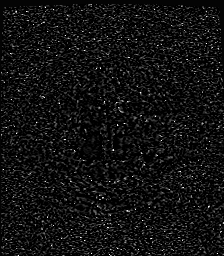

[Series 14: swi_images · axial · 3.0mm · 0.90mm/px · z∈[-93,+59]mm · 4 of 52 slices shown]
[im 1/52]
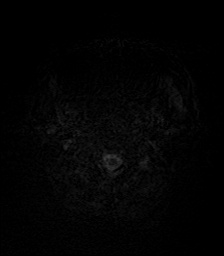
[im 18/52]
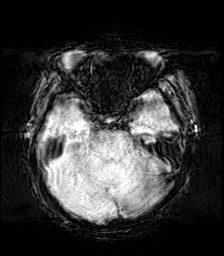
[im 35/52]
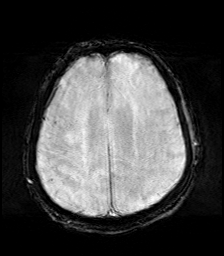
[im 52/52]
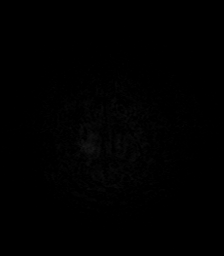

[Series 15: mip_images(sw) · axial · 24.0mm · 0.90mm/px · z∈[-82,+49]mm · 4 of 45 slices shown]
[im 1/45]
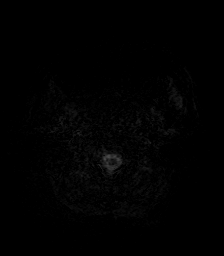
[im 15/45]
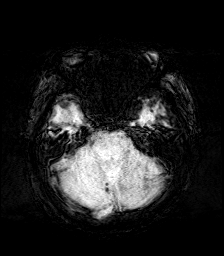
[im 30/45]
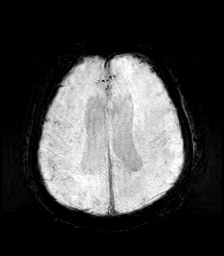
[im 45/45]
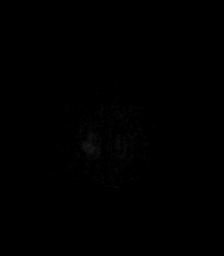

[Series 17: T2 · coronal · 5.0mm · 0.34mm/px · 2 of 29 slices shown (2 of 2)]
[im 1/29]
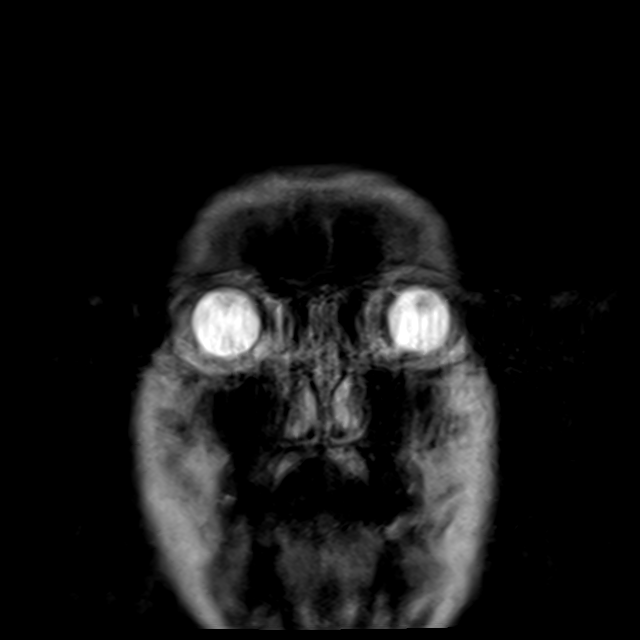
[im 29/29]
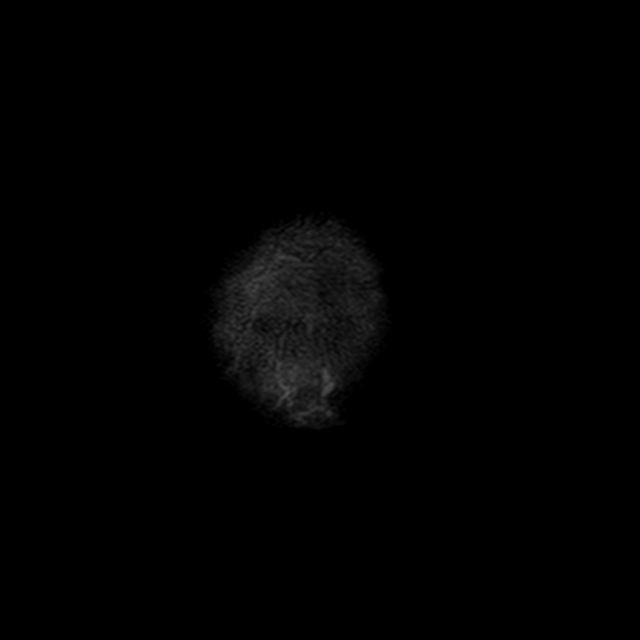

[44 of 48 positions shown; findings below may reference images not displayed]

FINDINGS: Brain:

The examination is intermittently motion degraded. Most notably,
there is moderate/severe motion degradation of the coronal T2
weighted sequence and moderate motion degradation of the sagittal T1
weighted sequence.

There is persistent subtle diffusion hyperintensity at site of the
known, now subacute infarcts within the right insular cortex,
subinsular white matter and right frontal operculum.

However, new from the prior MRI of [DATE], there is an
acute/early subacute infarct within the right corona radiata
measuring 4.5 x 0.8 x 1.6 cm (AP x TV x CC) (for instance as seen on
series 5, image 77) (series 7, image 49). New separate smaller
acute/early subacute infarcts within the right corona radiata, right
subinsular white matter and right lentiform nucleus. New punctate
acute/early subacute infarct within the right frontal lobe motor
strip (series 5, image 82). Corresponding T2/FLAIR hyperintensity at
these sites.

Mild multifocal T2/FLAIR hyperintensity within the cerebral white
matter elsewhere is nonspecific, but compatible with chronic small
vessel ischemic disease. Redemonstrated small chronic lacunar
infarct within the left lentiform nucleus.

No evidence of intracranial mass.

No chronic intracranial blood products.

No extra-axial fluid collection.

No midline shift.

Vascular: Known high-grade proximal M1 right MCA stenosis
demonstrated on the prior CTA head/neck of [DATE].

Skull and upper cervical spine: No focal marrow lesion.

Sinuses/Orbits: Visualized orbits show no acute finding. Trace
ethmoid sinus mucosal thickening.

Other: Bilateral mastoid effusions.
IMPRESSION: Motion degraded examination as described.

New from the prior MRI of [DATE], there is an acute/early
subacute infarct within the right corona radiata measuring 4.5 x
cm. New separate smaller acute/early subacute infarcts within the
right corona radiata, right subinsular white matter and right
lentiform nucleus. Additional punctate acute/early subacute infarct
within the right frontal lobe motor strip.

These findings are superimposed upon background known subacute right
MCA infarcts.

Stable background cerebral atrophy and chronic small vessel ischemic
disease.

## 2020-01-15 MED ORDER — SODIUM CHLORIDE 0.45 % IV SOLN: INTRAVENOUS | Status: DC

## 2020-01-15 NOTE — Progress Notes (Signed)
Hypoglycemic Event  CBG: 65  Treatment: 8 oz juice/soda  Symptoms: None  Follow-up CBG: Time:1746 CBG Result:69  Possible Reasons for Event: Inadequate meal intake  Comments/MD notified: MD Naaman Plummer notified. Patient continues to sip on 8oz soda. Blood glucose taken again at 1839 and CBG presented at 103.    Peter [name] Peter [name]

## 2020-01-15 NOTE — Progress Notes (Signed)
[place] PHYSICAL MEDICINE & REHABILITATION PROGRESS NOTE   Subjective/Complaints: Had a good night. Still feels cold, lost his hat  ROS: Patient denies fever, rash, sore throat, blurred vision, nausea, vomiting, diarrhea, cough, shortness of breath or chest pain, joint or back pain, headache, or mood change.    Objective:   No results found. No results for input(s): WBC, HGB, HCT, PLT in the last 72 hours. Recent Labs    01/13/20 0608 01/15/20 0443  NA 143 143  K 3.6 3.8  CL 106 106  CO2 26 26  GLUCOSE 87 102*  BUN 28* 30*  CREATININE 1.46* 1.34*  CALCIUM 8.6* 8.2*    Intake/Output Summary (Last 24 hours) at 01/15/2020 0948 Last data filed at 01/15/2020 0837 Gross per 24 hour  Intake 400 ml  Output --  Net 400 ml        Physical Exam: Vital Signs Blood pressure (!) 180/66, pulse 70, temperature 98.6 F (37 C), temperature source Oral, resp. rate 17, height 5\' 7"  (1.702 m), weight 72.2 kg, SpO2 98 %. Constitutional: No distress . Vital signs reviewed. HEENT: EOMI, oral membranes moist Neck: supple Cardiovascular: RRR without murmur. No JVD    Respiratory/Chest: CTA Bilaterally without wheezes or rales. Normal effort    GI/Abdomen: BS +, non-tender, non-distended Ext: no clubbing, cyanosis, or edema Psych: pleasant and cooperative Skin: right 2nd toe ischemic throughout its length--stable appearance Neuro: Patient is alert in no acute distress.  L CNVII, dysarthric.  Provides name and age.  Follows simple commands. 5/5 strength throughout.  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 Atrophy bilateral hand muscles without change. Missing toes.---stable    Assessment/Plan: 1. Functional deficits which require 3+ hours per day of interdisciplinary therapy in a comprehensive inpatient rehab setting.  Physiatrist is providing close team supervision and 24 hour management of active medical problems listed below.  Physiatrist and rehab team  continue to assess barriers to discharge/monitor patient progress toward functional and medical goals  Care Tool:  Bathing    Body parts bathed by patient: Right arm, Left arm, Chest, Abdomen, Front perineal area, Right upper leg, Left upper leg, Face   Body parts bathed by helper: Buttocks, Right lower leg, Left lower leg     Bathing assist Assist Level: Maximal Assistance - Patient 24 - 49%     Upper Body Dressing/Undressing Upper body dressing   What is the patient wearing?: Pull over shirt    Upper body assist Assist Level: Minimal Assistance - Patient > 75%    Lower Body Dressing/Undressing Lower body dressing      What is the patient wearing?: Pants, Incontinence brief     Lower body assist Assist for lower body dressing: Maximal Assistance - Patient 25 - 49%     Toileting Toileting    Toileting assist Assist for toileting: 2 Helpers     Transfers Chair/bed transfer  Transfers assist     Chair/bed transfer assist level: Maximal Assistance - Patient 25 - 49%     Locomotion Ambulation   Ambulation assist      Assist level: 2 helpers Assistive device: Hand held assist Max distance: 100   Walk 10 feet activity   Assist     Assist level: 2 helpers Assistive device: Hand held assist   Walk 50 feet activity   Assist Walk 50 feet with 2 turns activity did not occur: Safety/medical concerns  Assist level: 2 helpers Assistive device: Hand held assist    Walk  150 feet activity   Assist Walk 150 feet activity did not occur: Safety/medical concerns         Walk 10 feet on uneven surface  activity   Assist Walk 10 feet on uneven surfaces activity did not occur: Safety/medical concerns         Wheelchair     Assist Will patient use wheelchair at discharge?: No             Wheelchair 50 feet with 2 turns activity    Assist            Wheelchair 150 feet activity     Assist          Blood pressure  (!) 180/66, pulse 70, temperature 98.6 F (37 C), temperature source Oral, resp. rate 17, height 5\' 7"  (1.702 m), weight 72.2 kg, SpO2 98 %.    Medical Problem List and Plan: 1.  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  CT 12/1 without acute changes.    2.  Antithrombotics: -DVT/anticoagulation: SCDs             -antiplatelet therapy: Aspirin 81 mg daily and Brilinta 90 mg twice daily x3 months then Brilinta alone 3. Pain Management: Neurontin 300 mg twice daily, Cymbalta 30 mg daily.   -has pain in right foot at night  - scheduled tylenol 4. Mood: Namenda 10 mg twice daily, Aricept 10 mg daily             -antipsychotic agents: N/A 5. Neuropsych: This patient is capable of making decisions on his own behalf. 6. Skin/Wound Care: Routine skin checks  -kerlix wrap ordered for right foot to keep ischemic toe from catching 7. Fluids/Electrolytes/Nutrition: encourage PO 8.  Dysphagia.  Regular consistency with nectar liquids.  Follow-up speech therapy 9.  Hypertension.  Norvasc 10 mg daily, Coreg 6.25 mg twice daily, Cardura 4 mg daily, hydralazine 100 mg 3 times daily, HCTZ 12.5 mg daily, Imdur 60 mg daily. Systolic BP elevated and diastolic BP low- monitor TID.  -12/4 bp borderline controlled 10.  History of prostate cancer.  Continue Proscar 5 mg daily. 11.  Diabetes mellitus.  Hemoglobin A1c 6.0.  Lantus insulin 20 units daily             11/29: CBG 214 last night. RD consult for dietary education.   12/3 readings higher in PM, increase lantus to 24u qam  12/4 monitor for PM pattern today 12.  Diastolic congestive heart failure.  Monitor for any signs of fluid overload  -weights QOD Filed Weights   01/10/20 1448 01/14/20 0600  Weight: 71.8 kg 72.2 kg    13.  History of gout.  Allopurinol 100 mg daily.   Monitor for any gout flareups 14.  Acute on chronic anemia.  Follow-up outpatient GI services for future endoscopy colonoscopy.  Continue Protonix 40 mg daily 15.  Hyperlipidemia.  Lipitor 16.  CAD with PCI.  Continue aspirin and Brilinta for now follow-up outpatient cardiology service.  No current plan for ischemic work-up 17.  CKD stage III.  Follow-up chemistries. Cr 1.30 on 11/29. Up to 1.49 on 12/1.   -IVF HS x 3 nights- without substantial change   -suspect baseline Cr is 1.3-1.4   -  dc HS IVF 18. Hypokalemia: K+ 3.8-->continue 65meq daily    LOS: 5 days A FACE TO FACE EVALUATION WAS PERFORMED  Meredith Staggers 01/15/2020, 9:48 AM

## 2020-01-15 NOTE — Progress Notes (Signed)
Pt. Went down for MRI tonight. MD notified that result of MRI  is now available. Per MD he will take a look at the result. Pt. Is currently sleeping. Will monitor.

## 2020-01-15 NOTE — Plan of Care (Signed)
  Problem: Consults Goal: RH STROKE PATIENT EDUCATION Description: See Patient Education module for education specifics  Outcome: Progressing   Problem: RH BOWEL ELIMINATION Goal: RH STG MANAGE BOWEL WITH ASSISTANCE Description: STG Manage Bowel with min Assistance. Outcome: Progressing   Problem: RH BLADDER ELIMINATION Goal: RH STG MANAGE BLADDER WITH ASSISTANCE Description: STG Manage Bladder With min  Assistance Outcome: Progressing   Problem: RH SKIN INTEGRITY Goal: RH STG SKIN FREE OF INFECTION/BREAKDOWN Description: Manage with min assist Outcome: Progressing Goal: RH STG MAINTAIN SKIN INTEGRITY WITH ASSISTANCE Description: STG Maintain Skin Integrity With min  Assistance. Outcome: Progressing   Problem: RH SAFETY Goal: RH STG ADHERE TO SAFETY PRECAUTIONS W/ASSISTANCE/DEVICE Description: STG Adhere to Safety Precautions With Assistance/Device. Outcome: Progressing   Problem: RH KNOWLEDGE DEFICIT Goal: RH STG INCREASE KNOWLEDGE OF DIABETES Description: Patient will be able to manage DM using medications and dietary modification using handouts and educational information with cues/reminders Outcome: Progressing Goal: RH STG INCREASE KNOWLEDGE OF HYPERTENSION Description: Patient will be able to manage HTN using medications and dietary modification using handouts and educational information with cues/reminders Outcome: Progressing Goal: RH STG INCREASE KNOWLEGDE OF HYPERLIPIDEMIA Description: Patient will be able to manage HLD using medications and dietary modification using handouts and educational information with cues/reminders Outcome: Progressing Goal: RH STG INCREASE KNOWLEDGE OF STROKE PROPHYLAXIS Description: Patient will be able to manage secondary stroke risks using medications and dietary modification using handouts and educational information with cues/reminders Outcome: Progressing

## 2020-01-15 NOTE — Progress Notes (Addendum)
Nurse and daughter have noted more weakness and inattention on left side in addition to more generalized lethargy. CT on Wednesday notable for new cortical infarcts. Spoke with daughter at length this evening about his presentation and discussed how new infarcts may evolve and symptoms may worsen over 72 hours after infarct before improving. Nevertheless, given worsening weakness will order an MRI of brain without contrast. Add back IVF  given poor intake today.     ADDENDUM 23:20   Personally reviewed MRI which demonstrates acute/subacute infarcts in the right subinsular cortex, right corona radiata c/w findings on CT 12/1. Also visible is another subacute/acute infarct in the right frontal lobe. Pt is currently resting comfortably per RN, VSS. Will f/u with neurology again in the morning to see if there are any new recommendations moving forward.   Meredith Staggers, MD, [facility] 01/15/2020

## 2020-01-15 NOTE — Progress Notes (Signed)
Spoke with Daughter and Wife in length concerning patient and their concerns. Daughter presented pictures and videos of patient's movement at same time of day yesterday. Did explain to the Daughter that the patient has been lethargic and sleeping a lot. Patient also has been encouraged more fluids and intake through out the day. Explain to Daughter that she should make a list of the patient's favorite foods to help facilitate better eating consumption and nurse will coordinate with dietary on 01/16/2020 to help with better consumption. Will continue to push fluids and better intake during patient stay. MD called and made aware. Sanda Linger, LPN

## 2020-01-16 ENCOUNTER — Inpatient Hospital Stay (HOSPITAL_COMMUNITY): Payer: Medicare Other

## 2020-01-16 ENCOUNTER — Inpatient Hospital Stay (HOSPITAL_COMMUNITY): Payer: Medicare Other | Admitting: Occupational Therapy

## 2020-01-16 ENCOUNTER — Inpatient Hospital Stay (HOSPITAL_COMMUNITY): Payer: Medicare Other | Admitting: Speech Pathology

## 2020-01-16 DIAGNOSIS — N1831 Chronic kidney disease, stage 3a: Secondary | ICD-10-CM

## 2020-01-16 DIAGNOSIS — E440 Moderate protein-calorie malnutrition: Secondary | ICD-10-CM

## 2020-01-16 DIAGNOSIS — I63511 Cerebral infarction due to unspecified occlusion or stenosis of right middle cerebral artery: Secondary | ICD-10-CM | POA: Diagnosis not present

## 2020-01-16 DIAGNOSIS — E119 Type 2 diabetes mellitus without complications: Secondary | ICD-10-CM | POA: Diagnosis not present

## 2020-01-16 LAB — BASIC METABOLIC PANEL
Anion gap: 9 (ref 5–15)
BUN: 28 mg/dL — ABNORMAL HIGH (ref 8–23)
CO2: 27 mmol/L (ref 22–32)
Calcium: 8.1 mg/dL — ABNORMAL LOW (ref 8.9–10.3)
Chloride: 106 mmol/L (ref 98–111)
Creatinine, Ser: 1.37 mg/dL — ABNORMAL HIGH (ref 0.61–1.24)
GFR, Estimated: 50 mL/min — ABNORMAL LOW (ref >60)
Glucose, Bld: 85 mg/dL (ref 70–99)
Potassium: 3.8 mmol/L (ref 3.5–5.1)
Sodium: 142 mmol/L (ref 135–145)

## 2020-01-16 LAB — GLUCOSE, CAPILLARY
Glucose-Capillary: 83 mg/dL (ref 70–99)
Glucose-Capillary: 212 mg/dL — ABNORMAL HIGH (ref 70–99)
Glucose-Capillary: 141 mg/dL — ABNORMAL HIGH (ref 70–99)
Glucose-Capillary: 148 mg/dL — ABNORMAL HIGH (ref 70–99)

## 2020-01-16 LAB — CBC
HCT: 22.9 % — ABNORMAL LOW (ref 39.0–52.0)
Hemoglobin: 7.4 g/dL — ABNORMAL LOW (ref 13.0–17.0)
MCH: 27.2 pg (ref 26.0–34.0)
MCHC: 32.3 g/dL (ref 30.0–36.0)
MCV: 84.2 fL (ref 80.0–100.0)
Platelets: 295 10*3/uL (ref 150–400)
RBC: 2.72 MIL/uL — ABNORMAL LOW (ref 4.22–5.81)
RDW: 17.2 % — ABNORMAL HIGH (ref 11.5–15.5)
WBC: 9.5 10*3/uL (ref 4.0–10.5)
nRBC: 0 % (ref 0.0–0.2)

## 2020-01-16 LAB — PREPARE RBC (CROSSMATCH)

## 2020-01-16 IMAGING — CT CT ANGIO HEAD
2 of 10 series · 7 of 47 positions shown · IV contrast (Omni 300)
Comparison: Brain MRI [DATE].

CLINICAL DATA: Stroke suspected.

EXAM:
CT ANGIOGRAPHY HEAD
TECHNIQUE: Multidetector CT imaging of the head was performed using the
standard protocol during bolus administration of intravenous
contrast. Multiplanar CT image reconstructions and MIPs were
obtained to evaluate the vascular anatomy.
CONTRAST:  75mL OMNIPAQUE IOHEXOL 350 MG/ML SOLN

[Series 10: cow 2.0 · axial · 0.47mm/px · z∈[-202,-80]mm · 6 of 87 slices shown]
[im 13/87  brain]
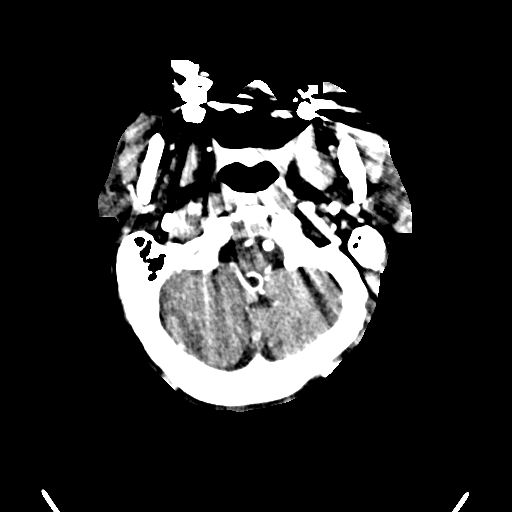
[im 25/87  bone]
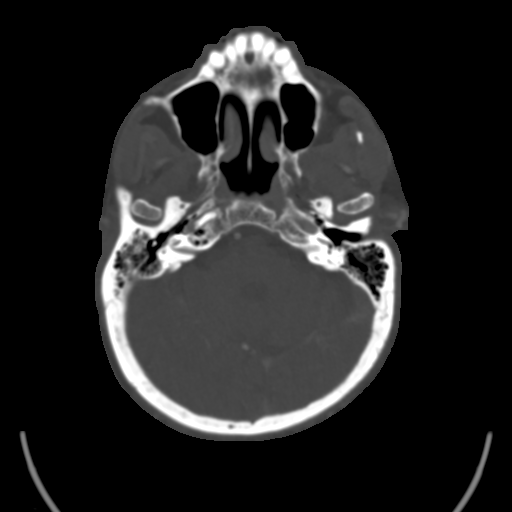
[im 37/87  brain]
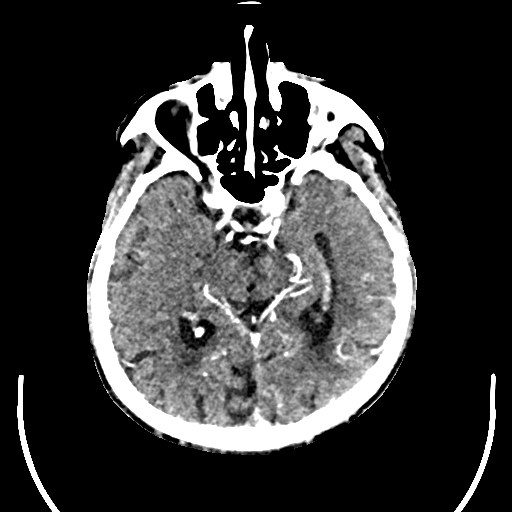
[im 50/87  bone]
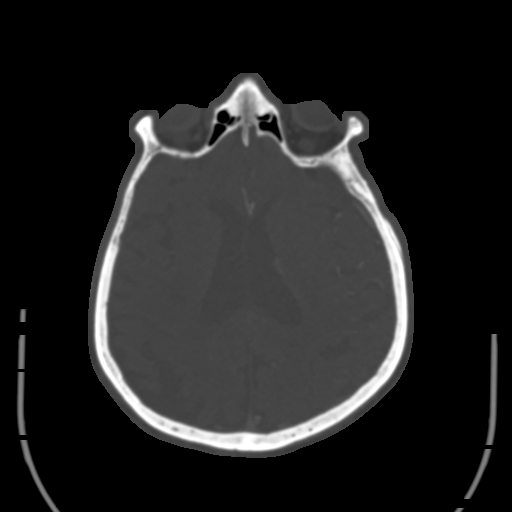
[im 62/87  brain]
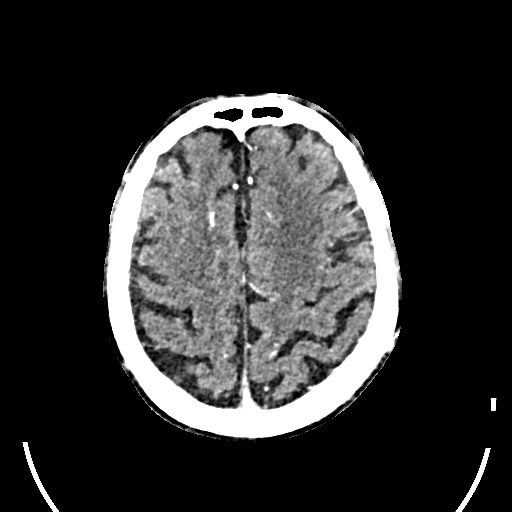
[im 74/87  bone]
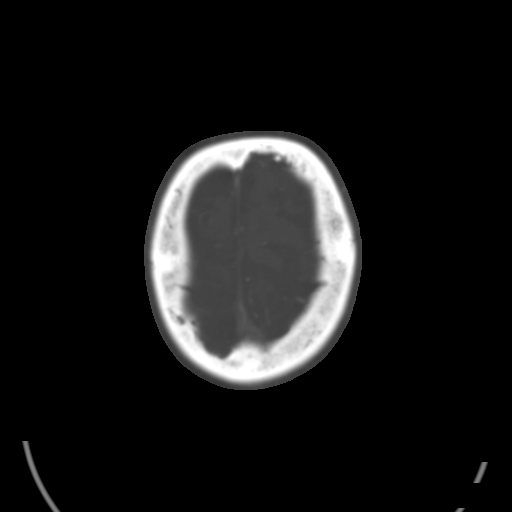

[Series 19: sagittal · sagittal · 0.33mm/px · 1 of 67 slices shown]
[im 57/67  brain]
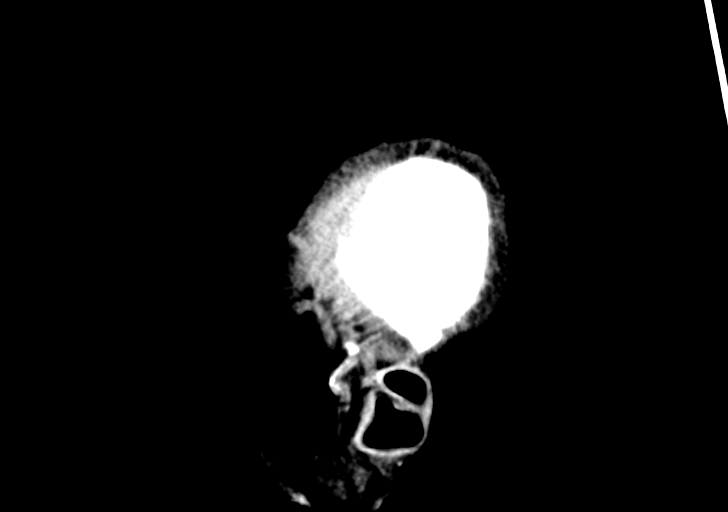

[7 of 47 positions shown; findings below may reference images not displayed]

Prior noncontrast head CT
examinations [DATE] and earlier. CT angiogram head/neck
[DATE].
FINDINGS: CT HEAD

Brain:

Mild cerebral atrophy.

Redemonstrated acute/subacute infarcts within the right corona
radiata, right frontal lobe, right insula/subinsular white matter
and right basal ganglia, which appear unchanged as compared to the
brain MRI performed yesterday [DATE]. No interval demarcated
cortical infarct is identified. No evidence of hemorrhagic
conversion.

Stable background chronic small vessel ischemic disease.

No evidence of intracranial mass.

No midline shift.

Vascular: Reported below.

Skull: Normal. Negative for fracture or focal lesion.

Sinuses: No significant paranasal sinus disease.

Orbits: No mass or acute finding.

CTA HEAD

Anterior circulation:

The intracranial internal carotid arteries are patent.
Redemonstrated prominent calcified plaque throughout the
intracranial right ICA with multifocal stenoses. Most notably, there
is redemonstrated severe stenosis of the cavernous segment.
Redemonstrated calcified plaque throughout the intracranial left ICA
with no more than mild to moderate stenosis. Progressive 10 mm long
near occlusive stenosis of the M1 right MCA versus segmental
occlusion with some distal reconstitution. Some enhancement is seen
more distally within the M1 right MCA and right M2 vessels, although
markedly diminished as compared to the left and decreased as
compared to the prior CT a of [DATE]. The M1 left MCA is patent
without significant stenosis. No left M2 proximal branch occlusion
or high-grade proximal stenosis is identified. The A1 right ACA is
hypoplastic with redemonstrated severe proximal stenosis.

Posterior circulation:

The intracranial vertebral arteries are patent. Atherosclerotic
plaque within both vessels. Moderate/severe stenosis on the right.
Mild/moderate stenosis on the left. The basilar artery is patent.
1-2 mm outpouching arising from the right P1/P2 junction favored to
reflect minimal residual enhancement within an otherwise occluded
right posterior communicating artery, unchanged. The posterior
cerebral arteries are patent. Predominantly fetal origin of the left
posterior cerebral artery.

Venous sinuses: Within the limitations of contrast timing, no
convincing thrombus.

Anatomic variants: As described

Impression 1 under the CTA head sectrion below will be called to the
ordering clinician or representative by the Radiologist Assistant,
and communication documented in the PACS or [REDACTED].
IMPRESSION: CT head:

Acute/subacute infarcts within the right corona radiata, right
frontal lobe, right insula/subinsular white matter and right basal
ganglia appear unchanged as compared to the brain MRI performed
yesterday. No interval demarcated cortical infarct is identified. No
evidence of hemorrhagic conversion.

CTA head:

1. Progressive 10 mm long near occlusive stenosis of the M1 right
MCA versus now segmental occlusion with some distal reconstitution.
Some enhancement is seen more distally within the M1 and M2 right
MCA vessels, although markedly diminished as compared to the left
and significantly decreased as compared to the prior CTA of
[DATE].
2. Otherwise, no significant interval change as compared to the
prior CTA head of [DATE] as detailed.

## 2020-01-16 MED ORDER — IOHEXOL 350 MG/ML SOLN
75.0000 mL | Freq: Once | INTRAVENOUS | Status: AC | PRN
  Administered 2020-01-16: 75 mL via INTRAVENOUS

## 2020-01-16 MED ORDER — SODIUM CHLORIDE 0.45 % IV SOLN: INTRAVENOUS | Status: DC

## 2020-01-16 MED ORDER — SODIUM CHLORIDE 0.9% IV SOLUTION: Freq: Once | INTRAVENOUS | Status: AC

## 2020-01-16 MED ORDER — ACETAMINOPHEN 325 MG PO TABS
650.0000 mg | ORAL_TABLET | Freq: Once | ORAL | Status: DC
  Filled 2020-01-16: qty 2

## 2020-01-16 MED ORDER — ACETAMINOPHEN 650 MG RE SUPP
650.0000 mg | Freq: Once | RECTAL | Status: AC
  Administered 2020-01-16: 650 mg via RECTAL
  Filled 2020-01-16: qty 1

## 2020-01-16 NOTE — Progress Notes (Signed)
Occupational Therapy Session Note  Patient Details  Name: Peter [name] MRN: [id number] Date of Birth: 1930-06-26  Today's Date: 01/16/2020 OT Individual Time: 9417-4081 OT Individual Time Calculation (min): 59 min   Short Term Goals: Week 1:  OT Short Term Goal 1 (Week 1): Pt will transfer to toilet/BSC with Max A of 1 OT Short Term Goal 2 (Week 1): Pt will static stand with no more than Mod A during ADL OT Short Term Goal 3 (Week 1): Pt will perform UB/LB bathing with Mod A with AE PRN OT Short Term Goal 4 (Week 1): Pt will perform sit to stand Mod A to decrease BOC  Skilled Therapeutic Interventions/Progress Updates:    Pt greeted in bed with no c/o pain. He was eating his breakfast with full supervision from NT. Pt agreeable to continue eating while sitting EOB to work on his sitting balance. Max A for supine<sit, and for the first 6 minutes pt able to maintain sitting balance with close supervision. However after those 6 minutes, pt with increased Lt lean/LOB needing cues to correct midline or cues + Mod-Max A for neutral midline. Note that there was no swallowing safety sheet above his bed. Per RN, this is normal. Pt exhibited increased oral holding with anterior spillage out of mouth. Vcs and manual assist for small bites, verbal cuing for pausing eating as appropriate to swallow/clear oral cavity. He did initiate using his Lt hand but required assistance to use the Lt UE as a gross stabilizer when drinking his thickened beverages or eating magic cup. Vcs for Lt attention when releasing grip from these containers as well. After handwashing using sanitizer, pt then engaged in dressing tasks at sit<stand level. Max-Total A for LB dressing tasks with pt able to obtain figure 4 position with the Rt LE, assistance needed for obtaining with the Lt LE. +2 via 3 muskateers for sit<stand to pull up pants, pt still having a very difficult time with obtaining midline orientation and was leaning to the  Lt in standing. Max A for donning overhead shirt with facilitation and instruction on hemi techniques. Max A for lateral scoot up towards [place] and +2 for returning to supine position. Pt remained in bed at close of session, call bell within reach and bed alarm set. Tx focus placed on balance, Lt attention, Lt NMR, and ADL retraining.   Therapy Documentation Precautions:  Precautions Precautions: Fall Precaution Comments: hx syncope, L lean, hx dementia Restrictions Weight Bearing Restrictions: No Pain: Pain Assessment Pain Scale: 0-10 Pain Score: 0-No pain ADL: ADL Upper Body Dressing: Maximal assistance Lower Body Dressing: Dependent Where Assessed-Lower Body Dressing: Edge of bed Toileting: Dependent Where Assessed-Toileting: Bed level      Therapy/Group: Individual Therapy  Michaela A Hoffman 01/16/2020, 12:36 PM

## 2020-01-16 NOTE — Progress Notes (Signed)
Physical Therapy Session Note  Patient Details  Name: Peter [name] MRN: [id number] Date of Birth: 1931/01/05  Today's Date: 01/16/2020 PT Individual Time: 0076-2263 and 1305-1330 PT Individual Time Calculation (min): 55 min and 25 min  Short Term Goals: Week 1:  PT Short Term Goal 1 (Week 1): Pt will ambulate 70ft w/RW and mod assist PT Short Term Goal 2 (Week 1): Pt will transfer bed to/from wc w/RW and mod assist PT Short Term Goal 3 (Week 1): Pt will maintain static sitting balance on edge of bed w/min assist  Skilled Therapeutic Interventions/Progress Updates:     Session 1: Patient in bed asleep upon PT arrival. Patient aroused to verbal and tactile stimulation, remained lethargic with eyes intermittently closed requiring tactile stimulation or sternal rub to maintain arousal. Patient was agreeable to PT session. Patient denied pain during session. Patient's wife called the room at beginning of session asking about patient's progress today. Agreeable to therapist calling back at end of session for an update on patient's functional status.   Therapeutic Activity: Bed Mobility: Patient performed supine to/from sit with max A of 1-2 for trunk and lower extremity management. Provided verbal cues for log roll technique and initiation. Transfers: Patient performed squat pivot transfers bed<>w/c and w/c<>mat table with max A +2. Provided verbal cues for initiation and hand placement. Required hand over hand assist for R hand placement to avoid pushing during transfers. Attempted standing from an elevated hospital bed, and an elevated mat table with and without a Stedy with max a +2 without success due to poor initiation from patient and L inattention.    Patient sat EOB and EOM with max-min A for sitting balance due to L lean and pushing with R upper extremity. Focused on midline orientation with external visual target. Patient did not follow basic 1-step commands for volitional movement of L  upper/lower extremity sitting EOB. Nurologist rounded with patient sitting EOM. Patient did follow intermittent commands for L upper and lower extremity strength assessment and demonstrated lack of sensation on the L with testing. Reported increased neglect and decline in functional mobility today to neurologist.   Patient returned to bed at end of session for safety due to poor sitting posture and arousal sitting in the w/c, with breaks locked, bed alarm set, and all needs within reach.   Called patient's wife at end of session to report on patient's functional status. Informed her that the neurologist came by during session. Encouraged her to follow up with the medical team with further questions regarding patient's medical status. Informed her that therapy with continue to work on progress for improved functional mobility with the patient at this time. Encouraged her and her daughter call or come to afternoon session at 1pm if able. Patient's wife was very [employer].   Session 2: Patient in bed asleep upon PT arrival. Patient very difficult to arouse. Did not arouse to verbal or tactile stimulation and would only briefly arouse to painful stimulus. RN made aware and reported patient to go to CT angiogram shortly.   Timed toileting noted by clock on door. Provided patient with bed pan, see mobility below. Patient unable to have BM or void at this time due to lethargy.   Therapeutic Activity: Bed Mobility: Patient performed rolling R/L x4 with max A of 1-2, would intermittently hold L rail with R hand in L side-lying. Provided verbal cues for bending lower extremities and reaching with opposite hand to roll with max hand over hand assist. Pulled  pants down/up, placed and removed bed pan, performed peri-care, and doffed/donned incontin  Following toileting, transport team arrived to take patient to CT angiogram. Patient missed 20 min of skilled PT due to imaging, RN made aware. Will attempt to  make-up missed time as able.    Patient in bed asleep at end of session with breaks locked, bed alarm set, and all needs within reach.   Returned at 1340 per request from patient's wife, who arrived shortly after the patient left for imaging. Discussed with her and her daughter via speaker phone about patient's poor performance during his most recent therapy session due to lethargy and explained that the patient had been taken down for further imaging. [name] with questions about patient's current level of function, new deficits, and medical plan. This PT provided answers to the best of their ability and encourage the family to wait for the results from imaging to discuss further with the medical team.    Therapy Documentation Precautions:  Precautions Precautions: Fall Precaution Comments: hx syncope, L lean, hx dementia Restrictions Weight Bearing Restrictions: No   Therapy/Group: Individual Therapy  Jalie Eiland L Jumaane Weatherford PT, DPT  01/16/2020, 12:10 PM

## 2020-01-16 NOTE — Progress Notes (Signed)
Speech Language Pathology Daily Session Note  Patient Details  Name: Peter [name] MRN: [id number] Date of Birth: 1930-04-01  Today's Date: 01/16/2020 SLP Individual Time: 1500-1530 SLP Individual Time Calculation (min): 30 min  Short Term Goals: Week 1: SLP Short Term Goal 1 (Week 1): Pt will participate in continued cognitive lingustic assessment. SLP Short Term Goal 2 (Week 1): Pt will demonstrate use of speech intelligibility skills at the phrase level with 80% accuarcy with min A verbal cues. SLP Short Term Goal 3 (Week 1): Pt will consume regular textures and nectar thick liquids with mod A verbal cues for swallow strategies. SLP Short Term Goal 4 (Week 1): Pt will be alert and participate in trials of thin liquids with minimal overt s/s aspiration over x1 prior to placement on water protocol. SLP Short Term Goal 5 (Week 1): Pt will recall daily and novel events with mod A verbal cues for use of external aids.  Skilled Therapeutic Interventions:  Pt was seen for skilled ST targeting goals for speech intelligibility and dysphagia.  Pt undergoing blood transfusion upon therapist's arrival but LPN cleared him for participation in bedside speech therapy.  Pt was awake, alert, and pleasantly interactive throughout therapy session.   Therapist facilitated the session with functional trials of thin liquids following oral care to continue working towards diet progression.  Pt demonstrated no overt s/s of aspiration with thin liquids despite some slight oral holding which family and caregiver report to be baseline.  Pt needed mod cues to increase vocal intensity and slow rate of speech to achieve intelligibility at the phrase level during informal conversations with SLP.  Pt was left in bed with bed alarm set and call bell within reach.  Continue per current plan of care.    Pain Pain Assessment Pain Scale: 0-10 Pain Score: 0-No pain  Therapy/Group: Individual Therapy  Juanantonio Stolar, Selinda Orion 01/16/2020, 4:35 PM

## 2020-01-16 NOTE — Plan of Care (Signed)
  Problem: RH BLADDER ELIMINATION Goal: RH STG MANAGE BLADDER WITH ASSISTANCE Description: STG Manage Bladder With min  Assistance Outcome: Not Progressing Note: Patient is not awake enough for adequate fluid intake. Fluids offered around the clock.   Problem: RH KNOWLEDGE DEFICIT Goal: RH STG INCREASE KNOWLEDGE OF DIABETES Description: Patient will be able to manage DM using medications and dietary modification using handouts and educational information with cues/reminders Outcome: Not Progressing Note: Patient unable to have adequate knowledge intake. Family has been educated. Goal: RH STG INCREASE KNOWLEDGE OF HYPERTENSION Description: Patient will be able to manage HTN using medications and dietary modification using handouts and educational information with cues/reminders Outcome: Not Progressing Note: Patient unable to have adequate knowledge intake. Family has been educated. Goal: RH STG INCREASE KNOWLEGDE OF HYPERLIPIDEMIA Description: Patient will be able to manage HLD using medications and dietary modification using handouts and educational information with cues/reminders Outcome: Not Progressing Note: Patient unable to have adequate knowledge intake. Family has been educated. Goal: RH STG INCREASE KNOWLEDGE OF STROKE PROPHYLAXIS Description: Patient will be able to manage secondary stroke risks using medications and dietary modification using handouts and educational information with cues/reminders Outcome: Not Progressing Note: Patient unable to have adequate knowledge intake. Family has been educated.

## 2020-01-16 NOTE — Plan of Care (Signed)
  Problem: Consults Goal: RH STROKE PATIENT EDUCATION Description: See Patient Education module for education specifics  Outcome: Progressing   Problem: RH BOWEL ELIMINATION Goal: RH STG MANAGE BOWEL WITH ASSISTANCE Description: STG Manage Bowel with min Assistance. Outcome: Progressing   Problem: RH SKIN INTEGRITY Goal: RH STG SKIN FREE OF INFECTION/BREAKDOWN Description: Manage with min assist Outcome: Progressing Goal: RH STG MAINTAIN SKIN INTEGRITY WITH ASSISTANCE Description: STG Maintain Skin Integrity With min  Assistance. Outcome: Progressing   Problem: RH SAFETY Goal: RH STG ADHERE TO SAFETY PRECAUTIONS W/ASSISTANCE/DEVICE Description: STG Adhere to Safety Precautions With Assistance/Device. Outcome: Progressing
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
## 2020-01-16 NOTE — Progress Notes (Addendum)
[facility] PHYSICAL MEDICINE & REHABILITATION PROGRESS NOTE   Subjective/Complaints: Had a good night. Still feels cold, lost his hat  ROS: Patient denies fever, rash, sore throat, blurred vision, nausea, vomiting, diarrhea, cough, shortness of breath or chest pain, joint or back pain, headache, or mood change.    Objective:   MR BRAIN WO CONTRAST  Result Date: 01/15/2020 CLINICAL DATA:  Stroke, follow-up. EXAM: MRI HEAD WITHOUT CONTRAST TECHNIQUE: Multiplanar, multiecho pulse sequences of the brain and surrounding structures were obtained without intravenous contrast. COMPARISON:  Prior head CT examinations 01/12/2020 and earlier. Brain MRI 12/31/2019. CT angiogram head/neck 12/30/2019. FINDINGS: Brain: The examination is intermittently motion degraded. Most notably, there is moderate/severe motion degradation of the coronal T2 weighted sequence and moderate motion degradation of the sagittal T1 weighted sequence. There is persistent subtle diffusion hyperintensity at site of the known, now subacute infarcts within the right insular cortex, subinsular white matter and right frontal operculum. However, new from the prior MRI of 12/31/2019, there is an acute/early subacute infarct within the right corona radiata measuring 4.5 x 0.8 x 1.6 cm (AP x TV x CC) (for instance as seen on series 5, image 77) (series 7, image 49). New separate smaller acute/early subacute infarcts within the right corona radiata, right subinsular white matter and right lentiform nucleus. New punctate acute/early subacute infarct within the right frontal lobe motor strip (series 5, image 82). Corresponding T2/FLAIR hyperintensity at these sites. Mild multifocal T2/FLAIR hyperintensity within the cerebral white matter elsewhere is nonspecific, but compatible with chronic small vessel ischemic disease. Redemonstrated small chronic lacunar infarct within the left lentiform nucleus. No evidence of intracranial mass. No chronic  intracranial blood products. No extra-axial fluid collection. No midline shift. Vascular: Known high-grade proximal M1 right MCA stenosis demonstrated on the prior CTA head/neck of 12/30/2019. Skull and upper cervical spine: No focal marrow lesion. Sinuses/Orbits: Visualized orbits show no acute finding. Trace ethmoid sinus mucosal thickening. Other: Bilateral mastoid effusions. IMPRESSION: Motion degraded examination as described. New from the prior MRI of 12/31/2019, there is an acute/early subacute infarct within the right corona radiata measuring 4.5 x 1.6 cm. New separate smaller acute/early subacute infarcts within the right corona radiata, right subinsular white matter and right lentiform nucleus. Additional punctate acute/early subacute infarct within the right frontal lobe motor strip. These findings are superimposed upon background known subacute right MCA infarcts. Stable background cerebral atrophy and chronic small vessel ischemic disease. Electronically Signed   By: Kellie Simmering DO   On: 01/15/2020 22:24   Recent Labs    01/16/20 0519  WBC 9.5  HGB 7.4*  HCT 22.9*  PLT 295   Recent Labs    01/15/20 0443 01/16/20 0519  NA 143 142  K 3.8 3.8  CL 106 106  CO2 26 27  GLUCOSE 102* 85  BUN 30* 28*  CREATININE 1.34* 1.37*  CALCIUM 8.2* 8.1*    Intake/Output Summary (Last 24 hours) at 01/16/2020 0915 Last data filed at 01/15/2020 1850 Gross per 24 hour  Intake 210 ml  Output 100 ml  Net 110 ml        Physical Exam: Vital Signs Blood pressure (!) 157/51, pulse 66, temperature 98.6 F (37 C), temperature source Oral, resp. rate 16, height 5\' 7"  (1.702 m), weight 73.1 kg, SpO2 96 %. Constitutional: No distress . Vital signs reviewed. HEENT: EOMI, oral membranes moist Neck: supple Cardiovascular: RRR without murmur. No JVD    Respiratory/Chest: CTA Bilaterally without wheezes or rales. Normal effort  GI/Abdomen: BS +, non-tender, non-distended Ext: no clubbing, cyanosis,  or edema Psych: pleasant and cooperative Skin: right 2nd toe ischemic throughout its length--stable appearance Neuro: Patient is fairly alert in no acute distress.  L CNVII, dysarthric.  Provides name and age.  Follows simple commands. 5/5 strength throughout on right, 2-3/5 LUE, 2+ LLE, less attentive to left side this morning.  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 Atrophy bilateral hand muscles without change. Missing toes.---stable    Assessment/Plan: 1. Functional deficits which require 3+ hours per day of interdisciplinary therapy in a comprehensive inpatient rehab setting.  Physiatrist is providing close team supervision and 24 hour management of active medical problems listed below.  Physiatrist and rehab team continue to assess barriers to discharge/monitor patient progress toward functional and medical goals  Care Tool:  Bathing    Body parts bathed by patient: Right arm, Left arm, Chest, Abdomen, Front perineal area, Right upper leg, Left upper leg, Face   Body parts bathed by helper: Buttocks, Right lower leg, Left lower leg     Bathing assist Assist Level: Maximal Assistance - Patient 24 - 49%     Upper Body Dressing/Undressing Upper body dressing   What is the patient wearing?: Pull over shirt    Upper body assist Assist Level: Maximal Assistance - Patient 25 - 49% (sitting EOB)    Lower Body Dressing/Undressing Lower body dressing      What is the patient wearing?: Pants     Lower body assist Assist for lower body dressing: Maximal Assistance - Patient 25 - 49%     Toileting Toileting    Toileting assist Assist for toileting: 2 Helpers     Transfers Chair/bed transfer  Transfers assist     Chair/bed transfer assist level: Maximal Assistance - Patient 25 - 49%     Locomotion Ambulation   Ambulation assist      Assist level: 2 helpers Assistive device: Hand held assist Max distance: 100   Walk 10 feet  activity   Assist     Assist level: 2 helpers Assistive device: Hand held assist   Walk 50 feet activity   Assist Walk 50 feet with 2 turns activity did not occur: Safety/medical concerns  Assist level: 2 helpers Assistive device: Hand held assist    Walk 150 feet activity   Assist Walk 150 feet activity did not occur: Safety/medical concerns         Walk 10 feet on uneven surface  activity   Assist Walk 10 feet on uneven surfaces activity did not occur: Safety/medical concerns         Wheelchair     Assist Will patient use wheelchair at discharge?: No             Wheelchair 50 feet with 2 turns activity    Assist            Wheelchair 150 feet activity     Assist          Blood pressure (!) 157/51, pulse 66, temperature 98.6 F (37 C), temperature source Oral, resp. rate 16, height 5\' 7"  (1.702 m), weight 73.1 kg, SpO2 96 %.    Medical Problem List and Plan: 1.  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 MRI last night revealed subacute/acute subinsular, corona radiata infarct. Involvement of right frontal lobe too. Spoke with Dr. Leonie Man who will review, see patient. Spoke at length with daughter again today regarding results and plan.   -consider palliative care re-consult this week  -continue therapies as tolerated    2.  Antithrombotics: -DVT/anticoagulation: SCDs             -antiplatelet therapy: Aspirin 81 mg daily and Brilinta 90 mg twice daily x3 months then Brilinta alone 3. Pain Management: Neurontin 300 mg twice daily, Cymbalta 30 mg daily.   -has pain in right foot at night  - scheduled tylenol 4. Mood: Namenda 10 mg twice daily, Aricept 10 mg daily             -antipsychotic agents: N/A 5.  Neuropsych: This patient is capable of making decisions on his own behalf. 6. Skin/Wound Care: Routine skin checks  -kerlix wrap ordered for right foot to keep ischemic toe from catching 7. Fluids/Electrolytes/Nutrition: encourage PO  -will continue IVF until he's more alert, taking in more PO 8.  Dysphagia.  Regular consistency with nectar liquids.  Follow-up speech therapy 9.  Hypertension.  Norvasc 10 mg daily, Coreg 6.25 mg twice daily, Cardura 4 mg daily, hydralazine 100 mg 3 times daily, HCTZ 12.5 mg daily, Imdur 60 mg daily. Systolic BP elevated and diastolic BP low- monitor TID.  -12/5 bp borderline controlled 10.  History of prostate cancer.  Continue Proscar 5 mg daily. 11.  Diabetes mellitus.  Hemoglobin A1c 6.0.  Lantus insulin 20 units daily             11/29: CBG 214 last night. RD consult for dietary education.   12/3 readings higher in PM, increase lantus to 24u qam  12/5 cbg's controlled. Encourage appropriate PO intake 12.  Diastolic congestive heart failure.  Monitor for any signs of fluid overload  12/5-weights trending up a bit, but he hasn't been eating much over last couple days.  Filed Weights   01/10/20 1448 01/14/20 0600 01/15/20 1005  Weight: 71.8 kg 72.2 kg 73.1 kg    13.  History of gout.  Allopurinol 100 mg daily.  Monitor for any gout flareups 14.  Acute on chronic anemia with heme + stool.  Follow-up outpatient GI services for future endoscopy colonoscopy.  Continue Protonix 40 mg daily  12/5-hgb down to 7.4 today. Will transfuse 1u PRBC   -formed stool today, no gross blood   -GI saw patient on acute and deferred endo/colonoscopy unless gross, significant bleeding 15.  Hyperlipidemia.  Lipitor 16.  CAD with PCI.  Continue aspirin and Brilinta for now follow-up outpatient cardiology service.  No current plan for ischemic work-up 17.  CKD stage III.  Follow-up chemistries. Cr 1.30 on 11/29. Up to 1.49 on 12/1.   -IVF HS x 3 nights- without substantial  change   -suspect baseline Cr is 1.3-1.4    -continue IVF given his poor po intake 18. Hypokalemia: K+ 3.8-->continue 70meq daily    LOS: 6 days A FACE TO FACE EVALUATION WAS PERFORMED  Meredith Staggers 01/16/2020, 9:15 AM

## 2020-01-17 ENCOUNTER — Inpatient Hospital Stay (HOSPITAL_COMMUNITY): Payer: Medicare Other

## 2020-01-17 ENCOUNTER — Inpatient Hospital Stay (HOSPITAL_COMMUNITY): Payer: Medicare Other | Admitting: Speech Pathology

## 2020-01-17 DIAGNOSIS — L89152 Pressure ulcer of sacral region, stage 2: Secondary | ICD-10-CM

## 2020-01-17 DIAGNOSIS — R7309 Other abnormal glucose: Secondary | ICD-10-CM

## 2020-01-17 DIAGNOSIS — I63511 Cerebral infarction due to unspecified occlusion or stenosis of right middle cerebral artery: Secondary | ICD-10-CM | POA: Diagnosis not present

## 2020-01-17 DIAGNOSIS — I5032 Chronic diastolic (congestive) heart failure: Secondary | ICD-10-CM

## 2020-01-17 DIAGNOSIS — I10 Essential (primary) hypertension: Secondary | ICD-10-CM

## 2020-01-17 DIAGNOSIS — E1165 Type 2 diabetes mellitus with hyperglycemia: Secondary | ICD-10-CM

## 2020-01-17 DIAGNOSIS — D649 Anemia, unspecified: Secondary | ICD-10-CM

## 2020-01-17 DIAGNOSIS — I639 Cerebral infarction, unspecified: Secondary | ICD-10-CM | POA: Diagnosis not present

## 2020-01-17 DIAGNOSIS — N1832 Chronic kidney disease, stage 3b: Secondary | ICD-10-CM

## 2020-01-17 DIAGNOSIS — E876 Hypokalemia: Secondary | ICD-10-CM

## 2020-01-17 DIAGNOSIS — R4182 Altered mental status, unspecified: Secondary | ICD-10-CM

## 2020-01-17 LAB — TYPE AND SCREEN
ABO/RH(D): O POS
Antibody Screen: NEGATIVE
Unit division: 0

## 2020-01-17 LAB — GLUCOSE, CAPILLARY
Glucose-Capillary: 84 mg/dL (ref 70–99)
Glucose-Capillary: 91 mg/dL (ref 70–99)
Glucose-Capillary: 127 mg/dL — ABNORMAL HIGH (ref 70–99)
Glucose-Capillary: 129 mg/dL — ABNORMAL HIGH (ref 70–99)

## 2020-01-17 LAB — CBC
HCT: 26.2 % — ABNORMAL LOW (ref 39.0–52.0)
Hemoglobin: 8.7 g/dL — ABNORMAL LOW (ref 13.0–17.0)
MCH: 27.6 pg (ref 26.0–34.0)
MCHC: 33.2 g/dL (ref 30.0–36.0)
MCV: 83.2 fL (ref 80.0–100.0)
Platelets: 307 10*3/uL (ref 150–400)
RBC: 3.15 MIL/uL — ABNORMAL LOW (ref 4.22–5.81)
RDW: 16.5 % — ABNORMAL HIGH (ref 11.5–15.5)
WBC: 12.4 10*3/uL — ABNORMAL HIGH (ref 4.0–10.5)
nRBC: 0 % (ref 0.0–0.2)

## 2020-01-17 LAB — BPAM RBC
Blood Product Expiration Date: 202201082359
ISSUE DATE / TIME: 202112051432
Unit Type and Rh: 5100

## 2020-01-17 LAB — BASIC METABOLIC PANEL
Anion gap: 9 (ref 5–15)
BUN: 27 mg/dL — ABNORMAL HIGH (ref 8–23)
CO2: 25 mmol/L (ref 22–32)
Calcium: 8.2 mg/dL — ABNORMAL LOW (ref 8.9–10.3)
Chloride: 106 mmol/L (ref 98–111)
Creatinine, Ser: 1.33 mg/dL — ABNORMAL HIGH (ref 0.61–1.24)
GFR, Estimated: 52 mL/min — ABNORMAL LOW (ref >60)
Glucose, Bld: 85 mg/dL (ref 70–99)
Potassium: 3.9 mmol/L (ref 3.5–5.1)
Sodium: 140 mmol/L (ref 135–145)

## 2020-01-17 IMAGING — DX DG CHEST 1V PORT
1 series · 1 of 1 positions shown · non-contrast
Comparison: [DATE]

CLINICAL DATA: Cough

EXAM:
PORTABLE CHEST 1 VIEW

[chest ap]
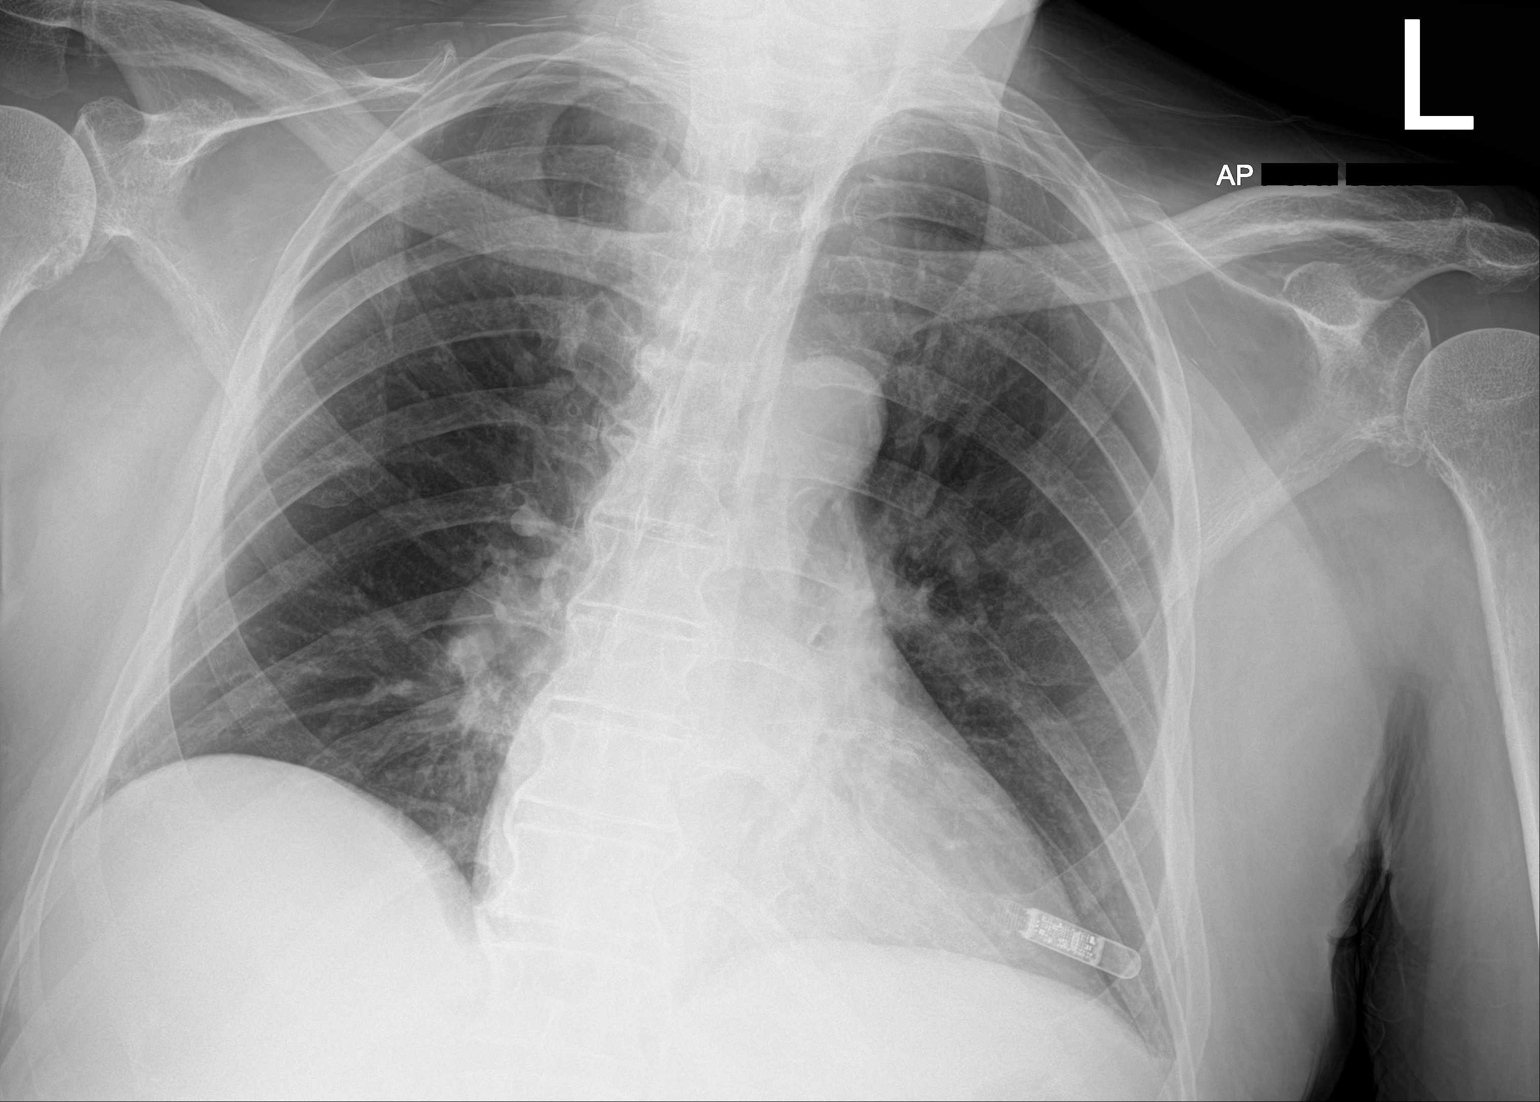

[1 of 1 positions shown; findings below may reference images not displayed]

FINDINGS: Heart size upper limits of normal. Aortic atherosclerotic
calcification. The right lung is clear. Question minimal atelectasis
or scarring at the left base. Evidence of consolidation or lobar
collapse. No visible effusion. No significant bone finding. Loop
recorder in place.
IMPRESSION: 1. Question minimal atelectasis or scarring at the left base. No
consolidation or lobar collapse.
2. Aortic atherosclerotic calcification.

## 2020-01-17 NOTE — Progress Notes (Signed)
Occupational Therapy Session Note  Patient Details  Name: Peter [name] MRN: [id number] Date of Birth: 1930-05-27  Today's Date: 01/17/2020 OT Individual Time: 3578-9784 OT Individual Time Calculation (min): 75 min    Short Term Goals: Week 1:  OT Short Term Goal 1 (Week 1): Pt will transfer to toilet/BSC with Max A of 1 OT Short Term Goal 2 (Week 1): Pt will static stand with no more than Mod A during ADL OT Short Term Goal 3 (Week 1): Pt will perform UB/LB bathing with Mod A with AE PRN OT Short Term Goal 4 (Week 1): Pt will perform sit to stand Mod A to decrease BOC  Skilled Therapeutic Interventions/Progress Updates:    Pt received supine, alert, with obvious L eye and mouth drooping, poor handling of oral secretions. Pt was agreeable to OT session and had no c/o pain. He completed bed mobility to EOB with mod A. He was able to briefly maintain EOB sitting balance with CGA, before requiring mod A for L lean for remainder of ADL EOB. Pt was able to complete oral care with min A and mod cueing for initiation/termination. UB bathing with mod A for reaching under R and L UE. Mod cueing for midline orientation sitting EOB. Pt completed squat pivot to the w/c with max A, mod cueing for technique. Pt was taken to the sink where he continued oral care per his request. Attempted to stand at the sink but was unable to fully stand with max A. Decided to complete oral care from bed level. Set up transfer when pt suddenly became unresponsive, eyes rolling back in head. +3 assist to get pt back in bed. Pt also choking on oral secretions, total A for suction. BP 151/59. HR 61, SpO2 98%. PA entered as well. Pt was incontinent of urine and his brief was dependently changed. Pt was left supine with all needs met, nursing staff in room.   Therapy Documentation Precautions:  Precautions Precautions: Fall Precaution Comments: hx syncope, L lean, hx dementia Restrictions Weight Bearing Restrictions:  No   Therapy/Group: Individual Therapy  Curtis Sites 01/17/2020, 6:42 AM

## 2020-01-17 NOTE — Progress Notes (Signed)
[facility] PHYSICAL MEDICINE & REHABILITATION PROGRESS NOTE   Subjective/Complaints: Patient seen laying in bed this morning.  He states he did not sleep well overnight, but for no particular reason.  He states he wants to go home.  He was seen by neurology yesterday, notes reviewed-unfortunately, limited opportunity for change in treatment.  ROS: Denies CP, SOB, N/V/D  Objective:   CT ANGIO HEAD W OR WO CONTRAST  Result Date: 01/16/2020 CLINICAL DATA:  Stroke suspected. EXAM: CT ANGIOGRAPHY HEAD TECHNIQUE: Multidetector CT imaging of the head was performed using the standard protocol during bolus administration of intravenous contrast. Multiplanar CT image reconstructions and MIPs were obtained to evaluate the vascular anatomy. CONTRAST:  55mL OMNIPAQUE IOHEXOL 350 MG/ML SOLN COMPARISON:  Brain MRI 01/15/2020. Prior noncontrast head CT examinations 01/12/2020 and earlier. CT angiogram head/neck 12/30/2019. FINDINGS: CT HEAD Brain: Mild cerebral atrophy. Redemonstrated acute/subacute infarcts within the right corona radiata, right frontal lobe, right insula/subinsular white matter and right basal ganglia, which appear unchanged as compared to the brain MRI performed yesterday 01/15/2020. No interval demarcated cortical infarct is identified. No evidence of hemorrhagic conversion. Stable background chronic small vessel ischemic disease. No evidence of intracranial mass. No midline shift. Vascular: Reported below. Skull: Normal. Negative for fracture or focal lesion. Sinuses: No significant paranasal sinus disease. Orbits: No mass or acute finding. CTA HEAD Anterior circulation: The intracranial internal carotid arteries are patent. Redemonstrated prominent calcified plaque throughout the intracranial right ICA with multifocal stenoses. Most notably, there is redemonstrated severe stenosis of the cavernous segment. Redemonstrated calcified plaque throughout the intracranial left ICA with no more than mild  to moderate stenosis. Progressive 10 mm long near occlusive stenosis of the M1 right MCA versus segmental occlusion with some distal reconstitution. Some enhancement is seen more distally within the M1 right MCA and right M2 vessels, although markedly diminished as compared to the left and decreased as compared to the prior CT a of 12/30/2019. The M1 left MCA is patent without significant stenosis. No left M2 proximal branch occlusion or high-grade proximal stenosis is identified. The A1 right ACA is hypoplastic with redemonstrated severe proximal stenosis. Posterior circulation: The intracranial vertebral arteries are patent. Atherosclerotic plaque within both vessels. Moderate/severe stenosis on the right. Mild/moderate stenosis on the left. The basilar artery is patent. 1-2 mm outpouching arising from the right P1/P2 junction favored to reflect minimal residual enhancement within an otherwise occluded right posterior communicating artery, unchanged. The posterior cerebral arteries are patent. Predominantly fetal origin of the left posterior cerebral artery. Venous sinuses: Within the limitations of contrast timing, no convincing thrombus. Anatomic variants: As described Impression 1 under the CTA head sectrion below will be called to the ordering clinician or representative by the Radiologist Assistant, and communication documented in the PACS or [employer]. IMPRESSION: CT head: Acute/subacute infarcts within the right corona radiata, right frontal lobe, right insula/subinsular white matter and right basal ganglia appear unchanged as compared to the brain MRI performed yesterday. No interval demarcated cortical infarct is identified. No evidence of hemorrhagic conversion. CTA head: 1. Progressive 10 mm long near occlusive stenosis of the M1 right MCA versus now segmental occlusion with some distal reconstitution. Some enhancement is seen more distally within the M1 and M2 right MCA vessels, although  markedly diminished as compared to the left and significantly decreased as compared to the prior CTA of 12/30/2019. 2. Otherwise, no significant interval change as compared to the prior CTA head of 12/30/2019 as detailed. Electronically Signed   By:  Kellie Simmering DO   On: 01/16/2020 14:42   MR BRAIN WO CONTRAST  Result Date: 01/15/2020 CLINICAL DATA:  Stroke, follow-up. EXAM: MRI HEAD WITHOUT CONTRAST TECHNIQUE: Multiplanar, multiecho pulse sequences of the brain and surrounding structures were obtained without intravenous contrast. COMPARISON:  Prior head CT examinations 01/12/2020 and earlier. Brain MRI 12/31/2019. CT angiogram head/neck 12/30/2019. FINDINGS: Brain: The examination is intermittently motion degraded. Most notably, there is moderate/severe motion degradation of the coronal T2 weighted sequence and moderate motion degradation of the sagittal T1 weighted sequence. There is persistent subtle diffusion hyperintensity at site of the known, now subacute infarcts within the right insular cortex, subinsular white matter and right frontal operculum. However, new from the prior MRI of 12/31/2019, there is an acute/early subacute infarct within the right corona radiata measuring 4.5 x 0.8 x 1.6 cm (AP x TV x CC) (for instance as seen on series 5, image 77) (series 7, image 49). New separate smaller acute/early subacute infarcts within the right corona radiata, right subinsular white matter and right lentiform nucleus. New punctate acute/early subacute infarct within the right frontal lobe motor strip (series 5, image 82). Corresponding T2/FLAIR hyperintensity at these sites. Mild multifocal T2/FLAIR hyperintensity within the cerebral white matter elsewhere is nonspecific, but compatible with chronic small vessel ischemic disease. Redemonstrated small chronic lacunar infarct within the left lentiform nucleus. No evidence of intracranial mass. No chronic intracranial blood products. No extra-axial fluid  collection. No midline shift. Vascular: Known high-grade proximal M1 right MCA stenosis demonstrated on the prior CTA head/neck of 12/30/2019. Skull and upper cervical spine: No focal marrow lesion. Sinuses/Orbits: Visualized orbits show no acute finding. Trace ethmoid sinus mucosal thickening. Other: Bilateral mastoid effusions. IMPRESSION: Motion degraded examination as described. New from the prior MRI of 12/31/2019, there is an acute/early subacute infarct within the right corona radiata measuring 4.5 x 1.6 cm. New separate smaller acute/early subacute infarcts within the right corona radiata, right subinsular white matter and right lentiform nucleus. Additional punctate acute/early subacute infarct within the right frontal lobe motor strip. These findings are superimposed upon background known subacute right MCA infarcts. Stable background cerebral atrophy and chronic small vessel ischemic disease. Electronically Signed   By: Kellie Simmering DO   On: 01/15/2020 22:24   Recent Labs    01/16/20 0519 01/17/20 0626  WBC 9.5 12.4*  HGB 7.4* 8.7*  HCT 22.9* 26.2*  PLT 295 307   Recent Labs    01/16/20 0519 01/17/20 0626  NA 142 140  K 3.8 3.9  CL 106 106  CO2 27 25  GLUCOSE 85 85  BUN 28* 27*  CREATININE 1.37* 1.33*  CALCIUM 8.1* 8.2*    Intake/Output Summary (Last 24 hours) at 01/17/2020 1037 Last data filed at 01/17/2020 0700 Gross per 24 hour  Intake 792 ml  Output 110 ml  Net 682 ml        Physical Exam: Vital Signs Blood pressure (!) 163/52, pulse 67, temperature 98.4 F (36.9 C), resp. rate 20, height 5\' 7"  (1.702 m), weight 73.1 kg, SpO2 96 %. Constitutional: No distress . Vital signs reviewed. HENT: Normocephalic.  Atraumatic. Eyes: EOMI. No discharge. Cardiovascular: No JVD.  RRR. Respiratory: Normal effort.  No stridor.  Bilateral clear to auscultation. GI: Non-distended.  BS +. Skin: Warm and dry.  Stage II coccyx ulcer. Psych: Normal mood.  Normal behavior. Musc:  No edema in extremities.  No tenderness in extremities. Intrinsic muscle atrophy bilateral hands Neuro: Alert and oriented x2 Dysarthria  Follows simple commands.  Motor: RUE/RLE: 4-4+ /5 proximal distal LUE: 4 -/5 proximal distal LLE: 3+-4 -/5 proximal to distal  Assessment/Plan: 1. Functional deficits which require 3+ hours per day of interdisciplinary therapy in a comprehensive inpatient rehab setting.  Physiatrist is providing close team supervision and 24 hour management of active medical problems listed below.  Physiatrist and rehab team continue to assess barriers to discharge/monitor patient progress toward functional and medical goals  Care Tool:  Bathing    Body parts bathed by patient: Right arm, Left arm, Chest, Abdomen, Front perineal area, Right upper leg, Left upper leg, Face   Body parts bathed by helper: Buttocks, Right lower leg, Left lower leg     Bathing assist Assist Level: Maximal Assistance - Patient 24 - 49%     Upper Body Dressing/Undressing Upper body dressing   What is the patient wearing?: Pull over shirt    Upper body assist Assist Level: Maximal Assistance - Patient 25 - 49% (sitting EOB)    Lower Body Dressing/Undressing Lower body dressing      What is the patient wearing?: Pants     Lower body assist Assist for lower body dressing: Maximal Assistance - Patient 25 - 49%     Toileting Toileting    Toileting assist Assist for toileting: 2 Helpers     Transfers Chair/bed transfer  Transfers assist     Chair/bed transfer assist level: 2 Helpers     Locomotion Ambulation   Ambulation assist      Assist level: 2 helpers Assistive device: Hand held assist Max distance: 100   Walk 10 feet activity   Assist     Assist level: 2 helpers Assistive device: Hand held assist   Walk 50 feet activity   Assist Walk 50 feet with 2 turns activity did not occur: Safety/medical concerns  Assist level: 2 helpers Assistive  device: Hand held assist    Walk 150 feet activity   Assist Walk 150 feet activity did not occur: Safety/medical concerns         Walk 10 feet on uneven surface  activity   Assist Walk 10 feet on uneven surfaces activity did not occur: Safety/medical concerns         Wheelchair     Assist Will patient use wheelchair at discharge?: No             Wheelchair 50 feet with 2 turns activity    Assist            Wheelchair 150 feet activity     Assist          Blood pressure (!) 163/52, pulse 67, temperature 98.4 F (36.9 C), resp. rate 20, height 5\' 7"  (1.702 m), weight 73.1 kg, SpO2 96 %.    Medical Problem List and Plan: 1.  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 Involvement of right frontal lobe too.  Appreciate neurology recs.  -consider palliative care re-consult this week  Continue CIR  Will hold off on CT angio at this point. 2.  Antithrombotics: -DVT/anticoagulation: SCDs             -antiplatelet therapy: Aspirin 81 mg daily and Brilinta 90 mg twice daily x3 months then Brilinta alone 3. Pain Management: Neurontin 300 mg twice daily, Cymbalta 30 mg daily.   -  has pain in right foot at night  - scheduled tylenol 4. Mood: Namenda 10 mg twice daily, Aricept 10 mg daily             -antipsychotic agents: N/A 5. Neuropsych: This patient is capable of making decisions on his own behalf. 6. Skin/Wound Care: Routine skin checks  -kerlix wrap ordered for right foot to keep ischemic toe from catching  Continue pressure relief for coccyx ulcer 7. Fluids/Electrolytes/Nutrition: encourage PO  -will continue IVF, limited p.o. intake 8.  Dysphagia.    D2 nectars, advance diet as tolerated 9.  Hypertension.  Norvasc 10 mg daily, Coreg 6.25 mg twice daily, Cardura 4 mg daily,  hydralazine 100 mg 3 times daily, HCTZ 12.5 mg daily, Imdur 60 mg daily.   Elevated on 12/6, allow for permissive hypertension 10.  History of prostate cancer.  Continue Proscar 5 mg daily. 11.  Diabetes mellitus with hyperglycemia.  Hemoglobin A1c 6.0.    lantus to 24u qam  Labile on 12/6, monitor trend 12.  Diastolic congestive heart failure.  Monitor for any signs of fluid overload   Filed Weights   01/10/20 1448 01/14/20 0600 01/15/20 1005  Weight: 71.8 kg 72.2 kg 73.1 kg   No repeat weights since 12/4  13.  History of gout.  Allopurinol 100 mg daily.  Monitor for any gout flareups 14.  Acute on chronic anemia with heme + stool.  Follow-up outpatient GI services for future endoscopy colonoscopy.  Continue Protonix 40 mg daily  Transfused 1 unit PRBC on 12/5  Hemoglobin 8.7 on 12/6  GI saw patient on acute and deferred endo/colonoscopy unless gross, significant bleeding 15.  Hyperlipidemia.  Lipitor 16.  CAD with PCI.  Continue aspirin and Brilinta for now follow-up outpatient cardiology service.  No current plan for ischemic work-up 17.  CKD stage III.  Follow-up chemistries.  Creatinine 1.33 on 12/6  Continue IVF given his poor po intake 18. Hypokalemia: K+ 3.9 on 12/6--> continue 46meq daily    LOS: 7 days A FACE TO FACE EVALUATION WAS PERFORMED  Gail Vendetti Lorie Phenix 01/17/2020, 10:37 AM

## 2020-01-17 NOTE — Progress Notes (Signed)
EEG complete - results pending 

## 2020-01-17 NOTE — Plan of Care (Signed)
Pt's plan of care adjusted to 15/7 after speaking with care team and discussed with PA in team conference as pt currently unable to tolerate current therapy schedule with OT, PT, and SLP.

## 2020-01-17 NOTE — Progress Notes (Signed)
STROKE TEAM PROGRESS NOTE   INTERVAL HISTORY Patient is sitting up in bed.  Vital signs are stable.  Neurological exam is unchanged.  Continues to have left hemiparesis and mild left-sided inattention and field loss.  CT angiogram of the brain now shows Progressive 10 mm long near occlusive stenosis of the M1 right MCA versus segmental occlusion with some distal reconstitution  Vitals:   01/16/20 2113 01/16/20 2342 01/17/20 0329 01/17/20 0956  BP: (!) 174/60 (!) 158/54 (!) 163/52   Pulse:   67   Resp:   20   Temp:   99.8 F (37.7 C) 98.4 F (36.9 C)  TempSrc:   Oral   SpO2:   96%   Weight:      Height:       CBC:  Recent Labs  Lab 01/11/20 0701 01/11/20 0701 01/16/20 0519 01/17/20 0626  WBC 8.4   < > 9.5 12.4*  NEUTROABS 5.9  --   --   --   HGB 8.4*   < > 7.4* 8.7*  HCT 26.2*   < > 22.9* 26.2*  MCV 83.4   < > 84.2 83.2  PLT 292   < > 295 307   < > = values in this interval not displayed.   Basic Metabolic Panel:  Recent Labs  Lab 01/16/20 0519 01/17/20 0626  NA 142 140  K 3.8 3.9  CL 106 106  CO2 27 25  GLUCOSE 85 85  BUN 28* 27*  CREATININE 1.37* 1.33*  CALCIUM 8.1* 8.2*    IMAGING past 24 hours CT ANGIO HEAD W OR WO CONTRAST  Result Date: 01/16/2020 CLINICAL DATA:  Stroke suspected. EXAM: CT ANGIOGRAPHY HEAD TECHNIQUE: Multidetector CT imaging of the head was performed using the standard protocol during bolus administration of intravenous contrast. Multiplanar CT image reconstructions and MIPs were obtained to evaluate the vascular anatomy. CONTRAST:  70mL OMNIPAQUE IOHEXOL 350 MG/ML SOLN COMPARISON:  Brain MRI 01/15/2020. Prior noncontrast head CT examinations 01/12/2020 and earlier. CT angiogram head/neck 12/30/2019. FINDINGS: CT HEAD Brain: Mild cerebral atrophy. Redemonstrated acute/subacute infarcts within the right corona radiata, right frontal lobe, right insula/subinsular white matter and right basal ganglia, which appear unchanged as compared to the  brain MRI performed yesterday 01/15/2020. No interval demarcated cortical infarct is identified. No evidence of hemorrhagic conversion. Stable background chronic small vessel ischemic disease. No evidence of intracranial mass. No midline shift. Vascular: Reported below. Skull: Normal. Negative for fracture or focal lesion. Sinuses: No significant paranasal sinus disease. Orbits: No mass or acute finding. CTA HEAD Anterior circulation: The intracranial internal carotid arteries are patent. Redemonstrated prominent calcified plaque throughout the intracranial right ICA with multifocal stenoses. Most notably, there is redemonstrated severe stenosis of the cavernous segment. Redemonstrated calcified plaque throughout the intracranial left ICA with no more than mild to moderate stenosis. Progressive 10 mm long near occlusive stenosis of the M1 right MCA versus segmental occlusion with some distal reconstitution. Some enhancement is seen more distally within the M1 right MCA and right M2 vessels, although markedly diminished as compared to the left and decreased as compared to the prior CT a of 12/30/2019. The M1 left MCA is patent without significant stenosis. No left M2 proximal branch occlusion or high-grade proximal stenosis is identified. The A1 right ACA is hypoplastic with redemonstrated severe proximal stenosis. Posterior circulation: The intracranial vertebral arteries are patent. Atherosclerotic plaque within both vessels. Moderate/severe stenosis on the right. Mild/moderate stenosis on the left. The basilar artery is patent. 1-2  mm outpouching arising from the right P1/P2 junction favored to reflect minimal residual enhancement within an otherwise occluded right posterior communicating artery, unchanged. The posterior cerebral arteries are patent. Predominantly fetal origin of the left posterior cerebral artery. Venous sinuses: Within the limitations of contrast timing, no convincing thrombus. Anatomic  variants: As described Impression 1 under the CTA head sectrion below will be called to the ordering clinician or representative by the Radiologist Assistant, and communication documented in the PACS or [employer]. IMPRESSION: CT head: Acute/subacute infarcts within the right corona radiata, right frontal lobe, right insula/subinsular white matter and right basal ganglia appear unchanged as compared to the brain MRI performed yesterday. No interval demarcated cortical infarct is identified. No evidence of hemorrhagic conversion. CTA head: 1. Progressive 10 mm long near occlusive stenosis of the M1 right MCA versus now segmental occlusion with some distal reconstitution. Some enhancement is seen more distally within the M1 and M2 right MCA vessels, although markedly diminished as compared to the left and significantly decreased as compared to the prior CTA of 12/30/2019. 2. Otherwise, no significant interval change as compared to the prior CTA head of 12/30/2019 as detailed. Electronically Signed   By: Kellie Simmering DO   On: 01/16/2020 14:42    PHYSICAL EXAM   Frail elderly male not in distress. . Afebrile. Head is nontraumatic. Neck is supple without bruit.    Cardiac exam no murmur or gallop. Lungs are clear to auscultation. Distal pulses are well felt. Neurological Exam Patient is awake alert.  He is oriented to place and person but not to time.  He follows simple midline and one-step commands.  He has diminished attention, registration and recall.  He has right gaze preference but is able to look to the left only still crossing midline.  He blinks to threat on the right but not on the left.  Left lower facial weakness.  Tongue midline.  Motor system exam shows left hemiparesis with 3/5 strength left upper and 2-3/5 in the left lower extremity with left foot drop.  Weakness of left grip and intrinsic hand muscles.  He has left hemibody sensory loss. Only slight left hemiattention on double simultaneous  testing.  He requires 1 person assist to stand and walk.   HOSPITAL COURSE (acute admission) Peter [name] a 86 y.o.malewith history of coronary artery disease, CKD stage III, dementia, diabetes, diastolic dysfunction, peripheral vascular disease, prostate cancer, history of multiple syncopal episodes,presented to the emergency room for evaluation of sudden onset of slurred speech, difficulty swallowing and left-sided weakness. The patient received IV t-PA Friday 12/31/19 at [facility]. Found to have patchy R MCA infarcts d/t R M1 occlusion. Waxing and waning of sx with BP even in the 140-150s. Multiple adjustments to meds. D/C to CIR 01/10/2020. Worsening on CIR. Found to have new R MCA infarct. CTA confirmed R MCA occlusion. Nothing further to add   Stroke: acute R MCA pachy infarcts with R M1 occlusion s/p tPA - cardioembolic vs. Large vessel disease source in 2021.  Neurological worsening due to recurrent R MCA infarct d/t R MCA now complete occlusion.  Consider 30 day cardiac event monitoring as outpt to rule out afib  on Aspirin 81 mg daily and brillinta. Per Dr. Leonie Man, continue DAPT for 3 months, then brillinta alone  CAD s/p stent5/21 Elevated troponin  Troponin 35->50->215>209  EKG no ST-T change  TTE - EF 60 - 65%. No cardiac source of emboli identified.   Cardiology consulted IP - no ischemic  workup planned  On DAPT, continue on d/c  Anemia of chronic disease  Hgb 8.7  FOBTpositive  Iron31, TIBC 189, Sat 16 (all low)and ferritinnormal  Transfused 1 unit as IP  On PPI daily  On iron pill bid  11/18 GI admission for EGD/colonoscopy 2/2 anemia and diarrhea  GIconsulted as IP ( Dr Paulita Fujita ) No procedure recommended - OK to continue DAPT  Optimize HCT and transfuse as needed  Hypertension   On multiple meds  SBP goal 130-150 is setting of R MCA occlusion    Long-term BP goal normotensive  Hyperlipidemia  On lipitor 40   Continue  statin   Diabetestype II Hypoglycemia  HgbA1c 6.0, goal < 7.0  Controlled  Follow up with PCP  Dysphagia  Onregular, nectar thick liquids  Other Stroke Risk Factors  Advanced age  Former cigarette smoker - quit  ETOH use, advised to drink no more than 1 alcoholic beverage per day.  Hx stroke/TIA  Coronary artery diseases/p orbital atherectomy and DES 5/21. Put on DAPT x 12 mo  Other Active Problems  Dementia on Aricept and nemanda - continue   Hx of multiple syncopal episode  CKD - stage 3a  necrotic R foot 2nd toe w/o planned amputation per podiatry, [facility] 12/2019. dsg changes daily  Chronic diarrhea  Deconditioning   Chronic back pain   Hospital day # 7  Continue aspirin and Brilinta for 3 months followed by Brilinta alone and aggressive risk factor modification.  Follow-up as an outpatient stroke clinic in 6 weeks.  Stroke team will sign off.  Kindly call for questions. Antony Contras, MD To contact Stroke Continuity provider, please refer to [email or web address] After hours, contact General Neurology

## 2020-01-17 NOTE — Telephone Encounter (Signed)
Agree, will plan for follow-up once patient discharged from [facility]

## 2020-01-17 NOTE — Progress Notes (Signed)
Physical Therapy Session Note  Patient Details  Name: Peter [name] MRN: [id number] Date of Birth: Apr 12, 1930  Today's Date: 01/17/2020 PT Individual Time: 0086-7619 PT Individual Time Calculation (min): 20 min   Short Term Goals: Week 1:  PT Short Term Goal 1 (Week 1): Pt will ambulate 58ft w/RW and mod assist PT Short Term Goal 2 (Week 1): Pt will transfer bed to/from wc w/RW and mod assist PT Short Term Goal 3 (Week 1): Pt will maintain static sitting balance on edge of bed w/min assist  Skilled Therapeutic Interventions/Progress Updates:     Patient in bed asleep upon PT arrival. Patient aroused to verbal stimulation, spoke a few words to therapist, stating "it's nice to hear your voice," and "I am doing alright" when asked how he was feeling. Voice low and dysarthric. Patient then returned to sleeping and only intermittently opened his eyes. PT checked patient's brief, patient dry and clean. Donned pants with total A in attempt to have patient participate in functional bed level activity without initiation from patient during task. Patient rolling R/L with max-total A to pull pants over his hips. Patient did initiate reaching for the rail with his R hand once roll initiated, otherwise required hand over hand assist for all extremity movements to roll. Repositioned patient by scooting up in the bed with total A +2 and placed a pillow under his L arm for improved positioning. Patient continued to have his eyes closed and remained lethargic with mobility. Patient in bed at end of session with breaks locked, bed alarm set, and all needs within reach. Patient missed 40 min of skilled PT due to lethargy, RN and PA made aware. Will attempt to make-up missed time as able.    Therapy Documentation Precautions:  Precautions Precautions: Fall Precaution Comments: hx syncope, L lean, hx dementia Restrictions Weight Bearing Restrictions: No General: PT Amount of Missed Time (min): 40 Minutes PT  Missed Treatment Reason: Patient fatigue    Therapy/Group: Individual Therapy  Pradeep Beaubrun L Juandavid Dallman PT, DPT  01/17/2020, 11:40 AM

## 2020-01-17 NOTE — Progress Notes (Signed)
Speech Language Pathology Weekly Progress and Session Note  Patient Details  Name: Peter [name] MRN: [id number] Date of Birth: 04-29-1930  Beginning of progress report period: January 10, 2020 End of progress report period: January 17, 2020  Today's Date: 01/17/2020 SLP Individual Time: 1300-1330 SLP Individual Time Calculation (min): 30 min and Today's Date: 01/17/2020 SLP Missed Time: 30 Minutes Missed Time Reason: Patient fatigue  Short Term Goals: Week 1: SLP Short Term Goal 1 (Week 1): Pt will participate in continued cognitive lingustic assessment. SLP Short Term Goal 1 - Progress (Week 1): Met SLP Short Term Goal 2 (Week 1): Pt will demonstrate use of speech intelligibility skills at the phrase level with 80% accuarcy with min A verbal cues. SLP Short Term Goal 2 - Progress (Week 1): Met SLP Short Term Goal 3 (Week 1): Pt will consume regular textures and nectar thick liquids with mod A verbal cues for swallow strategies. SLP Short Term Goal 3 - Progress (Week 1): Not met SLP Short Term Goal 4 (Week 1): Pt will be alert and participate in trials of thin liquids with minimal overt s/s aspiration over x1 prior to placement on water protocol. SLP Short Term Goal 4 - Progress (Week 1): Not met SLP Short Term Goal 5 (Week 1): Pt will recall daily and novel events with mod A verbal cues for use of external aids. SLP Short Term Goal 5 - Progress (Week 1): Met    New Short Term Goals: Week 2: SLP Short Term Goal 1 (Week 2): Pt will demonstrate use of speech intelligibility skills at the phrase level with 90% accuarcy with min A verbal cues. SLP Short Term Goal 2 (Week 2): Pt will recall daily and novel events with min A verbal cues for use of external aids. SLP Short Term Goal 3 (Week 2): Pt will demonstrate sustained attention to functional tasks for 15 minutes with Min verbal cues for redirection. SLP Short Term Goal 4 (Week 2): Patient will consume current diet with minimal overt  s/s of aspiration and Min verbal cues for use of swallowing compensatory strategies. SLP Short Term Goal 5 (Week 2): Patient will consume trials of thin liquids with minimal overt s/s of aspiration over 2 sessions to assess readiness for repeat MBS. SLP Short Term Goal 6 (Week 2): Patient will demonstrate effficient mastication and complete oral clerance of Dys. 3 textures  without overt s/s of aspiration over 2 sessions with Min verbal cues prior to upgrade.  Weekly Progress Updates: Patient has made inconsistent gains and has met 3 of 5 STGs this reporting period. Patient has had fluctuating arousal resulting in increased lethargy and decreased ability to participate in therapy sessions effectively. Due to patient's lethargy and overall weakness, patient's diet was downgraded to Dys. 2 textures with continued nectar-thick liquids. Patient also requires Mod verbal cues for use of speech intelligibility strategies at the phrase level and Mod A verbal cues recall of functional tasks. Patient and family education ongoing. Patient would benefit from continued skilled SLP intervention to maximize his speech, swallowing and cognitive functioning prior to discharge.    Intensity: Minumum of 1-2 x/day, 30 to 90 minutes Frequency: 3 to 5 out of 7 days Duration/Length of Stay: 2 weeks Treatment/Interventions: Dysphagia/aspiration precaution training;Cognitive remediation/compensation;Cueing hierarchy;Functional tasks;Patient/family education;Internal/external aids;Speech/Language facilitation;Environmental controls   Daily Session  Skilled Therapeutic Interventions: Skilled treatment session focused on cognitive goals. Upon arrival, patient was awake in bed but appeared lethargic. SLP administered the [facility] Mental Status Examination ([place])  and patient scored  22/30 points with a score of 27 or above considered normal. Patient demonstrated deficits in short-term recall which was impacted by  overall fatigue. Throughout session, patient demonstrated a congested cough, physician aware. Patient became increasingly lethargic with inability to maintain arousal, therefore, patient missed remaining 30 minutes of session. Patient left upright in bed with alarm on and all needs within reach. Continue with current plan of care.       Pain No/Denies Pain   Therapy/Group: Individual Therapy  Peter [name] 01/17/2020, 6:37 AM

## 2020-01-17 NOTE — Procedures (Signed)
Patient Name: Peter [name]  MRN: [id number]  Epilepsy Attending: Lora Havens  Referring Physician/Provider: Burnetta Sabin, NP Date: 01/17/2020 Duration: 25.07 mins  Patient history: 86 y.o.malewith history of multiple syncopal episodes,presented to the emergency room for evaluation of sudden onset of slurred speech, difficulty swallowing and left-sided weakness. EEG to evaluate for seizure  Level of alertness: Awake, asleep  AEDs during EEG study: GBP  Technical aspects: This EEG study was done with scalp electrodes positioned according to the 10-20 International system of electrode placement. Electrical activity was acquired at a sampling rate of 500Hz  and reviewed with a high frequency filter of 70Hz  and a low frequency filter of 1Hz . EEG data were recorded continuously and digitally stored.   Description: The posterior dominant rhythm consists of 8-9 Hz activity of moderate voltage (25-35 uV) seen predominantly in posterior head regions, symmetric and reactive to eye opening and eye closing. Sleep was characterized by vertex waves, sleep spindles (12 to 14 Hz), maximal frontocentral region. Hyperventilation and photic stimulation were not performed.     IMPRESSION: This study is within normal limits. No seizures or epileptiform discharges were seen throughout the recording.  Leane Loring Barbra Sarks

## 2020-01-17 NOTE — Progress Notes (Signed)
Physical Therapy Note  Patient Details  Name: Peter [name] MRN: [id number] Date of Birth: [birth year] Today's Date: 01/17/2020    Patient in bed asleep with EEG running upon PT arrival. Attending reported that EEG would continue for at least 30 more min. Patient missed 30 min of skilled PT due to EEG, RN made aware. Will attempt to make-up missed time as able.     Ashea Winiarski L Yesenia Locurto PT, DPT  01/17/2020, 4:16 PM

## 2020-01-18 ENCOUNTER — Telehealth: Payer: Self-pay | Admitting: Oncology

## 2020-01-18 ENCOUNTER — Inpatient Hospital Stay (HOSPITAL_COMMUNITY): Payer: Medicare Other

## 2020-01-18 DIAGNOSIS — I10 Essential (primary) hypertension: Secondary | ICD-10-CM | POA: Diagnosis not present

## 2020-01-18 DIAGNOSIS — R7989 Other specified abnormal findings of blood chemistry: Secondary | ICD-10-CM | POA: Diagnosis not present

## 2020-01-18 DIAGNOSIS — E119 Type 2 diabetes mellitus without complications: Secondary | ICD-10-CM | POA: Diagnosis not present

## 2020-01-18 DIAGNOSIS — I63511 Cerebral infarction due to unspecified occlusion or stenosis of right middle cerebral artery: Secondary | ICD-10-CM | POA: Diagnosis not present

## 2020-01-18 LAB — GLUCOSE, CAPILLARY
Glucose-Capillary: 61 mg/dL — ABNORMAL LOW (ref 70–99)
Glucose-Capillary: 82 mg/dL (ref 70–99)
Glucose-Capillary: 109 mg/dL — ABNORMAL HIGH (ref 70–99)
Glucose-Capillary: 94 mg/dL (ref 70–99)
Glucose-Capillary: 145 mg/dL — ABNORMAL HIGH (ref 70–99)

## 2020-01-18 MED ORDER — SODIUM CHLORIDE 0.45 % IV SOLN: INTRAVENOUS | Status: DC

## 2020-01-18 MED ORDER — METHYLPHENIDATE HCL 5 MG PO TABS
2.5000 mg | ORAL_TABLET | Freq: Two times a day (BID) | ORAL | Status: DC
  Administered 2020-01-19 – 2020-01-20 (×3): 2.5 mg via ORAL
  Filled 2020-01-18 (×3): qty 1

## 2020-01-18 MED ORDER — TRAZODONE HCL 50 MG PO TABS
25.0000 mg | ORAL_TABLET | Freq: Every evening | ORAL | Status: DC | PRN
  Administered 2020-01-30: 25 mg via ORAL
  Filled 2020-01-18: qty 1

## 2020-01-18 NOTE — Progress Notes (Signed)
Patient ID: Peter [name], male   DOB: 01-Oct-1930, 86 y.o.   MRN: [id number]  SW met with pt, pt wife, and pt dtr Peter [name] to provide updates from team conference, and d/c date 12/28. Plan is ultimately to take pt home. Dtr confirms she spoke with attending. No specific concerns at this time, however, if she has concerns will speak with nursing or SW to ask if therapy can follow-up. SW to continue to provide updates.   [name], MSW, [place] Office: [phone] Cell: [phone] Fax: [phone]

## 2020-01-18 NOTE — Plan of Care (Signed)
  Problem: RH Balance Goal: LTG Patient will maintain dynamic sitting balance (PT) Description: LTG:  Patient will maintain dynamic sitting balance with assistance during mobility activities (PT) Flowsheets (Taken 01/18/2020 1623) LTG: Pt will maintain dynamic sitting balance during mobility activities with:: (Downgraded goals due to change in status and functional decline with new infarction.) Minimal Assistance - Patient > 75% Note: Downgraded goals due to change in status and functional decline with new infarction. Goal: LTG Patient will maintain dynamic standing balance (PT) Description: LTG:  Patient will maintain dynamic standing balance with assistance during mobility activities (PT) Flowsheets (Taken 01/18/2020 1623) LTG: Pt will maintain dynamic standing balance during mobility activities with:: (Downgraded goals due to change in status and functional decline with new infarction.) Moderate Assistance - Patient 50 - 74% Note: Downgraded goals due to change in status and functional decline with new infarction.   Problem: Sit to Stand Goal: LTG:  Patient will perform sit to stand with assistance level (PT) Description: LTG:  Patient will perform sit to stand with assistance level (PT) Flowsheets (Taken 01/18/2020 1623) LTG: PT will perform sit to stand in preparation for functional mobility with assistance level: (Downgraded goals due to change in status and functional decline with new infarction.) Moderate Assistance - Patient 50 - 74% Note: Downgraded goals due to change in status and functional decline with new infarction.   Problem: RH Bed Mobility Goal: LTG Patient will perform bed mobility with assist (PT) Description: LTG: Patient will perform bed mobility with assistance, with/without cues (PT). Flowsheets (Taken 01/18/2020 1623) LTG: Pt will perform bed mobility with assistance level of: (Downgraded goals due to change in status and functional decline with new infarction.) Moderate  Assistance - Patient 50 - 74% Note: Downgraded goals due to change in status and functional decline with new infarction.   Problem: RH Bed to Chair Transfers Goal: LTG Patient will perform bed/chair transfers w/assist (PT) Description: LTG: Patient will perform bed to chair transfers with assistance (PT). Flowsheets (Taken 01/18/2020 1623) LTG: Pt will perform Bed to Chair Transfers with assistance level: (Downgraded goals due to change in status and functional decline with new infarction.) Moderate Assistance - Patient 50 - 74% Note: Downgraded goals due to change in status and functional decline with new infarction.   Problem: RH Car Transfers Goal: LTG Patient will perform car transfers with assist (PT) Description: LTG: Patient will perform car transfers with assistance (PT). Flowsheets (Taken 01/18/2020 1623) LTG: Pt will perform car transfers with assist:: (Downgraded goals due to change in status and functional decline with new infarction.) Moderate Assistance - Patient 50 - 74% Note: Downgraded goals due to change in status and functional decline with new infarction.   Problem: RH Ambulation Goal: LTG Patient will ambulate in controlled environment (PT) Description: LTG: Patient will ambulate in a controlled environment, # of feet with assistance (PT). Flowsheets Taken 01/18/2020 1623 by Eustace Pen, Callaway Hailes L, PT LTG: Pt will ambulate in controlled environ  assist needed:: (Downgraded goals due to change in status and functional decline with new infarction.) Maximal Assistance - Patient 25 - 49% Taken 01/11/2020 1737 by Jerrilyn Cairo, PT LTG: Ambulation distance in controlled environment: 50 Note: Downgraded goals due to change in status and functional decline with new infarction.

## 2020-01-18 NOTE — Progress Notes (Addendum)
Occupational Therapy Session Note  Patient Details  Name: Peter [name] MRN: [id number] Date of Birth: [birth year]  Today's Date: 01/18/2020 OT Individual Time: 0905-1000 OT Individual Time Calculation (min): 55 min   Session 2: OT Individual Time: 1430-1445 OT Individual Time Calculation (min): 15 min    Short Term Goals: Week 1:  OT Short Term Goal 1 (Week 1): Pt will transfer to toilet/BSC with Max A of 1 OT Short Term Goal 2 (Week 1): Pt will static stand with no more than Mod A during ADL OT Short Term Goal 3 (Week 1): Pt will perform UB/LB bathing with Mod A with AE PRN OT Short Term Goal 4 (Week 1): Pt will perform sit to stand Mod A to decrease BOC  Skilled Therapeutic Interventions/Progress Updates:    Pt supine and alert, conversing with OT appropriately. Complaints of pain in his R toes, unrated and no request for intervention. Pt BP assessed while supine- 153/48. Brief was checked and he was incontinent of BM and urine. He rolled to the R with min A while total A peri hygiene was performed. Pt required max A to roll L. Pt's wife called and he was given the phone, he fell asleep quickly and was difficult to arouse. Physical initiation performed to increase arousal to start transfer to EOB. Max A to come EOB. Pt sat EOB with intermittent bouts of CGA with heavy cueing for midline orientation, great improvement when RUE holds onto bed rail. Overall more like mod A for sitting balance. Pt completed oral care with cueing for initiation and for midline orientation. Max A for shampoo cap use and brushing of hair. Pt returned to supine in bed with max A. Pt was positioned on his R side to promote pressure relief for his sacrum. Bed alarm set.   Session 2: Pt received supine with no c/o pain. Pt agreeable to make up session with PT co-treat. Brief checked to ensure continence. Pt completed bed mobility to EOB with max A. He was able to maintain sitting balance EOB with min A with cueing  for midline orientation. Pt completed sit > stand from EOB with mod +2 assist. Pt completed 3 musketeers functional mobility, mod-max A +2. Initially pt able to advance LLE but requiring mod, fading to max A by end. Pt was left in TIS w/c with PT attending to needs.   Therapy Documentation Precautions:  Precautions Precautions: Fall Precaution Comments: hx syncope, L lean, hx dementia Restrictions Weight Bearing Restrictions: No  Therapy/Group: Individual Therapy  Curtis Sites 01/18/2020, 6:50 AM

## 2020-01-18 NOTE — Patient Care Conference (Signed)
Inpatient RehabilitationTeam Conference and Plan of Care Update Date: 01/18/2020   Time: 10:05 AM    Patient Name: Peter [name]      Medical Record Number: [id number]  Date of Birth: [birth year] Sex: Male         Room/Bed: 4W09C/4W09C-01 Payor Info: Payor: MEDICARE / Plan: MEDICARE PART A AND B / Product Type: *No Product type* /    Admit Date/Time:  01/10/2020  2:41 PM  Primary Diagnosis:  Right middle cerebral artery stroke [facility])  Hospital Problems: Principal Problem:   Right middle cerebral artery stroke ([place]) Active Problems:   Malnutrition of moderate degree   Essential hypertension   Decubitus ulcer of coccyx, stage II ([place])   Controlled type 2 diabetes mellitus with hyperglycemia, without long-term current use of insulin ([place])   Labile blood glucose   Hypokalemia   Acute on chronic anemia   Chronic diastolic congestive heart failure ([place])   Stage 3b chronic kidney disease [facility])    Expected Discharge Date: Expected Discharge Date: 02/08/20  Team Members Present: Physician leading conference: Dr. Alger Simons Care Coodinator Present: [name], LCSWA;Nolawi Kanady Creig Hines, RN, BSN, [place] Nurse Present: Rayne Du, LPN PT Present: Apolinar Junes, PT OT Present: Laverle Hobby, OT SLP Present: Weston Anna, SLP PPS Coordinator present : Ileana Ladd, Burna Mortimer, SLP     Current Status/Progress Goal Weekly Team Focus  Bowel/Bladder   Incontinent of bowel and bladder; LBM 01/18/2020  Reduce incontinent episodes  Toileting assistance q 2-3 hours and PRN   Swallow/Nutrition/ Hydration   Dys. 2 textures with nectar-thick liquids, Mod A  Mod I  Tolerance of current diet, use of swallowing strategies   ADL's   Mod A sitting balance, mod A UB ADLs, max-total A bed level LB ADLs, max A +1-+2 squat pivot transfers  min A- CGA  Arousal, sitting balance, LUE NMR, ADL retraining/transfers   Mobility   Decline in functional mobility and decreased arousal due  to new infarcts, requires mod-total A bed mobility, fluctuates based on arousal, max-total A of 1-2 for transfers, was ambulating 100 ft on 12/3  Min A-CGA overall (will downgrade based on d/c plan due to change in functional status)  Arousal, activity tolerance, bed mobility and transfers, gait as able, L attention, L upper/lower extremity NMR, balance, patient/caregiver education   Communication   Mod-Max A  Mod I  use of speech intelligibility strategies   Safety/Cognition/ Behavioral Observations  Mod A  Supervision  attention, recall   Pain   No c/o pain  Pain < or equal to 3  Assess pain q shift and prn   Skin   R foot necrotic toe/ Skin breakdown to sacrum  Prevent skin from breakdown or infection  Assess skin q shift and prn     Discharge Planning:  Pt dtr [name] reports they currently have a private aide that helps 7days per week 12pm-6pm in which she was only proving supervision level of care and assistsanace with IADLs. Reports pt was independent prior to admission. Pt currently established with [facility] for HHPT.   Team Discussion: MD reports that the patient's stroke extended over the weekend, he received a unit of RBC's, his intake has worsened, and his speech has worsened. Nursing reports patient ate some breakfast today. Continent B/B. MASD to his bottom. OT reports patient has had a big functional decline, not as alert this week, mod assist for sitting, mod/max assist for ADL's. PT reports patient has had a decline and is difficult to  arouse. Mod to max assist for transfers. SLP reports patient is on Dys 2 with nectar thick liquids and takes a long time to eat a meal. Developed a cough over the weekend as well.  Patient on target to meet rehab goals: yes  *See Care Plan and progress notes for long and short-term goals.   Revisions to Treatment Plan:  Not at this time.  Teaching Needs: Continue family education.  Current Barriers to Discharge: Decreased caregiver  support, Medical stability, Home enviroment access/layout, Wound care, Lack of/limited family support and Nutritional means  Possible Resolutions to Barriers: Continue current medications, educate on wound care and dressing changes, provide emotional support to patient and family, provide nutritional supplementation.     Medical Summary Current Status: right MCA infarct with extension d/t occlusion. worsened left hemiparesis and inattention/visual deficits. heme +/GI blood loss. poor po intake  Barriers to Discharge: Medical stability   Possible Resolutions to [employer] Focus: close neuro monitoring, daily lab review, improve nutrition, blood tranfusion   Continued Need for Acute Rehabilitation Level of Care: The patient requires daily medical management by a physician with specialized training in physical medicine and rehabilitation for the following reasons: Direction of a multidisciplinary physical rehabilitation program to maximize functional independence : Yes Medical management of patient stability for increased activity during participation in an intensive rehabilitation regime.: Yes Analysis of laboratory values and/or radiology reports with any subsequent need for medication adjustment and/or medical intervention. : Yes   I attest that I was present, lead the team conference, and concur with the assessment and plan of the team.   Cristi Loron 01/18/2020, 1:02 PM

## 2020-01-18 NOTE — Progress Notes (Signed)
[facility] [facility] PHYSICAL MEDICINE & REHABILITATION PROGRESS NOTE   Subjective/Complaints: Had a little better night. Right foot/toe still gives him problems. Talking to daughter when I entered. Asked about his stroke and why it happened  ROS: Patient denies fever, rash, sore throat, blurred vision, nausea, vomiting, diarrhea, cough, shortness of breath or chest pain,   headache, or mood change.    Objective:   CT ANGIO HEAD W OR WO CONTRAST  Result Date: 01/16/2020 CLINICAL DATA:  Stroke suspected. EXAM: CT ANGIOGRAPHY HEAD TECHNIQUE: Multidetector CT imaging of the head was performed using the standard protocol during bolus administration of intravenous contrast. Multiplanar CT image reconstructions and MIPs were obtained to evaluate the vascular anatomy. CONTRAST:  22mL OMNIPAQUE IOHEXOL 350 MG/ML SOLN COMPARISON:  Brain MRI 01/15/2020. Prior noncontrast head CT examinations 01/12/2020 and earlier. CT angiogram head/neck 12/30/2019. FINDINGS: CT HEAD Brain: Mild cerebral atrophy. Redemonstrated acute/subacute infarcts within the right corona radiata, right frontal lobe, right insula/subinsular white matter and right basal ganglia, which appear unchanged as compared to the brain MRI performed yesterday 01/15/2020. No interval demarcated cortical infarct is identified. No evidence of hemorrhagic conversion. Stable background chronic small vessel ischemic disease. No evidence of intracranial mass. No midline shift. Vascular: Reported below. Skull: Normal. Negative for fracture or focal lesion. Sinuses: No significant paranasal sinus disease. Orbits: No mass or acute finding. CTA HEAD Anterior circulation: The intracranial internal carotid arteries are patent. Redemonstrated prominent calcified plaque throughout the intracranial right ICA with multifocal stenoses. Most notably, there is redemonstrated severe stenosis of the cavernous segment. Redemonstrated calcified plaque throughout the intracranial left  ICA with no more than mild to moderate stenosis. Progressive 10 mm long near occlusive stenosis of the M1 right MCA versus segmental occlusion with some distal reconstitution. Some enhancement is seen more distally within the M1 right MCA and right M2 vessels, although markedly diminished as compared to the left and decreased as compared to the prior CT a of 12/30/2019. The M1 left MCA is patent without significant stenosis. No left M2 proximal branch occlusion or high-grade proximal stenosis is identified. The A1 right ACA is hypoplastic with redemonstrated severe proximal stenosis. Posterior circulation: The intracranial vertebral arteries are patent. Atherosclerotic plaque within both vessels. Moderate/severe stenosis on the right. Mild/moderate stenosis on the left. The basilar artery is patent. 1-2 mm outpouching arising from the right P1/P2 junction favored to reflect minimal residual enhancement within an otherwise occluded right posterior communicating artery, unchanged. The posterior cerebral arteries are patent. Predominantly fetal origin of the left posterior cerebral artery. Venous sinuses: Within the limitations of contrast timing, no convincing thrombus. Anatomic variants: As described Impression 1 under the CTA head sectrion below will be called to the ordering clinician or representative by the Radiologist Assistant, and communication documented in the PACS or [employer]. IMPRESSION: CT head: Acute/subacute infarcts within the right corona radiata, right frontal lobe, right insula/subinsular white matter and right basal ganglia appear unchanged as compared to the brain MRI performed yesterday. No interval demarcated cortical infarct is identified. No evidence of hemorrhagic conversion. CTA head: 1. Progressive 10 mm long near occlusive stenosis of the M1 right MCA versus now segmental occlusion with some distal reconstitution. Some enhancement is seen more distally within the M1 and M2 right  MCA vessels, although markedly diminished as compared to the left and significantly decreased as compared to the prior CTA of 12/30/2019. 2. Otherwise, no significant interval change as compared to the prior CTA head of 12/30/2019 as detailed. Electronically Signed  By: Kellie Simmering DO   On: 01/16/2020 14:42   DG Chest Port 1 View  Result Date: 01/17/2020 CLINICAL DATA:  Cough EXAM: PORTABLE CHEST 1 VIEW COMPARISON:  01/09/2020 FINDINGS: Heart size upper limits of normal. Aortic atherosclerotic calcification. The right lung is clear. Question minimal atelectasis or scarring at the left base. Evidence of consolidation or lobar collapse. No visible effusion. No significant bone finding. Loop recorder in place. IMPRESSION: 1. Question minimal atelectasis or scarring at the left base. No consolidation or lobar collapse. 2. Aortic atherosclerotic calcification. Electronically Signed   By: Nelson Chimes M.D.   On: 01/17/2020 12:52   EEG adult  Result Date: 01/17/2020 Lora Havens, MD     01/17/2020  4:19 PM Patient Name: Peter [name] MRN: [id number] Epilepsy Attending: Lora Havens Referring Physician/Provider: Burnetta Sabin, NP Date: 01/17/2020 Duration: 25.07 mins Patient history: 86 y.o.malewith history of multiple syncopal episodes,presented to the emergency room for evaluation of sudden onset of slurred speech, difficulty swallowing and left-sided weakness. EEG to evaluate for seizure Level of alertness: Awake, asleep AEDs during EEG study: GBP Technical aspects: This EEG study was done with scalp electrodes positioned according to the 10-20 International system of electrode placement. Electrical activity was acquired at a sampling rate of 500Hz  and reviewed with a high frequency filter of 70Hz  and a low frequency filter of 1Hz . EEG data were recorded continuously and digitally stored. Description: The posterior dominant rhythm consists of 8-9 Hz activity of moderate voltage (25-35 uV) seen  predominantly in posterior head regions, symmetric and reactive to eye opening and eye closing. Sleep was characterized by vertex waves, sleep spindles (12 to 14 Hz), maximal frontocentral region. Hyperventilation and photic stimulation were not performed.   IMPRESSION: This study is within normal limits. No seizures or epileptiform discharges were seen throughout the recording. Lora Havens   Recent Labs    01/16/20 0519 01/17/20 0626  WBC 9.5 12.4*  HGB 7.4* 8.7*  HCT 22.9* 26.2*  PLT 295 307   Recent Labs    01/16/20 0519 01/17/20 0626  NA 142 140  K 3.8 3.9  CL 106 106  CO2 27 25  GLUCOSE 85 85  BUN 28* 27*  CREATININE 1.37* 1.33*  CALCIUM 8.1* 8.2*    Intake/Output Summary (Last 24 hours) at 01/18/2020 0944 Last data filed at 01/18/2020 0600 Gross per 24 hour  Intake 988.82 ml  Output --  Net 988.82 ml        Physical Exam: Vital Signs Blood pressure (!) 166/48, pulse 60, temperature 98.1 F (36.7 C), temperature source Oral, resp. rate 20, height 5\' 7"  (1.702 m), weight 73.1 kg, SpO2 96 %. Constitutional: No distress . Vital signs reviewed. HEENT: EOMI, oral membranes moist Neck: supple Cardiovascular: RRR without murmur. No JVD    Respiratory/Chest: CTA Bilaterally without wheezes or rales. Normal effort    GI/Abdomen: BS +, non-tender, non-distended Ext: no clubbing, cyanosis, or edema Psych: pleasant and cooperative Skin: Warm and dry.  MASD near sacrum Psych: Normal mood.  Normal behavior. Musc: No edema in extremities.  No tenderness in extremities. Intrinsic muscle atrophy bilateral hands Neuro: more alert, oriented to person, place, reason, month Dysarthric, left central 7 Follows simple commands.  Motor: RUE/RLE: 4-4+ /5 proximal distal LUE: 3-/5 proximal distal LLE: 2+ to 3-/5 proximal to distal  Assessment/Plan: 1. Functional deficits which require 3+ hours per day of interdisciplinary therapy in a comprehensive inpatient rehab  setting.  Physiatrist is providing close team  supervision and 24 hour management of active medical problems listed below.  Physiatrist and rehab team continue to assess barriers to discharge/monitor patient progress toward functional and medical goals  Care Tool:  Bathing    Body parts bathed by patient: Right arm, Left arm, Chest, Abdomen, Front perineal area, Right upper leg, Left upper leg, Face   Body parts bathed by helper: Buttocks, Right lower leg, Left lower leg     Bathing assist Assist Level: Maximal Assistance - Patient 24 - 49%     Upper Body Dressing/Undressing Upper body dressing   What is the patient wearing?: Pull over shirt    Upper body assist Assist Level: Maximal Assistance - Patient 25 - 49% (sitting EOB)    Lower Body Dressing/Undressing Lower body dressing      What is the patient wearing?: Pants     Lower body assist Assist for lower body dressing: Maximal Assistance - Patient 25 - 49%     Toileting Toileting    Toileting assist Assist for toileting: 2 Helpers     Transfers Chair/bed transfer  Transfers assist     Chair/bed transfer assist level: 2 Helpers     Locomotion Ambulation   Ambulation assist      Assist level: 2 helpers Assistive device: Hand held assist Max distance: 100   Walk 10 feet activity   Assist     Assist level: 2 helpers Assistive device: Hand held assist   Walk 50 feet activity   Assist Walk 50 feet with 2 turns activity did not occur: Safety/medical concerns  Assist level: 2 helpers Assistive device: Hand held assist    Walk 150 feet activity   Assist Walk 150 feet activity did not occur: Safety/medical concerns         Walk 10 feet on uneven surface  activity   Assist Walk 10 feet on uneven surfaces activity did not occur: Safety/medical concerns         Wheelchair     Assist Will patient use wheelchair at discharge?: No             Wheelchair 50 feet with 2  turns activity    Assist            Wheelchair 150 feet activity     Assist          Blood pressure (!) 166/48, pulse 60, temperature 98.1 F (36.7 C), temperature source Oral, resp. rate 20, height 5\' 7"  (1.702 m), weight 73.1 kg, SpO2 96 %.    Medical Problem List and Plan: 1.  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 Involvement of right frontal lobe too.  Appreciate neurology recs.  -consider palliative care re-consult this week  -team conference today. Will need to reset goals, LOS given extension of stroke.  -appreciate neurology help.  2.  Antithrombotics: -DVT/anticoagulation: SCDs             -antiplatelet therapy: Aspirin 81 mg daily and Brilinta 90 mg twice daily x3 months then Brilinta alone 3. Pain Management: Neurontin 300 mg twice daily, Cymbalta 30 mg daily.   -has pain in right foot at night  - scheduled tylenol 4. Mood: Namenda 10 mg twice daily, Aricept 10 mg daily             -antipsychotic agents: N/A 5.  Neuropsych: This patient is capable of making decisions on his own behalf. 6. Skin/Wound Care: Routine skin checks  -kerlix wrap ordered for right foot to keep ischemic toe from catching  Continue pressure relief for coccyx ulcer 7. Fluids/Electrolytes/Nutrition: family/staff to encourage PO  -will run IVF at bedtime 8.  Dysphagia.    D2 nectars, advance diet as tolerated 9.  Hypertension.  Norvasc 10 mg daily, Coreg 6.25 mg twice daily, Cardura 4 mg daily, hydralazine 100 mg 3 times daily, HCTZ 12.5 mg daily, Imdur 60 mg daily.   Elevated on 12/7, allow for permissive hypertension 10.  History of prostate cancer.  Continue Proscar 5 mg daily. 11.  Diabetes mellitus with hyperglycemia.  Hemoglobin A1c 6.0.    lantus to 24u qam  Lower readings given decreased to inconsistent  po intake 12.  Diastolic congestive heart failure.  Monitor for any signs of fluid overload   Filed Weights   01/10/20 1448 01/14/20 0600 01/15/20 1005  Weight: 71.8 kg 72.2 kg 73.1 kg   Need weights every other day  13.  History of gout.  Allopurinol 100 mg daily.  Monitor for any gout flareups 14.  Acute on chronic anemia with heme + stool.  Follow-up outpatient GI services for future endoscopy colonoscopy.  Continue Protonix 40 mg daily  Transfused 1 unit PRBC on 12/5  Hemoglobin 8.7 on 12/6  GI saw patient on acute and deferred endo/colonoscopy unless gross, significant bleeding 15.  Hyperlipidemia.  Lipitor 16.  CAD with PCI.  Continue aspirin and Brilinta for now follow-up outpatient cardiology service.  No current plan for ischemic work-up 17.  CKD stage III.  Follow-up chemistries.  Creatinine 1.33 on 12/6  Run IVF at [facility] given his poor po intake 18. Hypokalemia: K+ 3.9 on 12/6--> continue 13meq daily    LOS: 8 days A FACE TO FACE EVALUATION WAS PERFORMED  Meredith Staggers 01/18/2020, 9:44 AM

## 2020-01-18 NOTE — Progress Notes (Signed)
Nutrition Follow-up  DOCUMENTATION CODES:   Non-severe (moderate) malnutrition in context of chronic illness  INTERVENTION:   If PO intake remains poor, recommend placement of Cortrak NG tube and initiation of enteral nutrition if within pt's GOC. Recommend: - Osmolite 1.5 @ 55 ml/hr (1320 ml/day) - ProSource TF 45 ml BID  Recommended tube feeding regimen would provide 2060 kcal, 105 grams of protein, and 1006 ml of H2O.   - Continue Magic Cup TID with meals, each supplement provides 290 kcal and 9 grams of protein  - Continue Vital Cuisine Shake BID, each supplement provides 520 kcal and 22 grams of protein  - Continue to encourage adequate PO intake and provide feeding assistance with meals  NUTRITION DIAGNOSIS:   Severe Malnutrition related to chronic illness (dementia, CHF, prostate cancer) as evidenced by moderate fat depletion, severe muscle depletion, percent weight loss (10.4% weight loss in 6 months).  Ongoing  GOAL:   Patient will meet greater than or equal to 90% of their needs  Unmet  MONITOR:   PO intake, Supplement acceptance, Diet advancement, Labs, Weight trends, Skin, I & O's  REASON FOR ASSESSMENT:   Consult Poor PO  ASSESSMENT:   86 year old male with PMH of CAD/PCI, CKD stage III, dementia, DM, CHF, HTN, HLD, PVD, prostate cancer. Presented 12/30/19 with left-sided weakness and slurred speech. Pt found to have acute right MCA stroke. Admitted to CIR on 11/29.  Per notes, pt had an extension of his stroke over the weekend.  Discussed pt with RN. Per RN, pt ate ~50% of breakfast meal this AM but did not eat well yesterday when RN had him. Pt had consumed ~25% of lunch meal tray when RD was in room. Per RN, SLP in room assisting pt in finishing lunch meal tray.  Spoke with pt at bedside. Pt not answering questions appropriately. Pt stated, "you're just in time" and pointed to the television. Pt unable to answer RD questions.  Discussed  recommendation for Cortrak and enteral nutrition support with MD. Per MD, pt may be a candidate for this but likely will not want it. MD considering appetite stimulant. RD to leave TF recommendations for use if Cortrak placed. Recommend GOC discussion with Palliative Medicine.  Last weight available is from 12/04. Pt with a 3 lb weight gain from 11/29 to 12/04.  Meal Completion: 0-65% x last 8 documented meals  Medications reviewed and include: SSI, lantus 20 units daily, ritalin, protonix, klor-con IVF: 1/2 NS @ 50 ml/hr q HS  Labs reviewed: BUN 27, creatinine 1.33, hemoglobin 8.7 CBG's: 61-129 x 24 hours  Diet Order:   Diet Order            DIET DYS 2 Room service appropriate? Yes; Fluid consistency: Nectar Thick  Diet effective now                 EDUCATION NEEDS:   Not appropriate for education at this time  Skin:  Skin Assessment: Skin Integrity Issues: Diabetic Ulcer: right middle toe Other: MASD to medial sacrum  Last BM:  01/18/20 large type 6  Height:   Ht Readings from Last 1 Encounters:  01/10/20 5\' 7"  (1.702 m)    Weight:   Wt Readings from Last 1 Encounters:  01/15/20 73.1 kg    BMI:  Body mass index is 25.24 kg/m.  Estimated Nutritional Needs:   Kcal:  1900-2100  Protein:  100-115 grams  Fluid:  2.0 L/day    Gustavus Bryant, MS, RD, LDN Inpatient  Clinical Dietitian Please see AMi[facility] for contact information.

## 2020-01-18 NOTE — Progress Notes (Signed)
Speech Language Pathology Daily Session Note  Patient Details  Name: Lucas Winograd MRN: [id number] Date of Birth: 08/04/1930  Today's Date: 01/18/2020 SLP Individual Time: 1347-1430 SLP Individual Time Calculation (min): 43 min  Short Term Goals: Week 2: SLP Short Term Goal 1 (Week 2): Pt will demonstrate use of speech intelligibility skills at the phrase level with 90% accuarcy with min A verbal cues. SLP Short Term Goal 2 (Week 2): Pt will recall daily and novel events with min A verbal cues for use of external aids. SLP Short Term Goal 3 (Week 2): Pt will demonstrate sustained attention to functional tasks for 15 minutes with Min verbal cues for redirection. SLP Short Term Goal 4 (Week 2): Patient will consume current diet with minimal overt s/s of aspiration and Min verbal cues for use of swallowing compensatory strategies. SLP Short Term Goal 5 (Week 2): Patient will consume trials of thin liquids with minimal overt s/s of aspiration over 2 sessions to assess readiness for repeat MBS. SLP Short Term Goal 6 (Week 2): Patient will demonstrate effficient mastication and complete oral clerance of Dys. 3 textures  without overt s/s of aspiration over 2 sessions with Min verbal cues prior to upgrade.  Skilled Therapeutic Interventions:Skilled ST services focused on swallow skills. Pt was initially confused with orientation to time, place and situation, but at the end of the session and following education, pt was able to recall all expect for date. SLP facilitated oral care set up, pt preformed less than thorough oral care and allowed SLP to complete task. Pt consumed thin liquids via cup sips with mild anterior spillage and initial oral holding which decreased to only supervision A verbal cues to initiate swallow. Pt demonstrated no overt s/s aspiration and vocal quality appeared clear. SLP recommends instrumental swallow assessment to assess diet advancement readiness at the discretion of the  primary SLP. Pt demonstrated sustained attention in 15 minute intervals with supervision A verbal cues. Pt was left in room with call bell within reach and bed alarm set. SLP recommends to continue skilled services.     Pain Pain Assessment Pain Score: 0-No pain  Therapy/Group: Individual Therapy  Stacy Deshler  [facility] 01/18/2020, 2:59 PM

## 2020-01-18 NOTE — Progress Notes (Signed)
Hypoglycemic Event  CBG: 61  Treatment: 4 oz juice/soda  Symptoms: None  Follow-up CBG: [id number] CBG Result:82  Possible Reasons for Event: Unknown  Comments/MD notified: Hypoglycemic protocol followed. Patient was asymptomatic. Marlowe Shores PA-C made aware. No new orders.    Jacquelynn Cree Ben Sanz

## 2020-01-18 NOTE — Telephone Encounter (Signed)
01/18/2020 Pts daughter called, asked to cxl appts due to current hospital admission. She will call back to r/s  srw

## 2020-01-18 NOTE — Progress Notes (Signed)
Orthopedic Tech Progress Note Patient Details:  Peter [name] 1930/03/24 [id number] Patient was working with THERAPY and I gave the PRAFO to them Ortho Devices Type of Ortho Device: Prafo boot/shoe Ortho Device/Splint Interventions: Other (comment)   Post Interventions Patient Tolerated: Well Instructions Provided: Care of device   Janit Pagan 01/18/2020, 2:41 PM

## 2020-01-18 NOTE — Progress Notes (Signed)
Physical Therapy Weekly Progress Note  Patient Details  Name: Peter [name] MRN: [id number] Date of Birth: Dec 02, 1930  Beginning of progress report period: January 11, 2020 End of progress report period: January 18, 2020  Today's Date: 01/18/2020 PT Individual Time: 1120-1200 and 1430-1530 PT Individual Time Calculation (min): 40 min and 60 min  Patient has met 0 of 3 short term goals.  Patient with decline in functional mobility due to new infarcts found on MRI leading to increased L sided weakness, L inattention, L side sensation, dysarthria, and decreased arousal. Patient currently with fluctuating mobility requires max-total A of 1-2 people-mod A for bed mobility and max-total A of 1-2 people for transfers, and max A +2 to stand and ambulate 25 feet with limited participation due to lethargy. Will continue to progress mobility as tolerated by the patient.   Patient continues to demonstrate the following deficits muscle weakness, decreased cardiorespiratoy endurance, abnormal tone, decreased coordination and decreased motor planning, decreased attention to left and decreased motor planning, decreased initiation, decreased attention, decreased awareness and delayed processing and decreased sitting balance, decreased standing balance, decreased postural control, hemiplegia and decreased balance strategies and therefore will continue to benefit from skilled PT intervention to increase functional independence with mobility.  Patient not progressing toward long term goals.  See goal revision..  Plan of care revisions: Downgraded goals to mod A overall due to functional decline, see Care Plan for details..  PT Short Term Goals Week 1:  PT Short Term Goal 1 (Week 1): Pt will ambulate 52f w/RW and mod assist PT Short Term Goal 1 - Progress (Week 1): Not progressing PT Short Term Goal 2 (Week 1): Pt will transfer bed to/from wc w/RW and mod assist PT Short Term Goal 2 - Progress (Week 1):  Progressing toward goal PT Short Term Goal 3 (Week 1): Pt will maintain static sitting balance on edge of bed w/min assist PT Short Term Goal 3 - Progress (Week 1): Progressing toward goal Week 2:  PT Short Term Goal 1 (Week 2): Patient will perform bed mobility with max A of 1 person consistently. PT Short Term Goal 2 (Week 2): Patient will perform basic transfers with max A of 1 person consistently. PT Short Term Goal 3 (Week 2): Patient will tolerate sitting in TIS w/c >2 hours per day. PT Short Term Goal 4 (Week 2): Patient will progress with ambulation >50 feet with max A +2.  Skilled Therapeutic Interventions/Progress Updates:     Session 1: Patient in bed asleep upon PT arrival. Patient difficult to arouse throughout session despite increased multisensory stimulation in lying and sitting throughout session. Opened his eyes to loud verbal cues 4-5 times during session and demonstrated only intermittent initiation with R side with mobility. Patient incontinent of bladder at beginning of session.   Therapeutic Activity: Bed Mobility: Patient performed rolling R/L x3 with max-total A, initiated reaching to the rail with R hand each time rolling L. Provided cues and facilitation for positioning/sequencing during rolling. He performed supine to/from sit with mod A of 1 and CGA of a second for safety, patient opened his eyes and initiated uisng his R hand to pull up using therapists shoulder to come to sitting. Provided verbal cues for sequencing and initiation throughout. Patient sat EOB >15 min working on arousal, trunk control, and volitional movement of extremities. Patient remained lethargic, only opened his eyes x3. Initially required mod A for sitting balance, quickly required max A +2 with slumped posture. Facilitated cervical and  thoracic extension in sitting without initiation from patient. Patient able to hold R hand in the air after therapist lifted it and provided cues, able to sustain  hold x5-6 sec before his hand drifted back down to the bed. Attempted on L without motor activation. Provided tapping and rubbing to L upper and lower extremities for increased attention and sensitization. Patient eventually snoring and no longer responding to cues and returned to lying in the bed. Performed scooting up in the bed with total A +2. Provided PROM to all extremities, noted decreased DF in L. Placed patient in L side-lying for pressure relief and improved feedback to L side with pillows placed for pressure relief and positioning.   Patient in bed at end of session with breaks locked, bed alarm set, and all needs within reach.   Session 2: Patient in bed alert and talking to CSW upon PT and OT arrival. Patient alert and agreeable to participating in therapy session. Patient denied pain during session. Participated in 15 min of co-treat with OT/PT then 60 additional min with PT.   Therapeutic Activity: PT retrieved TIS w/c as OT initiated bed mobility. Provided patient with 20"x18" TIS with hybrid Roho and foam cushion due to sacral skin breakdown.  Transfers: Patient sitting EOB with OT upon PT return. He performed sit to/from stand x1 with max a +2 using 3 Musketeer technique. Provided verbal cues for forward weight shift and hip/trunk and L knee extension in standing.  Gait Training:  Patient ambulated ~25 feet using 3 Musketeer technique with max A +2. Ambulated with increased head and trunk flexion, decreased R weight shift, decreased L limb advancement, able to progress independently ~50% of the time then required max A to progress forward. Provided verbal cues for sequencing, facilitated weight shifting and trunk extension throughout. Required assist from a third person to bring TIS w/c behind patient at end of ambulation trial.  Wheelchair Mobility:  Patient was transported in the TIS w/c with total A throughout session for energy conservation and time management.  Patient sat in  TIS w/c at the nurses station for increased sensory stimulation as PT adjusted patient's head rest and applied padding to his leg rests to maintain skin integrity and improve sitting tolerance. Provided constant verbal stimulation to maintain arousal with patient intermittently participating in conversation, limited by fatigue. Patient asked for something to drink. Provided Nectar thick water with total A and cues for performing a dry swallow between sips and stimulation to maintain arousal. Patient drank 25% of water. RN provided medication between sips. Patient tolerated sitting in the TIS w/c >30 min and reported feeling comfortable and "well supported." Patient agreeable to continue sitting in the TIS w/c at the nurses station at end of session.   Patient in TIS w/c at the nurses station for supervision at end of session with breaks locked and all needs within reach. Educated nursing on +2 assist with Stedy or Maxi Move for safe transfer back to bed based on level of arousal.    Therapy Documentation Precautions:  Precautions Precautions: Fall Precaution Comments: hx syncope, L lean, hx dementia Restrictions Weight Bearing Restrictions: No  Therapy/Group: Individual Therapy   L  PT, DPT  01/18/2020, 4:22 PM  

## 2020-01-19 ENCOUNTER — Inpatient Hospital Stay (HOSPITAL_COMMUNITY): Payer: Medicare Other

## 2020-01-19 DIAGNOSIS — I63511 Cerebral infarction due to unspecified occlusion or stenosis of right middle cerebral artery: Secondary | ICD-10-CM | POA: Diagnosis not present

## 2020-01-19 DIAGNOSIS — E119 Type 2 diabetes mellitus without complications: Secondary | ICD-10-CM | POA: Diagnosis not present

## 2020-01-19 DIAGNOSIS — R7989 Other specified abnormal findings of blood chemistry: Secondary | ICD-10-CM | POA: Diagnosis not present

## 2020-01-19 DIAGNOSIS — I10 Essential (primary) hypertension: Secondary | ICD-10-CM | POA: Diagnosis not present

## 2020-01-19 LAB — GLUCOSE, CAPILLARY
Glucose-Capillary: 110 mg/dL — ABNORMAL HIGH (ref 70–99)
Glucose-Capillary: 173 mg/dL — ABNORMAL HIGH (ref 70–99)
Glucose-Capillary: 128 mg/dL — ABNORMAL HIGH (ref 70–99)
Glucose-Capillary: 98 mg/dL (ref 70–99)

## 2020-01-19 NOTE — Progress Notes (Signed)
Occupational Therapy Weekly Progress Note  Patient Details  Name: Peter [name] MRN: [id number] Date of Birth: February 20, 1930  Beginning of progress report period: January 11, 2020 End of progress report period: January 19, 2020  Today's Date: 01/19/2020 OT Individual Time: 4196-2229 OT Individual Time Calculation (min): 70 min    Patient has met 0 of 4 short term goals.  Pt has had a change in medical status, with several new infarcts and episodes of unresponsiveness. This has led to a reduced LUE/LE functional use, L neglect, and some L visual deficits (pt endorsing dizziness). Despite this pt is motivated to participate in therapy and has already made some functional gains despite his new infarcts. He is requiring mod A for sitting balance, mod A for UB ADLs, max-total A LB ADLs.    Patient continues to demonstrate the following deficits: muscle weakness, decreased cardiorespiratoy endurance, decreased coordination and decreased motor planning, decreased midline orientation and decreased attention to left, decreased initiation, decreased attention, decreased awareness, decreased problem solving, decreased safety awareness, decreased memory and delayed processing and decreased sitting balance, decreased standing balance, decreased postural control, hemiplegia and decreased balance strategies and therefore will continue to benefit from skilled OT intervention to enhance overall performance with BADL and Reduce care partner burden.  Patient not progressing toward long term goals.  See goal revision..  Plan of care revisions: Several goals downgraded to min-mod A level.  OT Short Term Goals Week 1:  OT Short Term Goal 1 (Week 1): Pt will transfer to toilet/BSC with Max A of 1 OT Short Term Goal 1 - Progress (Week 1): Not met OT Short Term Goal 2 (Week 1): Pt will static stand with no more than Mod A during ADL OT Short Term Goal 2 - Progress (Week 1): Not met OT Short Term Goal 3 (Week 1): Pt  will perform UB/LB bathing with Mod A with AE PRN OT Short Term Goal 3 - Progress (Week 1): Not met OT Short Term Goal 4 (Week 1): Pt will perform sit to stand Mod A to decrease BOC OT Short Term Goal 4 - Progress (Week 1): Not met Week 2:  OT Short Term Goal 1 (Week 2): Pt will complete UB bathing EOB with mod A OT Short Term Goal 2 (Week 2): Pt will complete transfer to [facility] with max A +1 OT Short Term Goal 3 (Week 2): Pt will increase arousal and alertness for participation in 3 consecutive sessions  Skilled Therapeutic Interventions/Progress Updates:    Pt received supine with no c/o pain, agreeable to OT session. Pt completed bed mobility, rolling R and L with CGA for R and max A for L. Mod A for peri hygiene, with pt washing up anteriorly and max A for posterior. Pt completed bed mobility to EOB with max A for trunk elevation. Pt sat EOB with L lean, mod A. Squat pivot transfer to the TIS with max A +1, +2 present for safety. At the sink pt required frequent cueing for L lateral lean and to maintain midline orientation. Pt completed oral care with min A overall, less cueing needed for sequencing this session. Min HOH to wash UB with good integration of the LUE once cued to do so. Pt required mod A to don shirt seated. Spoke with pt's daughter [name] on the phone to discuss d/c planning and OT POC. Pt was brought to the nurses desk in the [place] and left sitting with their supervision for safety.   Therapy Documentation Precautions:  Precautions Precautions: Fall Precaution Comments: hx syncope, L lean, hx dementia Restrictions Weight Bearing Restrictions: No   Therapy/Group: Individual Therapy  Curtis Sites 01/19/2020, 6:30 AM

## 2020-01-19 NOTE — Progress Notes (Signed)
[facility] ([facility] Community Based Palliative Care       This patient is enrolled in our palliative care services in the community.  [facility] will continue to follow for any discharge planning needs and to coordinate continuation of palliative care.   If you have questions or need assistance, please call [phone] or contact the hospital Liaison listed on AMION.     Thank you for the opportunity to participate in this patient's care.     Chrislyn King, BSN, RN [facility] Hospital Liaison   [phone] 

## 2020-01-19 NOTE — Progress Notes (Signed)
Physical Therapy Session Note  Patient Details  Name: Peter [name] MRN: [id number] Date of Birth: 1930-12-09  Today's Date: 01/19/2020 PT Individual Time: 1415-1540 PT Individual Time Calculation (min): 85 min   Short Term Goals: Week 2:  PT Short Term Goal 1 (Week 2): Patient will perform bed mobility with max A of 1 person consistently. PT Short Term Goal 2 (Week 2): Patient will perform basic transfers with max A of 1 person consistently. PT Short Term Goal 3 (Week 2): Patient will tolerate sitting in TIS w/c >2 hours per day. PT Short Term Goal 4 (Week 2): Patient will progress with ambulation >50 feet with max A +2.  Skilled Therapeutic Interventions/Progress Updates:     Patient in bed asleep upon PT arrival. Patient aroused to verbal and tactile stimulation and agreeable to PT session. Patient denied pain during session. Patient's phone rang at beginning of session. PT assisted with answering the phone then patient took the phone with his L hand. Patient maintained a ~10 min conversation with a friend requiring min-mod cues for attention/arousal and holding the phone with his L hand to his ear throughout. Patient with dysarthric speech, provided cues for increased volume and enunciation during conversation. Patient in good spirits with a sense of humor during conversation. Was oriented to place and situation and provided this information appropriately when asked by his friend.   Therapeutic Activity: Bed Mobility: Patient performed rolling R/L with mod A to don pants with +2 assist. Provided cues for reaching with opposite upper extremity and pushing with opposite lower extremity with hand over hand assist for arm/leg placement. He performed supine to/from sit with min-mod A with increased time for initiation. Provided verbal cues for sequencing and using his L elbow and bed rail to push up to sitting. Transfers: Patient performed squat pivot bed>TIS w/c with max A of 1 person and  mod-min of a second. Provided cues for head-hips relationship and hand placement. He performed sit to/from stand x3 in the // bars with mod-max A of 1-2. Provided verbal cues for forward and R weight shift and trunk/hip/L knee extension, blocking L knee throughout for safety. He performed a toilet transfer to a bariatric BSC for increased trunk support in sitting using a Stedy with +2 assist progressing from max +2 to stand to max of 1. Patient was continent of bowl on BSC, required total A for peri-care and lower body clothing management. Patient transferred back the bed after using the [facility], as stated above.   Gait Training:  Patient ambulated 6 feet x2 using // bars with max A of 1 with a second person facilitating R trunk alignment to midline and w/c follow. Required max A for L limb advancement and B weight shifts, blocked L knee throughout due to increased flexion, but no true buckling noted. Provided verbal cues for midline orientation to correct L lean, L knee/hip/trunk extension in stance, initiation of L limb advancement, and sequencing.  Wheelchair Mobility:  Patient was transported in the w/c with total A throughout session for energy conservation and time management.  Neuromuscular Re-ed: Patient performed the following sitting and standing balance activities and L upper/lower extremity motor control activities: -sat EOB x4 min progressed from min A-CGA for sitting balance with reduced L lean this session, provided visual target on R to improve midline orientation and cues for erect posture in sitting with patient able to self-correct today -standing in // bars 3x1-2 min focused on midline orientation with R visual target to enhance  R weight shift and multimodal cues for L knee/hip/trunk/elbow extension for improved L side elongation in standing, progressed from max-min A and returning to max A with fatigue -L knee extension/flexion seated with visual target x10, able to perform AROM  through full range -L grasp and release x5 with increasing force with grasp around therapist's hand, reaching with L hand to visual target focused on full elbow extension x2 min  Patient in bed with R pressure relief boot and L PRAFO donned with SCDs in place per patient's request at end of session with breaks locked, bed alarm set, and all needs within reach. Turned on music on the computer per patient's request for increased sensory stimulation and patient's entertainment.    Therapy Documentation Precautions:  Precautions Precautions: Fall Precaution Comments: hx syncope, L lean, hx dementia Restrictions Weight Bearing Restrictions: No   Therapy/Group: Individual Therapy  Ninoshka Wainwright L [name] [name] PT, DPT  01/19/2020, 3:45 PM

## 2020-01-19 NOTE — Plan of Care (Signed)
Goals downgraded to reflect new functional status s/p several more infarcts and change in medical status since admission  Problem: RH Balance Goal: LTG: Patient will maintain dynamic sitting balance (OT) Description: LTG:  Patient will maintain dynamic sitting balance with assistance during activities of daily living (OT) Flowsheets (Taken 01/19/2020 [id number]) LTG: Pt will maintain dynamic sitting balance during ADLs with: (downgraded 12/8 d/t change in medical status and new infarcts limiting progress) Contact Guard/Touching assist Note: downgraded 12/8 d/t change in medical status and new infarcts limiting progress Goal: LTG Patient will maintain dynamic standing with ADLs (OT) Description: LTG:  Patient will maintain dynamic standing balance with assist during activities of daily living (OT)  Flowsheets (Taken 01/19/2020 1840) LTG: Pt will maintain dynamic standing balance during ADLs with: (downgraded 12/8 d/t change in medical status and new infarcts limiting progress) Moderate Assistance - Patient 50 - 74% Note: downgraded 12/8 d/t change in medical status and new infarcts limiting progress

## 2020-01-19 NOTE — Progress Notes (Signed)
[place] PHYSICAL MEDICINE & REHABILITATION PROGRESS NOTE   Subjective/Complaints: Says he slept somewhat last night. Asked why his tolerance, speech quality wax and wane. Had a better afternoon yesterday with therapy but still limited  ROS: Limited due to cognitive/behavioral .    Objective:   DG Chest Port 1 View  Result Date: 01/17/2020 CLINICAL DATA:  Cough EXAM: PORTABLE CHEST 1 VIEW COMPARISON:  01/09/2020 FINDINGS: Heart size upper limits of normal. Aortic atherosclerotic calcification. The right lung is clear. Question minimal atelectasis or scarring at the left base. Evidence of consolidation or lobar collapse. No visible effusion. No significant bone finding. Loop recorder in place. IMPRESSION: 1. Question minimal atelectasis or scarring at the left base. No consolidation or lobar collapse. 2. Aortic atherosclerotic calcification. Electronically Signed   By: Nelson Chimes M.D.   On: 01/17/2020 12:52   EEG adult  Result Date: 01/17/2020 Lora Havens, MD     01/17/2020  4:19 PM Patient Name: Peter [name] MRN: [id number] Epilepsy Attending: Lora Havens Referring Physician/Provider: Burnetta Sabin, NP Date: 01/17/2020 Duration: 25.07 mins Patient history: 86 y.o.malewith history of multiple syncopal episodes,presented to the emergency room for evaluation of sudden onset of slurred speech, difficulty swallowing and left-sided weakness. EEG to evaluate for seizure Level of alertness: Awake, asleep AEDs during EEG study: GBP Technical aspects: This EEG study was done with scalp electrodes positioned according to the 10-20 International system of electrode placement. Electrical activity was acquired at a sampling rate of 500Hz  and reviewed with a high frequency filter of 70Hz  and a low frequency filter of 1Hz . EEG data were recorded continuously and digitally stored. Description: The posterior dominant rhythm consists of 8-9 Hz activity of moderate voltage (25-35 uV) seen  predominantly in posterior head regions, symmetric and reactive to eye opening and eye closing. Sleep was characterized by vertex waves, sleep spindles (12 to 14 Hz), maximal frontocentral region. Hyperventilation and photic stimulation were not performed.   IMPRESSION: This study is within normal limits. No seizures or epileptiform discharges were seen throughout the recording. Lora Havens   Recent Labs    01/17/20 0626  WBC 12.4*  HGB 8.7*  HCT 26.2*  PLT 307   Recent Labs    01/17/20 0626  NA 140  K 3.9  CL 106  CO2 25  GLUCOSE 85  BUN 27*  CREATININE 1.33*  CALCIUM 8.2*    Intake/Output Summary (Last 24 hours) at 01/19/2020 2595 Last data filed at 01/19/2020 0300 Gross per 24 hour  Intake 560 ml  Output --  Net 560 ml        Physical Exam: Vital Signs Blood pressure (!) 160/68, pulse (!) 56, temperature 97.6 F (36.4 C), temperature source Oral, resp. rate 16, height 5\' 7"  (1.702 m), weight 73.1 kg, SpO2 98 %. Constitutional: No distress . Vital signs reviewed. HEENT: EOMI, oral membranes moist Neck: supple Cardiovascular: RRR without murmur. No JVD    Respiratory/Chest: CTA Bilaterally without wheezes or rales. Normal effort    GI/Abdomen: BS +, non-tender, non-distended Ext: no clubbing, cyanosis, or edema Psych: pleasant and cooperative Skin: Warm and dry.  MASD near sacrum. Necrotic right 2nd toe Psych: Normal mood.  Normal behavior. Musc: No edema in extremities.  No tenderness in extremities. Intrinsic muscle atrophy bilateral hands Neuro: more alert, oriented to person, place, reason, month Dysarthric, left central 7 ongoing Follows simple commands.  Motor: RUE/RLE: 4-4+ /5 proximal distal LUE: 3/5 proximal distal--stable LLE: 2+ to 3/5 proximal to distal  Assessment/Plan: 1. Functional deficits which require 3+ hours per day of interdisciplinary therapy in a comprehensive inpatient rehab setting.  Physiatrist is providing close team  supervision and 24 hour management of active medical problems listed below.  Physiatrist and rehab team continue to assess barriers to discharge/monitor patient progress toward functional and medical goals  Care Tool:  Bathing    Body parts bathed by patient: Right arm, Left arm, Chest, Abdomen, Front perineal area, Right upper leg, Left upper leg, Face   Body parts bathed by helper: Buttocks, Right lower leg, Left lower leg     Bathing assist Assist Level: Maximal Assistance - Patient 24 - 49%     Upper Body Dressing/Undressing Upper body dressing   What is the patient wearing?: Pull over shirt    Upper body assist Assist Level: Maximal Assistance - Patient 25 - 49% (sitting EOB)    Lower Body Dressing/Undressing Lower body dressing      What is the patient wearing?: Pants     Lower body assist Assist for lower body dressing: Maximal Assistance - Patient 25 - 49%     Toileting Toileting    Toileting assist Assist for toileting: 2 Helpers     Transfers Chair/bed transfer  Transfers assist     Chair/bed transfer assist level: 2 Helpers     Locomotion Ambulation   Ambulation assist      Assist level: 2 helpers Assistive device: Other (comment) (three musketeers) Max distance: 25 ft   Walk 10 feet activity   Assist     Assist level: 2 helpers Assistive device: Hand held assist   Walk 50 feet activity   Assist Walk 50 feet with 2 turns activity did not occur: Safety/medical concerns  Assist level: 2 helpers Assistive device: Hand held assist    Walk 150 feet activity   Assist Walk 150 feet activity did not occur: Safety/medical concerns         Walk 10 feet on uneven surface  activity   Assist Walk 10 feet on uneven surfaces activity did not occur: Safety/medical concerns         Wheelchair     Assist Will patient use wheelchair at discharge?: No             Wheelchair 50 feet with 2 turns  activity    Assist            Wheelchair 150 feet activity     Assist          Blood pressure (!) 160/68, pulse (!) 56, temperature 97.6 F (36.4 C), temperature source Oral, resp. rate 16, height 5\' 7"  (1.702 m), weight 73.1 kg, SpO2 98 %.    Medical Problem List and Plan: 1.  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 Involvement of right frontal lobe too.  Appreciate neurology recs.  -consider palliative care re-consult this week  -working toward 12/28 DC date  -spoke with daughter at length regarding date, new goals, potential needs moving forward.   -appreciate neurology help.  2.  Antithrombotics: -DVT/anticoagulation: SCDs             -antiplatelet therapy: Aspirin 81 mg daily and Brilinta 90 mg twice daily x3 months then Brilinta alone 3. Pain Management: Neurontin 300 mg twice daily, Cymbalta 30 mg daily.   -has pain  in right foot at night  - scheduled tylenol 4. Mood: Namenda 10 mg twice daily, Aricept 10 mg daily             -antipsychotic agents: N/A  -added LOW dose ritalin 2.5mg  for arousal/initiation starting 12/8 5. Neuropsych: This patient is capable of making decisions on his own behalf. 6. Skin/Wound Care: Routine skin checks  -kerlix wrap ordered for right foot to keep ischemic toe from catching  Continue pressure relief for coccyx ulcer 7. Fluids/Electrolytes/Nutrition: family/staff to encourage PO  -12/8 pt seems to have eaten better yesterday  -hold HS IVF for now  -discussed importance of PO intake with patient. He doesn't want PEG 8.  Dysphagia.    D2 nectars, advance diet as tolerated 9.  Hypertension.  Norvasc 10 mg daily, Coreg 6.25 mg twice daily, Cardura 4 mg daily, hydralazine 100 mg 3 times daily, HCTZ 12.5 mg daily, Imdur 60 mg daily.   Fair control 12/8. Allow  some elevation for perfusion 10.  History of prostate cancer.  Continue Proscar 5 mg daily. 11.  Diabetes mellitus with hyperglycemia.  Hemoglobin A1c 6.0.    lantus to 24u qam  12/8 CBG's reasonable today 12.  Diastolic congestive heart failure.  Monitor for any signs of fluid overload   Filed Weights   01/10/20 1448 01/14/20 0600 01/15/20 1005  Weight: 71.8 kg 72.2 kg 73.1 kg   Need weights every other day---will ask AGAIN  13.  History of gout.  Allopurinol 100 mg daily.  Monitor for any gout flareups 14.  Acute on chronic anemia with heme + stool.  Follow-up outpatient GI services for future endoscopy colonoscopy.  Continue Protonix 40 mg daily  Transfused 1 unit PRBC on 12/5  Hemoglobin 8.7 on 12/6  GI saw patient on acute and deferred endo/colonoscopy unless gross, significant bleeding  -recheck hgb Friday 15.  Hyperlipidemia.  Lipitor 16.  CAD with PCI.  Continue aspirin and Brilinta for now follow-up outpatient cardiology service.  No current plan for ischemic work-up 17.  CKD stage III.  Follow-up chemistries.  Creatinine 1.33 on 12/6  Hold IVF for now 18. Hypokalemia: K+ 3.9 on 12/6--> continue 3meq daily    LOS: 9 days A FACE TO FACE EVALUATION WAS PERFORMED  Meredith Staggers 01/19/2020, 8:21 AM

## 2020-01-20 ENCOUNTER — Inpatient Hospital Stay (HOSPITAL_COMMUNITY): Payer: Medicare Other

## 2020-01-20 ENCOUNTER — Other Ambulatory Visit: Payer: Self-pay | Admitting: *Deleted

## 2020-01-20 ENCOUNTER — Inpatient Hospital Stay (HOSPITAL_COMMUNITY): Payer: Medicare Other | Admitting: Speech Pathology

## 2020-01-20 DIAGNOSIS — R7989 Other specified abnormal findings of blood chemistry: Secondary | ICD-10-CM | POA: Diagnosis not present

## 2020-01-20 DIAGNOSIS — E119 Type 2 diabetes mellitus without complications: Secondary | ICD-10-CM | POA: Diagnosis not present

## 2020-01-20 DIAGNOSIS — I63511 Cerebral infarction due to unspecified occlusion or stenosis of right middle cerebral artery: Secondary | ICD-10-CM | POA: Diagnosis not present

## 2020-01-20 DIAGNOSIS — I10 Essential (primary) hypertension: Secondary | ICD-10-CM | POA: Diagnosis not present

## 2020-01-20 LAB — GLUCOSE, CAPILLARY
Glucose-Capillary: 77 mg/dL (ref 70–99)
Glucose-Capillary: 149 mg/dL — ABNORMAL HIGH (ref 70–99)
Glucose-Capillary: 142 mg/dL — ABNORMAL HIGH (ref 70–99)
Glucose-Capillary: 136 mg/dL — ABNORMAL HIGH (ref 70–99)

## 2020-01-20 MED ORDER — ACETAMINOPHEN 325 MG PO TABS
650.0000 mg | ORAL_TABLET | Freq: Four times a day (QID) | ORAL | Status: DC | PRN
  Administered 2020-01-21 – 2020-01-31 (×5): 650 mg via ORAL
  Filled 2020-01-20 (×7): qty 2

## 2020-01-20 MED ORDER — METHYLPHENIDATE HCL 5 MG PO TABS
5.0000 mg | ORAL_TABLET | Freq: Two times a day (BID) | ORAL | Status: DC
  Administered 2020-01-20 – 2020-01-27 (×14): 5 mg via ORAL
  Filled 2020-01-20 (×15): qty 1

## 2020-01-20 NOTE — Progress Notes (Signed)
[place] PHYSICAL MEDICINE & REHABILITATION PROGRESS NOTE   Subjective/Complaints: Slept until about 0530. Listening to jazz music when I came in. "when can I go back to visit [place]?"  ROS: Patient denies fever, rash, sore throat, blurred vision, nausea, vomiting, diarrhea, cough, shortness of breath or chest pain, joint or back pain, headache, or mood change.    Objective:   No results found. No results for input(s): WBC, HGB, HCT, PLT in the last 72 hours. No results for input(s): NA, K, CL, CO2, GLUCOSE, BUN, CREATININE, CALCIUM in the last 72 hours.  Intake/Output Summary (Last 24 hours) at 01/20/2020 1008 Last data filed at 01/20/2020 0800 Gross per 24 hour  Intake 120 ml  Output --  Net 120 ml        Physical Exam: Vital Signs Blood pressure (!) 151/67, pulse 65, temperature 98.6 F (37 C), temperature source Oral, resp. rate 16, height 5\' 7"  (1.702 m), weight 73.1 kg, SpO2 100 %. Constitutional: No distress . Vital signs reviewed. HEENT: EOMI, oral membranes moist Neck: supple Cardiovascular: RRR without murmur. No JVD    Respiratory/Chest: CTA Bilaterally without wheezes or rales. Normal effort    GI/Abdomen: BS +, non-tender, non-distended Ext: no clubbing, cyanosis, or edema Psych: pleasant and cooperative Skin: Warm and dry.  MASD near sacrum. Necrotic right 2nd toe--no changes Musc: No edema in extremities.  No tenderness in extremities. Intrinsic muscle atrophy bilateral hands Neuro: alert, speech clearer, phonating better, oriented to person, place, reason, month left central 7 ongoing Follows simple commands.  Motor: RUE/RLE: 4-4+ /5 proximal distal LUE: 3/5 proximal distal--stable LLE: 2+ to 3/5 proximal to distal--no changes today in motor score  Assessment/Plan: 1. Functional deficits which require 3+ hours per day of interdisciplinary therapy in a comprehensive inpatient rehab setting.  Physiatrist is providing close team supervision and 24 hour  management of active medical problems listed below.  Physiatrist and rehab team continue to assess barriers to discharge/monitor patient progress toward functional and medical goals  Care Tool:  Bathing    Body parts bathed by patient: Right arm,Left arm,Chest,Abdomen,Front perineal area,Right upper leg,Left upper leg,Face   Body parts bathed by helper: Buttocks,Right lower leg,Left lower leg     Bathing assist Assist Level: Maximal Assistance - Patient 24 - 49%     Upper Body Dressing/Undressing Upper body dressing   What is the patient wearing?: Pull over shirt    Upper body assist Assist Level: Maximal Assistance - Patient 25 - 49% (sitting EOB)    Lower Body Dressing/Undressing Lower body dressing      What is the patient wearing?: Pants     Lower body assist Assist for lower body dressing: Maximal Assistance - Patient 25 - 49%     Toileting Toileting    Toileting assist Assist for toileting: 2 Helpers     Transfers Chair/bed transfer  Transfers assist     Chair/bed transfer assist level: 2 Helpers     Locomotion Ambulation   Ambulation assist      Assist level: 2 helpers Assistive device: Parallel bars Max distance: 6 ft   Walk 10 feet activity   Assist     Assist level: 2 helpers Assistive device: Hand held assist   Walk 50 feet activity   Assist Walk 50 feet with 2 turns activity did not occur: Safety/medical concerns  Assist level: 2 helpers Assistive device: Hand held assist    Walk 150 feet activity   Assist Walk 150 feet activity did not  occur: Safety/medical concerns         Walk 10 feet on uneven surface  activity   Assist Walk 10 feet on uneven surfaces activity did not occur: Safety/medical concerns         Wheelchair     Assist Will patient use wheelchair at discharge?: No             Wheelchair 50 feet with 2 turns activity    Assist            Wheelchair 150 feet activity      Assist          Blood pressure (!) 151/67, pulse 65, temperature 98.6 F (37 C), temperature source Oral, resp. rate 16, height 5\' 7"  (1.702 m), weight 73.1 kg, SpO2 100 %.    Medical Problem List and Plan: 1.  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 Involvement of right frontal lobe too.  Appreciate neurology recs.  - palliative care left a note that they are following. Appreciate their support!  -working toward 12/28 DC date  -have spoken with daughter post-team conference regarding date, new goals, potential needs moving forward.   -appreciate neurology help.  2.  Antithrombotics: -DVT/anticoagulation: SCDs             -antiplatelet therapy: Aspirin 81 mg daily and Brilinta 90 mg twice daily x3 months then Brilinta alone 3. Pain Management: Neurontin 300 mg twice daily, Cymbalta 30 mg daily.   -has pain in right foot at night  - scheduled tylenol 4. Mood: Namenda 10 mg twice daily, Aricept 10 mg daily             -antipsychotic agents: N/A  -added LOW dose ritalin 2.5mg  for arousal/initiation starting 12/8  12/9 increase ritalin to 5mg  as he's tolerating and the 2.5 seems to be helping somewhat 5. Neuropsych: This patient is capable of making decisions on his own behalf. 6. Skin/Wound Care: Routine skin checks  -kerlix wrap  for right foot to keep ischemic toe from catching  Continue pressure relief for coccyx ulcer 7. Fluids/Electrolytes/Nutrition: family/staff to encourage PO  -12/9 ate a little better yesterday   -check bmet, prealbumin tomorrow 8.  Dysphagia.    D2 nectars, advance diet as tolerated 9.  Hypertension.  Norvasc 10 mg daily, Coreg 6.25 mg twice daily, Cardura 4 mg daily, hydralazine 100 mg 3 times daily, HCTZ 12.5 mg daily, Imdur 60 mg daily.   Fair control 12/9. Allow some  elevation for perfusion 10.  History of prostate cancer.  Continue Proscar 5 mg daily. 11.  Diabetes mellitus with hyperglycemia.  Hemoglobin A1c 6.0.    lantus to 24u qam  12/9 CBG's reasonable today 12.  Diastolic congestive heart failure.  Monitor for any signs of fluid overload   Filed Weights   01/10/20 1448 01/14/20 0600 01/15/20 1005  Weight: 71.8 kg 72.2 kg 73.1 kg   Need weights every other day---will ask AGAIN  13.  History of gout.  Allopurinol 100 mg daily.  Monitor for any gout flareups 14.  Acute on chronic anemia with heme + stool.  Follow-up outpatient GI services for future endoscopy colonoscopy.  Continue Protonix 40 mg daily  Transfused 1 unit PRBC on 12/5  Hemoglobin 8.7 on 12/6  GI saw patient on acute and deferred endo/colonoscopy unless gross,  significant bleeding  -recheck hgb Friday 15.  Hyperlipidemia.  Lipitor 16.  CAD with PCI.  Continue aspirin and Brilinta for now follow-up outpatient cardiology service.  No current plan for ischemic work-up 17.  CKD stage III.  Follow-up chemistries.  Creatinine 1.33 on 12/6  Holding IVF for now 18. Hypokalemia: K+ 3.9 on 12/6--> continue 40meq daily    LOS: 10 days A FACE TO FACE EVALUATION WAS PERFORMED  Meredith Staggers 01/20/2020, 10:08 AM

## 2020-01-20 NOTE — Progress Notes (Signed)
Physical Therapy Session Note  Patient Details  Name: Peter [name] MRN: [id number] Date of Birth: 06-16-1930  Today's Date: 01/20/2020 PT Individual Time: 1330-1415 PT Individual Time Calculation (min): 45 min   Short Term Goals: Week 2:  PT Short Term Goal 1 (Week 2): Patient will perform bed mobility with max A of 1 person consistently. PT Short Term Goal 2 (Week 2): Patient will perform basic transfers with max A of 1 person consistently. PT Short Term Goal 3 (Week 2): Patient will tolerate sitting in TIS w/c >2 hours per day. PT Short Term Goal 4 (Week 2): Patient will progress with ambulation >50 feet with max A +2.  Skilled Therapeutic Interventions/Progress Updates:     Patient in bed with NT performing suctioning upon PT arrival. Patient alert and agreeable to PT session. Patient reported mild/moderate back pain during session, RN made aware. PT provided repositioning, rest breaks, and distraction as pain interventions throughout session.   Therapeutic Activity: Bed Mobility: Patient performed supine to sit with mod-min A in a flat bed without use of bed rail. Provided verbal cues for rolling R, setting R elbow to push up to his elbow then his hand to come to sitting. Patient sat EOB >15 min with min A-close supervision for sitting balance, self fed himself 2 bites of magic cup, patient did not follow cues for small bites and had occurrence of mild aspiration after second bite. Encouraged strong cough and provided suction to the patient to clear airway and manage secretions following aspiration. Patient able to perform successful throat clear x3 after without coughing or choking. PT retrieved TIS w/c with patient sitting EOB with supervision for balance. Patient began leaning back and PT assisted his to lying horizontally on the bed with a pillow behind his head as w/c was retrieved from the bathroom.  Transfers: Patient performed Patient performed a squat pivot transfer bed>TIS w/c  with max A +2. Provided cues for hand placement and head-hips relationship for proper technique and decreased assist with transfers.  He performed sit to/from stand x1 in the // bars with max A +2 with decreased knee/hip/trunk extension today due to fatigue. Provided verbal cues and facilitation for scooting forward, forward weight shift and leg and trunk extension to come to standing.  Wheelchair Mobility:  Patient was transported in the w/c with total A throughout session for energy conservation and time management.  Informed RN and SLP of aspiration during session.  Patient in [place] w/c at the nurses station for full supervision for safety and increased environmental stimulation at end of session with breaks locked and all needs within reach.    Therapy Documentation Precautions:  Precautions Precautions: Fall Precaution Comments: hx syncope, L lean, hx dementia Restrictions Weight Bearing Restrictions: No   Therapy/Group: Individual Therapy  Jenevieve Kirschbaum L Arleny Kruger PT, DPT  01/20/2020, 5:06 PM

## 2020-01-20 NOTE — Progress Notes (Signed)
Speech Language Pathology Daily Session Note  Patient Details  Name: Peter [name] MRN: [id number] Date of Birth: 29-Mar-1930  Today's Date: 01/20/2020 SLP Individual Time: 1420-1505 SLP Individual Time Calculation (min): 45 min  Short Term Goals: Week 2: SLP Short Term Goal 1 (Week 2): Pt will demonstrate use of speech intelligibility skills at the phrase level with 90% accuarcy with min A verbal cues. SLP Short Term Goal 2 (Week 2): Pt will recall daily and novel events with min A verbal cues for use of external aids. SLP Short Term Goal 3 (Week 2): Pt will demonstrate sustained attention to functional tasks for 15 minutes with Min verbal cues for redirection. SLP Short Term Goal 4 (Week 2): Patient will consume current diet with minimal overt s/s of aspiration and Min verbal cues for use of swallowing compensatory strategies. SLP Short Term Goal 5 (Week 2): Patient will consume trials of thin liquids with minimal overt s/s of aspiration over 2 sessions to assess readiness for repeat MBS. SLP Short Term Goal 6 (Week 2): Patient will demonstrate effficient mastication and complete oral clerance of Dys. 3 textures  without overt s/s of aspiration over 2 sessions with Min verbal cues prior to upgrade.  Skilled Therapeutic Interventions:   Patient seen with wife and wife's home health aid present. Patient participated in skilled ST session focusing on speech-language cognitive and swallow function. After SLP setup, patient performed oral care with electric toothbrush. He consumed cup sips of thin liquids (water) with anterior spillage (minimal) on left and immediate cough response approximately 50% of sips. Patient's voice returned to baseline after mild amount of coughing and throat clearing. Patient was able to achieve clear speech at phrase level when slowing down, but he required modA cues for this. He was quite verbose and in conversational speech, intelligibility was decreased significantly.  Patient demonstrated good orientation to month, year and was able to recall one of his therapist's names. SLP discussed with wife and aide that plan is for patient to have repeat MBS likely beginning of week.   Pain Pain Assessment Pain Scale: 0-10 Pain Score: 0-No pain  Therapy/Group: Individual Therapy   Sonia Baller, MA, CCC-SLP Speech Therapy

## 2020-01-20 NOTE — Progress Notes (Signed)
Occupational Therapy Session Note  Patient Details  Name: Peter [name] MRN: [id number] Date of Birth: [birth year]  Today's Date: 01/20/2020 OT Individual Time: 1000-1042 OT Individual Time Calculation (min): 42 min    Short Term Goals: Week 1:  OT Short Term Goal 1 (Week 1): Pt will transfer to toilet/BSC with Max A of 1 OT Short Term Goal 1 - Progress (Week 1): Not met OT Short Term Goal 2 (Week 1): Pt will static stand with no more than Mod A during ADL OT Short Term Goal 2 - Progress (Week 1): Not met OT Short Term Goal 3 (Week 1): Pt will perform UB/LB bathing with Mod A with AE PRN OT Short Term Goal 3 - Progress (Week 1): Not met OT Short Term Goal 4 (Week 1): Pt will perform sit to stand Mod A to decrease BOC OT Short Term Goal 4 - Progress (Week 1): Not met  Skilled Therapeutic Interventions/Progress Updates:    1;1. Pt received in bed agreeable to OT alert and discusing music playing from computer. Vitals assessed prior to mobility BP 151/51, O2 99% and HR 68 in supine, HOB elevated. Pants donned total A at supine level with Pt able to bridge hips at bed level with A to stabilize L leg and OT advance pants past hips. Pt hooklying>sitting EOB with MAX A for trunk and LLE management. Pt initially requiring total A for sitting balance but after stabilizing on R elbow and placing wedge pillow under L hip pt able to hold midline with S-MIN A overall. Pt doffs shirt with MAX A and dons with MAX A and VC for hemi dressing technique. Pt L inattention and decreased continuation/sequencing impacting dressing performance. All clothing items placed far R on bed for R weight shift to encourage midline/decrease L lean. Pt requires VC for looking ahead and not into lap. Tissues placed on L for L attention when pt wipes mouth d/t decreased management of oral secretions. Exited session with pt seated in bed, exit alarm on and call light in reach   Therapy Documentation Precautions:   Precautions Precautions: Fall Precaution Comments: hx syncope, L lean, hx dementia Restrictions Weight Bearing Restrictions: No General:   Vital Signs: Therapy Vitals Temp: 98.6 F (37 C) Temp Source: Oral Pulse Rate: 65 Resp: 16 BP: (!) 155/51 Patient Position (if appropriate): Lying Oxygen Therapy SpO2: 100 % O2 Device: Room Air Pain:   ADL: ADL Upper Body Dressing: Maximal assistance Lower Body Dressing: Dependent Where Assessed-Lower Body Dressing: Edge of bed Toileting: Dependent Where Assessed-Toileting: Bed level Vision   Perception    Praxis   Exercises:   Other Treatments:     Therapy/Group: Individual Therapy  Tonny Branch 01/20/2020, 6:57 AM

## 2020-01-21 ENCOUNTER — Inpatient Hospital Stay (HOSPITAL_COMMUNITY): Payer: Medicare Other | Admitting: Speech Pathology

## 2020-01-21 ENCOUNTER — Inpatient Hospital Stay (HOSPITAL_COMMUNITY): Payer: Medicare Other | Admitting: Occupational Therapy

## 2020-01-21 ENCOUNTER — Inpatient Hospital Stay (HOSPITAL_COMMUNITY): Payer: Medicare Other

## 2020-01-21 LAB — BASIC METABOLIC PANEL
Anion gap: 12 (ref 5–15)
BUN: 33 mg/dL — ABNORMAL HIGH (ref 8–23)
CO2: 21 mmol/L — ABNORMAL LOW (ref 22–32)
Calcium: 8.4 mg/dL — ABNORMAL LOW (ref 8.9–10.3)
Chloride: 107 mmol/L (ref 98–111)
Creatinine, Ser: 1.42 mg/dL — ABNORMAL HIGH (ref 0.61–1.24)
GFR, Estimated: 48 mL/min — ABNORMAL LOW (ref >60)
Glucose, Bld: 88 mg/dL (ref 70–99)
Potassium: 3.9 mmol/L (ref 3.5–5.1)
Sodium: 140 mmol/L (ref 135–145)

## 2020-01-21 LAB — CBC
HCT: 24.9 % — ABNORMAL LOW (ref 39.0–52.0)
Hemoglobin: 8.3 g/dL — ABNORMAL LOW (ref 13.0–17.0)
MCH: 27.6 pg (ref 26.0–34.0)
MCHC: 33.3 g/dL (ref 30.0–36.0)
MCV: 82.7 fL (ref 80.0–100.0)
Platelets: 326 10*3/uL (ref 150–400)
RBC: 3.01 MIL/uL — ABNORMAL LOW (ref 4.22–5.81)
RDW: 16 % — ABNORMAL HIGH (ref 11.5–15.5)
WBC: 8.4 10*3/uL (ref 4.0–10.5)
nRBC: 0 % (ref 0.0–0.2)

## 2020-01-21 LAB — GLUCOSE, CAPILLARY
Glucose-Capillary: 75 mg/dL (ref 70–99)
Glucose-Capillary: 119 mg/dL — ABNORMAL HIGH (ref 70–99)
Glucose-Capillary: 114 mg/dL — ABNORMAL HIGH (ref 70–99)
Glucose-Capillary: 171 mg/dL — ABNORMAL HIGH (ref 70–99)

## 2020-01-21 LAB — PREALBUMIN: Prealbumin: 18.9 mg/dL (ref 18–38)

## 2020-01-21 MED ORDER — MIRTAZAPINE 15 MG PO TABS
7.5000 mg | ORAL_TABLET | Freq: Every day | ORAL | Status: DC
  Administered 2020-01-21 – 2020-02-03 (×13): 7.5 mg via ORAL
  Filled 2020-01-21 (×13): qty 1

## 2020-01-21 MED ORDER — SODIUM CHLORIDE 0.45 % IV SOLN: INTRAVENOUS | Status: DC

## 2020-01-21 NOTE — Progress Notes (Signed)
[place] PHYSICAL MEDICINE & REHABILITATION PROGRESS NOTE   Subjective/Complaints: Didn't sleep as well last night. Says his back was bothering him. Also felt a little cold  ROS: Patient denies fever, rash, sore throat, blurred vision, nausea, vomiting, diarrhea, cough, shortness of breath or chest pain,  or mood change.     Objective:   No results found. Recent Labs    01/21/20 0410  WBC 8.4  HGB 8.3*  HCT 24.9*  PLT 326   Recent Labs    01/21/20 0410  NA 140  K 3.9  CL 107  CO2 21*  GLUCOSE 88  BUN 33*  CREATININE 1.42*  CALCIUM 8.4*    Intake/Output Summary (Last 24 hours) at 01/21/2020 1006 Last data filed at 01/21/2020 0752 Gross per 24 hour  Intake 340 ml  Output --  Net 340 ml        Physical Exam: Vital Signs Blood pressure (!) 145/48, pulse 62, temperature 97.9 F (36.6 C), resp. rate 18, height 5\' 7"  (1.702 m), weight 69.2 kg, SpO2 99 %. Constitutional: No distress . Vital signs reviewed. HEENT: EOMI, oral membranes moist Neck: supple Cardiovascular: RRR without murmur. No JVD    Respiratory/Chest: CTA Bilaterally without wheezes or rales. Normal effort    GI/Abdomen: BS +, non-tender, non-distended Ext: no clubbing, cyanosis, or edema Psych: pleasant and cooperative Skin: Warm and dry. ?mild redness at sacrum. Necrotic right 2nd toe--no changes Musc: No edema in extremities.  No tenderness in extremities. Intrinsic muscle atrophy bilateral hands Neuro: appears a little more groggy today. Speech dysarthric, weaker. oriented to person, place, reason, month left central 7 ongoing Follows simple commands.  Motor: RUE/RLE: 4-4+ /5 proximal distal LUE: 3/5 proximal distal LLE: 2+ to 3/5 proximal to distal--motor exam unchanged  Assessment/Plan: 1. Functional deficits which require 3+ hours per day of interdisciplinary therapy in a comprehensive inpatient rehab setting.  Physiatrist is providing close team supervision and 24 hour management of  active medical problems listed below.  Physiatrist and rehab team continue to assess barriers to discharge/monitor patient progress toward functional and medical goals  Care Tool:  Bathing    Body parts bathed by patient: Right arm,Left arm,Chest,Abdomen,Front perineal area,Right upper leg,Left upper leg,Face   Body parts bathed by helper: Buttocks,Right lower leg,Left lower leg     Bathing assist Assist Level: Maximal Assistance - Patient 24 - 49%     Upper Body Dressing/Undressing Upper body dressing   What is the patient wearing?: Pull over shirt    Upper body assist Assist Level: Maximal Assistance - Patient 25 - 49% (sitting EOB)    Lower Body Dressing/Undressing Lower body dressing      What is the patient wearing?: Pants     Lower body assist Assist for lower body dressing: Maximal Assistance - Patient 25 - 49%     Toileting Toileting    Toileting assist Assist for toileting: Dependent - Patient 0% (3 helpers)     Transfers Chair/bed transfer  Transfers assist     Chair/bed transfer assist level: 2 Helpers     Locomotion Ambulation   Ambulation assist      Assist level: 2 helpers Assistive device: Parallel bars Max distance: 6 ft   Walk 10 feet activity   Assist     Assist level: 2 helpers Assistive device: Hand held assist   Walk 50 feet activity   Assist Walk 50 feet with 2 turns activity did not occur: Safety/medical concerns  Assist level: 2 helpers Assistive  device: Hand held assist    Walk 150 feet activity   Assist Walk 150 feet activity did not occur: Safety/medical concerns         Walk 10 feet on uneven surface  activity   Assist Walk 10 feet on uneven surfaces activity did not occur: Safety/medical concerns         Wheelchair     Assist Will patient use wheelchair at discharge?: No             Wheelchair 50 feet with 2 turns activity    Assist            Wheelchair 150 feet  activity     Assist          Blood pressure (!) 145/48, pulse 62, temperature 97.9 F (36.6 C), resp. rate 18, height 5\' 7"  (1.702 m), weight 69.2 kg, SpO2 99 %.    Medical Problem List and Plan: 1.  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 Involvement of right frontal lobe too.  Appreciate neurology recs.  - palliative care left a note that they are following. Appreciate their support!  -working toward 12/28 DC date  -have spoken with daughter this week after team conference regarding date, new goals, potential needs moving forward.   -appreciate neurology help.  2.  Antithrombotics: -DVT/anticoagulation: SCDs             -antiplatelet therapy: Aspirin 81 mg daily and Brilinta 90 mg twice daily x3 months then Brilinta alone 3. Pain Management: Neurontin 300 mg twice daily, Cymbalta 30 mg daily.   -has pain in right foot at night which is ongoing  - scheduled tylenol 4. Mood: Namenda 10 mg twice daily, Aricept 10 mg daily             -antipsychotic agents: N/A  -added LOW dose ritalin 2.5mg  for arousal/initiation starting 12/8  12/9 increase ritalin to 5mg  as he's tolerating and the 2.5 seems to be helping somewhat---->observe 12/10 5. Neuropsych: This patient is capable of making decisions on his own behalf. 6. Skin/Wound Care: Routine skin checks  -kerlix wrap  for right foot to keep ischemic toe from catching  Continue pressure relief for coccyx ulcer 7. Fluids/Electrolytes/Nutrition: family/staff to encourage PO  -12/10 after a better Wednesday, ate less than 25% Thursday   -prealbumin only 18.9   -BUN/Cr sl up---> resume HS IVF   -continue K+ supplement (3.9)   -add low dose remeron for appetite   -pt does not want NGT 8.  Dysphagia.    D2 nectars, advance diet as tolerated 9.  Hypertension.   Norvasc 10 mg daily, Coreg 6.25 mg twice daily, Cardura 4 mg daily, hydralazine 100 mg 3 times daily, HCTZ 12.5 mg daily, Imdur 60 mg daily.   Fair control 12/10. Allow some elevation for perfusion 10.  History of prostate cancer.  Continue Proscar 5 mg daily. 11.  Diabetes mellitus with hyperglycemia.  Hemoglobin A1c 6.0.    lantus to 24u qam  12/10 CBG's reasonable today 12.  Diastolic congestive heart failure.  Monitor for any signs of fluid overload   Filed Weights   01/14/20 0600 01/15/20 1005 01/21/20 0300  Weight: 72.2 kg 73.1 kg 69.2 kg   Need weights every other day---weight down today  13.  History of gout.  Allopurinol 100  mg daily.  Monitor for any gout flareups 14.  Acute on chronic anemia with heme + stool.  Follow-up outpatient GI services for future endoscopy colonoscopy.  Continue Protonix 40 mg daily  Transfused 1 unit PRBC on 12/5  Hemoglobin 8.7 on 12/6  GI saw patient on acute and deferred endo/colonoscopy unless gross, significant bleeding  12/10 Hgb sl down again to 8.3---transfuse if <7.5   -no gross blood in stool 15.  Hyperlipidemia.  Lipitor 16.  CAD with PCI.  Continue aspirin and Brilinta for now follow-up outpatient cardiology service.  No current plan for ischemic work-up 17.  CKD stage III.  Follow-up chemistries.  Creatinine 1.42 12/10  12/10 resuming fluids as above    LOS: 11 days A FACE TO [place] 01/21/2020, 10:06 AM

## 2020-01-21 NOTE — Progress Notes (Signed)
Occupational Therapy Session Note  Patient Details  Name: Peter [name] MRN: [id number] Date of Birth: Jan 10, 1931  Today's Date: 01/21/2020 OT Individual Time: 5300-5110 OT Individual Time Calculation (min): 57 min   Skilled Therapeutic Interventions/Progress Updates:    Pt greeted in bed, declining breakfast or participating in bathing/dressing tasks. Agreeable to attempt toileting per timed toileting schedule. Therefore therapeutic focus was placed on functional transfers, sit<stands, midline orientation, Lt attention, Lt hand use and ADL retraining. Max A for bed mobility with step by step cues provided and increased time for initiation. Max A for sit<stand in bariatric Stedy from elevated bed with 2nd helper present for safety. Pt did well with maintaining grip on the Stedy bar with his Lt hand given cues during transfer and he also did well with maintaining neutral midline though exhibited mild Lt lean. He held onto the [facility] bar with both hands while attempting to void on BSC. Pt conversing with therapist on his Lt side, turning head to speak to her. Given several minutes, pt unable to have continent void. Due to B+B incontinence in brief, hygiene was completed before donning a clean brief. Pt exhibited a very strong Lt lean at this time, needing max facilitation for neutral midline and Max A to maintain his Lt hand grip on the Stedy bar. Pt still conversing at this time, denied dizziness, just felt "tired." +3 assist ultimately needed for sit<stand from bariatric BSC. Hygiene completed with +2 for facilitating upright position, +1 for hygiene and brief change. Pt was then returned to bed where BP assessed, reading 158/50. Pt requested to engage in oral care. With HOB elevated, he required Max A and step by step cues to execute motor demands of task with Max A to use the Lt hand functionally. Pt was thoroughly suctioned afterwards with clear oral cavity. Pt remained comfortably in bed with all  needs within reach, bed alarm set, and HOB >30 degrees.   Therapy Documentation Precautions:  Precautions Precautions: Fall Precaution Comments: hx syncope, L lean, hx dementia Restrictions Weight Bearing Restrictions: No Pain: in back and the Rt foot. Notified RN of pts request for pain medicine during session   ADL: ADL Upper Body Dressing: Maximal assistance Lower Body Dressing: Dependent Where Assessed-Lower Body Dressing: Edge of bed Toileting: Dependent Where Assessed-Toileting: Bed level      Therapy/Group: Individual Therapy  Hermann Dottavio A Schae Cando 01/21/2020, 12:01 PM

## 2020-01-21 NOTE — Progress Notes (Signed)
Speech Language Pathology Daily Session Note  Patient Details  Name: Peter [name] MRN: [id number] Date of Birth: 17-Dec-1930  Today's Date: 01/21/2020 SLP Individual Time: 1345-1430 SLP Individual Time Calculation (min): 45 min  Short Term Goals: Week 2: SLP Short Term Goal 1 (Week 2): Pt will demonstrate use of speech intelligibility skills at the phrase level with 90% accuarcy with min A verbal cues. SLP Short Term Goal 2 (Week 2): Pt will recall daily and novel events with min A verbal cues for use of external aids. SLP Short Term Goal 3 (Week 2): Pt will demonstrate sustained attention to functional tasks for 15 minutes with Min verbal cues for redirection. SLP Short Term Goal 4 (Week 2): Patient will consume current diet with minimal overt s/s of aspiration and Min verbal cues for use of swallowing compensatory strategies. SLP Short Term Goal 5 (Week 2): Patient will consume trials of thin liquids with minimal overt s/s of aspiration over 2 sessions to assess readiness for repeat MBS. SLP Short Term Goal 6 (Week 2): Patient will demonstrate effficient mastication and complete oral clerance of Dys. 3 textures  without overt s/s of aspiration over 2 sessions with Min verbal cues prior to upgrade.  Skilled Therapeutic Interventions:   Patient seen for skilled ST session focusing on swallow and speech goals. Patient consumed 3 sips of thin liquids (water) with cough response on second sip and patient stating he thinks he took too big a sip. Patient's voice clear after brief period of coughing and throat clearing. Patient demonstrated some awareness to his speech and that he was hard to understand, but continues to require verbal cues to slow down and use speech strategies. In addition, patient has difficulty with attention, starts to become tangential and is very verbose, requiring SLP to interrupt in order to redirect, change topic, etc. Patient did not recall anything he and this SLP  discussed previous visit until SLP provided context cues, after which patient was able to recall one fact (color of SLP's trumpet) but did not demonstrate recall of anything else. Patient continues to benefit from skilled SLP intervention to maximize cognitive-linguistic, speech and swallow function prior to discharge.  Pain  No/denies pain  Therapy/Group: Individual Therapy  Sonia Baller, MA, CCC-SLP Speech Therapy

## 2020-01-21 NOTE — Progress Notes (Signed)
Physical Therapy Session Note  Patient Details  Name: Peter [name] MRN: [id number] Date of Birth: [birth year]  Today's Date: 01/21/2020 PT Individual Time: 0930-1015 PT Individual Time Calculation (min): 45 min   Short Term Goals: Week 1:  PT Short Term Goal 1 (Week 1): Pt will ambulate 24ft w/RW and mod assist PT Short Term Goal 1 - Progress (Week 1): Not progressing PT Short Term Goal 2 (Week 1): Pt will transfer bed to/from wc w/RW and mod assist PT Short Term Goal 2 - Progress (Week 1): Progressing toward goal PT Short Term Goal 3 (Week 1): Pt will maintain static sitting balance on edge of bed w/min assist PT Short Term Goal 3 - Progress (Week 1): Progressing toward goal Week 2:  PT Short Term Goal 1 (Week 2): Patient will perform bed mobility with max A of 1 person consistently. PT Short Term Goal 2 (Week 2): Patient will perform basic transfers with max A of 1 person consistently. PT Short Term Goal 3 (Week 2): Patient will tolerate sitting in TIS w/c >2 hours per day. PT Short Term Goal 4 (Week 2): Patient will progress with ambulation >50 feet with max A +2. Week 3:     Skilled Therapeutic Interventions/Progress Updates:    PAIN pt c/o back pain.  Performed trunk mobility and transferred pt oob to wc for improved comfort   Pt seen this am for session w/focus on functional mobility, sitting balance, simple dual UE task.  Session limited by pt inability to remain awake >45 min for treatment.  Pt initially supine, awake, somewhat alert.  Co back pain and agreeable to getting OOB to wc.  Pt stated he needed to urinate.  Max assist and max cues for positioning in urinal, pt unable to void.   Pt able to lift RLE for therapist to thread pants, max assist for L/attempts to lift w/tactile/verbal cues. Pt rolls to R w/max assist, tactile cues to facilitate initiation/sequencing, dependent for raising pants Rolls L w/mod assist, tactile cues as above. Attempted Sit to stand at  bedside but unable w/max +2 Squat pivot bed to wc total assist of 2, pt tends to push away from transfer direction w/RUE Pt repositioned for comfort/safety and transported to gym Pt became increasingly fatigued/frequent stim to remain awake for following tasks  Seated pt cued to place large pegs in peg board w/max cues and hand over hand facilitation of RUE/LUE positioned for wbing during task.  Pt complets x 8 pegs but falls asleep mult times during task, cannot follow pattern but places randomly.  Attempted Sit to stand to stedy but pt unable w/max assist of 2, not able to follow commands to assist, pushes away from stedy w/RUE rather than attempting to pull up.   Pt then so fatigued therapist was not able to sufficiently arouse to continue w/session. Pt left oob in wc, tilted for safety w/alarm belt set and needs in reach   Therapy Documentation Precautions:  Precautions Precautions: Fall Precaution Comments: hx syncope, L lean, hx dementia Restrictions Weight Bearing Restrictions: No   Therapy/Group: Individual Therapy  Callie Fielding, [place] 01/21/2020, 1:00 PM

## 2020-01-22 ENCOUNTER — Other Ambulatory Visit: Payer: Self-pay | Admitting: Internal Medicine

## 2020-01-22 ENCOUNTER — Inpatient Hospital Stay (HOSPITAL_COMMUNITY): Payer: Medicare Other

## 2020-01-22 ENCOUNTER — Inpatient Hospital Stay (HOSPITAL_COMMUNITY): Payer: Medicare Other | Admitting: Physical Therapy

## 2020-01-22 LAB — GLUCOSE, CAPILLARY
Glucose-Capillary: 77 mg/dL (ref 70–99)
Glucose-Capillary: 75 mg/dL (ref 70–99)
Glucose-Capillary: 125 mg/dL — ABNORMAL HIGH (ref 70–99)
Glucose-Capillary: 38 mg/dL — CL (ref 70–99)
Glucose-Capillary: 140 mg/dL — ABNORMAL HIGH (ref 70–99)

## 2020-01-22 MED ORDER — DEXTROSE 50 % IV SOLN
INTRAVENOUS | Status: AC
  Administered 2020-01-22: 25 g via INTRAVENOUS
  Filled 2020-01-22: qty 50

## 2020-01-22 MED ORDER — DEXTROSE 50 % IV SOLN: 25.0000 g | INTRAVENOUS | Status: AC

## 2020-01-22 NOTE — Progress Notes (Signed)
[place] PHYSICAL MEDICINE & REHABILITATION PROGRESS NOTE   Subjective/Complaints: Tolerated therapy well today Soiled bowel and bladder.   ROS: Patient denies fever, rash, sore throat, blurred vision, nausea, vomiting, diarrhea, cough, shortness of breath or chest pain,  or mood change.     Objective:   No results found. Recent Labs    01/21/20 0410  WBC 8.4  HGB 8.3*  HCT 24.9*  PLT 326   Recent Labs    01/21/20 0410  NA 140  K 3.9  CL 107  CO2 21*  GLUCOSE 88  BUN 33*  CREATININE 1.42*  CALCIUM 8.4*    Intake/Output Summary (Last 24 hours) at 01/22/2020 1315 Last data filed at 01/21/2020 1900 Gross per 24 hour  Intake 88 ml  Output --  Net 88 ml        Physical Exam: Vital Signs Blood pressure (!) 158/44, pulse 61, temperature 98.8 F (37.1 C), temperature source Oral, resp. rate 17, height 5\' 7"  (1.702 m), weight 69.2 kg, SpO2 92 %. Gen: no distress, normal appearing HEENT: oral mucosa pink and moist, NCAT Cardio: Reg rate Chest: normal effort, normal rate of breathing Abd: soft, non-distended Ext: no edema Skin: Warm and dry. ?mild redness at sacrum. Necrotic right 2nd toe--no changes Musc: No edema in extremities.  No tenderness in extremities. Intrinsic muscle atrophy bilateral hands Neuro: appears a little more groggy today. Speech dysarthric, weaker. oriented to person, place, reason, month left central 7 ongoing Follows simple commands.  Motor: RUE/RLE: 4-4+ /5 proximal distal LUE: 3/5 proximal distal LLE: 2+ to 3/5 proximal to distal--motor exam unchanged  Assessment/Plan: 1. Functional deficits which require 3+ hours per day of interdisciplinary therapy in a comprehensive inpatient rehab setting.  Physiatrist is providing close team supervision and 24 hour management of active medical problems listed below.  Physiatrist and rehab team continue to assess barriers to discharge/monitor patient progress toward functional and medical  goals  Care Tool:  Bathing    Body parts bathed by patient: Right arm,Left arm,Chest,Abdomen,Front perineal area,Right upper leg,Left upper leg,Face   Body parts bathed by helper: Buttocks,Right lower leg,Left lower leg     Bathing assist Assist Level: Maximal Assistance - Patient 24 - 49%     Upper Body Dressing/Undressing Upper body dressing   What is the patient wearing?: Pull over shirt    Upper body assist Assist Level: Maximal Assistance - Patient 25 - 49% (sitting EOB)    Lower Body Dressing/Undressing Lower body dressing      What is the patient wearing?: Pants     Lower body assist Assist for lower body dressing: Maximal Assistance - Patient 25 - 49%     Toileting Toileting    Toileting assist Assist for toileting: Dependent - Patient 0% (3 helpers)     Transfers Chair/bed transfer  Transfers assist     Chair/bed transfer assist level: 2 Helpers     Locomotion Ambulation   Ambulation assist      Assist level: 2 helpers Assistive device: Parallel bars Max distance: 6 ft   Walk 10 feet activity   Assist     Assist level: 2 helpers Assistive device: Hand held assist   Walk 50 feet activity   Assist Walk 50 feet with 2 turns activity did not occur: Safety/medical concerns  Assist level: 2 helpers Assistive device: Hand held assist    Walk 150 feet activity   Assist Walk 150 feet activity did not occur: Safety/medical concerns  Walk 10 feet on uneven surface  activity   Assist Walk 10 feet on uneven surfaces activity did not occur: Safety/medical concerns         Wheelchair     Assist Will patient use wheelchair at discharge?: No             Wheelchair 50 feet with 2 turns activity    Assist            Wheelchair 150 feet activity     Assist          Blood pressure (!) 158/44, pulse 61, temperature 98.8 F (37.1 C), temperature source Oral, resp. rate 17, height 5\' 7"  (1.702  m), weight 69.2 kg, SpO2 92 %.    Medical Problem List and Plan: 1.  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 Involvement of right frontal lobe too.  Appreciate neurology recs.  - palliative care left a note that they are following. Appreciate their support!  -working toward 12/28 DC date  -have spoken with daughter this week after team conference regarding date, new goals, potential needs moving forward.   -appreciate neurology help.   -Continue CIR 2.  Antithrombotics: -DVT/anticoagulation: SCDs             -antiplatelet therapy: Aspirin 81 mg daily and Brilinta 90 mg twice daily x3 months then Brilinta alone 3. Pain Management: Neurontin 300 mg twice daily, Cymbalta 30 mg daily.   -has pain in right foot at night which is ongoing  - scheduled tylenol 4. Mood: Namenda 10 mg twice daily, Aricept 10 mg daily             -antipsychotic agents: N/A  -added LOW dose ritalin 2.5mg  for arousal/initiation starting 12/8  12/9 increase ritalin to 5mg  as he's tolerating and the 2.5 seems to be helping somewhat---->observe 12/10 5. Neuropsych: This patient is capable of making decisions on his own behalf. 6. Skin/Wound Care: Routine skin checks  -kerlix wrap  for right foot to keep ischemic toe from catching  Continue pressure relief for coccyx ulcer 7. Fluids/Electrolytes/Nutrition: family/staff to encourage PO  -12/10 after a better Wednesday, ate less than 25% Thursday   -prealbumin only 18.9   -BUN/Cr sl up---> resume HS IVF   -continue K+ supplement (3.9)   -add low dose remeron for appetite   -pt does not want NGT 8.  Dysphagia.    D2 nectars, advance diet as tolerated 9.  Hypertension.  Norvasc 10 mg daily, Coreg 6.25 mg twice daily, Cardura 4 mg daily, hydralazine 100 mg 3 times daily, HCTZ 12.5 mg daily, Imdur 60  mg daily.   Fair control 12/11. Allow some elevation for perfusion 10.  History of prostate cancer.  Continue Proscar 5 mg daily. 11.  Diabetes mellitus with hyperglycemia.  Hemoglobin A1c 6.0.    lantus to 24u qam  12/10 CBG's reasonable today 12.  Diastolic congestive heart failure.  Monitor for any signs of fluid overload   Filed Weights   01/14/20 0600 01/15/20 1005 01/21/20 0300  Weight: 72.2 kg 73.1 kg 69.2 kg   Need weights every other day---weight down today  13.  History of gout.  Allopurinol 100 mg daily.  Monitor for any gout flareups 14.  Acute on chronic anemia with heme + stool.  Follow-up outpatient GI services for future  endoscopy colonoscopy.  Continue Protonix 40 mg daily  Transfused 1 unit PRBC on 12/5  Hemoglobin 8.7 on 12/6  GI saw patient on acute and deferred endo/colonoscopy unless gross, significant bleeding  12/10 Hgb sl down again to 8.3---transfuse if <7.5   -no gross blood in stool 15.  Hyperlipidemia.  Lipitor 16.  CAD with PCI.  Continue aspirin and Brilinta for now follow-up outpatient cardiology service.  No current plan for ischemic work-up 17.  CKD stage III.  Follow-up chemistries.  Creatinine 1.42 12/10  12/10 resuming fluids as above    LOS: 12 days A FACE TO FACE EVALUATION WAS PERFORMED  Mackenize Delgadillo P Imogean Ciampa 01/22/2020, 1:15 PM

## 2020-01-22 NOTE — Progress Notes (Signed)
Physical Therapy Session Note  Patient Details  Name: Arian Murley MRN: [id number] Date of Birth: [birth year]  Today's Date: 01/22/2020 PT Individual Time: 1445-1540 PT Individual Time Calculation (min): 55 min   Short Term Goals: Week 2:  PT Short Term Goal 1 (Week 2): Patient will perform bed mobility with max A of 1 person consistently. PT Short Term Goal 2 (Week 2): Patient will perform basic transfers with max A of 1 person consistently. PT Short Term Goal 3 (Week 2): Patient will tolerate sitting in TIS w/c >2 hours per day. PT Short Term Goal 4 (Week 2): Patient will progress with ambulation >50 feet with max A +2.  Skilled Therapeutic Interventions/Progress Updates:  Session 1  Pt received supine in bed and agreeable to PT. Supine>sit transfer with max assist and cues for awareness of LUE/LLE as well as use of the RUE to push in to sitting. Daughter(OT) present to assist with attention and perform reaching tasks throughout session. Pt performed static/dynamic sitting balance while EOB while attempting to maintain midline throughout session. PT required to facilitate lumbar and thoracic extension while lifting head to lok for various targets throughout room and make eye contact with daughter. partial stand with max assist + 2 to lift gluteal surface off bed x 2 attempts. Lateral scooting to HOB x 4 with max-total A +2 for safety. Pt noted to have only mild pushers syndrome presentation throughout session and able to perform lateral weight shift/lean to the R with only min-mod verbal cues. Intermittent initiation of reaching with the LUYE requiring mod assist to achieve full ROM.  Sit>supine completed with +2 assist for safety. Pt noted to have been incontinent. Rolling R and L with max assist to doff/don clean brief with total A for hygiene. Pt left in R sidelying in bed with call bell in reach and all needs met.          Therapy Documentation Precautions:   Precautions Precautions: Fall Precaution Comments: hx syncope, L lean, hx dementia Restrictions Weight Bearing Restrictions: No   Pain: Pain Assessment Pain Score: 0-No pain    Therapy/Group: Individual Therapy  Lorie Phenix 01/22/2020, 5:05 PM

## 2020-01-22 NOTE — Progress Notes (Signed)
Family concerned about wound to buttocks. Requested air mattress but none available at this time. Pt lying on right side at this time.

## 2020-01-22 NOTE — Progress Notes (Signed)
Occupational Therapy Session Note  Patient Details  Name: Peter [name] MRN: [id number] Date of Birth: 1930-12-23  Today's Date: 01/22/2020 OT Individual Time: 1438-8875 OT Individual Time Calculation (min): 60 min    Short Term Goals: Week 1:  OT Short Term Goal 1 (Week 1): Pt will transfer to toilet/BSC with Max A of 1 OT Short Term Goal 1 - Progress (Week 1): Not met OT Short Term Goal 2 (Week 1): Pt will static stand with no more than Mod A during ADL OT Short Term Goal 2 - Progress (Week 1): Not met OT Short Term Goal 3 (Week 1): Pt will perform UB/LB bathing with Mod A with AE PRN OT Short Term Goal 3 - Progress (Week 1): Not met OT Short Term Goal 4 (Week 1): Pt will perform sit to stand Mod A to decrease BOC OT Short Term Goal 4 - Progress (Week 1): Not met  Skilled Therapeutic Interventions/Progress Updates:    1;1. Pt received in bed agreeable to OT with pt soiled with bladder and bowel after attempting to sit EOB. Pt requires total A to change brief, wash buttocks, pt able ot wash peri area with HOB elevated, and apply protective patch to sacral wound, and don pants. Pt supine>sitting EOB with MAX A and increased time to follow commands/sequencing to A with transitional movement. Pt sits EOB with overall MAX A for sitting balance. Despite leaning onto R elbow and reaching for bed rail on R, pt still demo pushing tendencies and stooped posture at EOB with MAX A for static sit with feet grounded. Pt attempting to take crushed meds with RN but significant spillage noted. Deemed approriate to take in more suportive position. D/t significant time between afternoon sessions, pt elects to lie down and groom. Pt  washes face, applies pre shave powder and shaves with HOH A for LUE NMR in functional activity. All items on L for L attention. Exited session with pt seated in bed, exit alarm on and call light in reach   Therapy Documentation Precautions:  Precautions Precautions:  Fall Precaution Comments: hx syncope, L lean, hx dementia Restrictions Weight Bearing Restrictions: No General:   Vital Signs:  Pain:   ADL: ADL Upper Body Dressing: Maximal assistance Lower Body Dressing: Dependent Where Assessed-Lower Body Dressing: Edge of bed Toileting: Dependent Where Assessed-Toileting: Bed level Vision   Perception    Praxis   Exercises:   Other Treatments:     Therapy/Group: Individual Therapy  Tonny Branch 01/22/2020, 9:16 AM

## 2020-01-22 NOTE — Progress Notes (Signed)
Speech Language Pathology Daily Session Note  Patient Details  Name: Peter [name] MRN: [id number] Date of Birth: 1930-05-10  Today's Date: 01/22/2020 SLP Individual Time: 1410-1440 SLP Individual Time Calculation (min): 30 min  Short Term Goals: Week 2: SLP Short Term Goal 1 (Week 2): Pt will demonstrate use of speech intelligibility skills at the phrase level with 90% accuarcy with min A verbal cues. SLP Short Term Goal 2 (Week 2): Pt will recall daily and novel events with min A verbal cues for use of external aids. SLP Short Term Goal 3 (Week 2): Pt will demonstrate sustained attention to functional tasks for 15 minutes with Min verbal cues for redirection. SLP Short Term Goal 4 (Week 2): Patient will consume current diet with minimal overt s/s of aspiration and Min verbal cues for use of swallowing compensatory strategies. SLP Short Term Goal 5 (Week 2): Patient will consume trials of thin liquids with minimal overt s/s of aspiration over 2 sessions to assess readiness for repeat MBS. SLP Short Term Goal 6 (Week 2): Patient will demonstrate effficient mastication and complete oral clerance of Dys. 3 textures  without overt s/s of aspiration over 2 sessions with Min verbal cues prior to upgrade.  Skilled Therapeutic Interventions:Skilled ST services focused on education and swallow skills. Pt was asleep and unable to awake following max verbal/tatcile cues (cold compress, reposition in bed.) SLP provided education to pt's wife pertaining  To current cognitive and swallow deficits further impacted by lethargy. Pt finally awoke when his daughter entered the room and began to communicate with him. SLP provided set up assist for oral care, in which pt demonstrated mild anterior spillage and swallowed instead of spilling residue. Pt was able to carryover commands to swish and spit in the following trial, but did swallow part of a trial mixed with mouthwash/water per pt request. Pt demonstrated  mild anterior spillage via cup sips and mild oral holding with limited thin liquids and ice chips trials. Pt demonstrated no overt s/s aspiration during PO trials, however afterwards attempted to cough up "mucus." Pt demonstrate congestive vocal quality and poor respiratory strength in expelling mucus. Pt's daughter and wife reported baseline cough, but novel weakness in respiratory function. SLP provided education of planned MBS for Monday to assess aspiration risk and least restrictive diet all questions answered to satisfaction. Pt was left in room with family, call bell within reach and bed alarm set. SLP recommends to continue skilled services.     Pain Pain Assessment Pain Score: 0-No pain  Therapy/Group: Individual Therapy  Tamirah George  [facility] 01/22/2020, 2:56 PM

## 2020-01-22 NOTE — Progress Notes (Signed)
Hypoglycemic Event  CBG: 38   Treatment: Dexrtrose 50% 25 gm/ 50 ml  Symptoms: Lethargy, confusion   Follow-up CBG: Time:2135    CBG Result 140   Possible Reasons for Event:Low oral intake r/t dysphagia    Comments/MD notified: md notified on morning rounds     Merwyn Katos

## 2020-01-23 ENCOUNTER — Inpatient Hospital Stay (HOSPITAL_COMMUNITY): Payer: Medicare Other | Admitting: Physical Therapy

## 2020-01-23 ENCOUNTER — Inpatient Hospital Stay (HOSPITAL_COMMUNITY): Payer: Medicare Other

## 2020-01-23 ENCOUNTER — Inpatient Hospital Stay (HOSPITAL_COMMUNITY): Payer: Medicare Other | Admitting: Speech Pathology

## 2020-01-23 LAB — URINALYSIS, ROUTINE W REFLEX MICROSCOPIC
Bilirubin Urine: NEGATIVE
Glucose, UA: NEGATIVE mg/dL
Ketones, ur: NEGATIVE mg/dL
Nitrite: NEGATIVE
Protein, ur: 100 mg/dL — AB
Specific Gravity, Urine: 1.012 (ref 1.005–1.030)
WBC, UA: >50 WBC/hpf — ABNORMAL HIGH (ref 0–5)
pH: 5 (ref 5.0–8.0)

## 2020-01-23 LAB — BASIC METABOLIC PANEL
Anion gap: 11 (ref 5–15)
BUN: 35 mg/dL — ABNORMAL HIGH (ref 8–23)
CO2: 22 mmol/L (ref 22–32)
Calcium: 8.6 mg/dL — ABNORMAL LOW (ref 8.9–10.3)
Chloride: 106 mmol/L (ref 98–111)
Creatinine, Ser: 1.77 mg/dL — ABNORMAL HIGH (ref 0.61–1.24)
GFR, Estimated: 37 mL/min — ABNORMAL LOW (ref >60)
Glucose, Bld: 225 mg/dL — ABNORMAL HIGH (ref 70–99)
Potassium: 4 mmol/L (ref 3.5–5.1)
Sodium: 139 mmol/L (ref 135–145)

## 2020-01-23 LAB — CBC WITH DIFFERENTIAL/PLATELET
Abs Immature Granulocytes: 0.06 10*3/uL (ref 0.00–0.07)
Basophils Absolute: 0 10*3/uL (ref 0.0–0.1)
Basophils Relative: 0 %
Eosinophils Absolute: 0 10*3/uL (ref 0.0–0.5)
Eosinophils Relative: 0 %
HCT: 29 % — ABNORMAL LOW (ref 39.0–52.0)
Hemoglobin: 9.2 g/dL — ABNORMAL LOW (ref 13.0–17.0)
Immature Granulocytes: 0 %
Lymphocytes Relative: 4 %
Lymphs Abs: 0.6 10*3/uL — ABNORMAL LOW (ref 0.7–4.0)
MCH: 26.7 pg (ref 26.0–34.0)
MCHC: 31.7 g/dL (ref 30.0–36.0)
MCV: 84.3 fL (ref 80.0–100.0)
Monocytes Absolute: 0.8 10*3/uL (ref 0.1–1.0)
Monocytes Relative: 5 %
Neutro Abs: 14 10*3/uL — ABNORMAL HIGH (ref 1.7–7.7)
Neutrophils Relative %: 91 %
Platelets: 408 10*3/uL — ABNORMAL HIGH (ref 150–400)
RBC: 3.44 MIL/uL — ABNORMAL LOW (ref 4.22–5.81)
RDW: 15.9 % — ABNORMAL HIGH (ref 11.5–15.5)
WBC: 15.6 10*3/uL — ABNORMAL HIGH (ref 4.0–10.5)
nRBC: 0 % (ref 0.0–0.2)

## 2020-01-23 LAB — GLUCOSE, CAPILLARY
Glucose-Capillary: 138 mg/dL — ABNORMAL HIGH (ref 70–99)
Glucose-Capillary: 32 mg/dL — CL (ref 70–99)
Glucose-Capillary: 111 mg/dL — ABNORMAL HIGH (ref 70–99)
Glucose-Capillary: 90 mg/dL (ref 70–99)
Glucose-Capillary: 117 mg/dL — ABNORMAL HIGH (ref 70–99)
Glucose-Capillary: 191 mg/dL — ABNORMAL HIGH (ref 70–99)

## 2020-01-23 IMAGING — DX DG CHEST 1V PORT
2 series · 2 of 2 positions shown · non-contrast
Comparison: [DATE]

CLINICAL DATA: Fever.

EXAM:
PORTABLE CHEST 1 VIEW

[chest ap (1 of 2)]
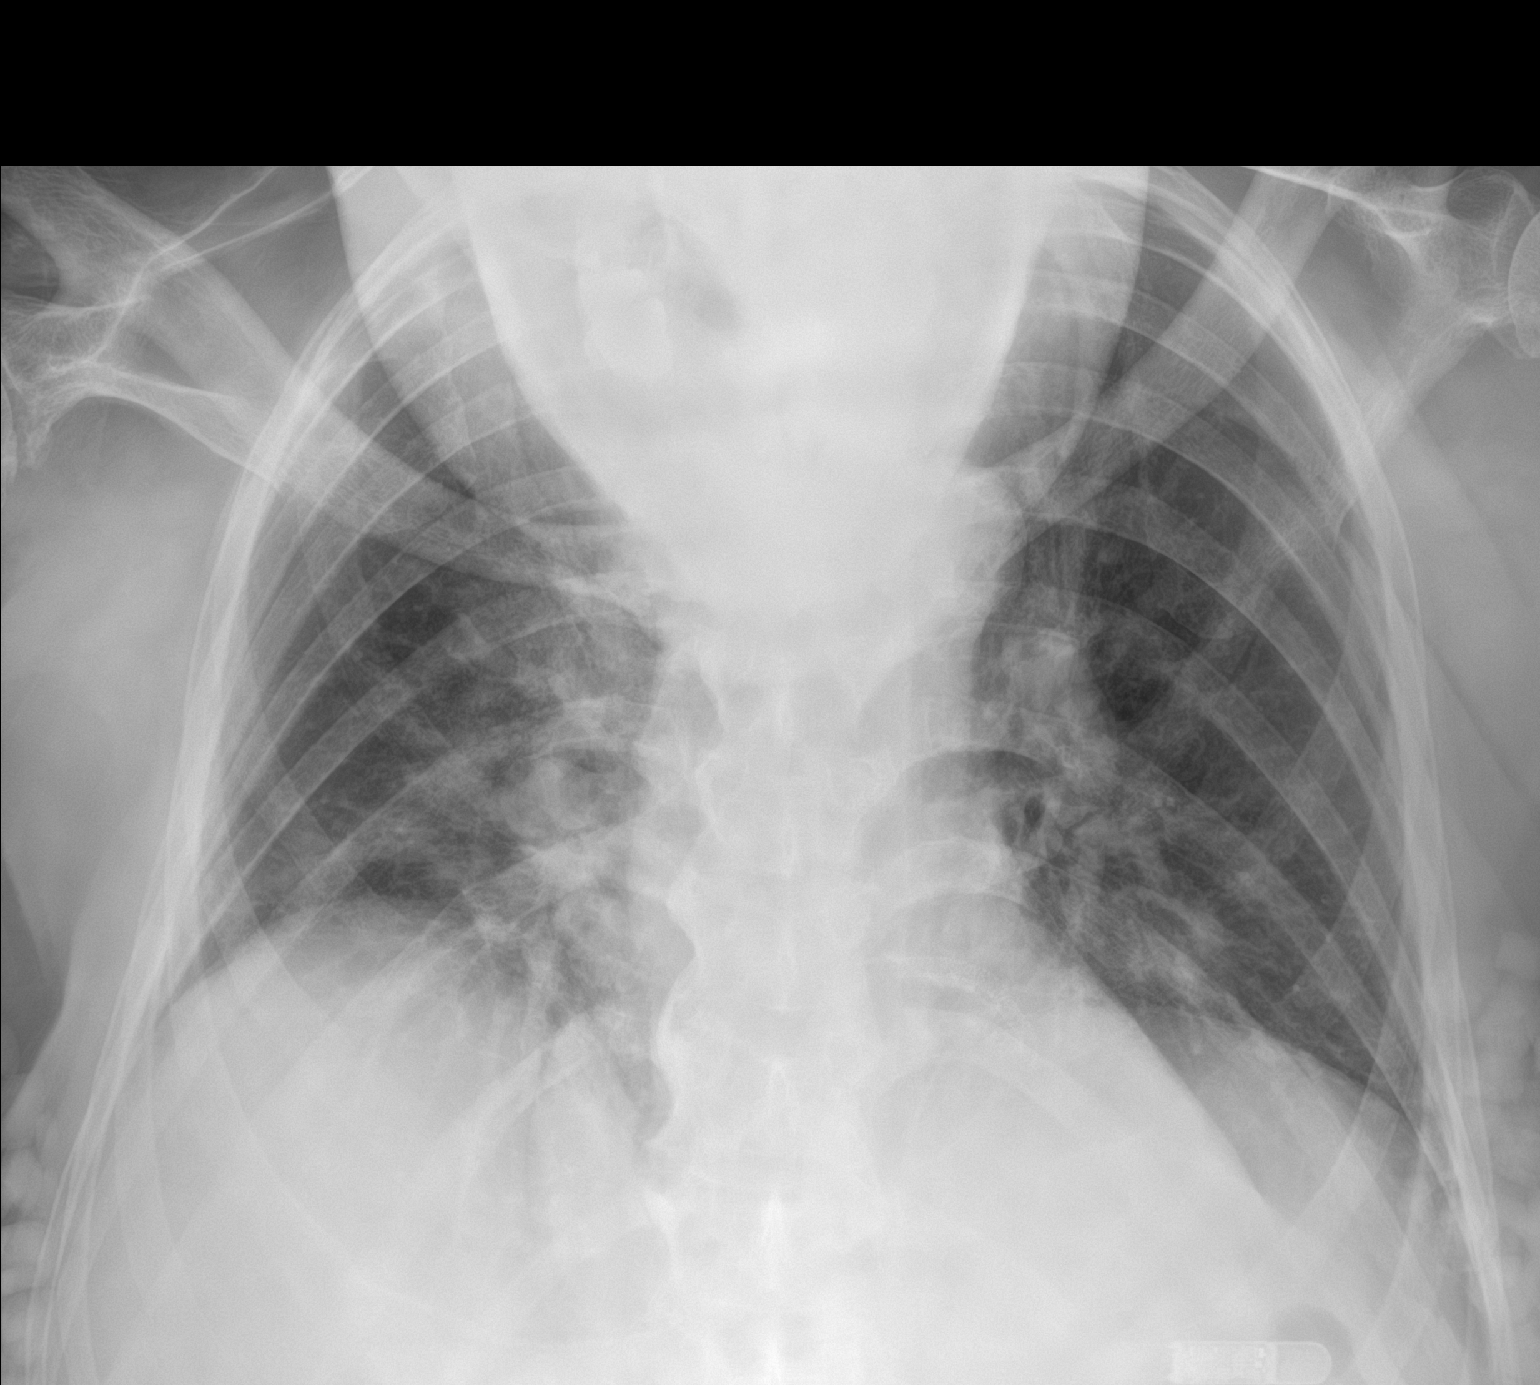

[chest ap (2 of 2)]
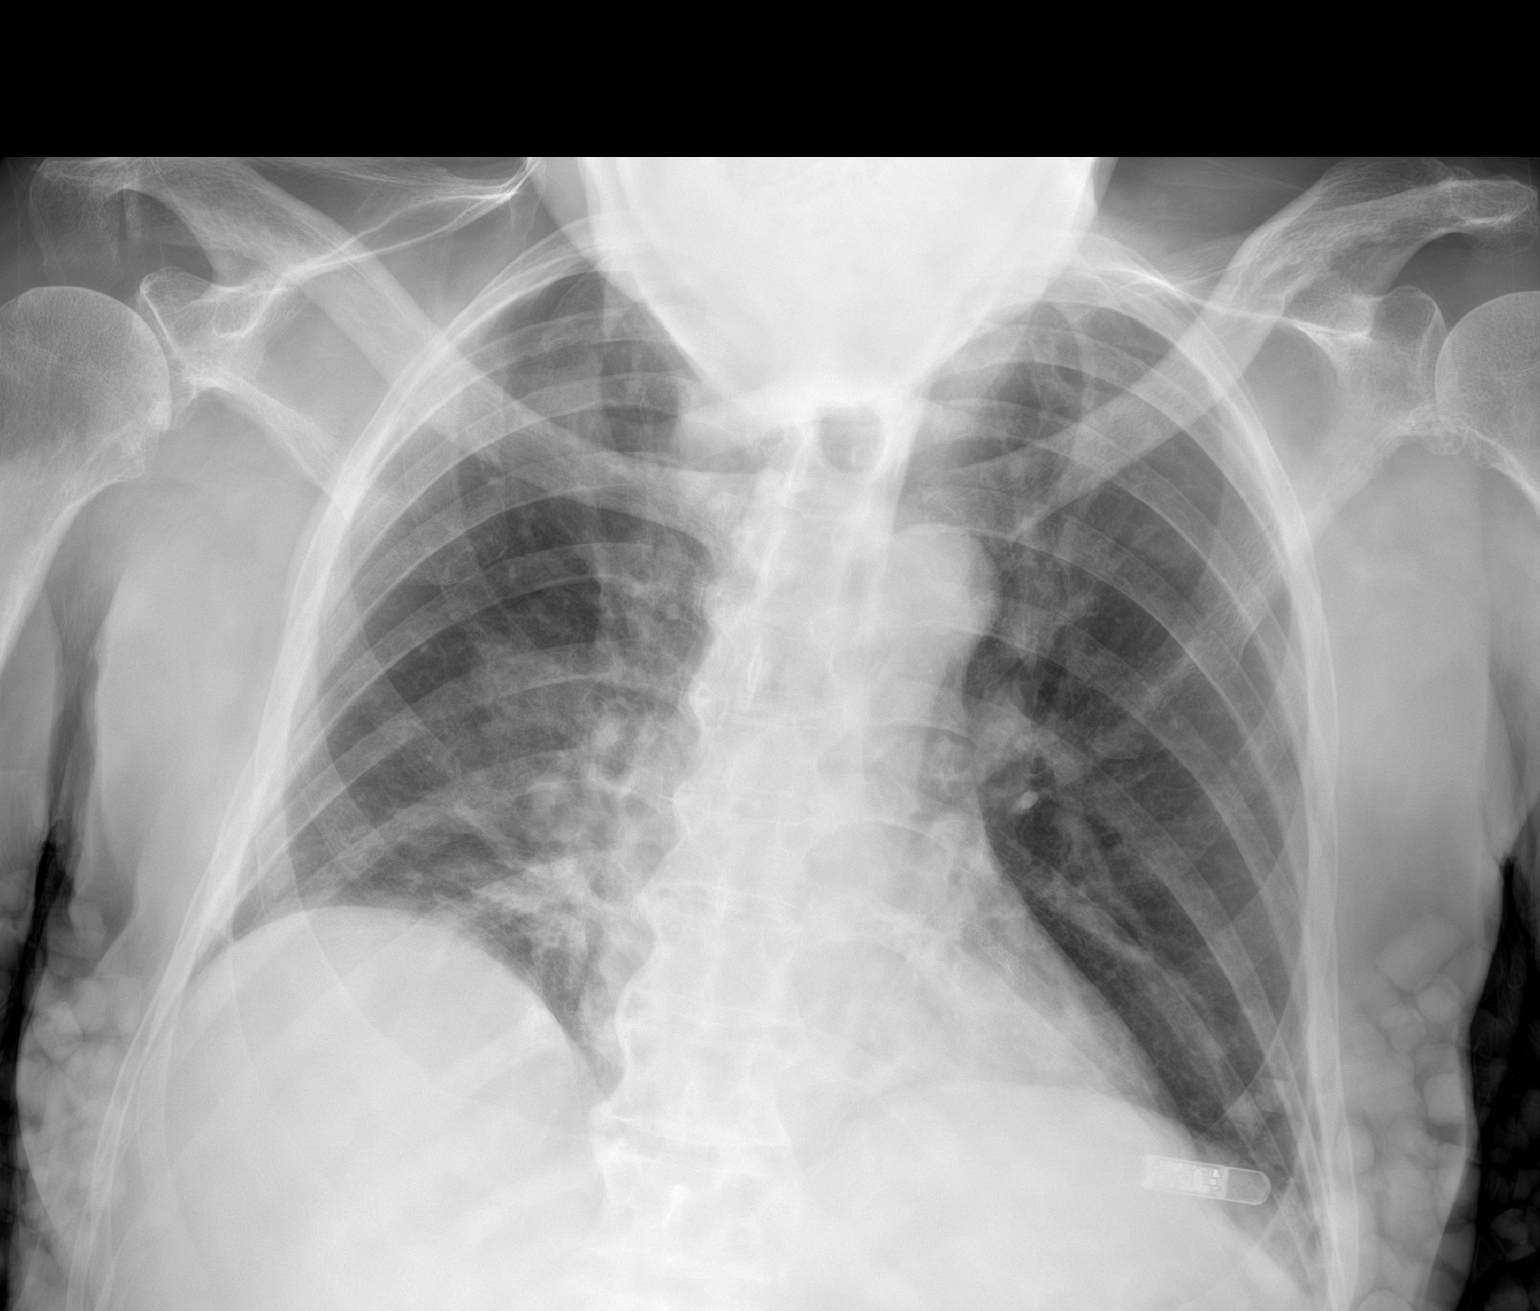

[2 of 2 positions shown; findings below may reference images not displayed]

FINDINGS: Anti lordotic positioning with patient's chin obscuring the apices.
Implanted loop recorder projects over the left chest wall. There are
new patchy opacities in both infrahilar regions. Stable heart size
and mediastinal contours. No pulmonary edema, pleural effusion or
evidence of pneumothorax. Thoracic spondylosis.
IMPRESSION: New patchy opacities in both infrahilar regions, may be atelectasis
or pneumonia. Aspiration is also considered.

## 2020-01-23 MED ORDER — DEXTROSE 50 % IV SOLN
25.0000 g | INTRAVENOUS | Status: AC
  Administered 2020-01-23: 25 g via INTRAVENOUS

## 2020-01-23 MED ORDER — ENSURE ENLIVE PO LIQD: 237.0000 mL | Freq: Two times a day (BID) | ORAL | Status: DC

## 2020-01-23 MED ORDER — LABETALOL HCL 5 MG/ML IV SOLN
5.0000 mg | Freq: Once | INTRAVENOUS | Status: AC
  Administered 2020-01-23: 5 mg via INTRAVENOUS
  Filled 2020-01-23: qty 4

## 2020-01-23 MED ORDER — GUAIFENESIN ER 600 MG PO TB12
600.0000 mg | ORAL_TABLET | Freq: Two times a day (BID) | ORAL | Status: DC
  Administered 2020-01-23 – 2020-02-04 (×21): 600 mg via ORAL
  Filled 2020-01-23 (×24): qty 1

## 2020-01-23 MED ORDER — DEXTROSE 50 % IV SOLN
INTRAVENOUS | Status: AC
  Filled 2020-01-23: qty 50

## 2020-01-23 MED ORDER — VANCOMYCIN HCL IN DEXTROSE 1-5 GM/200ML-% IV SOLN: 1000.0000 mg | INTRAVENOUS | Status: DC

## 2020-01-23 MED ORDER — INSULIN GLARGINE 100 UNIT/ML ~~LOC~~ SOLN
15.0000 [IU] | Freq: Every day | SUBCUTANEOUS | Status: DC
  Administered 2020-01-24 – 2020-01-27 (×4): 15 [IU] via SUBCUTANEOUS
  Filled 2020-01-23 (×4): qty 0.15

## 2020-01-23 MED ORDER — SODIUM CHLORIDE 0.9 % IV SOLN
2.0000 g | Freq: Two times a day (BID) | INTRAVENOUS | Status: DC
  Administered 2020-01-24 (×2): 2 g via INTRAVENOUS
  Filled 2020-01-23 (×2): qty 2

## 2020-01-23 MED ORDER — VANCOMYCIN HCL 1500 MG/300ML IV SOLN
1500.0000 mg | Freq: Once | INTRAVENOUS | Status: AC
  Administered 2020-01-23: 1500 mg via INTRAVENOUS
  Filled 2020-01-23: qty 300

## 2020-01-23 NOTE — Progress Notes (Signed)
VS return to WNL except blood pressure remains elevated temperature 99.9 F leukocytosis noted on CBC yellow mews protocol in progress   01/23/20 2353  Vitals  Temp 99.9 F (37.7 C)  BP (!) 158/46  MAP (mmHg) 74  BP Location Left Arm  BP Method Automatic  Patient Position (if appropriate) Lying  Pulse Rate 76  Pulse Rate Source Monitor  Resp 20  MEWS COLOR  MEWS Score Color Green  Oxygen Therapy  SpO2 96 %  O2 Device Room Air  MEWS Score  MEWS Temp 0  MEWS Systolic 0  MEWS Pulse 0  MEWS RR 0  MEWS LOC 0  MEWS Score 0

## 2020-01-23 NOTE — Progress Notes (Addendum)
Pharmacy Antibiotic Note  Peter [name] is a 86 y.o. male with fever.  Pharmacy has been consulted for cefepime and vancomycin dosing. -WBC= 8.4, tmax= 102.4, SCr- 1.42, CrCl ~ 35  Plan: -Cefepime 2gm IV q12h -Vancomycin 1500mg  IV followed by 1000mg  IV q24h   Height: 5\' 7"  (170.2 cm) Weight: 69.2 kg (152 lb 8.9 oz) IBW/kg (Calculated) : 66.1  Temp (24hrs), Avg:99.6 F (37.6 C), Min:98 F (36.7 C), Max:102.4 F (39.1 C)  Recent Labs  Lab 01/17/20 0626 01/21/20 0410  WBC 12.4* 8.4  CREATININE 1.33* 1.42*    Estimated Creatinine Clearance: 34.3 mL/min (A) (by C-G formula based on SCr of 1.42 mg/dL (H)).    Allergies  Allergen Reactions  . Penicillins Anaphylaxis    Reported airway compromise 50 years    Antimicrobials this admission: 12/12 Cefepime 12/12 vanc   Thank you for allowing pharmacy to be a part of this patient's care.  Hildred Laser, PharmD Clinical Pharmacist **Pharmacist phone directory can now be found on [email or web address] ([email or web address]).  Listed under [place].

## 2020-01-23 NOTE — Progress Notes (Signed)
Speech Language Pathology Daily Session Note  Patient Details  Name: Peter [name] MRN: [id number] Date of Birth: 1930/07/05  Today's Date: 01/23/2020 SLP Individual Time: 1136-1150 SLP Individual Time Calculation (min): 14 min  Short Term Goals: Week 2: SLP Short Term Goal 1 (Week 2): Pt will demonstrate use of speech intelligibility skills at the phrase level with 90% accuarcy with min A verbal cues. SLP Short Term Goal 2 (Week 2): Pt will recall daily and novel events with min A verbal cues for use of external aids. SLP Short Term Goal 3 (Week 2): Pt will demonstrate sustained attention to functional tasks for 15 minutes with Min verbal cues for redirection. SLP Short Term Goal 4 (Week 2): Patient will consume current diet with minimal overt s/s of aspiration and Min verbal cues for use of swallowing compensatory strategies. SLP Short Term Goal 5 (Week 2): Patient will consume trials of thin liquids with minimal overt s/s of aspiration over 2 sessions to assess readiness for repeat MBS. SLP Short Term Goal 6 (Week 2): Patient will demonstrate effficient mastication and complete oral clerance of Dys. 3 textures  without overt s/s of aspiration over 2 sessions with Min verbal cues prior to upgrade.  Skilled Therapeutic Interventions:  Pt was seen for skilled ST targeting cognitive goals.  Pt received laying in bed, soundly asleep with OT at bedside.  OT reports poor arousal despite attempts to sit at edge of bed.  SLP removed blankets from pt, applied cold compress, turned lights to room on, used sternal rub, talked in a loud voice, and turned on loud music for pt in the attempts of improving alertness with no success.  As a result, session was ended early due to pt fatigue.  Pt was left in bed with bed alarm set and call bell within reach.  Continue per current plan of care.    Pain Pain Assessment Pain Scale: 0-10 Pain Score: 0-No pain   Therapy/Group: Individual Therapy  Peter [name],  Selinda Orion 01/23/2020, 12:51 PM

## 2020-01-23 NOTE — Progress Notes (Signed)
Occupational Therapy Session Note  Patient Details  Name: Peter [name] MRN: [id number] Date of Birth: 12-10-1930  Today's Date: 01/23/2020 OT Individual Time: 1100-1130 OT Individual Time Calculation (min): 30 min    Short Term Goals: Week 1:  OT Short Term Goal 1 (Week 1): Pt will transfer to toilet/BSC with Max A of 1 OT Short Term Goal 1 - Progress (Week 1): Not met OT Short Term Goal 2 (Week 1): Pt will static stand with no more than Mod A during ADL OT Short Term Goal 2 - Progress (Week 1): Not met OT Short Term Goal 3 (Week 1): Pt will perform UB/LB bathing with Mod A with AE PRN OT Short Term Goal 3 - Progress (Week 1): Not met OT Short Term Goal 4 (Week 1): Pt will perform sit to stand Mod A to decrease BOC OT Short Term Goal 4 - Progress (Week 1): Not met  Skilled Therapeutic Interventions/Progress Updates:    initial tx delayed d/t hypoglycemic event. Hold tx till blood glucose higher. Later upon return blood glucose stable and pt responsive to therapist reporting need to use urinal. Despite increased time, pt unable to void in urinal. Pt brief clean at beginning of session. Pt rolls with MAX A+2 in B direction s/t decreasing arousal. Pt completes supine>sitting EOB with +2 A and able to follow commands to hold onto bed rail at footboard to decrease pushing. Pt audibly snoring sitting up with MAX A for sitting balance d/t low arousal. Arousal limitations decreased safety to get into TIS. Left beezy board in room for pt to trial when alert. Left room with pt returned to supine and music playing in background to improve arousal prior to SLP session. Pt missed 30 min of tx d/t decreased arousal/fatigue. Will follow up per POC.  Therapy Documentation Precautions:  Precautions Precautions: Fall Precaution Comments: hx syncope, L lean, hx dementia Restrictions Weight Bearing Restrictions: No General:   Vital Signs: Therapy Vitals Temp: 98 F (36.7 C) Temp Source:  Oral Pulse Rate: (!) 59 Resp: 17 BP: (!) 156/41 Patient Position (if appropriate): Lying Oxygen Therapy SpO2: 97 % O2 Device: Room Air Pain: Pain Assessment Pain Scale: 0-10 Pain Score: Asleep ADL: ADL Upper Body Dressing: Maximal assistance Lower Body Dressing: Dependent Where Assessed-Lower Body Dressing: Edge of bed Toileting: Dependent Where Assessed-Toileting: Bed level Vision   Perception    Praxis   Exercises:   Other Treatments:     Therapy/Group: Individual Therapy  Tonny Branch 01/23/2020, 6:57 AM

## 2020-01-23 NOTE — Progress Notes (Signed)
[facility] PHYSICAL MEDICINE & REHABILITATION PROGRESS NOTE   Subjective/Complaints: No complaints Had CBG 38 and 32 today- decreased Lantus to 15U. Has not been eating much. Called daughter to discuss foods he likes.  Fatigue limiting his therapy sessions.   ROS: Patient denies fever, rash, sore throat, blurred vision, nausea, vomiting, diarrhea, cough, shortness of breath or chest pain,  or mood change.    Objective:   No results found. Recent Labs    01/21/20 0410  WBC 8.4  HGB 8.3*  HCT 24.9*  PLT 326   Recent Labs    01/21/20 0410  NA 140  K 3.9  CL 107  CO2 21*  GLUCOSE 88  BUN 33*  CREATININE 1.42*  CALCIUM 8.4*    Intake/Output Summary (Last 24 hours) at 01/23/2020 1420 Last data filed at 01/23/2020 0730 Gross per 24 hour  Intake 626.47 ml  Output 275 ml  Net 351.47 ml    Physical Exam: Vital Signs Blood pressure (!) 156/41, pulse (!) 59, temperature 98 F (36.7 C), temperature source Oral, resp. rate 17, height 5\' 7"  (1.702 m), weight 69.2 kg, SpO2 97 %. Gen: no distress, normal appearing HEENT: oral mucosa pink and moist, NCAT Cardio: Bradycardia Chest: normal effort, normal rate of breathing Abd: soft, non-distended Ext: no edema Skin: Warm and dry. ?mild redness at sacrum. Necrotic right 2nd toe--no changes Musc: No edema in extremities.  No tenderness in extremities. Intrinsic muscle atrophy bilateral hands Neuro: appears a little more groggy today. Speech dysarthric, weaker. oriented to person, place, reason, month left central 7 ongoing Follows simple commands.  Motor: RUE/RLE: 4-4+ /5 proximal distal LUE: 3/5 proximal distal LLE: 2+ to 3/5 proximal to distal--motor exam unchanged  Assessment/Plan: 1. Functional deficits which require 3+ hours per day of interdisciplinary therapy in a comprehensive inpatient rehab setting.  Physiatrist is providing close team supervision and 24 hour management of active medical problems listed  below.  Physiatrist and rehab team continue to assess barriers to discharge/monitor patient progress toward functional and medical goals  Care Tool:  Bathing    Body parts bathed by patient: Right arm,Left arm,Chest,Abdomen,Front perineal area,Right upper leg,Left upper leg,Face   Body parts bathed by helper: Buttocks,Right lower leg,Left lower leg     Bathing assist Assist Level: Maximal Assistance - Patient 24 - 49%     Upper Body Dressing/Undressing Upper body dressing   What is the patient wearing?: Pull over shirt    Upper body assist Assist Level: Maximal Assistance - Patient 25 - 49% (sitting EOB)    Lower Body Dressing/Undressing Lower body dressing      What is the patient wearing?: Pants     Lower body assist Assist for lower body dressing: Maximal Assistance - Patient 25 - 49%     Toileting Toileting    Toileting assist Assist for toileting: Dependent - Patient 0% (3 helpers)     Transfers Chair/bed transfer  Transfers assist     Chair/bed transfer assist level: 2 Helpers     Locomotion Ambulation   Ambulation assist      Assist level: 2 helpers Assistive device: Parallel bars Max distance: 6 ft   Walk 10 feet activity   Assist     Assist level: 2 helpers Assistive device: Hand held assist   Walk 50 feet activity   Assist Walk 50 feet with 2 turns activity did not occur: Safety/medical concerns  Assist level: 2 helpers Assistive device: Hand held assist    Walk 150  feet activity   Assist Walk 150 feet activity did not occur: Safety/medical concerns         Walk 10 feet on uneven surface  activity   Assist Walk 10 feet on uneven surfaces activity did not occur: Safety/medical concerns         Wheelchair     Assist Will patient use wheelchair at discharge?: No             Wheelchair 50 feet with 2 turns activity    Assist            Wheelchair 150 feet activity     Assist           Blood pressure (!) 156/41, pulse (!) 59, temperature 98 F (36.7 C), temperature source Oral, resp. rate 17, height 5\' 7"  (1.702 m), weight 69.2 kg, SpO2 97 %.    Medical Problem List and Plan: 1.  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 Involvement of right frontal lobe too.  Appreciate neurology recs.  - palliative care left a note that they are following. Appreciate their support!  -working toward 12/28 DC date  -have spoken with daughter this week after team conference regarding date, new goals, potential needs moving forward.   -appreciate neurology help.   -Continue CIR 2.  Antithrombotics: -DVT/anticoagulation: SCDs             -antiplatelet therapy: Aspirin 81 mg daily and Brilinta 90 mg twice daily x3 months then Brilinta alone 3. Pain Management: Neurontin 300 mg twice daily, Cymbalta 30 mg daily.   -has pain in right foot at night which is ongoing  - scheduled tylenol 4. Mood: Namenda 10 mg twice daily, Aricept 10 mg daily             -antipsychotic agents: N/A  -added LOW dose ritalin 2.5mg  for arousal/initiation starting 12/8  12/9 increase ritalin to 5mg  as he's tolerating and the 2.5 seems to be helping somewhat---->observe 12/10 5. Neuropsych: This patient is capable of making decisions on his own behalf. 6. Skin/Wound Care: Routine skin checks  -kerlix wrap  for right foot to keep ischemic toe from catching  Continue pressure relief for coccyx ulcer 7. Fluids/Electrolytes/Nutrition: family/staff to encourage PO  -12/10 after a better Wednesday, ate less than 25% Thursday   -prealbumin only 18.9   -BUN/Cr sl up---> resume HS IVF   -continue K+ supplement (3.9)   -add low dose remeron for appetite   -pt does not want NGT   -discussed what foods patient likes with daughter- wife and sister are  coming in today and will bring in yogurt for him. He also likes grits and oatmeal- placed nursing order. Added ensure BID which daughter states he likes.  8.  Dysphagia.    D2 nectars, advance diet as tolerated 9.  Hypertension.  Norvasc 10 mg daily, Coreg 6.25 mg twice daily, Cardura 4 mg daily, hydralazine 100 mg 3 times daily, HCTZ 12.5 mg daily, Imdur 60 mg daily.   Fair control 12/11. Allow some elevation for perfusion 10.  History of prostate cancer.  Continue Proscar 5 mg daily. 11.  Diabetes mellitus with hyperglycemia.  Hemoglobin A1c 6.0.    lantus to 24u qam  12/12: CBGs decreased to 38 and 32. Updated daughter. Decreased Lantus to 00X.  12.  Diastolic  congestive heart failure.  Monitor for any signs of fluid overload   Filed Weights   01/14/20 0600 01/15/20 1005 01/21/20 0300  Weight: 72.2 kg 73.1 kg 69.2 kg   12/12: weight down, discussed with daughter importance of him eating.   13.  History of gout.  Allopurinol 100 mg daily.  Monitor for any gout flareups 14.  Acute on chronic anemia with heme + stool.  Follow-up outpatient GI services for future endoscopy colonoscopy.  Continue Protonix 40 mg daily  Transfused 1 unit PRBC on 12/5  Hemoglobin 8.7 on 12/6  GI saw patient on acute and deferred endo/colonoscopy unless gross, significant bleeding  12/10 Hgb sl down again to 8.3---transfuse if <7.5   -no gross blood in stool 15.  Hyperlipidemia.  Lipitor 16.  CAD with PCI.  Continue aspirin and Brilinta for now follow-up outpatient cardiology service.  No current plan for ischemic work-up 17.  CKD stage III.  Follow-up chemistries.  Creatinine 1.42 12/10  12/10 resuming fluids as above    LOS: 13 days A FACE TO FACE EVALUATION WAS PERFORMED  Shawne Eskelson P Wilmar Prabhakar 01/23/2020, 2:20 PM

## 2020-01-23 NOTE — Progress Notes (Signed)
Hypoglycemic Event  CBG: 32  Treatment: D50 50 mL (25 gm)  Symptoms: None  Follow-up CBG: Time:1014 CBG Result:138  Possible Reasons for Event: Inadequate meal intake  Comments/MD notified:MD Raulkar notified    Peter [name] Peter [name]

## 2020-01-23 NOTE — Progress Notes (Signed)
Pt refused breakfast and lunch, pt noted with a lot of congestion this am. Sat pt up for a bit and breathing improved. Pt still having difficulty with productive cough today, however seems to be much better than yesterday. Family at bedside for dinner, pt seemed to do better with meal and only did liquids very teaspoon. Family questioned changed. Informed that needs to clear via several swallows to clear fluid and food. Family verbalized an understanding. Pt sitting upright and taking with family. Bed alarm on and functioning.

## 2020-01-23 NOTE — Progress Notes (Signed)
On routine vital sign schedule it was noted that the patient felt hot to the touch and was breathing faster than normal. Vital signs triggered a yellow mews protocol and this has been implemented. Blood pressures elevated related to hold of coreg for low pulse. Charge nurse and physician notified  as below and new orders for blood cultures x2 , Chest x-ray, urine analysis and culture, vancomycin and Cefepime antibiotics received. Urine culture has been sent to the lab significant for Cloudy urine results below  and blood cultures have been collected before the administration of IV antibiotics as ordered Chest x-ray collected at 2234pm. Will continue mews protocol as ordered. Patient bed in lowest position with the call light in reach. Safety ensured       01/23/20 2151  Vitals  Temp (!) 102.4 F (39.1 C)  Temp Source Oral  BP (!) 178/84  BP Location Right Arm  BP Method Manual  Patient Position (if appropriate) Lying  Pulse Rate 77  Pulse Rate Source Dinamap  Resp (!) 22  Level of Consciousness  Level of Consciousness Alert  MEWS COLOR  MEWS Score Color Yellow  Pain Assessment  Pain Scale 0-10  Pain Score 0  MEWS Score  MEWS Temp 2  MEWS Systolic 0  MEWS Pulse 0  MEWS RR 1  MEWS LOC 0  MEWS Score 3  Provider Notification  Provider Name/Title Dr. Ranell Patrick  Date Provider Notified 01/23/20  Time Provider Notified 2200  Notification Type Call  Notification Reason Change in status  Response See new orders  Date of Provider Response 01/23/20  Time of Provider Response 2201   Results for Gencarelli, Keen Ewalt" (MRN [id number]) as of 01/23/2020 23:18  Ref. Range 01/23/2020 22:15  URINALYSIS, ROUTINE W REFLEX MICROSCOPIC Unknown Rpt (A)  Appearance Latest Ref Range: CLEAR  CLOUDY (A)  Bilirubin Urine Latest Ref Range: NEGATIVE  NEGATIVE  Color, Urine Latest Ref Range: YELLOW  YELLOW  Glucose, UA Latest Ref Range: NEGATIVE mg/dL NEGATIVE  Hgb urine dipstick Latest Ref Range:  NEGATIVE  SMALL (A)  Ketones, ur Latest Ref Range: NEGATIVE mg/dL NEGATIVE  Leukocytes,Ua Latest Ref Range: NEGATIVE  LARGE (A)  Nitrite Latest Ref Range: NEGATIVE  NEGATIVE  pH Latest Ref Range: 5.0 - 8.0  5.0  Protein Latest Ref Range: NEGATIVE mg/dL 100 (A)  Specific Gravity, Urine Latest Ref Range: 1.005 - 1.030  1.012  Bacteria, UA Latest Ref Range: NONE SEEN  RARE (A)  RBC / HPF Latest Ref Range: 0 - 5 RBC/hpf 11-20  WBC Clumps Unknown PRESENT  WBC, UA Latest Ref Range: 0 - 5 WBC/hpf >50 (H)

## 2020-01-23 NOTE — Progress Notes (Signed)
Physical Therapy Session Note  Patient Details  Name: Peter [name] MRN: [id number] Date of Birth: 09-01-1930  Today's Date: 01/23/2020 PT Individual Time: 1400-1420 PT Individual Time Calculation (min): 20 min  PT Amount of Missed Time (min): 25 Minutes PT Missed Treatment Reason: Patient fatigue  Short Term Goals: Week 2:  PT Short Term Goal 1 (Week 2): Patient will perform bed mobility with max A of 1 person consistently. PT Short Term Goal 2 (Week 2): Patient will perform basic transfers with max A of 1 person consistently. PT Short Term Goal 3 (Week 2): Patient will tolerate sitting in TIS w/c >2 hours per day. PT Short Term Goal 4 (Week 2): Patient will progress with ambulation >50 feet with max A +2.  Skilled Therapeutic Interventions/Progress Updates:    Pt received seated in bed, agreeable to PT session. Pt reports soreness in his back and LE, not rated and premedicated prior to start of therapy session. Pt agreeable to sit up to EOB. Supine to sit with assist x 2 for LE management and trunk control. Pt requires total A for sitting balance EOB due to posterior lean and L lateral lean. Pt unable to keep eyes open once seated EOB and unable to assist with maintaining sitting balance. Pt returned to supine with assist x 2 for trunk control and LE management. Pt is assist x 2 to scoot up towards [place]. Pt unable to stay awake to continue participation in therapy session. Pt left seated in bed with needs in reach, bed alarm in place. Pt missed 25 min of scheduled therapy session due to fatigue and inability to stay awake to participate in session.  Therapy Documentation Precautions:  Precautions Precautions: Fall Precaution Comments: hx syncope, L lean, hx dementia Restrictions Weight Bearing Restrictions: No   Therapy/Group: Individual Therapy   Excell Seltzer, PT, DPT  01/23/2020, 2:21 PM

## 2020-01-24 ENCOUNTER — Inpatient Hospital Stay (HOSPITAL_COMMUNITY): Payer: Medicare Other

## 2020-01-24 ENCOUNTER — Other Ambulatory Visit: Payer: Self-pay | Admitting: Family Medicine

## 2020-01-24 ENCOUNTER — Inpatient Hospital Stay (HOSPITAL_COMMUNITY): Payer: Medicare Other | Admitting: Occupational Therapy

## 2020-01-24 ENCOUNTER — Encounter (HOSPITAL_COMMUNITY): Payer: Medicare Other

## 2020-01-24 DIAGNOSIS — D62 Acute posthemorrhagic anemia: Secondary | ICD-10-CM

## 2020-01-24 DIAGNOSIS — R509 Fever, unspecified: Secondary | ICD-10-CM

## 2020-01-24 DIAGNOSIS — D72829 Elevated white blood cell count, unspecified: Secondary | ICD-10-CM

## 2020-01-24 DIAGNOSIS — A499 Bacterial infection, unspecified: Secondary | ICD-10-CM

## 2020-01-24 DIAGNOSIS — N390 Urinary tract infection, site not specified: Secondary | ICD-10-CM

## 2020-01-24 DIAGNOSIS — R1312 Dysphagia, oropharyngeal phase: Secondary | ICD-10-CM

## 2020-01-24 LAB — GLUCOSE, CAPILLARY
Glucose-Capillary: 177 mg/dL — ABNORMAL HIGH (ref 70–99)
Glucose-Capillary: 143 mg/dL — ABNORMAL HIGH (ref 70–99)
Glucose-Capillary: 124 mg/dL — ABNORMAL HIGH (ref 70–99)
Glucose-Capillary: 120 mg/dL — ABNORMAL HIGH (ref 70–99)

## 2020-01-24 LAB — CBC
HCT: 24.7 % — ABNORMAL LOW (ref 39.0–52.0)
Hemoglobin: 7.8 g/dL — ABNORMAL LOW (ref 13.0–17.0)
MCH: 26.6 pg (ref 26.0–34.0)
MCHC: 31.6 g/dL (ref 30.0–36.0)
MCV: 84.3 fL (ref 80.0–100.0)
Platelets: 338 10*3/uL (ref 150–400)
RBC: 2.93 MIL/uL — ABNORMAL LOW (ref 4.22–5.81)
RDW: 15.8 % — ABNORMAL HIGH (ref 11.5–15.5)
WBC: 17.5 10*3/uL — ABNORMAL HIGH (ref 4.0–10.5)
nRBC: 0 % (ref 0.0–0.2)

## 2020-01-24 LAB — MRSA PCR SCREENING: MRSA by PCR: NEGATIVE

## 2020-01-24 MED ORDER — SODIUM CHLORIDE 0.9 % IV SOLN: INTRAVENOUS | Status: DC

## 2020-01-24 MED ORDER — ENSURE ENLIVE PO LIQD
237.0000 mL | Freq: Three times a day (TID) | ORAL | Status: DC
  Administered 2020-01-25: 237 mL via ORAL
  Filled 2020-01-24 (×3): qty 237

## 2020-01-24 MED ORDER — SODIUM CHLORIDE 0.9 % IV SOLN
2.0000 g | INTRAVENOUS | Status: DC
  Administered 2020-01-25 – 2020-01-27 (×3): 2 g via INTRAVENOUS
  Filled 2020-01-24 (×3): qty 2

## 2020-01-24 MED ORDER — VANCOMYCIN HCL 750 MG/150ML IV SOLN
750.0000 mg | INTRAVENOUS | Status: DC
  Administered 2020-01-24: 750 mg via INTRAVENOUS
  Filled 2020-01-24: qty 150

## 2020-01-24 MED ORDER — ACETAMINOPHEN 650 MG RE SUPP: 650.0000 mg | RECTAL | Status: DC | PRN

## 2020-01-24 NOTE — Progress Notes (Addendum)
[facility])  Request to have joint family meeting for [place] with hospital staff on 12/14 at 1130.  [name] is our current palliative patient and we will be happy to assist with GOC to plan for further discharge planning.  Thank you, Venia Carbon RN, BSN, [facility] Liaison   **Called to offer support to the dtr [name] at the bedside, she was tearful and requested that someone come speak to them today.  [place] to meet her at the bedside this afternoon.

## 2020-01-24 NOTE — Progress Notes (Signed)
Peter [name] PHYSICAL MEDICINE & REHABILITATION PROGRESS NOTE   Subjective/Complaints: Lying in bed. Not eating much this weekend. Developed fever yesterday. More lethargic this morning  ROS: Limited due to cognitive/behavioral  Objective:   DG CHEST PORT 1 VIEW  Result Date: 01/23/2020 CLINICAL DATA:  Fever. EXAM: PORTABLE CHEST 1 VIEW COMPARISON:  01/17/2020 FINDINGS: Anti lordotic positioning with patient's chin obscuring the apices. Implanted loop recorder projects over the left chest wall. There are new patchy opacities in both infrahilar regions. Stable heart size and mediastinal contours. No pulmonary edema, pleural effusion or evidence of pneumothorax. Thoracic spondylosis. IMPRESSION: New patchy opacities in both infrahilar regions, may be atelectasis or pneumonia. Aspiration is also considered. Electronically Signed   By: Keith Rake M.D.   On: 01/23/2020 23:40   Recent Labs    01/23/20 2242 01/24/20 0610  WBC 15.6* 17.5*  HGB 9.2* 7.8*  HCT 29.0* 24.7*  PLT 408* 338   Recent Labs    01/23/20 2242  NA 139  K 4.0  CL 106  CO2 22  GLUCOSE 225*  BUN 35*  CREATININE 1.77*  CALCIUM 8.6*    Intake/Output Summary (Last 24 hours) at 01/24/2020 0933 Last data filed at 01/24/2020 0305 Gross per 24 hour  Intake 629.5 ml  Output 200 ml  Net 429.5 ml    Physical Exam: Vital Signs Blood pressure (!) 137/43, pulse (!) 59, temperature 97.9 F (36.6 C), temperature source Oral, resp. rate 18, height 5\' 7"  (1.702 m), weight 69.2 kg, SpO2 97 %. Constitutional: No distress . Vital signs reviewed. HEENT: EOMI, oral membranes moist Neck: supple Cardiovascular: RRR without murmur. No JVD    Respiratory/Chest: poor effort, no obvious rales GI/Abdomen: BS +, non-tender, non-distended Ext: no clubbing, cyanosis, or edema Psych: flat, confused. Skin: Warm ?mild redness at sacrum. Necrotic right 2nd toe--no changes Musc: No edema in extremities.  No tenderness in  extremities. Intrinsic muscle atrophy bilateral hands Neuro: more lethargic and confused. Not initiating with left side. Speech more slurred.  Motor: RUE/RLE: 4-4+ /5 proximal distal LUE: 1-2/5 proximal distal LLE: 12/5 proximal to distal-- more inattentive to left  Assessment/Plan: 1. Functional deficits which require 3+ hours per day of interdisciplinary therapy in a comprehensive inpatient rehab setting.  Physiatrist is providing close team supervision and 24 hour management of active medical problems listed below.  Physiatrist and rehab team continue to assess barriers to discharge/monitor patient progress toward functional and medical goals  Care Tool:  Bathing    Body parts bathed by patient: Right arm,Left arm,Chest,Abdomen,Front perineal area,Right upper leg,Left upper leg,Face   Body parts bathed by helper: Buttocks,Right lower leg,Left lower leg     Bathing assist Assist Level: Maximal Assistance - Patient 24 - 49%     Upper Body Dressing/Undressing Upper body dressing   What is the patient wearing?: Pull over shirt    Upper body assist Assist Level: Maximal Assistance - Patient 25 - 49% (sitting EOB)    Lower Body Dressing/Undressing Lower body dressing      What is the patient wearing?: Pants     Lower body assist Assist for lower body dressing: Maximal Assistance - Patient 25 - 49%     Toileting Toileting    Toileting assist Assist for toileting: Dependent - Patient 0% (3 helpers)     Transfers Chair/bed transfer  Transfers assist     Chair/bed transfer assist level: 2 Helpers     Locomotion Ambulation   Ambulation assist  Assist level: 2 helpers Assistive device: Parallel bars Max distance: 6 ft   Walk 10 feet activity   Assist     Assist level: 2 helpers Assistive device: Hand held assist   Walk 50 feet activity   Assist Walk 50 feet with 2 turns activity did not occur: Safety/medical concerns  Assist level: 2  helpers Assistive device: Hand held assist    Walk 150 feet activity   Assist Walk 150 feet activity did not occur: Safety/medical concerns         Walk 10 feet on uneven surface  activity   Assist Walk 10 feet on uneven surfaces activity did not occur: Safety/medical concerns         Wheelchair     Assist Will patient use wheelchair at discharge?: No             Wheelchair 50 feet with 2 turns activity    Assist            Wheelchair 150 feet activity     Assist          Blood pressure (!) 137/43, pulse (!) 59, temperature 97.9 F (36.6 C), temperature source Oral, resp. rate 18, height 5\' 7"  (1.702 m), weight 69.2 kg, SpO2 97 %.    Medical Problem List and Plan: 1.  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 Involvement of right frontal lobe too.  Appreciate neurology recs.  -working toward 12/28 DC date   -appreciate neurology help.   -Continue CIR therapies as tolerated  12/13 spoke with daughter at length re: multiple issues. New lethargy is likely d/t febrile illness (see below).  Will treat infection, treat supportively. Can't make longer term decision based on infection we've been treating less than 12 hours. Still would like palliative care to be involved with case here in hospital.  2.  Antithrombotics: -DVT/anticoagulation: SCDs             -antiplatelet therapy: Aspirin 81 mg daily and Brilinta 90 mg twice daily x3 months then Brilinta alone   -12/13 daughter would like to speak with neurology re: brillinta vs plavix 3. Pain Management: Neurontin 300 mg twice daily, Cymbalta 30 mg daily.   -has pain in right foot at night which is ongoing  - scheduled tylenol 4. Mood: Namenda 10 mg twice daily, Aricept 10 mg daily             -antipsychotic agents:  N/A  -added LOW dose ritalin 2.5mg  for arousal/initiation starting 12/8  12/13 continue low dose ritalin for now.  5. Neuropsych: This patient is capable of making decisions on his own behalf. 6. Skin/Wound Care: Routine skin checks  -kerlix wrap  for right foot to keep ischemic toe from catching  Continue pressure relief for coccyx ulcer 7. Fluids/Electrolytes/Nutrition: family/staff to encourage PO  -12/10 after a better Wednesday, ate less than 25% Thursday   -prealbumin only 18.9   -BUN/Cr sl up---> resume HS IVF   -continue K+ supplement (3.9)   -add low dose remeron for appetite   -pt does not want NGT  12/13 -discussed NGT with daughter today. He's lost ground nutritionally as a whole, and now his febrile illness is further complicating matters.  She is leaning toward placing it. Will go ahead and contact [facility] team.  8.  Dysphagia.  D2 nectars-->downgraded to D1 12/13 d/t lethargy 9.  Hypertension.  Norvasc 10 mg daily, Coreg 6.25 mg twice daily, Cardura 4 mg daily, hydralazine 100 mg 3 times daily, HCTZ 12.5 mg daily, Imdur 60 mg daily.   controlled Allow some elevation for perfusion 10.  History of prostate cancer.  Continue Proscar 5 mg daily. 11.  Diabetes mellitus with hyperglycemia.  Hemoglobin A1c 6.0.    lantus to 24u qam  12/13 lantus decreased d/t poor po. May need to readjust for TF  12.  Diastolic congestive heart failure.  Monitor for any signs of fluid overload   Filed Weights   01/14/20 0600 01/15/20 1005 01/21/20 0300  Weight: 72.2 kg 73.1 kg 69.2 kg   12/12: weight down, discussed with daughter importance of him eating.   13.  History of gout.  Allopurinol 100 mg daily.  Monitor for any gout flareups 14.  Acute on chronic anemia with heme + stool.  Follow-up outpatient GI services for future endoscopy colonoscopy.  Continue Protonix 40 mg daily  Transfused 1 unit PRBC on 12/5  Hemoglobin 8.7 on 12/6  GI saw patient on acute and deferred endo/colonoscopy  unless gross, significant bleeding  12/10 Hgb sl down again to 8.3---transfuse if <7.5   -no gross blood in stool  12/13 hgb is down 7.8 ---no transfusion yet   -recheck tomorrow 15.  Hyperlipidemia.  Lipitor 16.  CAD with PCI.  Continue aspirin and Brilinta for now follow-up outpatient cardiology service.  No current plan for ischemic work-up 17.  CKD stage III.  Follow-up chemistries.  Creatinine 1.42 12/10  12/10 resuming fluids as above 18. Fever. UTI and/or aspiration pneumonia   -UA and CXR reviewed   -continue van/cefipime for now   -supportive care   Greater than 35 total minutes was spent in examination of patient, assessment of pertinent data,  formulation of a treatment plan, and in discussion with patient and/or family.    LOS: 14 days A FACE TO FACE EVALUATION WAS PERFORMED  Peter [name] 01/24/2020, 9:33 AM

## 2020-01-24 NOTE — Progress Notes (Signed)
[employer] [facility])  Received request from [facility] for [facility] liaison to meet with patient's daughter [name] to discuss goals of care and to provide education on hospice services at home. Meet with [name] who shared the events of her father's lengthy hospital stay as well as the challenging decisions she feels she is facing at this juncture.  Specifically, she is struggling with whether or not to agree to a feeding tube for her father and, if placed, for what duration of time it would be used.  Also, she shares concerns that her father's recent decline is caused by another CVA or a progression of an existing CVA and wonders if an MRI would assist the family in their decision making process.   Plan agreed on to allow the antibiotics time to work and to reassess Mr. Hanauer functional status tomorrow at the scheduled family meeting.    [facility] liaisons will continue to offer support to this patient and family as a clear path forward is revealed.      [facility] information and contact numbers given to daughter [name].  Above information shared with Harlan Stains, [place].  Please call with any questions or concerns.  Thank you for the opportunity to participate in this pt's care.  Domenic Moras, BSN, RN [employer] [phone] (24h on call)

## 2020-01-24 NOTE — Progress Notes (Addendum)
Palliative Medicine RN Note: Consult order noted for [place]. Upon chart review, I note that pt is very familiar with [facility] outpatient palliative program, and the [place] has already reached out to their liaison.  I spoke with [place]. [facility] is coming to meet with pt/family. PMT is not needed at this time, so we will not see the family in order to avoid confusion. I do recommend asking [facility] to scan a copy of his MOST Into Epic in case he is admitted in future.   Marjie Skiff Shanin Szymanowski, RN, BSN, [facility] Palliative Medicine Team 01/24/2020 2:13 PM Office [phone]

## 2020-01-24 NOTE — Progress Notes (Addendum)
Patient ID: Peter [name], male   DOB: 07-10-1930, 86 y.o.   MRN: [id number]  SW met with pt dtr and attending to confirm family conference tomorrow at 11:30am in which her sister will be present. Requests pt have an air mattress due to sacral wounds, and would like palliative care to come by and discuss possible options. SW informed assigned nurse on above. Plans for pt to get an NG tube to help with nutrition.   SW spoke with [name]/RN with [place] and Palliative Care [phone]) to discuss someone reaching out to family to discuss options. SW informed on family conference for tomorrow. Will be present for meeting. States she will not be in tomorrow, but SW can inform group tomorrow morning or meeting location.   *SW spoke with Anderson Malta to request that they come by and speak with pt dtr again today as she reported she was not sure they wanted to have NGT. Reports that she will speak with Chrislyn. SW informed medical team on daughter's concerns related to NGT. SW later received updates they received a phone call stating pt daughter was concerned, and will have Peter [name] stop by.  SW spoke with SW spoke with Melanie/Palliative [phone]) they are not needed as [facility] is involved. Also reports not likely to have someone come out to visit pt today.  Peter [name] from [facility] came by to visit patient.   Peter [name], MSW, [facility] Office: [phone] Cell: [phone] Fax: [phone]

## 2020-01-24 NOTE — Progress Notes (Signed)
Received a call from [name], reporting [name] is lethargic and she is unable to administer medication safely as it relates to Brilinta. The above was discussed with Dr Ranell Patrick, she agrees with plan of holding the Brilinta at this time. We will continue to monitor.

## 2020-01-24 NOTE — Telephone Encounter (Signed)
Called patient's family to schedule follow up. Patient stated it looks like he will be discharged towards the end of December. Declined to schedule follow up at this time as unaware of what status he will be at that time.

## 2020-01-24 NOTE — Progress Notes (Signed)
Pt is lethargic will open eyes upon entering room but goes right back to sleep nonverbal. Unable to administer medication safely. Call placed to Danella Sensing NP to update condition and inform of inability to administer Brilinta. Danella Sensing PA spoke with Dr Ranell Patrick will not administer medication tonight d/t safety concerns. Family is meeting tomorrow to discuss plan of care with Pallative Team.

## 2020-01-24 NOTE — Progress Notes (Signed)
STROKE TEAM PROGRESS NOTE   INTERVAL HISTORY No family is at the bedside. Pt lying in bed, lethargic, but opens eyes on voice, orientated to time, age and place.  No gaze palsy, able to track bilaterally.  Still has left facial droop, left hemiplegia.  Had a fever last night T-max 102.4.  UA WBC more than 50, WBC up to 17.5, put on vancomycin and cefepime.  OBJECTIVE Vitals:   01/24/20 0952 01/24/20 1435 01/24/20 1700 01/24/20 1827  BP: (!) 103/39 (!) 137/43 (!) 143/46   Pulse: (!) 54  (!) 55 (!) 58  Resp: 16     Temp: 99 F (37.2 C)  (!) 96.5 F (35.8 C)   TempSrc:      SpO2: 99%     Weight:      Height:       CBC:  Recent Labs  Lab 01/23/20 2242 01/24/20 0610  WBC 15.6* 17.5*  NEUTROABS 14.0*  --   HGB 9.2* 7.8*  HCT 29.0* 24.7*  MCV 84.3 84.3  PLT 408* 096   Basic Metabolic Panel:  Recent Labs  Lab 01/21/20 0410 01/23/20 2242  NA 140 139  K 3.9 4.0  CL 107 106  CO2 21* 22  GLUCOSE 88 225*  BUN 33* 35*  CREATININE 1.42* 1.77*  CALCIUM 8.4* 8.6*   Lipid Panel:     Component Value Date/Time   CHOL 85 12/31/2019 0318   TRIG 152 (H) 12/31/2019 0318   HDL 44 12/31/2019 0318   CHOLHDL 1.9 12/31/2019 0318   VLDL 30 12/31/2019 0318   LDLCALC 11 12/31/2019 0318   HgbA1c:  Lab Results  Component Value Date   HGBA1C 6.0 (H) 12/31/2019   Urine Drug Screen:     Component Value Date/Time   LABOPIA NONE DETECTED 12/31/2019 1806   COCAINSCRNUR NONE DETECTED 12/31/2019 1806   LABBENZ NONE DETECTED 12/31/2019 1806   AMPHETMU NONE DETECTED 12/31/2019 1806   THCU NONE DETECTED 12/31/2019 1806   LABBARB NONE DETECTED 12/31/2019 1806    Alcohol Level     Component Value Date/Time   ETH <10 12/30/2019 2315    IMAGING  CT ANGIO HEAD W OR WO CONTRAST  Result Date: 01/16/2020 CLINICAL DATA:  Stroke suspected. EXAM: CT ANGIOGRAPHY HEAD TECHNIQUE: Multidetector CT imaging of the head was performed using the standard protocol during bolus administration of  intravenous contrast. Multiplanar CT image reconstructions and MIPs were obtained to evaluate the vascular anatomy. CONTRAST:  94mL OMNIPAQUE IOHEXOL 350 MG/ML SOLN COMPARISON:  Brain MRI 01/15/2020. Prior noncontrast head CT examinations 01/12/2020 and earlier. CT angiogram head/neck 12/30/2019. FINDINGS: CT HEAD Brain: Mild cerebral atrophy. Redemonstrated acute/subacute infarcts within the right corona radiata, right frontal lobe, right insula/subinsular white matter and right basal ganglia, which appear unchanged as compared to the brain MRI performed yesterday 01/15/2020. No interval demarcated cortical infarct is identified. No evidence of hemorrhagic conversion. Stable background chronic small vessel ischemic disease. No evidence of intracranial mass. No midline shift. Vascular: Reported below. Skull: Normal. Negative for fracture or focal lesion. Sinuses: No significant paranasal sinus disease. Orbits: No mass or acute finding. CTA HEAD Anterior circulation: The intracranial internal carotid arteries are patent. Redemonstrated prominent calcified plaque throughout the intracranial right ICA with multifocal stenoses. Most notably, there is redemonstrated severe stenosis of the cavernous segment. Redemonstrated calcified plaque throughout the intracranial left ICA with no more than mild to moderate stenosis. Progressive 10 mm long near occlusive stenosis of the M1 right MCA versus segmental occlusion with  some distal reconstitution. Some enhancement is seen more distally within the M1 right MCA and right M2 vessels, although markedly diminished as compared to the left and decreased as compared to the prior CT a of 12/30/2019. The M1 left MCA is patent without significant stenosis. No left M2 proximal branch occlusion or high-grade proximal stenosis is identified. The A1 right ACA is hypoplastic with redemonstrated severe proximal stenosis. Posterior circulation: The intracranial vertebral arteries are patent.  Atherosclerotic plaque within both vessels. Moderate/severe stenosis on the right. Mild/moderate stenosis on the left. The basilar artery is patent. 1-2 mm outpouching arising from the right P1/P2 junction favored to reflect minimal residual enhancement within an otherwise occluded right posterior communicating artery, unchanged. The posterior cerebral arteries are patent. Predominantly fetal origin of the left posterior cerebral artery. Venous sinuses: Within the limitations of contrast timing, no convincing thrombus. Anatomic variants: As described Impression 1 under the CTA head sectrion below will be called to the ordering clinician or representative by the Radiologist Assistant, and communication documented in the PACS or [employer]. IMPRESSION: CT head: Acute/subacute infarcts within the right corona radiata, right frontal lobe, right insula/subinsular white matter and right basal ganglia appear unchanged as compared to the brain MRI performed yesterday. No interval demarcated cortical infarct is identified. No evidence of hemorrhagic conversion. CTA head: 1. Progressive 10 mm long near occlusive stenosis of the M1 right MCA versus now segmental occlusion with some distal reconstitution. Some enhancement is seen more distally within the M1 and M2 right MCA vessels, although markedly diminished as compared to the left and significantly decreased as compared to the prior CTA of 12/30/2019. 2. Otherwise, no significant interval change as compared to the prior CTA head of 12/30/2019 as detailed. Electronically Signed   By: Kellie Simmering DO   On: 01/16/2020 14:42   MR BRAIN WO CONTRAST  Result Date: 01/15/2020 CLINICAL DATA:  Stroke, follow-up. EXAM: MRI HEAD WITHOUT CONTRAST TECHNIQUE: Multiplanar, multiecho pulse sequences of the brain and surrounding structures were obtained without intravenous contrast. COMPARISON:  Prior head CT examinations 01/12/2020 and earlier. Brain MRI 12/31/2019. CT angiogram  head/neck 12/30/2019. FINDINGS: Brain: The examination is intermittently motion degraded. Most notably, there is moderate/severe motion degradation of the coronal T2 weighted sequence and moderate motion degradation of the sagittal T1 weighted sequence. There is persistent subtle diffusion hyperintensity at site of the known, now subacute infarcts within the right insular cortex, subinsular white matter and right frontal operculum. However, new from the prior MRI of 12/31/2019, there is an acute/early subacute infarct within the right corona radiata measuring 4.5 x 0.8 x 1.6 cm (AP x TV x CC) (for instance as seen on series 5, image 77) (series 7, image 49). New separate smaller acute/early subacute infarcts within the right corona radiata, right subinsular white matter and right lentiform nucleus. New punctate acute/early subacute infarct within the right frontal lobe motor strip (series 5, image 82). Corresponding T2/FLAIR hyperintensity at these sites. Mild multifocal T2/FLAIR hyperintensity within the cerebral white matter elsewhere is nonspecific, but compatible with chronic small vessel ischemic disease. Redemonstrated small chronic lacunar infarct within the left lentiform nucleus. No evidence of intracranial mass. No chronic intracranial blood products. No extra-axial fluid collection. No midline shift. Vascular: Known high-grade proximal M1 right MCA stenosis demonstrated on the prior CTA head/neck of 12/30/2019. Skull and upper cervical spine: No focal marrow lesion. Sinuses/Orbits: Visualized orbits show no acute finding. Trace ethmoid sinus mucosal thickening. Other: Bilateral mastoid effusions. IMPRESSION: Motion degraded examination as  described. New from the prior MRI of 12/31/2019, there is an acute/early subacute infarct within the right corona radiata measuring 4.5 x 1.6 cm. New separate smaller acute/early subacute infarcts within the right corona radiata, right subinsular white matter and right  lentiform nucleus. Additional punctate acute/early subacute infarct within the right frontal lobe motor strip. These findings are superimposed upon background known subacute right MCA infarcts. Stable background cerebral atrophy and chronic small vessel ischemic disease. Electronically Signed   By: Kellie Simmering DO   On: 01/15/2020 22:24   ECHOCARDIOGRAM COMPLETE  Result Date: 12/31/2019    ECHOCARDIOGRAM REPORT   Patient Name:   KENLY Digeronimo Date of Exam: 12/31/2019 Medical Rec #:  [id number]        Height:       67.0 in Accession #:    [id number]       Weight:       160.0 lb Date of Birth:  1930-12-29        BSA:          1.839 m Patient Age:    81 years         BP:           142/50 mmHg Patient Gender: M                HR:           69 bpm. Exam Location:  Inpatient Procedure: 2D Echo Indications:    stroke 434.91  History:        Patient has prior history of Echocardiogram examinations, most                 recent 06/27/2019. CAD, chronic kidney disease; Risk                 Factors:Hypertension and Diabetes.  Sonographer:    Johny Chess Referring Phys: [id number] ASHISH ARORA IMPRESSIONS  1. Left ventricular ejection fraction, by estimation, is 60 to 65%. The left ventricle has normal function. The left ventricle has no regional wall motion abnormalities. There is mild concentric left ventricular hypertrophy. Left ventricular diastolic parameters are consistent with Grade II diastolic dysfunction (pseudonormalization). Elevated left atrial pressure. The E/e' is 23.  2. Right ventricular systolic function is normal. The right ventricular size is normal. Tricuspid regurgitation signal is inadequate for assessing PA pressure.  3. Left atrial size was mildly dilated.  4. The mitral valve is degenerative. No evidence of mitral valve regurgitation. No evidence of mitral stenosis.  5. The aortic valve is tricuspid. Aortic valve regurgitation is not visualized. No aortic stenosis is present.  6. The inferior  vena cava is normal in size with greater than 50% respiratory variability, suggesting right atrial pressure of 3 mmHg. Comparison(s): No prior Echocardiogram. Conclusion(s)/Recommendation(s): No intracardiac source of embolism detected on this transthoracic study. A transesophageal echocardiogram is recommended to exclude cardiac source of embolism if clinically indicated. FINDINGS  Left Ventricle: Left ventricular ejection fraction, by estimation, is 60 to 65%. The left ventricle has normal function. The left ventricle has no regional wall motion abnormalities. The left ventricular internal cavity size was normal in size. There is  mild concentric left ventricular hypertrophy. Left ventricular diastolic parameters are consistent with Grade II diastolic dysfunction (pseudonormalization). Elevated left atrial pressure. The E/e' is 66. Right Ventricle: The right ventricular size is normal. No increase in right ventricular wall thickness. Right ventricular systolic function is normal. Tricuspid regurgitation signal is inadequate for assessing PA pressure. Left Atrium: Left  atrial size was mildly dilated. Right Atrium: Right atrial size was normal in size. Pericardium: Trivial pericardial effusion is present. Mitral Valve: The mitral valve is degenerative in appearance. There is mild calcification of the anterior mitral valve leaflet(s). No evidence of mitral valve regurgitation. No evidence of mitral valve stenosis. Tricuspid Valve: The tricuspid valve is grossly normal. Tricuspid valve regurgitation is trivial. No evidence of tricuspid stenosis. Aortic Valve: The aortic valve is tricuspid. Aortic valve regurgitation is not visualized. No aortic stenosis is present. Pulmonic Valve: The pulmonic valve was grossly normal. Pulmonic valve regurgitation is not visualized. No evidence of pulmonic stenosis. Aorta: The aortic root and ascending aorta are structurally normal, with no evidence of dilitation. Venous: The inferior  vena cava is normal in size with greater than 50% respiratory variability, suggesting right atrial pressure of 3 mmHg. IAS/Shunts: The atrial septum is grossly normal. Additional Comments: There is a small pleural effusion in the right lateral region.  LEFT VENTRICLE PLAX 2D LVIDd:         4.90 cm  Diastology LVIDs:         3.30 cm  LV e' medial:    4.57 cm/s LV PW:         1.10 cm  LV E/e' medial:  18.4 LV IVS:        1.30 cm  LV e' lateral:   5.98 cm/s LVOT diam:     2.20 cm  LV E/e' lateral: 14.0 LV SV:         111 LV SV Index:   60 LVOT Area:     3.80 cm  RIGHT VENTRICLE             IVC RV S prime:     15.30 cm/s  IVC diam: 1.60 cm TAPSE (M-mode): 2.4 cm LEFT ATRIUM             Index       RIGHT ATRIUM           Index LA diam:        4.20 cm 2.28 cm/m  RA Area:     13.30 cm LA Vol (A2C):   78.9 ml 42.90 ml/m RA Volume:   31.50 ml  17.13 ml/m LA Vol (A4C):   59.4 ml 32.30 ml/m LA Biplane Vol: 69.2 ml 37.62 ml/m  AORTIC VALVE LVOT Vmax:   124.00 cm/s LVOT Vmean:  82.400 cm/s LVOT VTI:    0.292 m  AORTA Ao Root diam: 3.20 cm Ao Asc diam:  3.00 cm MITRAL VALVE MV Area (PHT): 2.83 cm     SHUNTS MV Decel Time: 268 msec     Systemic VTI:  0.29 m MV E velocity: 83.90 cm/s   Systemic Diam: 2.20 cm MV A velocity: 131.00 cm/s MV E/A ratio:  0.64 Eleonore Chiquito MD Electronically signed by Eleonore Chiquito MD Signature Date/Time: 12/31/2019/11:18:26 AM    Final     PHYSICAL EXAM     Temp:  [96.5 F (35.8 C)-102.4 F (39.1 C)] 96.5 F (35.8 C) (12/13 1700) Pulse Rate:  [54-83] 58 (12/13 1827) Resp:  [16-22] 16 (12/13 0952) BP: (103-195)/(39-84) 143/46 (12/13 1700) SpO2:  [96 %-99 %] 99 % (12/13 0952)  General - Well nourished, well developed, in no apparent distress.  Ophthalmologic - fundi not visualized due to noncooperation.  Cardiovascular - Regular rhythm and rate.  Neuro - lethargic, but eyes open, follows simple commands, no aphasia but positive speech, orientated to time, place and age.  Able  to name 2/3 and repeat simple sentences.  No gaze palsy, tracking bilaterally.  Visual field fall.  PERRL.  Left facial droop, tongue protrusion to the left.  Left upper extremity 1-2/5 with muscle contraction but barely horizontal movement, right upper extremity at least 4/5.  Left lower extremity 2/5 proximal and 0/5 distally. RLE at least 3/5 proximal and distal.  Sensation seems decreased on the left upper and lower extremity.  Finger-to-nose intact around right upper extremity.  Gait not tested   ASSESSMENT/PLAN [name] is a 86 y.o. male with history of coronary artery disease, CKD stage III, dementia, diabetes, diastolic dysfunction, peripheral vascular disease, prostate cancer, history of multiple syncopal episodes, presented to the emergency room for evaluation of sudden onset of slurred speech, difficulty swallowing and left-sided weakness. He was scheduled for a GI scoping today, but could not go ahead with the procedure because of severely elevated blood pressures with systolic in the 403K.  His Plavix was on hold for procedure. The patient received IV t-PA Friday 12/31/19 at [facility].  Stroke: left MCA multifocal infarcts, likely due to progressive right MCA occlusion  01/12/2020 CT head new rounded low-density is in the right periventricular white matter consistent with acute infarctions  01/15/2020 MRI new infarctions at right corona radiata, right subinsular white matter and right BG, additional punctate acute/early subacute infarct within the right frontal lobe motor strip  12/5 CTA head new progressive right M1 near occlusion with some distal reconstitution, markedly diminishes compared to prior CTA  Etiology for right M1 progressive occlusion could be due to fluctuation of blood pressure, progressive anemia, and infection  Now on ASA and brilinta DAPT for 3 months, then brillinta alone.    Therapy recommendations:  CIR vs. SNF  Disposition:  Pending  Given pt worsening  neuro condition significant disability, complicated by UTI, fever, sepsis, progressive anemia, dysphagia, AKI on CKD, I had long discussion with daughter [name] over the phone. She is willing to discuss with palliative care tomorrow as scheduled and to consider home hospice   History of stroke  12/30/2019 admitted for acute right MCA pachy infarcts s/p tPA, etiology unclear, cardioembolic vs. Large vessel disease source.   12/30/2019 CT Head - No acute intracranial infarct or other abnormality.   12/31/2019 CTA H&N - Approximate 8-9 mm severe near occlusive stenosis of the proximal right M1 segment. Right MCA branches attenuated but patent distally with no visible downstream occlusion. Additional severe proximal right A1 stenosis. ICA stenosis up to 50% on the right and 45% on the left.   12/31/2019 MRI head - Patchy and hazy diffusion abnormality involving the right insular cortex, subinsular white matter, and overlying right frontal operculum, consistent with acute right MCA  2D Echo - EF 60 - 65%. No cardiac source of emboli identified. territory ischemia.   Was recommended to consider 30 day cardiac event monitoring as outpt to rule out afib  [employer] Virus 2 - negative  LDL - 11  HgbA1c - 6.0  UDS neg  VTE prophylaxis - SCDs  Discharged to CIR on 01/10/20 with DAPT ASA and brilinta.  Leukocytosis Fever UTI  WBC 8.4->15.6->17.5  T-max 102.4  UA WBC > 50  CXR New patchy opacities in both infrahilar regions, may be atelectasis or pneumonia. Aspiration is also considered.  Now on vancomycin and cefepime  Anemia of chronic disease  Hx of GIB  Hgb - 8.3->9.2->7.8  FOBT positive 01/09/20  Had PRBC transfusion 11/28 and 12/5  GI  consulted no procedure needed inpt.  CBC monitoring  Dysphagia  Aspiration risk  Speech on board   CXR New patchy opacities in both infrahilar regions, may be atelectasis or pneumonia. Aspiration is also considered.  On dys1 and  nectar thick  AKI on CKD  Creatinine 1.34->1.42->1.77  Put on NS @ 50  BMP monitoring  Hypertensive Urgency Bradycardia on BB  Home BP meds: Cozaar ; amlodipine ; HCTZ ; Coreg ; hydralazine ; Cardura  BP on the low side from time to time  on Coreg 6.25mg  bid; Norvasc 10mg  daily ; imdur 60mg  daily; Cardura 4mg  daily; and hydralazine 100mg  Tid  Avoid low BP . Long-term BP goal 130-150 given right M1 occlusion  Hyperlipidemia  Home Lipid lowering medication: Lipitor 80 mg daily  LDL 11, goal < 70  Current lipid lowering medication: decrease to lipitor 40   Continue statin at discharge  Diabetes  on lantus  HgbA1c 6.0, goal < 7.0  Controlled  CBG monitoring  Follow up with PCP  Other Stroke Risk Factors  Advanced age  Former cigarette smoker - quit  ETOH use, advised to drink no more than 1 alcoholic beverage per day.  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   Hospital day # 14  I spent  35 minutes in total face-to-face time with the patient, more than 50% of which was spent in counseling and coordination of care, reviewing test results, images and medication, and discussing the diagnosis, treatment plan and potential prognosis. This patient's care requiresreview of multiple databases, neurological assessment, discussion with family, other specialists and medical decision making of high complexity. I had long discussion with daughter [name] over the phone, updated pt current condition, treatment plan and potential prognosis, and answered all the questions. She expressed understanding and appreciation.  She is willing to discuss with palliative care tomorrow as scheduled and to consider home hospice  Rosalin Hawking, MD PhD Stroke Neurology 01/24/2020 6:53 PM    To contact Stroke  Continuity provider, please refer to [email or web address] After hours, contact General Neurology

## 2020-01-24 NOTE — Progress Notes (Signed)
Physical Therapy Session Note  Patient Details  Name: Peter [name] MRN: [id number] Date of Birth: Jun 14, 1930  Today's Date: 01/24/2020 PT Individual Time: 1440-1525 PT Individual Time Calculation (min): 45 min   Short Term Goals: Week 2:  PT Short Term Goal 1 (Week 2): Patient will perform bed mobility with max A of 1 person consistently. PT Short Term Goal 2 (Week 2): Patient will perform basic transfers with max A of 1 person consistently. PT Short Term Goal 3 (Week 2): Patient will tolerate sitting in TIS w/c >2 hours per day. PT Short Term Goal 4 (Week 2): Patient will progress with ambulation >50 feet with max A +2.  Skilled Therapeutic Interventions/Progress Updates:     Patient in bed with his daughter and palliative care nurse in the room upon PT arrival. Patient lethargic with intermittent arousal throughout session and agreeable to PT session. Patient denied pain during session.  [name] and the palliative nurse exited the room to continue discussion about patient's care.   Compared to last week when this therapist last saw the patient, noted decreased L upper and lower extremity strength, no movement or muscle activation throughout session, and patient required increased assist of max A +2 for bed mobility this session. Patient with increased difficulty taking 3 sips of thickened water provided with teaspoon. Unable to motor plan wrapping his lips around the spoon and required heavy cues to swallowing small amounts of the thickened liquid. All observations representative of functional decline since last week. RN made aware and stated that Neurology was also aware.   [name] aware of changes in patient's functional status and discussed with PT upon returning to the room.   Therapeutic Activity: Bed Mobility: Patient performed supine to/from sit with max A +2. Provided verbal cues for initiation and sequencing. Air mattress on max inflate for bed mobility  and sitting balance. Patient sat EOB x8 min with max-mod A for sitting balance focused on arousal, strong cough and sputum production (provided suctioning for expulsion of mucus), and head and trunk control in sitting. Patient with increased L and posterior lean from last week, able to self-correct intermittently. Played Laqueta Linden during for improved arousal and patient participation in activity.  Decided to not transfer patient to TIS w/c today due to decreased arousal and decline in functional status.  Patient returned to supine, as above, and performed scooting up in the bed with total A +2 with bed in trendelenburg.   Patient in bed at end of session with breaks locked, bed alarm set, and all needs within reach.    Therapy Documentation Precautions:  Precautions Precautions: Fall Precaution Comments: hx syncope, L lean, hx dementia Restrictions Weight Bearing Restrictions: No General: PT Amount of Missed Time (min): 15 Minutes PT Missed Treatment Reason: Patient fatigue   Therapy/Group: Individual Therapy  Kanen Mottola L Gabriana Wilmott PT, DPT  01/24/2020, 5:17 PM

## 2020-01-24 NOTE — Progress Notes (Signed)
Pharmacy Antibiotic Note  Peter [name] is a 86 y.o. male with fever.  Pharmacy has been consulted for cefepime and vancomycin dosing.  SCr up to 1.77, CrCL 28 ml/min, now afebrile, WBC up to 17.5.  Plan: Reduce vanc to 750mg  IV Q24H Change cefepime to 2gm IV Q24H Monitor renal fxn, clinical progress, vanc trough as indicated   Height: 5\' 7"  (170.2 cm) Weight: 69.2 kg (152 lb 8.9 oz) IBW/kg (Calculated) : 66.1  Temp (24hrs), Avg:99.4 F (37.4 C), Min:97.9 F (36.6 C), Max:102.4 F (39.1 C)  Recent Labs  Lab 01/21/20 0410 01/23/20 2242 01/24/20 0610  WBC 8.4 15.6* 17.5*  CREATININE 1.42* 1.77*  --     Estimated Creatinine Clearance: 27.5 mL/min (A) (by C-G formula based on SCr of 1.77 mg/dL (H)).    Allergies  Allergen Reactions  . Penicillins Anaphylaxis    Reported airway compromise 50 years   Vanc 12/12 >> Cefepime 12/12 >>  12/12 UCx -  12/12 BCx -   Saara Kijowski D. Mina Marble, PharmD, BCPS, [place] 01/24/2020, 11:28 AM

## 2020-01-24 NOTE — Progress Notes (Signed)
Speech Language Pathology Weekly Progress and Session Note  Patient Details  Name: Peter [name] MRN: [id number] Date of Birth: 1930-04-12  Beginning of progress report period: January 17, 2020 End of progress report period: January 24, 2020  Today's Date: 01/24/2020 SLP Individual Time: 0910-1005 SLP Individual Time Calculation (min): 55 min  Short Term Goals: Week 2: SLP Short Term Goal 1 (Week 2): Pt will demonstrate use of speech intelligibility skills at the phrase level with 90% accuarcy with min A verbal cues. SLP Short Term Goal 1 - Progress (Week 2): Not met SLP Short Term Goal 2 (Week 2): Pt will recall daily and novel events with min A verbal cues for use of external aids. SLP Short Term Goal 2 - Progress (Week 2): Not met SLP Short Term Goal 3 (Week 2): Pt will demonstrate sustained attention to functional tasks for 15 minutes with Min verbal cues for redirection. SLP Short Term Goal 3 - Progress (Week 2): Not met SLP Short Term Goal 4 (Week 2): Patient will consume current diet with minimal overt s/s of aspiration and Min verbal cues for use of swallowing compensatory strategies. SLP Short Term Goal 4 - Progress (Week 2): Not met SLP Short Term Goal 5 (Week 2): Patient will consume trials of thin liquids with minimal overt s/s of aspiration over 2 sessions to assess readiness for repeat MBS. SLP Short Term Goal 5 - Progress (Week 2): Not met SLP Short Term Goal 6 (Week 2): Patient will demonstrate effficient mastication and complete oral clerance of Dys. 3 textures  without overt s/s of aspiration over 2 sessions with Min verbal cues prior to upgrade. SLP Short Term Goal 6 - Progress (Week 2): Not met    New Short Term Goals: Week 3: SLP Short Term Goal 1 (Week 3): Patient will consume current diet with minimal overt s/s of aspiration and mod A verbal cues for use of swallowing compensatory strategies (initate swallow, reduce oral holding, anterior spillage...etc.) SLP  Short Term Goal 2 (Week 3): Pt will demonstrate sustained attention to functional tasks for 10 minutes with Mod A verbal cues for redirection. SLP Short Term Goal 3 (Week 3): Pt will recall daily and novel events with mod A verbal cues for use of external aids. SLP Short Term Goal 4 (Week 3): Pt will demonstrate use of speech intelligibility skills at the phrase level with 60% accuarcy with mod A verbal cues.  Weekly Progress Updates: Pt made very poor progress this reporting period meeting 0 out 6 goals, due to medical decline (possible UTI and pneumonia) and continued lethargy. The beginning of the reporting period pt demonstrated progress with minimal s/s aspiration with thin trials and MBS was planned for 12/13, however it was canceled due to medical decline/ethargy. SLP downgraded diet to dys 1 textures and nectar thick liquids via TSP, due to severe oral holding and novel right head tilt/posterior lean. SLP communicated with MD that alternative means of nutrition needs to be considered if improvement is not seen. SLP will wait to downgrade LTG, in hopes of improvement follow treatment for the above listed illnesses. Family education is on going. Pt would benefit from skilled ST services in order to maximize functional independence and reduce burden of care, requiring supervision at discharge with continued skilled ST services.      Intensity: Minumum of 1-2 x/day, 30 to 90 minutes Frequency: 3 to 5 out of 7 days Duration/Length of Stay: 12/28 Treatment/Interventions: Dysphagia/aspiration precaution training;Cognitive remediation/compensation;Cueing hierarchy;Functional tasks;Patient/family education;Internal/external aids;Speech/Language facilitation;Environmental  controls   Daily Session  Skilled Therapeutic Interventions: Skilled ST services focused on education and swallow skills. MBS was planned for tomorrow, however d/c due to lethargy, inability to arouse pt at 8:05 am. Pt required max  multimodal cues to become awake and demonstrated novel right head tilt (favoriting the right side) with posterior lean and exacerbated dysarthria compared to Saturday's ST session. Although it should be noted Saturday's ST session was very limited by lethargy, therefore minimal trials were attempted. Pt was unable to correct right lean with max A verbal cues and with mirror placed in front of him. SLP completed oral care with suction toothbrush and provided thin liquid via TSP trials, which immediate spilled out of right side of oral cavity. SLP held pt's head at a neutral position and pt was able to consume 1/4 of TSP which resulted in an immediate cough. Pt's daughter entered in room. Anterior head lean was corrected with more success after propping pt up with more pillows. SLP provided nectar thick liquid via TSP in which he demonstrated x1 delayed cough out of 7 trials. As the trials continued pt demonstrated extensive oral holding with consumption of puree textures 1/2 TSP (up to 1 minute) required max multimodal cues to initiate swallow, appeared due to inattention. During this period of time pt was unresponsive to verbal commands. SLP notified NT to assesses vitals. SLP provided education to daughter, Peter [name] how to safely provided TSP of nectar thick liquids, 1/2 TSP of puree, proper positioning and only during periods of alertness. Loraine verbalized swallow precautions and demonstrated cues to initiate swallow with oral holding (bring spoon to oral cavity, verbal and tactile cues.) SLP signed daughter off to assist with PO intake due to NT noting minimal intake and to support hydration. SLP downgraded deit to dys 1 textures and nectar thick liquids via TSP, notifying MD and nurse of decline in safe swallow function. Pt was left in room with daughter, call bell within reach and bed alarm set. SLP recommends to continue skilled services.  General    Pain Pain Assessment Pain Score: 0-No  pain  Therapy/Group: Individual Therapy  Peter [name]  [facility] 01/24/2020, 12:26 PM

## 2020-01-24 NOTE — Progress Notes (Signed)
Nutrition Follow-up  DOCUMENTATION CODES:   Non-severe (moderate) malnutrition in context of chronic illness  INTERVENTION:   If Cortrak placement and initiation of tube feeds is within [place], recommend: - Osmolite 1.5 @ 65 ml/hr to run over 20 hours (total of 1300 ml, tube feeds can be held for up to 4 hours for therapies) - ProSource TF 45 ml BID  Next available Cortrak date is 12/15.  Recommended tube feeding regimen would provide 2030 kcal, 104 grams of protein, and 991 ml of H2O.   -Continue Magic Cup TID with meals, each supplement provides 290 kcal and 9 grams of protein  -Continue Mighty Shake BID, each supplement provides 330 kcal and 9 grams of protein  - Vanilla Greek yogurt TID with meals  - Continue to encourage adequate PO intake and provide feeding assistance with meals  NUTRITION DIAGNOSIS:   Severe Malnutrition related to chronic illness (dementia, CHF, prostate cancer) as evidenced by moderate fat depletion,severe muscle depletion,percent weight loss (10.4% weight loss in 6 months).  Ongoing  GOAL:   Patient will meet greater than or equal to 90% of their needs  Unmet at this time  MONITOR:   PO intake,Supplement acceptance,Diet advancement,Labs,Weight trends,Skin,I & O's  REASON FOR ASSESSMENT:   Consult Enteral/tube feeding initiation and management  ASSESSMENT:   86 year old male with PMH of CAD/PCI, CKD stage III, dementia, DM, CHF, HTN, HLD, PVD, prostate cancer. Presented 12/30/19 with left-sided weakness and slurred speech. Pt found to have acute right MCA stroke. Admitted to CIR on 11/29.  RD received consult for tube feeding initiation and management. Cortrak order has been placed. Cortrak RD went to pt's room to place tube today and pt's daughter requesting to speak with palliative care prior to having Cortrak tube placed. Noted family meeting scheduled for 12/15. Next available Cortrak date will be 12/15. RD will leave tube feeding  recommendations.  Diet has been downgraded to dysphagia 1 with nectar-thick liquids today due to lethargy. Per MD notes, lethargy likely due to febrile illness and fever related to UTI and/or aspiration pneumonia.  Per nursing documentation, pt has been refusing several meals despite RN and NT encouragement. Multiple meal completions recorded as 0%. RD initially recommend Cortrak NG tube placement and initiation of enteral nutrition on 12/07. Discussed recommendation for Cortrak with MD on same date and recommended palliative care consult regarding [place]. MD wanted to hold off on Cortrak at that time and was considering trial of appetite stimulant.  Spoke with pt's daughter at bedside. Pt's daughter attempting to feed pt spoonfuls of Mighty Shake supplements from pt's meal trays. Pt having difficulty closing lips around spoon and spillage of liquid noted. Unsure whether pt was actually swallowing.  Pt's daughter expresses frustrating regarding pt's PO intake. She states that she was told to make a list of pt's favorite foods but that it does not seem like anyone has done anything about this. Noted list taped to bottom of whiteboard in pt's room. RD was unaware that this list existed. Unfortunately, with pt's restricted diet order, several of the options on the list are not possible. RD did order grits, cream of wheat (in place of oatmeal), pudding, and vanilla yogurt to come with meals in addition to previously ordered supplements.  Pt's daughter asking what foods she can bring in. Explained pt's current diet order is dysphagia 1 with nectar-thick liquids and what foods would fit into that diet order. Pt's daughter specifically asking about milkshakes. Explained that milkshake would need to be  thickened to appropriate consistency.  MD ordered Ensure Enlive yesterday. RD to modify order to add instructions so that it will be thickened to appropriate consistency.  RD will follow up for results of [place]  discussions.  11/19 CIR admit weight: 71.8 kg 12/10 weight: 69.2 kg  Meal Completion: 0-25% x last 8 documented meals  Medications reviewed and include: SSI, lantus 15 units daily, ritalin, remeron, protonix, klor-con, IV abx  Labs reviewed: BUN 35, creatinine 1.77, hemoglobin 7.8 CBG's: 90-191 x 24 hours  Diet Order:   Diet Order            DIET - DYS 1 Room service appropriate? Yes; Fluid consistency: Nectar Thick  Diet effective now                 EDUCATION NEEDS:   Not appropriate for education at this time  Skin:  Skin Assessment: Skin Integrity Issues: Diabetic Ulcer: right middle toe Other: MASD to medial sacrum  Last BM:  01/23/20  Height:   Ht Readings from Last 1 Encounters:  01/10/20 5\' 7"  (1.702 m)    Weight:   Wt Readings from Last 1 Encounters:  01/21/20 69.2 kg    Ideal Body Weight:     BMI:  Body mass index is 23.89 kg/m.  Estimated Nutritional Needs:   Kcal:  1900-2100  Protein:  100-115 grams  Fluid:  2.0 L/day    Gustavus Bryant, MS, RD, LDN Inpatient Clinical Dietitian Please see [facility] for contact information.

## 2020-01-24 NOTE — Progress Notes (Signed)
Occupational Therapy Session Note  Patient Details  Name: Peter [name] MRN: [id number] Date of Birth: February 21, 1930  Today's Date: 01/24/2020 OT Individual Time: 1050-1119 OT Individual Time Calculation (min): 29 min   Short Term Goals: Week 2:  OT Short Term Goal 1 (Week 2): Pt will complete UB bathing EOB with mod A OT Short Term Goal 2 (Week 2): Pt will complete transfer to [facility] with max A +1 OT Short Term Goal 3 (Week 2): Pt will increase arousal and alertness for participation in 3 consecutive sessions    Skilled Therapeutic Interventions/Progress Updates:    Pt greeted in bed, asleep, difficult to rouse with sternal rub. Max cuing to keep eyes open and noted that pt kept one eye closed, intermittently falling asleep during tx and audibly snoring. Worked on edema mgt of the Lt UE via PROM and gentle retrograde massage. Pt needing max cuing to visually attending to his Lt arm, unable to squeeze his Lt hand or assist therapist with limb movement of the affected arm. At end of session pts Lt arm was elevated above his heart. Left him with all needs within reach and bed alarm set.   Therapy Documentation Precautions:  Precautions Precautions: Fall Precaution Comments: hx syncope, L lean, hx dementia Restrictions Weight Bearing Restrictions: No Vital Signs: Therapy Vitals BP: (!) 137/43 Pain: Pain Assessment Pain Score: 0-No pain ADL: ADL Upper Body Dressing: Maximal assistance Lower Body Dressing: Dependent Where Assessed-Lower Body Dressing: Edge of bed Toileting: Dependent Where Assessed-Toileting: Bed level     Therapy/Group: Individual Therapy  Duard Spiewak A Darrly Loberg 01/24/2020, 4:08 PM

## 2020-01-24 NOTE — Progress Notes (Signed)
Cortrak Tube Team Note:  Consult received to place a Cortrak feeding tube.   Met with pt and pt's daughter in room today. Daughter is requesting to speak with palliative care prior to having Cortrak tube placed. Family meeting scheduled for 12/14. Can plan on Cortrak tube placement on Weds, 12/15 pending GOC.   Koleen Distance MS, RD, LDN Please refer to [facility] for RD and/or RD on-call/weekend/after hours pager

## 2020-01-24 NOTE — Progress Notes (Signed)
Occupational Therapy Session Note  Patient Details  Name: Peter [name] MRN: [id number] Date of Birth: [birth year]  Today's Date: 01/24/2020 OT Individual Time: 1300-1400 OT Individual Time Calculation (min): 60 min    Short Term Goals: Week 2:  OT Short Term Goal 1 (Week 2): Pt will complete UB bathing EOB with mod A OT Short Term Goal 2 (Week 2): Pt will complete transfer to [facility] with max A +1 OT Short Term Goal 3 (Week 2): Pt will increase arousal and alertness for participation in 3 consecutive sessions  Skilled Therapeutic Interventions/Progress Updates:    Pt received supine with daughter [name] present and dietician in room discussing cortak placement. Pt lethargic, R head turn and gaze when he does open his eyes. L Hemi more pronounced compared to previous sessions. PA/MD made aware. Multimodal cueing used to attempt and arouse pt. He was able to open his eyes and briefly attend to OT or his daughter several times. He requested to try some of his lunch and was given a very small bite of magic cup. He was unable to manage orally and had productive cough, requiring suction to remove liquid. He requested to brush his teeth and was able to complete after tactile cueing. Cueing required for termination of task, as well as frequent suction for oral secretions. Pt was boosted up in bed to ensure safety and positioning in bed, and HOB set to 30 degrees. Pt's daughter [name] played preferred music throughout session to increase arousal, which was mostly successful and pt conversed several times re music/artists. Pt was left supine with all needs met. Family meeting tomorrow will be held to discuss POC moving forward.   Therapy Documentation Precautions:  Precautions Precautions: Fall Precaution Comments: hx syncope, L lean, hx dementia Restrictions Weight Bearing Restrictions: No   Therapy/Group: Individual Therapy  Curtis Sites 01/24/2020, 6:58 AM

## 2020-01-25 ENCOUNTER — Inpatient Hospital Stay (HOSPITAL_COMMUNITY): Payer: Medicare Other | Admitting: Speech Pathology

## 2020-01-25 ENCOUNTER — Inpatient Hospital Stay (HOSPITAL_COMMUNITY): Payer: Medicare Other

## 2020-01-25 DIAGNOSIS — I739 Peripheral vascular disease, unspecified: Secondary | ICD-10-CM | POA: Diagnosis not present

## 2020-01-25 DIAGNOSIS — E43 Unspecified severe protein-calorie malnutrition: Secondary | ICD-10-CM

## 2020-01-25 LAB — CBC
HCT: 23.3 % — ABNORMAL LOW (ref 39.0–52.0)
Hemoglobin: 7.7 g/dL — ABNORMAL LOW (ref 13.0–17.0)
MCH: 27.7 pg (ref 26.0–34.0)
MCHC: 33 g/dL (ref 30.0–36.0)
MCV: 83.8 fL (ref 80.0–100.0)
Platelets: 339 10*3/uL (ref 150–400)
RBC: 2.78 MIL/uL — ABNORMAL LOW (ref 4.22–5.81)
RDW: 15.9 % — ABNORMAL HIGH (ref 11.5–15.5)
WBC: 11.9 10*3/uL — ABNORMAL HIGH (ref 4.0–10.5)
nRBC: 0 % (ref 0.0–0.2)

## 2020-01-25 LAB — BASIC METABOLIC PANEL
Anion gap: 10 (ref 5–15)
BUN: 39 mg/dL — ABNORMAL HIGH (ref 8–23)
CO2: 21 mmol/L — ABNORMAL LOW (ref 22–32)
Calcium: 8.3 mg/dL — ABNORMAL LOW (ref 8.9–10.3)
Chloride: 110 mmol/L (ref 98–111)
Creatinine, Ser: 1.73 mg/dL — ABNORMAL HIGH (ref 0.61–1.24)
GFR, Estimated: 38 mL/min — ABNORMAL LOW (ref >60)
Glucose, Bld: 120 mg/dL — ABNORMAL HIGH (ref 70–99)
Potassium: 3.7 mmol/L (ref 3.5–5.1)
Sodium: 141 mmol/L (ref 135–145)

## 2020-01-25 LAB — GLUCOSE, CAPILLARY
Glucose-Capillary: 105 mg/dL — ABNORMAL HIGH (ref 70–99)
Glucose-Capillary: 88 mg/dL (ref 70–99)
Glucose-Capillary: 147 mg/dL — ABNORMAL HIGH (ref 70–99)
Glucose-Capillary: 175 mg/dL — ABNORMAL HIGH (ref 70–99)

## 2020-01-25 LAB — URINE CULTURE: Culture: >=100000 — AB

## 2020-01-25 NOTE — Progress Notes (Signed)
Manufacturing engineer [facility])  Attended family meeting with daughters [name] and [name], initially with staff from [place] and then with only family.  Family is still looking for clarity for their goals of care with their father.  Discussion had about DME available through [place] and what hospice support in the home would look like. Deferred questions about DME cost/resources available with home health to [place], TOC.  Discussion had of pros and cons of bringing patient home before Christmas vs. Discharging home after full course of rehab.   ACC information and contact numbers given to [facility], daughter.  Above information shared with Harlan Stains, [place].  Please call with any questions or concerns.  Thank you for the opportunity to participate in this pt's care.  Domenic Moras, BSN, RN [employer] [phone]

## 2020-01-25 NOTE — Progress Notes (Signed)
Physical Therapy Session Note  Patient Details  Name: Peter [name] MRN: [id number] Date of Birth: Oct 14, 1930  Today's Date: 01/25/2020 PT Individual Time: 1315-1330 and 1430-1530 PT Individual Time Calculation (min): 15 min and 60 min   Short Term Goals: Week 1:  PT Short Term Goal 1 (Week 1): Pt will ambulate 29ft w/RW and mod assist PT Short Term Goal 1 - Progress (Week 1): Not progressing PT Short Term Goal 2 (Week 1): Pt will transfer bed to/from wc w/RW and mod assist PT Short Term Goal 2 - Progress (Week 1): Progressing toward goal PT Short Term Goal 3 (Week 1): Pt will maintain static sitting balance on edge of bed w/min assist PT Short Term Goal 3 - Progress (Week 1): Progressing toward goal  Skilled Therapeutic Interventions/Progress Updates:     Session 1: Patient in TIS w/c upon PT arrival. Patient alert and agreeable to PT session. Patient reported mild/moderate back pain during session, RN made aware. PT provided repositioning, rest breaks, and distraction as pain interventions throughout session.  Patient with untouched lunch tray in the room. Patient declined any items of his main dish, but agreeable to try pureed peaches and magic cup. Patient did not like the peaches after the first bite, but ate 6 bites of the magic cup with total A for feeding for management of bite size. Patient with improved oral motor function with feeding compared to yesterday. Provided cues for swallowing and performing throat clearing. Patient's daughters arrived as patient was eating. They had brought pureed food from home. Patient tolerated eating home food with his daughters assisting. Decided to return at a later time to see patient following eating lunch with his daughters for improved nutritional intake. Patient in [place] w/c with his daughters in the room with breaks locked, seat belt alarm in place, and all needs in reach.  Session 2: Patient in TIS w/c with his daughters in the room and  finished with lunch upon PT arrival. Patient alert and agreeable to PT session. Patient indicated R foot and back pain during session, RN made aware. PT provided repositioning, rest breaks, and distraction as pain interventions throughout session.   Changed R toe dressing during session, RN made aware. Allowed patient's daughter to view and photograph the patient's toe for comparison from previous observations. Applied iodine swab and kerlix to dress patient's toe. His second digit on his R foot continues to present with ischemic changes, is black in color, with minimal drainage on previous dressing. Noted mild skin separation at the base of the toe as source of the drainage.   Therapeutic Activity: Bed Mobility: Patient performed sit to supine with max A +2 for trunk and lower extremity management due to fatigue. He performed rolling R/L with max A to doff/don pants and change incontinence brief with total A. Performed peri-care for hygiene and due to small BM smear on brief with total A.  Transfers: Patient performed a [employer] transfer TIS w/c>bed with max A +2 for safety and total A for board placement. Provided cues for hand placement, board placement, and head-hips relationship for proper technique and decreased assist with transfers.   [name] participated in hands on training as +2 assist for all mobility during session. Provided min cues for assist technique.  Patient in bed with his daughter at bedside at end of session with breaks locked and all needs within reach.    Therapy Documentation Precautions:  Precautions Precautions: Fall Precaution Comments: hx syncope, L lean, hx dementia  Restrictions Weight Bearing Restrictions: No   Therapy/Group: Individual Therapy  Dalia Jollie L Arleen Bar PT, DPT  01/25/2020, 10:13 PM

## 2020-01-25 NOTE — Progress Notes (Signed)
Occupational Therapy Weekly Progress Note  Patient Details  Name: Nabor Thomann MRN: [id number] Date of Birth: 1930/12/13  Beginning of progress report period: January 18, 2020 End of progress report period: January 25, 2020  Today's Date: 01/25/2020 OT Individual Time: 4128-7867 OT Individual Time Calculation (min): 70 min   Patient has met 0 of 3 short term goals.  Pt has had a significant functional decline this reporting period. A family meeting has been held to address goals of care and the team is moving forward to continue progressing toward mod A   Patient continues to demonstrate the following deficits: muscle weakness, unbalanced muscle activation, decreased coordination and decreased motor planning, decreased attention to left, decreased initiation, decreased attention, decreased awareness, decreased problem solving, decreased safety awareness, decreased memory and delayed processing and decreased sitting balance, decreased standing balance, decreased postural control, hemiplegia and decreased balance strategies and therefore will continue to benefit from skilled OT intervention to enhance overall performance with BADL and Reduce care partner burden.  Patient not progressing toward long term goals. The team has agreed to continue monitoring pt progress this week and to adjust goals accordingly.   OT Short Term Goals Week 2:  OT Short Term Goal 1 (Week 2): Pt will complete UB bathing EOB with mod A OT Short Term Goal 1 - Progress (Week 2): Not met OT Short Term Goal 2 (Week 2): Pt will complete transfer to [facility] with max A +1 OT Short Term Goal 2 - Progress (Week 2): Not met OT Short Term Goal 3 (Week 2): Pt will increase arousal and alertness for participation in 3 consecutive sessions OT Short Term Goal 3 - Progress (Week 2): Not met Week 3:  OT Short Term Goal 1 (Week 3): Pt will attend to his LUE with mod cueing OT Short Term Goal 2 (Week 3): Pt will maintain static sitting  balance with mod A OT Short Term Goal 3 (Week 3): Pt will complete UB bathing with mod A OT Short Term Goal 4 (Week 3): Pt will maintain appropriate arousal for 3 consecutive ADL sessions  Skilled Therapeutic Interventions/Progress Updates:    Pt received supine, much more alert than yesterday. Pt with somewhat appropriate conversation with OT, however still with some confabulation. Pt very frustrated over not being able to drink "real" water. He was eventually agreeable to taking sips of nectar thick water via tsp. No overt s/s of aspiration with nectar thick water. Pt rolled R and L with heavy max A for dependent brief change and donning of pants. Pt incontinent of urine. +2 assistance was obtained and pt was transferred from supine to sitting EOB with max +2. Slightly improved L pushing/lean in sitting, still requiring max A for sitting balance. Pt completed beasy board transfer from bed > TIS w/c with total +2 assist. Pillows and lateral support used to support pt in midline. Pt eager to perform oral care and he did so with set up assist and cueing for termination. BP obtained seated- 126/39. Reported this to RN to ensure appropriateness for sitting up. Pt was taken to the nurses desk to sit up with supervision to ensure safety.   Therapy Documentation Precautions:  Precautions Precautions: Fall Precaution Comments: hx syncope, L lean, hx dementia Restrictions Weight Bearing Restrictions: No   Therapy/Group: Individual Therapy  Curtis Sites 01/25/2020, 12:44 PM

## 2020-01-25 NOTE — Progress Notes (Signed)
PHYSICAL MEDICINE & REHABILITATION PROGRESS NOTE   Subjective/Complaints: Up in bed with telephone. Much more alert. Complaining about nectar liquids and food. Feels up for doing therapy today. Has low back pain  ROS: Limited due to cognitive/behavioral    Objective:   DG CHEST PORT 1 VIEW  Result Date: 01/23/2020 CLINICAL DATA:  Fever. EXAM: PORTABLE CHEST 1 VIEW COMPARISON:  01/17/2020 FINDINGS: Anti lordotic positioning with patient's chin obscuring the apices. Implanted loop recorder projects over the left chest wall. There are new patchy opacities in both infrahilar regions. Stable heart size and mediastinal contours. No pulmonary edema, pleural effusion or evidence of pneumothorax. Thoracic spondylosis. IMPRESSION: New patchy opacities in both infrahilar regions, may be atelectasis or pneumonia. Aspiration is also considered. Electronically Signed   By: Keith Rake M.D.   On: 01/23/2020 23:40   Recent Labs    01/24/20 0610 01/25/20 0456  WBC 17.5* 11.9*  HGB 7.8* 7.7*  HCT 24.7* 23.3*  PLT 338 339   Recent Labs    01/23/20 2242 01/25/20 0456  NA 139 141  K 4.0 3.7  CL 106 110  CO2 22 21*  GLUCOSE 225* 120*  BUN 35* 39*  CREATININE 1.77* 1.73*  CALCIUM 8.6* 8.3*    Intake/Output Summary (Last 24 hours) at 01/25/2020 0919 Last data filed at 01/25/2020 2297 Gross per 24 hour  Intake 727.02 ml  Output --  Net 727.02 ml    Physical Exam: Vital Signs Blood pressure (!) 151/45, pulse 62, temperature 98.4 F (36.9 C), resp. rate 16, height 5\' 7"  (1.702 m), weight 69.2 kg, SpO2 97 %. Constitutional: No distress  Vital signs reviewed. HEENT: EOMI, oral membranes moist Neck: supple Cardiovascular: RRR without murmur. No JVD    Respiratory/Chest: CTA Bilaterally without wheezes or rales. Normal effort    GI/Abdomen: BS +, non-tender, non-distended Ext: no clubbing, cyanosis, or edema Psych: pleasant, cooperative, kidding around Skin: Warm ?mild  redness at sacrum. Necrotic right 2nd toe without change Musc: No edema in extremities.  No tenderness in extremities. Intrinsic muscle atrophy bilateral hands Neuro: much more alert. Speech clearer. Motor: RUE/RLE: 4-4+ /5 proximal distal LUE: 1+ to 2/5 proximal--distal LLE: 1 to 2/5 proximal to distal  Assessment/Plan: 1. Functional deficits which require 3+ hours per day of interdisciplinary therapy in a comprehensive inpatient rehab setting.  Physiatrist is providing close team supervision and 24 hour management of active medical problems listed below.  Physiatrist and rehab team continue to assess barriers to discharge/monitor patient progress toward functional and medical goals  Care Tool:  Bathing    Body parts bathed by patient: Right arm,Left arm,Chest,Abdomen,Front perineal area,Right upper leg,Left upper leg,Face   Body parts bathed by helper: Buttocks,Right lower leg,Left lower leg     Bathing assist Assist Level: Maximal Assistance - Patient 24 - 49%     Upper Body Dressing/Undressing Upper body dressing   What is the patient wearing?: Pull over shirt    Upper body assist Assist Level: Maximal Assistance - Patient 25 - 49% (sitting EOB)    Lower Body Dressing/Undressing Lower body dressing      What is the patient wearing?: Pants     Lower body assist Assist for lower body dressing: Maximal Assistance - Patient 25 - 49%     Toileting Toileting    Toileting assist Assist for toileting: Dependent - Patient 0% (3 helpers)     Transfers Chair/bed transfer  Transfers assist     Chair/bed transfer assist level: 2  Helpers     Locomotion Ambulation   Ambulation assist      Assist level: 2 helpers Assistive device: Parallel bars Max distance: 6 ft   Walk 10 feet activity   Assist     Assist level: 2 helpers Assistive device: Hand held assist   Walk 50 feet activity   Assist Walk 50 feet with 2 turns activity did not occur:  Safety/medical concerns  Assist level: 2 helpers Assistive device: Hand held assist    Walk 150 feet activity   Assist Walk 150 feet activity did not occur: Safety/medical concerns         Walk 10 feet on uneven surface  activity   Assist Walk 10 feet on uneven surfaces activity did not occur: Safety/medical concerns         Wheelchair     Assist Will patient use wheelchair at discharge?: No             Wheelchair 50 feet with 2 turns activity    Assist            Wheelchair 150 feet activity     Assist          Blood pressure (!) 151/45, pulse 62, temperature 98.4 F (36.9 C), resp. rate 16, height 5\' 7"  (1.702 m), weight 69.2 kg, SpO2 97 %.    Medical Problem List and Plan: 1.  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 Involvement of right frontal lobe too.  Appreciate neurology recs.  -currently 12/28 is DC date   -appreciate neurology help.   -Continue CIR therapies as tolerated  12/14 team and family conference today. Palliative care to be part of team conference. Appreciate Dr. Phoebe Sharps conversation with Mrs Otelia Santee yesterday. Appreciate all team members who have worked together on this case. 2.  Antithrombotics: -DVT/anticoagulation: SCDs             -antiplatelet therapy: Aspirin 81 mg daily and Brilinta 90 mg twice daily x3 months then Brilinta alone   -12/13 daughter spoke with Neurology yesterday re: Pattricia Boss and overall neurological picture.  3. Pain Management: Neurontin 300 mg twice daily, Cymbalta 30 mg daily.   -has pain in right foot at night which is ongoing  - scheduled tylenol 4. Mood: Namenda 10 mg twice daily, Aricept 10 mg daily             -antipsychotic agents: N/A  -12/13 continue ritalin 5mg  bid for now  5. Neuropsych: This patient is capable of  making decisions on his own behalf. 6. Skin/Wound Care: Routine skin checks  -kerlix wrap  for right foot to keep ischemic toe from catching  Continue pressure relief for coccyx ulcer 7. Fluids/Electrolytes/Nutrition: family/staff to encourage PO  -12/10 after a better Wednesday, ate less than 25% Thursday   -prealbumin only 18.9   -BUN/Cr sl up---> resume HS IVF   -continue K+ supplement (3.9)   -added low dose remeron for appetite   -pt does not want NGT  12/14- daughter has asked that we not pursue NGT at this time  8.  Dysphagia.    D2 nectars-->downgraded to D1 12/13 d/t lethargy 9.  Hypertension.  Norvasc 10 mg daily, Coreg 6.25 mg twice daily, Cardura 4 mg daily, hydralazine 100 mg 3 times daily, HCTZ 12.5 mg daily, Imdur 60 mg daily.   -  controlled Allow some elevation for perfusion 10.  History of prostate cancer.  Continue Proscar 5 mg daily. 11.  Diabetes mellitus with hyperglycemia.  Hemoglobin A1c 6.0.    lantus to 24u qam  12/13 lantus decreased d/t poor, lack of intake.  ---sugars controlled at present 12.  Diastolic congestive heart failure.  Monitor for any signs of fluid overload   Filed Weights   01/14/20 0600 01/15/20 1005 01/21/20 0300  Weight: 72.2 kg 73.1 kg 69.2 kg   12/14 weight trending down   13.  History of gout.  Allopurinol 100 mg daily.  Monitor for any gout flareups 14.  Acute on chronic anemia with heme + stool.       Continue Protonix 40 mg daily  Transfused 1 unit PRBC on 12/5  Hemoglobin 8.7 on 12/6  GI saw patient on acute and deferred endo/colonoscopy unless gross, significant bleeding  12/10 Hgb sl down again to 8.3---transfuse if <7.5   -no gross blood in stool  12/13 hgb is down 7.8--> 7.7 12/14   -monitor serially   -no gross blood 15.  Hyperlipidemia.  Lipitor 16.  CAD with PCI.  Continue aspirin and Brilinta for now follow-up outpatient cardiology service.  No current plan for ischemic work-up 17.  CKD stage III.  Follow-up chemistries.   Creatinine 1.42 12/10  12/10 resuming fluids as above 18. Fever. UTI and/or aspiration pneumonia   -UCX + for 100K klebsiella pneumoniae   -MRSA PCR negative   -vanc d/c'ed, continue with cefepime in the short term   -temps down, WBC's improved to 11.9k this morning   -supportive care      LOS: 15 days A FACE TO FACE EVALUATION WAS PERFORMED  Meredith Staggers 01/25/2020, 9:19 AM

## 2020-01-25 NOTE — Progress Notes (Signed)
STROKE TEAM PROGRESS NOTE   INTERVAL HISTORY No family at the bedside. Pt lying in bed, initially sleeping, but easily open eyes with voice. He still orientated x 3 but fall back to sleep if no repetitive stimulation. However, no significant neuro changes. No fever overnight, stable Cre and improved leukocytosis, and stable Hb  OBJECTIVE Vitals:   01/24/20 2119 01/25/20 0105 01/25/20 0514 01/25/20 1349  BP: (!) 145/46 (!) 175/65 (!) 151/45 (!) 137/32  Pulse: (!) 55 62 62 61  Resp: 20 20 16 15   Temp: 98.2 F (36.8 C) 97.8 F (36.6 C) 98.4 F (36.9 C) 98.2 F (36.8 C)  TempSrc:  Oral    SpO2: 99% 98% 97% 100%  Weight:      Height:       CBC:  Recent Labs  Lab 01/23/20 2242 01/24/20 0610 01/25/20 0456  WBC 15.6* 17.5* 11.9*  NEUTROABS 14.0*  --   --   HGB 9.2* 7.8* 7.7*  HCT 29.0* 24.7* 23.3*  MCV 84.3 84.3 83.8  PLT 408* 338 008   Basic Metabolic Panel:  Recent Labs  Lab 01/23/20 2242 01/25/20 0456  NA 139 141  K 4.0 3.7  CL 106 110  CO2 22 21*  GLUCOSE 225* 120*  BUN 35* 39*  CREATININE 1.77* 1.73*  CALCIUM 8.6* 8.3*   Lipid Panel:     Component Value Date/Time   CHOL 85 12/31/2019 0318   TRIG 152 (H) 12/31/2019 0318   HDL 44 12/31/2019 0318   CHOLHDL 1.9 12/31/2019 0318   VLDL 30 12/31/2019 0318   LDLCALC 11 12/31/2019 0318   HgbA1c:  Lab Results  Component Value Date   HGBA1C 6.0 (H) 12/31/2019   Urine Drug Screen:     Component Value Date/Time   LABOPIA NONE DETECTED 12/31/2019 1806   COCAINSCRNUR NONE DETECTED 12/31/2019 1806   LABBENZ NONE DETECTED 12/31/2019 1806   AMPHETMU NONE DETECTED 12/31/2019 1806   THCU NONE DETECTED 12/31/2019 1806   LABBARB NONE DETECTED 12/31/2019 1806    Alcohol Level     Component Value Date/Time   ETH <10 12/30/2019 2315    IMAGING  CT ANGIO HEAD W OR WO CONTRAST  Result Date: 01/16/2020 CLINICAL DATA:  Stroke suspected. EXAM: CT ANGIOGRAPHY HEAD TECHNIQUE: Multidetector CT imaging of the head was  performed using the standard protocol during bolus administration of intravenous contrast. Multiplanar CT image reconstructions and MIPs were obtained to evaluate the vascular anatomy. CONTRAST:  40mL OMNIPAQUE IOHEXOL 350 MG/ML SOLN COMPARISON:  Brain MRI 01/15/2020. Prior noncontrast head CT examinations 01/12/2020 and earlier. CT angiogram head/neck 12/30/2019. FINDINGS: CT HEAD Brain: Mild cerebral atrophy. Redemonstrated acute/subacute infarcts within the right corona radiata, right frontal lobe, right insula/subinsular white matter and right basal ganglia, which appear unchanged as compared to the brain MRI performed yesterday 01/15/2020. No interval demarcated cortical infarct is identified. No evidence of hemorrhagic conversion. Stable background chronic small vessel ischemic disease. No evidence of intracranial mass. No midline shift. Vascular: Reported below. Skull: Normal. Negative for fracture or focal lesion. Sinuses: No significant paranasal sinus disease. Orbits: No mass or acute finding. CTA HEAD Anterior circulation: The intracranial internal carotid arteries are patent. Redemonstrated prominent calcified plaque throughout the intracranial right ICA with multifocal stenoses. Most notably, there is redemonstrated severe stenosis of the cavernous segment. Redemonstrated calcified plaque throughout the intracranial left ICA with no more than mild to moderate stenosis. Progressive 10 mm long near occlusive stenosis of the M1 right MCA versus segmental occlusion with  some distal reconstitution. Some enhancement is seen more distally within the M1 right MCA and right M2 vessels, although markedly diminished as compared to the left and decreased as compared to the prior CT a of 12/30/2019. The M1 left MCA is patent without significant stenosis. No left M2 proximal branch occlusion or high-grade proximal stenosis is identified. The A1 right ACA is hypoplastic with redemonstrated severe proximal stenosis.  Posterior circulation: The intracranial vertebral arteries are patent. Atherosclerotic plaque within both vessels. Moderate/severe stenosis on the right. Mild/moderate stenosis on the left. The basilar artery is patent. 1-2 mm outpouching arising from the right P1/P2 junction favored to reflect minimal residual enhancement within an otherwise occluded right posterior communicating artery, unchanged. The posterior cerebral arteries are patent. Predominantly fetal origin of the left posterior cerebral artery. Venous sinuses: Within the limitations of contrast timing, no convincing thrombus. Anatomic variants: As described Impression 1 under the CTA head sectrion below will be called to the ordering clinician or representative by the Radiologist Assistant, and communication documented in the PACS or [employer]. IMPRESSION: CT head: Acute/subacute infarcts within the right corona radiata, right frontal lobe, right insula/subinsular white matter and right basal ganglia appear unchanged as compared to the brain MRI performed yesterday. No interval demarcated cortical infarct is identified. No evidence of hemorrhagic conversion. CTA head: 1. Progressive 10 mm long near occlusive stenosis of the M1 right MCA versus now segmental occlusion with some distal reconstitution. Some enhancement is seen more distally within the M1 and M2 right MCA vessels, although markedly diminished as compared to the left and significantly decreased as compared to the prior CTA of 12/30/2019. 2. Otherwise, no significant interval change as compared to the prior CTA head of 12/30/2019 as detailed. Electronically Signed   By: Kellie Simmering DO   On: 01/16/2020 14:42   MR BRAIN WO CONTRAST  Result Date: 01/15/2020 CLINICAL DATA:  Stroke, follow-up. EXAM: MRI HEAD WITHOUT CONTRAST TECHNIQUE: Multiplanar, multiecho pulse sequences of the brain and surrounding structures were obtained without intravenous contrast. COMPARISON:  Prior head CT  examinations 01/12/2020 and earlier. Brain MRI 12/31/2019. CT angiogram head/neck 12/30/2019. FINDINGS: Brain: The examination is intermittently motion degraded. Most notably, there is moderate/severe motion degradation of the coronal T2 weighted sequence and moderate motion degradation of the sagittal T1 weighted sequence. There is persistent subtle diffusion hyperintensity at site of the known, now subacute infarcts within the right insular cortex, subinsular white matter and right frontal operculum. However, new from the prior MRI of 12/31/2019, there is an acute/early subacute infarct within the right corona radiata measuring 4.5 x 0.8 x 1.6 cm (AP x TV x CC) (for instance as seen on series 5, image 77) (series 7, image 49). New separate smaller acute/early subacute infarcts within the right corona radiata, right subinsular white matter and right lentiform nucleus. New punctate acute/early subacute infarct within the right frontal lobe motor strip (series 5, image 82). Corresponding T2/FLAIR hyperintensity at these sites. Mild multifocal T2/FLAIR hyperintensity within the cerebral white matter elsewhere is nonspecific, but compatible with chronic small vessel ischemic disease. Redemonstrated small chronic lacunar infarct within the left lentiform nucleus. No evidence of intracranial mass. No chronic intracranial blood products. No extra-axial fluid collection. No midline shift. Vascular: Known high-grade proximal M1 right MCA stenosis demonstrated on the prior CTA head/neck of 12/30/2019. Skull and upper cervical spine: No focal marrow lesion. Sinuses/Orbits: Visualized orbits show no acute finding. Trace ethmoid sinus mucosal thickening. Other: Bilateral mastoid effusions. IMPRESSION: Motion degraded examination as  described. New from the prior MRI of 12/31/2019, there is an acute/early subacute infarct within the right corona radiata measuring 4.5 x 1.6 cm. New separate smaller acute/early subacute infarcts  within the right corona radiata, right subinsular white matter and right lentiform nucleus. Additional punctate acute/early subacute infarct within the right frontal lobe motor strip. These findings are superimposed upon background known subacute right MCA infarcts. Stable background cerebral atrophy and chronic small vessel ischemic disease. Electronically Signed   By: Kellie Simmering DO   On: 01/15/2020 22:24   ECHOCARDIOGRAM COMPLETE  Result Date: 12/31/2019    ECHOCARDIOGRAM REPORT   Patient Name:   ALEXIZ Mottram Date of Exam: 12/31/2019 Medical Rec #:  [id number]        Height:       67.0 in Accession #:    [id number]       Weight:       160.0 lb Date of Birth:  Feb 18, 1930        BSA:          1.839 m Patient Age:    66 years         BP:           142/50 mmHg Patient Gender: M                HR:           69 bpm. Exam Location:  Inpatient Procedure: 2D Echo Indications:    stroke 434.91  History:        Patient has prior history of Echocardiogram examinations, most                 recent 06/27/2019. CAD, chronic kidney disease; Risk                 Factors:Hypertension and Diabetes.  Sonographer:    Johny Chess Referring Phys: [id number] ASHISH ARORA IMPRESSIONS  1. Left ventricular ejection fraction, by estimation, is 60 to 65%. The left ventricle has normal function. The left ventricle has no regional wall motion abnormalities. There is mild concentric left ventricular hypertrophy. Left ventricular diastolic parameters are consistent with Grade II diastolic dysfunction (pseudonormalization). Elevated left atrial pressure. The E/e' is 95.  2. Right ventricular systolic function is normal. The right ventricular size is normal. Tricuspid regurgitation signal is inadequate for assessing PA pressure.  3. Left atrial size was mildly dilated.  4. The mitral valve is degenerative. No evidence of mitral valve regurgitation. No evidence of mitral stenosis.  5. The aortic valve is tricuspid. Aortic valve  regurgitation is not visualized. No aortic stenosis is present.  6. The inferior vena cava is normal in size with greater than 50% respiratory variability, suggesting right atrial pressure of 3 mmHg. Comparison(s): No prior Echocardiogram. Conclusion(s)/Recommendation(s): No intracardiac source of embolism detected on this transthoracic study. A transesophageal echocardiogram is recommended to exclude cardiac source of embolism if clinically indicated. FINDINGS  Left Ventricle: Left ventricular ejection fraction, by estimation, is 60 to 65%. The left ventricle has normal function. The left ventricle has no regional wall motion abnormalities. The left ventricular internal cavity size was normal in size. There is  mild concentric left ventricular hypertrophy. Left ventricular diastolic parameters are consistent with Grade II diastolic dysfunction (pseudonormalization). Elevated left atrial pressure. The E/e' is 68. Right Ventricle: The right ventricular size is normal. No increase in right ventricular wall thickness. Right ventricular systolic function is normal. Tricuspid regurgitation signal is inadequate for assessing PA pressure. Left Atrium: Left  atrial size was mildly dilated. Right Atrium: Right atrial size was normal in size. Pericardium: Trivial pericardial effusion is present. Mitral Valve: The mitral valve is degenerative in appearance. There is mild calcification of the anterior mitral valve leaflet(s). No evidence of mitral valve regurgitation. No evidence of mitral valve stenosis. Tricuspid Valve: The tricuspid valve is grossly normal. Tricuspid valve regurgitation is trivial. No evidence of tricuspid stenosis. Aortic Valve: The aortic valve is tricuspid. Aortic valve regurgitation is not visualized. No aortic stenosis is present. Pulmonic Valve: The pulmonic valve was grossly normal. Pulmonic valve regurgitation is not visualized. No evidence of pulmonic stenosis. Aorta: The aortic root and ascending  aorta are structurally normal, with no evidence of dilitation. Venous: The inferior vena cava is normal in size with greater than 50% respiratory variability, suggesting right atrial pressure of 3 mmHg. IAS/Shunts: The atrial septum is grossly normal. Additional Comments: There is a small pleural effusion in the right lateral region.  LEFT VENTRICLE PLAX 2D LVIDd:         4.90 cm  Diastology LVIDs:         3.30 cm  LV e' medial:    4.57 cm/s LV PW:         1.10 cm  LV E/e' medial:  18.4 LV IVS:        1.30 cm  LV e' lateral:   5.98 cm/s LVOT diam:     2.20 cm  LV E/e' lateral: 14.0 LV SV:         111 LV SV Index:   60 LVOT Area:     3.80 cm  RIGHT VENTRICLE             IVC RV S prime:     15.30 cm/s  IVC diam: 1.60 cm TAPSE (M-mode): 2.4 cm LEFT ATRIUM             Index       RIGHT ATRIUM           Index LA diam:        4.20 cm 2.28 cm/m  RA Area:     13.30 cm LA Vol (A2C):   78.9 ml 42.90 ml/m RA Volume:   31.50 ml  17.13 ml/m LA Vol (A4C):   59.4 ml 32.30 ml/m LA Biplane Vol: 69.2 ml 37.62 ml/m  AORTIC VALVE LVOT Vmax:   124.00 cm/s LVOT Vmean:  82.400 cm/s LVOT VTI:    0.292 m  AORTA Ao Root diam: 3.20 cm Ao Asc diam:  3.00 cm MITRAL VALVE MV Area (PHT): 2.83 cm     SHUNTS MV Decel Time: 268 msec     Systemic VTI:  0.29 m MV E velocity: 83.90 cm/s   Systemic Diam: 2.20 cm MV A velocity: 131.00 cm/s MV E/A ratio:  0.64 Eleonore Chiquito MD Electronically signed by Eleonore Chiquito MD Signature Date/Time: 12/31/2019/11:18:26 AM    Final     PHYSICAL EXAM     Temp:  [96.5 F (35.8 C)-98.4 F (36.9 C)] 98.2 F (36.8 C) (12/14 1349) Pulse Rate:  [55-62] 61 (12/14 1349) Resp:  [15-20] 15 (12/14 1349) BP: (137-175)/(32-65) 137/32 (12/14 1349) SpO2:  [97 %-100 %] 100 % (12/14 1349)  General - Well nourished, well developed, lethargic.  Ophthalmologic - fundi not visualized due to noncooperation.  Cardiovascular - Regular rhythm and rate.  Neuro - initially sleeping, but eyes open on voice, follows  simple commands, no aphasia but paucity of speech, orientated to time, place and  age. Fall back to sleep without repetitive stimulation. Able to name 2/3 and repeat simple sentences.  No gaze palsy, tracking bilaterally.  Visual field fall.  PERRL.  Left facial droop, tongue protrusion to the left.  Left upper extremity 1-2/5 with muscle contraction but barely horizontal movement, right upper extremity at least 4/5.  Left lower extremity 2/5 proximal and 0/5 distally. RLE at least 3/5 proximal and distal.  Sensation seems decreased on the left upper and lower extremity.  Finger-to-nose intact around right upper extremity.  Gait not tested   ASSESSMENT/PLAN Mr. Douglass Dunshee is a 86 y.o. male with history of coronary artery disease, CKD stage III, dementia, diabetes, diastolic dysfunction, peripheral vascular disease, prostate cancer, history of multiple syncopal episodes, presented to the emergency room for evaluation of sudden onset of slurred speech, difficulty swallowing and left-sided weakness. He was scheduled for a GI scoping today, but could not go ahead with the procedure because of severely elevated blood pressures with systolic in the 536R.  His Plavix was on hold for procedure. The patient received IV t-PA Friday 12/31/19 at [place].  Stroke: left MCA multifocal infarcts, likely due to progressive right MCA occlusion  01/12/2020 CT head new rounded low-density is in the right periventricular white matter consistent with acute infarctions  01/15/2020 MRI new infarctions at right corona radiata, right subinsular white matter and right BG, additional punctate acute/early subacute infarct within the right frontal lobe motor strip  12/5 CTA head new progressive right M1 near occlusion with some distal reconstitution, markedly diminishes compared to prior CTA  Etiology for right M1 progressive occlusion could be due to fluctuation of blood pressure, progressive anemia, and infection  Now on ASA  and brilinta DAPT for 3 months, then brillinta alone.    Therapy recommendations:  CIR   Disposition:  Pending  History of stroke  12/30/2019 admitted for acute right MCA pachy infarcts s/p tPA, etiology unclear, cardioembolic vs. Large vessel disease source.   12/30/2019 CT Head - No acute intracranial infarct or other abnormality.   12/31/2019 CTA H&N - Approximate 8-9 mm severe near occlusive stenosis of the proximal right M1 segment. Right MCA branches attenuated but patent distally with no visible downstream occlusion. Additional severe proximal right A1 stenosis. ICA stenosis up to 50% on the right and 45% on the left.   12/31/2019 MRI head - Patchy and hazy diffusion abnormality involving the right insular cortex, subinsular white matter, and overlying right frontal operculum, consistent with acute right MCA  2D Echo - EF 60 - 65%. No cardiac source of emboli identified. territory ischemia.   Was recommended to consider 30 day cardiac event monitoring as outpt to rule out afib  [employer] Virus 2 - negative  LDL - 11  HgbA1c - 6.0  UDS neg  VTE prophylaxis - SCDs  Discharged to CIR on 01/10/20 with DAPT ASA and brilinta.  Leukocytosis Fever UTI  WBC 8.4->15.6->17.5->11.9  T-max 102.4->afebrile  UA WBC > 50  CXR New patchy opacities in both infrahilar regions, may be atelectasis or pneumonia. Aspiration is also considered.  Now off vancomycin, on cefepime (started 12/14)  Anemia of chronic disease  Hx of GIB  Hgb - 8.3->9.2->7.8->7.7  FOBT positive 01/09/20  Had PRBC transfusion 11/28 and 12/5  GI consulted no procedure needed inpt.  CBC monitoring  Dysphagia  Aspiration risk  Speech on board   CXR New patchy opacities in both infrahilar regions, may be atelectasis or pneumonia. Aspiration is also considered.  On  dys1 and nectar thick  AKI on CKD  Creatinine 1.34->1.42->1.77->1.73  Put on NS @ 50  BMP monitoring  Hypertensive  Urgency Bradycardia on BB  Home BP meds: Cozaar ; amlodipine ; HCTZ ; Coreg ; hydralazine ; Cardura  BP on the low side from time to time  on Coreg 6.25mg  bid; Norvasc 10mg  daily ; imdur 60mg  daily; Cardura 4mg  daily; and hydralazine 100mg  Tid  Avoid low BP . Long-term BP goal 130-150 given right M1 occlusion  Hyperlipidemia  Home Lipid lowering medication: Lipitor 80 mg daily  LDL 11, goal < 70  Current lipid lowering medication: decrease to lipitor 40   Continue statin at discharge  Diabetes  on lantus  HgbA1c 6.0, goal < 7.0  Controlled  CBG monitoring  Follow up with PCP  Other Stroke Risk Factors  Advanced age  Former cigarette smoker - quit  ETOH use, advised to drink no more than 1 alcoholic beverage per day.  CAD s/p stent  Other Active Problems  Code status - DNR   Dementia on Aricept and nemanda - continue   Hx of multiple syncopal episode (no AS on echo)  Necrotic R foot 2nd toe w/o planned amputation per podiatry, [facility] 12/2019.   Hospital day # 15  Neurology will sign off. Please call with questions. Pt will follow up with stroke clinic NP at [facility] in about 4 weeks. Thanks for the consult.  Rosalin Hawking, MD PhD Stroke Neurology 01/25/2020 7:49 PM    To contact Stroke Continuity provider, please refer to [email or web address] After hours, contact General Neurology

## 2020-01-25 NOTE — Progress Notes (Signed)
PT more alert then previous in the shift. Administered pt's hydralazine and brilinta

## 2020-01-25 NOTE — Progress Notes (Signed)
Speech Language Pathology Daily Session Note  Patient Details  Name: Peter [name] MRN: [id number] Date of Birth: Jun 26, 1930  Today's Date: 01/25/2020 SLP Individual Time: 1100-1125 SLP Individual Time Calculation (min): 25 min  Short Term Goals: Week 3: SLP Short Term Goal 1 (Week 3): Patient will consume current diet with minimal overt s/s of aspiration and mod A verbal cues for use of swallowing compensatory strategies (initate swallow, reduce oral holding, anterior spillage...etc.) SLP Short Term Goal 2 (Week 3): Pt will demonstrate sustained attention to functional tasks for 10 minutes with Mod A verbal cues for redirection. SLP Short Term Goal 3 (Week 3): Pt will recall daily and novel events with mod A verbal cues for use of external aids. SLP Short Term Goal 4 (Week 3): Pt will demonstrate use of speech intelligibility skills at the phrase level with 60% accuarcy with mod A verbal cues.  Skilled Therapeutic Interventions: Skilled treatment session focused on cognitive and dysphagia goals. Upon arrival, patient was awake while upright in the wheelchair. Patient requested "regular water," SLP provided education regarding aspiration risk and importance of thickened liquids. Patient verbalized understanding but will need reinforcement. Patient agreeable to "ice cream" and asked for a "chocolate/vanilla swirl." By the time SLP returned with the magic cup, patient had fallen asleep. SLP attempted to wake up patient with verbal and tactile cues without success. SLP began to push the wheelchair from the office and patient aroused. SLP explained she was pushing him back to his room and patient appeared confused and verbalized, "its all an illusion. Patient was back asleep by the time he was returned to the room. Patient left upright in the wheelchair with NT present. Continue with current plan of care.       Pain No/Denies Pain   Therapy/Group: Individual Therapy  Chaelyn Bunyan,  [place] 01/25/2020, 3:13 PM

## 2020-01-25 NOTE — Progress Notes (Signed)
Pt very alert this am, joking, and requesting "plain water" Pt educated on nectar thick liquids and aspiration risk. Nectar thick water given to pt with a spoon.

## 2020-01-25 NOTE — Plan of Care (Signed)
  Problem: RH Swallowing Goal: LTG Pt will demonstrate functional change in swallow as evidenced by bedside/clinical objective assessment (SLP) Description: LTG: Patient will demonstrate functional change in swallow as evidenced by bedside/clinical objective assessment (SLP) Outcome: Not Applicable Note: Goal Discharged due to change in medical status   Problem: RH Memory Goal: LTG Patient will use memory compensatory aids to (SLP) Description: LTG:  Patient will use memory compensatory aids to recall biographical/new, daily complex information with cues (SLP) Outcome: Not Applicable Note: Goal discharged due to change in medical status

## 2020-01-25 NOTE — Patient Care Conference (Addendum)
Inpatient RehabilitationTeam Conference and Plan of Care Update Date: 01/25/2020   Time: 10:18 AM    Patient Name: Peter [name]      Medical Record Number: [id number]  Date of Birth: 1930-11-16 Sex: Male         Room/Bed: 4W09C/4W09C-01 Payor Info: Payor: MEDICARE / Plan: MEDICARE PART A AND B / Product Type: *No Product type* /    Admit Date/Time:  01/10/2020  2:41 PM  Primary Diagnosis:  Right middle cerebral artery stroke [facility])  Hospital Problems: Principal Problem:   Right middle cerebral artery stroke ([place]) Active Problems:   Malnutrition of moderate degree   Essential hypertension   Decubitus ulcer of coccyx, stage II ([place])   Controlled type 2 diabetes mellitus with hyperglycemia, without long-term current use of insulin ([place])   Labile blood glucose   Hypokalemia   Acute on chronic anemia   Chronic diastolic congestive heart failure ([place])   Stage 3b chronic kidney disease [facility])    Expected Discharge Date: Expected Discharge Date: 02/10/20  Team Members Present: Physician leading conference: Dr. Alger Simons Care Coodinator Present: [name], LCSWA;Lorraine Cimmino Creig Hines, RN, BSN, CRRN Nurse Present: Other (comment) Tommie Sams, RN) PT Present: Apolinar Junes, PT OT Present: Laverle Hobby, OT SLP Present: Weston Anna, SLP PPS Coordinator present : Ileana Ladd, Burna Mortimer, SLP     Current Status/Progress Goal Weekly Team Focus  Bowel/Bladder   Pt currently incontinent of B/B. LBM 01/23/2020  Pt to regain continence  Assess B/B every shift and pRN   Swallow/Nutrition/ Hydration   dys 1 textures and nectar thick liquids via TSP, Max-Total A  Mod I - goals will be downgraded  safe PO consumption, use of swallow strategies to reduce oral holding and proper head positioning. education   ADL's   Heavy decline in function- incredibly lethargic, worsened L hemi. Max A all ADLs  downgraded to mod A  Arousal, sitting balance, LUE NMR, ADL  retraining, d/c planning   Mobility   Significant decline in functional mobility, max=total A +2 for all mobility, dense L hemi, intermittently lethargic  Mod A overall  Activity tolerance, arousal, functional mobility, sitting tolerance, OOB tolerance, L UE/LE NMR and attention, midline orientation, patient/caregiver education.   Communication   Mod-Max A at phrase level  Mod I - goals will be downgraded  use of speech intelligibility strategies and education   Safety/Cognition/ Behavioral Observations  Mod-max A  Supervision - goals will be downgraded  sustained attention and recall   Pain   FACCES scale 0/10  0/10  Assess every shift and PRN   Skin   necrotic middle right toe  No new skin breakdown  Assess skin every shift and PRN     Discharge Planning:  Pt dtr [name] reports they currently have a private aide that helps 7days per week 12pm-6pm in which she was only proving supervision level of care and assistsanace with IADLs. Reports pt was independent prior to admission. Pt currently established with [facility] for HHPT. Family conference at 11:30am with pt dtrs and palliative care to be present to discuss plan of care at discharge.   Team Discussion: Patient has UTI that is being treated, family conference being held today, Palliative Care is now involved. Patient is alert this morning and looking better than yesterday. Currently incontinent B/B, no complaints of pain, and necrotic right middle toe. OT reports patient has had a heavy decline in function, left side has no active function, and transfers are total assist +2.  PT reports patient is lethargic, dysarthric. Last week was standing in parallel bars. SLP reports that patient's diet was downgraded to Dys 2, MBS was cancelled due to lethargy and will not be rescheduled for several days. MD reports patient was able to squeeze his and using his left hand and he moved his left leg. Patient on target to meet rehab goals: yes, goals  were downgraded and stay was extended due to the extension of his stroke.  *See Care Plan and progress notes for long and short-term goals.   Revisions to Treatment Plan:  Therapy goals downgraded UTI being treated MBS to be reschedule when appropriate  Teaching Needs: Continue family education Wound/skin care education  Current Barriers to Discharge: Decreased caregiver support, Home enviroment access/layout, Incontinence, Wound care, Lack of/limited family support, Behavior and Nutritional means  Possible Resolutions to Barriers: Continue current medications, continue wound/skin care as appropriate, continue toileting schedule, provide emotional support to patient and family     Medical Summary Current Status: new klebisella uti. exacerbated left hemiparesis. poor po intake, ongoing anemia. remains on brillinta. more alert today after 36 hours of abx  Barriers to Discharge: Medical stability   Possible Resolutions to [employer] Focus: ongoing discussion with family re: goals of care. palliative care involvement, nutritional losses   Continued Need for Acute Rehabilitation Level of Care: The patient requires daily medical management by a physician with specialized training in physical medicine and rehabilitation for the following reasons: Direction of a multidisciplinary physical rehabilitation program to maximize functional independence : Yes Medical management of patient stability for increased activity during participation in an intensive rehabilitation regime.: Yes Analysis of laboratory values and/or radiology reports with any subsequent need for medication adjustment and/or medical intervention. : Yes   I attest that I was present, lead the team conference, and concur with the assessment and plan of the team.   Cristi Loron 01/25/2020, 2:11 PM

## 2020-01-25 NOTE — Progress Notes (Signed)
Pharmacy Antibiotic Note  Peter [name] is a 86 y.o. male with fever, found to have Kleb pneumo UTI.  Pharmacy has been consulted for cefepime dosing.  SCr 1.73, CrCL 28 ml/min, now afebrile, WBC down to 11.9.  Tolerating cefepime.  Plan: Reduce cefepime to 1gm IV Q24H for UTI indication Monitor renal fxn, clinical progress Consider switching to Cipro 500mg  PO/PT daily   Height: 5\' 7"  (170.2 cm) Weight: 69.2 kg (152 lb 8.9 oz) IBW/kg (Calculated) : 66.1  Temp (24hrs), Avg:97.7 F (36.5 C), Min:96.5 F (35.8 C), Max:98.4 F (36.9 C)  Recent Labs  Lab 01/21/20 0410 01/23/20 2242 01/24/20 0610 01/25/20 0456  WBC 8.4 15.6* 17.5* 11.9*  CREATININE 1.42* 1.77*  --  1.73*    Estimated Creatinine Clearance: 28.1 mL/min (A) (by C-G formula based on SCr of 1.73 mg/dL (H)).    Allergies  Allergen Reactions  . Penicillins Anaphylaxis    Reported airway compromise 50 years    Vanc 12/12 >> 12/14 Cefepime 12/12 >>  12/12 UCx - Kleb pneumo (S Ancef, Cipro, Septra) 12/12 BCx - NGTD 12/13 MRSA PCR - negative  Keana Dueitt D. Mina Marble, PharmD, BCPS, [place] 01/25/2020, 11:16 AM

## 2020-01-26 ENCOUNTER — Inpatient Hospital Stay (HOSPITAL_COMMUNITY): Payer: Medicare Other

## 2020-01-26 ENCOUNTER — Inpatient Hospital Stay (HOSPITAL_COMMUNITY): Payer: Medicare Other | Admitting: Speech Pathology

## 2020-01-26 ENCOUNTER — Other Ambulatory Visit: Payer: Medicare Other

## 2020-01-26 LAB — GLUCOSE, CAPILLARY
Glucose-Capillary: 111 mg/dL — ABNORMAL HIGH (ref 70–99)
Glucose-Capillary: 192 mg/dL — ABNORMAL HIGH (ref 70–99)
Glucose-Capillary: 178 mg/dL — ABNORMAL HIGH (ref 70–99)
Glucose-Capillary: 180 mg/dL — ABNORMAL HIGH (ref 70–99)

## 2020-01-26 MED ORDER — PROSOURCE TF PO LIQD
45.0000 mL | Freq: Two times a day (BID) | ORAL | Status: DC
  Administered 2020-01-26 – 2020-02-02 (×12): 45 mL
  Filled 2020-01-26 (×17): qty 45

## 2020-01-26 MED ORDER — TRAMADOL HCL 50 MG PO TABS
50.0000 mg | ORAL_TABLET | Freq: Once | ORAL | Status: DC
  Filled 2020-01-26 (×2): qty 1

## 2020-01-26 MED ORDER — OSMOLITE 1.5 CAL PO LIQD
1000.0000 mL | ORAL | Status: DC
  Administered 2020-01-26 – 2020-01-31 (×6): 1000 mL
  Filled 2020-01-26 (×2): qty 1000

## 2020-01-26 NOTE — Progress Notes (Signed)
Speech Language Pathology Daily Session Note  Patient Details  Name: Peter [name] MRN: [id number] Date of Birth: Dec 20, 1930  Today's Date: 01/26/2020 SLP Individual Time: 1345-1425 SLP Individual Time Calculation (min): 40 min  Short Term Goals: Week 3: SLP Short Term Goal 1 (Week 3): Patient will consume current diet with minimal overt s/s of aspiration and mod A verbal cues for use of swallowing compensatory strategies (initate swallow, reduce oral holding, anterior spillage...etc.) SLP Short Term Goal 2 (Week 3): Pt will demonstrate sustained attention to functional tasks for 10 minutes with Mod A verbal cues for redirection. SLP Short Term Goal 3 (Week 3): Pt will recall daily and novel events with mod A verbal cues for use of external aids. SLP Short Term Goal 4 (Week 3): Pt will demonstrate use of speech intelligibility skills at the phrase level with 60% accuarcy with mod A verbal cues.  Skilled Therapeutic Interventions: Skilled treatment session focused on cognitive goals. Upon arrival, patient was supine in bed and difficult to arouse. SLP provided environmental modifications, verbal cues and tactile cues. With additional time, patient eventually woke up. Patient requested to clean his mouth. Patient spent extensive time performing oral care. Patient requested to rinse his mouth with water. SLP provided small amounts of water with a suction to maximize safety and decrease aspiration risk. Patient became perseverative on "gargleing" water. SLP provided education multiple times on why that is unsafe at this time (fatigue, poor oral control, muscle weakness, etc). Patient continued to ask "why" repeatedly despite education and became verbally frustrated and somewhat inappropriate. SLP brought in ice chips and offered other snacks in which patient declined. Patient left upright in bed with alarm on and all needs within reach. Continue with current plan of care.      Pain No/Denies Pain    Therapy/Group: Individual Therapy  Peter [name] 01/26/2020, 2:59 PM

## 2020-01-26 NOTE — Procedures (Signed)
Cortrak  Person Inserting Tube:  Maylon Peppers C, RD Tube Type:  Cortrak - 43 inches Tube Location:  Left nare Initial Placement:  Stomach Secured by: Bridle Technique Used to Measure Tube Placement:  Documented cm marking at nare/ corner of mouth Cortrak Secured At:  65 cm    Cortrak Tube Team Note:  Consult received to place a Cortrak feeding tube.   No x-ray is required. RN may begin using tube.    If the tube becomes dislodged please keep the tube and contact the Cortrak team at [email or web address] (password [email or web address]) for replacement.  If after hours and replacement cannot be delayed, place a NG tube and confirm placement with an abdominal x-ray.    Lockie Pares., RD, LDN, CNSC See AMiON for contact information

## 2020-01-26 NOTE — Progress Notes (Signed)
Patient ID: Peter [name], male   DOB: September 26, 1930, 86 y.o.   MRN: [id number]   Patient/Family Conference  Patient/family in attendance: daughters Veronda Prude and Holiday representative in attendance: Oval Linsey (MD), Weston Anna (SLP), Laverle Hobby (OT), Apolinar Junes (PT), and Chrisylnn/RN with [facility]  Main focus: To discuss plan of care for patient at discharge  Synopsis of information shared: Overall overview of pt current functional abilities. Discussed pt current care needs which include nutrition, and the reason for recommending NGT or cortrak for a short period of time. As well as, issues related to swallowing and a repeat MBS will be completed once pt is able to more awake consistently. Pt will remain on pureed and nectar thick via teaspoon.  Discussed continuing to treat UTI with IV abx. Specialty w/c recommended- TIS and can process through insurance if amenable. Pt will continue to require 24/7 care at discharge.  Barriers/concerns expressed by patient and family: family concerns related to the two strokes patient had, and how pt has regressed and is making very little progress. Concerns about his currently swallowing and NGT.   Patient/family response: family amenable to NGT or Cortrak to help with nutrition needs for family. Discussed with SLP pureed foods, and calories are not a concern for him at this time. Encouraged no dairy products to use with thickener at this time. Family would like [name]. Would also like to use insurance to get pt specialty w/c (TIS). Family plans to follow-up with homecare agency to determine when they can come in for family education, and will work on having a live-in nanny for mother and father.   Follow-up/action plans: SW to follow-up with [name]. NGT or Cortrak to be scheduled for placement. Family to follow-up about family edu with homecare agency. Will use insurance to process w/c eval. Pt will remain an outpatient palliative patient  with [facility], and they will provide assistance and support as needed.   [name], MSW, [place] Office: [phone] Cell: [phone] Fax: [phone]

## 2020-01-26 NOTE — Progress Notes (Signed)
Physical Therapy Weekly Progress Note  Patient Details  Name: Peter [name] MRN: [id number] Date of Birth: 1931-01-07  Beginning of progress report period: January 18, 2020 End of progress report period: January 26, 2020  Today's Date: 01/26/2020 PT Individual Time:0900-0912, 1005-1040, and 9326-7124 PT Individual Time Calculation (min): 12 min, 35 min, and 45 min   Patient has met 2 of 4 short term goals.  Patient with functional decline this week. A family meeting was held to discuss patient/family goals, plan to continue progressing forward toward mod I goals for bed mobility and transfers at this time. Currently max A of 1-2 people for bed mobility and transfers based on level of arousal.  Patient continues to demonstrate the following deficits muscle weakness and muscle joint tightness, decreased cardiorespiratoy endurance, abnormal tone, motor apraxia, decreased coordination and decreased motor planning, decreased midline orientation and decreased attention to left, decreased initiation, decreased attention, decreased awareness, decreased problem solving, decreased safety awareness, decreased memory and delayed processing and decreased sitting balance, decreased standing balance, decreased postural control, hemiplegia and decreased balance strategies and therefore will continue to benefit from skilled PT intervention to increase functional independence with mobility.  Patient not progressing toward long term goals.  See goal revision..  Plan of care revisions: Discharged gait goals and car transfer goal, downgraded standing to max A +2 or total A with standing frame, and sitting balance goals to mod A due to functional decline.  PT Short Term Goals Week 2:  PT Short Term Goal 1 (Week 2): Patient will perform bed mobility with max A of 1 person consistently. PT Short Term Goal 1 - Progress (Week 2): Met PT Short Term Goal 2 (Week 2): Patient will perform basic transfers with max A of 1  person consistently. PT Short Term Goal 2 - Progress (Week 2): Progressing toward goal PT Short Term Goal 3 (Week 2): Patient will tolerate sitting in TIS w/c >2 hours per day. PT Short Term Goal 3 - Progress (Week 2): Met PT Short Term Goal 4 (Week 2): Patient will progress with ambulation >50 feet with max A +2. PT Short Term Goal 4 - Progress (Week 2): Not progressing Week 3:  PT Short Term Goal 1 (Week 3): Patient will perform bed mobility with mod A with use of hospital bed features. PT Short Term Goal 2 (Week 3): Patient will perform basic transfers with max A of 1 person consistently.  Skilled Therapeutic Interventions/Progress Updates:     Session 1 &2: Patient in TIS w/c in the room asleep upon PT arrival. Patient aroused briefly x3 to verbal and tactile stimulation. Reported increased fatigue and feeling comfortable in the w/c. Patient with poor head control and neck posture in present position. Tilted the TIS w/c back for improved head support and pressure relief. Provided soft tissue mobilization and PROM for cervical musculature due to stiffness and L inattention with R rotation. Patient with improved positioning and asleep after. Patient in [place] w/c in the room at end of session with breaks locked, seat belt alarm set, and all needs in reach.  Returned to complete session with patient in TIS w/c upon PT arrival. Patient easily aroused to verbal stimulation and agreeable to PT session. Patient reported mild-moderate R foot pain during session, RN made aware. PT provided repositioning, rest breaks, and distraction as pain interventions throughout session. Patient reporting increased discomfort in the TIS w/c and requesting to return to the bed.  Therapeutic Activity: Bed Mobility: Patient performed sit to supine  with max A of 1 person. Provided verbal cues for propping on his L elbow with facilitation for safe placement then coming to side-lying before rolling on his back for improved  trunk control and reduced assist level. Transfers: Patient performed a [employer] transfer TIS w/c>bed (deflated) with max A of 1 person and second person SBA for safety and total A for board placement from second person. Provided cues for hand placement, board placement, and head-hips relationship for proper technique and decreased assist with transfers.   Patient aroused throughout set-up and transfer, but fell asleep once in the bed. Scooted patient up in the bed for improved positioning with total A +2.   Patient in bed, with bed re-inflated, at end of session with breaks locked and all needs within reach.   Session 2: Patient sitting up in the bed upon PT arrival. Patient alert and agreeable to PT session. Patient with partially eaten meal try next to him, requesting "regular water." Educated on risk of aspiration with thin liquids at this time, patient continued to ask for thin water throughout session demeonstrating perseverative behavior.  Patient incontinent of bladder at beginning of session.   Therapeutic Activity: Bed Mobility: Patient performed rolling R/L x3 with max-total A to the R and max A to the L using R hand to pull with the bed rail while doff/don pants and incontinence brief and perform peri-care with total A. Patient able to hold himself in L side-lying once positioned throughout peri-care. He was able to bridge x2 while pulling pants up over his hips with manual facilitation for stabilizing his left leg to clear both hips. Performed scooting up in the bed with total A +2 x2, provided cues for chin tuck and holding arms across his chest. Provided hand over hand assist to grab and hold onto his L arm with his R.   Patient became perseverative on changing his dressing on his R foot. Explained that the dressing was changed by this therapist yesterday. Patient reporting increased pain in R foot and was visibly frustrated about the dressing needing changes. PT removed dressing,  noted very minimal drainage, less than yesterday, from lateral side of his second toe. No signs or symptoms of infection or visible changes noted at this time. Reapplied iodine swab and kerlix and placed large non-skid sock over his foot to hold/protect dressing.   Patient in bed with music playing at end of session with breaks locked and all needs within reach.    Therapy Documentation Precautions:  Precautions Precautions: Fall Precaution Comments: hx syncope, L lean, hx dementia Restrictions Weight Bearing Restrictions: No  Therapy/Group: Individual Therapy  Arbie Reisz L Cleophus Mendonsa PT, DPT  01/26/2020, 3:45 PM

## 2020-01-26 NOTE — Progress Notes (Addendum)
PHYSICAL MEDICINE & REHABILITATION PROGRESS NOTE   Subjective/Complaints: OT at bedside initiating therapy this morning. Didn't sleep that well last night. I believe it was related to anxiety about an NGT.  Patient had questions about how long a feeding tube would be in.   ROS: Limited due to cognitive/behavioral. Back feels a little better  Objective:   No results found. Recent Labs    01/24/20 0610 01/25/20 0456  WBC 17.5* 11.9*  HGB 7.8* 7.7*  HCT 24.7* 23.3*  PLT 338 339   Recent Labs    01/23/20 2242 01/25/20 0456  NA 139 141  K 4.0 3.7  CL 106 110  CO2 22 21*  GLUCOSE 225* 120*  BUN 35* 39*  CREATININE 1.77* 1.73*  CALCIUM 8.6* 8.3*    Intake/Output Summary (Last 24 hours) at 01/26/2020 0810 Last data filed at 01/26/2020 0400 Gross per 24 hour  Intake 170 ml  Output --  Net 170 ml    Physical Exam: Vital Signs Blood pressure (!) 155/45, pulse 63, temperature 98.5 F (36.9 C), resp. rate 18, height 5\' 7"  (1.702 m), weight 69.2 kg, SpO2 98 %. Constitutional: No distress . Vital signs reviewed. HEENT: EOMI, oral membranes moist Neck: supple Cardiovascular: RRR without murmur. No JVD    Respiratory/Chest: CTA Bilaterally without wheezes or rales. Normal effort    GI/Abdomen: BS +, non-tender, non-distended Ext: no clubbing, cyanosis, or edema Psych: pleasant and generally cooperative Skin: Warm ?mild redness at sacrum. Necrotic right 2nd toe without change Musc: No edema in extremities.  No tenderness in extremities. Intrinsic muscle atrophy bilateral hands Neuro: fairly alert at times. Dozed off while we were talking this morning (it was early). Motor: RUE/RLE: 4-4+ /5 proximal distal LUE: 1+ to 2/5 proximal--no changes today LLE: 1 to 2/5 proximal to distal  Assessment/Plan: 1. Functional deficits which require 3+ hours per day of interdisciplinary therapy in a comprehensive inpatient rehab setting.  Physiatrist is providing close team  supervision and 24 hour management of active medical problems listed below.  Physiatrist and rehab team continue to assess barriers to discharge/monitor patient progress toward functional and medical goals  Care Tool:  Bathing    Body parts bathed by patient: Right arm,Left arm,Chest,Abdomen,Front perineal area,Right upper leg,Left upper leg,Face   Body parts bathed by helper: Buttocks,Right lower leg,Left lower leg     Bathing assist Assist Level: Maximal Assistance - Patient 24 - 49%     Upper Body Dressing/Undressing Upper body dressing   What is the patient wearing?: Pull over shirt    Upper body assist Assist Level: Maximal Assistance - Patient 25 - 49% (sitting EOB)    Lower Body Dressing/Undressing Lower body dressing      What is the patient wearing?: Pants     Lower body assist Assist for lower body dressing: Maximal Assistance - Patient 25 - 49%     Toileting Toileting    Toileting assist Assist for toileting: Dependent - Patient 0% (3 helpers)     Transfers Chair/bed transfer  Transfers assist     Chair/bed transfer assist level: 2 Helpers     Locomotion Ambulation   Ambulation assist      Assist level: 2 helpers Assistive device: Parallel bars Max distance: 6 ft   Walk 10 feet activity   Assist     Assist level: 2 helpers Assistive device: Hand held assist   Walk 50 feet activity   Assist Walk 50 feet with 2 turns activity did not  occur: Safety/medical concerns  Assist level: 2 helpers Assistive device: Hand held assist    Walk 150 feet activity   Assist Walk 150 feet activity did not occur: Safety/medical concerns         Walk 10 feet on uneven surface  activity   Assist Walk 10 feet on uneven surfaces activity did not occur: Safety/medical concerns         Wheelchair     Assist Will patient use wheelchair at discharge?: No             Wheelchair 50 feet with 2 turns activity    Assist             Wheelchair 150 feet activity     Assist          Blood pressure (!) 155/45, pulse 63, temperature 98.5 F (36.9 C), resp. rate 18, height 5\' 7"  (1.702 m), weight 69.2 kg, SpO2 98 %.    Medical Problem List and Plan: 1.  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 Involvement of right frontal lobe too.  Appreciate neurology recs.  -currently 12/28 is DC date   -appreciate neurology help.   -Continue CIR therapies as tolerated  12/15 team and family conference held 12/14 to discuss patients current status, goals, discharge needs, prognosis, answer questions, etc. Patient's daughters attended in addition to palliative care.  2.  Antithrombotics: -DVT/anticoagulation: SCDs             -antiplatelet therapy: Aspirin 81 mg daily and Brilinta 90 mg twice daily x3 months then Brilinta alone   -neurology recommends the above regimen.  3. Pain Management: Neurontin 300 mg twice daily, Cymbalta 30 mg daily.   -has pain in right foot at night which is ongoing  - scheduled tylenol 4. Mood: Namenda 10 mg twice daily, Aricept 10 mg daily             -antipsychotic agents: N/A  -12/15 continue ritalin 5mg  bid for now, consider increase tomorrow 5. Neuropsych: This patient is capable of making decisions on his own behalf. 6. Skin/Wound Care: Routine skin checks  -kerlix wrap  for right foot to keep ischemic toe from catching  Continue pressure relief for coccyx ulcer 7. Fluids/Electrolytes/Nutrition: family/staff to encourage PO  -12/10 after a better Wednesday, ate less than 25% Thursday   -prealbumin only 18.9   -BUN/Cr sl up---> resume HS IVF   -continue K+ supplement (3.9)   -added low dose remeron for appetite   -pt does not want NGT  12/15 pt/family still discussing NGT. My plan would be to use for a week  or so to help boost his nutritional status.    -will increase IVF in short term to 75cc/hr 8.  Dysphagia.    D2 nectars-->downgraded to D1 12/13 d/t lethargy 9.  Hypertension.  Norvasc 10 mg daily, Coreg 6.25 mg twice daily, Cardura 4 mg daily, hydralazine 100 mg 3 times daily, HCTZ 12.5 mg daily, Imdur 60 mg daily.   -controlled Allow some elevation for perfusion 10.  History of prostate cancer.  Continue Proscar 5 mg daily. 11.  Diabetes mellitus with hyperglycemia.  Hemoglobin A1c 6.0.    lantus to 24u qam  12/13 lantus decreased d/t poor, lack of intake.  ---sugars controlled well enough at present 12.  Diastolic congestive heart  failure.  Monitor for any signs of fluid overload   Filed Weights   01/14/20 0600 01/15/20 1005 01/21/20 0300  Weight: 72.2 kg 73.1 kg 69.2 kg   12/14 weight trending down Need new weight today   -pt weighed 71.8 kg at admission to rehab  13.  History of gout.  Allopurinol 100 mg daily.  Monitor for any gout flareups 14.  Acute on chronic anemia with heme + stool.       Continue Protonix 40 mg daily  Transfused 1 unit PRBC on 12/5  Hemoglobin 8.7 on 12/6  GI saw patient on acute and deferred endo/colonoscopy unless gross, significant bleeding  12/10 Hgb sl down again to 8.3---transfuse if <7.5   -no gross blood in stool  12/13 hgb is down 7.8--> 7.7 12/14   -monitor serially   -no gross blood  12/15 recheck hgb Thursday 15.  Hyperlipidemia.  Lipitor 16.  CAD with PCI.  Continue aspirin and Brilinta for now follow-up outpatient cardiology service.  No current plan for ischemic work-up 17.  CKD stage III.  Follow-up chemistries.  Creatinine 1.42 12/10  12/10 resuming fluids as above 18. Fever. UTI and/or aspiration pneumonia   -UCX + for 100K klebsiella pneumoniae   -MRSA PCR negative   -vanc d/c'ed, continue with cefepime in the short term   -temps down, WBC's improved to 11.9k 12/14   -12/15 continue cefepime today, consider transition to oral  tomorrow    -recheck cbc tomorrow     LOS: 16 days A FACE TO FACE EVALUATION WAS PERFORMED  Meredith Staggers 01/26/2020, 8:10 AM

## 2020-01-26 NOTE — Plan of Care (Signed)
  Problem: RH Balance Goal: LTG Patient will maintain dynamic sitting balance (PT) Description: LTG:  Patient will maintain dynamic sitting balance with assistance during mobility activities (PT) Flowsheets (Taken 01/26/2020 1630) LTG: Pt will maintain dynamic sitting balance during mobility activities with:: (Downgraded goal due to functional decline in mobility.) Moderate Assistance - Patient 50 - 74% Note: Downgraded goal due to functional decline in mobility. Goal: LTG Patient will maintain dynamic standing balance (PT) Description: LTG:  Patient will maintain dynamic standing balance with assistance during mobility activities (PT) Flowsheets (Taken 01/26/2020 1630) LTG: Pt will maintain dynamic standing balance during mobility activities with:: (Downgraded goal due to functional decline.) Dependent - Patient equals 0% Note: With max A +2 or total A using standing frame for weight bearing and pressure relief.  Downgraded goal due to functional decline.   Problem: Sit to Stand Goal: LTG:  Patient will perform sit to stand with assistance level (PT) Description: LTG:  Patient will perform sit to stand with assistance level (PT) Flowsheets (Taken 01/26/2020 1630) LTG: PT will perform sit to stand in preparation for functional mobility with assistance level: (Downgraded goal due to functional decline.)  2 Helpers  Dependent - Patient equals 0% Note: With max A +2 or total A using standing frame for weight bearing and pressure relief.  Downgraded goal due to functional decline.

## 2020-01-26 NOTE — Progress Notes (Signed)
Nutrition Follow-up  DOCUMENTATION CODES:   Non-severe (moderate) malnutrition in context of chronic illness  INTERVENTION:   Once Cortrak placed, initiate tube feeds: - Start Osmolite 1.5 @ 25 ml/hr and advance by 10 ml q 4 hours to goal rate of 65 ml/hr (tube feeds can be held for up to 4 hours for therapies) - ProSource TF 45 ml BID  Tube feeding regimen at goal rate provides 2030kcal, 104grams of protein, and 949m of H2O.   -ContinueMagicCup TID with meals, each supplement provides 290 kcal and 9 grams of protein  -ContinueMighty Shake x 2 BID, each supplement provides 330 kcal and 9 grams of protein  - Vanilla Greek yogurt TID with meals  -Continue to encourage adequate PO intakeand provide feeding assistance with meals  NUTRITION DIAGNOSIS:   Severe Malnutrition related to chronic illness (dementia, CHF, prostate cancer) as evidenced by moderate fat depletion,severe muscle depletion,percent weight loss (10.4% weight loss in 6 months).  Ongoing  GOAL:   Patient will meet greater than or equal to 90% of their needs  Progressing, will be met via TF at goal rate  MONITOR:   PO intake,Supplement acceptance,Diet advancement,Labs,Weight trends,Skin,I & O's  REASON FOR ASSESSMENT:   Consult Enteral/tube feeding initiation and management  ASSESSMENT:   86year old male with PMH of CAD/PCI, CKD stage III, dementia, DM, CHF, HTN, HLD, PVD, prostate cancer. Presented 12/30/19 with left-sided weakness and slurred speech. Pt found to have acute right MCA stroke. Admitted to CIR on 11/29.  11/19 CIR admit weight: 71.8 kg 12/10 weight: 69.2 kg  Discussed pt with MD and PA. Plan is for Cortrak placement. Discussed plan with RN.  Spoke with pt at bedside. Noted ~10% completed lunch meal tray at bedside. Pt had consumed ~50% of the vanilla G[place]yogurt along with a 1-2 bites of each of the other food items on the meal tray. Overall, pt had consumed approximately  150 kcal and 10 grams of protein.  Pt reports that he likes both the M[employer]Max [employer]orporatedand the ML[employer] Claiborne Will continue with these supplements with meals in addition to tube feeds.  Per MD note, plan is to trial tube feeds for about 1 week and reassess.  Meal Completion: 0-100% x last 8 documented meals (extremely variable, averaging 18%)  Medications reviewed and include: SSI, lantus 15 units daily, ritalin, remeron, protonix, klor-con 20 mEq daily, IV abx IVF: NS @ 75 ml/hr  Labs reviewed: BUN 39, creatinine 1.73, hemoglobin 7.7 CBG's: 111-192 x 24 hours  Diet Order:   Diet Order            DIET - DYS 1 Room service appropriate? Yes; Fluid consistency: Nectar Thick  Diet effective now                 EDUCATION NEEDS:   Not appropriate for education at this time  Skin:  Skin Assessment: Skin Integrity Issues: Diabetic Ulcer: right middle toe Other: MASD to medial sacrum  Last BM:  01/23/20  Height:   Ht Readings from Last 1 Encounters:  01/10/20 _0  (1.702 m)    Weight:   Wt Readings from Last 1 Encounters:  01/21/20 69.2 kg    BMI:  Body mass index is 23.89 kg/m.  Estimated Nutritional Needs:   Kcal:  1900-2100  Protein:  100-115 grams  Fluid:  2.0 L/day    KGustavus Bryant MS, RD, LDN Inpatient Clinical Dietitian Please see AMiON for contact information.

## 2020-01-26 NOTE — Progress Notes (Signed)
Occupational Therapy Session Note  Patient Details  Name: Peter [name] MRN: [id number] Date of Birth: Jun 24, 1930  Today's Date: 01/26/2020 OT Individual Time: 0700-0759 OT Individual Time Calculation (min): 59 min    Short Term Goals: Week 1:  OT Short Term Goal 1 (Week 1): Pt will transfer to toilet/BSC with Max A of 1 OT Short Term Goal 1 - Progress (Week 1): Not met OT Short Term Goal 2 (Week 1): Pt will static stand with no more than Mod A during ADL OT Short Term Goal 2 - Progress (Week 1): Not met OT Short Term Goal 3 (Week 1): Pt will perform UB/LB bathing with Mod A with AE PRN OT Short Term Goal 3 - Progress (Week 1): Not met OT Short Term Goal 4 (Week 1): Pt will perform sit to stand Mod A to decrease BOC OT Short Term Goal 4 - Progress (Week 1): Not met  Skilled Therapeutic Interventions/Progress Updates:    1:1. Pt received in bed agreeable to OT. Pt aroused this morning when OT entered room and maintains arousal throughout. Pt completes rolling in B directions to doff soiled brief and don new brief (only mild urinary incontinence). Pt requires MOD to MAX A +2 for rolling during clothing management and peri cleansing. Pt reports, "looking L is a mental thing, its hard for me to pay attention" demo good insight into deficits. Pt completes supine>sitting with +2 A with bed fully deflated and 10" step with dycem on floor d/t height of bed. MAX A initially for sitting balance fading to MOD A. MAX A beezy transfer to R d/t sitting balance. Pt able to participate in pulling agcross board after +2 places board. A for pacing/VC for hard swallow of nectar thick water. 1 episde of coughing noted after swallow. Pt left in TIS locked, tilted back, lateral support/arrm support applied, music on, safety belt around hips, RN in room administering meds, and call light in reach  Therapy Documentation Precautions:  Precautions Precautions: Fall Precaution Comments: hx syncope, L lean, hx  dementia Restrictions Weight Bearing Restrictions: No General:   Vital Signs: Therapy Vitals Temp: 98.5 F (36.9 C) Pulse Rate: 63 Resp: 18 BP: (!) 155/45 Patient Position (if appropriate): Lying Oxygen Therapy SpO2: 98 % O2 Device: Room Air Pain:   ADL: ADL Upper Body Dressing: Maximal assistance Lower Body Dressing: Dependent Where Assessed-Lower Body Dressing: Edge of bed Toileting: Dependent Where Assessed-Toileting: Bed level Vision   Perception    Praxis   Exercises:   Other Treatments:     Therapy/Group: Individual Therapy  Tonny Branch 01/26/2020, 6:46 AM

## 2020-01-27 ENCOUNTER — Inpatient Hospital Stay (HOSPITAL_COMMUNITY): Payer: Medicare Other

## 2020-01-27 ENCOUNTER — Inpatient Hospital Stay (HOSPITAL_COMMUNITY): Payer: Medicare Other | Admitting: Speech Pathology

## 2020-01-27 ENCOUNTER — Ambulatory Visit: Payer: Medicare Other | Admitting: Physician Assistant

## 2020-01-27 ENCOUNTER — Telehealth: Payer: Self-pay | Admitting: Family Medicine

## 2020-01-27 LAB — PREPARE RBC (CROSSMATCH)

## 2020-01-27 LAB — CBC
HCT: 23.7 % — ABNORMAL LOW (ref 39.0–52.0)
Hemoglobin: 7.4 g/dL — ABNORMAL LOW (ref 13.0–17.0)
MCH: 26.4 pg (ref 26.0–34.0)
MCHC: 31.2 g/dL (ref 30.0–36.0)
MCV: 84.6 fL (ref 80.0–100.0)
Platelets: 327 10*3/uL (ref 150–400)
RBC: 2.8 MIL/uL — ABNORMAL LOW (ref 4.22–5.81)
RDW: 15.6 % — ABNORMAL HIGH (ref 11.5–15.5)
WBC: 10.9 10*3/uL — ABNORMAL HIGH (ref 4.0–10.5)
nRBC: 0 % (ref 0.0–0.2)

## 2020-01-27 LAB — BASIC METABOLIC PANEL
Anion gap: 8 (ref 5–15)
BUN: 35 mg/dL — ABNORMAL HIGH (ref 8–23)
CO2: 21 mmol/L — ABNORMAL LOW (ref 22–32)
Calcium: 8.2 mg/dL — ABNORMAL LOW (ref 8.9–10.3)
Chloride: 112 mmol/L — ABNORMAL HIGH (ref 98–111)
Creatinine, Ser: 1.42 mg/dL — ABNORMAL HIGH (ref 0.61–1.24)
GFR, Estimated: 48 mL/min — ABNORMAL LOW (ref >60)
Glucose, Bld: 243 mg/dL — ABNORMAL HIGH (ref 70–99)
Potassium: 3.9 mmol/L (ref 3.5–5.1)
Sodium: 141 mmol/L (ref 135–145)

## 2020-01-27 LAB — GLUCOSE, CAPILLARY
Glucose-Capillary: 212 mg/dL — ABNORMAL HIGH (ref 70–99)
Glucose-Capillary: 106 mg/dL — ABNORMAL HIGH (ref 70–99)
Glucose-Capillary: 249 mg/dL — ABNORMAL HIGH (ref 70–99)
Glucose-Capillary: 213 mg/dL — ABNORMAL HIGH (ref 70–99)

## 2020-01-27 LAB — PREALBUMIN: Prealbumin: 15.9 mg/dL — ABNORMAL LOW (ref 18–38)

## 2020-01-27 IMAGING — DX DG CHEST 1V PORT
1 series · 1 of 1 positions shown · non-contrast
Comparison: [DATE]

CLINICAL DATA: Cough

EXAM:
PORTABLE CHEST 1 VIEW

[chest]
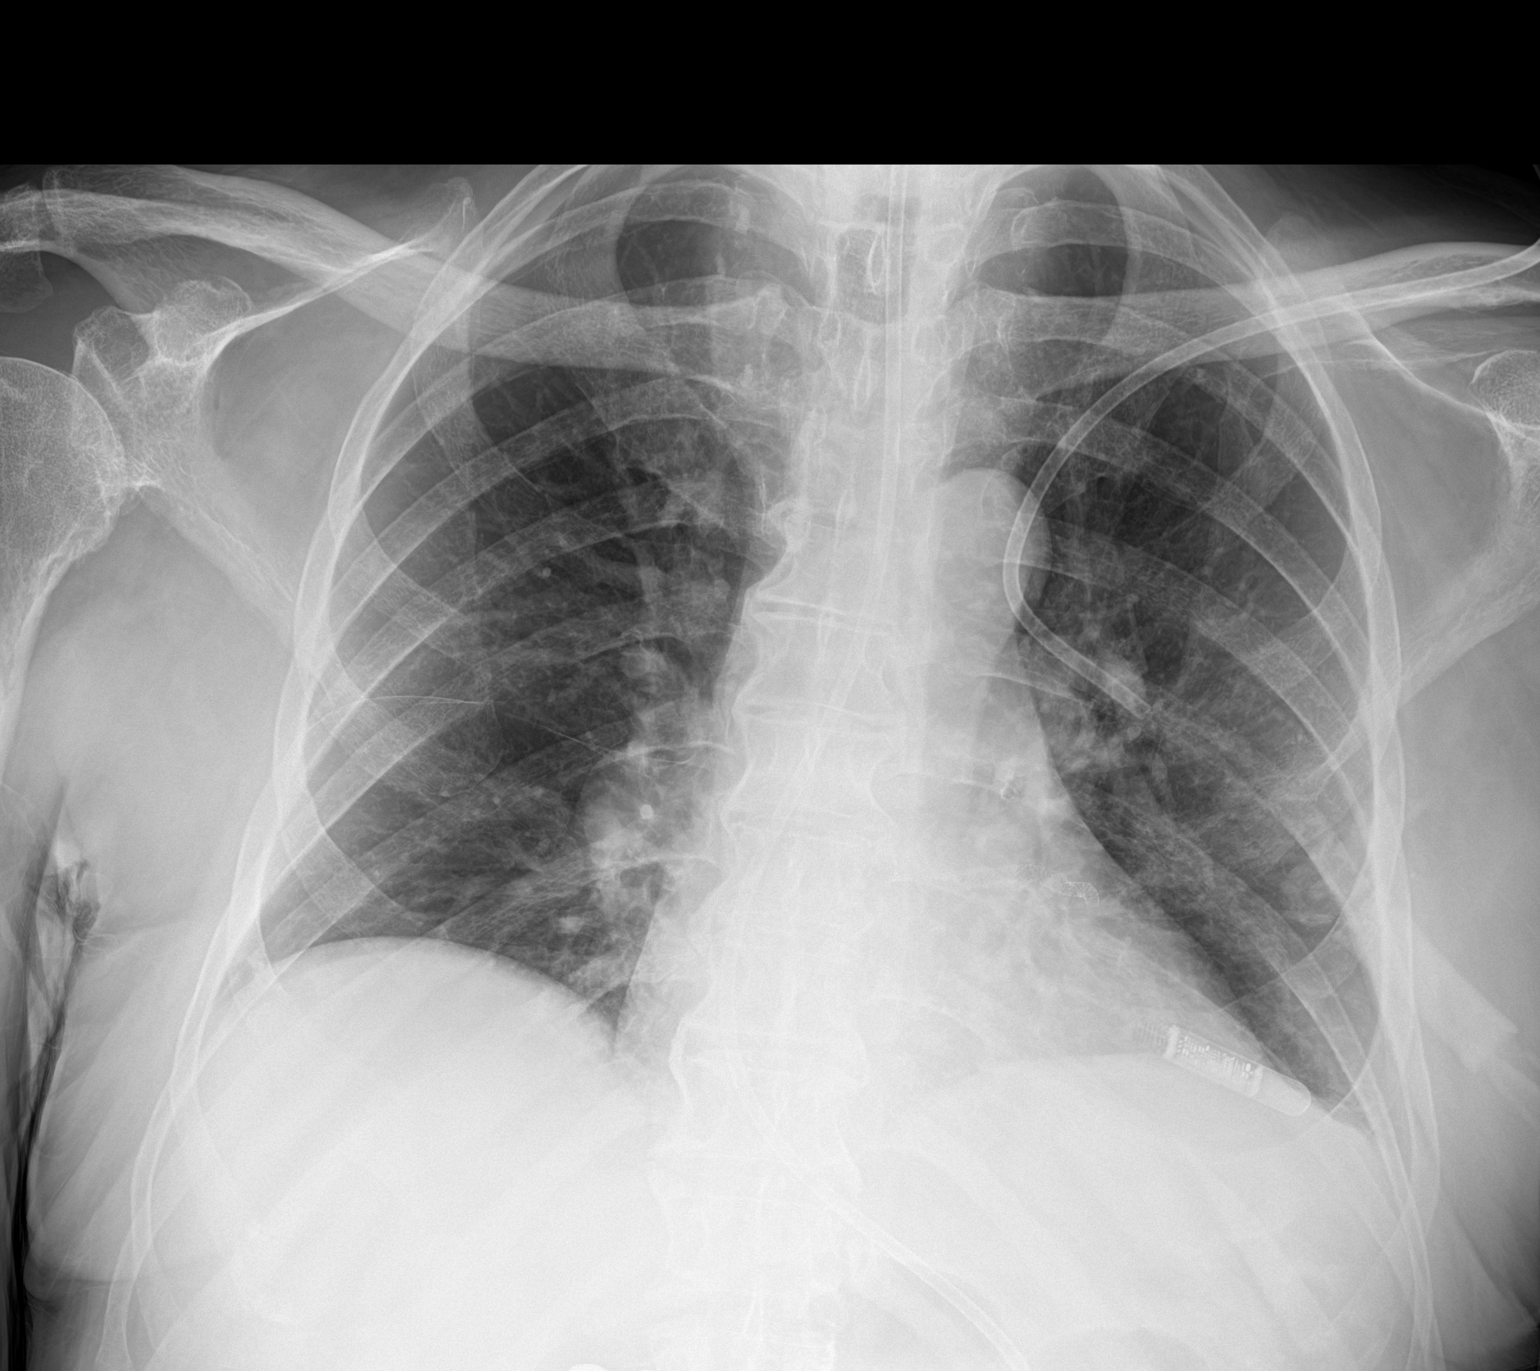

[1 of 1 positions shown; findings below may reference images not displayed]

FINDINGS: No edema or airspace opacity. Heart size and pulmonary vascularity
are normal. No adenopathy. There is a loop recorder on the left.
Enteric tube tip is below the diaphragm. Coronary stent noted on the
left.
IMPRESSION: No edema or consolidation. Enteric tube tip below diaphragm. Heart
size normal.

## 2020-01-27 MED ORDER — METHYLPHENIDATE HCL 5 MG PO TABS
10.0000 mg | ORAL_TABLET | Freq: Two times a day (BID) | ORAL | Status: DC
  Administered 2020-01-27 – 2020-02-04 (×14): 10 mg via ORAL
  Filled 2020-01-27 (×14): qty 2

## 2020-01-27 MED ORDER — SODIUM CHLORIDE 0.9 % IV SOLN: 1.0000 g | INTRAVENOUS | Status: DC

## 2020-01-27 MED ORDER — SODIUM CHLORIDE 0.9 % IV SOLN
2.0000 g | INTRAVENOUS | Status: DC
  Administered 2020-01-28: 2 g via INTRAVENOUS
  Filled 2020-01-27: qty 2

## 2020-01-27 MED ORDER — ACETAMINOPHEN 325 MG PO TABS
650.0000 mg | ORAL_TABLET | Freq: Once | ORAL | Status: AC
  Administered 2020-01-27: 650 mg via ORAL

## 2020-01-27 MED ORDER — SODIUM CHLORIDE 0.9% IV SOLUTION: Freq: Once | INTRAVENOUS | Status: DC

## 2020-01-27 MED ORDER — INSULIN GLARGINE 100 UNIT/ML ~~LOC~~ SOLN
10.0000 [IU] | Freq: Two times a day (BID) | SUBCUTANEOUS | Status: DC
  Administered 2020-01-27 – 2020-01-28 (×2): 10 [IU] via SUBCUTANEOUS
  Filled 2020-01-27 (×3): qty 0.1

## 2020-01-27 NOTE — Progress Notes (Addendum)
Hydrologist [facility]) Hospital Liaison: RN note    Notified by Transition of Care Manger of patient/family request for [facility] services at home after discharge. Chart and patient information under review by [facility] physician. Hospice eligibility pending currently.    Writer spoke with [name] to initiate education related to hospice philosophy, services and team approach to care.           verbalized understanding of information given. Per discussion, plan is for discharge to home by PTAR once medically stable and or December 24th.  Please send signed and completed DNR form home with patient/family. Patient will need prescriptions for discharge comfort medications.     DME needs have been discussed, patient currently has the following equipment in the home: wheelchair.  Patient/family requests the following DME for delivery to the home: [facility] bed with air mattress overlay, suction for airway. [place] equipment manager has been notified and will contact DME provider to arrange delivery to the home once closer to discharge.  Home address has been verified and is correct in the chart.      [name] is the family member to contact to arrange time of delivery.     [facility] aware of the above.  Please notify [facility] when patient is ready to leave the unit at discharge. (Call [phone] or [phone] after 5pm.) [facility] information and contact numbers given to [place].      Please call with any hospice related questions.     Thank you for this referral.     Clementeen Hoof, BSN, [facility] (listed on AMION under Hospice and [place] [facility] [place])  [phone]

## 2020-01-27 NOTE — Progress Notes (Signed)
Physical Therapy Session Note  Patient Details  Name: Peter [name] MRN: [id number] Date of Birth: Dec 25, 1930  Today's Date: 01/27/2020 PT Individual Time: 1000-1100 PT Individual Time Calculation (min): 60 min   Short Term Goals: Week 3:  PT Short Term Goal 1 (Week 3): Patient will perform bed mobility with mod A with use of hospital bed features. PT Short Term Goal 2 (Week 3): Patient will perform basic transfers with max A of 1 person consistently.  Skilled Therapeutic Interventions/Progress Updates:     Patient in bed asleep upon PT arrival. Patient easily aroused, but "groggy" this morning and agreeable to PT session. Patient reported moderate R foot/toe pain during session, RN made aware. PT provided repositioning, rest breaks, and distraction as pain interventions throughout session. Tube feed unhooked by RN at beginning of session, secured end of tube to the patient for safety.   [name] called at beginning of session. Discussed w/c recommendations and w/c eval set up for Monday at 1pm. His daughter stated that she would like the patient to be able to come home before Christmas, if possible. She said that she and the live in [place] would be able to come for family education from 8-11 on the 02/02/20. Discussed patient's other daughter coming in for training on 12/19, will follow-up to confirm. Patient spoke to his daughter for ~2 min, provided cues for enunciation and attention.   Lab tech arrived to take patient's blood. Worked on L attention and attempted volitional movement of L lower extremity without success. Provided PROM stretch to heel cord, as patient fell asleep during blood draw.  Therapeutic Activity: Bed Mobility: Patient performed supine to sit with max A +2. Provided verbal cues for rolling and sequencing. Transfers: Patient performed a [employer] transfer bed>TIS w/c with max A +2 and total A for board placement. Provided cues for hand placement in his lap  for safety, board placement, and head-hips relationship for proper technique and decreased assist with transfers.   Wheelchair Mobility:  Patient was transported in the [place] w/c with total A throughout session for energy conservation and time management. Took measurements for w/c dimensions to determine patient needs at d/c if family can receive a loaner chair. Also, adjusted patient's head rest on current TIS for improved positioning.  Wheelchair measurements: Seat width: 18" Seat Depth: 18" Seat height: 19" Seat back height: 24" Will need adjustable head rest due to poor head control and decreased arousal, requires tilt feature for sitting tolerance, pressure relief, and safe positioning, needs Roho cushion for pressure relief due to stage 2 skin breakdown on sacrum, recommend full lap tray for upper extremity support and functional tasks, noted 1.5 cm inferior subluxation at L glenohumeral joint, and with require a contoured back support with lateral supports to accommodate kyphotic posture and improved positioning in the w/c.  Patient in [place] w/c in the room at end with full lap tray in place with pillow for elevation of L arm of session with breaks locked and all needs within reach.    Therapy Documentation Precautions:  Precautions Precautions: Fall Precaution Comments: hx syncope, L lean, hx dementia Restrictions Weight Bearing Restrictions: No   Therapy/Group: Individual Therapy  Kaylon Hitz L Harmony Sandell PT, DPT  01/27/2020, 12:17 PM

## 2020-01-27 NOTE — Progress Notes (Signed)
Speech Language Pathology Daily Session Note  Patient Details  Name: Peter [name] MRN: [id number] Date of Birth: [birth year]  Today's Date: 01/27/2020 SLP Individual Time: 3382-5053 SLP Individual Time Calculation (min): 55 min  Short Term Goals: Week 3: SLP Short Term Goal 1 (Week 3): Patient will consume current diet with minimal overt s/s of aspiration and mod A verbal cues for use of swallowing compensatory strategies (initate swallow, reduce oral holding, anterior spillage...etc.) SLP Short Term Goal 2 (Week 3): Pt will demonstrate sustained attention to functional tasks for 10 minutes with Mod A verbal cues for redirection. SLP Short Term Goal 3 (Week 3): Pt will recall daily and novel events with mod A verbal cues for use of external aids. SLP Short Term Goal 4 (Week 3): Pt will demonstrate use of speech intelligibility skills at the phrase level with 60% accuarcy with mod A verbal cues.  Skilled Therapeutic Interventions: Skilled treatment session focused on dysphagia and cognitive goals. Upon arrival, patient was awake in bed. Patient's tube feeding had become dislodged and flowing down onto the patient's back. Patient required total A for changing of clothes.  Patient had a large amount of dried secretions in his oral cavity that he was able to orally expectorate and that was removed with oral care. Patient consumed ice chips and small sips of thin liquids without overt s/s of aspiration. However, patient required Max verbal cues for a lip seal around the spoon and did best with the cue "tighten your lips like you're playing the trumpet." Prolonged AP transit was also noted. Recommend ongoing trials with SLP. Patient required Max verbal cues for sustained attention to task due to verbosity and perseveration on asking "why" in regards to a variety of topics. Mod verbal cues were also needed to follow commands. Patient left upright in bed with RN present. Continue with current plan of care.       Pain Pain Assessment Pain Scale: 0-10 Pain Score: 2  Pain Type: Chronic pain Pain Location: Back Pain Orientation: Mid Pain Descriptors / Indicators: Constant Pain Frequency: Constant Pain Onset: On-going Pain Intervention(s): Medication (See eMAR)  Therapy/Group: Individual Therapy  Peter [name] 01/27/2020, 12:23 PM

## 2020-01-27 NOTE — Progress Notes (Signed)
Iv was just placed about 57min ago and pt removed. Request IV team to place another IV

## 2020-01-27 NOTE — Progress Notes (Signed)
Pt has one more therapy left today. Will request new IV once therapy is complete.

## 2020-01-27 NOTE — Progress Notes (Signed)
Occupational Therapy Session Note  Patient Details  Name: Peter [name] MRN: [id number] Date of Birth: 1930/08/28  Today's Date: 01/27/2020 OT Individual Time: 1520-1609 OT Individual Time Calculation (min): 49 min    Short Term Goals: Week 1:  OT Short Term Goal 1 (Week 1): Pt will transfer to toilet/BSC with Max A of 1 OT Short Term Goal 1 - Progress (Week 1): Not met OT Short Term Goal 2 (Week 1): Pt will static stand with no more than Mod A during ADL OT Short Term Goal 2 - Progress (Week 1): Not met OT Short Term Goal 3 (Week 1): Pt will perform UB/LB bathing with Mod A with AE PRN OT Short Term Goal 3 - Progress (Week 1): Not met OT Short Term Goal 4 (Week 1): Pt will perform sit to stand Mod A to decrease BOC OT Short Term Goal 4 - Progress (Week 1): Not met  Skilled Therapeutic Interventions/Progress Updates:     1;1. Pt received in TIS agreeable to OT awake and alert. Pt reporting difficulty coping with changes in function, loss of independence and frequently verbalized irritation with "bad speech." Pt also reports feeling like he bit his cheek. RN aware. Pt provided with supprt and encouragement with therapuetic use of self to validate pt feeling and gently educate on CVA implications. Pt agreeable to going outside for change of scenery and mood support. OT dons pull over shirt with total A d/t time constraints. Pt provided with blankets as pt often is cold. Pt selects sunny spot to sit outside while OT educates on positioning to prevent further subluxation of LUE and provides soft tissue mobilization of B upper traps. Pt reports liking being outside. Upon return to room, pt requires MAX A of 2 to return to bed d/t pt scooting off beezy circle piece. Exited session with pt seated in bed, music playing, exit alarm on and call light in reach   Therapy Documentation Precautions:  Precautions Precautions: Fall Precaution Comments: hx syncope, L lean, hx  dementia Restrictions Weight Bearing Restrictions: No General:   Vital Signs:  Pain: Pain Assessment Pain Scale: 0-10 Pain Score: 2  Pain Type: Chronic pain Pain Location: Back Pain Orientation: Mid Pain Descriptors / Indicators: Constant Pain Frequency: Constant Pain Onset: On-going Pain Intervention(s): Medication (See eMAR) ADL: ADL Upper Body Dressing: Maximal assistance Lower Body Dressing: Dependent Where Assessed-Lower Body Dressing: Edge of bed Toileting: Dependent Where Assessed-Toileting: Bed level Vision   Perception    Praxis   Exercises:   Other Treatments:     Therapy/Group: Individual Therapy  Tonny Branch 01/27/2020, 12:45 PM

## 2020-01-27 NOTE — Progress Notes (Signed)
[facility] PHYSICAL MEDICINE & REHABILITATION PROGRESS NOTE   Subjective/Complaints: SLP in room. Pt without new complaints. NGT inserted and seems to be tolerating. Unfortunately the tubing became disconnected and the formula dripped down onto the bed and behind his back. Seems to have slept fairly well and to be in good spirits.  ROS: Limited due to cognitive/behavioral   Objective:   No results found. Recent Labs    01/25/20 0456 01/27/20 0509  WBC 11.9* 10.9*  HGB 7.7* 7.4*  HCT 23.3* 23.7*  PLT 339 327   Recent Labs    01/25/20 0456 01/27/20 0509  NA 141 141  K 3.7 3.9  CL 110 112*  CO2 21* 21*  GLUCOSE 120* 243*  BUN 39* 35*  CREATININE 1.73* 1.42*  CALCIUM 8.3* 8.2*    Intake/Output Summary (Last 24 hours) at 01/27/2020 0950 Last data filed at 01/27/2020 0355 Gross per 24 hour  Intake 1121.09 ml  Output --  Net 1121.09 ml    Physical Exam: Vital Signs Blood pressure (!) 177/44, pulse 68, temperature 97.6 F (36.4 C), resp. rate 18, height 5\' 7"  (1.702 m), weight 69.2 kg, SpO2 99 %. Constitutional: No distress . Vital signs reviewed. HEENT: creamy coating on tongue, almost looks like TF. NGT in place Neck: supple Cardiovascular: RRR without murmur. No JVD    Respiratory/Chest: CTA Bilaterally without wheezes or rales, ?decreased sounds in right base but not taking a really deep breath, talking while I listened. Normal effort    GI/Abdomen: BS +, non-tender, non-distended Ext: no clubbing, cyanosis, or edema Psych: pleasant and cooperative Skin: redness at sacrum. Necrotic right 2nd toe without change Musc: No edema in extremities.  No tenderness in extremities. Intrinsic muscle atrophy bilateral hands Neuro: fairly alert at times. Dozed off while we were talking this morning (it was early). Motor: RUE/RLE: 4-4+ /5 proximal distal LUE: 0-tr/5 proximal with inattention LLE: 1 to 2/5 proximal to distal with cueing d/t inattention  Assessment/Plan: 1.  Functional deficits which require 3+ hours per day of interdisciplinary therapy in a comprehensive inpatient rehab setting.  Physiatrist is providing close team supervision and 24 hour management of active medical problems listed below.  Physiatrist and rehab team continue to assess barriers to discharge/monitor patient progress toward functional and medical goals  Care Tool:  Bathing    Body parts bathed by patient: Right arm,Left arm,Chest,Abdomen,Front perineal area,Right upper leg,Left upper leg,Face   Body parts bathed by helper: Buttocks,Right lower leg,Left lower leg     Bathing assist Assist Level: Maximal Assistance - Patient 24 - 49%     Upper Body Dressing/Undressing Upper body dressing   What is the patient wearing?: Pull over shirt    Upper body assist Assist Level: Maximal Assistance - Patient 25 - 49% (sitting EOB)    Lower Body Dressing/Undressing Lower body dressing      What is the patient wearing?: Pants     Lower body assist Assist for lower body dressing: Maximal Assistance - Patient 25 - 49%     Toileting Toileting    Toileting assist Assist for toileting: Dependent - Patient 0% (3 helpers)     Transfers Chair/bed transfer  Transfers assist     Chair/bed transfer assist level: Maximal Assistance - Patient 25 - 49% (with Beezy board)     Locomotion Ambulation   Ambulation assist      Assist level: 2 helpers Assistive device: Parallel bars Max distance: 6 ft   Walk 10 feet activity  Assist     Assist level: 2 helpers Assistive device: Hand held assist   Walk 50 feet activity   Assist Walk 50 feet with 2 turns activity did not occur: Safety/medical concerns  Assist level: 2 helpers Assistive device: Hand held assist    Walk 150 feet activity   Assist Walk 150 feet activity did not occur: Safety/medical concerns         Walk 10 feet on uneven surface  activity   Assist Walk 10 feet on uneven surfaces  activity did not occur: Safety/medical concerns         Wheelchair     Assist Will patient use wheelchair at discharge?: No             Wheelchair 50 feet with 2 turns activity    Assist            Wheelchair 150 feet activity     Assist          Blood pressure (!) 177/44, pulse 68, temperature 97.6 F (36.4 C), resp. rate 18, height 5\' 7"  (1.702 m), weight 69.2 kg, SpO2 99 %.    Medical Problem List and Plan: 1.  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 Involvement of right frontal lobe too.  Appreciate neurology recs.  -currently 12/30 is DC date   -appreciate neurology help.   -Continue CIR therapies as tolerated  -family conference held 12/14 to discuss patients current status, goals, discharge needs, prognosis, answer questions, etc. Patient's daughters attended in addition to palliative care.   -continue therapies as tolerated 2.  Antithrombotics: -DVT/anticoagulation: SCDs             -antiplatelet therapy: Aspirin 81 mg daily and Brilinta 90 mg twice daily x3 months then Brilinta alone   -neurology recommends the above regimen.  3. Pain Management: Neurontin 300 mg twice daily, Cymbalta 30 mg daily.   -has pain in right foot at night which is ongoing  - scheduled tylenol 4. Mood: Namenda 10 mg twice daily, Aricept 10 mg daily             -antipsychotic agents: N/A  -12/16 now that NGT is in, I'll increase ritalin to 10mg  5. Neuropsych: This patient is capable of making decisions on his own behalf. 6. Skin/Wound Care: Routine skin checks  -kerlix wrap  for right foot to keep ischemic toe from catching  Continue pressure relief for coccyx ulcer 7. Fluids/Electrolytes/Nutrition: family/staff to encourage PO  -12/16 Cortrak inserted yesterday. He seems to be tolerating it  fairly well so far. NT's coming in to clean up after tubing separated   -will back of IVF now that we have formula running   -electrolytes look ok BMET today   -prealbumin low, 15.9 but not as bad as I feared   -continue TF/oral feeding as possible 8.  Dysphagia.    D2 nectars-->downgraded to D1 12/13 d/t lethargy--continue 9.  Hypertension.  Norvasc 10 mg daily, Coreg 6.25 mg twice daily, Cardura 4 mg daily, hydralazine 100 mg 3 times daily, HCTZ 12.5 mg daily, Imdur 60 mg daily.   -controlled Allow some elevation for perfusion 10.  History of prostate cancer.  Continue Proscar 5 mg daily. 11.  Diabetes mellitus with hyperglycemia.  Hemoglobin A1c 6.0.    lantus to 24u qam  12/16 lantus  decreased to 15u daily d/t poor, lack of intake.    -sugars are climbing d/t TF   -increase lantus to 10u BID given TF running now  12.  Diastolic congestive heart failure.  Monitor for any signs of fluid overload   Filed Weights   01/14/20 0600 01/15/20 1005 01/21/20 0300  Weight: 72.2 kg 73.1 kg 69.2 kg   12/14 weight trending down Need new weight today   -pt weighed 71.8 kg at admission to rehab  13.  History of gout.  Allopurinol 100 mg daily.  Monitor for any gout flareups 14.  Acute on chronic anemia with heme + stool.       Continue Protonix 40 mg daily  Transfused 1 unit PRBC on 12/5  Hemoglobin 8.7 on 12/6  GI saw patient on acute and deferred endo/colonoscopy unless gross, significant bleeding  12/10 Hgb sl down again to 8.3---transfuse if <7.5   -no gross blood in stool  12/13 hgb is down 7.8--> 7.7 12/14   -monitor serially   -no gross blood  12/16 hgb down to 7.4, will give another u PRBC today 15.  Hyperlipidemia.  Lipitor 16.  CAD with PCI.  Continue aspirin and Brilinta for now follow-up outpatient cardiology service.  No current plan for ischemic work-up 17.  CKD stage III.  Follow-up chemistries.  Creatinine 1.42 12/10  12/10 resuming fluids as above 18. Fever. UTI and/or  aspiration pneumonia   -UCX + for 100K klebsiella pneumoniae   -12/16 still have concerns about his chest, aspiration risk    -wbc's are stable at 10.9, afebrile.     -continue cefepime for now    -decreased BS in right base. Re-check CXR today   Greater than 35 total minutes was spent in examination of patient, assessment of pertinent data,  formulation of a treatment plan, and in discussion with patient and/or family.    LOS: 17 days A FACE TO [facility] 01/27/2020, 9:50 AM

## 2020-01-27 NOTE — Progress Notes (Addendum)
Patient ID: Peter [name], male   DOB: 02/23/1930, 86 y.o.   MRN: [id number]  SW received phone call from pt dtr [name] who has stated as a family they have decided they want to take him home with hospice with d/c date now 12/24. She has not discussed with pt yet. Family would like to maximize as much time as possible with him and no longer want him to be in the hospital. SW explained that we will reach out to [employer] to assess if hospice appropriate since they have been involved and he is their palliative care patient, and they will to determine if he is hospice appropriate. SW did reiterate that if pt is considered appropriate with hospice, all IV abx and NGT for nutrition would be discontinued. SW also reiterated that he would not be appropriate for the TIS through insurance. She said she would still like for him to have the w/c eval for the specs as her old vendor is going to adjust one for her and give to them.  She said she understands. SW informed that hospice will call to follow-up, and attending/PA may call to verify her preference as well. Fam edu with the homecare agency on 12/22 8am-11am and they are planning to move in to live with him and their mother beginning 12/24.  SW spoke with [facility] with [employer] [phone]) and she will work on this and will let me know if he is considered hospice appropriate. SW received updates from Melissa/[employer] who confirmed with dtr preference for hospice as well. Will follow-up with SW if pt is appropriate for hospice. *PER EMR, pt approved for hospice with [facility] with d/c for 12/24. They will continue to follow pt,and will arrange all of pt care needs and DME with pt dtr [name]. SW will inform [facility] when pt is ready to leave unit (Call [phone] or [phone] after 5pm).  [name], MSW, [place] Office: [phone] Cell: [phone] Fax: [phone]

## 2020-01-27 NOTE — Telephone Encounter (Signed)
Mrs Peter [name] from [facility] wanted to know if Dr. [name] would be his attending physician while having hospice care. He is being discharged on 02/04/20 want to make sure he is stable enough to go home around that time

## 2020-01-28 ENCOUNTER — Ambulatory Visit: Payer: Medicare Other | Admitting: Oncology

## 2020-01-28 ENCOUNTER — Inpatient Hospital Stay (HOSPITAL_COMMUNITY): Payer: Medicare Other | Admitting: Physical Therapy

## 2020-01-28 ENCOUNTER — Ambulatory Visit: Payer: Medicare Other

## 2020-01-28 ENCOUNTER — Inpatient Hospital Stay (HOSPITAL_COMMUNITY): Payer: Medicare Other

## 2020-01-28 LAB — TYPE AND SCREEN
ABO/RH(D): O POS
Antibody Screen: NEGATIVE
Unit division: 0

## 2020-01-28 LAB — BPAM RBC
Blood Product Expiration Date: 202201132359
ISSUE DATE / TIME: 202112162214
Unit Type and Rh: 5100

## 2020-01-28 LAB — GLUCOSE, CAPILLARY
Glucose-Capillary: 246 mg/dL — ABNORMAL HIGH (ref 70–99)
Glucose-Capillary: 118 mg/dL — ABNORMAL HIGH (ref 70–99)
Glucose-Capillary: 123 mg/dL — ABNORMAL HIGH (ref 70–99)
Glucose-Capillary: 280 mg/dL — ABNORMAL HIGH (ref 70–99)

## 2020-01-28 LAB — CULTURE, BLOOD (ROUTINE X 2)
Culture: NO GROWTH
Culture: NO GROWTH
Special Requests: ADEQUATE

## 2020-01-28 MED ORDER — WHITE PETROLATUM EX OINT
TOPICAL_OINTMENT | CUTANEOUS | Status: AC
  Administered 2020-01-28: 0.2
  Filled 2020-01-28: qty 28.35

## 2020-01-28 MED ORDER — SULFAMETHOXAZOLE-TRIMETHOPRIM 800-160 MG PO TABS
1.0000 | ORAL_TABLET | Freq: Two times a day (BID) | ORAL | Status: DC
  Administered 2020-01-28 – 2020-02-04 (×15): 1 via ORAL
  Filled 2020-01-28 (×15): qty 1

## 2020-01-28 MED ORDER — INSULIN GLARGINE 100 UNIT/ML ~~LOC~~ SOLN
15.0000 [IU] | Freq: Two times a day (BID) | SUBCUTANEOUS | Status: DC
  Administered 2020-01-28 – 2020-01-30 (×4): 15 [IU] via SUBCUTANEOUS
  Filled 2020-01-28 (×5): qty 0.15

## 2020-01-28 NOTE — Telephone Encounter (Signed)
Spoke to Roland Earl with [facility] and relayed that Dr. Einar Pheasant will be pt's attending physician when pt is discharged from hospital.

## 2020-01-28 NOTE — Progress Notes (Signed)
Occupational Therapy Session Note  Patient Details  Name: Peter [name] MRN: [id number] Date of Birth: 05-18-1930  Today's Date: 01/28/2020 OT Individual Time: 4431-5400 OT Individual Time Calculation (min): 30 min  and Today's Date: 01/28/2020 OT Missed Time: 15 Minutes Missed Time Reason: Patient fatigue    Skilled Therapeutic Interventions/Progress Updates:    1;1. Pt alert and aroused during beginning of session. Pt slid down near foot of bed therefore OT readjusts in bed with minimal A from pt pushing through LUE. Pt with 1 finger sublux at L shoulder joint and edema in L hand. Reinforced positioning LUE for edema management with pillows on handout above pt bed. Pt agreeable to K tape for edema control and trialing estim. Pt able to feel light touch on L deltoid. Estim placed with good approximation/contration with saebo stim. No pain reported by pt. Over time pt becoming more sleepy while applying K tape to L hand. Pt audibly snoring despite loud jazz music playing in room. Trial 15 min estim unattended with skin in tact at end of session. Feel daily this may help prevent further subluxation.  Saebo Stim One 330 pulse width 35 Hz pulse rate On 8 sec/ off 8 sec Ramp up/ down 2 sec Symmetrical Biphasic wave form  Max intensity 176mA at 500 Ohm load  Exited session with pt seated in bed, exit alarm on and call light in reach   Therapy Documentation Precautions:  Precautions Precautions: Fall Precaution Comments: hx syncope, L lean, hx dementia Restrictions Weight Bearing Restrictions: No General:   Vital Signs: Therapy Vitals Temp: 98.7 F (37.1 C) Pulse Rate: 64 Resp: 19 BP: (!) 156/52 Patient Position (if appropriate): Lying Oxygen Therapy SpO2: 97 % O2 Device: Room Air Pain: Pain Assessment Pain Score: Asleep ADL: ADL Upper Body Dressing: Maximal assistance Lower Body Dressing: Dependent Where Assessed-Lower Body Dressing: Edge of bed Toileting:  Dependent Where Assessed-Toileting: Bed level Vision   Perception    Praxis   Exercises:   Other Treatments:     Therapy/Group: Individual Therapy  Tonny Branch 01/28/2020, 6:58 AM

## 2020-01-28 NOTE — Progress Notes (Signed)
Speech Language Pathology Daily Session Note  Patient Details  Name: Peter [name] MRN: [id number] Date of Birth: Jun 10, 1930  Today's Date: 01/28/2020 SLP Individual Time: 0930-1000 SLP Individual Time Calculation (min): 30 min  Short Term Goals: Week 3: SLP Short Term Goal 1 (Week 3): Patient will consume current diet with minimal overt s/s of aspiration and mod A verbal cues for use of swallowing compensatory strategies (initate swallow, reduce oral holding, anterior spillage...etc.) SLP Short Term Goal 2 (Week 3): Pt will demonstrate sustained attention to functional tasks for 10 minutes with Mod A verbal cues for redirection. SLP Short Term Goal 3 (Week 3): Pt will recall daily and novel events with mod A verbal cues for use of external aids. SLP Short Term Goal 4 (Week 3): Pt will demonstrate use of speech intelligibility skills at the phrase level with 60% accuarcy with mod A verbal cues.  Skilled Therapeutic Interventions: Skilled treatment session focused on dysphagia goals. Pt HOB raised to aid in speech intelligibility and safety for PO trials. Pt states "my speech is really bad today" and "I can't even understand myself". Pt unable to independently recall compensatory strategies to increase intelligibility, SLP reviewing "SLOP" technique with visual aid assistance. Pt benefits from Mod A verbal cues throughout session to utilize strategies. Pt agreeable to oral care completed by SLP for time and PO trial ice chips. Pt consuming ice chips with decreased oral prep/transit, overt s/s aspiration (immediate wet cough) on 1/10 trials. Pt requiring consistent cues throughout not to talk with ice chip in mouth. Pt benefits from verbal cues for effortful swallow across trials. Pt left in bed with alarm on a call light within reach. Cont ST POC.   Pain Pain Assessment Pain Scale: 0-10 Pain Score: 0-No pain  Therapy/Group: Individual Therapy  Dewaine Conger 01/28/2020, 10:37 AM

## 2020-01-28 NOTE — Progress Notes (Signed)
3Physical Therapy Session Note  Patient Details  Name: Peter [name] MRN: [id number] Date of Birth: 06/15/1930  Today's Date: 01/28/2020 PT Individual Time: 1440-1530 PT Individual Time Calculation (min): 50 min   and  Today's Date: 01/28/2020 PT Missed Time: 10 Minutes Missed Time Reason: Patient fatigue  Short Term Goals: Week 3:  PT Short Term Goal 1 (Week 3): Patient will perform bed mobility with mod A with use of hospital bed features. PT Short Term Goal 2 (Week 3): Patient will perform basic transfers with max A of 1 person consistently.  Skilled Therapeutic Interventions/Progress Updates:    Pt received supine in bed, awake and agreeable to therapy session. Tube feds held during therapy session. Pt agreeable to transition to sitting EOB but declines transfer to w/c. Noted L LE resting in excessive external rotation therefore performed L PROM stretching of hip, knee, and ankle in all planes of movement x10 reps with 2x1 minute ankle DF stretch. Supine>sitting L EOB with +2 total assist for L hemibody management, B LE management off EOB, and trunk upright via logroll technique to increase pt independence and participation - cuing for increased use of R UE and R LE to assist with the rolling. Air mattress deflated for safety while sitting EOB. Tolerated sitting EOB ~39minutes with B LE supported on 8" step stool due to height of bed while working on trunk control as pt demonstrates significant posterior lean with minimal activation to correct and bring trunk forward towards midline, facilitated and cuing to lean forward with forearm support on knees with pt progressing to static sitting in this position with CGA for safety. Attempted to return to upright sitting in midline but continues to regress to posterior trunk lean requiring max to total assist to prevent LOB. Pt reports onset of abnormal sensation of "tightness" in R face, lateral to the eye near temple - reports this feeling started  occurring earlier today and he believes it happens when he moves his neck in a certain way therefore returned to supine to allow increased neck support. Sit>supine via reverse logroll technique with +2 max assist for trunk descent and B LE management onto bed. Scoot towards [facility] with +2 dependent assist via bed pads. Rolling R with total assist and L with max assist to adjust bed pads - cuing throughout for sequencing and increased use of R hemibody to assist with movement, total assist for L hemibody management. Pt requesting a drink - HOB elevated >30degrees provided nectar thick apple juice 1 spoon at a time ensuring full swallow, no signs of aspiration and pt remained alert throughout this while therapist engaged pt in L attention to complete task. Therapist educated pt on importance of supporting L UE on pillows while in bed - demonstrated technique. Pt received a phone call, requesting to answer it, and during quickly started to dowse off. Pt left supine in bed with HOB elevated >30degrees, lines intact, bed inflated, tube feeds restarted, and needs in reach. Missed 10 minutes of skilled physical therapy.  Therapy Documentation Precautions:  Precautions Precautions: Fall Precaution Comments: hx syncope, L lean, hx dementia Restrictions Weight Bearing Restrictions: No  Pain: Reports R foot pain - provided repositioning and careful movement of that LE during mobility tasks for pain management.  Therapy/Group: Individual Therapy  Tawana Scale , PT, DPT, CSRS  01/28/2020, 12:35 PM

## 2020-01-28 NOTE — Progress Notes (Signed)
[place] PHYSICAL MEDICINE & REHABILITATION PROGRESS NOTE   Subjective/Complaints: Pt drinking some water when I arrived. Tells me he's comfortable. Tolerated blood transfusion without any major issues.  ROS: Limited due to cognitive/behavioral   Objective:   DG CHEST PORT 1 VIEW  Result Date: 01/27/2020 CLINICAL DATA:  Cough EXAM: PORTABLE CHEST 1 VIEW COMPARISON:  January 23, 2020 FINDINGS: No edema or airspace opacity. Heart size and pulmonary vascularity are normal. No adenopathy. There is a loop recorder on the left. Enteric tube tip is below the diaphragm. Coronary stent noted on the left. IMPRESSION: No edema or consolidation. Enteric tube tip below diaphragm. Heart size normal. Electronically Signed   By: Lowella Grip III M.D.   On: 01/27/2020 12:37   Recent Labs    01/27/20 0509  WBC 10.9*  HGB 7.4*  HCT 23.7*  PLT 327   Recent Labs    01/27/20 0509  NA 141  K 3.9  CL 112*  CO2 21*  GLUCOSE 243*  BUN 35*  CREATININE 1.42*  CALCIUM 8.2*    Intake/Output Summary (Last 24 hours) at 01/28/2020 1129 Last data filed at 01/28/2020 0853 Gross per 24 hour  Intake 315 ml  Output --  Net 315 ml    Physical Exam: Vital Signs Blood pressure (!) 156/52, pulse 64, temperature 98.7 F (37.1 C), resp. rate 19, height 5\' 7"  (1.702 m), weight 69.2 kg, SpO2 97 %. Constitutional: No distress . Vital signs reviewed. HEENT: EOMI, oral membranes moist, NGT Neck: supple Cardiovascular: RRR without murmur. No JVD    Respiratory/Chest: CTA Bilaterally without wheezes or rales. Normal effort    GI/Abdomen: BS +, non-tender, non-distended Ext: no clubbing, cyanosis, or edema Psych: pleasant and cooperative Skin: redness at sacrum. Necrotic right 2nd toe unchanged in appearance Musc: No edema in extremities.  No tenderness in extremities. Intrinsic muscle atrophy bilateral hands Neuro: alert today, speech dysarthric. Left central 7, visual field cut and inattention.  Motor: RUE/RLE: 4-4+ /5 proximal distal LUE: 0-tr/5 proximal with inattention. Impaired pain sense LLE: 1 to 2/5 proximal to distal with cueing d/t inattention  Assessment/Plan: 1. Functional deficits which require 3+ hours per day of interdisciplinary therapy in a comprehensive inpatient rehab setting.  Physiatrist is providing close team supervision and 24 hour management of active medical problems listed below.  Physiatrist and rehab team continue to assess barriers to discharge/monitor patient progress toward functional and medical goals  Care Tool:  Bathing    Body parts bathed by patient: Right arm,Left arm,Chest,Abdomen,Front perineal area,Right upper leg,Left upper leg,Face   Body parts bathed by helper: Buttocks,Right lower leg,Left lower leg     Bathing assist Assist Level: Maximal Assistance - Patient 24 - 49%     Upper Body Dressing/Undressing Upper body dressing   What is the patient wearing?: Pull over shirt    Upper body assist Assist Level: Maximal Assistance - Patient 25 - 49% (sitting EOB)    Lower Body Dressing/Undressing Lower body dressing      What is the patient wearing?: Pants     Lower body assist Assist for lower body dressing: Maximal Assistance - Patient 25 - 49%     Toileting Toileting    Toileting assist Assist for toileting: Dependent - Patient 0% (3 helpers)     Transfers Chair/bed transfer  Transfers assist     Chair/bed transfer assist level: Maximal Assistance - Patient 25 - 49% (with [employer])     Locomotion Ambulation   Ambulation assist  Assist level: 2 helpers Assistive device: Parallel bars Max distance: 6 ft   Walk 10 feet activity   Assist     Assist level: 2 helpers Assistive device: Hand held assist   Walk 50 feet activity   Assist Walk 50 feet with 2 turns activity did not occur: Safety/medical concerns  Assist level: 2 helpers Assistive device: Hand held assist    Walk 150 feet  activity   Assist Walk 150 feet activity did not occur: Safety/medical concerns         Walk 10 feet on uneven surface  activity   Assist Walk 10 feet on uneven surfaces activity did not occur: Safety/medical concerns         Wheelchair     Assist Will patient use wheelchair at discharge?: No             Wheelchair 50 feet with 2 turns activity    Assist            Wheelchair 150 feet activity     Assist          Blood pressure (!) 156/52, pulse 64, temperature 98.7 F (37.1 C), resp. rate 19, height 5\' 7"  (1.702 m), weight 69.2 kg, SpO2 97 %.    Medical Problem List and Plan: 1.  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 Involvement of right frontal lobe too.  Appreciate neurology recs.  -family opting for home with hospice on 12/24. Will work on improving nutritional status, family ed and preparing patient/family for home. Outpatient hospice in involved with case 2.  Antithrombotics: -DVT/anticoagulation: SCDs             -antiplatelet therapy: Aspirin 81 mg daily and Brilinta 90 mg twice daily x3 months then Brilinta alone   -neurology recommends the above regimen.  3. Pain Management: Neurontin 300 mg twice daily, Cymbalta 30 mg daily.   -has pain in right foot at night which is ongoing  - scheduled tylenol 4. Mood: Namenda 10 mg twice daily, Aricept 10 mg daily             -antipsychotic agents: N/A  -12/17 continue ritalin at 10mg  for arousal, initiation 5. Neuropsych: This patient is capable of making decisions on his own behalf. 6. Skin/Wound Care: Routine skin checks  -kerlix wrap  for right foot to keep ischemic toe from catching  Continue pressure relief for coccyx ulcer 7. Fluids/Electrolytes/Nutrition: family/staff to encourage PO  -12/17 Cortrak inserted  12/15. Tolerating reasonably well    -recent electrolytes ok---recheck Monday   -prealbumin low (pre-TF), 15.9, but not as bad as I feared   -continue TF/oral feeding as possible 8.  Dysphagia.    D2 nectars-->downgraded to D1 12/13 d/t lethargy--continue 9.  Hypertension.  Norvasc 10 mg daily, Coreg 6.25 mg twice daily, Cardura 4 mg daily, hydralazine 100 mg 3 times daily, HCTZ 12.5 mg daily, Imdur 60 mg daily.   -controlled Allow some elevation for perfusion 10.  History of prostate cancer.  Continue Proscar 5 mg daily. 11.  Diabetes mellitus with hyperglycemia.  Hemoglobin A1c 6.0.    lantus to 24u qam  12/16 lantus decreased to 15u daily d/t poor, lack of intake.    -sugars are climbing d/t TF   -increase lantus to 10u BID given TF   12/17 CBG's still  elevated. Increase lantus to 15u bid while TF running  12.  Diastolic congestive heart failure.  Monitor for any signs of fluid overload   Filed Weights   01/14/20 0600 01/15/20 1005 01/21/20 0300  Weight: 72.2 kg 73.1 kg 69.2 kg   12/14 weight trending down Need new weight today   -pt weighed 71.8 kg at admission to rehab  13.  History of gout.  Allopurinol 100 mg daily.  Monitor for any gout flareups 14.  Acute on chronic anemia with heme + stool.       --transfuse if <7.5  -Continue Protonix 40 mg daily  Transfused 1 unit PRBC on 12/5  Hemoglobin 8.7 on 12/6  GI saw patient on acute and deferred endo/colonoscopy unless gross, significant bleeding  -no gross blood in stool  12/16 transfused 1u for hgb 7.4  12/17 recheckc cbc Monday  15.  Hyperlipidemia.  Lipitor 16.  CAD with PCI.  Continue aspirin and Brilinta for now follow-up outpatient cardiology service.  No current plan for ischemic work-up 17.  CKD stage III.  Follow-up chemistries.  Creatinine 1.42 12/10  12/10 resuming fluids as above 18. Fever. UTI and/or aspiration pneumonia   -UCX + for 100K klebsiella pneumoniae   -12/16 still have concerns about his chest,  aspiration risk    -wbc's are stable at 10.9, afebrile.     -continue cefepime for now   12/17 afebrile, cxr without active disease 12/16    -dc cefepime---> bactrim ds bid       LOS: 18 days A FACE TO FACE EVALUATION WAS PERFORMED  Meredith Staggers 01/28/2020, 11:29 AM

## 2020-01-28 NOTE — Progress Notes (Signed)
Blood transfusion completed. Pt tolerated well. No adverse reaction.

## 2020-01-29 ENCOUNTER — Inpatient Hospital Stay (HOSPITAL_COMMUNITY): Payer: Medicare Other | Admitting: Physical Therapy

## 2020-01-29 ENCOUNTER — Inpatient Hospital Stay (HOSPITAL_COMMUNITY): Payer: Medicare Other | Admitting: Occupational Therapy

## 2020-01-29 ENCOUNTER — Inpatient Hospital Stay (HOSPITAL_COMMUNITY): Payer: Medicare Other | Admitting: Speech Pathology

## 2020-01-29 LAB — GLUCOSE, CAPILLARY
Glucose-Capillary: 241 mg/dL — ABNORMAL HIGH (ref 70–99)
Glucose-Capillary: 159 mg/dL — ABNORMAL HIGH (ref 70–99)
Glucose-Capillary: 151 mg/dL — ABNORMAL HIGH (ref 70–99)
Glucose-Capillary: 250 mg/dL — ABNORMAL HIGH (ref 70–99)
Glucose-Capillary: 269 mg/dL — ABNORMAL HIGH (ref 70–99)

## 2020-01-29 NOTE — Progress Notes (Signed)
[place] PHYSICAL MEDICINE & REHABILITATION PROGRESS NOTE   Subjective/Complaints:  Pt reports no issues-  Wants fingernails cut, because they are dirty,    KKX:[id number] due to cognition  Objective:   No results found. Recent Labs    01/27/20 0509  WBC 10.9*  HGB 7.4*  HCT 23.7*  PLT 327   Recent Labs    01/27/20 0509  NA 141  K 3.9  CL 112*  CO2 21*  GLUCOSE 243*  BUN 35*  CREATININE 1.42*  CALCIUM 8.2*    Intake/Output Summary (Last 24 hours) at 01/29/2020 1404 Last data filed at 01/29/2020 0900 Gross per 24 hour  Intake 50 ml  Output 350 ml  Net -300 ml    Physical Exam: Vital Signs Blood pressure (!) 109/43, pulse (!) 52, temperature 97.6 F (36.4 C), temperature source Oral, resp. rate 16, height 5\' 7"  (1.702 m), weight 69.2 kg, SpO2 99 %. Constitutional: sitting up in bed- confused, NAD HEENT: EOMI, oral membranes moist, NGT/cortrak in place Neck: supple Cardiovascular: bradycardic- regular rhythm    Respiratory/Chest: CTA B/L- no W/R/R- good air movement GI/Abdomen: Soft, NT, ND, (+)BS  Ext: no clubbing, cyanosis, or edema Psych: pleasant and cooperative- smiling some Skin: redness at sacrum. Necrotic right 2nd toe unchanged in appearance- fingernails a little dirty and longer Musc: No edema in extremities.  No tenderness in extremities. Intrinsic muscle atrophy bilateral hands Neuro: alert today, speech dysarthric. Left central 7, visual field cut and inattention. Motor: RUE/RLE: 4-4+ /5 proximal distal LUE: 0-tr/5 proximal with inattention. Impaired pain sense LLE: 1 to 2/5 proximal to distal with cueing d/t inattention  Assessment/Plan: 1. Functional deficits which require 3+ hours per day of interdisciplinary therapy in a comprehensive inpatient rehab setting.  Physiatrist is providing close team supervision and 24 hour management of active medical problems listed below.  Physiatrist and rehab team continue to assess barriers to  discharge/monitor patient progress toward functional and medical goals  Care Tool:  Bathing    Body parts bathed by patient: Right arm,Left arm,Chest,Abdomen,Front perineal area,Right upper leg,Left upper leg,Face   Body parts bathed by helper: Buttocks,Right lower leg,Left lower leg     Bathing assist Assist Level: Maximal Assistance - Patient 24 - 49%     Upper Body Dressing/Undressing Upper body dressing   What is the patient wearing?: Pull over shirt    Upper body assist Assist Level: Maximal Assistance - Patient 25 - 49% (sitting EOB)    Lower Body Dressing/Undressing Lower body dressing      What is the patient wearing?: Pants     Lower body assist Assist for lower body dressing: Maximal Assistance - Patient 25 - 49%     Toileting Toileting    Toileting assist Assist for toileting: Dependent - Patient 0% (3 helpers)     Transfers Chair/bed transfer  Transfers assist     Chair/bed transfer assist level: Maximal Assistance - Patient 25 - 49% (with Bee[employer] board)     Locomotion Ambulation   Ambulation assist      Assist level: 2 helpers Assistive device: Parallel bars Max distance: 6 ft   Walk 10 feet activity   Assist     Assist level: 2 helpers Assistive device: Hand held assist   Walk 50 feet activity   Assist Walk 50 feet with 2 turns activity did not occur: Safety/medical concerns  Assist level: 2 helpers Assistive device: Hand held assist    Walk 150 feet activity   Assist Walk 150  feet activity did not occur: Safety/medical concerns         Walk 10 feet on uneven surface  activity   Assist Walk 10 feet on uneven surfaces activity did not occur: Safety/medical concerns         Wheelchair     Assist Will patient use wheelchair at discharge?: No             Wheelchair 50 feet with 2 turns activity    Assist            Wheelchair 150 feet activity     Assist          Blood pressure (!)  109/43, pulse (!) 52, temperature 97.6 F (36.4 C), temperature source Oral, resp. rate 16, height 5\' 7"  (1.702 m), weight 69.2 kg, SpO2 99 %.    Medical Problem List and Plan: 1.  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 Involvement of right frontal lobe too.  Appreciate neurology recs.  -family opting for home with hospice on 12/24. Will work on improving nutritional status, family ed and preparing patient/family for home. Outpatient hospice in involved with case 2.  Antithrombotics: -DVT/anticoagulation: SCDs             -antiplatelet therapy: Aspirin 81 mg daily and Brilinta 90 mg twice daily x3 months then Brilinta alone   -neurology recommends the above regimen.  3. Pain Management: Neurontin 300 mg twice daily, Cymbalta 30 mg daily.   -has pain in right foot at night which is ongoing  - scheduled tylenol 4. Mood: Namenda 10 mg twice daily, Aricept 10 mg daily             -antipsychotic agents: N/A  -12/17 continue ritalin at 10mg  for arousal, initiation 5. Neuropsych: This patient is? capable of making decisions on his own behalf. 6. Skin/Wound Care: Routine skin checks  -kerlix wrap  for right foot to keep ischemic toe from catching  Continue pressure relief for coccyx ulcer 7. Fluids/Electrolytes/Nutrition: family/staff to encourage PO  -12/17 Cortrak inserted 12/15. Tolerating reasonably well    -recent electrolytes ok---recheck Monday   -prealbumin low (pre-TF), 15.9, but not as bad as I feared   -continue TF/oral feeding as possible  1218- pt ate a little for breakfast- con't to encourage.  8.  Dysphagia.    D2 nectars-->downgraded to D1 12/13 d/t lethargy--continue 9.  Hypertension.  Norvasc 10 mg daily, Coreg 6.25 mg twice daily, Cardura 4 mg daily, hydralazine 100 mg 3 times daily, HCTZ 12.5 mg daily,  Imdur 60 mg daily.   -controlled Allow some elevation for perfusion 10.  History of prostate cancer.  Continue Proscar 5 mg daily. 11.  Diabetes mellitus with hyperglycemia.  Hemoglobin A1c 6.0.    lantus to 24u qam  12/16 lantus decreased to 15u daily d/t poor, lack of intake.    -sugars are climbing d/t TF   -increase lantus to 10u BID given TF   12/17 CBG's still elevated. Increase lantus to 15u bid while TF running  12/18- BGs 123 to 280- however just changed things- will wait til tomorrow to make changes  12.  Diastolic congestive heart failure.  Monitor for any signs of fluid overload   Filed Weights   01/14/20 0600 01/15/20 1005 01/21/20 0300  Weight: 72.2 kg 73.1 kg 69.2 kg  12/14 weight trending down Need new weight today   -pt weighed 71.8 kg at admission to rehab  13.  History of gout.  Allopurinol 100 mg daily.  Monitor for any gout flareups 14.  Acute on chronic anemia with heme + stool.       --transfuse if <7.5  -Continue Protonix 40 mg daily  Transfused 1 unit PRBC on 12/5  Hemoglobin 8.7 on 12/6  GI saw patient on acute and deferred endo/colonoscopy unless gross, significant bleeding  -no gross blood in stool  12/16 transfused 1u for hgb 7.4  12/17 recheckc cbc Monday  15.  Hyperlipidemia.  Lipitor 16.  CAD with PCI.  Continue aspirin and Brilinta for now follow-up outpatient cardiology service.  No current plan for ischemic work-up 17.  CKD stage III.  Follow-up chemistries.  Creatinine 1.42 12/10  12/10 resuming fluids as above 18. Fever. UTI and/or aspiration pneumonia   -UCX + for 100K klebsiella pneumoniae   -12/16 still have concerns about his chest, aspiration risk    -wbc's are stable at 10.9, afebrile.     -continue cefepime for now   12/17 afebrile, cxr without active disease 12/16    -dc cefepime---> bactrim ds bid       LOS: 19 days A FACE TO FACE EVALUATION WAS PERFORMED  Demian Maisel 01/29/2020, 2:04 PM

## 2020-01-29 NOTE — Progress Notes (Signed)
Speech Language Pathology Daily Session Note  Patient Details  Name: Peter [name] MRN: [id number] Date of Birth: [birth year]  Today's Date: 01/29/2020 SLP Individual Time: 1415-1500 SLP Individual Time Calculation (min): 45 min  Short Term Goals: Week 3: SLP Short Term Goal 1 (Week 3): Patient will consume current diet with minimal overt s/s of aspiration and mod A verbal cues for use of swallowing compensatory strategies (initate swallow, reduce oral holding, anterior spillage...etc.) SLP Short Term Goal 2 (Week 3): Pt will demonstrate sustained attention to functional tasks for 10 minutes with Mod A verbal cues for redirection. SLP Short Term Goal 3 (Week 3): Pt will recall daily and novel events with mod A verbal cues for use of external aids. SLP Short Term Goal 4 (Week 3): Pt will demonstrate use of speech intelligibility skills at the phrase level with 60% accuarcy with mod A verbal cues.  Skilled Therapeutic Interventions:   Patient seen by skilled ST for session focusing on swallow, speech, cognitive function. Patient's wife and a friend/caregiver present at very end of session. Patient consumed small amounts of ice chips and toothette sponges of water and one very small cup sip. He exhibited immediate cough response on 3/8 trials but voice remained clear. Patient was alert but had been lethargic and sleeping much of first half of day. During today's session, he was moderately dysarthric with SLP having to cue him to repeat, slow down, etc. Patient asked "what place is this?" and when SLP turned question back to him, he said "therapy facility". Patient required mod-max cues to look to left, but he was then able to demonstrate ability to see objects/people on left (his wheelchair, his wife, etc). Patient continues to benefit from skilled SLP intervention to maximize cognitive-linguistic, speech and swallow function prior to discharge.  Pain Pain Assessment Pain Scale: 0-10 Pain Score:  0-No pain  Therapy/Group: Individual Therapy  Sonia Baller, MA, CCC-SLP Speech Therapy

## 2020-01-29 NOTE — Progress Notes (Signed)
Occupational Therapy Session Note  Patient Details  Name: Peter [name] MRN: [id number] Date of Birth: 1930/04/13  Today's Date: 01/29/2020 OT Individual Time: 1239-3594 OT Individual Time Calculation (min): 60 min    Short Term Goals: Week 3:  OT Short Term Goal 1 (Week 3): Pt will attend to his LUE with mod cueing OT Short Term Goal 2 (Week 3): Pt will maintain static sitting balance with mod A OT Short Term Goal 3 (Week 3): Pt will complete UB bathing with mod A OT Short Term Goal 4 (Week 3): Pt will maintain appropriate arousal for 3 consecutive ADL sessions  Skilled Therapeutic Interventions/Progress Updates:    Patient greeted semi-reclined in bed awake and ready for OT treatment session. Pt reported " I have been waiting on you all night." Pt noted to be incontinent of urine, so worked on bed mobility and head turns for rolling with max A to the L and total A to the R. Pt able to assist with washing front peri-area, but needed total A to wash buttocks and don clean brief. Pt able to follow commands to lift R LE into pants, but total A for L sided dressing. Rolling in similar fashion for total A to pull up pants. Worked on coming into long sitting in bed using handrails and max A +2. +2  Needed to maintain sitting while OT assisted with washing back, then pull down shirt. Worked on self-feeding sitting in chair position in bed. Verbal cues to swallow after each bite and cues to slow rate of bites. Incorporated L visual scanning as food items placed at midline, then slightly past midline to L. Pt left in chair position in bed at end of session with L UE supported and needs met.   Therapy Documentation Precautions:  Precautions Precautions: Fall Precaution Comments: hx syncope, L lean, hx dementia Restrictions Weight Bearing Restrictions: No Pain:   denies pain  Therapy/Group: Individual Therapy  Valma Cava 01/29/2020, 8:28 AM

## 2020-01-29 NOTE — Progress Notes (Signed)
Physical Therapy Session Note  Patient Details  Name: Peter [name] MRN: [id number] Date of Birth: 06/14/1930  Today's Date: 01/29/2020 PT Individual Time: 1005-1100 PT Individual Time Calculation (min): 55 min   Short Term Goals: Week 3:  PT Short Term Goal 1 (Week 3): Patient will perform bed mobility with mod A with use of hospital bed features. PT Short Term Goal 2 (Week 3): Patient will perform basic transfers with max A of 1 person consistently.  Skilled Therapeutic Interventions/Progress Updates:   Pt received supine in bed and agreeable to PT. Supine>sit transfer with max assist and cues for attention to the LUE/LLE.   Sitting balance EOB with min assist progressing to supervision assist x 10 minutes. Min cues for proper RUE support on bed. Pt perform lateral lean to the L x 2 with mod assist to return to sitting upright to place [employer]. Beezy [facility] with total A, but pt able to utilize RUE to stabilize torso on board and arm rest as instructed throughout transfer.   Pt transferred to rehab gym in [facility]. LUE NMR to perform horizontal abduction/adduction on roller board to eliminate gravity; 2 x 15 with AAROM and max cues for attention to the L.  BLE LAQ and hip/knee flexion/extension 2 x 30 sec head with max cues for full ROM on the L and sustained attention to task.   Attention task to propell WC with the RUE x 65f and max assist to maintain straight trajectory of WC. Pt requiered max cues to attend to task for more then 10 ft at a time. [facility] and all needs met.          Therapy Documentation Precautions:  Precautions Precautions: Fall Precaution Comments: hx syncope, L lean, hx dementia Restrictions Weight Bearing Restrictions: No    Vital Signs: Therapy Vitals BP: (!) 167/47 Pain: denies   Therapy/Group: Individual Therapy  ALorie Phenix12/18/2021, 11:03 AM

## 2020-01-30 ENCOUNTER — Inpatient Hospital Stay (HOSPITAL_COMMUNITY): Payer: Medicare Other | Admitting: Occupational Therapy

## 2020-01-30 ENCOUNTER — Inpatient Hospital Stay (HOSPITAL_COMMUNITY): Payer: Medicare Other

## 2020-01-30 ENCOUNTER — Inpatient Hospital Stay (HOSPITAL_COMMUNITY): Payer: Medicare Other | Admitting: Speech Pathology

## 2020-01-30 LAB — GLUCOSE, CAPILLARY
Glucose-Capillary: 294 mg/dL — ABNORMAL HIGH (ref 70–99)
Glucose-Capillary: 197 mg/dL — ABNORMAL HIGH (ref 70–99)
Glucose-Capillary: 377 mg/dL — ABNORMAL HIGH (ref 70–99)
Glucose-Capillary: 285 mg/dL — ABNORMAL HIGH (ref 70–99)

## 2020-01-30 MED ORDER — ACETAMINOPHEN 160 MG/5ML PO SOLN
325.0000 mg | Freq: Three times a day (TID) | ORAL | Status: DC | PRN
  Administered 2020-02-03: 325 mg via ORAL
  Filled 2020-01-30: qty 20.3

## 2020-01-30 MED ORDER — INSULIN GLARGINE 100 UNIT/ML ~~LOC~~ SOLN
18.0000 [IU] | Freq: Two times a day (BID) | SUBCUTANEOUS | Status: DC
  Administered 2020-01-30 – 2020-01-31 (×2): 18 [IU] via SUBCUTANEOUS
  Filled 2020-01-30 (×5): qty 0.18

## 2020-01-30 MED ORDER — OXYCODONE HCL 5 MG/5ML PO SOLN
2.5000 mg | Freq: Three times a day (TID) | ORAL | Status: DC | PRN
Start: 2020-01-30 — End: 2020-02-04
  Administered 2020-02-03: 2.5 mg via ORAL
  Filled 2020-01-30: qty 5

## 2020-01-30 MED ORDER — TRAMADOL 5 MG/ML ORAL SUSPENSION: 50.0000 mg | Freq: Three times a day (TID) | ORAL | Status: DC | PRN

## 2020-01-30 NOTE — Progress Notes (Signed)
Physical Therapy Session Note  Patient Details  Name: Peter [name] MRN: [id number] Date of Birth: 1930/11/24  Today's Date: 01/30/2020 PT Individual Time: 1100-1200 PT Individual Time Calculation (min): 60 min   Short Term Goals: Week 3:  PT Short Term Goal 1 (Week 3): Patient will perform bed mobility with mod A with use of hospital bed features. PT Short Term Goal 2 (Week 3): Patient will perform basic transfers with max A of 1 person consistently.  Skilled Therapeutic Interventions/Progress Updates:     Patient in TIS w/c upon PT arrival. Patient alert and agreeable to PT session. Patient reported 6-7/10 R fot pain during session, RN made aware. PT provided repositioning, rest breaks, and distraction as pain interventions throughout session.   Patient maintained arousal throughout session with improved frequency and quality of speech with continued dysarthria. Patient verbose and difficulty to redirect throughout session, slowing progress with mobility, but in good spirits. Patient advocating for himself and asking appropriate questions about his care throughout session. Provided answers as able and redirected questions to RN as appropriate, who was made aware at end of session.   [name] arrived during session to participate in family training. Patient incontinent of bladder prior to mobility, focused on lift training, used Maxi-move due to challenges with the air mattress and hoyer lift. Demonstrated and cued his daughter for proper sling placement lift management and differences between manual vs electric lift. Educated on space required for use of a lift and need for space under the bed to place the patient over the bed. Positioned patient and educated on positioning and sling removal. Patient performed rolling with max A to remove the sling.   [name] stated that she would not be assisting with toileting, PT offered her the opportunity to step out while  peri-care was performed due to incontinence, she accepted. Patient performed rolling with max A to the L and total A to the R with hand over hand for R hand placement on rail to assist with rolling. Provided cues for sequencing and initiation. Performed peri-care and lower body clothing management with total A bed level.   Patient in bed with air mattress inflated at end of session with breaks locked and all needs within reach. Discussed POC, patient/family goals, and equipment needs with patient's daughter after session. Plan for continued family training this week with her sister and [name] and will discuss w/c needs following w/c evaluation tomorrow, otherwise, PT recommendations are a hospital bed and hoyer lift for patient's safety with home mobility. [name] in agreement.    Therapy Documentation Precautions:  Precautions Precautions: Fall Precaution Comments: hx syncope, L lean, hx dementia Restrictions Weight Bearing Restrictions: No   Therapy/Group: Individual Therapy  Khaleb Broz L Mandalyn Pasqua PT, DPT  01/30/2020, 4:40 PM

## 2020-01-30 NOTE — Progress Notes (Signed)
[facility] PHYSICAL MEDICINE & REHABILITATION PROGRESS NOTE   Subjective/Complaints:  Pt reports hard to be understood-  R toe "going to fall off".  Hurting/is painful.     ROS: limited due to cognition/behavior  Objective:   No results found. No results for input(s): WBC, HGB, HCT, PLT in the last 72 hours. No results for input(s): NA, K, CL, CO2, GLUCOSE, BUN, CREATININE, CALCIUM in the last 72 hours.  Intake/Output Summary (Last 24 hours) at 01/30/2020 1249 Last data filed at 01/30/2020 0351 Gross per 24 hour  Intake 125 ml  Output 275 ml  Net -150 ml    Physical Exam: Vital Signs Blood pressure (!) 170/45, pulse 75, temperature 98.1 F (36.7 C), resp. rate 17, height 5\' 7"  (1.702 m), weight 69.2 kg, SpO2 99 %. Constitutional: sitting up in bedside chair, confused, NAD HEENT: EOMI, oral membranes moist, NGT/cortrak in place still Neck: supple Cardiovascular: RRR    Respiratory/Chest: CTA B/L- no W/R/R- good air movement GI/Abdomen: Soft, NT, ND, (+)BS  Ext: no clubbing, cyanosis, or edema Psych: pleasant and cooperative- smiling some Skin: redness at sacrum. Necrotic right 2nd toe unchanged in appearance- looks the same-  Musc: No edema in extremities.  No tenderness in extremities. Intrinsic muscle atrophy bilateral hands Neuro: alert today, speech dysarthric. Left central 7, visual field cut and inattention. Motor: RUE/RLE: 4-4+ /5 proximal distal LUE: 0-tr/5 proximal with inattention. Impaired pain sense LLE: 1 to 2/5 proximal to distal with cueing d/t inattention  Assessment/Plan: 1. Functional deficits which require 3+ hours per day of interdisciplinary therapy in a comprehensive inpatient rehab setting.  Physiatrist is providing close team supervision and 24 hour management of active medical problems listed below.  Physiatrist and rehab team continue to assess barriers to discharge/monitor patient progress toward functional and medical goals  Care  Tool:  Bathing    Body parts bathed by patient: Right arm,Left arm,Chest,Abdomen,Front perineal area,Right upper leg,Left upper leg,Face   Body parts bathed by helper: Buttocks,Right lower leg,Left lower leg     Bathing assist Assist Level: Maximal Assistance - Patient 24 - 49%     Upper Body Dressing/Undressing Upper body dressing   What is the patient wearing?: Pull over shirt    Upper body assist Assist Level: Maximal Assistance - Patient 25 - 49% (sitting EOB)    Lower Body Dressing/Undressing Lower body dressing      What is the patient wearing?: Pants     Lower body assist Assist for lower body dressing: Maximal Assistance - Patient 25 - 49%     Toileting Toileting    Toileting assist Assist for toileting: Dependent - Patient 0% (3 helpers)     Transfers Chair/bed transfer  Transfers assist     Chair/bed transfer assist level: Maximal Assistance - Patient 25 - 49% (with Beezy board)     Locomotion Ambulation   Ambulation assist      Assist level: 2 helpers Assistive device: Parallel bars Max distance: 6 ft   Walk 10 feet activity   Assist     Assist level: 2 helpers Assistive device: Hand held assist   Walk 50 feet activity   Assist Walk 50 feet with 2 turns activity did not occur: Safety/medical concerns  Assist level: 2 helpers Assistive device: Hand held assist    Walk 150 feet activity   Assist Walk 150 feet activity did not occur: Safety/medical concerns         Walk 10 feet on uneven surface  activity  Assist Walk 10 feet on uneven surfaces activity did not occur: Safety/medical concerns         Wheelchair     Assist Will patient use wheelchair at discharge?: No             Wheelchair 50 feet with 2 turns activity    Assist            Wheelchair 150 feet activity     Assist          Blood pressure (!) 170/45, pulse 75, temperature 98.1 F (36.7 C), resp. rate 17, height 5\' 7"   (1.702 m), weight 69.2 kg, SpO2 99 %.    Medical Problem List and Plan: 1.  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 Involvement of right frontal lobe too.  Appreciate neurology recs.  -family opting for home with hospice on 12/24. Will work on improving nutritional status, family ed and preparing patient/family for home. Outpatient hospice in involved with case 2.  Antithrombotics: -DVT/anticoagulation: SCDs             -antiplatelet therapy: Aspirin 81 mg daily and Brilinta 90 mg twice daily x3 months then Brilinta alone   -neurology recommends the above regimen.  3. Pain Management: Neurontin 300 mg twice daily, Cymbalta 30 mg daily.   -has pain in right foot at night which is ongoing  - scheduled tylenol  12/19- will give oxycodone prn for pain- says they cannot crush tramadol/or norco- per pharmacy requirements-  4. Mood: Namenda 10 mg twice daily, Aricept 10 mg daily             -antipsychotic agents: N/A  -12/17 continue ritalin at 10mg  for arousal, initiation 5. Neuropsych: This patient is? capable of making decisions on his own behalf. 6. Skin/Wound Care: Routine skin checks  -kerlix wrap  for right foot to keep ischemic toe from catching  Continue pressure relief for coccyx ulcer 7. Fluids/Electrolytes/Nutrition: family/staff to encourage PO  -12/17 Cortrak inserted 12/15. Tolerating reasonably well    -recent electrolytes ok---recheck Monday   -prealbumin low (pre-TF), 15.9, but not as bad as I feared   -continue TF/oral feeding as possible  1218- pt ate a little for breakfast- con't to encourage.   12/19- con't TFs- not eating much 8.  Dysphagia.    D2 nectars-->downgraded to D1 12/13 d/t lethargy--continue 9.  Hypertension.  Norvasc 10 mg daily, Coreg 6.25 mg twice daily, Cardura 4 mg daily, hydralazine  100 mg 3 times daily, HCTZ 12.5 mg daily, Imdur 60 mg daily.   -controlled Allow some elevation for perfusion  12/19- BP 170/45- but hadn't received AM meds- will monitor 10.  History of prostate cancer.  Continue Proscar 5 mg daily. 11.  Diabetes mellitus with hyperglycemia.  Hemoglobin A1c 6.0.    lantus to 24u qam  12/16 lantus decreased to 15u daily d/t poor, lack of intake.    -sugars are climbing d/t TF   -increase lantus to 10u BID given TF   12/17 CBG's still elevated. Increase lantus to 15u bid while TF running  12/18- BGs 123 to 280- however just changed things- will wait til tomorrow to make changes  12/19- 151-294- increase Lantus- to 18 units BID   12.  Diastolic congestive heart failure.  Monitor for any signs of fluid overload   Filed  Weights   01/14/20 0600 01/15/20 1005 01/21/20 0300  Weight: 72.2 kg 73.1 kg 69.2 kg   12/14 weight trending down Need new weight today   -pt weighed 71.8 kg at admission to rehab  13.  History of gout.  Allopurinol 100 mg daily.  Monitor for any gout flareups 14.  Acute on chronic anemia with heme + stool.       --transfuse if <7.5  -Continue Protonix 40 mg daily  Transfused 1 unit PRBC on 12/5  Hemoglobin 8.7 on 12/6  GI saw patient on acute and deferred endo/colonoscopy unless gross, significant bleeding  -no gross blood in stool  12/16 transfused 1u for hgb 7.4  12/17 recheckc cbc Monday  15.  Hyperlipidemia.  Lipitor 16.  CAD with PCI.  Continue aspirin and Brilinta for now follow-up outpatient cardiology service.  No current plan for ischemic work-up 17.  CKD stage III.  Follow-up chemistries.  Creatinine 1.42 12/10  12/10 resuming fluids as above 18. Fever. UTI and/or aspiration pneumonia   -UCX + for 100K klebsiella pneumoniae   -12/16 still have concerns about his chest, aspiration risk    -wbc's are stable at 10.9, afebrile.     -continue cefepime for now   12/17 afebrile, cxr without active disease 12/16    -dc  cefepime---> bactrim ds bid       LOS: 20 days A FACE TO FACE EVALUATION WAS PERFORMED  Dusten Ellinwood 01/30/2020, 12:49 PM

## 2020-01-30 NOTE — Progress Notes (Addendum)
Speech Language Pathology Daily Session Note  Patient Details  Name: Peter [name] MRN: [id number] Date of Birth: 1931/02/07  Today's Date: 01/30/2020 SLP Individual Time: 3159-4585 SLP Individual Time Calculation (min): 40 min  Short Term Goals: Week 3: SLP Short Term Goal 1 (Week 3): Patient will consume current diet with minimal overt s/s of aspiration and mod A verbal cues for use of swallowing compensatory strategies (initate swallow, reduce oral holding, anterior spillage...etc.) SLP Short Term Goal 2 (Week 3): Pt will demonstrate sustained attention to functional tasks for 10 minutes with Mod A verbal cues for redirection. SLP Short Term Goal 3 (Week 3): Pt will recall daily and novel events with mod A verbal cues for use of external aids. SLP Short Term Goal 4 (Week 3): Pt will demonstrate use of speech intelligibility skills at the phrase level with 60% accuarcy with mod A verbal cues.  Skilled Therapeutic Interventions:  Pt was seen for skilled ST targeting dysphagia goals.  Pt's daughter was at bedside and feeding pt his lunch.  Pt needed intermittent cuing to clear his oral cavity due to oral holding but demonstrated no overt s/s of aspiration.  SLP clarified that pt's current diet recommendations were for nectar thick liquids via teaspoon as he was noted with a straw in his drink.  No overt s/s of aspiration noted with straw sips of nectar thick liquids but daughter verbalized understanding of current precautions and agreed to proceed with teaspoons of thickened liquids.  During meal, pt was much more talkative and interactive in comparison to previous therapy sessions.  He needed mod verbal cues to slow rate of speech and increase vocal intensity to achieve intelligibility at the short phrase level.  Following completion of meal, pt became drowsy and began to fall asleep.  Daughter presented with questions about what to do about pt's swallowing function in light of anticipated  discharge with hospice services.  SLP discussed the importance of oral hygiene for minimizing risk of infection and improving moisture of oral mucosa for improved pt comfort but deferred additional recommendations until completion of MBS planned for tomorrow morning.  All questions were answered to pt's and daughter's satisfaction at this time.  Pt was left in bed with daughter at bedside.  Continue per current plan of care.     Pain Pain Assessment Pain Scale: 0-10 Pain Score: 0-No pain  Therapy/Group: Individual Therapy  Tasheema Perrone, Selinda Orion 01/30/2020, 3:47 PM

## 2020-01-30 NOTE — Progress Notes (Signed)
Occupational Therapy Session Note  Patient Details  Name: Peter [name] MRN: [id number] Date of Birth: 11/17/1930  Today's Date: 01/30/2020 OT Individual Time: 6945-0388 OT Individual Time Calculation (min): 62 min    Short Term Goals: Week 3:  OT Short Term Goal 1 (Week 3): Pt will attend to his LUE with mod cueing OT Short Term Goal 2 (Week 3): Pt will maintain static sitting balance with mod A OT Short Term Goal 3 (Week 3): Pt will complete UB bathing with mod A OT Short Term Goal 4 (Week 3): Pt will maintain appropriate arousal for 3 consecutive ADL sessions  Skilled Therapeutic Interventions/Progress Updates:    Patient in bed, alert.  He denies pain at this time and requests a hair wash.  Patient assisted with donning pants at bed level, mod A to roll left, max/dep to roll right.  Brief dry, dependent for washing and drying groin/buttocks.  Max/dep to donn pants and pull over hips.  Utilized maxi move for transfer to w/c.  Completed hair washing with shampoo tray.  Changed OH shirt mod A.  He is able to lean forward in w/c with min A for sling placement/removal and to pull OH shirt down in back.  Completed left UE PROM and positioning.  Lap board placed, chair in tilted position, seat belt on (nursing to locate alarm for chair).  Call bell in reach at close of session.    Therapy Documentation Precautions:  Precautions Precautions: Fall Precaution Comments: hx syncope, L lean, hx dementia Restrictions Weight Bearing Restrictions: No  Therapy/Group: Individual Therapy  Carlos Levering 01/30/2020, 7:31 AM

## 2020-01-30 NOTE — Progress Notes (Signed)
[facility])  Chart and pt information have been reviewed by [facility] physician.  Hospice eligibility confirmed.  Hospital liaison spoke with [place] continue education related to hospice philosophy and services and to answer any questions at this time. [name]    verbalized understanding of information given.  Per discussion the plan is to discharge home Friday via [place].  [place] send signed and completed DNR home with pt/family.  Please provide prescriptions at discharge as needed to ensure ongoing symptom management until pt can be admitted onto hospice services.   Dtg [name] asked that DME be delivered on Wednesday.  [place] liaisons aware and will order DME on Tuesday for Wednesday delivery.  Thank you for the opportunity to participate in this pt's care.  Domenic Moras, BSN, RN [employer] [phone] (24h on call)

## 2020-01-31 ENCOUNTER — Inpatient Hospital Stay (HOSPITAL_COMMUNITY): Payer: Medicare Other

## 2020-01-31 ENCOUNTER — Inpatient Hospital Stay (HOSPITAL_COMMUNITY): Payer: Medicare Other | Admitting: Occupational Therapy

## 2020-01-31 ENCOUNTER — Encounter (HOSPITAL_COMMUNITY): Payer: Medicare Other | Admitting: Speech Pathology

## 2020-01-31 LAB — CBC
HCT: 26.8 % — ABNORMAL LOW (ref 39.0–52.0)
Hemoglobin: 8.2 g/dL — ABNORMAL LOW (ref 13.0–17.0)
MCH: 26.5 pg (ref 26.0–34.0)
MCHC: 30.6 g/dL (ref 30.0–36.0)
MCV: 86.7 fL (ref 80.0–100.0)
Platelets: 315 10*3/uL (ref 150–400)
RBC: 3.09 MIL/uL — ABNORMAL LOW (ref 4.22–5.81)
RDW: 15.8 % — ABNORMAL HIGH (ref 11.5–15.5)
WBC: 11.1 10*3/uL — ABNORMAL HIGH (ref 4.0–10.5)
nRBC: 0 % (ref 0.0–0.2)

## 2020-01-31 LAB — GLUCOSE, CAPILLARY
Glucose-Capillary: 242 mg/dL — ABNORMAL HIGH (ref 70–99)
Glucose-Capillary: 248 mg/dL — ABNORMAL HIGH (ref 70–99)
Glucose-Capillary: 240 mg/dL — ABNORMAL HIGH (ref 70–99)
Glucose-Capillary: 209 mg/dL — ABNORMAL HIGH (ref 70–99)

## 2020-01-31 LAB — BASIC METABOLIC PANEL
Anion gap: 12 (ref 5–15)
BUN: 65 mg/dL — ABNORMAL HIGH (ref 8–23)
CO2: 22 mmol/L (ref 22–32)
Calcium: 8.4 mg/dL — ABNORMAL LOW (ref 8.9–10.3)
Chloride: 109 mmol/L (ref 98–111)
Creatinine, Ser: 1.69 mg/dL — ABNORMAL HIGH (ref 0.61–1.24)
GFR, Estimated: 39 mL/min — ABNORMAL LOW (ref >60)
Glucose, Bld: 260 mg/dL — ABNORMAL HIGH (ref 70–99)
Potassium: 4.9 mmol/L (ref 3.5–5.1)
Sodium: 143 mmol/L (ref 135–145)

## 2020-01-31 MED ORDER — ONDANSETRON HCL 4 MG PO TABS
4.0000 mg | ORAL_TABLET | Freq: Three times a day (TID) | ORAL | Status: DC | PRN
  Administered 2020-01-31: 4 mg via ORAL
  Filled 2020-01-31: qty 1

## 2020-01-31 MED ORDER — TICAGRELOR 90 MG PO TABS: 90.0000 mg | ORAL_TABLET | Freq: Two times a day (BID) | ORAL | 2 refills | Status: AC

## 2020-01-31 MED ORDER — ALPRAZOLAM 0.25 MG PO TABS
0.1250 mg | ORAL_TABLET | Freq: Once | ORAL | Status: AC
  Administered 2020-01-31: 0.125 mg via ORAL
  Filled 2020-01-31: qty 1

## 2020-01-31 NOTE — Progress Notes (Addendum)
Modified Barium Swallow Progress Note  Patient Details  Name: Peter [name] MRN: [id number] Date of Birth: 04-11-1930  Today's Date: 01/31/2020  Modified Barium Swallow completed.  Full report located under Chart Review in the Imaging Section.  Brief recommendations include the following:  Clinical Impression  Patient demonstrates a moderate-severe oropharyngeal dysphagia which is further exacerbated by cognitive deficits. Patient demonstrates anterior spillage with poor bolus cohesion and prolonged AP transit with liquids. Patient with consistent swallow trigger at the velleculae with thickened and solid textures and at the pyriform sinuses with thin liquids resulting in silent aspiration.  Aspiration events were reduced to deep penetration when given small sips via tsp with patient unable to clear despite cueing. Patient with impaired mastication with semi-soft solids requiring liquid washes to clear from base of tongue. Due to patient's current cognitive deficits with limited mobility and decreased cough strength, recommend patient continue Dys. 1 textures with nectar-thick liquids via cup with small sips of water allowed via tsp between meals after thorough oral care.   Swallow Evaluation Recommendations       SLP Diet Recommendations: Dysphagia 1 (Puree) solids;Nectar thick liquid   Liquid Administration via: Cup;No straw;Spoon   Medication Administration: Crushed with puree   Supervision: Staff to assist with self feeding;Full supervision/cueing for compensatory strategies   Compensations: Slow rate;Small sips/bites;Minimize environmental distractions;Monitor for anterior loss   Postural Changes: Seated upright at 90 degrees   Oral Care Recommendations: Oral care BID   Other Recommendations: Order thickener from pharmacy;Prohibited food (jello, ice cream, thin soups);Have oral suction available;Remove water pitcher    Kjirsten Bloodgood 01/31/2020,3:06 PM

## 2020-01-31 NOTE — Progress Notes (Signed)
Occupational Therapy Note  Patient Details  Name: Peter [name] MRN: [id number] Date of Birth: 1930-11-25  Attempted to see pt for make up time, however pt at [facility]. Will follow up as able per POC.  Lowella Dell Sajan Che[place]wood 01/31/2020, 9:22 AM

## 2020-01-31 NOTE — Progress Notes (Addendum)
Physical Therapy Session Note  Patient Details  Name: Peter [name] MRN: [id number] Date of Birth: 11-05-1930  Today's Date: 01/31/2020 PT Individual Time: 1310-1400 PT Individual Time Calculation (min): 50 min   Short Term Goals: Week 3:  PT Short Term Goal 1 (Week 3): Patient will perform bed mobility with mod A with use of hospital bed features. PT Short Term Goal 2 (Week 3): Patient will perform basic transfers with max A of 1 person consistently.  Skilled Therapeutic Interventions/Progress Updates:     Patient sitting up in bed eating lunch with NT upon PT arrival. Patient alert and agreeable to PT session. Patient reported moderate R toe/foot pain during session, RN made aware. PT provided repositioning, rest breaks, and distraction as pain interventions throughout session.   Noted patient pocketing food on the L side of his mouth provided cues for performing tongue sweep to clear that side of his mouth and swallow all contents in his mouth before taking another bite. NT reported that the patient was incontinent of bladder prior to eating lunch and would need changed prior to mobility. Jason, ATP, from [facility] arrived for w/c evaluation. Stepped out to allow nursing to perform peri-care due to incontinence as PT discussed patient's w/c needs with ATP. Patient missed 10 min of skilled PT due to nursing care, RN made aware. Will attempt to make-up missed time as able.    Patient in the bed with new brief donned upon PT return. Donned pants with total A bed level. Patient verbose and easily distracted throughout remainder of session. Easily redirected, but with poor ability to maintain attention.   Therapeutic Activity: Bed Mobility: Patient performed rolling R/L with max A and cues for sequencing and reaching with his R hand for the bed rails. He performed supine to sit with max A of 1 person and min A from a second due to poor motor planning and patient distracted during  mobility. Provided verbal cues for setting his bottom elbow to push up to sitting. Transfers: Patient performed [facility] board transfer bed>w/c with bed fully deflated with max-total A +2 and total A for board placement. Provided cues for hand placement, board placement, and head-hips relationship for proper technique and decreased assist with transfers.   Patient transported with total A to the day room where PT and ATP discussed patient's wheelchair needs. Patient will need a w/c for sitting OOB during the day. Patient is currently not able to sit in a manual w/c safely due to poor trunk and head control in sitting. He is unable to propel a power w/c due to poor motor planning and awareness for safe mobility. ATP and PT made the following recommendations for safe OOB sitting and dependent locomotion at d/c.  Wheelchair measurements: Seat width: 18" Seat Depth: 18" Seat height: 19" Seat back height: 24" Wheelchair recommendations:  -TIS w/c to improve sitting tolerance, positioning, and to perform pressure relief every 30 min (tilt all the way back x2 min to move pressure off patient's sacrum, if already tilted far back sit patient upright x2 min).  -10" plush head rest due to poor head control and decreased arousal -18"x18" Roho cushion for pressure relief due to stage 2 skin breakdown on sacrum and incontinence -full slide on lap tray for upper extremity support and functional tasks due to 1.5 cm inferior subluxation at L glenohumeral joint -4" contoured back support with lateral supports at the trunk and hips to accommodate kyphotic posture and improved trunk positioning and reduce hip  abduction in the w/c -bilateral individual shoe boxes (gel) for pressure relief on patients feet due to lack of sensation on L and poor profusion and second digit ready to autoamputate on the R  Patient in TIS w/c at end of session with breaks locked, seat belt alarm set, and all needs within reach.    Therapy  Documentation Precautions:  Precautions Precautions: Fall Precaution Comments: hx syncope, L lean, hx dementia Restrictions Weight Bearing Restrictions: No   Therapy/Group: Individual Therapy  Ninoska Goswick L Vicenta Olds PT, DPT  01/31/2020, 5:06 PM

## 2020-01-31 NOTE — Progress Notes (Signed)
Speech Language Pathology Weekly Progress and Session Note  Patient Details  Name: Peter [name] MRN: [id number] Date of Birth: Mar 10, 1930  Beginning of progress report period: January 24, 2020 End of progress report period: January 31, 2020  Today's Date: 01/31/2020 SLP Individual Time: 3845-3646 SLP Individual Time Calculation (min): 52 min  Short Term Goals: Week 3: SLP Short Term Goal 1 (Week 3): Patient will consume current diet with minimal overt s/s of aspiration and mod A verbal cues for use of swallowing compensatory strategies (initate swallow, reduce oral holding, anterior spillage...etc.) SLP Short Term Goal 1 - Progress (Week 3): Met SLP Short Term Goal 2 (Week 3): Pt will demonstrate sustained attention to functional tasks for 10 minutes with Mod A verbal cues for redirection. SLP Short Term Goal 2 - Progress (Week 3): Met SLP Short Term Goal 3 (Week 3): Pt will recall daily and novel events with mod A verbal cues for use of external aids. SLP Short Term Goal 3 - Progress (Week 3): Met SLP Short Term Goal 4 (Week 3): Pt will demonstrate use of speech intelligibility skills at the phrase level with 60% accuarcy with mod A verbal cues. SLP Short Term Goal 4 - Progress (Week 3): Met    New Short Term Goals: Week 4: SLP Short Term Goal 1 (Week 4): STG=LTG due to short ELOS  Weekly Progress Updates: Pt made dprogress meeting 4 out 4 goals, with improvement at the end of the reporting period, likely due to treatment of UTI and possible Pneumonia. Pt participated in [facility] on 12/20 noting silent aspiration on thin liquids and prolonged mastication on dys 2 textures. SLP recommends diet of dys 1 textures and nectar thick liquids with ice chips provided PRN after oral care and in between meals. SLP educated pt's daughter Peter [name], via phone and she is in an agreement of diet plan. Pt continues to demonstrate islands of confusion, with good awareness of confusion when mentation clears.  Pt demonstrates improved sustained attention and speech intelligibility at phrase level/structured tasks. Education is on going. Pt would continue to benefit from skilled ST services in order to maximize functional independence and reduce burden of care, requiring 24 hour supervision at discharge with continued skilled ST services.      Intensity: Minumum of 1-2 x/day, 30 to 90 minutes Frequency: 3 to 5 out of 7 days Duration/Length of Stay: 12/30 Treatment/Interventions: Dysphagia/aspiration precaution training;Cognitive remediation/compensation;Cueing hierarchy;Functional tasks;Patient/family education;Internal/external aids;Speech/Language facilitation;Environmental controls   Daily Session  Skilled Therapeutic Interventions: Skilled ST services focused on education, swallow and speech skills. SLP provided education to pt and then later to daughter Peter [name]) via phone pertaining to today's MBS results. Pt demonstrated silent aspiration on thin liquids, less frequently in smaller quantities and prolonged mastication with dys 2 textures. SLP recommends diet of dys 1 textures and nectar thick liquids via TSP/cup sips with ice chips offered as PRN following oral care. [name] stated understanding all questions answered to satisfaction. SLP and nurse repositioned pt in bed and SLP provided nectar thick Coke. Pt consumed via TSP x5 and cup sips x2, resulting in a delayed cough following a cup sip. Pt stated " I took too much." SLP educated pt to consume small sips only. Pt requested SLP hold cup for cup sips and after x6 cup sips no further overt s/s aspiration noted. SLP also facilitated use of speech intelligibility strategies utilizing picture cards, pt was able to produce phrase with 80% intelligibility with mod A verbal cues. Pt was left in  room with call bell within reach and bed alarm set. SLP recommends to continue skilled services.    General    Pain Pain Assessment Pain Scale: 0-10 Pain  Score: 0-No pain  Therapy/Group: Individual Therapy  Duane Trias  [facility] 01/31/2020, 10:54 AM

## 2020-01-31 NOTE — Progress Notes (Signed)
This RN was doing his rounding when he saw patient having a large emesis of undigested food with OT. Tube feed was off for therapy. Nursing staffs assisted with cleaning and dressing change. We continue to monitor.

## 2020-01-31 NOTE — Progress Notes (Signed)
Patient ID: Peter [name], male   DOB: 26-Jun-1930, 86 y.o.   MRN: [id number]  SW returned phone call to pt dtr [name] who was inquiring about family edu. Stated that she and her husband came over the weekend and was able to get some family edu. Stated she was not able to come in for family edu now due to work schedule.   [name], MSW, [place] Office: [phone] Cell: [phone] Fax: [phone]

## 2020-01-31 NOTE — Progress Notes (Signed)
Occupational Therapy Session Note  Patient Details  Name: Peter [name] MRN: [id number] Date of Birth: [birth year]  Today's Date: 01/31/2020 OT Individual Time: 1430-1515 OT Individual Time Calculation (min): 45 min   Skilled Therapeutic Interventions/Progress Updates:    Pt greeted in bed, in care of nursing staff to dress stageable wounds on buttocks, just finishing up. Pt then engaged in face washing and hand hygiene with [facility] raised focusing on motor planning and Lt attention. OT performed gentle PROM and stretching of the flaccid Lt UE for edema mgt, cuing pt to to visually attend to his Lt arm and count aloud with therapist while maintaining stretches. HOH for pt to complete self ROM of the affected UE. Note that he was intermittently falling asleep and needed cues/conversation to maintain alertness. When OT was assisting pt with obtaining sidelying position for pressure relief near end of tx pt with large emesis that soiled pt and bed. Pt left in care of 2 RNs for hygiene/clothing change.    Therapy Documentation Precautions:  Precautions Precautions: Fall Precaution Comments: hx syncope, L lean, hx dementia Restrictions Weight Bearing Restrictions: No Vital Signs: Therapy Vitals Temp: 99.6 F (37.6 C) Temp Source: Oral Pulse Rate: 63 Resp: 14 BP: (!) 137/38 Patient Position (if appropriate): Lying Oxygen Therapy SpO2: 95 % O2 Device: Room Air Pain: pt with no c/o pain or nausea during tx Pain Assessment Pain Score: Asleep ADL: ADL Upper Body Dressing: Maximal assistance Lower Body Dressing: Dependent Where Assessed-Lower Body Dressing: Edge of bed Toileting: Dependent Where Assessed-Toileting: Bed level     Therapy/Group: Individual Therapy  Naod Sweetland A Aadvika Konen 01/31/2020, 3:40 PM

## 2020-01-31 NOTE — Progress Notes (Signed)
[place] PHYSICAL MEDICINE & REHABILITATION PROGRESS NOTE   Subjective/Complaints:  Up in bed. Toe,back hurt still. About to go down for MBS this morning   ROS: Limited due to cognitive/behavioral   Objective:   No results found. Recent Labs    01/31/20 0727  WBC 11.1*  HGB 8.2*  HCT 26.8*  PLT 315   Recent Labs    01/31/20 0727  NA 143  K 4.9  CL 109  CO2 22  GLUCOSE 260*  BUN 65*  CREATININE 1.69*  CALCIUM 8.4*    Intake/Output Summary (Last 24 hours) at 01/31/2020 1036 Last data filed at 01/31/2020 0851 Gross per 24 hour  Intake 780 ml  Output --  Net 780 ml    Physical Exam: Vital Signs Blood pressure (!) 165/50, pulse 66, temperature 98.1 F (36.7 C), temperature source Oral, resp. rate 18, height 5\' 7"  (1.702 m), weight 69.2 kg, SpO2 98 %. Constitutional: No distress . Vital signs reviewed. HEENT: EOMI, oral membranes moist Neck: supple Cardiovascular: RRR without murmur. No JVD    Respiratory/Chest: CTA Bilaterally without wheezes or rales. Normal effort    GI/Abdomen: BS +, non-tender, non-distended Ext: no clubbing, cyanosis, or edema Psych: pleasant, interactive today Skin: redness at sacrum. Necrotic right 2nd toe stable in appearance Musc: No edema in extremities.  No tenderness in extremities. Intrinsic muscle atrophy bilateral hands Neuro: alert today, speech dysarthric. Left central 7, visual field cut and inattention. Motor: RUE/RLE: 4-4+ /5 proximal distal LUE: 0-tr/5 proximal with inattention. Decreased LT LUE>LLE LLE: 1 to 2/5 proximal to distal with cueing d/t inattention  Assessment/Plan: 1. Functional deficits which require 3+ hours per day of interdisciplinary therapy in a comprehensive inpatient rehab setting.  Physiatrist is providing close team supervision and 24 hour management of active medical problems listed below.  Physiatrist and rehab team continue to assess barriers to discharge/monitor patient progress toward  functional and medical goals  Care Tool:  Bathing    Body parts bathed by patient: Right arm,Left arm,Chest,Abdomen,Front perineal area,Right upper leg,Left upper leg,Face   Body parts bathed by helper: Buttocks,Right lower leg,Left lower leg     Bathing assist Assist Level: Maximal Assistance - Patient 24 - 49%     Upper Body Dressing/Undressing Upper body dressing   What is the patient wearing?: Pull over shirt    Upper body assist Assist Level: Maximal Assistance - Patient 25 - 49% (sitting EOB)    Lower Body Dressing/Undressing Lower body dressing      What is the patient wearing?: Pants     Lower body assist Assist for lower body dressing: Maximal Assistance - Patient 25 - 49%     Toileting Toileting    Toileting assist Assist for toileting: Dependent - Patient 0% (3 helpers)     Transfers Chair/bed transfer  Transfers assist     Chair/bed transfer assist level: Maximal Assistance - Patient 25 - 49% (with Beezy board)     Locomotion Ambulation   Ambulation assist      Assist level: 2 helpers Assistive device: Parallel bars Max distance: 6 ft   Walk 10 feet activity   Assist     Assist level: 2 helpers Assistive device: Hand held assist   Walk 50 feet activity   Assist Walk 50 feet with 2 turns activity did not occur: Safety/medical concerns  Assist level: 2 helpers Assistive device: Hand held assist    Walk 150 feet activity   Assist Walk 150 feet activity did not occur:  Safety/medical concerns         Walk 10 feet on uneven surface  activity   Assist Walk 10 feet on uneven surfaces activity did not occur: Safety/medical concerns         Wheelchair     Assist Will patient use wheelchair at discharge?: No             Wheelchair 50 feet with 2 turns activity    Assist            Wheelchair 150 feet activity     Assist          Blood pressure (!) 165/50, pulse 66, temperature 98.1 F  (36.7 C), temperature source Oral, resp. rate 18, height 5\' 7"  (1.702 m), weight 69.2 kg, SpO2 98 %.    Medical Problem List and Plan: 1.  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 Involvement of right frontal lobe too.  Appreciate neurology recs.  -family opting for home with hospice on 12/24. Working on nutrition/swallow this week, family ed and preparing patient/family for home. Outpatient hospice in involved with case 2.  Antithrombotics: -DVT/anticoagulation: SCDs             -antiplatelet therapy: Aspirin 81 mg daily and Brilinta 90 mg twice daily x3 months then Brilinta alone   -will send in rx for brilinta to pharmacy as hospice apparently doesn't have this med   -neurology recommends the above regimen.  3. Pain Management: Neurontin 300 mg twice daily, Cymbalta 30 mg daily.   -has pain in right foot at night which is ongoing  - scheduled tylenol  12/20 oxycodone prn for severe pain 4. Mood: Namenda 10 mg twice daily, Aricept 10 mg daily             -antipsychotic agents: N/A  -12/20 continue ritalin at 10mg  for arousal, initiation 5. Neuropsych: This patient is? capable of making decisions on his own behalf. 6. Skin/Wound Care: Routine skin checks  -kerlix wrap  for right foot to keep ischemic toe from catching  Continue pressure relief for coccyx ulcer 7. Fluids/Electrolytes/Nutrition: family/staff to encourage PO  -12/17 Cortrak inserted 12/15. Tolerating reasonably well    -recent electrolytes ok---recheck Monday   -prealbumin low (pre-TF), 15.9, but not as bad as I feared   -continue TF/oral feeding as possible  1218- pt ate a little for breakfast- con't to encourage.   12/19-20- TF + 10-50% po intake with meals 8.  Dysphagia.    D2 nectars-->downgraded to D1 12/13 d/t lethargy   -MBS today 9.   Hypertension.  Norvasc 10 mg daily, Coreg 6.25 mg twice daily, Cardura 4 mg daily, hydralazine 100 mg 3 times daily, HCTZ 12.5 mg daily, Imdur 60 mg daily.   -controlled Allow some elevation for perfusion  12/19- BP 170/45- but hadn't received AM meds- will monitor 10.  History of prostate cancer.  Continue Proscar 5 mg daily. 11.  Diabetes mellitus with hyperglycemia.  Hemoglobin A1c 6.0.    lantus now bid with TF running  12/19- 151-294- increase Lantus- to 18 units BID   12/20 observe cbg's today  12.  Diastolic congestive heart failure.  Monitor for any signs of fluid overload   Filed Weights   01/14/20 0600 01/15/20 1005 01/21/20 0300  Weight: 72.2 kg 73.1 kg 69.2 kg  12/14 weight trending down Need new weight today   -pt weighed 71.8 kg at admission to rehab  13.  History of gout.  Allopurinol 100 mg daily.  Monitor for any gout flareups 14.  Acute on chronic anemia with heme + stool.       --transfuse if <7.5  -Continue Protonix 40 mg daily  Transfused 1 unit PRBC on 12/5  Hemoglobin 8.7 on 12/6  GI saw patient on acute and deferred endo/colonoscopy unless gross, significant bleeding  -no gross blood in stool  12/16 transfused 1u for hgb 7.4  12/20 hgb 8.2 today 15.  Hyperlipidemia.  Lipitor 16.  CAD with PCI.  Continue aspirin and Brilinta for now follow-up outpatient cardiology service.  No current plan for ischemic work-up 17.  CKD stage III.  Follow-up chemistries.  Creatinine 1.42 12/10  12/10 resuming fluids as above 18. Fever. UTI and/or aspiration pneumonia   -UCX + for 100K klebsiella pneumoniae   -12/16 still have concerns about his chest, aspiration risk    -wbc's are stable at 10.9, afebrile.     -continue cefepime for now   12/20 afebrile, cxr without active disease 12/16    -dc cefepime---> bactrim ds bid continue at least thru today       LOS: 21 days A FACE TO FACE EVALUATION WAS PERFORMED  Meredith Staggers 01/31/2020, 10:36 AM

## 2020-02-01 ENCOUNTER — Inpatient Hospital Stay (HOSPITAL_COMMUNITY): Payer: Medicare Other

## 2020-02-01 ENCOUNTER — Inpatient Hospital Stay (HOSPITAL_COMMUNITY): Payer: Medicare Other | Admitting: Speech Pathology

## 2020-02-01 LAB — GLUCOSE, CAPILLARY
Glucose-Capillary: 119 mg/dL — ABNORMAL HIGH (ref 70–99)
Glucose-Capillary: 99 mg/dL (ref 70–99)
Glucose-Capillary: 154 mg/dL — ABNORMAL HIGH (ref 70–99)

## 2020-02-01 MED ORDER — DULOXETINE HCL 30 MG PO CPEP: 30.0000 mg | ORAL_CAPSULE | Freq: Every day | ORAL | 0 refills | Status: AC

## 2020-02-01 MED ORDER — ACETAMINOPHEN 160 MG/5ML PO SOLN: 325.0000 mg | Freq: Three times a day (TID) | ORAL | 0 refills | Status: AC | PRN

## 2020-02-01 MED ORDER — CARVEDILOL 6.25 MG PO TABS
6.2500 mg | ORAL_TABLET | Freq: Two times a day (BID) | ORAL | 0 refills | Status: AC
Start: 2020-02-01 — End: ?

## 2020-02-01 MED ORDER — MIRTAZAPINE 7.5 MG PO TABS
7.5000 mg | ORAL_TABLET | Freq: Every day | ORAL | 0 refills | Status: AC
Start: 2020-02-01 — End: ?

## 2020-02-01 MED ORDER — NITROGLYCERIN 0.4 MG SL SUBL: 0.4000 mg | SUBLINGUAL_TABLET | SUBLINGUAL | 0 refills | Status: AC | PRN

## 2020-02-01 MED ORDER — GABAPENTIN 600 MG PO TABS
300.0000 mg | ORAL_TABLET | Freq: Two times a day (BID) | ORAL | 0 refills | Status: AC
Start: 2020-02-01 — End: ?

## 2020-02-01 MED ORDER — ACETAMINOPHEN 325 MG PO TABS: 650.0000 mg | ORAL_TABLET | Freq: Four times a day (QID) | ORAL | Status: AC | PRN

## 2020-02-01 MED ORDER — ATORVASTATIN CALCIUM 80 MG PO TABS: 40.0000 mg | ORAL_TABLET | Freq: Every day | ORAL | 0 refills | Status: AC

## 2020-02-01 MED ORDER — GABAPENTIN 600 MG PO TABS
300.0000 mg | ORAL_TABLET | Freq: Two times a day (BID) | ORAL | Status: DC
  Administered 2020-02-01 – 2020-02-04 (×6): 300 mg via ORAL
  Filled 2020-02-01 (×6): qty 1

## 2020-02-01 MED ORDER — DOXAZOSIN MESYLATE 4 MG PO TABS
4.0000 mg | ORAL_TABLET | Freq: Every day | ORAL | 0 refills | Status: AC
Start: 2020-02-02 — End: ?

## 2020-02-01 MED ORDER — AMLODIPINE BESYLATE 10 MG PO TABS: 10.0000 mg | ORAL_TABLET | Freq: Every day | ORAL | 0 refills | Status: AC

## 2020-02-01 MED ORDER — OXYCODONE HCL 5 MG/5ML PO SOLN: 2.5000 mg | Freq: Three times a day (TID) | ORAL | 0 refills | Status: AC | PRN

## 2020-02-01 MED ORDER — DONEPEZIL HCL 10 MG PO TABS: 10.0000 mg | ORAL_TABLET | Freq: Every day | ORAL | 0 refills | Status: AC

## 2020-02-01 MED ORDER — ISOSORBIDE MONONITRATE ER 60 MG PO TB24: 60.0000 mg | ORAL_TABLET | Freq: Every day | ORAL | 0 refills | Status: AC

## 2020-02-01 MED ORDER — FINASTERIDE 5 MG PO TABS: 5.0000 mg | ORAL_TABLET | Freq: Every day | ORAL | 0 refills | Status: AC

## 2020-02-01 MED ORDER — MEMANTINE HCL 10 MG PO TABS: 10.0000 mg | ORAL_TABLET | Freq: Two times a day (BID) | ORAL | 1 refills | Status: AC

## 2020-02-01 MED ORDER — ALLOPURINOL 100 MG PO TABS: 100.0000 mg | ORAL_TABLET | Freq: Every day | ORAL | 0 refills | Status: AC

## 2020-02-01 MED ORDER — HYDRALAZINE HCL 100 MG PO TABS: 100.0000 mg | ORAL_TABLET | Freq: Three times a day (TID) | ORAL | 0 refills | Status: AC

## 2020-02-01 MED ORDER — GUAIFENESIN ER 600 MG PO TB12: 600.0000 mg | ORAL_TABLET | Freq: Two times a day (BID) | ORAL | 0 refills | Status: AC

## 2020-02-01 MED ORDER — PANTOPRAZOLE SODIUM 40 MG PO TBEC: 40.0000 mg | DELAYED_RELEASE_TABLET | Freq: Every day | ORAL | 0 refills | Status: AC

## 2020-02-01 MED ORDER — OSMOLITE 1.5 CAL PO LIQD
1000.0000 mL | ORAL | Status: DC
  Administered 2020-02-01 (×2): 1000 mL
  Filled 2020-02-01: qty 1000

## 2020-02-01 NOTE — Progress Notes (Signed)
Occupational Therapy Weekly Progress Note  Patient Details  Name: Peter [name] MRN: [id number] Date of Birth: 09/17/1930  Beginning of progress report period: January 25, 2020 End of progress report period: February 01, 2020  Today's Date: 02/01/2020 OT Individual Time: 1415-1440 OT Individual Time Calculation (min): 25 min    Patient has met 0 of 12 long term goals. Pt's plan of care has been updated to reflect transition to home with hospice care. [name] has had multiple fluctuations of medical status, responsiveness, and ability. His family remains dedicated to care for him at home and is ready for a Christmas eve discharge. Family education is scheduled with care providers and family to ensure competence with hoyer transfers.   Patient continues to demonstrate the following deficits: muscle weakness and muscle joint tightness, decreased cardiorespiratoy endurance, decreased coordination and decreased motor planning, decreased initiation, decreased attention, decreased awareness, decreased problem solving, decreased safety awareness, decreased memory and delayed processing and decreased sitting balance, decreased standing balance, decreased postural control and hemiplegia and therefore will continue to benefit from skilled OT intervention to enhance overall performance with Reduce care partner burden.  See Patient's Care Plan for progression toward long term goals.  Patient not progressing toward long term goals. Pt is now planning on d/c to hospice care. Goals   Continue plan of care.  Skilled Therapeutic Interventions/Progress Updates:   Pt received supine, quite lethargic but perking up after several minutes of conversation. Pt was able to attend to his LUE with mod verbal and tactile cueing and on command activate finger flexion! Pt's LUE was brought through full PROM with total A to maintain muscle length/elasticity. No c/o pain throughout. Pt requested oral care to be completed. 2/2  lethargy it was done with a suction toothbrush with max A overall. Throughout session pt nodding off to sleep and requiring cueing to regain arousal. Pt requested to drink water and was given several sips of nectar thick water via tsp with mod cueing for arousal and swallow. No overt s/s of aspiration however pt with wet cough several times during session. Pt fell back asleep and was resting peacefully so session was terminated. 20 min missed. Pt left supine, all needs met.   Therapy Documentation Precautions:  Precautions Precautions: Fall Precaution Comments: hx syncope, L lean, hx dementia Restrictions Weight Bearing Restrictions: No   Therapy/Group: Individual Therapy  Curtis Sites 02/01/2020, 10:07 AM

## 2020-02-01 NOTE — Progress Notes (Signed)
Patient ID: Peter [name], male   DOB: 01/12/1931, 86 y.o.   MRN: [id number]  SW received a phone call from [facility] with [place] reporting that the assigned RN was not able to come and she would be coming in her place, and she inquired about pt care needs and what time for family education. SW informed fam edu 8am-12pm. She reported she will do her best with arriving here by 8am but could not guarantee as coming from [facility]. SW informed will call her dtr to inform on report.   SW called pt dtr Peter [name] to inform on above. She inquired about pt  Medications, and reports [facility] states that pt will need atleast a 30 day supply of medication. She also asked about transportation being arranged as she would like pt to leave around 10 or 11am and would like for d/c summary to be expedited to avoid any delays with patient discharge. SW informed will follow-up with hospice about medications being requested as not aware this would be needed; not aware of SW being required to arrange transportation and; will inform medical team on request for d/c summary. SW to also notify front desk on 3 people coming in for family edu tomorrow (dtr, CNA and Therapist, sports).   SW spoke with Peter [name]/[name] [phone]) to get insight on medications pt will need. States that she will have Peter [name] follow-up with SW as she is working on this case.   SW received phone call from Peter [name] who reported pt is hospice eligible,and not enrolled in hospice. Will determine if pt will be accepted as hospice pt once RN assessment is completed in the home. Reports will still need SW to coordinate transportation to home. Will work on arranging all DME for pt to be delivered. States pt will need atleast a 30 day supply of medications and any comfort medications until they are able to get into the home to address pt care needs.   SW notified front desk on about family edu. SW informed medical team on above  updates. Medications sent to pharmacy. [facility] ambulance scheduled for 9am on 12/24 with transport to home.   Peter [name], MSW, [place] Office: [phone] Cell: [phone] Fax: [phone]

## 2020-02-01 NOTE — Progress Notes (Signed)
Late entry:Around 9pm 12/20 writer  was called to Pt room -Pt having emesis , medium amount of undigested tube feeding.  patient alert and responsive. Feeding stopped. Oral suction performed. Pt is very anxious, emotional support was provided. Pt insulin was held, due to Pt is off the feeding. PA was notified. Will re check FS on on next round. Xanax x1 was ordered, administered, effective. Zofran was given, no further emesis at this time. Will continue to monitor.

## 2020-02-01 NOTE — Progress Notes (Signed)
NT suction done. Copious amount of yellow thick suctioned out of airway. Nurse Tech at bedside.

## 2020-02-01 NOTE — Progress Notes (Signed)
Physical Therapy Note  Patient Details  Name: Peter [name] MRN: [id number] Date of Birth: 16-Feb-1930 Today's Date: 02/01/2020    Patient in bed asleep upon PT arrival. Patient aroused to tactile stimulation with difficulty keeping his eyes open. Stated he is feeling well and complained of congestion. Patient unable to maintain arousal with multiple attempts. Patient missed 30 min of skilled PT due to lethargy, RN made aware. Will attempt to make-up missed time as able.      Anquanette Bahner L [place] PT, DPT  02/01/2020, 10:39 AM

## 2020-02-01 NOTE — Progress Notes (Signed)
Speech Language Pathology Daily Session Note  Patient Details  Name: Wylie Coon MRN: [id number] Date of Birth: 01/12/1931  Today's Date: 02/01/2020 SLP Individual Time: 1315-1410 SLP Individual Time Calculation (min): 55 min  Short Term Goals: Week 4: SLP Short Term Goal 1 (Week 4): STG=LTG due to short ELOS  Skilled Therapeutic Interventions: Skilled treatment session focused on education with the patient's daughter and cognitive goals. Upon arrival, patient was lethargic and demonstrated difficulty staying awake for any extended period of time. [name] present and educated on patient's current swallowing function, diet recommendations, appropriate textures and allowing thin liquids for pleasure knowing the risks. She verbalized understanding of all information. Patient performed oral care via the suction toothbrush with Mod verbal cues for attention to task in preparation for ice chips. However, patient became too lethargic to allow trials safely. Patient  was incontinent of urine and awakened enough to participate in bed mobility with Max-Total A for peri care. Patient repositioned in bed and was asleep by the time he was sat upright again.  Patient left in bed with all needs within reach. Continue with current plan of care.      Pain No/Denies Pain   Therapy/Group: Individual Therapy  Leonell Lobdell 02/01/2020, 3:09 PM

## 2020-02-01 NOTE — Progress Notes (Addendum)
Manufacturing engineer [facility]) Hospital Liaison: RN note     Hospice eligibility has been confirmed. Patient will be admitted to [facility] home hospice services after discharge home on Friday 02/04/20.     Please arrange for patient to be transported home by ambulance as early as possible Friday so [facility] admission nurse can admit patient to our serivce.   Please send signed and completed DNR form home with patient/family. Patient will need prescriptions for discharge comfort medications.   Please send patient home with a Foley Catheter.    Patient/family requests the following DME for delivery to the home: Hospital bed with air mattress overlay, hoyer lift and oral suction. [place] equipment manager has been notified and will contact DME provider to arrange delivery to the home. Home address has been verified and is correct in the chart.      [facility] aware of the above. Please notify [facility] when patient is ready to leave the unit at discharge. (Call [phone] or [phone] after 5pm.) [facility] information and contact numbers given to daughter [name].       A Please do not hesitate to call with questions.     Thank you,    Farrel Gordon, RN, [place] (listed on [facility] under [place])     [phone]

## 2020-02-01 NOTE — Patient Care Conference (Signed)
Inpatient RehabilitationTeam Conference and Plan of Care Update Date: 02/01/2020   Time: 10:04 AM    Patient Name: Peter [name]      Medical Record Number: [id number]  Date of Birth: October 21, 1930 Sex: Male         Room/Bed: 4W09C/4W09C-01 Payor Info: Payor: MEDICARE / Plan: MEDICARE PART A AND B / Product Type: *No Product type* /    Admit Date/Time:  01/10/2020  2:41 PM  Primary Diagnosis:  Right middle cerebral artery stroke [facility])  Hospital Problems: Principal Problem:   Right middle cerebral artery stroke ([place]) Active Problems:   Malnutrition of moderate degree   Essential hypertension   Decubitus ulcer of coccyx, stage II ([place])   Controlled type 2 diabetes mellitus with hyperglycemia, without long-term current use of insulin ([place])   Labile blood glucose   Hypokalemia   Acute on chronic anemia   Chronic diastolic congestive heart failure ([place])   Stage 3b chronic kidney disease [facility])    Expected Discharge Date: Expected Discharge Date: 02/04/20  Team Members Present: Physician leading conference: Dr. Alger Simons Care Coodinator Present: [name], LCSWA;Deniah Saia Creig Hines, RN, BSN, CRRN Nurse Present: Rayne Du, LPN PT Present: Apolinar Junes, PT OT Present: Laverle Hobby, OT SLP Present: Weston Anna, SLP PPS Coordinator present : Ileana Ladd, Burna Mortimer, SLP     Current Status/Progress Goal Weekly Team Focus  Bowel/Bladder   pt is incontinent of B/B. Last BM 12/20  Pt to regain continence  assess B/B Q shift and PRN   Swallow/Nutrition/ Hydration   Dys. 1 textures with nectar-thick liquids via cup, Mod A  Max A  Family Education   ADL's   Still declined in function, lethargic, L hemi, max-total A  downgraded to mod A  Arousal, family training, d/c planning, LUE NMR   Mobility   Patient with poor progress this week, requires max A of 1-2 for bed mobility and transfers, plan to use hoyer lift and TIS w/c for home mobility  Mod A bed  mobility and transfers, caregivers independent with hoyer lift transfers  Caregiver and patient education and hands on training, d/c planning due to lack of progress   Communication   Mod-Max A  Max A  Family Education   Safety/Cognition/ Behavioral Observations  Max A  Max A  Family Education   Pain   pt reports occasional pain to back 4/10  pain <3  assess pain Q shift and PRN   Skin   necrotic middle Right toe,MASD to sacrum  No new skin breakdown  assess skin Q shift and PRN     Discharge Planning:  Family conference held on 12/14. Pt now to d/c to home with hospice as requested by pt dt [name] on 12/16. [facility] to be involved,and will help coordinate pt care needs at discharge. Reports there will be a live in aide that will begin on 12/24. Family education 12/22 with aide and pt dtr [name].   Team Discussion: Vomiting tube feed, MD decreasing rate, MD putting in order to place foley. No complaints of pain, respiratory deep suctioned and removed copious amounts. Family education tomorrow, and Ambulance transport setup for Friday. OT reports patient has had a decline in function and has downgraded goals to mod assist. PT reports patient has had a decline in function and is having Media planner with the family. The family is trying to locate a tilt-n-space W/C for the patient. SLP reports the patient failed the MBS yesterday. Will go home on  a Dys 1 with nectar, recommend doing oral care often as to not aspirate. Patient is being discharged on [place]. Patient on target to meet rehab goals: no, goals have been downgraded and patient going home on Hospice care.  *See Care Plan and progress notes for long and short-term goals.   Revisions to Treatment Plan:  MD decreasing tube feed rate, order placed to insert foley  Teaching Needs: Family education on-going Building surveyor care training  Current Barriers to Discharge: Decreased  caregiver support, Home enviroment access/layout, Lack of/limited family support, Behavior, Nutritional means and end of life.  Possible Resolutions to Barriers: Continue current medications, continue and educate foley care, offer emotional support to patient and family.     Medical Summary Current Status: declining neuro status, increased left HP. vomitted yesterday TF, undigested food. no abx, oxygen. discharge planning for home/ hospice.  Barriers to Discharge: Other (comments)  Barriers to Discharge Comments: end of life considerations Possible Resolutions to Barriers/Weekly Focus: continue dc, hospice planning, family ed   Continued Need for Acute Rehabilitation Level of Care: The patient requires daily medical management by a physician with specialized training in physical medicine and rehabilitation for the following reasons: Direction of a multidisciplinary physical rehabilitation program to maximize functional independence : Yes Medical management of patient stability for increased activity during participation in an intensive rehabilitation regime.: Yes Analysis of laboratory values and/or radiology reports with any subsequent need for medication adjustment and/or medical intervention. : Yes   I attest that I was present, lead the team conference, and concur with the assessment and plan of the team.   Cristi Loron 02/01/2020, 3:54 PM

## 2020-02-01 NOTE — Progress Notes (Signed)
Nutrition Follow-up  DOCUMENTATION CODES:   Non-severe (moderate) malnutrition in context of chronic illness  INTERVENTION:   When appropriate, recommend restarting tube feeds if tube feeds remain within [place]: - Restart Osmolite 1.5 @ 35 ml/hr and advance by 10 ml q 8 hours to goal rate of 65 ml/hr (tube feeds can be held for up to 4 hours for therapies) - ProSource TF 45 ml BID  Tube feeding regimen at goal rate provides 2030kcal, 104grams of protein, and967ml of H2O.   -ContinueMagicCup TID with meals, each supplement provides 290 kcal and 9 grams of protein  -ContinueMightyShake x 2 BID, each supplement provides330kcal and 9grams of protein  - Vanilla Greek yogurt TID with meals  -Continue to encourage adequate PO intakeand provide feeding assistance with meals  NUTRITION DIAGNOSIS:   Severe Malnutrition related to chronic illness (dementia, CHF, prostate cancer) as evidenced by moderate fat depletion,severe muscle depletion,percent weight loss (10.4% weight loss in 6 months).  Ongoing  GOAL:   Patient will meet greater than or equal to 90% of their needs  Unmet at this time, pt too lethargic for POs and not receiving TF at this time due to emesis episodes  MONITOR:   PO intake,Supplement acceptance,Diet advancement,Labs,Weight trends,Skin,I & O's  REASON FOR ASSESSMENT:   Consult Enteral/tube feeding initiation and management  ASSESSMENT:   86 year old male with PMH of CAD/PCI, CKD stage III, dementia, DM, CHF, HTN, HLD, PVD, prostate cancer. Presented 12/30/19 with left-sided weakness and slurred speech. Pt found to have acute right MCA stroke. Admitted to CIR on 11/29.  12/15 - Cortrak placed, TF initiated 12/20 - MBS with recommendations to continue dysphagia 1 diet and nectar-thick liquids, pt with emesis  Noted plan for pt to d/c home with hospice on 12/24.  Spoke with RN. Pt had episodes of emesis yesterday and TF is currently off. RN  reports plan is to restart tube feeds today at 35 ml/hr per MD. RN aware of goal rate of 65 ml/hr.  Attempted to speak with pt and family at bedside. Pt lethargic and did not interact with RD and family not available.  11/19 CIR admit weight: 71.8 kg 12/10 weight: 69.2 kg  Meal Completion: 0-50%  Medications reviewed and include: SSI, ritalin, remeron, protonix, klor-con  Labs reviewed: BUN 65, creatinine 1.69, hemoglobin 8.2 CBG's: 119-248 x 24 hours  Diet Order:   Diet Order            DIET - DYS 1 Room service appropriate? Yes; Fluid consistency: Nectar Thick  Diet effective now                 EDUCATION NEEDS:   Not appropriate for education at this time  Skin:  Skin Assessment: Skin Integrity Issues: Diabetic Ulcer: right middle toe Other: MASD to medial sacrum  Last BM:  01/31/20  Height:   Ht Readings from Last 1 Encounters:  01/10/20 5\' 7"  (1.702 m)    Weight:   Wt Readings from Last 1 Encounters:  01/21/20 69.2 kg    BMI:  Body mass index is 23.89 kg/m.  Estimated Nutritional Needs:   Kcal:  1900-2100  Protein:  100-115 grams  Fluid:  2.0 L/day    Gustavus Bryant, MS, RD, LDN Inpatient Clinical Dietitian Please see AMiON for contact information.

## 2020-02-01 NOTE — Consult Note (Signed)
WOC Nurse Consult Note: Patient receiving care in [facility] [phone]. Reason for Consult: sacral wound Wound type: unstageable Pressure Injury POA: Yes Measurement: 4 cm x 1.8 cm x unknown depth Wound bed: There are two distinct wounds at the apex of the medial gluteal fold with an intact epithelial island between them.  The measurements above include the intact island.  The top most wound is 100% pink and is consistent with a stage 2.  The lower wound closest to the coccyx has a very small spot of yellow slough, therefore is consistent with an unstageable wound. Drainage (amount, consistency, odor) none, foam dressing in place Periwound: intact Dressing procedure/placement/frequency: Cleanse sacral wounds with saline, pat dry. Apply Skin Protectant Wipes around the wounds Kellie Simmering [phone]), allow to dry. Then place a hydrocolloid dressing Kellie Simmering [phone]) over the wound. Change daily and prn dislodgement. Monitor the wound area(s) for worsening of condition such as: Signs/symptoms of infection,  Increase in size,  Development of or worsening of odor, Development of pain, or increased pain at the affected locations.  Notify the medical team if any of these develop.  Thank you for the consult. [place] nurse will not follow at this time.  Please re-consult the [place] team if needed.  Val Riles, RN, MSN, CWOCN, CNS-BC, pager [phone]
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
## 2020-02-02 ENCOUNTER — Ambulatory Visit (HOSPITAL_COMMUNITY): Payer: Medicare Other

## 2020-02-02 ENCOUNTER — Encounter (HOSPITAL_COMMUNITY): Payer: Medicare Other

## 2020-02-02 ENCOUNTER — Inpatient Hospital Stay (HOSPITAL_COMMUNITY): Payer: Medicare Other

## 2020-02-02 ENCOUNTER — Encounter (HOSPITAL_COMMUNITY): Payer: Medicare Other | Admitting: Speech Pathology

## 2020-02-02 LAB — GLUCOSE, CAPILLARY
Glucose-Capillary: 164 mg/dL — ABNORMAL HIGH (ref 70–99)
Glucose-Capillary: 188 mg/dL — ABNORMAL HIGH (ref 70–99)
Glucose-Capillary: 224 mg/dL — ABNORMAL HIGH (ref 70–99)
Glucose-Capillary: 208 mg/dL — ABNORMAL HIGH (ref 70–99)
Glucose-Capillary: 208 mg/dL — ABNORMAL HIGH (ref 70–99)
Glucose-Capillary: 230 mg/dL — ABNORMAL HIGH (ref 70–99)
Glucose-Capillary: 289 mg/dL — ABNORMAL HIGH (ref 70–99)

## 2020-02-02 IMAGING — DX DG CHEST 1V PORT
1 series · 1 of 1 positions shown · non-contrast
Comparison: [DATE].

CLINICAL DATA: Cough.

EXAM:
PORTABLE CHEST 1 VIEW

[chest]
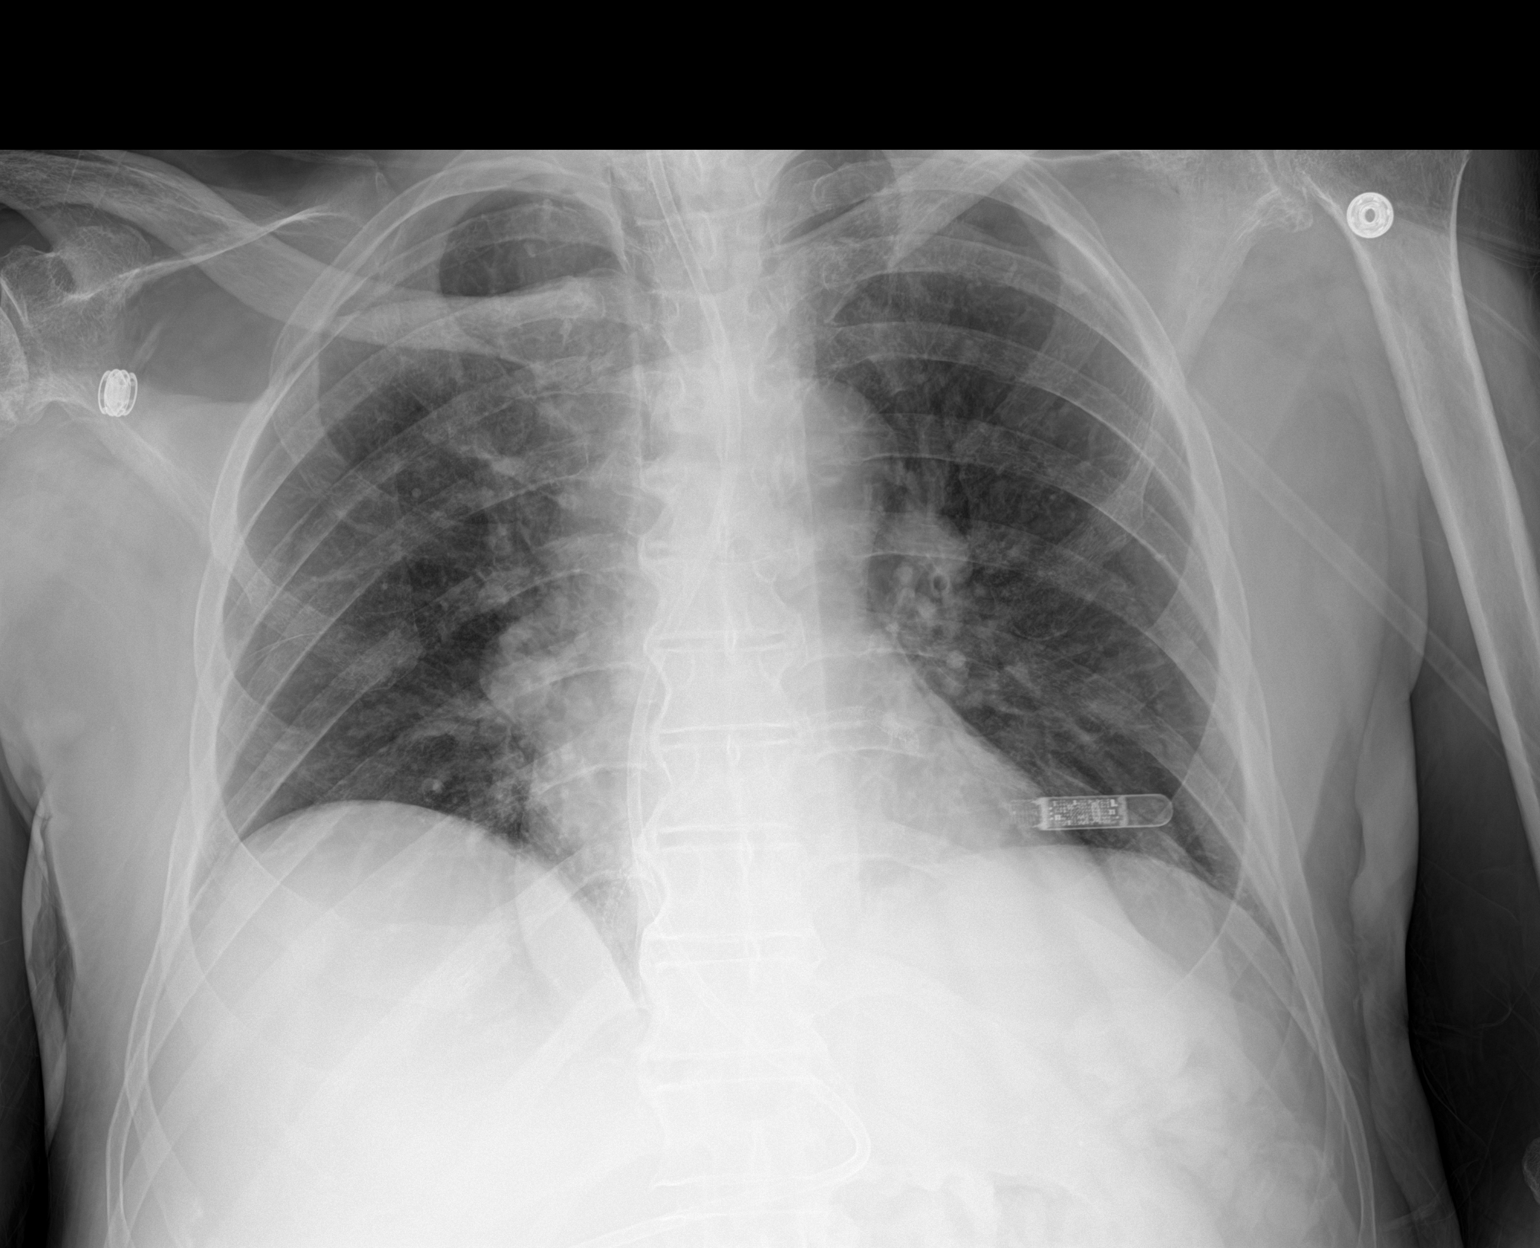

[1 of 1 positions shown; findings below may reference images not displayed]

FINDINGS: The heart size and mediastinal contours are within normal limits.
Pulmonary vascular congestion. Coronary stent. Loop recorder
projects over the left chest. Enteric tube courses below the
diaphragm in outside the field of view. No consolidation. No visible
pleural effusions or pneumothorax. No acute osseous abnormality.
IMPRESSION: Pulmonary vascular congestion without overt pulmonary edema. No
consolidation.

## 2020-02-02 NOTE — Progress Notes (Signed)
[place] PHYSICAL MEDICINE & REHABILITATION PROGRESS NOTE   Subjective/Complaints: W/ secretions Home services RN at bedside.  Daughter requests CXR given secretions, ordered and no evidence of consolidation Daughter requests foley placed prior to d/c  ROS: limited d/t cognition, arousal  Objective:   DG Chest Port 1 View  Result Date: 02/02/2020 CLINICAL DATA:  Cough. EXAM: PORTABLE CHEST 1 VIEW COMPARISON:  January 27, 2020. FINDINGS: The heart size and mediastinal contours are within normal limits. Pulmonary vascular congestion. Coronary stent. Loop recorder projects over the left chest. Enteric tube courses below the diaphragm in outside the field of view. No consolidation. No visible pleural effusions or pneumothorax. No acute osseous abnormality. IMPRESSION: Pulmonary vascular congestion without overt pulmonary edema. No consolidation. Electronically Signed   By: Margaretha Sheffield MD   On: 02/02/2020 10:57   Recent Labs    01/31/20 0727  WBC 11.1*  HGB 8.2*  HCT 26.8*  PLT 315   Recent Labs    01/31/20 0727  NA 143  K 4.9  CL 109  CO2 22  GLUCOSE 260*  BUN 65*  CREATININE 1.69*  CALCIUM 8.4*    Intake/Output Summary (Last 24 hours) at 02/02/2020 1531 Last data filed at 02/02/2020 0455 Gross per 24 hour  Intake 350 ml  Output --  Net 350 ml    Physical Exam: Vital Signs Blood pressure (!) 135/43, pulse (!) 59, temperature 97.6 F (36.4 C), temperature source Axillary, resp. rate 16, height 5\' 7"  (1.702 m), weight 69.2 kg, SpO2 96 %. Gen: no distress, normal appearing HEENT: oral mucosa pink and moist, NCAT Respiratory/Chest: O2 [place], doesn't take deep breaths, sounds appear more upper airway but difficult to truly assess given effort. Bradycardic.  GI/Abdomen: BS +, non-tender, non-distended Ext: no clubbing, cyanosis, or edema Psych: pleasant but more fatigued appearing today. Skin: redness at sacrum. Necrotic right 2nd toe stable in appearance Musc:  No edema in extremities.  No tenderness in extremities. Intrinsic muscle atrophy bilateral hands Neuro: dysarthric speech. Alert at times but then can quickly fall asleep. Left central 7, visual field cut and inattention. Motor: RUE/RLE: 4-4+ /5 proximal distal LUE: 0/5 proximal with inattention. Decreased LT LUE>LLE LLE: 1 to 2/5 proximal to distal with cueing required to trigger  Assessment/Plan: 1. Functional deficits which require 3+ hours per day of interdisciplinary therapy in a comprehensive inpatient rehab setting.  Physiatrist is providing close team supervision and 24 hour management of active medical problems listed below.  Physiatrist and rehab team continue to assess barriers to discharge/monitor patient progress toward functional and medical goals  Care Tool:  Bathing    Body parts bathed by patient: Right arm,Left arm,Chest,Abdomen,Front perineal area,Right upper leg,Left upper leg,Face   Body parts bathed by helper: Buttocks,Right lower leg,Left lower leg     Bathing assist Assist Level: Maximal Assistance - Patient 24 - 49%     Upper Body Dressing/Undressing Upper body dressing   What is the patient wearing?: Pull over shirt    Upper body assist Assist Level: Maximal Assistance - Patient 25 - 49% (sitting EOB)    Lower Body Dressing/Undressing Lower body dressing      What is the patient wearing?: Pants     Lower body assist Assist for lower body dressing: Maximal Assistance - Patient 25 - 49%     Toileting Toileting    Toileting assist Assist for toileting: Dependent - Patient 0% (3 helpers)     Transfers Chair/bed transfer  Transfers assist  Chair/bed transfer assist level: 2 Biomedical engineer   Ambulation assist      Assist level: 2 helpers Assistive device: Parallel bars Max distance: 6 ft   Walk 10 feet activity   Assist     Assist level: 2 helpers Assistive device: Hand held assist   Walk 50 feet  activity   Assist Walk 50 feet with 2 turns activity did not occur: Safety/medical concerns  Assist level: 2 helpers Assistive device: Hand held assist    Walk 150 feet activity   Assist Walk 150 feet activity did not occur: Safety/medical concerns         Walk 10 feet on uneven surface  activity   Assist Walk 10 feet on uneven surfaces activity did not occur: Safety/medical concerns         Wheelchair     Assist Will patient use wheelchair at discharge?: No             Wheelchair 50 feet with 2 turns activity    Assist            Wheelchair 150 feet activity     Assist          Blood pressure (!) 135/43, pulse (!) 59, temperature 97.6 F (36.4 C), temperature source Axillary, resp. rate 16, height 5\' 7"  (1.702 m), weight 69.2 kg, SpO2 96 %.    Medical Problem List and Plan: 1.  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 Involvement of right frontal lobe too.  Appreciate neurology recs.  -plan for home hospice 02/04/20.   -Continue CIR 2.  Antithrombotics: -DVT/anticoagulation: SCDs             -antiplatelet therapy: Aspirin 81 mg daily and Brilinta 90 mg twice daily x3 months then Brilinta alone   -sent in rx for brilinta to pharmacy as hospice apparently doesn't have this med   -neurology recommends the above regimen.  3. Pain Management: Neurontin 300 mg twice daily, Cymbalta 30 mg daily.   -has pain in right foot at night which is ongoing  - scheduled tylenol  12/20 oxycodone prn for severe pain  12/22: pain appears to be well controlled: continue current regimen.  4. Mood: Namenda 10 mg twice daily, Aricept 10 mg daily             -antipsychotic agents: N/A  -12/20 continue ritalin at 10mg  for arousal, initiation 5. Neuropsych: This patient is? capable of  making decisions on his own behalf. 6. Skin/Wound Care: Routine skin checks  -kerlix wrap  for right foot to keep ischemic toe from catching  Continue pressure relief for coccyx ulcer 7. Fluids/Electrolytes/Nutrition: family/staff to encourage PO  -12/17 Cortrak inserted 12/15. Tolerating reasonably well    -recent electrolytes ok---recheck Monday   -prealbumin low (pre-TF), 15.9, but not as bad as I feared   -continue TF/oral feeding as possible  1218- pt ate a little for breakfast- con't to encourage.   12/19-21- TF + 0-50% po intake with meals (TF on hold for now) 8.  Dysphagia.    D2 nectars-->downgraded to D1 nectars   -MBS 12/20 recommended to continue current D1 diet 9.  Hypertension.  Norvasc 10 mg daily, Coreg 6.25 mg twice daily, Cardura 4 mg daily, hydralazine 100 mg 3 times daily, HCTZ 12.5 mg  daily, Imdur 60 mg daily.   -controlled Allow some elevation for perfusion  12/21- SBP still a little elevated. No changes to plan 10.  History of prostate cancer.  Continue Proscar 5 mg daily.  -place foley prior to discharge home on 12/24 11.  Diabetes mellitus with hyperglycemia.  Hemoglobin A1c 6.0.    lantus now bid with TF running  12/19- 151-294- increase Lantus- to 18 units BID   12/21 cbg's with improvement but TF on hold now.    -will hold lantus for now  12.  Diastolic congestive heart failure.  Monitor for any signs of fluid overload   Filed Weights   01/14/20 0600 01/15/20 1005 01/21/20 0300  Weight: 72.2 kg 73.1 kg 69.2 kg   12/14 weight trending down Need new weight today   -pt weighed 71.8 kg at admission to rehab  13.  History of gout.  Allopurinol 100 mg daily.  Monitor for any gout flareups 14.  Acute on chronic anemia with heme + stool.       --transfuse if <7.5  -Continue Protonix 40 mg daily  Transfused 1 unit PRBC on 12/5  Hemoglobin 8.7 on 12/6  GI saw patient on acute and deferred endo/colonoscopy unless gross, significant bleeding  -no gross blood in  stool  12/16 transfused 1u for hgb 7.4  12/20 hgb 8.2   15.  Hyperlipidemia.  Lipitor 16.  CAD with PCI.  Continue aspirin and Brilinta for now follow-up outpatient cardiology service.  No current plan for ischemic work-up 17.  CKD stage III.  Follow-up chemistries.  Creatinine 1.42 12/10  12/10 resuming fluids as above 18. Fever. UTI and/or aspiration pneumonia   -UCX + for 100K klebsiella pneumoniae   -12/16 still have concerns about his chest, aspiration risk    -wbc's are stable at 10.9, afebrile.     -continue cefepime for now   12/20 afebrile, cxr without active disease 12/16    -dc cefepime---> bactrim ds bid continue for now   12/21 -concerns of aspiration now.     -will continue bactrim only for now.    -deep suction, oxygen to help with comfort   12/22: CXR shows no consolidation. 19. Secretions: discussed scopolamine patch, daughter would prefer to initiate outpatient.                                                          LOS: 23 days A FACE TO FACE EVALUATION WAS PERFORMED  Clide Deutscher Winfield Caba 02/02/2020, 3:31 PM

## 2020-02-02 NOTE — Progress Notes (Signed)
Speech Language Pathology Discharge Summary  Patient Details  Name: [name] MRN: [id number] Date of Birth: [birth year]  Today's Date: 02/03/2020 SLP Individual Time: 1000-1022 SLP Individual Time Calculation (min): 22 min   Skilled Therapeutic Interventions:  Pt was seen for skilled ST targeting dysphagia goals.  Pt was awake in bed upon therapist's arrival.  Nursing was at bedside and reported pt had just received pain meds prior to therapist's arrival.  Pt agreeable to sips of nectar thick coke and consumed ~4 sips with x1 episode of significant anterior loss of bolus and no overt s/s of aspiration.  Total assist was needed for feeding.  Pt reported that he didn't like the way that the coke tasted so he declined further trials.  Pt was agreeable to oral care which was provided and then he became noticeably more fatigued which SLP suspects to be related to medication taking effect.  Pt was left in bed with all needs within reach.  Education completed and pt is ready for discharge with home hospice tomorrow.    Patient has met 4 of 4 long term goals.  Patient to discharge at overall Max level.   Reasons goals not met: N/A   Clinical Impression/Discharge Summary: Patient has had a functional decline due to a change in medical status. Currently, patient is consuming Dys. 1 textures with nectar-thick liquids with intermittent overt s/s of aspiration and is overall Max A multimodal cues for use of swallowing compensatory strategies. Patient also requires Mod-Max A multimodal cues for use of speech intelligibility strategies to achieve ~75% intelligibility at the phrase level.  Max-Total A multimodal cue are also needed to complete functional and familiar tasks safely in regards to attention, problem solving and awareness. Caregiver education is complete and patient will discharge home with hospice care.  Therefore, f/u is not warranted at this time.   Care Partner:  Caregiver Able to Provide  Assistance: Yes  Type of Caregiver Assistance: Physical;Cognitive  Recommendation:  24 hour supervision/assistance;Home Health SLP  Rationale for SLP Follow Up:  (N/A due to home with hospice)   Equipment: suction   Reasons for discharge: Discharged from hospital   Patient/Family Agrees with Progress Made and Goals Achieved: Yes    [place], [place] 02/02/2020, 4:11 PM

## 2020-02-02 NOTE — Progress Notes (Addendum)
Speech Language Pathology Daily Session Note  Patient Details  Name: Frederick Marro MRN: [id number] Date of Birth: 1931-01-27  Today's Date: 02/02/2020 SLP Individual Time: 0930-1015 SLP Individual Time Calculation (min): 45 min  Short Term Goals: Week 4: SLP Short Term Goal 1 (Week 4): STG=LTG due to short ELOS  Skilled Therapeutic Interventions: Skilled treatment session focused on completion of education with the patinet's daughter and hired caregivers. SLP facilitated session by providing education regarding the patient's current swallowing function, diet recommendations, appropriate textures, compensatory strategies, medication administration and how to appropriately thicken liqiuids. The hired caregiver demonstrated how to thicken liquids to the appropriate consistency and handouts were given to reinforce information. Also dicussed results of most recent MBS with the patient's daughter in regards to aspiration of thin liquids. Provided education on how to administer thin liquids for pleasure to maximize safety (thorough oral care, liquids in between meals, water only if possible, via tsp, etc). Patient is consistently asking for thin liquids and discussed it is ultimately the family's decision as to what liquids they will provide to the patient at home based on current goals of care. SLP provided education in regards risks of aspiration as well as risk factors that put him at an increased risk of PNA such as decreased mobility and decreased cough strength which could lead to PNA and ultimately even death. The patient's daughter verbalized understanding of information provided. Patient required verbal cues to decrease oral holding with medications crushed in puree. A liquid wash of nectar-thick liquids via cup was utilized resulting in mild-moderate right anterior spillage and a delayed cough response. Recommend patient continue current diet. Patient handed off to PT. Continue with current plan of  care.      Pain Pain Assessment Pain Scale: 0-10 Pain Score: 0-No pain  Therapy/Group: Individual Therapy  Naveed Humphres 02/02/2020, 11:39 AM

## 2020-02-02 NOTE — Progress Notes (Signed)
Patient ID: Peter [name], male   DOB: 1930-04-04, 86 y.o.   MRN: [id number]  SW left message for Peter [name]/ACC [phone]) to inform on portable suction required for pt at discharge. SW waiting on follow-up.  SW met with pt dtr Peter [name] to provide updates on ambulance transport being scheduled for 9am on 12/24, and unsure on time of arrival. SW informed on portable suction needed, pt stated this item was already delivered. States all DME is being delivered to the home. Discussed FMLA. SW encouraged SW sending forms so they can be completed by physician.   SW received updates from PT that a leg sling was recommended versus the toilet sling. SW spoke with Peter [name]/Peter [name]/[facility] to discuss. Peter [name] stated will look into getting this item.   Peter [name], MSW, [place] Office: [phone] Cell: [phone] Fax: [phone]

## 2020-02-02 NOTE — Discharge Summary (Signed)
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  Given patient's acute fresh CVA lack of overt destabilizing bleeding GI evaluation was deferred for now plan was to pursue endoscopy colonoscopy as an outpatient as needed within the next 6 months.  Per old records patient did have a planned endoscopy/colonoscopy at [facility] few weeks ago but canceled due to prohibitive hypertension.  Patient did have a hemoglobin of 7.5 he was transfused 1 unit packed red blood cells 01/09/2020 follow-up hemoglobin 8.4.  Cardiology service was consulted for elevated troponin history of CAD with PCI no ischemia work-up currently planned would follow-up outpatient.  Patient currently maintained on aspirin as well as Brilinta for CVA prophylaxis x3 months then Brilinta alone.  He was maintained on a regular consistency diet with nectar thick liquids.  Due to patient's overall left side weakness slurred speech and decreased functional mobility he was admitted for a comprehensive rehab program.   Hospital Course: Mega Borgen was admitted to rehab 01/10/2020 for inpatient therapies to consist of PT, ST and OT at least three hours five days a week. Past admission physiatrist, therapy team and rehab RN have worked together to provide  customized collaborative inpatient rehab.  Patient with long complicated hospital course.  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  Latest MRI and imaging 01/15/2020 showed new from prior MRI scan 12/31/2019 and acute early subacute infarct within the right corona radiata measuring 4.5 x 1.6 cm.  New separate smaller acute early subacute infarct within the right corona radiata right subinsular white matter and right lentiform nuclear's.  Additional punctate acute early subacute infarct within the right frontal lobe motor strip.  Stable background cerebral atrophy and chronic small vessel ischemic disease.  These latest findings were reviewed by neurology services I discussed with family and the need to continue to maintain aspirin and Plavix therapy.  CT angiography of the head 01/16/2020 progressive 10 mm long near occlusive stenosis of the right M1 right MCA versus nonsegmental occlusion with some distal reconstitution.  Some enhancement seen more distally within the M1 and M2 right MCA vessels although markedly diminished as compared to the left and significantly decreases compared to the prior CT of 12/30/2019 otherwise no significant interval changes compared to prior CT of head 12/30/2019 and again patient will continue on anticoagulation therapy as advised.  Pain management with Cymbalta ongoing 30 mg daily as well as Neurontin 300 mg twice daily.  He was using oxycodone on a very limited basis for pain.  Mood stabilization with noted history of dementia he continued on Namenda and Aricept as well as Remeron 7.5 mg nightly.  Initial trials of Ritalin were initiated for arousal initiation discontinued at time of discharge.  Noted dysphagia follow-up speech therapy maintain on a dysphagia #1 nectar thick liquid diet he was initially receiving nasogastric tube feeds for nutritional support  monitoring for signs of aspiration.  Blood pressure monitored with Norvasc 10 mg daily Cardura 4 mg daily, Coreg 6.25 mg twice daily, hydralazine 100 mg every 8 hours and Imdur 60 mg daily.  Patient would need outpatient follow-up for monitoring of blood pressure.  Blood sugars monitored hemoglobin A1c 6.0 as his diet has been liberalized his Lantus insulin has been discontinued.  Diastolic congestive heart failure monitoring for any  signs of fluid overload.  He did have a history of gout he remained on allopurinol monitoring for any gout flareups.  Acute on chronic anemia Hemoccult positive stool followed by gastroenterology services awaiting plan outpatient possible need for endoscopy colonoscopy he continued on Protonix he was transfused 1 unit packed red blood cells 01/16/2024 and again 01/27/2019 2501 unit latest hemoglobin 8.2.  History of prostate cancer he remained on proscar plan was to insert Foley tube on discharge.  Lipitor ongoing for hyperlipidemia.  CKD stage III creatinine 1.69.  Hospital course Klebsiella UTI initially on cefepime transition to Bactrim and completed course.  Routine skin checks monitoring of skin right foot wrapped with Kerlix to protect ischemic toes changed daily.  Cleanse sacral wound with saline pat dry apply skin protectant wipes around the wound allowed to dry then placed a hydrocolloid dressing over the wound and change daily.  As his therapy is progressed ongoing medical conditions close discussions were held with daughter and family as palliative care continue to follow as well as hospice being involved establishing full family teaching and plan was discharged home 02/04/2020   Blood pressures were monitored on TID basis and controlled  Diabetes has been monitored with ac/hs CBG checks and SSI was use prn for tighter BS control.    Rehab course: During patient's stay in rehab weekly team conferences were held to monitor patient's progress, set goals and discuss  barriers to discharge. At admission, patient required max assist ambulate 5 feet rolling walker minimal guard supine to sit minimal assist supine to sit.  Minimal assist grooming minimal assist upper body dressing minimal assist toilet transfers  Physical exam.  Blood pressure 154/49 pulse 54 temperature 98.5 respirations 18 oxygen saturations 100% room air General.  Alert although transient provides his name.  No apparent distress HEENT Head.  Normocephalic and atraumatic Eyes.  Pupils round and reactive to light no discharge without nystagmus Neck.  Supple nontender no JVD without thyromegaly Cardiac regular rate rhythm without extra sounds or murmur heard Abdomen.  Soft nontender positive bowel sounds without rebound Neurologic.  Frail left facial droop mildly dysarthric provides his name.  Follows simple commands MSK.  Right side clawhand deformity greater than left atrophy bilateral hands and muscles  He/  has had improvement in activity tolerance, balance, postural control as well as ability to compensate for deficits. He/ has had improvement in functional use RUE/LUE  and RLE/LLE as well as improvement in awareness.  His family remain dedicated to his care for home with ongoing family teaching and transition to home with hospice care.  Patient was able to attend the left upper extremity with moderate verbal tactile cueing and on command activate finger flexion.  Patient's left upper extremity was brought through full passive range of motion total assist to maintain muscle length elasticity.  Throughout session patient would not oftentimes to sleep requiring cues to regain arousal.  He would engage in face washing and hand hygiene.  Max assist upper body dressing dependent lower body dressing dependent toileting.  Patient performed rolling right to left with max assist and cues for sequencing and reaching with his right hand for the bed rails.  He perform supine to sit with max assist of 1 person  minimal assist from a second due to some poor motor planning.  Performed beezy board transfers bed to wheelchair to bed fully deflated with max total assist of 2 total assist for board placement.  Plan was discharged to home  Disposition: Discharge to home    Diet: Dysphagia #1 nectar liquids  Special Instructions:   Wound care.  Wrapped right foot kerlex to protect ischemic toe change daily.  Cleanse sacral wound with saline pat dry.  Apply skin protectant wipes around the wound allowed to dry.  Then place a hydrocodone Lloyd dressing over the wound change daily as needed  Routine Foley catheter care  Oxygen therapy as directed  Medications at discharge 1.  Tylenol as needed 2.  Allopurinol 100 mg p.o. daily 3.  Norvasc 10 mg p.o. daily 4.  Aspirin 81 mg p.o. daily 5.  Lipitor 40 mg p.o. daily 6.  Coreg 6.25 mg twice daily 7.  Aricept 10 mg p.o. nightly 8.  Cardura 4 mg p.o. daily 9.  Cymbalta 30 mg p.o. daily 10.  Proscar 5 mg p.o. daily 11.  Neurontin 300 mg p.o. twice daily 12.  Mucinex 600 mg p.o. twice daily 13.  Hydralazine 100 mg every 8 hours 14.  Imdur 60 mg p.o. daily 15.  Namenda 10 mg p.o. twice daily 16.  Remeron 7.5 mg p.o. nightly 17.  Oxycodone 2.5 mg p.o. every 8 hours as needed severe pain 18.  Protonix 40 mg p.o. daily 19.  Brilinta 90 mg p.o. twice daily  30-35 minutes were spent completing discharge summary and discharge planning  Discharge Instructions    Ambulatory referral to Neurology   Complete by: As directed    An appointment is requested in approximately 4 weeks right MCA infarction     Allergies as of 02/04/2020      Reactions   Penicillins Anaphylaxis   Reported airway compromise 50 years      Medication List    STOP taking these medications   alendronate 70 MG tablet Commonly known as: FOSAMAX   ferrous sulfate 325 (65 FE) MG tablet   gabapentin 300 MG capsule Commonly known as: NEURONTIN Replaced by: gabapentin  600 MG tablet   hydrochlorothiazide 12.5 MG capsule Commonly known as: MICROZIDE   insulin glargine 100 UNIT/ML injection Commonly known as: Lantus   Insulin Syringe-Needle U-100 31G X 5/16" 1 ML Misc Commonly known as: TRUEplus Insulin Syringe   Lantus 100 UNIT/ML injection Generic drug: insulin glargine   Resource ThickenUp Clear Powd     TAKE these medications   acetaminophen 325 MG tablet Commonly known as: TYLENOL Take 2 tablets (650 mg total) by mouth every 6 (six) hours as needed for mild pain.   acetaminophen 160 MG/5ML solution Commonly known as: TYLENOL Take 10.2 mLs (325 mg total) by mouth every 8 (eight) hours as needed (severe pain).   allopurinol 100 MG tablet Commonly known as: ZYLOPRIM Take 1 tablet (100 mg total) by mouth daily.   amLODipine 10 MG tablet Commonly known as: NORVASC Take 1 tablet (10 mg total) by mouth daily.   aspirin EC 81 MG tablet Take 81 mg by mouth every evening.   atorvastatin 80 MG tablet Commonly known as: LIPITOR Take 0.5 tablets (40 mg total) by mouth daily.   carvedilol 6.25 MG tablet Commonly known as: COREG Take 1 tablet (6.25 mg total) by mouth 2 (two) times daily with a meal.   donepezil 10 MG tablet Commonly known as: ARICEPT Take 1 tablet (10 mg total) by mouth daily.   doxazosin 4 MG tablet Commonly known as: CARDURA Take 1 tablet (4 mg total) by mouth daily. What changed: See the new instructions.   DULoxetine 30 MG capsule Commonly known as: CYMBALTA Take  1 capsule (30 mg total) by mouth daily.   finasteride 5 MG tablet Commonly known as: PROSCAR Take 1 tablet (5 mg total) by mouth daily.   gabapentin 600 MG tablet Commonly known as: NEURONTIN Take 0.5 tablets (300 mg total) by mouth 2 (two) times daily. Replaces: gabapentin 300 MG capsule   guaiFENesin 600 MG 12 hr tablet Commonly known as: MUCINEX Take 1 tablet (600 mg total) by mouth 2 (two) times daily.   hydrALAZINE 100 MG tablet Commonly  known as: APRESOLINE Take 1 tablet (100 mg total) by mouth every 8 (eight) hours. What changed: when to take this   isosorbide mononitrate 60 MG 24 hr tablet Commonly known as: IMDUR Take 1 tablet (60 mg total) by mouth daily.   memantine 10 MG tablet Commonly known as: NAMENDA Take 1 tablet (10 mg total) by mouth 2 (two) times daily. What changed: when to take this   mirtazapine 7.5 MG tablet Commonly known as: REMERON Take 1 tablet (7.5 mg total) by mouth at bedtime.   nitroGLYCERIN 0.4 MG SL tablet Commonly known as: NITROSTAT Place 1 tablet (0.4 mg total) under the tongue every 5 (five) minutes x 3 doses as needed for chest pain.   oxyCODONE 5 MG/5ML solution Commonly known as: ROXICODONE Take 2.5 mLs (2.5 mg total) by mouth every 8 (eight) hours as needed for severe pain.   pantoprazole 40 MG tablet Commonly known as: PROTONIX Take 1 tablet (40 mg total) by mouth daily.   ticagrelor 90 MG Tabs tablet Commonly known as: BRILINTA Take 1 tablet (90 mg total) by mouth 2 (two) times daily.       Follow-up Information    [name], MD Follow up.   Specialty: Gastroenterology Why: Call for appointment Contact information: [place] [address] [phone]        [name], MD Follow up.   Specialty: Cardiology Why: Call for appointment Contact information: [place] [address] [phone]        [facility]. Schedule an appointment as soon as possible for a visit in 4 week(s).   Specialty: Neurology Contact information: [address] [place] [phone]       Meredith Staggers, MD Follow up.   Specialty: Physical Medicine and Rehabilitation Why: no follow up needed Contact information: [address] [place] [address] [phone]               Signed: Lavon Paganini [place] 02/04/2020, 5:15 AM

## 2020-02-02 NOTE — Progress Notes (Signed)
Occupational Therapy Session Note  Patient Details  Name: Peter [name] MRN: [id number] Date of Birth: Mar 31, 1930  Today's Date: 02/02/2020 OT Individual Time: 6433-2951 OT Individual Time Calculation (min): 48 min    Skilled Therapeutic Interventions/Progress Updates:    Pt, daughter-Peter [name], Nurse from agency, and live in tech-[employer] present for family education. OT reviews bed level ADLS, protective-hemi positioning for LUE, dressing, hemi techniques, rolling, and HOB >30* for resting with tube feeds. Vary, CNA, was hesitant to get involved and required coaching for rolling, positioning and clothing management. Educated on allowing pt to cleanse peri area when appropriate. Pants donned total A +2. Gown changed with total A. Emailed PROM handout to pt daughter with PT PROM handout. Would recommend RN/CNA continued practice with daughter at home for reinforcement of strategies. Exited session with pt seated in bed, exit alarm on and call light in reach   Therapy Documentation Precautions:  Precautions Precautions: Fall Precaution Comments: Dense L hemiplegia and hemiperisis Restrictions Weight Bearing Restrictions: No General:   Vital Signs: Therapy Vitals Temp: 98.2 F (36.8 C) Pulse Rate: (!) 59 Resp: 16 BP: (!) 149/36 Patient Position (if appropriate): Lying Oxygen Therapy SpO2: 96 % O2 Device: Nasal Cannula Pain:   ADL: ADL Upper Body Dressing: Maximal assistance Lower Body Dressing: Dependent Where Assessed-Lower Body Dressing: Edge of bed Toileting: Dependent Where Assessed-Toileting: Bed level Vision Baseline Vision/History: Wears glasses Wears Glasses: At all times Patient Visual Report: No change from baseline Vision Assessment?: No apparent visual deficits Perception  Perception: Impaired Inattention/Neglect: Does not attend to left visual field;Does not attend to left side of body Praxis Praxis: Impaired Praxis Impairment Details: Motor  planning;Initiation;Perseveration Praxis-Other Comments: Praxis impairments correlated to patient's medical and functional decline during admission Exercises:   Other Treatments:     Therapy/Group: Individual Therapy  Peter [name] 02/02/2020, 6:58 AM

## 2020-02-02 NOTE — Progress Notes (Signed)
Physical Therapy Discharge Summary  Patient Details  Name: Peter [name] MRN: [id number] Date of Birth: 04-13-1930  Today's Date: 02/02/2020 PT Individual Time: 1400-1430 PT Individual Time Calculation (min): 30 min    Patient has met 0 of 6 long term goals due to decline in function throughout patient's stay secondary to extension/new CVAs. Patient is limited by dense L hemiplegia, L inattention, decreased arousal and cognition, and generalized weakness with decreased activity tolerance. D/c plan for patient to return home with hospice care. Patient to discharge at dependent level with hospital bed, hoyer lift, and TIS wheelchair. Patient's care partners are independent to provide the necessary physical and cognitive assistance at discharge.  Reasons goals not met: Patient with medical and functional decline throughout patient's stay. Updated plan for patient to return home with family with hospice support. Patient's family and caregivers have participated in family education and are dedicated to caring for him at home at this time.    Recommendation:  Patient will benefit from ongoing services from home hospice to continue to address ongoing impairments in functional mobility, ROM, OOB tolerance, positioning to prevent skin breakdown, cognitive remediation, patient/family education, and minimize fall risk.  Equipment: Equipment provided by hospice care (hospital bed), patient's daughter to obtain loaner TIS w/c from outside [place] (provided w/c recommendations during patient's stay)  Reasons for discharge: discharge from hospital  Patient/family agrees with progress made and goals achieved: Yes  Skilled Therapeutic Intervention: Patient in bed attempting to call his wife on the phone on 2L/min O2 upon PT arrival. Patient alert and agreeable to PT session. Patient did not report pain during session.  Patient stated he was trying to call his wife, but it was not going through. Asked  patient the phone number and the number he provided was not enough digits to make a call. He was agreeable to have therapist assist with calling his wife at end of session. Performed bed level d/c assessment, see details below. Provided patient sips of nectar thick water throughout due to "dry mouth." Cued patient to tuck his chin and perform dry swallows between sips. Patient asked questions about d/c tomorrow. PT educated him on POC and transition to hospice care at home. Patient reported feeling excited to be returning home for Christmas and to be with his family. Patient's questions were very appropriate. Required cues for enunciation due to dysarthria throughout. Provided orientation to time, as patient unaware of the day of the week. Patient then requesting to call his wife. Patient continued to provide fewer digits than necessary, looked up number in patient's chart, and dialed for patient, however there was no answer at this time. Patient stated he would try again later, RN made aware.   Patient in bed with his eyes closed at end of session with breaks locked, bed alarm set, and all needs within reach.    PT Discharge Precautions/Restrictions Precautions Precautions: Fall Precaution Comments: Dense L hemiplegia and hemiperisis Restrictions Weight Bearing Restrictions: No Vision/Perception  Perception Perception: Impaired Inattention/Neglect: Does not attend to left visual field;Does not attend to left side of body Praxis Praxis: Impaired Praxis Impairment Details: Motor planning;Initiation;Perseveration Praxis-Other Comments: Praxis impairments correlated to patient's medical and functional decline during admission  Cognition Overall Cognitive Status: Impaired/Different from baseline Arousal/Alertness: Lethargic Attention: Sustained Sustained Attention: Impaired Memory: Impaired Memory Impairment: Decreased recall of new information Awareness: Impaired Problem Solving:  Impaired Safety/Judgment: Impaired Sensation Sensation Light Touch: Impaired Detail Light Touch Impaired Details: Absent LLE;Absent LUE Proprioception: Impaired Detail Proprioception Impaired Details:  Absent LUE;Absent LLE Coordination Gross Motor Movements are Fluid and Coordinated: No Fine Motor Movements are Fluid and Coordinated: No Coordination and Movement Description: Patient with dense L hemiplegia and decreased motor planning and initiation limiting mobiltiy Motor  Motor Motor: Abnormal tone;Hemiplegia;Abnormal postural alignment and control Motor - Discharge Observations: Overall decline in function and worsening of deficits throughout patient's stay  Mobility Bed Mobility Bed Mobility: Rolling Right;Rolling Left;Supine to Sit;Sit to Supine Rolling Right: Maximal Assistance - Patient 25-49%;Total Assistance - Patient < 25% Rolling Left: Maximal Assistance - Patient 25-49% Supine to Sit: 2 Helpers Sit to Supine: 2 Helpers Transfers Transfers: Lateral/Scoot Transfers Sit to Stand:  (using beezy board or manual or mechanical lift) Lateral/Scoot Transfers: Total Assistance - Patient < 25%;Dependent - Patient 0%;2 Helpers;Dependent - mechanical lift (using beezy board or manual or mechanical lift) Locomotion  Gait Ambulation: No (Patient unable to ambulate at d/c due to functional decline) Wheelchair Mobility Wheelchair Mobility: No (Pateint unable to self propel manual w/c or power w/c safely due to worsening L sided weakness, decreased arousal, and fluctuation is cognitive deficits)  Trunk/Postural Assessment  Cervical Assessment Cervical Assessment: Exceptions to [facility] (significant forward head posture) Thoracic Assessment Thoracic Assessment: Exceptions to [facility] (significant kyphotic posture) Lumbar Assessment Lumbar Assessment: Exceptions to [facility] (posterior pelvic tilt) Postural Control Postural Control: Deficits on evaluation Trunk Control: leans and pushes to L in  sitting, able to atain midline intermittently with mod-max A when arousal and attention are good Righting Reactions: delayed throughout  Balance Balance Balance Assessed: Yes Static Sitting Balance Static Sitting - Balance Support: Feet supported Static Sitting - Level of Assistance: 3: Mod assist;2: Max assist Dynamic Sitting Balance Dynamic Sitting - Level of Assistance: 2: Max assist;1: +2 Total assist Extremity Assessment  RLE Assessment Passive Range of Motion (PROM) Comments: Increased tone intermittently Active Range of Motion (AROM) Comments: Generalized tightness throughout General Strength Comments: Grossly at least 3+/5 bed level, caution around wounds/very poor integrity of remaining toe. LLE Assessment LLE Assessment: Exceptions to [facility] Passive Range of Motion (PROM) Comments: flaccid, WFL Active Range of Motion (AROM) Comments: no active motor activation General Strength Comments: 0/5 throughout, no palpable motor response during tesing, decreased attention to L side of body    Mayo Owczarzak L Viviene Thurston PT, DPT  02/03/2020, 5:40 PM

## 2020-02-02 NOTE — Discharge Instructions (Signed)
Inpatient Rehab Discharge Instructions  [facility] Discharge date and time: No discharge date for patient encounter.   Activities/Precautions/ Functional Status: Activity: activity as tolerated Diet: Dysphagia 1 nectar liquids Skin.  Routine skin checks  STROKE/TIA DISCHARGE INSTRUCTIONS SMOKING Cigarette smoking nearly doubles your risk of having a stroke & is the single most alterable risk factor  If you smoke or have smoked in the last 12 months, you are advised to quit smoking for your health.  Most of the excess cardiovascular risk related to smoking disappears within a year of stopping.  Ask you doctor about anti-smoking medications  [place] Quit Line: 1-800-QUIT NOW  Free Smoking Cessation Classes [phone]  CHOLESTEROL Know your levels; limit fat & cholesterol in your diet  Lipid Panel     Component Value Date/Time   CHOL 85 12/31/2019 0318   TRIG 152 (H) 12/31/2019 0318   HDL 44 12/31/2019 0318   CHOLHDL 1.9 12/31/2019 0318   VLDL 30 12/31/2019 0318   LDLCALC 11 12/31/2019 0318      Many patients benefit from treatment even if their cholesterol is at goal.  Goal: Total Cholesterol (CHOL) less than 160  Goal:  Triglycerides (TRIG) less than 150  Goal:  HDL greater than 40  Goal:  LDL (LDLCALC) less than 100   BLOOD PRESSURE American Stroke Association blood pressure target is less that 120/80 mm/Hg  Your discharge blood pressure is:  BP: (!) 154/49  Monitor your blood pressure  Limit your salt and alcohol intake  Many individuals will require more than one medication for high blood pressure  DIABETES (A1c is a blood sugar average for last 3 months) Goal HGBA1c is under 7% (HBGA1c is blood sugar average for last 3 months)  Diabetes:    Lab Results  Component Value Date   HGBA1C 6.0 (H) 12/31/2019     Your HGBA1c can be lowered with medications, healthy diet, and exercise.  Check your blood sugar as directed by your physician  Call your  physician if you experience unexplained or low blood sugars.  PHYSICAL ACTIVITY/REHABILITATION Goal is 30 minutes at least 4 days per week  Activity: Increase activity slowly, Therapies: Physical Therapy: Home Health Return to work:   Activity decreases your risk of heart attack and stroke and makes your heart stronger.  It helps control your weight and blood pressure; helps you relax and can improve your mood.  Participate in a regular exercise program.  Talk with your doctor about the best form of exercise for you (dancing, walking, swimming, cycling).  DIET/WEIGHT Goal is to maintain a healthy weight  Your discharge diet is:  Diet Order    None      liquids Your height is:    Your current weight is:   Your Body Mass Index (BMI) is:     Following the type of diet specifically designed for you will help prevent another stroke.  Your goal weight range is:    Your goal Body Mass Index (BMI) is 19-24.  Healthy food habits can help reduce 3 risk factors for stroke:  High cholesterol, hypertension, and excess weight.  RESOURCES Stroke/Support Group:  Call [phone]   STROKE EDUCATION PROVIDED/REVIEWED AND GIVEN TO PATIENT Stroke warning signs and symptoms How to activate emergency medical system (call 911). Medications prescribed at discharge. Need for follow-up after discharge. Personal risk factors for stroke. Pneumonia vaccine given:  Flu vaccine given:  My questions have been answered, the writing is legible, and I understand these  instructions.  I will adhere to these goals & educational materials that have been provided to me after my discharge from the hospital.    Wound Care: Routine skin checks Functional status:  ___ No restrictions     ___ Walk up steps independently ___ 24/7 supervision/assistance   ___ Walk up steps with assistance ___ Intermittent supervision/assistance  ___ Bathe/dress independently ___ Walk with walker     _x__ Bathe/dress with  assistance ___ Walk Independently    ___ Shower independently ___ Walk with assistance    ___ Shower with assistance ___ No alcohol     ___ Return to work/school ________  Special Instructions: No driving smoking or alcohol   Plan is for aspirin 81 mg daily and Brilinta 90 mg twice daily x3 months total then Brilinta alone  Routine Foley catheter care  Wound care.  Wrapped right foot kerlix to protect ischemic toe change daily   Cleanse sacral wound with saline pat dry.  Apply skin protectant wipes around the wounds allowed to dry.  Then place a hydrocolloid dressing over the wound change daily as needed   My questions have been answered and I understand these instructions. I will adhere to these goals and the provided educational materials after my discharge from the hospital.  Patient/Caregiver Signature _______________________________ Date __________  Clinician Signature _______________________________________ Date __________  Please bring this form and your medication list with you to all your follow-up doctor's appointments.

## 2020-02-02 NOTE — Progress Notes (Signed)
Occupational Therapy Discharge Summary  Patient Details  Name: Peter [name] MRN: [id number] Date of Birth: 1930/09/13  Today's Date: 02/03/2020 OT Individual Time: 0700-0810 OT Individual Time Calculation (min): 70 min   Pt received in bed awake to auditory stimuli. Pt agreeable to shower. Maximove attained and pt rolls in B directions with MOD-+2 A to place sling with increased time to allow pt to process and execute commands. Dependent transfer to/from TIS roll in shower chair with cut out. Pt able ot wash chest, abdomen, peri area, face, B thighs and LUE with cueing. OT washes B feet, buttocks and provides HOH A for LUE to wash R with trace activation in pec and biceps during functional activity. Pt returns to bed for bed level dressing requiring total A to don pants and gown. Pt requesting water, hwoever by the time OT retrieves water and thickens to appropriate thickness pt audibly snoring and not safe to complete feeding at this time. Exited session with pt seated in bed, exit alarm on and call light in reach    Patient has met 0 of 12 long term goals. Patient to discharge at overall Total Assist level d/t multiple extensions/new CVAs impacting CLOF. Pt continues to be limited by dense L hemiplegia, L inattention, decreased cognition and decreased arousal. One of Patient's care partner (daughter-[name] is an OT) is independent to provide the necessary physical and cognitive assistance at discharge.  Education was provided to daughter, home nurse and Live in aide. Aide and RN would benefit from continued practice to improve safety and ease of care.   Reasons goals not met: Pt had many new CVAs during LOS impacting functional performance and progress. Pt currently requires total A of 1-2 for bed mobility, ADLs, and hoyer transfers to TIS.  Recommendation:  Patient discharging home with hospice and comfort care to be with family. PROM program provided.   Equipment: No OT equipment  provided; hoyer, TIS and hospital bed   Reasons for discharge: discharge from hospital  Patient/family agrees with progress made and goals achieved: Yes  OT Discharge Precautions/Restrictions  Precautions Precautions: Fall Precaution Comments: Dense L hemiplegia and hemiperisis General   Vital Signs Therapy Vitals Temp: 97.6 F (36.4 C) Temp Source: Axillary Pulse Rate: (!) 59 Resp: 16 BP: (!) 135/43 Patient Position (if appropriate): Sitting Oxygen Therapy SpO2: 96 % O2 Device: Nasal Cannula O2 Flow Rate (L/min): 2 L/min Pain Pain Assessment Pain Score: 0-No pain ADL ADL Eating: Maximal assistance Grooming: Maximal assistance (suction) Where Assessed-Grooming: Bed level Upper Body Bathing: Moderate assistance Where Assessed-Upper Body Bathing: Shower (TIS shower chair) Lower Body Bathing: Maximal assistance Where Assessed-Lower Body Bathing: Shower (TIS shower chair) Upper Body Dressing: Maximal assistance Where Assessed-Upper Body Dressing: Bed level Lower Body Dressing: Dependent Where Assessed-Lower Body Dressing: Bed level Toileting: Dependent Where Assessed-Toileting: Bed level Vision Baseline Vision/History: Wears glasses Wears Glasses: At all times Patient Visual Report: No change from baseline Vision Assessment?: No apparent visual deficits Perception  Perception: Impaired Inattention/Neglect: Does not attend to left visual field;Does not attend to left side of body Praxis Praxis Impairment Details: Motor planning;Initiation;Perseveration Cognition Overall Cognitive Status: Impaired/Different from baseline Arousal/Alertness: Lethargic Orientation Level: Oriented to person Attention: Sustained Sustained Attention: Impaired Sustained Attention Impairment: Verbal basic;Functional basic Memory: Impaired Memory Impairment: Decreased recall of new information Awareness: Impaired Problem Solving: Impaired Safety/Judgment:  Impaired Sensation Sensation Light Touch: Impaired Detail Light Touch Impaired Details: Absent LLE;Absent LUE Proprioception: Impaired Detail Proprioception Impaired Details: Absent LUE;Absent LLE Coordination Gross Motor Movements  are Fluid and Coordinated: No Fine Motor Movements are Fluid and Coordinated: No Coordination and Movement Description: Patient with dense L hemiplegia and decreased motor planning and initiation limiting mobiltiy Motor  Motor Motor: Abnormal tone;Hemiplegia;Abnormal postural alignment and control Motor - Skilled Clinical Observations: wasting bilat hands w/hx ulnar n issues, L sided weakness Motor - Discharge Observations: Overall decline in function and worsening of deficits throughout patient's stay Mobility  Bed Mobility Bed Mobility: Rolling Right;Rolling Left;Supine to Sit;Sit to Supine Rolling Right: Maximal Assistance - Patient 25-49%;Total Assistance - Patient < 25% Rolling Left: Maximal Assistance - Patient 25-49% Left Sidelying to Sit: Maximal Assistance - Patient 25-49% Supine to Sit: 2 Helpers Sit to Supine: 2 Helpers Sit to Sidelying Left: Maximal Assistance - Patient 25-49% Transfers Stand to Sit: Maximal Assistance - Patient 25-49%  Trunk/Postural Assessment  Cervical Assessment Cervical Assessment: Exceptions to [facility] Thoracic Assessment Thoracic Assessment: Exceptions to [facility] Lumbar Assessment Lumbar Assessment: Exceptions to [facility] Postural Control Trunk Control: leans and pushes to L in sitting, able to atain midline intermittently with mod-max A when arousal and attention are good Righting Reactions: delayed throughout  Balance Static Sitting Balance Static Sitting - Balance Support: Feet supported Static Sitting - Level of Assistance: 3: Mod assist;2: Max assist Dynamic Sitting Balance Dynamic Sitting - Balance Support: Bilateral upper extremity supported Dynamic Sitting - Level of Assistance: 2: Max assist;1: +2 Total  assist Sitting balance - Comments: Mod A sitting balance EOB for ADL tasks Static Standing Balance Static Standing - Balance Support: Bilateral upper extremity supported Static Standing - Level of Assistance: 2: Max assist Dynamic Standing Balance Dynamic Standing - Balance Support: Bilateral upper extremity supported Extremity/Trunk Assessment RUE Assessment RUE Assessment: Exceptions to [facility] General Strength Comments: generalized weakness throughout, wasting at hands due to ulnar n. dysfunction, roughly 4-/5 LUE Assessment LUE Assessment: Exceptions to [facility] General Strength Comments: flaccid LUE LUE Body System: Neuro Brunstrum levels for arm and hand: Arm;Hand Brunstrum level for arm: Stage I Presynergy Brunstrum level for hand: Stage I Flaccidity   Tonny Branch 02/02/2020, 4:42 PM

## 2020-02-02 NOTE — Progress Notes (Addendum)
Physical Therapy Session Note  Patient Details  Name: Peter [name] MRN: [id number] Date of Birth: Apr 13, 1930  Today's Date: 02/02/2020 PT Individual Time: 1015-1120 PT Individual Time Calculation (min): 65 min   Short Term Goals: Week 3:  PT Short Term Goal 1 (Week 3): Patient will perform bed mobility with mod A with use of hospital bed features. PT Short Term Goal 2 (Week 3): Patient will perform basic transfers with max A of 1 person consistently.  Skilled Therapeutic Interventions/Progress Updates:     Patient in bed with his daughter, Peter [name], and the live-in aid and RN that will be assisting with the patient's care at home upon PT arrival. Patient alert with intermittent lethargy with eyes closed when flat in the bed or tilted back in the TIS w/c. Patient reported mild/moderate R toe/foot pain during session, RN made aware. PT provided repositioning, rest breaks, and distraction as pain interventions throughout session.   Patient on 2L O2 throughout session. Performed transfer with O2 doffed with saturation levels between 87-93%. Remained >90% on 2L/min. Tube feed placed on hold and disconnected during transfer and reconnected and started again at end of session.   Focused session on [facility] lift training in preparation for transition home. Demonstrated parts and sling placement independently prior to initiating transfer with patient. Demonstrated and provided cues for sling placement under patient and rolling patient with total A +2. With hand over hand assist patient able to hold on to the bed rail on the L with his R hand to maintain L side-lying during sling placement. PT demonstrated sling strap placement depending upon transition bed to chair versus chair to bed. Demonstrated and cues aid on using manual lift to raise the patient up from the bed with the bed on the lowest setting for improved clearance. Once the patient was off the bed, x-ray arrived to perform a chest x-ray on the  patient in the room. The patient was returned to lying in the bed, during which the valve to lower the lift was stuck and required increased effort to turn, resulting in a sudden lowering of the patient that was quickly controlled, patient reported mild neck soreness after, RN made aware. Patientand everyone stepped out of the room x5 min as x-ray was performed.  Returned to patient in the bed with the sling still under him. The aid and his daughter placed the lift over the bed and placed the straps as instructed before without cues. The aid lifted the patient using the manual lift then PT had to assist with bringing the patient off the bed due to the air mattress height, even fully deflated. Demonstrated and cues for +2 maneuvering of patient, lift, and TIS w/c for transfer. Instructed and demonstrated how to turn the patient in the sling and push at his knees and pull up on the sling to place his hips back into the w/c. Required 2 attempts for positioning. Demonstrated shifting the patient in the chair performing reciprocal scooting x1 for improved positioning. Educated on safety risks and importance of positioning throughout, Cued patient and his daughter on how to remove the sling from under the patient when in the chair and instructed on how to place the sling when patient is going to return to the bed. Did not perform w/c>bed transfer due to time constraints and patient fatigue. Applied full lap tray with pillow for L upper extremity positioning and tilted patient back in the chair for improved head support and reduced sacral pressure. Discussed differences between  Hoyer lift used during [employer] at home, patient's daughter with pictures.   Encouraged further practice for all in attendence prior to patient's d/c. Daughter reports plans for further trials with her mother in the sling at home and RN and aid plan for continued training with their agency's equipment prior to d/c.   Emailed patient's  daughter upper and lower extremity HEP after session.  Patient in TIS w/c on 2L/min O2 with tube feed running and music playing at end of session with breaks locked and all needs within reach.    Therapy Documentation Precautions:  Precautions Precautions: Fall Precaution Comments: Dense L hemiplegia and hemiperisis Restrictions Weight Bearing Restrictions: No   Therapy/Group: Individual Therapy  Peter [name] L Peter [name] PT, DPT  02/02/2020, 12:56 PM

## 2020-02-03 ENCOUNTER — Inpatient Hospital Stay (HOSPITAL_COMMUNITY): Payer: Medicare Other | Admitting: Speech Pathology

## 2020-02-03 ENCOUNTER — Inpatient Hospital Stay (HOSPITAL_COMMUNITY): Payer: Medicare Other

## 2020-02-03 LAB — GLUCOSE, CAPILLARY
Glucose-Capillary: 188 mg/dL — ABNORMAL HIGH (ref 70–99)
Glucose-Capillary: 205 mg/dL — ABNORMAL HIGH (ref 70–99)
Glucose-Capillary: 242 mg/dL — ABNORMAL HIGH (ref 70–99)
Glucose-Capillary: 259 mg/dL — ABNORMAL HIGH (ref 70–99)
Glucose-Capillary: 261 mg/dL — ABNORMAL HIGH (ref 70–99)

## 2020-02-03 MED ORDER — RESOURCE THICKENUP CLEAR PO POWD
ORAL | Status: DC | PRN
  Filled 2020-02-03: qty 125

## 2020-02-03 NOTE — Progress Notes (Signed)
[place] PHYSICAL MEDICINE & REHABILITATION PROGRESS NOTE   Subjective/Complaints: Complains of some pain this morning- discussed with RN giving his oxy 2.5 Discussed with hospice nurse that daughter prefers no O2 at home if satting well. Systolic BP slightly elevated Afebrile  ROS: limited d/t cognition, arousal  Objective:   DG Chest Port 1 View  Result Date: 02/02/2020 CLINICAL DATA:  Cough. EXAM: PORTABLE CHEST 1 VIEW COMPARISON:  January 27, 2020. FINDINGS: The heart size and mediastinal contours are within normal limits. Pulmonary vascular congestion. Coronary stent. Loop recorder projects over the left chest. Enteric tube courses below the diaphragm in outside the field of view. No consolidation. No visible pleural effusions or pneumothorax. No acute osseous abnormality. IMPRESSION: Pulmonary vascular congestion without overt pulmonary edema. No consolidation. Electronically Signed   By: Margaretha Sheffield MD   On: 02/02/2020 10:57   No results for input(s): WBC, HGB, HCT, PLT in the last 72 hours. No results for input(s): NA, K, CL, CO2, GLUCOSE, BUN, CREATININE, CALCIUM in the last 72 hours.  Intake/Output Summary (Last 24 hours) at 02/03/2020 1133 Last data filed at 02/03/2020 [id number] Gross per 24 hour  Intake 80 ml  Output --  Net 80 ml    Physical Exam: Vital Signs Blood pressure (!) 157/47, pulse 64, temperature (!) 97.5 F (36.4 C), resp. rate 16, height 5\' 7"  (1.702 m), weight 69.2 kg, SpO2 98 %.  Gen: no distress, normal appearing HEENT: oral mucosa pink and moist, NCAT Cardio: Reg rate Chest: normal effort, normal rate of breathing Abd: soft, non-distended Ext: no edema Skin: intact Psych: pleasant but more fatigued appearing today. Skin: redness at sacrum. Necrotic right 2nd toe stable in appearance Musc: No edema in extremities.  No tenderness in extremities. Intrinsic muscle atrophy bilateral hands Neuro: dysarthric speech. Alert at times but then can  quickly fall asleep. Left central 7, visual field cut and inattention. Motor: RUE/RLE: 4-4+ /5 proximal distal LUE: 0/5 proximal with inattention. Decreased LT LUE>LLE LLE: 1 to 2/5 proximal to distal with cueing required to trigger  Assessment/Plan: 1. Functional deficits which require 3+ hours per day of interdisciplinary therapy in a comprehensive inpatient rehab setting.  Physiatrist is providing close team supervision and 24 hour management of active medical problems listed below.  Physiatrist and rehab team continue to assess barriers to discharge/monitor patient progress toward functional and medical goals  Care Tool:  Bathing    Body parts bathed by patient: Left arm,Chest,Abdomen,Front perineal area,Right upper leg,Left upper leg,Face   Body parts bathed by helper: Right arm     Bathing assist Assist Level: Moderate Assistance - Patient 50 - 74% (TIS shower chair)     Upper Body Dressing/Undressing Upper body dressing   What is the patient wearing?: Pull over shirt    Upper body assist Assist Level: Maximal Assistance - Patient 25 - 49%    Lower Body Dressing/Undressing Lower body dressing      What is the patient wearing?: Pants     Lower body assist Assist for lower body dressing: Total Assistance - Patient < 25%     Toileting Toileting    Toileting assist Assist for toileting: Dependent - Patient 0%     Transfers Chair/bed transfer  Transfers assist     Chair/bed transfer assist level: 2 Helpers     Locomotion Ambulation   Ambulation assist      Assist level: 2 helpers Assistive device: Parallel bars Max distance: 6 ft   Walk 10 feet activity  Assist     Assist level: 2 helpers Assistive device: Hand held assist   Walk 50 feet activity   Assist Walk 50 feet with 2 turns activity did not occur: Safety/medical concerns  Assist level: 2 helpers Assistive device: Hand held assist    Walk 150 feet activity   Assist Walk  150 feet activity did not occur: Safety/medical concerns         Walk 10 feet on uneven surface  activity   Assist Walk 10 feet on uneven surfaces activity did not occur: Safety/medical concerns         Wheelchair     Assist Will patient use wheelchair at discharge?: No             Wheelchair 50 feet with 2 turns activity    Assist            Wheelchair 150 feet activity     Assist          Blood pressure (!) 157/47, pulse 64, temperature (!) 97.5 F (36.4 C), resp. rate 16, height 5\' 7"  (1.702 m), weight 69.2 kg, SpO2 98 %.    Medical Problem List and Plan: 1.  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 Involvement of right frontal lobe too.  Appreciate neurology recs.  -plan for home hospice 02/04/20.   -Continue CIR 2.  Antithrombotics: -DVT/anticoagulation: SCDs             -antiplatelet therapy: Aspirin 81 mg daily and Brilinta 90 mg twice daily x3 months then Brilinta alone   -sent in rx for brilinta to pharmacy as hospice apparently doesn't have this med   -neurology recommends the above regimen.  3. Pain Management: Neurontin 300 mg twice daily, Cymbalta 30 mg daily.   -has pain in right foot at night which is ongoing  - scheduled tylenol  12/20 oxycodone prn for severe pain  12/23: discussed giving oxycodone for pain he has today.  4. Mood: Namenda 10 mg twice daily, Aricept 10 mg daily             -antipsychotic agents: N/A  -12/20 continue ritalin at 10mg  for arousal, initiation 5. Neuropsych: This patient is? capable of making decisions on his own behalf. 6. Skin/Wound Care: Routine skin checks  -kerlix wrap  for right foot to keep ischemic toe from catching  Continue pressure relief for coccyx ulcer 7. Fluids/Electrolytes/Nutrition: family/staff to encourage  PO  -12/17 Cortrak inserted 12/15. Tolerating reasonably well    -recent electrolytes ok---recheck Monday   -prealbumin low (pre-TF), 15.9, but not as bad as I feared   -continue TF/oral feeding as possible  1218- pt ate a little for breakfast- con't to encourage.   12/19-21- TF + 0-50% po intake with meals (TF on hold for now) 8.  Dysphagia.    D2 nectars-->downgraded to D1 nectars   -MBS 12/20 recommended to continue current D1 diet 9.  Hypertension.  Norvasc 10 mg daily, Coreg 6.25 mg twice daily, Cardura 4 mg daily, hydralazine 100 mg 3 times daily, HCTZ 12.5 mg daily, Imdur 60 mg daily.   -controlled Allow some elevation for perfusion  12/21- SBP still a little elevated. No changes to plan 10.  History of prostate cancer.  Continue Proscar 5 mg daily.  -place foley prior to discharge home on 12/24  11.  Diabetes mellitus with hyperglycemia.  Hemoglobin A1c 6.0.    lantus now bid with TF running  12/19- 151-294- increase Lantus- to 18 units BID   12/21 cbg's with improvement but TF on hold now.    -will hold lantus for now  12.  Diastolic congestive heart failure.  Monitor for any signs of fluid overload   Filed Weights   01/14/20 0600 01/15/20 1005 01/21/20 0300  Weight: 72.2 kg 73.1 kg 69.2 kg   12/14 weight trending down Need new weight today   -pt weighed 71.8 kg at admission to rehab  13.  History of gout.  Allopurinol 100 mg daily.  Monitor for any gout flareups 14.  Acute on chronic anemia with heme + stool.       --transfuse if <7.5  -Continue Protonix 40 mg daily  Transfused 1 unit PRBC on 12/5  Hemoglobin 8.7 on 12/6  GI saw patient on acute and deferred endo/colonoscopy unless gross, significant bleeding  -no gross blood in stool  12/16 transfused 1u for hgb 7.4  12/20 hgb 8.2   15.  Hyperlipidemia.  Lipitor 16.  CAD with PCI.  Continue aspirin and Brilinta for now follow-up outpatient cardiology service.  No current plan for ischemic work-up 17.  CKD stage III.   Follow-up chemistries.  Creatinine 1.42 12/10  12/10 resuming fluids as above 18. Fever. UTI and/or aspiration pneumonia   -UCX + for 100K klebsiella pneumoniae   -12/16 still have concerns about his chest, aspiration risk    -wbc's are stable at 10.9, afebrile.     -continue cefepime for now   12/20 afebrile, cxr without active disease 12/16    -dc cefepime---> bactrim ds bid continue for now   12/21 -concerns of aspiration now.     -will continue bactrim only for now.    -deep suction, oxygen to help with comfort   12/22: CXR shows no consolidation. 19. Secretions: discussed scopolamine patch, daughter would prefer to initiate outpatient.  20. Disposition: d/c home with hospice tomorrow.                                                          LOS: 24 days A FACE TO FACE EVALUATION WAS PERFORMED  Clide Deutscher Kenith Trickel 02/03/2020, 11:33 AM

## 2020-02-03 NOTE — Plan of Care (Signed)
Functional decline and home with Hospice.

## 2020-02-03 NOTE — Progress Notes (Signed)
[employer]ufacturing [employer] [facility]) Hospital Liaison: RN note     Hospice eligibility has been confirmed. Patient will be admitted to [name] home hospice services after discharge home on Friday 02/04/20. [facility] has a nurse set to visit patient at home at 3pm so it is important we get this patient home. Transport will need to be arranged early.    Please send signed and completed DNR form home with patient/family. Patient will need prescriptions for discharge comfort medications.   Please send patient home with a Foley Catheter.    Patient/family requests the following DME for delivery to the home: Hospital bed with air mattress overlay, hoyer lift and oral suction. Per [name] all DME is in the home.    A Please do not hesitate to call with questions.     Clementeen Hoof, BSN, [facility] (in [place]) [phone]

## 2020-02-03 NOTE — Progress Notes (Signed)
Patient ID: Peter [name], male   DOB: [birth year], 86 y.o.   MRN: [id number]   Per EMR, pt has been accepted to [facility] home hospice once discharged. SW received updates from Melissa/Hospice reporting the family has requested home oxygen at discharge.  SW spoke with [facility] [phone]) to request oxygen. Ambulance transport scheduled for 9am. SW informed pt assigned RN.   [name], MSW, [place] Office: [phone] Cell: [phone] Fax: [phone]

## 2020-02-04 LAB — GLUCOSE, CAPILLARY: Glucose-Capillary: 167 mg/dL — ABNORMAL HIGH (ref 70–99)

## 2020-02-04 MED ORDER — CHLORHEXIDINE GLUCONATE CLOTH 2 % EX PADS
6.0000 | MEDICATED_PAD | Freq: Every day | CUTANEOUS | Status: DC
  Administered 2020-02-04: 6 via TOPICAL

## 2020-02-04 NOTE — Progress Notes (Signed)
[place] PHYSICAL MEDICINE & REHABILITATION PROGRESS NOTE   Subjective/Complaints: No complaints this morning CXR shows no consolidation D/c with home hospice today Systolic elevated and diastolic low.   SWH:QPRFFMB d/t cognition, arousal  Objective:   DG Chest Port 1 View  Result Date: 02/02/2020 CLINICAL DATA:  Cough. EXAM: PORTABLE CHEST 1 VIEW COMPARISON:  January 27, 2020. FINDINGS: The heart size and mediastinal contours are within normal limits. Pulmonary vascular congestion. Coronary stent. Loop recorder projects over the left chest. Enteric tube courses below the diaphragm in outside the field of view. No consolidation. No visible pleural effusions or pneumothorax. No acute osseous abnormality. IMPRESSION: Pulmonary vascular congestion without overt pulmonary edema. No consolidation. Electronically Signed   By: Feliberto Harts MD   On: 02/02/2020 10:57   No results for input(s): WBC, HGB, HCT, PLT in the last 72 hours. No results for input(s): NA, K, CL, CO2, GLUCOSE, BUN, CREATININE, CALCIUM in the last 72 hours.  Intake/Output Summary (Last 24 hours) at 02/04/2020 0917 Last data filed at 02/04/2020 0536 Gross per 24 hour  Intake 10 ml  Output 900 ml  Net -890 ml    Physical Exam: Vital Signs Blood pressure (!) 168/44, pulse 70, temperature 98.8 F (37.1 C), resp. rate 18, height 5\' 7"  (1.702 m), weight 69.2 kg, SpO2 99 %.  Gen: no distress, normal appearing HEENT: oral mucosa pink and moist, NCAT Cardio: Reg rate Chest: normal effort, normal rate of breathing Abd: soft, non-distended Ext: no edema Psych: pleasant but more fatigued appearing today. Skin: redness at sacrum. Necrotic right 2nd toe stable in appearance Musc: No edema in extremities.  No tenderness in extremities. Intrinsic muscle atrophy bilateral hands Neuro: dysarthric speech. Alert at times but then can quickly fall asleep. Left central 7, visual field cut and inattention. Motor: RUE/RLE:  4-4+ /5 proximal distal LUE: 0/5 proximal with inattention. Decreased LT LUE>LLE LLE: 1 to 2/5 proximal to distal with cueing required to trigger  Assessment/Plan: 1. Functional deficits which require 3+ hours per day of interdisciplinary therapy in a comprehensive inpatient rehab setting.  Physiatrist is providing close team supervision and 24 hour management of active medical problems listed below.  Physiatrist and rehab team continue to assess barriers to discharge/monitor patient progress toward functional and medical goals  Care Tool:  Bathing    Body parts bathed by patient: Left arm,Chest,Abdomen,Front perineal area,Right upper leg,Left upper leg,Face   Body parts bathed by helper: Right arm     Bathing assist Assist Level: Moderate Assistance - Patient 50 - 74% (TIS shower chair)     Upper Body Dressing/Undressing Upper body dressing   What is the patient wearing?: Pull over shirt    Upper body assist Assist Level: Maximal Assistance - Patient 25 - 49%    Lower Body Dressing/Undressing Lower body dressing      What is the patient wearing?: Pants     Lower body assist Assist for lower body dressing: Total Assistance - Patient < 25%     Toileting Toileting    Toileting assist Assist for toileting: Dependent - Patient 0%     Transfers Chair/bed transfer  Transfers assist     Chair/bed transfer assist level: 2 Helpers     Locomotion Ambulation   Ambulation assist      Assist level: 2 helpers Assistive device: Parallel bars Max distance: 6 ft   Walk 10 feet activity   Assist     Assist level: 2 helpers Assistive device: Hand held assist  Walk 50 feet activity   Assist Walk 50 feet with 2 turns activity did not occur: Safety/medical concerns  Assist level: 2 helpers Assistive device: Hand held assist    Walk 150 feet activity   Assist Walk 150 feet activity did not occur: Safety/medical concerns         Walk 10 feet on  uneven surface  activity   Assist Walk 10 feet on uneven surfaces activity did not occur: Safety/medical concerns         Wheelchair     Assist Will patient use wheelchair at discharge?: No             Wheelchair 50 feet with 2 turns activity    Assist            Wheelchair 150 feet activity     Assist          Blood pressure (!) 168/44, pulse 70, temperature 98.8 F (37.1 C), resp. rate 18, height 5\' 7"  (1.702 m), weight 69.2 kg, SpO2 99 %.    Medical Problem List and Plan: 1.  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 Involvement of right frontal lobe too.  Appreciate neurology recs.  -plan for home hospice 02/04/20.   -Continue CIR 2.  Antithrombotics: -DVT/anticoagulation: SCDs             -antiplatelet therapy: Aspirin 81 mg daily and Brilinta 90 mg twice daily x3 months then Brilinta alone   -sent in rx for brilinta to pharmacy as hospice apparently doesn't have this med   -neurology recommends the above regimen.  3. Pain Management: Neurontin 300 mg twice daily, Cymbalta 30 mg daily.   -has pain in right foot at night which is ongoing  - scheduled tylenol  12/24: continue oxycodone 2.5mg  daily PRN.  4. Mood: Namenda 10 mg twice daily, Aricept 10 mg daily             -antipsychotic agents: N/A  -12/24 continue ritalin at 10mg  for arousal, initiation 5. Neuropsych: This patient is? capable of making decisions on his own behalf. 6. Skin/Wound Care: Routine skin checks  -kerlix wrap  for right foot to keep ischemic toe from catching  Continue pressure relief for coccyx ulcer 7. Fluids/Electrolytes/Nutrition: family/staff to encourage PO  -12/17 Cortrak inserted 12/15. Tolerating reasonably well    -recent electrolytes ok---recheck Monday   -prealbumin low (pre-TF), 15.9,  but not as bad as I feared   -continue TF/oral feeding as possible  1218- pt ate a little for breakfast- con't to encourage.   12/19-21- TF + 0-50% po intake with meals (TF on hold for now) 8.  Dysphagia.    D2 nectars-->downgraded to D1 nectars   -MBS 12/20 recommended to continue current D1 diet 9.  Hypertension.  Norvasc 10 mg daily, Coreg 6.25 mg twice daily, Cardura 4 mg daily, hydralazine 100 mg 3 times daily, HCTZ 12.5 mg daily, Imdur 60 mg daily.   -controlled Allow some elevation for perfusion  12/21- SBP still a little elevated. No changes to plan 10.  History of prostate cancer.  Continue Proscar 5 mg daily.  -place foley prior to discharge home on 12/24 11.  Diabetes mellitus with hyperglycemia.  Hemoglobin A1c 6.0.    lantus now bid with TF running  12/19- 151-294- increase Lantus- to 18 units  BID   12/24: CBGs uncontrolled: will not increase Lantus further given hospice status.   12.  Diastolic congestive heart failure.  Monitor for any signs of fluid overload   Filed Weights   01/14/20 0600 01/15/20 1005 01/21/20 0300  Weight: 72.2 kg 73.1 kg 69.2 kg   12/14 weight trending down Need new weight today   -pt weighed 71.8 kg at admission to rehab  13.  History of gout.  Allopurinol 100 mg daily.  Monitor for any gout flareups 14.  Acute on chronic anemia with heme + stool.       --transfuse if <7.5  -Continue Protonix 40 mg daily  Transfused 1 unit PRBC on 12/5  Hemoglobin 8.7 on 12/6  GI saw patient on acute and deferred endo/colonoscopy unless gross, significant bleeding  -no gross blood in stool  12/16 transfused 1u for hgb 7.4  12/20 hgb 8.2   15.  Hyperlipidemia.  Lipitor 16.  CAD with PCI.  Continue aspirin and Brilinta for now follow-up outpatient cardiology service.  No current plan for ischemic work-up 17.  CKD stage III.  Follow-up chemistries.  Creatinine 1.42 12/10  12/10 resuming fluids as above 18. Fever. UTI and/or aspiration pneumonia   -UCX + for 100K  klebsiella pneumoniae   -12/16 still have concerns about his chest, aspiration risk    -wbc's are stable at 10.9, afebrile.     -continue cefepime for now   12/20 afebrile, cxr without active disease 12/16    -dc cefepime---> bactrim ds bid continue for now   12/21 -concerns of aspiration now.     -will continue bactrim only for now.    -deep suction, oxygen to help with comfort   12/22: CXR shows no consolidation. 19. Secretions: discussed scopolamine patch, daughter would prefer to initiate outpatient.  20. Disposition: d/c home with hospice today   >30 minutes spent in discharge of patient including review of medications and follow-up appointments, physical examination, reviewing labs and vitals, and in answering all patient's questions                                                         LOS: 25 days A FACE TO FACE EVALUATION WAS PERFORMED  Clide Deutscher Asma Boldon 02/04/2020, 9:17 AM

## 2020-02-04 NOTE — Progress Notes (Signed)
Sacral foam changed prior to transport. Measurements and appearance consistent with WOC note from 12/21. Deep tissue injory noted to outer aspect of Left heel. Allevyn foam initated and Silvestre Mesi PA made aware. Patient discharged via ambulance at about 0915. All morning medications received. Foley bag emptied prior to transport.

## 2020-02-04 NOTE — Progress Notes (Signed)
Indwelling foley catheter Fr16 inserted per MD order. Education provided to the patient prior. 2 Rn assist in foley insertion.

## 2020-02-04 NOTE — Progress Notes (Addendum)
Inpatient Rehabilitation Care Coordinator Discharge Note  The overall goal for the admission was met for:   Discharge location: Yes. D/c to home with [facility]- hospice.   Length of Stay: Yes. 24 days.  Discharge activity level: Yes. Max Asst.  Home/community participation: Yes. Limited.   Services provided included: MD, RD, PT, OT, SLP, RN, CM, TR, Pharmacy, Neuropsych and SW  Financial Services: Medicare  Choices offered to/list presented to: Family preferred for pt to d/c to home with hospice.   Follow-up services arranged: Other: [facility]- upon d/c from hospital plans for pt to be accepted into their hospice program  Comments (or additional information): contact pt dtr [name] #[phone]  Patient/Family verbalized understanding of follow-up arrangements: Yes  Individual responsible for coordination of the follow-up plan: Pt to have assistance   Confirmed correct DME delivered:  A  02/04/2020     A  

## 2020-02-09 ENCOUNTER — Telehealth: Payer: Self-pay | Admitting: Family Medicine

## 2020-02-12 NOTE — Telephone Encounter (Signed)
Carla from [facility] called and stated that he passed today at 11:27 AM

## 2020-02-12 DEATH — deceased

## 2020-02-15 ENCOUNTER — Ambulatory Visit: Payer: Self-pay | Admitting: Urology

## 2020-02-23 ENCOUNTER — Ambulatory Visit: Payer: Medicare Other | Admitting: Internal Medicine

## 2020-02-27 ENCOUNTER — Other Ambulatory Visit: Payer: Self-pay | Admitting: Internal Medicine

## 2020-03-02 ENCOUNTER — Inpatient Hospital Stay: Payer: Medicare Other | Admitting: Physical Medicine and Rehabilitation

## 2020-03-29 ENCOUNTER — Other Ambulatory Visit: Payer: Self-pay

## 2020-03-29 NOTE — Patient Outreach (Signed)
[facility]) Care Management  03/29/2020  Khoen Genet 07-30-1930 [phone]   Telephone outreach to patient to obtain mRS was unsuccessfully completed as patient is deceased. MRS=6  Thank you, [place] Care Management Assistant
